# Patient Record
Sex: Female | Born: 1960 | Race: White | Hispanic: No | Smoking: Never smoker
Health system: Southern US, Community
[De-identification: ages and names within clinical notes are randomized; demographics above are authoritative.]

## PROBLEM LIST (undated history)

## (undated) DIAGNOSIS — M1990 Unspecified osteoarthritis, unspecified site: Secondary | ICD-10-CM

## (undated) DIAGNOSIS — K219 Gastro-esophageal reflux disease without esophagitis: Secondary | ICD-10-CM

## (undated) DIAGNOSIS — T781XXA Other adverse food reactions, not elsewhere classified, initial encounter: Secondary | ICD-10-CM

## (undated) DIAGNOSIS — J441 Chronic obstructive pulmonary disease with (acute) exacerbation: Secondary | ICD-10-CM

## (undated) DIAGNOSIS — J301 Allergic rhinitis due to pollen: Secondary | ICD-10-CM

## (undated) DIAGNOSIS — I10 Essential (primary) hypertension: Secondary | ICD-10-CM

## (undated) DIAGNOSIS — B029 Zoster without complications: Secondary | ICD-10-CM

## (undated) DIAGNOSIS — R21 Rash and other nonspecific skin eruption: Secondary | ICD-10-CM

## (undated) DIAGNOSIS — E039 Hypothyroidism, unspecified: Secondary | ICD-10-CM

## (undated) DIAGNOSIS — T7819XA Other adverse food reactions, not elsewhere classified, initial encounter: Secondary | ICD-10-CM

## (undated) DIAGNOSIS — M545 Low back pain, unspecified: Secondary | ICD-10-CM

## (undated) DIAGNOSIS — G4730 Sleep apnea, unspecified: Secondary | ICD-10-CM

## (undated) DIAGNOSIS — T783XXA Angioneurotic edema, initial encounter: Secondary | ICD-10-CM

## (undated) DIAGNOSIS — G2581 Restless legs syndrome: Secondary | ICD-10-CM

## (undated) DIAGNOSIS — M67441 Ganglion, right hand: Secondary | ICD-10-CM

## (undated) DIAGNOSIS — U071 COVID-19: Secondary | ICD-10-CM

## (undated) DIAGNOSIS — E7800 Pure hypercholesterolemia, unspecified: Secondary | ICD-10-CM

## (undated) DIAGNOSIS — K760 Fatty (change of) liver, not elsewhere classified: Secondary | ICD-10-CM

## (undated) DIAGNOSIS — H9190 Unspecified hearing loss, unspecified ear: Secondary | ICD-10-CM

## (undated) DIAGNOSIS — J45909 Unspecified asthma, uncomplicated: Secondary | ICD-10-CM

## (undated) DIAGNOSIS — I8391 Asymptomatic varicose veins of right lower extremity: Secondary | ICD-10-CM

## (undated) DIAGNOSIS — E669 Obesity, unspecified: Secondary | ICD-10-CM

## (undated) DIAGNOSIS — T782XXD Anaphylactic shock, unspecified, subsequent encounter: Secondary | ICD-10-CM

## (undated) HISTORY — DX: Rash and other nonspecific skin eruption: R21

## (undated) HISTORY — DX: Anaphylactic shock, unspecified, subsequent encounter: T78.2XXD

## (undated) HISTORY — PX: CHOLECYSTECTOMY: SHX55

## (undated) HISTORY — DX: Unspecified asthma, uncomplicated: J45.909

## (undated) HISTORY — DX: COVID-19: U07.1

## (undated) HISTORY — DX: Zoster without complications: B02.9

## (undated) HISTORY — PX: CYST REMOVAL LEG: SHX6280

## (undated) HISTORY — DX: Other adverse food reactions, not elsewhere classified, initial encounter: T78.1XXA

## (undated) HISTORY — DX: Ganglion, right hand: M67.441

## (undated) HISTORY — DX: Gastro-esophageal reflux disease without esophagitis: K21.9

## (undated) HISTORY — PX: TOTAL ABDOMINAL HYSTERECTOMY: SHX209

## (undated) HISTORY — DX: Fatty (change of) liver, not elsewhere classified: K76.0

## (undated) HISTORY — DX: Sleep apnea, unspecified: G47.30

## (undated) HISTORY — DX: Low back pain, unspecified: M54.50

## (undated) HISTORY — DX: Obesity, unspecified: E66.9

## (undated) HISTORY — DX: Angioneurotic edema, initial encounter: T78.3XXA

## (undated) HISTORY — DX: Essential (primary) hypertension: I10

## (undated) HISTORY — DX: Allergic rhinitis due to pollen: J30.1

## (undated) HISTORY — DX: Chronic obstructive pulmonary disease with (acute) exacerbation: J44.1

## (undated) HISTORY — DX: Other adverse food reactions, not elsewhere classified, initial encounter: T78.19XA

## (undated) HISTORY — DX: Asymptomatic varicose veins of right lower extremity: I83.91

## (undated) HISTORY — PX: ABDOMINAL HYSTERECTOMY: SHX81

## (undated) HISTORY — PX: MIDDLE EAR SURGERY: SHX713

---
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
## 1998-03-04 ENCOUNTER — Ambulatory Visit (HOSPITAL_COMMUNITY): Admission: RE | Admit: 1998-03-04 | Discharge: 1998-03-04 | Payer: Self-pay | Admitting: *Deleted

## 1998-03-04 ENCOUNTER — Encounter: Payer: Self-pay | Admitting: *Deleted

## 1998-03-08 ENCOUNTER — Ambulatory Visit (HOSPITAL_COMMUNITY): Admission: RE | Admit: 1998-03-08 | Discharge: 1998-03-08 | Payer: Self-pay | Admitting: Infectious Diseases

## 1998-03-08 ENCOUNTER — Encounter: Admission: RE | Admit: 1998-03-08 | Discharge: 1998-03-08 | Payer: Self-pay | Admitting: Infectious Diseases

## 1998-03-09 ENCOUNTER — Other Ambulatory Visit: Admission: RE | Admit: 1998-03-09 | Discharge: 1998-03-09 | Payer: Self-pay | Admitting: Otolaryngology

## 1998-03-11 ENCOUNTER — Ambulatory Visit (HOSPITAL_COMMUNITY): Admission: RE | Admit: 1998-03-11 | Discharge: 1998-03-11 | Payer: Self-pay | Admitting: Infectious Diseases

## 1998-03-11 ENCOUNTER — Encounter: Admission: RE | Admit: 1998-03-11 | Discharge: 1998-03-11 | Payer: Self-pay | Admitting: Infectious Diseases

## 1998-03-17 ENCOUNTER — Encounter: Admission: RE | Admit: 1998-03-17 | Discharge: 1998-03-17 | Payer: Self-pay | Admitting: Infectious Diseases

## 2001-01-18 ENCOUNTER — Emergency Department (HOSPITAL_COMMUNITY): Admission: EM | Admit: 2001-01-18 | Discharge: 2001-01-18 | Payer: Self-pay | Admitting: Emergency Medicine

## 2001-01-18 ENCOUNTER — Encounter: Payer: Self-pay | Admitting: Emergency Medicine

## 2001-05-14 ENCOUNTER — Ambulatory Visit (HOSPITAL_COMMUNITY): Admission: RE | Admit: 2001-05-14 | Discharge: 2001-05-14 | Payer: Self-pay | Admitting: Pulmonary Disease

## 2002-10-29 ENCOUNTER — Ambulatory Visit (HOSPITAL_COMMUNITY): Admission: RE | Admit: 2002-10-29 | Discharge: 2002-10-29 | Payer: Self-pay | Admitting: Pulmonary Disease

## 2003-01-18 ENCOUNTER — Ambulatory Visit: Admission: RE | Admit: 2003-01-18 | Discharge: 2003-01-18 | Payer: Self-pay | Admitting: Orthopedic Surgery

## 2003-01-18 ENCOUNTER — Encounter: Payer: Self-pay | Admitting: Orthopedic Surgery

## 2003-03-03 ENCOUNTER — Encounter: Payer: Self-pay | Admitting: Emergency Medicine

## 2003-03-03 ENCOUNTER — Emergency Department (HOSPITAL_COMMUNITY): Admission: EM | Admit: 2003-03-03 | Discharge: 2003-03-03 | Payer: Self-pay | Admitting: Emergency Medicine

## 2003-03-18 ENCOUNTER — Ambulatory Visit (HOSPITAL_COMMUNITY): Admission: RE | Admit: 2003-03-18 | Discharge: 2003-03-18 | Payer: Self-pay | Admitting: Pulmonary Disease

## 2003-09-29 ENCOUNTER — Ambulatory Visit (HOSPITAL_COMMUNITY): Admission: RE | Admit: 2003-09-29 | Discharge: 2003-09-29 | Payer: Self-pay | Admitting: Pulmonary Disease

## 2003-11-02 ENCOUNTER — Ambulatory Visit (HOSPITAL_COMMUNITY): Admission: RE | Admit: 2003-11-02 | Discharge: 2003-11-02 | Payer: Self-pay | Admitting: Pulmonary Disease

## 2004-02-26 ENCOUNTER — Emergency Department (HOSPITAL_COMMUNITY): Admission: EM | Admit: 2004-02-26 | Discharge: 2004-02-26 | Payer: Self-pay | Admitting: Emergency Medicine

## 2004-07-03 ENCOUNTER — Ambulatory Visit (HOSPITAL_COMMUNITY): Admission: RE | Admit: 2004-07-03 | Discharge: 2004-07-03 | Payer: Self-pay | Admitting: Pulmonary Disease

## 2004-11-03 ENCOUNTER — Ambulatory Visit (HOSPITAL_COMMUNITY): Admission: RE | Admit: 2004-11-03 | Discharge: 2004-11-03 | Payer: Self-pay | Admitting: Pulmonary Disease

## 2005-06-11 ENCOUNTER — Encounter: Payer: Self-pay | Admitting: Obstetrics and Gynecology

## 2005-06-11 ENCOUNTER — Inpatient Hospital Stay (HOSPITAL_COMMUNITY): Admission: RE | Admit: 2005-06-11 | Discharge: 2005-06-13 | Payer: Self-pay | Admitting: Obstetrics and Gynecology

## 2005-11-06 ENCOUNTER — Ambulatory Visit (HOSPITAL_COMMUNITY): Admission: RE | Admit: 2005-11-06 | Discharge: 2005-11-06 | Payer: Self-pay | Admitting: Pulmonary Disease

## 2006-06-09 ENCOUNTER — Emergency Department (HOSPITAL_COMMUNITY): Admission: EM | Admit: 2006-06-09 | Discharge: 2006-06-09 | Payer: Self-pay | Admitting: Emergency Medicine

## 2006-06-12 ENCOUNTER — Ambulatory Visit: Payer: Self-pay | Admitting: Orthopedic Surgery

## 2006-06-17 ENCOUNTER — Ambulatory Visit (HOSPITAL_COMMUNITY): Admission: RE | Admit: 2006-06-17 | Discharge: 2006-06-17 | Payer: Self-pay | Admitting: Orthopedic Surgery

## 2006-06-24 ENCOUNTER — Ambulatory Visit: Payer: Self-pay | Admitting: Orthopedic Surgery

## 2006-07-26 ENCOUNTER — Ambulatory Visit: Payer: Self-pay | Admitting: Orthopedic Surgery

## 2006-07-26 ENCOUNTER — Ambulatory Visit (HOSPITAL_COMMUNITY): Admission: RE | Admit: 2006-07-26 | Discharge: 2006-07-26 | Payer: Self-pay | Admitting: Orthopedic Surgery

## 2006-07-26 ENCOUNTER — Encounter (INDEPENDENT_AMBULATORY_CARE_PROVIDER_SITE_OTHER): Payer: Self-pay | Admitting: Specialist

## 2006-07-29 ENCOUNTER — Ambulatory Visit: Payer: Self-pay | Admitting: Orthopedic Surgery

## 2006-08-06 ENCOUNTER — Ambulatory Visit: Payer: Self-pay | Admitting: Orthopedic Surgery

## 2006-11-11 ENCOUNTER — Ambulatory Visit (HOSPITAL_COMMUNITY): Admission: RE | Admit: 2006-11-11 | Discharge: 2006-11-11 | Payer: Self-pay | Admitting: Pulmonary Disease

## 2007-02-28 ENCOUNTER — Ambulatory Visit (HOSPITAL_COMMUNITY): Admission: RE | Admit: 2007-02-28 | Discharge: 2007-02-28 | Payer: Self-pay | Admitting: Pulmonary Disease

## 2007-11-11 ENCOUNTER — Emergency Department (HOSPITAL_COMMUNITY): Admission: EM | Admit: 2007-11-11 | Discharge: 2007-11-11 | Payer: Self-pay | Admitting: Emergency Medicine

## 2007-11-12 ENCOUNTER — Ambulatory Visit (HOSPITAL_COMMUNITY): Admission: RE | Admit: 2007-11-12 | Discharge: 2007-11-12 | Payer: Self-pay | Admitting: Pulmonary Disease

## 2008-08-27 ENCOUNTER — Encounter: Payer: Self-pay | Admitting: Orthopedic Surgery

## 2008-09-14 ENCOUNTER — Ambulatory Visit (HOSPITAL_COMMUNITY): Admission: RE | Admit: 2008-09-14 | Discharge: 2008-09-14 | Payer: Self-pay | Admitting: Pulmonary Disease

## 2008-11-15 ENCOUNTER — Ambulatory Visit (HOSPITAL_COMMUNITY): Admission: RE | Admit: 2008-11-15 | Discharge: 2008-11-15 | Payer: Self-pay

## 2008-11-25 ENCOUNTER — Emergency Department (HOSPITAL_COMMUNITY): Admission: EM | Admit: 2008-11-25 | Discharge: 2008-11-25 | Payer: Self-pay | Admitting: Emergency Medicine

## 2008-12-09 ENCOUNTER — Emergency Department (HOSPITAL_COMMUNITY): Admission: EM | Admit: 2008-12-09 | Discharge: 2008-12-10 | Payer: Self-pay | Admitting: Emergency Medicine

## 2009-06-12 ENCOUNTER — Emergency Department (HOSPITAL_COMMUNITY): Admission: EM | Admit: 2009-06-12 | Discharge: 2009-06-12 | Payer: Self-pay | Admitting: Emergency Medicine

## 2009-06-13 ENCOUNTER — Ambulatory Visit (HOSPITAL_COMMUNITY): Admission: RE | Admit: 2009-06-13 | Discharge: 2009-06-13 | Payer: Self-pay | Admitting: Pulmonary Disease

## 2009-11-01 ENCOUNTER — Emergency Department (HOSPITAL_COMMUNITY): Admission: EM | Admit: 2009-11-01 | Discharge: 2009-11-01 | Payer: Self-pay | Admitting: Emergency Medicine

## 2009-11-17 ENCOUNTER — Ambulatory Visit (HOSPITAL_COMMUNITY): Admission: RE | Admit: 2009-11-17 | Discharge: 2009-11-17 | Payer: Self-pay | Admitting: Pulmonary Disease

## 2010-03-01 ENCOUNTER — Emergency Department (HOSPITAL_COMMUNITY): Admission: EM | Admit: 2010-03-01 | Discharge: 2010-03-02 | Payer: Self-pay | Admitting: Emergency Medicine

## 2010-05-15 ENCOUNTER — Ambulatory Visit (HOSPITAL_COMMUNITY): Admission: RE | Admit: 2010-05-15 | Payer: Self-pay | Source: Home / Self Care | Admitting: Pulmonary Disease

## 2010-06-25 ENCOUNTER — Encounter: Payer: Self-pay | Admitting: Pulmonary Disease

## 2010-08-17 LAB — DIFFERENTIAL
Basophils Relative: 0 % (ref 0–1)
Eosinophils Absolute: 0.1 10*3/uL (ref 0.0–0.7)
Lymphocytes Relative: 28 % (ref 12–46)
Lymphs Abs: 1.6 10*3/uL (ref 0.7–4.0)
Monocytes Absolute: 0.3 10*3/uL (ref 0.1–1.0)
Monocytes Relative: 6 % (ref 3–12)
Neutro Abs: 3.9 10*3/uL (ref 1.7–7.7)
Neutrophils Relative %: 65 % (ref 43–77)

## 2010-08-17 LAB — CBC
Hemoglobin: 13.3 g/dL (ref 12.0–15.0)
MCHC: 33.3 g/dL (ref 30.0–36.0)
Platelets: 219 10*3/uL (ref 150–400)
RBC: 4.56 MIL/uL (ref 3.87–5.11)
RDW: 14.6 % (ref 11.5–15.5)
WBC: 6 10*3/uL (ref 4.0–10.5)

## 2010-08-17 LAB — BASIC METABOLIC PANEL
CO2: 27 mEq/L (ref 19–32)
Calcium: 9.6 mg/dL (ref 8.4–10.5)
GFR calc Af Amer: >60 mL/min (ref >60)
GFR calc non Af Amer: >60 mL/min (ref >60)
Potassium: 3.9 mEq/L (ref 3.5–5.1)

## 2010-09-10 LAB — DIFFERENTIAL
Eosinophils Absolute: 0.1 10*3/uL (ref 0.0–0.7)
Lymphocytes Relative: 28 % (ref 12–46)
Monocytes Relative: 6 % (ref 3–12)
Neutro Abs: 3.9 10*3/uL (ref 1.7–7.7)

## 2010-09-10 LAB — POCT CARDIAC MARKERS: Troponin i, poc: <0.05 ng/mL (ref 0.00–0.09)

## 2010-09-10 LAB — CBC
MCV: 87.5 fL (ref 78.0–100.0)
RDW: 14.6 % (ref 11.5–15.5)
WBC: 6 10*3/uL (ref 4.0–10.5)

## 2010-09-10 LAB — BASIC METABOLIC PANEL: GFR calc Af Amer: >60 mL/min (ref >60)

## 2010-09-11 LAB — BASIC METABOLIC PANEL
BUN: 12 mg/dL (ref 6–23)
CO2: 28 mEq/L (ref 19–32)
Calcium: 9.3 mg/dL (ref 8.4–10.5)
Chloride: 103 mEq/L (ref 96–112)
Creatinine, Ser: 0.72 mg/dL (ref 0.4–1.2)
GFR calc Af Amer: >60 mL/min (ref >60)
Sodium: 136 mEq/L (ref 135–145)

## 2010-09-11 LAB — CBC
HCT: 39.5 % (ref 36.0–46.0)
Hemoglobin: 13.5 g/dL (ref 12.0–15.0)
MCHC: 34.3 g/dL (ref 30.0–36.0)
MCV: 88 fL (ref 78.0–100.0)
RBC: 4.49 MIL/uL (ref 3.87–5.11)
RDW: 14.6 % (ref 11.5–15.5)
WBC: 5.3 10*3/uL (ref 4.0–10.5)

## 2010-09-11 LAB — URINALYSIS, ROUTINE W REFLEX MICROSCOPIC
Bilirubin Urine: NEGATIVE
Hgb urine dipstick: NEGATIVE
Urobilinogen, UA: 0.2 mg/dL (ref 0.0–1.0)
pH: 6.5 (ref 5.0–8.0)

## 2010-09-11 LAB — DIFFERENTIAL
Basophils Absolute: 0 10*3/uL (ref 0.0–0.1)
Basophils Relative: 0 % (ref 0–1)
Eosinophils Absolute: 0.1 10*3/uL (ref 0.0–0.7)
Eosinophils Relative: 1 % (ref 0–5)
Lymphocytes Relative: 24 % (ref 12–46)
Lymphs Abs: 1.3 10*3/uL (ref 0.7–4.0)
Monocytes Absolute: 0.4 10*3/uL (ref 0.1–1.0)
Monocytes Relative: 8 % (ref 3–12)

## 2010-09-26 ENCOUNTER — Other Ambulatory Visit (HOSPITAL_COMMUNITY): Payer: Self-pay | Admitting: Pulmonary Disease

## 2010-09-26 DIAGNOSIS — R221 Localized swelling, mass and lump, neck: Secondary | ICD-10-CM

## 2010-10-02 ENCOUNTER — Ambulatory Visit (HOSPITAL_COMMUNITY)
Admission: RE | Admit: 2010-10-02 | Discharge: 2010-10-02 | Disposition: A | Discharge status: Home / Self Care | Payer: Medicaid Other | Source: Ambulatory Visit | Attending: Pulmonary Disease | Admitting: Pulmonary Disease

## 2010-10-02 ENCOUNTER — Encounter (HOSPITAL_COMMUNITY): Payer: Self-pay

## 2010-10-02 DIAGNOSIS — R221 Localized swelling, mass and lump, neck: Secondary | ICD-10-CM

## 2010-10-02 DIAGNOSIS — R220 Localized swelling, mass and lump, head: Secondary | ICD-10-CM | POA: Insufficient documentation

## 2010-10-02 HISTORY — DX: Essential (primary) hypertension: I10

## 2010-10-02 MED ORDER — IOHEXOL 300 MG/ML  SOLN
75.0000 mL | Freq: Once | INTRAMUSCULAR | Status: AC | PRN
  Administered 2010-10-02: 75 mL via INTRAVENOUS

## 2010-10-16 ENCOUNTER — Other Ambulatory Visit (HOSPITAL_COMMUNITY): Payer: Self-pay | Admitting: Pulmonary Disease

## 2010-10-16 DIAGNOSIS — Z139 Encounter for screening, unspecified: Secondary | ICD-10-CM

## 2010-10-20 NOTE — Discharge Summary (Signed)
[name], Sandy Valencia                ACCOUNT NO.:  [id number]   MEDICAL RECORD NO.:  [id number]          PATIENT TYPE:  INP   LOCATION:  A418                          FACILITY:  [facility]   PHYSICIAN:  Tilda Burrow, M.D. DATE OF BIRTH:  1960-12-28   DATE OF ADMISSION:  06/11/2005  DATE OF DISCHARGE:  01/10/2007LH                                 DISCHARGE SUMMARY   ADMISSION DIAGNOSES:  1.  Uterine fibroids.  2.  Menorrhagia.  3.  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  4.  Asthma.  5.  GERD.   DISCHARGE DIAGNOSES:  1.  Uterine fibroids.  2.  Menorrhagia.  3.  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  4.  Asthma.  5.  GERD.  6.  Bilateral ovarian tumor mucinous cyst adenoma.   PROCEDURES:  On June 11, 2005, total abdominal hysterectomy,  panniculectomy with repositioning of the umbilicus.   DISCHARGE MEDICATIONS:  1.  Actos 30 mg one p.o. daily.  2.  Zantac 30 mg p.o. daily.  3.  Glucovance one p.o. daily.  Check blood sugars twice daily.   FOLLOWUP:  Follow up with Dr. Juanetta Gosling in one week and  in our office in one  week.   HOSPITAL SUMMARY:  This 50 year old female diabetic with diabetes and  hypertension in the past having lost 40 pounds while preparing for surgery  is admitted for hysterectomy and panniculectomy.   HOSPITAL COURSE:  The patient was admitted, underwent hysterectomy with  removal of cervix without complications.  She was managed postoperatively  with sliding scale insulin as per diabetic protocol.  She has had  Flowtron__anti thrombus Compression _ devices in place.  She had a  relatively straightforward postoperative course.  She was begun on Lantus 20  units at bedtime on postop day #1.  She was discharged on postop day #10,  managed by Dr. Juanetta Gosling for her diabetes consisting of return to oral agents  Actos and Glucovance.  Follow up is in one week in our office for staple  removal and evaluation of drains.   ADDENDUM:  Pathology report showed a 430 g uterus with 68 g tubes and  ovaries.  The anterior abdominal wall section measured 60x23x7.5 cm full  thickness skin ellipsis with underlying subcutaneous tissue.  The weight was  4300 g after fixation.  Pathology report showed only mild dysplasia of the  cervix, bilateral mucinous cyst adenomatous of the ovaries.      Tilda Burrow, M.D.  Electronically Signed     JVF/MEDQ  D:  07/04/2005  T:  07/05/2005  Job:  [id number]   cc:   Ramon Dredge L. Juanetta Gosling, M.D.  Fax: [phone]

## 2010-10-20 NOTE — H&P (Signed)
Sandy Valencia, TSCHIRHART                ACCOUNT NO.:  [id number]   MEDICAL RECORD NO.:  [id number]          PATIENT TYPE:  AMB   LOCATION:  DAY                           FACILITY:  [facility]   PHYSICIAN:  Vickki Hearing, M.D.DATE OF BIRTH:  March 17, 1961   DATE OF ADMISSION:  DATE OF DISCHARGE:  LH                              HISTORY & PHYSICAL   CHIEF COMPLAINT:  Mass, left foot.   HISTORY OF PRESENT ILLNESS:  This is a 50 year old female who has a  painful mass on the dorsum of the left foot for approximately a month  and half.  An MRI was done to evaluate and it was thought to be a  ganglion.  The patient has trouble wearing shoes, swelling and she  walks.  The cyst appears to be coming out of the tarsal sinus along the  talar head.  No malignancy identified.   REVIEW OF SYSTEMS:  Review of systems times 10 is normal.   PAST MEDICAL HISTORY:  No major illnesses.  She has had ear surgery,  cholecystectomy, tubal ligation, a wart removed from her tongue.   MEDICATIONS:  Her medications are Singulair, Avapro, Glucovance, Zantac,  Xenical, baby aspirin.   FAMILY HISTORY:  Heart, lung disease, asthma, arthritis, cancer.   SOCIAL HISTORY:  She is single, does not smoke or drink, completed  grades 1-12.   PHYSICAL EXAMINATION:  VITAL SIGNS:  Vital signs will be recorded at the  time of her preoperative evaluation.  CONSTITUTIONAL:  Findings include normal development, nutrition,  grooming, hygiene normal.  Body habitus moderate size.  EXTREMITIES:  Peripheral pulses are normal.  Good perfusion. Skin is  normal except for the mass see below.  Range of motion of the foot is  normal.  There is no muscle atrophy.  Good muscle tone.  No instability  at the ankle.  On the dorsal lateral side of the foot there is a mass  palpable under the skin, tender, mobile and soft.  NEUROPSYCH:  Normal sensation.  She is alert and oriented x3.   CLINICAL DATA:  MRI findings as stated.   IMPRESSION:   Dorsal ganglion left foot.   PLAN:  Excision of mass, left foot.      Vickki Hearing, M.D.  Electronically Signed     SEH/MEDQ  D:  07/25/2006  T:  07/25/2006  Job:  [id number]   cc:   [facility]  Fax: [phone]

## 2010-10-20 NOTE — H&P (Signed)
Sandy Valencia, Sandy Valencia                ACCOUNT NO.:  [id number]   MEDICAL RECORD NO.:  [id number]          PATIENT TYPE:  AMB   LOCATION:  DAY                           FACILITY:  [facility]   PHYSICIAN:  Tilda Burrow, M.D. DATE OF BIRTH:  Feb 16, 1961   DATE OF ADMISSION:  DATE OF DISCHARGE:  LH                                HISTORY & PHYSICAL   ADMISSION DIAGNOSES:  1.  Uterine fibroids, menorrhagia secondary to uterine fibroids.  2.  Obesity with panniculus, status post 40-pound weight loss.  3.  Asthma.  4.  Gastroesophageal reflux disease.   HISTORY OF PRESENT ILLNESS:  This 50 year old female is admitted at this  time for hysterectomy with panniculectomy and relocation of the naval. Sandy Valencia  has a huge fibroid deforming the uterine cavity. She is not a candidate for  vaginal procedure. She has lost approximately 40 pounds doing an exercise  program, taking her weight from 250 to 213. She is at this time scheduled  for hysterectomy. We will at that time additionally be able to do a  panniculectomy. We have sketched out the extent of the panniculectomy which  will be quite generous, removing the bulk of the skin and underlying fatty  tissue from the naval to the lower abdominal crease. Sandy Valencia is aware that  there is an increased risk of infection with larger incisions and  acknowledged this as an inherent part of the risk of the surgery, and she  wishes to proceed nonetheless. Her diabetes has been under excellent control  with hemoglobin A1c in the 6 to 7 range.   PAST MEDICAL HISTORY:  Positive for diabetes and hypertension managed by Dr.  Juanetta Gosling.   PAST SURGICAL HISTORY:  1.  Ear surgery 1992 and 1995.  2.  Tubal ligation in 1986.  3.  Cholecystectomy in 1995.   ALLERGIES:  None.   MEDICATIONS:  1.  Actos 30 mg 1 p.o. daily.  2.  Zantac twice daily.  3.  Singulair 10 mg daily.   Pharmacy is [facility].   PHYSICAL EXAMINATION:  GENERAL:  Shows a healthy appearing  Caucasian female,  highly energetic, with multiple questions which have been answered to  patient's satisfaction.  HEENT:  Pupils are equal, round, and reactive. Extraocular movements intact.  NECK:  Supple. Normal thyroid.  CARDIOVASCULAR:  Unremarkable.  BREASTS:  Negative at last check.  ABDOMEN:  Obese with lax skin. No hernia on her abdominal check. She has a  two-roll panniculus. Plans are to relocate the navel from the lower portion  of the upper roll to the upper crease, a more physiologic appearing position  once the lower roll is excised.   External genitalia multiparous. Uterus anterior with 7.8 x 7.5 cm fibroid,  increasing in size since last year. Adnexa are nontender without masses. On  ultrasound, there is no enlargement, so plans are to preserve the ovaries.  Rectal support is adequate.   GC and chlamydia culture have been done May 31, 2005. Pap smear as  well.   PLAN:  Abdominal hysterectomy with ovarian presentation, removing the  cervix,  as well as panniculectomy with naval recollection on June 07, 2004.      Tilda Burrow, M.D.  Electronically Signed     JVF/MEDQ  D:  05/31/2005  T:  05/31/2005  Job:  [id number]   cc:   Ramon Dredge L. Juanetta Gosling, M.D.  Fax: [phone]

## 2010-10-20 NOTE — Op Note (Signed)
NAMESAGRARIO, LINEBERRY                ACCOUNT NO.:  [id number]   MEDICAL RECORD NO.:  [id number]          PATIENT TYPE:  AMB   LOCATION:  DAY                           FACILITY:  [facility]   PHYSICIAN:  Vickki Hearing, M.D.DATE OF BIRTH:  Feb 26, 1961   DATE OF PROCEDURE:  07/26/2006  DATE OF DISCHARGE:                               OPERATIVE REPORT   PREOPERATIVE DIAGNOSIS:  Ganglion cyst left foot.   POSTOPERATIVE DIAGNOSIS:  Ganglion cyst left foot.   PROCEDURE:  Excision of ganglion left foot.   SURGEON:  Vickki Hearing, M.D.   ASSISTANT:  No assistants.   ANESTHETIC:  MAC with local lidocaine 1% plain.   OPERATIVE FINDINGS:  Ganglion cyst coming from the calcaneocuboid joint.   SPECIMEN:  One sent to pathology as a ganglion.   INDICATIONS FOR PROCEDURE:  Persistent pain and trouble wearing shoes.   The patient was identified in the preop holding area as Sandy Valencia.  She marked the mass is the surgical site, countersigned by the surgeon.  Antibiotics were started.  History and physical was updated.  The  patient was taken to the operating room given IV sedation and then left  foot was prepped and draped using sterile technique.  Block was done  using 1% plain lidocaine total of 30 mL.  We first started with a  superficial peroneal nerve block then a local block in a V-shaped  fashion.  We allowed this to set up and then elevated the tourniquet to  300 mmHg made a transverse incision in Langer's lines over the mass,  divided the subcu tissue down to the mass.  We bluntly dissected around  the mass and then excised it and took a little bit of bone from each  side of the calcaneocuboid joint, irrigated and closed with 3-0 nylon,  wrapped a sterile dressing around the foot and then took the patient  back to recovery room in stable condition.  She will wear a Darco shoe,  weightbearing as tolerated.  Follow-up on Monday.  Discharge on Lortab.      Vickki Hearing,  M.D.  Electronically Signed    SEH/MEDQ  D:  07/26/2006  T:  07/26/2006  Job:  [id number]

## 2010-10-20 NOTE — Consult Note (Signed)
Sandy Valencia, Sandy Valencia                ACCOUNT NO.:  [id number]   MEDICAL RECORD NO.:  [id number]          PATIENT TYPE:  INP   LOCATION:  A418                          FACILITY:  [facility]   PHYSICIAN:  Edward L. Juanetta Gosling, M.D.DATE OF BIRTH:  1960/07/29   DATE OF CONSULTATION:  06/13/2005  DATE OF DISCHARGE:  06/13/2005                                   CONSULTATION   MEDICINE CONSULTATION:   HISTORY:  Ms. Tech is a 50 year old who underwent hysterectomy and  abdominoplasty and had done fairly well with that. However, her hemoglobin  A1c when she came in the hospital was 8. She has recently been dieting, has  lost about 30 pounds and thought that her blood sugar was good enough that  she could come off of her medication. She had been on Actos 30 mg daily plus  on Glucovance 5/500 four daily. She stopped all of these except she was  taking her Actos p.r.n. In addition to that she has a history of  hypertension for which she had been on Avapro 150 mg daily, asthma using  albuterol and Atrovent as needed and Singulair as needed but she really has  done very well with her asthma, chronic low back pain was unchanged and then  problems with her pelvis that Dr. Emelda Fear has done surgery for. Her past  medical history otherwise pretty much negative.   SOCIAL HISTORY:  She has a previous history of smoking. She does not smoke  now. She does not drink any alcohol.   FAMILY HISTORY:  Her family history is very positive for diabetes, COPD,  hypertension, and coronary artery occlusive disease.   REVIEW OF SYSTEMS:  Her review of systems except as mentioned is negative.   PHYSICAL EXAMINATION:  GENERAL:  Well-developed, well-nourished female who  is in no acute distress right now.  CHEST:  Clear.  HEART:  Regular.  ABDOMEN:  Soft.  EXTREMITIES:  No edema.  CNS:  Grossly intact.  VITALS:  Her vitals are as recorded.  HEENT:  Her nose and throat are clear. Mucous membranes are moist.   ASSESSMENT:  She has significant problems with her blood sugar and at this  point she is going to need to get back on medications. I think we can cut  the dose down on her medications and I am going to put her on the Glucovance  but only 5/500 once a day. I am going to have her take Actos 30 mg daily and  I am going to have her follow up in  my office in about 6 weeks for reevaluation. I am hopeful that she may be  able to come off some of the medication at that point but it is not totally  clear that will be possible considering that she has been working very hard  at diet and exercise and she still has a hemoglobin A1c that is 8. Thanks  again for allowing me to see her with you.      Edward L. Juanetta Gosling, M.D.  Electronically Signed     ELH/MEDQ  D:  06/13/2005  T:  06/13/2005  Job:  [id number]

## 2010-10-20 NOTE — Op Note (Signed)
Sandy Valencia, Sandy Valencia                ACCOUNT NO.:  [id number]   MEDICAL RECORD NO.:  [id number]          PATIENT TYPE:  INP   LOCATION:  A418                          FACILITY:  [facility]   PHYSICIAN:  Tilda Burrow, M.D. DATE OF BIRTH:  Feb 14, 1961   DATE OF PROCEDURE:  06/11/2005  DATE OF DISCHARGE:                                 OPERATIVE REPORT   PREOPERATIVE DIAGNOSES:  1.  Uterine fibroids with heavy menses.  2.  Obesity with panniculus.   POSTOPERATIVE DIAGNOSES:  1.  Uterine fibroids with heavy menses.  2.  Obesity with panniculus.  3.  Left ovarian fibroma.   PROCEDURE:  Total abdominal hysterectomy, bilateral salpingo-oophorectomy,  panniculectomy with repositioning of the naval.   SURGEON:  Dr. Emelda Fear.   ASSISTANT:  Amie Critchley, C.S.T.-F.A., Morrie Sheldon, R.N.   ANESTHESIA:  General.   COMPLICATIONS:  None.   FINDINGS:  1.  Marked abdominal laxity with redundant skin and fatty tissue.  2.  Small amount of anterior omental adhesions, prior lower abdominal      surgical site.  3.  Large irregular left ovary felt to represent an old fibroma and a      suspected smaller fibroma on the right ovary, warranting removal of both      ovaries and tubes. Evidence of prior tubal sterilization.   DETAILS OF PROCEDURE:  The patient was taken to the operating room and  prepped and draped for lower abdominal surgery. Foley catheter was in place.  Vaginal area had been prepped, and legs were in the supine position with  Flowtron leg massagers in place. The skin had been previously been marked in  the preoperative area with patient's agreement, with plans to remove a major  portion of the redundant fat and the panniculus roll in the lower abdomen.  The lower portion of the incision was first performed, staying approximately  5 cm above the inguinal crease on either side and removing from the mons  pubis, excising the left one half of the lower portion of the incision, then  undermining  the fatty tissue, leaving a thin layer of fatty tissue over the  fascia. The incision reached all the way to the posterior superior iliac  crest and marched across the removed the upper one third of the mons pubis  fat and skin as well. Once it had been adequately undermined, it could be  pulled down. The upper aspect of the incision was removed, removing  approximately 80-cm long ellipse of skin and fatty tissue. The upper  incision was approximately 8 cm below the umbilicus. Once the specimen had  been completely removed with careful attention to point cautery as necessary  and ligature around all arterial bleeders that could be identified, we were  able to reapproximate the lateral one third of the incision on each side,  pulling it together with flat JP drains just above the fascia and 2-0 plain  sutures pulling the subcu tissues. Staple closure of the skin was performed  on the lateral third of the incision. The middle portion, from anterior to  superior iliac crest on  each side to the midline was left open and a Pelosi  incision into the abdominal cavity performed. The patient had some thin  adhesions to the omentum and elevated the abdominal contents and packed it  away with Balfour retractor. The uterus was large, irregular. We then took  down the round ligaments bilaterally, inspected the left ovary was large,  irregular, and had a suspected fibroma within it. It was not felt that  preservation was indicated, so therefore, the infundibulopelvic ligament was  identified, clamped, cut and suture ligated. Bladder flap was mobilized  anteriorly. Right side was treated similarly, but we kept the preserved  ovary on the right initially. Bladder flap required a lot of peeling to  expose the lower uterine segment. We then skeletonized the uterine vessels  on either side, doubly cross clamping them with a Kelly clamp used to  control back bleeding. Uterine vessels were doubly ligated. Upper  and lower  cardinal ligaments were then clamped, cut and suture ligated. The uterine  fundus was amputated off of the lower uterine segment as soon as the uterine  vessels were doubly ligated. We marched down to the edge of the cuff, stab  incision was made in the anterior cervical vaginal fornix and the cervical  stump removed off of the vaginal cuff. Four Kocher clamps were used to hold  the vaginal cuff corners, and then we placed Aldrich stitches at each  lateral vaginal angle and then closed the cuff in a continuous running  fashion in the midline. Hemostasis was satisfactory except at the right  ovary. Palpation of the ovary identified a small, very hard nodule within it  suspected to represent a fibroma on that side as well. Given its atypical  appearance, we felt that excision was warranted, and so bilateral salpingo-  oophorectomy was completed by cross clamping beneath the ovary across the  infundibulopelvic ligament. We stayed well away from the pelvic side wall,  so the ureters were well out of the surgical arena. Pelvis was irrigated.  Hemostasis confirmed and laparotomy equipment removed and anterior  peritoneum closed. The fascia was closed in the midline with 0 Prolene  suture. Subcu fatty tissues were inspected and irrigated once again using  antibiotic-containing solution and pulled downward. There was a little bit  of downward resistance from the umbilicus, and so we relocated the  umbilicus, coring it out around it and imbricating it and then pulling it  out and repositioning it approximately 8 cm more cephalad. This was sutured  in place with 2-0 plain, and subcuticular 4-0 Dexon used to reapproximate  the umbilicus in its anatomic position. The umbilical donor site was closed  in the midline with good staple closure. The mid portion of the incision was  closed with two layered subcutaneous closure and staple closure of the skin. Subcu drains were allowed to exit  through separate skin incisions just  inside the inferior superior iliac crest. The patient tolerated the  procedure well with 500 cc of blood loss.      Tilda Burrow, M.D.  Electronically Signed     JVF/MEDQ  D:  06/11/2005  T:  06/11/2005  Job:  [id number]

## 2010-11-20 ENCOUNTER — Ambulatory Visit (HOSPITAL_COMMUNITY)
Admission: RE | Admit: 2010-11-20 | Discharge: 2010-11-20 | Disposition: A | Discharge status: Home / Self Care | Payer: Medicaid Other | Source: Ambulatory Visit | Attending: Pulmonary Disease | Admitting: Pulmonary Disease

## 2010-11-20 DIAGNOSIS — Z139 Encounter for screening, unspecified: Secondary | ICD-10-CM

## 2010-11-20 DIAGNOSIS — Z1231 Encounter for screening mammogram for malignant neoplasm of breast: Secondary | ICD-10-CM | POA: Insufficient documentation

## 2011-06-11 ENCOUNTER — Ambulatory Visit (HOSPITAL_COMMUNITY)
Admission: RE | Admit: 2011-06-11 | Discharge: 2011-06-11 | Disposition: A | Discharge status: Home / Self Care | Payer: Medicaid Other | Source: Ambulatory Visit | Attending: Pulmonary Disease | Admitting: Pulmonary Disease

## 2011-06-11 ENCOUNTER — Other Ambulatory Visit (HOSPITAL_COMMUNITY): Payer: Self-pay | Admitting: Pulmonary Disease

## 2011-06-11 DIAGNOSIS — M7730 Calcaneal spur, unspecified foot: Secondary | ICD-10-CM | POA: Insufficient documentation

## 2011-06-11 DIAGNOSIS — M25572 Pain in left ankle and joints of left foot: Secondary | ICD-10-CM

## 2011-06-11 DIAGNOSIS — M79672 Pain in left foot: Secondary | ICD-10-CM

## 2011-06-11 DIAGNOSIS — M25579 Pain in unspecified ankle and joints of unspecified foot: Secondary | ICD-10-CM | POA: Insufficient documentation

## 2011-10-15 ENCOUNTER — Other Ambulatory Visit (HOSPITAL_COMMUNITY): Payer: Self-pay | Admitting: Pulmonary Disease

## 2011-10-15 DIAGNOSIS — Z139 Encounter for screening, unspecified: Secondary | ICD-10-CM

## 2011-11-26 ENCOUNTER — Ambulatory Visit (HOSPITAL_COMMUNITY)
Admission: RE | Admit: 2011-11-26 | Discharge: 2011-11-26 | Disposition: A | Discharge status: Home / Self Care | Payer: Medicaid Other | Source: Ambulatory Visit | Attending: Pulmonary Disease | Admitting: Pulmonary Disease

## 2011-11-26 DIAGNOSIS — Z139 Encounter for screening, unspecified: Secondary | ICD-10-CM

## 2011-11-26 DIAGNOSIS — Z1231 Encounter for screening mammogram for malignant neoplasm of breast: Secondary | ICD-10-CM | POA: Insufficient documentation

## 2011-12-10 ENCOUNTER — Encounter (HOSPITAL_COMMUNITY): Payer: Self-pay | Admitting: *Deleted

## 2011-12-10 ENCOUNTER — Emergency Department (HOSPITAL_COMMUNITY)
Admission: EM | Admit: 2011-12-10 | Discharge: 2011-12-10 | Disposition: A | Discharge status: Home / Self Care | Payer: Medicaid Other | Attending: Emergency Medicine | Admitting: Emergency Medicine

## 2011-12-10 DIAGNOSIS — Z794 Long term (current) use of insulin: Secondary | ICD-10-CM | POA: Insufficient documentation

## 2011-12-10 DIAGNOSIS — S61209A Unspecified open wound of unspecified finger without damage to nail, initial encounter: Secondary | ICD-10-CM | POA: Insufficient documentation

## 2011-12-10 DIAGNOSIS — IMO0002 Reserved for concepts with insufficient information to code with codable children: Secondary | ICD-10-CM

## 2011-12-10 DIAGNOSIS — W260XXA Contact with knife, initial encounter: Secondary | ICD-10-CM | POA: Insufficient documentation

## 2011-12-10 DIAGNOSIS — Z7982 Long term (current) use of aspirin: Secondary | ICD-10-CM | POA: Insufficient documentation

## 2011-12-10 DIAGNOSIS — E119 Type 2 diabetes mellitus without complications: Secondary | ICD-10-CM | POA: Insufficient documentation

## 2011-12-10 DIAGNOSIS — Z90710 Acquired absence of both cervix and uterus: Secondary | ICD-10-CM | POA: Insufficient documentation

## 2011-12-10 MED ORDER — TETANUS-DIPHTH-ACELL PERTUSSIS 5-2.5-18.5 LF-MCG/0.5 IM SUSP
0.5000 mL | Freq: Once | INTRAMUSCULAR | Status: DC
  Filled 2011-12-10: qty 0.5

## 2011-12-10 NOTE — ED Notes (Signed)
Cut with fillet knife RMF

## 2011-12-10 NOTE — ED Provider Notes (Addendum)
History     CSN: [id number]  Arrival date & time 12/10/11  2043   First MD Initiated Contact with Patient 12/10/11 2115      Chief Complaint  Patient presents with  . Laceration    (Consider location/radiation/quality/duration/timing/severity/associated sxs/prior treatment) HPI Comments: Patient was opening a new fillet knife when she cut her right middle finger. The patient states she thinks she's had a tetanus shot within the last 5 years. She denies being on any blood thinning agents, and denies having a bleeding disorders.  Patient is a 51 y.o. female presenting with skin laceration. The history is provided by the patient.  Laceration     Past Medical History  Diagnosis Date  . Diabetes mellitus     Past Surgical History  Procedure Date  . Abdominal hysterectomy   . Cholecystectomy   . Middle ear surgery     History reviewed. No pertinent family history.  History  Substance Use Topics  . Smoking status: Never Smoker   . Smokeless tobacco: Not on file  . Alcohol Use: No    OB History    Grav Para Term Preterm Abortions TAB SAB Ect Mult Living                  Review of Systems  Constitutional: Negative for activity change.       All ROS Neg except as noted in HPI  HENT: Negative for nosebleeds and neck pain.   Eyes: Negative for photophobia and discharge.  Respiratory: Negative for cough, shortness of breath and wheezing.   Cardiovascular: Negative for chest pain and palpitations.  Gastrointestinal: Negative for abdominal pain and blood in stool.  Genitourinary: Negative for dysuria, frequency and hematuria.  Musculoskeletal: Negative for back pain and arthralgias.  Skin: Negative.   Neurological: Negative for dizziness, seizures and speech difficulty.  Psychiatric/Behavioral: Negative for hallucinations and confusion.    Allergies  Review of patient's allergies indicates no known allergies.  Home Medications   Current Outpatient Rx  Name Route  Sig Dispense Refill  . ASPIRIN EC 81 MG PO TBEC Oral Take 81 mg by mouth daily.    Marland Kitchen QVAR IN Inhalation Inhale 1-2 puffs into the lungs daily as needed.    Marland Kitchen HYDROCODONE-ACETAMINOPHEN 5-500 MG PO TABS Oral Take 1 tablet by mouth 4 (four) times daily as needed. For pain    . INSULIN GLARGINE 100 UNIT/ML [place] SOLN Subcutaneous Inject 60 Units into the skin 2 (two) times daily.    . ADULT MULTIVITAMIN W/MINERALS CH Oral Take 1 tablet by mouth daily.    Marland Kitchen NOVOLOG FLEXPEN 100 UNIT/ML [place] SOLN Subcutaneous Inject 30 Units into the skin Three times a day. **Inject 30 units subcutaneously 3 times daily. If sugar levels are elevated, use sliding scale in addition to 30 units.**    . OMEPRAZOLE 20 MG PO CPDR Oral Take 1 capsule by mouth Daily.      BP 167/82  Pulse 83  Temp 97.7 F (36.5 C) (Oral)  Resp 20  Ht 5\' 5"  (1.651 m)  Wt 230 lb (104.327 kg)  BMI 38.27 kg/m2  SpO2 100%  Physical Exam  Nursing note and vitals reviewed. Constitutional: She is oriented to person, place, and time. She appears well-developed and well-nourished.  Non-toxic appearance.  HENT:  Head: Normocephalic.  Right Ear: Tympanic membrane and external ear normal.  Left Ear: Tympanic membrane and external ear normal.  Eyes: EOM and lids are normal. Pupils are equal, round, and reactive to  light.  Neck: Normal range of motion. Neck supple. Carotid bruit is not present.  Cardiovascular: Normal rate, regular rhythm, normal heart sounds, intact distal pulses and normal pulses.   Pulmonary/Chest: Breath sounds normal. No respiratory distress.  Abdominal: Soft. Bowel sounds are normal. There is no tenderness. There is no guarding.  Musculoskeletal: Normal range of motion.       Patient has a laceration of the tip of the right middle finger. Bleeding controlled with applying pressure. Full range of motion is appreciated.  Lymphadenopathy:       Head (right side): No submandibular adenopathy present.       Head (left side): No  submandibular adenopathy present.    She has no cervical adenopathy.  Neurological: She is alert and oriented to person, place, and time. She has normal strength. No cranial nerve deficit or sensory deficit.  Skin: Skin is warm and dry.  Psychiatric: She has a normal mood and affect. Her speech is normal.    ED Course  Procedures: LACERATION REPAIR - the patient is identified with arm band. Permission for the procedure is given by the patient. Procedural time out taken before repair of laceration to the right middle finger. The wound was cleansed with safe cleanse. The wound was inspected. No foreign body appreciated. A Steri-Strip was applied to approximate the edges. The wound was then repaired with Dermabond. The wound measures 2.2 cm. Patient tolerated the procedure without any problem or complication.  Labs Reviewed - No data to display No results found.   No diagnosis found.    MDM  I have reviewed nursing notes, vital signs, and all appropriate lab and imaging results for this patient. Patient sustained a laceration with a knife to the right middle finger. The wound was repaired with Dermabond. Patient advised to return if any changes or signs of infection.       Kathie Dike, PA 12/10/11 2146  Kathie Dike, PA 01/02/12 [phone]

## 2011-12-13 NOTE — ED Provider Notes (Signed)
Medical screening examination/treatment/procedure(s) were performed by non-physician practitioner and as supervising physician I was immediately available for consultation/collaboration.   Shelda Jakes, MD 12/13/11 [phone]

## 2012-01-07 NOTE — ED Provider Notes (Signed)
Medical screening examination/treatment/procedure(s) were performed by non-physician practitioner and as supervising physician I was immediately available for consultation/collaboration.  Shelda Jakes, MD 01/07/12 [phone]

## 2012-04-14 ENCOUNTER — Ambulatory Visit (HOSPITAL_COMMUNITY)
Admission: RE | Admit: 2012-04-14 | Discharge: 2012-04-14 | Disposition: A | Discharge status: Home / Self Care | Payer: Medicaid Other | Source: Ambulatory Visit | Attending: Pulmonary Disease | Admitting: Pulmonary Disease

## 2012-04-14 ENCOUNTER — Other Ambulatory Visit (HOSPITAL_COMMUNITY): Payer: Self-pay | Admitting: Pulmonary Disease

## 2012-04-14 DIAGNOSIS — M25469 Effusion, unspecified knee: Secondary | ICD-10-CM | POA: Insufficient documentation

## 2012-04-14 DIAGNOSIS — R52 Pain, unspecified: Secondary | ICD-10-CM

## 2012-04-14 DIAGNOSIS — M19049 Primary osteoarthritis, unspecified hand: Secondary | ICD-10-CM | POA: Insufficient documentation

## 2012-04-14 DIAGNOSIS — M1710 Unilateral primary osteoarthritis, unspecified knee: Secondary | ICD-10-CM | POA: Insufficient documentation

## 2012-06-19 ENCOUNTER — Ambulatory Visit (INDEPENDENT_AMBULATORY_CARE_PROVIDER_SITE_OTHER): Payer: Medicaid Other | Admitting: Otolaryngology

## 2012-07-03 ENCOUNTER — Ambulatory Visit (INDEPENDENT_AMBULATORY_CARE_PROVIDER_SITE_OTHER): Payer: Medicaid Other | Admitting: Otolaryngology

## 2012-07-03 DIAGNOSIS — H903 Sensorineural hearing loss, bilateral: Secondary | ICD-10-CM

## 2012-07-03 DIAGNOSIS — H7010 Chronic mastoiditis, unspecified ear: Secondary | ICD-10-CM

## 2012-07-03 DIAGNOSIS — H95129 Granulation of postmastoidectomy cavity, unspecified ear: Secondary | ICD-10-CM

## 2012-08-15 ENCOUNTER — Encounter: Payer: Self-pay | Admitting: Gastroenterology

## 2012-08-21 ENCOUNTER — Other Ambulatory Visit (HOSPITAL_COMMUNITY): Payer: Self-pay

## 2012-08-21 DIAGNOSIS — G2581 Restless legs syndrome: Secondary | ICD-10-CM

## 2012-08-26 ENCOUNTER — Ambulatory Visit: Payer: Medicaid Other | Attending: Pulmonary Disease | Admitting: Sleep Medicine

## 2012-08-26 VITALS — Ht 65.0 in | Wt 242.0 lb

## 2012-08-26 DIAGNOSIS — G4733 Obstructive sleep apnea (adult) (pediatric): Secondary | ICD-10-CM | POA: Insufficient documentation

## 2012-08-26 DIAGNOSIS — G2581 Restless legs syndrome: Secondary | ICD-10-CM

## 2012-08-26 DIAGNOSIS — Z6841 Body Mass Index (BMI) 40.0 and over, adult: Secondary | ICD-10-CM | POA: Insufficient documentation

## 2012-08-29 NOTE — Procedures (Signed)
[facility] Brenna Friesenhahn A. Gerilyn Pilgrim, MD     [email or web address]        [name], Sandy Valencia                ACCOUNT NO.:  [id number]  MEDICAL RECORD NO.:  [id number]          PATIENT TYPE:  OUT  LOCATION:  SLEEP LAB                     FACILITY:  [facility]  PHYSICIAN:  Shahad Mazurek A. Gerilyn Pilgrim, M.D. DATE OF BIRTH:  Dec 29, 1960  DATE OF STUDY:  08/26/2012                           NOCTURNAL POLYSOMNOGRAM  REFERRING PHYSICIAN:  Ramon Dredge L. Juanetta Gosling, M.D.  INDICATION:  This is a 52 year old who presents with loud snoring, fatigue, and trouble sleeping.  This study is being done to evaluate for obstructive sleep apnea syndrome.   MEDICATIONS:  Singulair, omeprazole, lisinopril, naproxen, hydrocodone, insulin, multivitamin, and aspirin.  EPWORTH SLEEPINESS SCALE:  4.  BMI:  40.  ARCHITECTURAL SUMMARY:  The total recording time is 396 minutes, sleep efficiency is 87%, sleep latency is 22 minutes, REM latency is 62 minutes.  Stage N1 11%, N2 67%, N3 1%, and REM sleep 20%.  RESPIRATORY SUMMARY:  Baseline oxygen saturation is 98, lowest saturation 79 during REM sleep.  Diagnostic AHI is 14 with the events occurring almost exclusively during REM sleep.  The REM AHI is 45.  LIMB MOVEMENT SUMMARY:  PLM index 5.  ELECTROCARDIOGRAM:  Semi average heart rate is 63 with no significant dysrhythmias observed.  IMPRESSION:  Mild-to-moderate obstructive sleep apnea syndrome, worse during REM sleep, but I recommend the patient undergo a formal CPAP titration study.  Thanks for this referral.    Rexford Prevo A. Gerilyn Pilgrim, M.D.    KAD/MEDQ  D:  08/29/2012 09:29:06  T:  08/29/2012 09:38:33  Job:  [id number]

## 2012-09-01 ENCOUNTER — Ambulatory Visit: Payer: Medicaid Other | Admitting: Gastroenterology

## 2012-09-02 ENCOUNTER — Encounter: Payer: Self-pay | Admitting: Gastroenterology

## 2012-09-02 ENCOUNTER — Ambulatory Visit (INDEPENDENT_AMBULATORY_CARE_PROVIDER_SITE_OTHER): Payer: Medicaid Other | Admitting: Gastroenterology

## 2012-09-02 VITALS — BP 145/85 | HR 85 | Temp 97.3°F | Ht 65.0 in | Wt 243.4 lb

## 2012-09-02 DIAGNOSIS — Z800 Family history of malignant neoplasm of digestive organs: Secondary | ICD-10-CM

## 2012-09-02 MED ORDER — PEG 3350-KCL-NA BICARB-NACL 420 G PO SOLR: 4000.0000 mL | ORAL | Status: DC

## 2012-09-02 NOTE — Progress Notes (Signed)
 Referring Provider: Hawkins, Edward L, MD Primary Care Physician:  HAWKINS,EDWARD L, MD Primary Gastroenterologist:  Dr. Rourk   Chief Complaint  Patient presents with  . Colonoscopy    HPI:   Sandy Valencia is a 52-year-old female presenting today for updated colonoscopy. Believes she had a TCS/EGD at [facility] in remote past. We are unable to retrieve records. Reports treatment in remote past for what sounds like H.pylori.   Denies any abdominal pain, constipation, diarrhea. No rectal bleeding. Omeprazole once daily controls GERD symptoms. No dysphagia. No complaints today. Her mother was diagnosed with colon cancer in her late 60s.   Past Medical History  Diagnosis Date  . Diabetes mellitus   . GERD (gastroesophageal reflux disease)     Past Surgical History  Procedure Laterality Date  . Abdominal hysterectomy    . Cholecystectomy    . Middle ear surgery      Current Outpatient Prescriptions  Medication Sig Dispense Refill  . aspirin EC 81 MG tablet Take 81 mg by mouth daily.      . Beclomethasone Dipropionate (QVAR IN) Inhale 1-2 puffs into the lungs daily as needed.      . HYDROcodone-acetaminophen (VICODIN) 5-500 MG per tablet Take 1 tablet by mouth 4 (four) times daily as needed. For pain      . insulin glargine (LANTUS SOLOSTAR) 100 UNIT/ML injection Inject 60 Units into the skin daily.       . Multiple Vitamin (MULTIVITAMIN WITH MINERALS) TABS Take 1 tablet by mouth daily.      . NOVOLOG FLEXPEN 100 UNIT/ML injection Inject 25 Units into the skin. If sugar levels are elevated, use sliding scale in addition to 25 units.      . omeprazole (PRILOSEC) 20 MG capsule Take 1 capsule by mouth Daily.      . polyethylene glycol-electrolytes (TRILYTE) 420 G solution Take 4,000 mLs by mouth as directed.  4000 mL  0   No current facility-administered medications for this visit.    Allergies as of 09/02/2012  . (No Known Allergies)    Family History  Problem Relation Age of  Onset  . Colon cancer Mother     diagnosed age 68, underwent surgical resection, metastatic disease several years later    History   Social History  . Marital Status: Divorced    Spouse Name: N/A    Number of Children: N/A  . Years of Education: N/A   Occupational History  . Not on file.   Social History Main Topics  . Smoking status: Never Smoker   . Smokeless tobacco: Not on file  . Alcohol Use: No  . Drug Use: No  . Sexually Active:    Other Topics Concern  . Not on file   Social History Narrative  . No narrative on file    Review of Systems: Gen: Denies any fever, chills, loss of appetite, fatigue, weight loss. CV: Denies chest pain, heart palpitations, syncope, peripheral edema. Resp:+DOE GI: SEE HPI GU : Denies urinary burning, urinary frequency, urinary incontinence.  MS: +arthritis Derm: Denies rash, itching, dry skin Psych: Denies depression, anxiety, confusion or memory loss  Heme: Denies bruising, bleeding, and enlarged lymph nodes.  Physical Exam: BP 145/85  Pulse 85  Temp(Src) 97.3 F (36.3 C) (Oral)  Ht 5' 5" (1.651 m)  Wt 243 lb 6.4 oz (110.406 kg)  BMI 40.5 kg/m2 General:   Alert and oriented. Well-developed, well-nourished, pleasant and cooperative. Head:  Normocephalic and atraumatic. Eyes:  Conjunctiva   pink, sclera clear, no icterus.   Conjunctiva pink. Ears:  Normal auditory acuity. Nose:  No deformity, discharge,  or lesions. Mouth:  No deformity or lesions, mucosa pink and moist.  Neck:  Supple, without mass or thyromegaly. Lungs:  Clear to auscultation bilaterally, without wheezing, rales, or rhonchi.  Heart:  S1, S2 present without murmurs noted.  Abdomen:  +BS, soft, non-tender and non-distended. Without mass or HSM. No rebound or guarding. No hernias noted. Rectal:  Deferred  Msk:  Symmetrical without gross deformities. Normal posture. Extremities:  Without clubbing or edema. Neurologic:  Alert and  oriented x4;  grossly normal  neurologically. Skin:  Intact, warm and dry without significant lesions or rashes Cervical Nodes:  No significant cervical adenopathy. Psych:  Alert and cooperative. Normal mood and affect.   

## 2012-09-02 NOTE — Patient Instructions (Addendum)
We have scheduled you for a colonoscopy with Dr. Jena Gauss in the near future.  Take 1/2 dose of Lantus the evening prior. No insulin the morning of the procedure.

## 2012-09-03 DIAGNOSIS — Z800 Family history of malignant neoplasm of digestive organs: Secondary | ICD-10-CM | POA: Insufficient documentation

## 2012-09-03 HISTORY — DX: Family history of malignant neoplasm of digestive organs: Z80.0

## 2012-09-03 NOTE — Assessment & Plan Note (Signed)
52 year old female presenting for screening colonoscopy, asymptomatic from a lower and upper GI standpoint. She notes her mother was diagnosed with colon cancer in her late 1s. She also states she had a TCS/EGD in remote past, but we are unable to obtain any records from [facility] where she states this was completed. Regardless, she is due for a lower GI tract evaluation and understands the need for screening.   Proceed with TCS with Dr. Jena Gauss in near future: the risks, benefits, and alternatives have been discussed with the patient in detail. The patient states understanding and desires to proceed.

## 2012-09-03 NOTE — Progress Notes (Signed)
CC PCP 

## 2012-09-22 ENCOUNTER — Encounter (HOSPITAL_COMMUNITY): Payer: Self-pay | Admitting: *Deleted

## 2012-09-22 ENCOUNTER — Ambulatory Visit (HOSPITAL_COMMUNITY)
Admission: RE | Admit: 2012-09-22 | Discharge: 2012-09-22 | Disposition: A | Discharge status: Home / Self Care | Payer: Medicaid Other | Source: Ambulatory Visit | Attending: Internal Medicine | Admitting: Internal Medicine

## 2012-09-22 ENCOUNTER — Encounter (HOSPITAL_COMMUNITY)
Admission: RE | Disposition: A | Discharge status: Home / Self Care | Payer: Self-pay | Source: Ambulatory Visit | Attending: Internal Medicine

## 2012-09-22 DIAGNOSIS — D128 Benign neoplasm of rectum: Secondary | ICD-10-CM | POA: Insufficient documentation

## 2012-09-22 DIAGNOSIS — Z800 Family history of malignant neoplasm of digestive organs: Secondary | ICD-10-CM

## 2012-09-22 DIAGNOSIS — Z01812 Encounter for preprocedural laboratory examination: Secondary | ICD-10-CM | POA: Insufficient documentation

## 2012-09-22 DIAGNOSIS — Z1211 Encounter for screening for malignant neoplasm of colon: Secondary | ICD-10-CM

## 2012-09-22 DIAGNOSIS — E119 Type 2 diabetes mellitus without complications: Secondary | ICD-10-CM | POA: Insufficient documentation

## 2012-09-22 DIAGNOSIS — D126 Benign neoplasm of colon, unspecified: Secondary | ICD-10-CM | POA: Insufficient documentation

## 2012-09-22 DIAGNOSIS — D129 Benign neoplasm of anus and anal canal: Secondary | ICD-10-CM

## 2012-09-22 HISTORY — PX: COLONOSCOPY: SHX5424

## 2012-09-22 LAB — GLUCOSE, CAPILLARY: Glucose-Capillary: 145 mg/dL — ABNORMAL HIGH (ref 70–99)

## 2012-09-22 SURGERY — COLONOSCOPY
Anesthesia: Moderate Sedation

## 2012-09-22 MED ORDER — MEPERIDINE HCL 100 MG/ML IJ SOLN
INTRAMUSCULAR | Status: AC
  Filled 2012-09-22: qty 2

## 2012-09-22 MED ORDER — ONDANSETRON HCL 4 MG/2ML IJ SOLN
INTRAMUSCULAR | Status: DC | PRN
  Administered 2012-09-22: 4 mg via INTRAVENOUS

## 2012-09-22 MED ORDER — STERILE WATER FOR IRRIGATION IR SOLN
Status: DC | PRN
  Administered 2012-09-22: 10:00:00

## 2012-09-22 MED ORDER — SODIUM CHLORIDE 0.9 % IV SOLN
INTRAVENOUS | Status: DC
  Administered 2012-09-22: 09:00:00 via INTRAVENOUS

## 2012-09-22 MED ORDER — ONDANSETRON HCL 4 MG/2ML IJ SOLN
INTRAMUSCULAR | Status: AC
  Filled 2012-09-22: qty 2

## 2012-09-22 MED ORDER — MEPERIDINE HCL 100 MG/ML IJ SOLN
INTRAMUSCULAR | Status: DC | PRN
  Administered 2012-09-22 (×2): 50 mg via INTRAVENOUS
  Administered 2012-09-22: 25 mg via INTRAVENOUS

## 2012-09-22 MED ORDER — MIDAZOLAM HCL 5 MG/5ML IJ SOLN
INTRAMUSCULAR | Status: AC
  Filled 2012-09-22: qty 10

## 2012-09-22 MED ORDER — MIDAZOLAM HCL 5 MG/5ML IJ SOLN
INTRAMUSCULAR | Status: DC | PRN
  Administered 2012-09-22 (×2): 1 mg via INTRAVENOUS
  Administered 2012-09-22: 2 mg via INTRAVENOUS
  Administered 2012-09-22: 1 mg via INTRAVENOUS
  Administered 2012-09-22: 2 mg via INTRAVENOUS

## 2012-09-22 NOTE — Interval H&P Note (Signed)
  History and Physical Interval Note:  09/22/2012 9:47 AM  Sandy Valencia  has presented today for surgery, with the diagnosis of FAMILY HISTORY OF COLON CANCER  The various methods of treatment have been discussed with the patient and family. After consideration of risks, benefits and other options for treatment, the patient has consented to  Procedure(s) with comments: COLONOSCOPY (N/A) - 9;45 as a surgical intervention .  The patient's history has been reviewed, patient examined, no change in status, stable for surgery.  I have reviewed the patient's chart and labs.  Questions were answered to the patient's satisfaction.     Eula Listen  Colonoscopy per plan.The risks, benefits, limitations, alternatives and imponderables have been reviewed with the patient. Questions have been answered. All parties are agreeable.

## 2012-09-22 NOTE — Op Note (Signed)
[facility] [address] [place], [address]   COLONOSCOPY PROCEDURE REPORT  PATIENT: Sandy Valencia, Sandy Valencia  MR#:         [id number] BIRTHDATE: [birth year] , 52  yrs. old GENDER: Female ENDOSCOPIST: R.  Roetta Sessions, MD FACP [facility] REFERRED BY:  Kari Baars, M.D. PROCEDURE DATE:  09/22/2012 PROCEDURE:     Colonoscopy with biopsy and snare polypectomy  INDICATIONS: Colorectal cancer screening-mother with a history of colon cancer but at an advanced age  INFORMED CONSENT:  The risks, benefits, alternatives and imponderables including but not limited to bleeding, perforation as well as the possibility of a missed lesion have been reviewed.  The potential for biopsy, lesion removal, etc. have also been discussed.  Questions have been answered.  All parties agreeable. Please see the history and physical in the medical record for more information.  MEDICATIONS: Versed 7 mg IV and Demerol 125 mg IV in divided doses. Zofran 4 mg  DESCRIPTION OF PROCEDURE:  After a digital rectal exam was performed, the EC-[id number] ([id number])  colonoscope was advanced from the anus through the rectum and colon to the area of the cecum, ileocecal valve and appendiceal orifice.  The cecum was deeply intubated.  These structures were well-seen and photographed for the record.  From the level of the cecum and ileocecal valve, the scope was slowly and cautiously withdrawn.  The mucosal surfaces were carefully surveyed utilizing scope tip deflection to facilitate fold flattening as needed.  The scope was pulled down into the rectum where a thorough examination including retroflexion was performed.    FINDINGS:  Adequate preparation.  Small external hemorrhoidal tag. Normal rectum aside from (1) diminutive polyp in the rectosigmoid junction. The patient had (1) 6 mm polyp in the distal transverse colon; otherwise, the remainder of colonic mucosa appeared normal.  THERAPEUTIC / DIAGNOSTIC  MANEUVERS PERFORMED:  The above-mentioned polyps were cold biopsied and hot snare removed, respectively.  COMPLICATIONS: None  CECAL WITHDRAWAL TIME:  16 minutes  IMPRESSION:  Colonic polyps-removed as described above  RECOMMENDATIONS: Followup on pathology.   _______________________________ eSigned:  R. Roetta Sessions, MD FACP [facility] 09/22/2012 10:49 AM   CC:

## 2012-09-22 NOTE — H&P (View-Only) (Signed)
Referring Provider: Fredirick Maudlin, MD Primary Care Physician:  Fredirick Maudlin, MD Primary Gastroenterologist:  Dr. Jena Gauss   Chief Complaint  Patient presents with  . Colonoscopy    HPI:   Sandy Valencia is a 52 year old female presenting today for updated colonoscopy. Believes she had a TCS/EGD at [facility] in remote past. We are unable to retrieve records. Reports treatment in remote past for what sounds like H.pylori.   Denies any abdominal pain, constipation, diarrhea. No rectal bleeding. Omeprazole once daily controls GERD symptoms. No dysphagia. No complaints today. Her mother was diagnosed with colon cancer in her late 15s.   Past Medical History  Diagnosis Date  . Diabetes mellitus   . GERD (gastroesophageal reflux disease)     Past Surgical History  Procedure Laterality Date  . Abdominal hysterectomy    . Cholecystectomy    . Middle ear surgery      Current Outpatient Prescriptions  Medication Sig Dispense Refill  . aspirin EC 81 MG tablet Take 81 mg by mouth daily.      . Beclomethasone Dipropionate (QVAR IN) Inhale 1-2 puffs into the lungs daily as needed.      Marland Kitchen HYDROcodone-acetaminophen (VICODIN) 5-500 MG per tablet Take 1 tablet by mouth 4 (four) times daily as needed. For pain      . insulin glargine (LANTUS SOLOSTAR) 100 UNIT/ML injection Inject 60 Units into the skin daily.       . Multiple Vitamin (MULTIVITAMIN WITH MINERALS) TABS Take 1 tablet by mouth daily.      Marland Kitchen NOVOLOG FLEXPEN 100 UNIT/ML injection Inject 25 Units into the skin. If sugar levels are elevated, use sliding scale in addition to 25 units.      Marland Kitchen omeprazole (PRILOSEC) 20 MG capsule Take 1 capsule by mouth Daily.      . polyethylene glycol-electrolytes (TRILYTE) 420 G solution Take 4,000 mLs by mouth as directed.  4000 mL  0   No current facility-administered medications for this visit.    Allergies as of 09/02/2012  . (No Known Allergies)    Family History  Problem Relation Age of  Onset  . Colon cancer Mother     diagnosed age 48, underwent surgical resection, metastatic disease several years later    History   Social History  . Marital Status: Divorced    Spouse Name: N/A    Number of Children: N/A  . Years of Education: N/A   Occupational History  . Not on file.   Social History Main Topics  . Smoking status: Never Smoker   . Smokeless tobacco: Not on file  . Alcohol Use: No  . Drug Use: No  . Sexually Active:    Other Topics Concern  . Not on file   Social History Narrative  . No narrative on file    Review of Systems: Gen: Denies any fever, chills, loss of appetite, fatigue, weight loss. CV: Denies chest pain, heart palpitations, syncope, peripheral edema. Resp:+DOE GI: SEE HPI GU : Denies urinary burning, urinary frequency, urinary incontinence.  MS: +arthritis Derm: Denies rash, itching, dry skin Psych: Denies depression, anxiety, confusion or memory loss  Heme: Denies bruising, bleeding, and enlarged lymph nodes.  Physical Exam: BP 145/85  Pulse 85  Temp(Src) 97.3 F (36.3 C) (Oral)  Ht 5\' 5"  (1.651 m)  Wt 243 lb 6.4 oz (110.406 kg)  BMI 40.5 kg/m2 General:   Alert and oriented. Well-developed, well-nourished, pleasant and cooperative. Head:  Normocephalic and atraumatic. Eyes:  Conjunctiva  pink, sclera clear, no icterus.   Conjunctiva pink. Ears:  Normal auditory acuity. Nose:  No deformity, discharge,  or lesions. Mouth:  No deformity or lesions, mucosa pink and moist.  Neck:  Supple, without mass or thyromegaly. Lungs:  Clear to auscultation bilaterally, without wheezing, rales, or rhonchi.  Heart:  S1, S2 present without murmurs noted.  Abdomen:  +BS, soft, non-tender and non-distended. Without mass or HSM. No rebound or guarding. No hernias noted. Rectal:  Deferred  Msk:  Symmetrical without gross deformities. Normal posture. Extremities:  Without clubbing or edema. Neurologic:  Alert and  oriented x4;  grossly normal  neurologically. Skin:  Intact, warm and dry without significant lesions or rashes Cervical Nodes:  No significant cervical adenopathy. Psych:  Alert and cooperative. Normal mood and affect.

## 2012-09-23 ENCOUNTER — Encounter: Payer: Self-pay | Admitting: Internal Medicine

## 2012-09-24 ENCOUNTER — Encounter (HOSPITAL_COMMUNITY): Payer: Self-pay | Admitting: Internal Medicine

## 2012-10-20 ENCOUNTER — Other Ambulatory Visit (HOSPITAL_COMMUNITY): Payer: Self-pay | Admitting: Pulmonary Disease

## 2012-10-20 DIAGNOSIS — Z139 Encounter for screening, unspecified: Secondary | ICD-10-CM

## 2012-10-21 ENCOUNTER — Other Ambulatory Visit: Payer: Self-pay

## 2012-10-21 DIAGNOSIS — G4710 Hypersomnia, unspecified: Secondary | ICD-10-CM

## 2012-10-21 DIAGNOSIS — G4730 Sleep apnea, unspecified: Secondary | ICD-10-CM

## 2012-10-28 ENCOUNTER — Ambulatory Visit: Payer: Medicaid Other | Attending: Pulmonary Disease | Admitting: Sleep Medicine

## 2012-10-28 DIAGNOSIS — G4730 Sleep apnea, unspecified: Secondary | ICD-10-CM

## 2012-10-28 DIAGNOSIS — G4733 Obstructive sleep apnea (adult) (pediatric): Secondary | ICD-10-CM | POA: Insufficient documentation

## 2012-10-28 DIAGNOSIS — G4710 Hypersomnia, unspecified: Secondary | ICD-10-CM

## 2012-10-28 DIAGNOSIS — Z6841 Body Mass Index (BMI) 40.0 and over, adult: Secondary | ICD-10-CM | POA: Insufficient documentation

## 2012-11-03 NOTE — Procedures (Signed)
[facility] Syniyah Bourne A. Gerilyn Pilgrim, MD     [email or web address]        NAMEMAURIANNA, Sandy Valencia                ACCOUNT NO.:  [id number]  MEDICAL RECORD NO.:  [id number]          PATIENT TYPE:  OUT  LOCATION:  SLEEP LAB                     FACILITY:  [facility]  PHYSICIAN:  Shawnta Schlegel A. Gerilyn Pilgrim, M.D. DATE OF BIRTH:  1960/10/18  DATE OF STUDY:  10/28/2012                           NOCTURNAL POLYSOMNOGRAM  REFERRING PHYSICIAN:  Ramon Dredge L. Juanetta Gosling, M.D.  INDICATION:  This is a 52 year old female who presents for a CPAP titration recording.  She has obstructive sleep apnea syndrome documented on the previous study.   MEDICATIONS:  Omeprazole, Singulair, lisinopril, naproxen, hydrocodone, insulin, multivitamin, and aspirin.  EPWORTH SLEEPINESS SCALE:  4.  BMI:  40.  ARCHITECTURAL SUMMARY:  The total recording time is 380 minutes.  Sleep efficiency 83%.  Sleep latency 6 minutes.  REM latency 81 minutes. Stage N1 7%, N2 63%, N3 1%, and REM sleep 30%.  RESPIRATORY SUMMARY:  Baseline oxygen saturation 97, lowest saturation 93 during REM sleep.  The patient was placed on positive pressures between 5 and 13.  She was placed in an AutoSet after that but overall did well on regular CPAP pressures.  LIMB MOVEMENT SUMMARY:  PLM index 6.  ELECTROCARDIOGRAM SUMMARY:  Average heart rate 63 with no significant dysrhythmias observed.  IMPRESSION:  Obstructive sleep apnea syndrome, which responds well to CPAP of 13.  RECOMMENDATION:  CPAP of 13.    Krystopher Kuenzel A. Gerilyn Pilgrim, M.D.    KAD/MEDQ  D:  11/03/2012 09:13:45  T:  11/03/2012 09:37:14  Job:  [id number]

## 2012-11-05 LAB — BASIC METABOLIC PANEL
BUN: 12 (ref 4–21)
Creatinine: 0.7 (ref 0.5–1.1)
Glucose: 138

## 2012-11-27 ENCOUNTER — Ambulatory Visit (HOSPITAL_COMMUNITY)
Admission: RE | Admit: 2012-11-27 | Discharge: 2012-11-27 | Disposition: A | Discharge status: Home / Self Care | Payer: Medicaid Other | Source: Ambulatory Visit | Attending: Pulmonary Disease | Admitting: Pulmonary Disease

## 2012-11-27 DIAGNOSIS — Z139 Encounter for screening, unspecified: Secondary | ICD-10-CM

## 2012-11-27 DIAGNOSIS — Z1231 Encounter for screening mammogram for malignant neoplasm of breast: Secondary | ICD-10-CM | POA: Insufficient documentation

## 2012-12-25 ENCOUNTER — Ambulatory Visit (INDEPENDENT_AMBULATORY_CARE_PROVIDER_SITE_OTHER): Payer: Medicaid Other | Admitting: Otolaryngology

## 2012-12-25 DIAGNOSIS — H95129 Granulation of postmastoidectomy cavity, unspecified ear: Secondary | ICD-10-CM

## 2013-02-02 ENCOUNTER — Emergency Department (HOSPITAL_COMMUNITY)
Admission: EM | Admit: 2013-02-02 | Discharge: 2013-02-02 | Disposition: A | Discharge status: Home / Self Care | Payer: Medicaid Other | Attending: Emergency Medicine | Admitting: Emergency Medicine

## 2013-02-02 ENCOUNTER — Emergency Department (HOSPITAL_COMMUNITY): Payer: Medicaid Other

## 2013-02-02 ENCOUNTER — Encounter (HOSPITAL_COMMUNITY): Payer: Self-pay | Admitting: *Deleted

## 2013-02-02 DIAGNOSIS — Z794 Long term (current) use of insulin: Secondary | ICD-10-CM | POA: Insufficient documentation

## 2013-02-02 DIAGNOSIS — E119 Type 2 diabetes mellitus without complications: Secondary | ICD-10-CM | POA: Insufficient documentation

## 2013-02-02 DIAGNOSIS — Z79899 Other long term (current) drug therapy: Secondary | ICD-10-CM | POA: Insufficient documentation

## 2013-02-02 DIAGNOSIS — IMO0002 Reserved for concepts with insufficient information to code with codable children: Secondary | ICD-10-CM | POA: Insufficient documentation

## 2013-02-02 DIAGNOSIS — R091 Pleurisy: Secondary | ICD-10-CM

## 2013-02-02 DIAGNOSIS — R079 Chest pain, unspecified: Secondary | ICD-10-CM | POA: Insufficient documentation

## 2013-02-02 DIAGNOSIS — Z7982 Long term (current) use of aspirin: Secondary | ICD-10-CM | POA: Insufficient documentation

## 2013-02-02 DIAGNOSIS — K219 Gastro-esophageal reflux disease without esophagitis: Secondary | ICD-10-CM | POA: Insufficient documentation

## 2013-02-02 DIAGNOSIS — I10 Essential (primary) hypertension: Secondary | ICD-10-CM | POA: Insufficient documentation

## 2013-02-02 DIAGNOSIS — M129 Arthropathy, unspecified: Secondary | ICD-10-CM | POA: Insufficient documentation

## 2013-02-02 HISTORY — DX: Unspecified osteoarthritis, unspecified site: M19.90

## 2013-02-02 LAB — CBC WITH DIFFERENTIAL/PLATELET
Basophils Absolute: 0 10*3/uL (ref 0.0–0.1)
Lymphocytes Relative: 24 % (ref 12–46)
Lymphs Abs: 1.7 10*3/uL (ref 0.7–4.0)
Neutro Abs: 4.8 10*3/uL (ref 1.7–7.7)
Neutrophils Relative %: 68 % (ref 43–77)
Platelets: 237 10*3/uL (ref 150–400)
RBC: 4.71 MIL/uL (ref 3.87–5.11)
RDW: 14.2 % (ref 11.5–15.5)
WBC: 7.1 10*3/uL (ref 4.0–10.5)

## 2013-02-02 LAB — D-DIMER, QUANTITATIVE: D-Dimer, Quant: 0.28 ug/mL-FEU (ref 0.00–0.48)

## 2013-02-02 LAB — TROPONIN I: Troponin I: <0.3 ng/mL (ref <0.30)

## 2013-02-02 LAB — BASIC METABOLIC PANEL
CO2: 28 mEq/L (ref 19–32)
Glucose, Bld: 114 mg/dL — ABNORMAL HIGH (ref 70–99)
Potassium: 3.6 mEq/L (ref 3.5–5.1)
Sodium: 140 mEq/L (ref 135–145)

## 2013-02-02 NOTE — ED Provider Notes (Signed)
CSN: [id number]     Arrival date & time 02/02/13  1411 History  This chart was scribed for Joya Gaskins, MD by Bennett Scrape, ED Scribe. This patient was seen in room [facility]18/[facility]18 and the patient's care was started at 3:30 PM.   Chief Complaint  Patient presents with  . Chest Pain    Patient is a 52 y.o. female presenting with chest pain. The history is provided by the patient.  Chest Pain Pain location:  Substernal area Pain quality: sharp   Pain radiates to:  Does not radiate Pain radiates to the back: no   Duration:  2 days Timing:  Intermittent Chronicity:  New Context comment:  Cleaning Relieved by: belching. Worsened by:  Deep breathing Associated symptoms: no abdominal pain, no diaphoresis, no fatigue, no shortness of breath, no syncope and not vomiting   Risk factors: diabetes mellitus and hypertension   Risk factors: no coronary artery disease and no prior DVT/PE     HPI Comments: Sandy Valencia is a 52 y.o. female who presents to the Emergency Department complaining of mid central CP that started 2 days ago. She states that the pain started while cleaning "at full speed" and has been intermittent since. She describes that pain as a sharp pain with "tingling" at times. She reports that the pain is improved with belching and aggravated with deep breathing. She denies that the pain is associated with exertion or movement and admits that she also cleaned today with no problems. She denies having a h/o cardiac conditions or DVTs. She denies being on any birth control or hormone therapy medications. She denies fevers, emesis, abdominal pain, syncope, weakness and diaphoresis as associated symptoms.   Past Medical History  Diagnosis Date  . Diabetes mellitus   . GERD (gastroesophageal reflux disease)   . Arthritis   . Hypertension    Past Surgical History  Procedure Laterality Date  . Abdominal hysterectomy    . Cholecystectomy    . Middle ear surgery    . Colonoscopy  N/A 09/22/2012    Procedure: COLONOSCOPY;  Surgeon: Corbin Ade, MD;  Location: [facility];  Service: Endoscopy;  Laterality: N/A;  9;45   Family History  Problem Relation Age of Onset  . Colon cancer Mother     diagnosed age 44, underwent surgical resection, metastatic disease several years later   History  Substance Use Topics  . Smoking status: Never Smoker   . Smokeless tobacco: Not on file  . Alcohol Use: No   No OB history provided.  Review of Systems  Constitutional: Negative for diaphoresis and fatigue.  Respiratory: Negative for shortness of breath.   Cardiovascular: Positive for chest pain. Negative for syncope.  Gastrointestinal: Negative for vomiting and abdominal pain.  All other systems reviewed and are negative.    Allergies  Review of patient's allergies indicates no known allergies.  Home Medications   Current Outpatient Rx  Name  Route  Sig  Dispense  Refill  . aspirin EC 81 MG tablet   Oral   Take 81 mg by mouth daily.         . beclomethasone (QVAR) 80 MCG/ACT inhaler   Inhalation   Inhale 2 puffs into the lungs 2 (two) times daily.         Marland Kitchen HYDROcodone-acetaminophen (NORCO) 7.5-325 MG per tablet   Oral   Take 1 tablet by mouth every 4 (four) hours as needed for pain.         Marland Kitchen  insulin glargine (LANTUS SOLOSTAR) 100 UNIT/ML injection   Subcutaneous   Inject 60 Units into the skin at bedtime.          Marland Kitchen lisinopril (PRINIVIL,ZESTRIL) 10 MG tablet   Oral   Take 10 mg by mouth daily.         . Multiple Vitamin (MULTIVITAMIN WITH MINERALS) TABS   Oral   Take 1 tablet by mouth daily.         Marland Kitchen NOVOLOG FLEXPEN 100 UNIT/ML injection   Subcutaneous   Inject 25 Units into the skin. If sugar levels are elevated, use sliding scale in addition to 25 units.         Marland Kitchen omeprazole (PRILOSEC) 20 MG capsule   Oral   Take 1 capsule by mouth Daily.          Triage Vitals: BP 155/82  Pulse 86  Temp(Src) 98.3 F (36.8 C) (Oral)   Resp 20  Ht 5\' 5"  (1.651 m)  Wt 248 lb (112.492 kg)  BMI 41.27 kg/m2  SpO2 97%  Physical Exam  Nursing note and vitals reviewed.  CONSTITUTIONAL: Well developed/well nourished HEAD: Normocephalic/atraumatic EYES: EOMI/PERRL ENMT: Mucous membranes moist NECK: supple no meningeal signs SPINE:entire spine nontender CV: S1/S2 noted, no murmurs/rubs/gallops noted CHEST: no tenderness to palpation  LUNGS: Lungs are clear to auscultation bilaterally, no apparent distress ABDOMEN: soft, nontender, no rebound or guarding NEURO: Pt is awake/alert, moves all extremitiesx4 EXTREMITIES: pulses normal, full ROM, no LE edema noted SKIN: warm, color normal PSYCH: no abnormalities of mood noted  ED Course  Procedures   DIAGNOSTIC STUDIES: Oxygen Saturation is 97% on room air, normal by my interpretation.    COORDINATION OF CARE: 3:37 PM-Informed pt that her EKG and CXR were normal. Discussed treatment plan which includes d-dimer, CBC panel, BMP and troponin with pt at bedside and pt agreed to plan. Pt declined pain medication.   Pt well appearing No EKG changes Low suspicion for ACS/Dissection (pt reported most of pain was pleuritic) ddimer negative Stable for d/c.  She has PCP followup in two days.  I don't feel this is ACS and she does not require admission/monitoring We discussed strict return precautions  Labs Review Labs Reviewed  CBC WITH DIFFERENTIAL  BASIC METABOLIC PANEL  TROPONIN I   Imaging Review Dg Chest 2 View  02/02/2013   CLINICAL DATA:  Chest pain.  EXAM: CHEST  2 VIEW  COMPARISON:  06/12/2009  FINDINGS: Heart and mediastinal contours are within normal limits. No focal opacities or effusions. No acute bony abnormality.  IMPRESSION: No active cardiopulmonary disease.   Electronically Signed   By: Charlett Nose   On: 02/02/2013 15:00    MDM  No diagnosis found. Nursing notes including past medical history and social history reviewed and considered in  documentation Labs/vital reviewed and considered xrays reviewed and considered     Date: 02/02/2013  Rate: 76  Rhythm: normal sinus rhythm  QRS Axis: normal  Intervals: normal  ST/T Wave abnormalities: nonspecific T wave changes  Conduction Disutrbances:none  Narrative Interpretation:   Old EKG Reviewed: unchanged from 12/2008    I personally performed the services described in this documentation, which was scribed in my presence. The recorded information has been reviewed and is accurate.      Joya Gaskins, MD 02/02/13 [phone]

## 2013-02-02 NOTE — ED Notes (Signed)
Discharge instructions reviewed with pt, questions answered. Pt verbalized understanding.  

## 2013-02-02 NOTE — ED Notes (Signed)
Chest pain since Saturday, "sharp , tingling at times and at times like gas with relief when belches"

## 2013-03-25 LAB — TSH: TSH: 2.97 (ref <=5.90)

## 2013-07-02 ENCOUNTER — Ambulatory Visit (INDEPENDENT_AMBULATORY_CARE_PROVIDER_SITE_OTHER): Payer: Medicaid Other | Admitting: Otolaryngology

## 2013-07-02 DIAGNOSIS — H95129 Granulation of postmastoidectomy cavity, unspecified ear: Secondary | ICD-10-CM

## 2013-07-15 ENCOUNTER — Emergency Department (HOSPITAL_COMMUNITY)
Admission: EM | Admit: 2013-07-15 | Discharge: 2013-07-15 | Disposition: A | Discharge status: Home / Self Care | Payer: Medicaid Other | Attending: Emergency Medicine | Admitting: Emergency Medicine

## 2013-07-15 ENCOUNTER — Encounter (HOSPITAL_COMMUNITY): Payer: Self-pay | Admitting: Emergency Medicine

## 2013-07-15 DIAGNOSIS — Z7982 Long term (current) use of aspirin: Secondary | ICD-10-CM | POA: Insufficient documentation

## 2013-07-15 DIAGNOSIS — K529 Noninfective gastroenteritis and colitis, unspecified: Secondary | ICD-10-CM

## 2013-07-15 DIAGNOSIS — Z794 Long term (current) use of insulin: Secondary | ICD-10-CM | POA: Insufficient documentation

## 2013-07-15 DIAGNOSIS — E669 Obesity, unspecified: Secondary | ICD-10-CM | POA: Insufficient documentation

## 2013-07-15 DIAGNOSIS — K219 Gastro-esophageal reflux disease without esophagitis: Secondary | ICD-10-CM | POA: Insufficient documentation

## 2013-07-15 DIAGNOSIS — E119 Type 2 diabetes mellitus without complications: Secondary | ICD-10-CM | POA: Insufficient documentation

## 2013-07-15 DIAGNOSIS — Z79899 Other long term (current) drug therapy: Secondary | ICD-10-CM | POA: Insufficient documentation

## 2013-07-15 DIAGNOSIS — IMO0002 Reserved for concepts with insufficient information to code with codable children: Secondary | ICD-10-CM | POA: Insufficient documentation

## 2013-07-15 DIAGNOSIS — K5289 Other specified noninfective gastroenteritis and colitis: Secondary | ICD-10-CM | POA: Insufficient documentation

## 2013-07-15 DIAGNOSIS — I10 Essential (primary) hypertension: Secondary | ICD-10-CM | POA: Insufficient documentation

## 2013-07-15 DIAGNOSIS — M129 Arthropathy, unspecified: Secondary | ICD-10-CM | POA: Insufficient documentation

## 2013-07-15 LAB — CBC WITH DIFFERENTIAL/PLATELET
Basophils Absolute: 0 10*3/uL (ref 0.0–0.1)
Basophils Relative: 0 % (ref 0–1)
EOS ABS: 0.1 10*3/uL (ref 0.0–0.7)
EOS PCT: 1 % (ref 0–5)
HCT: 46.6 % — ABNORMAL HIGH (ref 36.0–46.0)
Hemoglobin: 15.1 g/dL — ABNORMAL HIGH (ref 12.0–15.0)
LYMPHS ABS: 0.4 10*3/uL — AB (ref 0.7–4.0)
Lymphocytes Relative: 4 % — ABNORMAL LOW (ref 12–46)
MCH: 28.2 pg (ref 26.0–34.0)
MCHC: 32.4 g/dL (ref 30.0–36.0)
MCV: 86.9 fL (ref 78.0–100.0)
Monocytes Absolute: 0.3 10*3/uL (ref 0.1–1.0)
Monocytes Relative: 4 % (ref 3–12)
Neutro Abs: 8.1 10*3/uL — ABNORMAL HIGH (ref 1.7–7.7)
Neutrophils Relative %: 91 % — ABNORMAL HIGH (ref 43–77)
PLATELETS: 255 10*3/uL (ref 150–400)
RBC: 5.36 MIL/uL — AB (ref 3.87–5.11)
RDW: 14.6 % (ref 11.5–15.5)
WBC: 8.9 10*3/uL (ref 4.0–10.5)

## 2013-07-15 LAB — COMPREHENSIVE METABOLIC PANEL
ALT: 47 U/L — AB (ref 0–35)
AST: 32 U/L (ref 0–37)
Albumin: 4.1 g/dL (ref 3.5–5.2)
Alkaline Phosphatase: 67 U/L (ref 39–117)
BUN: 22 mg/dL (ref 6–23)
CALCIUM: 9.4 mg/dL (ref 8.4–10.5)
CO2: 24 mEq/L (ref 19–32)
Chloride: 101 mEq/L (ref 96–112)
Creatinine, Ser: 0.66 mg/dL (ref 0.50–1.10)
GFR calc non Af Amer: >90 mL/min (ref >90)
GLUCOSE: 153 mg/dL — AB (ref 70–99)
Potassium: 4 mEq/L (ref 3.7–5.3)
SODIUM: 140 meq/L (ref 137–147)
TOTAL PROTEIN: 7.9 g/dL (ref 6.0–8.3)
Total Bilirubin: 0.5 mg/dL (ref 0.3–1.2)

## 2013-07-15 LAB — URINALYSIS, ROUTINE W REFLEX MICROSCOPIC
Bilirubin Urine: NEGATIVE
Glucose, UA: 500 mg/dL — AB
Hgb urine dipstick: NEGATIVE
LEUKOCYTES UA: NEGATIVE
NITRITE: NEGATIVE
PH: 5.5 (ref 5.0–8.0)
Protein, ur: NEGATIVE mg/dL
Urobilinogen, UA: 0.2 mg/dL (ref 0.0–1.0)

## 2013-07-15 MED ORDER — ONDANSETRON HCL 4 MG/2ML IJ SOLN
4.0000 mg | Freq: Once | INTRAMUSCULAR | Status: AC
  Administered 2013-07-15: 4 mg via INTRAVENOUS
  Filled 2013-07-15: qty 2

## 2013-07-15 MED ORDER — SODIUM CHLORIDE 0.9 % IV BOLUS (SEPSIS)
1000.0000 mL | Freq: Once | INTRAVENOUS | Status: AC
  Administered 2013-07-15: 1000 mL via INTRAVENOUS

## 2013-07-15 MED ORDER — PROMETHAZINE HCL 25 MG PO TABS: 25.0000 mg | ORAL_TABLET | Freq: Four times a day (QID) | ORAL | Status: DC | PRN

## 2013-07-15 MED ORDER — DIPHENOXYLATE-ATROPINE 2.5-0.025 MG PO TABS: 1.0000 | ORAL_TABLET | Freq: Four times a day (QID) | ORAL | Status: DC | PRN

## 2013-07-15 MED ORDER — MORPHINE SULFATE 4 MG/ML IJ SOLN
4.0000 mg | Freq: Once | INTRAMUSCULAR | Status: AC
  Administered 2013-07-15: 4 mg via INTRAVENOUS
  Filled 2013-07-15: qty 1

## 2013-07-15 NOTE — Discharge Instructions (Signed)
Clear liquids for next 12 hours. Prescriptions for nausea and diarrhea medicine. Rest Viral Gastroenteritis Viral gastroenteritis is also known as stomach flu. This condition affects the stomach and intestinal tract. It can cause sudden diarrhea and vomiting. The illness typically lasts 3 to 8 days. Most people develop an immune response that eventually gets rid of the virus. While this natural response develops, the virus can make you quite ill. CAUSES  Many different viruses can cause gastroenteritis, such as rotavirus or noroviruses. You can catch one of these viruses by consuming contaminated food or water. You may also catch a virus by sharing utensils or other personal items with an infected person or by touching a contaminated surface. SYMPTOMS  The most common symptoms are diarrhea and vomiting. These problems can cause a severe loss of body fluids (dehydration) and a body salt (electrolyte) imbalance. Other symptoms may include:  Fever.  Headache.  Fatigue.  Abdominal pain. DIAGNOSIS  Your caregiver can usually diagnose viral gastroenteritis based on your symptoms and a physical exam. A stool sample may also be taken to test for the presence of viruses or other infections. TREATMENT  This illness typically goes away on its own. Treatments are aimed at rehydration. The most serious cases of viral gastroenteritis involve vomiting so severely that you are not able to keep fluids down. In these cases, fluids must be given through an intravenous line (IV). HOME CARE INSTRUCTIONS   Drink enough fluids to keep your urine clear or pale yellow. Drink small amounts of fluids frequently and increase the amounts as tolerated.  Ask your caregiver for specific rehydration instructions.  Avoid:  Foods high in sugar.  Alcohol.  Carbonated drinks.  Tobacco.  Juice.  Caffeine drinks.  Extremely hot or cold fluids.  Fatty, greasy foods.  Too much intake of anything at one  time.  Dairy products until 24 to 48 hours after diarrhea stops.  You may consume probiotics. Probiotics are active cultures of beneficial bacteria. They may lessen the amount and number of diarrheal stools in adults. Probiotics can be found in yogurt with active cultures and in supplements.  Wash your hands well to avoid spreading the virus.  Only take over-the-counter or prescription medicines for pain, discomfort, or fever as directed by your caregiver. Do not give aspirin to children. Antidiarrheal medicines are not recommended.  Ask your caregiver if you should continue to take your regular prescribed and over-the-counter medicines.  Keep all follow-up appointments as directed by your caregiver. SEEK IMMEDIATE MEDICAL CARE IF:   You are unable to keep fluids down.  You do not urinate at least once every 6 to 8 hours.  You develop shortness of breath.  You notice blood in your stool or vomit. This may look like coffee grounds.  You have abdominal pain that increases or is concentrated in one small area (localized).  You have persistent vomiting or diarrhea.  You have a fever.  The patient is a child younger than 3 months, and he or she has a fever.  The patient is a child older than 3 months, and he or she has a fever and persistent symptoms.  The patient is a child older than 3 months, and he or she has a fever and symptoms suddenly get worse.  The patient is a baby, and he or she has no tears when crying. MAKE SURE YOU:   Understand these instructions.  Will watch your condition.  Will get help right away if you are not doing  well or get worse. Document Released: 05/21/2005 Document Revised: 08/13/2011 Document Reviewed: 03/07/2011 [facility] Patient Information 2014 [place].

## 2013-07-15 NOTE — ED Notes (Signed)
Pt c/o abd pain with n/v/d x 3 days.  

## 2013-07-15 NOTE — ED Provider Notes (Signed)
CSN: [id number]     Arrival date & time 07/15/13  1539 History   This chart was scribed for Nat Christen, MD by Era Bumpers, ED scribe. This patient was seen in room APA06/[facility]06 and the patient's care was started at 1539.  Chief Complaint  Patient presents with  . Abdominal Pain   The history is provided by the patient. No language interpreter was used.   HPI Comments: RAHAF CARBONELL is a 53 y.o. female who presents to the Emergency Department complaining of constant, gradually worsened nausea, emesis and diarrhea, onset 3 days ago. She reports diarrhea began 3 days ago and had a total of 14 episodes diarrhea the fist day. She states emesis episodes began today and has had a total of x5 episodes today. She reports associated abdominal pain and has emesis episodes after she drinks fluid or takes medicine. She reports multiple sick contacts at home whom have recently had the flu. She states her mouth feels dry. She is currently taking Keflex and has also tried a Z-pak, both w/no improvement.   PCP Dr. Luan Pulling  Past Medical History  Diagnosis Date  . Diabetes mellitus   . GERD (gastroesophageal reflux disease)   . Arthritis   . Hypertension    Past Surgical History  Procedure Laterality Date  . Abdominal hysterectomy    . Cholecystectomy    . Middle ear surgery    . Colonoscopy N/A 09/22/2012    Procedure: COLONOSCOPY;  Surgeon: Daneil Dolin, MD;  Location: [facility];  Service: Endoscopy;  Laterality: N/A;  9;45   Family History  Problem Relation Age of Onset  . Colon cancer Mother     diagnosed age 63, underwent surgical resection, metastatic disease several years later   History  Substance Use Topics  . Smoking status: Never Smoker   . Smokeless tobacco: Not on file  . Alcohol Use: No   OB History   Grav Para Term Preterm Abortions TAB SAB Ect Mult Living                 Review of Systems  Constitutional: Negative for fever and chills.  Respiratory: Negative for cough  and shortness of breath.   Cardiovascular: Negative for chest pain.  Gastrointestinal: Positive for nausea, vomiting, abdominal pain and diarrhea.  Musculoskeletal: Negative for back pain.  All other systems reviewed and are negative.    Allergies  Review of patient's allergies indicates no known allergies.  Home Medications   Current Outpatient Rx  Name  Route  Sig  Dispense  Refill  . aspirin EC 81 MG tablet   Oral   Take 81 mg by mouth daily.         . beclomethasone (QVAR) 80 MCG/ACT inhaler   Inhalation   Inhale 2 puffs into the lungs 2 (two) times daily.         . Canagliflozin (INVOKANA) 100 MG TABS   Oral   Take 100 mg by mouth daily.         Marland Kitchen HYDROcodone-acetaminophen (NORCO) 7.5-325 MG per tablet   Oral   Take 1 tablet by mouth every 4 (four) hours as needed for pain.         Marland Kitchen insulin glargine (LANTUS SOLOSTAR) 100 UNIT/ML injection   Subcutaneous   Inject 60 Units into the skin at bedtime.          . Multiple Vitamin (MULTIVITAMIN WITH MINERALS) TABS   Oral   Take 1 tablet by mouth  daily.         Marland Kitchen NOVOLOG FLEXPEN 100 UNIT/ML injection   Subcutaneous   Inject 15-17 Units into the skin 3 (three) times daily with meals. If sugar levels are elevated, use sliding scale         . omeprazole (PRILOSEC) 20 MG capsule   Oral   Take 1 capsule by mouth Daily.         . diphenoxylate-atropine (LOMOTIL) 2.5-0.025 MG per tablet   Oral   Take 1 tablet by mouth 4 (four) times daily as needed for diarrhea or loose stools.   20 tablet   0   . promethazine (PHENERGAN) 25 MG tablet   Oral   Take 1 tablet (25 mg total) by mouth every 6 (six) hours as needed.   15 tablet   0    Triage Vitals: BP 135/79  Pulse 109  Temp(Src) 98.1 F (36.7 C)  Resp 18  Ht 5\' 5"  (1.651 m)  Wt 240 lb (108.863 kg)  BMI 39.94 kg/m2  SpO2 97%  Physical Exam  Nursing note and vitals reviewed. Constitutional: She is oriented to person, place, and time. She  appears well-developed and well-nourished.  obese  HENT:  Head: Normocephalic and atraumatic.  Eyes: Conjunctivae and EOM are normal. Pupils are equal, round, and reactive to light.  Neck: Normal range of motion. Neck supple.  Cardiovascular: Normal rate, regular rhythm and normal heart sounds.   Pulmonary/Chest: Effort normal and breath sounds normal.  Abdominal: Soft. Bowel sounds are normal. There is no tenderness.  Musculoskeletal: Normal range of motion.  Neurological: She is alert and oriented to person, place, and time.  Skin: Skin is warm and dry.  Psychiatric: She has a normal mood and affect. Her behavior is normal.   ED Course  Procedures (including critical care time) DIAGNOSTIC STUDIES: Oxygen Saturation is 97% on room air, normal by my interpretation.    COORDINATION OF CARE: At 430 PM Discussed treatment plan with patient which includes blood work, UA. Patient agrees.   Labs Review Labs Reviewed  CBC WITH DIFFERENTIAL - Abnormal; Notable for the following:    RBC 5.36 (*)    Hemoglobin 15.1 (*)    HCT 46.6 (*)    Neutrophils Relative % 91 (*)    Neutro Abs 8.1 (*)    Lymphocytes Relative 4 (*)    Lymphs Abs 0.4 (*)    All other components within normal limits  COMPREHENSIVE METABOLIC PANEL - Abnormal; Notable for the following:    Glucose, Bld 153 (*)    ALT 47 (*)    All other components within normal limits  URINALYSIS, ROUTINE W REFLEX MICROSCOPIC - Abnormal; Notable for the following:    Specific Gravity, Urine >1.030 (*)    Glucose, UA 500 (*)    Ketones, ur TRACE (*)    All other components within normal limits   Imaging Review No results found.  EKG Interpretation   None      MDM   Final diagnoses:  Gastroenteritis   Patient feels much better after IV fluids, pain meds, antiemetics.   No acute abdomen. Discharge medications Lomotil and Phenergan 25 mg    I personally performed the services described in this documentation, which was  scribed in my presence. The recorded information has been reviewed and is accurate.       Nat Christen, MD 07/15/13 [phone]

## 2013-08-27 ENCOUNTER — Ambulatory Visit (INDEPENDENT_AMBULATORY_CARE_PROVIDER_SITE_OTHER): Payer: Medicaid Other | Admitting: Otolaryngology

## 2013-08-27 DIAGNOSIS — H95129 Granulation of postmastoidectomy cavity, unspecified ear: Secondary | ICD-10-CM

## 2013-10-20 ENCOUNTER — Other Ambulatory Visit (HOSPITAL_COMMUNITY): Payer: Self-pay | Admitting: Pulmonary Disease

## 2013-10-20 DIAGNOSIS — Z1231 Encounter for screening mammogram for malignant neoplasm of breast: Secondary | ICD-10-CM

## 2013-11-19 ENCOUNTER — Ambulatory Visit (INDEPENDENT_AMBULATORY_CARE_PROVIDER_SITE_OTHER): Payer: Medicaid Other | Admitting: Otolaryngology

## 2013-11-19 DIAGNOSIS — H7010 Chronic mastoiditis, unspecified ear: Secondary | ICD-10-CM

## 2013-11-30 ENCOUNTER — Ambulatory Visit (HOSPITAL_COMMUNITY)
Admission: RE | Admit: 2013-11-30 | Discharge: 2013-11-30 | Disposition: A | Discharge status: Home / Self Care | Payer: Medicaid Other | Source: Ambulatory Visit | Attending: Pulmonary Disease | Admitting: Pulmonary Disease

## 2013-11-30 DIAGNOSIS — Z1231 Encounter for screening mammogram for malignant neoplasm of breast: Secondary | ICD-10-CM

## 2014-03-11 ENCOUNTER — Ambulatory Visit (INDEPENDENT_AMBULATORY_CARE_PROVIDER_SITE_OTHER): Payer: Medicaid Other | Admitting: Otolaryngology

## 2014-03-11 DIAGNOSIS — H7011 Chronic mastoiditis, right ear: Secondary | ICD-10-CM

## 2014-06-03 ENCOUNTER — Encounter: Payer: Medicaid Other | Admitting: Nutrition

## 2014-06-17 ENCOUNTER — Ambulatory Visit (INDEPENDENT_AMBULATORY_CARE_PROVIDER_SITE_OTHER): Payer: Medicaid Other | Admitting: Otolaryngology

## 2014-06-17 DIAGNOSIS — H7011 Chronic mastoiditis, right ear: Secondary | ICD-10-CM

## 2014-07-01 ENCOUNTER — Encounter: Payer: Medicaid Other | Admitting: Nutrition

## 2014-08-16 ENCOUNTER — Encounter: Payer: Medicaid Other | Admitting: Nutrition

## 2014-08-26 ENCOUNTER — Encounter: Payer: Medicaid Other | Attending: "Endocrinology | Admitting: Nutrition

## 2014-08-26 DIAGNOSIS — E118 Type 2 diabetes mellitus with unspecified complications: Secondary | ICD-10-CM | POA: Diagnosis not present

## 2014-08-26 DIAGNOSIS — IMO0002 Reserved for concepts with insufficient information to code with codable children: Secondary | ICD-10-CM

## 2014-08-26 DIAGNOSIS — Z6841 Body Mass Index (BMI) 40.0 and over, adult: Secondary | ICD-10-CM | POA: Insufficient documentation

## 2014-08-26 DIAGNOSIS — Z794 Long term (current) use of insulin: Secondary | ICD-10-CM | POA: Insufficient documentation

## 2014-08-26 DIAGNOSIS — E1165 Type 2 diabetes mellitus with hyperglycemia: Secondary | ICD-10-CM

## 2014-08-26 DIAGNOSIS — Z713 Dietary counseling and surveillance: Secondary | ICD-10-CM | POA: Insufficient documentation

## 2014-08-26 NOTE — Progress Notes (Signed)
  Medical Nutrition Therapy:  Appt start time: 8891 end time:  1630.  Assessment:  Primary concerns today: Diabetes. Lives with her grandson. Has had Dm for almost 20 years. Taking 60 units of Lantus and 20 units before meals of Novolog. Not taking Metformin due to making her sick. Stopped Invokana because she was afraid of what the advertisements report..   Physical activity: Walk 2-3 miles a daily. Uses a exercise ball.  Most recent A1C was 8.2%.   Diet is highly processed hig fat, high sodium and she eat a lot of food on the run. Has lost 50+ pounds in the past and knows she can lose it again with getting her eating habits under better control Needs to work on planing meals better. Needs more lower carbs and fresh fruits and vegetables.   At increased risk for cardiovascular events with morbid obesity and uncontrolled DM.        Preferred Learning Style:   Auditory  Visual  Hands on   Learning Readiness:    Ready  Change in progress  MEDICATIONS: See list   DIETARY INTAKE:  24-hr recall:  B ( AM): 2 eggs, bacon, sauage or liver pudding, 1 slices of wheat bread,  DIet Gineralte or Diet Orange Snk ( AM): none L ( PM): Cookout- usually doesn't eat out: BBQ sandwich, slaw and french fries., DIet Dt soda Snk ( PM):  D ( PM): Chicken tenders-3, toss salad with [place], Patty [employer], Diet Soda Snk ( PM): none Beverages: Water, Diet Dt Soda.  Usual physical activity: walks 2-3 miles per day.  Estimated energy needs: 1500 calories 170 g carbohydrates 112 g protein 42 g fat  Progress Towards Goal(s):  In progress.   Nutritional Diagnosis:  NB-1.1 Food and nutrition-related knowledge deficit As related to Diabetes.  As evidenced by A1C 8.2%..    Intervention:  Nutrition counseling and diabetes educations on diet, meal planning, portion sizes, MY Plate, CHO Counting, weight loss tips and target ranges for BS and prevention of complications of DM. Benefits of  exercise for needed weight loss.  Goal:  1. Cut out diet sodas. 2. Avoid skipping meals. 3. Follow the Plate Method daily. 4. Increase fresh fruits and vegetables. 5. Aim for 2-3 carb choices per meal. 6. Cut out high fat processed foods. 7. Lose 1 lb per week.  8. Get A1C down to 7% in three months.   Teaching Method Utilized:  Visual Auditory Hands on  Handouts given during visit include: Living Well with Diabetes Carb Counting and Food Label handouts Meal Plan Card  Barriers to learning/adherence to lifestyle change: none  Demonstrated degree of understanding via:  Teach Back   Monitoring/Evaluation:  Dietary intake, exercise, meal planning, and body weight in 1 week(s).

## 2014-08-26 NOTE — Patient Instructions (Signed)
Goal:  1. Cut out diet sodas. 2. Avoid skipping meals. 3. Follow the Plate Method daily. 4. Increase fresh fruits and vegetables. 5. Aim for 2-3 carb choices per meal. 6. Cut out high fat processed foods. 7. Lose 1 lb per week.  8. Get A1C down to 7% in three months.

## 2014-09-21 ENCOUNTER — Encounter: Payer: Self-pay | Admitting: Internal Medicine

## 2014-09-30 ENCOUNTER — Ambulatory Visit (INDEPENDENT_AMBULATORY_CARE_PROVIDER_SITE_OTHER): Payer: Medicaid Other | Admitting: Otolaryngology

## 2014-09-30 DIAGNOSIS — H95121 Granulation of postmastoidectomy cavity, right ear: Secondary | ICD-10-CM

## 2014-10-11 ENCOUNTER — Ambulatory Visit (INDEPENDENT_AMBULATORY_CARE_PROVIDER_SITE_OTHER): Payer: Medicaid Other | Admitting: Nurse Practitioner

## 2014-10-11 ENCOUNTER — Other Ambulatory Visit: Payer: Self-pay

## 2014-10-11 ENCOUNTER — Encounter: Payer: Self-pay | Admitting: Nurse Practitioner

## 2014-10-11 VITALS — BP 155/87 | HR 63 | Temp 97.2°F | Ht 65.0 in | Wt 247.0 lb

## 2014-10-11 DIAGNOSIS — R1013 Epigastric pain: Secondary | ICD-10-CM

## 2014-10-11 DIAGNOSIS — R1310 Dysphagia, unspecified: Secondary | ICD-10-CM | POA: Diagnosis not present

## 2014-10-11 DIAGNOSIS — R1314 Dysphagia, pharyngoesophageal phase: Secondary | ICD-10-CM

## 2014-10-11 NOTE — Assessment & Plan Note (Signed)
Patient with a history of GERD on Prilosec 20 mg once a day. Is having occasional breakthrough symptoms of about 1-2 times a month. Is also having solid food and dense food dysphagia, no pill dysphagia. See dysphagia assessment and plan for more details. At this point we'll proceed with an endoscopy with possible dilation to further assess her symptoms related to GERD and dysphagia. Possible esophagitis, gastritis, worsening GERD symptoms. This could be an etiology for her dysphagia although cannot rule out stricture, web, or ring.  Proceed with EGD with Dr. Gala Romney in near future: the risks, benefits, and alternatives have been discussed with the patient in detail. The patient states understanding and desires to proceed.  Patient is not on any anticoagulants other than 81 mg daily aspirin which she can continue to take. Is on antidiabetic medications, we'll have her take half her normal dose and I before and then the morning of. Is not on any chronic pain anxiety medications. At this point she should be appropriate for conscious sedation.

## 2014-10-11 NOTE — Assessment & Plan Note (Signed)
Patient with solid food/dense food dysphagia, denies pill dysphagia or problems with liquids. Has coughing after eating, increased belching. Does have a history of GERD with occasional breakthrough symptoms about one to 2 times a month. At this point we'll schedule her for an EGD with possible dilation due to dysphagia. Etiologies include likely erosive esophagitis although cannot rule out stricture, web, or ring.  Proceed with EGD with Dr. Gala Romney in near future: the risks, benefits, and alternatives have been discussed with the patient in detail. The patient states understanding and desires to proceed.  Patient is not on any anticoagulants other than 81 mg daily aspirin which she can continue to take. Is on antidiabetic medications, we'll have her take half her normal dose and I before and then the morning of. Is not on any chronic pain anxiety medications. At this point she should be appropriate for conscious sedation.

## 2014-10-11 NOTE — Progress Notes (Signed)
Primary Care Physician:  Alonza Bogus, MD Primary Gastroenterologist:  Dr. Gala Romney  Chief Complaint  Patient presents with  . Gastrophageal Reflux  . Cough    after eating    HPI:   54 year old female referred from PCP for GERD symptoms. PCP note reviewed. Patient has a history of reflux and is having worsening symptoms despite daily omeprazole 20 mg which started 10/11/2013. Patient with a colonoscopy in 2014 which found 2 polyps, the rectal polyp is hyperplastic in the transverse colon polyp was tubular adenoma. Recommend repeat surveillance colonoscopy in 5 years (2019). No endoscopy procedure noted in the system.  Today she states she has a history of GERD. Is having symptoms of dysphagia with solid and/or dense foods. No pill dysphagia or troubles with liquids. Every time she eats she has to cough. Is also having symptoms of increased belching. Occasional breakthrough esophageal burning and epigastric pain about 1-2 times a month. Has a history of esophageal dilation about 20 years ago and current symptoms are similar to then. She states the exacerbation of her symptoms started a few months ago. Since then she has gone back onto a diet and is trying to lose weight. Denies chest pain, dyspnea, abdominal pain, N/V, fever, chills, unintentional weight loss. Has had an intentional weight loss of 15 pound weight loss in the past 3 months. Sees a dietician. Denies any other upper or lower GI symptoms.  Past Medical History  Diagnosis Date  . Diabetes mellitus   . GERD (gastroesophageal reflux disease)   . Arthritis   . Hypertension     Past Surgical History  Procedure Laterality Date  . Abdominal hysterectomy    . Cholecystectomy    . Middle ear surgery    . Colonoscopy N/A 09/22/2012    Procedure: COLONOSCOPY;  Surgeon: Daneil Dolin, MD;  Location: [facility];  Service: Endoscopy;  Laterality: N/A;  9;45    Current Outpatient Prescriptions  Medication Sig Dispense Refill    . aspirin EC 81 MG tablet Take 81 mg by mouth daily.    . beclomethasone (QVAR) 80 MCG/ACT inhaler Inhale 2 puffs into the lungs 2 (two) times daily.    . Cyanocobalamin (VITAMIN B 12 PO) Take by mouth.    Marland Kitchen HYDROcodone-acetaminophen (NORCO) 7.5-325 MG per tablet Take 1 tablet by mouth every 4 (four) hours as needed for pain.    Marland Kitchen insulin glargine (LANTUS SOLOSTAR) 100 UNIT/ML injection Inject 60 Units into the skin at bedtime.     . Multiple Vitamin (MULTIVITAMIN WITH MINERALS) TABS Take 1 tablet by mouth daily.    Marland Kitchen NOVOLOG FLEXPEN 100 UNIT/ML injection Inject 15-17 Units into the skin 3 (three) times daily with meals. If sugar levels are elevated, use sliding scale    . omeprazole (PRILOSEC) 20 MG capsule Take 1 capsule by mouth Daily.    . Canagliflozin (INVOKANA) 100 MG TABS Take 100 mg by mouth daily.    . diphenoxylate-atropine (LOMOTIL) 2.5-0.025 MG per tablet Take 1 tablet by mouth 4 (four) times daily as needed for diarrhea or loose stools. (Patient not taking: Reported on 08/26/2014) 20 tablet 0  . promethazine (PHENERGAN) 25 MG tablet Take 1 tablet (25 mg total) by mouth every 6 (six) hours as needed. (Patient not taking: Reported on 10/11/2014) 15 tablet 0   No current facility-administered medications for this visit.    Allergies as of 10/11/2014  . (No Known Allergies)    Family History  Problem Relation Age of Onset  .  Colon cancer Mother     diagnosed age 71, underwent surgical resection, metastatic disease several years later    History   Social History  . Marital Status: Divorced    Spouse Name: N/A  . Number of Children: N/A  . Years of Education: N/A   Occupational History  . Not on file.   Social History Main Topics  . Smoking status: Never Smoker   . Smokeless tobacco: Not on file  . Alcohol Use: No  . Drug Use: No  . Sexual Activity: Yes    Birth Control/ Protection: Surgical   Other Topics Concern  . Not on file   Social History Narrative     Review of Systems: General: Negative for anorexia, unintentional weight loss, fever, chills, fatigue, weakness. Eyes: Negative for vision changes.  ENT: Negative for hoarseness, nasal congestion. CV: Negative for chest pain, angina, palpitations, peripheral edema.  Respiratory: Negative for dyspnea at rest, sputum, wheezing.  GI: See history of present illness. MS: Negative for joint pain, low back pain.  Derm: Negative for rash or itching.  Neuro: Negative for weakness, memory loss, confusion.  Psych: Negative for anxiety, depression.  Endo: Negative for unusual weight change.  Heme: Negative for bruising or bleeding. Allergy: Negative for rash or hives.    Physical Exam: BP 155/87 mmHg  Pulse 63  Temp(Src) 97.2 F (36.2 C)  Ht 5\' 5"  (1.651 m)  Wt 247 lb (112.038 kg)  BMI 41.10 kg/m2 General:   Alert and oriented. Pleasant and cooperative. Well-nourished and well-developed.  Head:  Normocephalic and atraumatic. Eyes:  Without icterus, sclera clear and conjunctiva pink.  Ears:  Normal auditory acuity. Mouth:  No deformity or lesions, oral mucosa pink. No OP edema. Neck:  Supple, without mass or thyromegaly. Lungs:  Clear to auscultation bilaterally. No wheezes, rales, or rhonchi. No distress.  Heart:  S1, S2 present without murmurs appreciated.  Abdomen:  +BS, obese, soft, non-tender and non-distended. No HSM noted. No guarding or rebound. No masses appreciated.  Rectal:  Deferred  Msk:  Symmetrical without gross deformities. Normal posture. Extremities:  Without clubbing or edema. Neurologic:  Alert and  oriented x4;  grossly normal neurologically. Skin:  Intact without significant lesions or rashes. Psych:  Alert and cooperative. Normal mood and affect.     10/11/2014 10:04 AM

## 2014-10-11 NOTE — Patient Instructions (Signed)
1. We will schedule your procedure (endoscopy with possible dilation) for you. 2. Further recommendations to be based on results of your procedure.

## 2014-10-13 NOTE — Progress Notes (Signed)
CC'ED TO PCP 

## 2014-10-14 ENCOUNTER — Encounter: Payer: Medicaid Other | Attending: "Endocrinology | Admitting: Nutrition

## 2014-10-14 VITALS — Ht 65.0 in | Wt 245.0 lb

## 2014-10-14 DIAGNOSIS — Z794 Long term (current) use of insulin: Secondary | ICD-10-CM | POA: Diagnosis not present

## 2014-10-14 DIAGNOSIS — Z713 Dietary counseling and surveillance: Secondary | ICD-10-CM | POA: Diagnosis not present

## 2014-10-14 DIAGNOSIS — Z6841 Body Mass Index (BMI) 40.0 and over, adult: Secondary | ICD-10-CM | POA: Diagnosis not present

## 2014-10-14 DIAGNOSIS — E1165 Type 2 diabetes mellitus with hyperglycemia: Secondary | ICD-10-CM

## 2014-10-14 DIAGNOSIS — E118 Type 2 diabetes mellitus with unspecified complications: Secondary | ICD-10-CM | POA: Insufficient documentation

## 2014-10-14 DIAGNOSIS — IMO0002 Reserved for concepts with insufficient information to code with codable children: Secondary | ICD-10-CM

## 2014-10-14 NOTE — Progress Notes (Signed)
  Medical Nutrition Therapy:  Appt start time: 1600 end time:  1630.  Assessment:  Primary concerns today: Diabetes.  She lost 7 lbs since visit. Participates in [employer] Has had Dm for almost 20 years. Taking 60 units of Lantus and 15 units before meals of Novolog. Has cut out  A lot bacon or sausage. Taking Invokana now 100 mg.  Physical activity: Walk 2-3 miles a daily. Uses a exercise ball.  Most recent A1C was 8.2%. BS log in today reveals her FBS are much better low to mid 100's   Doing much better with diet and exercise. More compliant with medications now.   At increased risk for cardiovascular events with morbid obesity and uncontrolled DM.       Preferred Learning Style:   Auditory  Visual  Hands on   Learning Readiness:    Ready  Change in progress  MEDICATIONS: See list   DIETARY INTAKE:  24-hr recall:  B ( AM): 2 boiled egg or cheese omelt, Snk ( AM): none L ( PM):  2 cups of sugar free jello or  Grilled cheese or ham and cheese on carb free taco, Snk ( PM):  D ( PM):  Chicken wings and toss salad- [place] dressing. Beverages: Water,   Usual physical activity: walks 2-3 miles per day.  Estimated energy needs: 1500 calories 170 g carbohydrates 112 g protein 42 g fat  Progress Towards Goal(s):  In progress.   Nutritional Diagnosis:  NB-1.1 Food and nutrition-related knowledge deficit As related to Diabetes.  As evidenced by A1C 8.2%..    Intervention:  Nutrition counseling and diabetes educations on diet, meal planning, portion sizes, MY Plate, CHO Counting, weight loss tips and target ranges for BS and prevention of complications of DM. Benefits of exercise for needed weight loss.  Goal: Keep up the good work. 1.  Eat 2-3 servings or carbs per meal. 2. Continue 2-3 miles walking per day. 3. COnitnue to drink water daily. 4. INcrease fresh vegetables. 5. Lose 1-2 lbs per week or 4-5 lbs per month..  Get A1C down to 7% in three months.   Teaching Method  Utilized:  Visual Auditory Hands on  Handouts given during visit include: Living Well with Diabetes Carb Counting and Food Label handouts Meal Plan Card  Barriers to learning/adherence to lifestyle change: none  Demonstrated degree of understanding via:  Teach Back   Monitoring/Evaluation:  Dietary intake, exercise, meal planning, and body weight in 3  month.s).

## 2014-10-14 NOTE — Patient Instructions (Signed)
Goal: Keep up the good work. 1.  Eat 2-3 servings or carbs per meal. 2. Continue 2-3 miles walking per day. 3. COnitnue to drink water daily. 4. INcrease fresh vegetables. 5. Lose 1-2 lbs per week or 4-5 lbs per month..  Get A1C down to 7% in three months.

## 2014-10-21 ENCOUNTER — Encounter (HOSPITAL_COMMUNITY): Payer: Self-pay | Admitting: *Deleted

## 2014-10-21 ENCOUNTER — Ambulatory Visit (HOSPITAL_COMMUNITY)
Admission: RE | Admit: 2014-10-21 | Discharge: 2014-10-21 | Disposition: A | Discharge status: Home / Self Care | Payer: Medicaid Other | Source: Ambulatory Visit | Attending: Internal Medicine | Admitting: Internal Medicine

## 2014-10-21 ENCOUNTER — Encounter (HOSPITAL_COMMUNITY)
Admission: RE | Disposition: A | Discharge status: Home / Self Care | Payer: Self-pay | Source: Ambulatory Visit | Attending: Internal Medicine

## 2014-10-21 DIAGNOSIS — I10 Essential (primary) hypertension: Secondary | ICD-10-CM | POA: Insufficient documentation

## 2014-10-21 DIAGNOSIS — K219 Gastro-esophageal reflux disease without esophagitis: Secondary | ICD-10-CM | POA: Insufficient documentation

## 2014-10-21 DIAGNOSIS — Z79891 Long term (current) use of opiate analgesic: Secondary | ICD-10-CM | POA: Insufficient documentation

## 2014-10-21 DIAGNOSIS — Z9049 Acquired absence of other specified parts of digestive tract: Secondary | ICD-10-CM | POA: Insufficient documentation

## 2014-10-21 DIAGNOSIS — M1990 Unspecified osteoarthritis, unspecified site: Secondary | ICD-10-CM | POA: Diagnosis not present

## 2014-10-21 DIAGNOSIS — R1310 Dysphagia, unspecified: Secondary | ICD-10-CM | POA: Diagnosis present

## 2014-10-21 DIAGNOSIS — Z800 Family history of malignant neoplasm of digestive organs: Secondary | ICD-10-CM | POA: Insufficient documentation

## 2014-10-21 DIAGNOSIS — Z79899 Other long term (current) drug therapy: Secondary | ICD-10-CM | POA: Insufficient documentation

## 2014-10-21 DIAGNOSIS — R1314 Dysphagia, pharyngoesophageal phase: Secondary | ICD-10-CM | POA: Diagnosis not present

## 2014-10-21 DIAGNOSIS — Z7982 Long term (current) use of aspirin: Secondary | ICD-10-CM | POA: Diagnosis not present

## 2014-10-21 DIAGNOSIS — K449 Diaphragmatic hernia without obstruction or gangrene: Secondary | ICD-10-CM | POA: Insufficient documentation

## 2014-10-21 DIAGNOSIS — Z794 Long term (current) use of insulin: Secondary | ICD-10-CM | POA: Insufficient documentation

## 2014-10-21 DIAGNOSIS — R1013 Epigastric pain: Secondary | ICD-10-CM

## 2014-10-21 DIAGNOSIS — E119 Type 2 diabetes mellitus without complications: Secondary | ICD-10-CM | POA: Diagnosis not present

## 2014-10-21 DIAGNOSIS — Z7951 Long term (current) use of inhaled steroids: Secondary | ICD-10-CM | POA: Insufficient documentation

## 2014-10-21 HISTORY — PX: ESOPHAGOGASTRODUODENOSCOPY: SHX5428

## 2014-10-21 HISTORY — PX: MALONEY DILATION: SHX5535

## 2014-10-21 LAB — GLUCOSE, CAPILLARY: Glucose-Capillary: 111 mg/dL — ABNORMAL HIGH (ref 65–99)

## 2014-10-21 SURGERY — EGD (ESOPHAGOGASTRODUODENOSCOPY)
Anesthesia: Moderate Sedation

## 2014-10-21 MED ORDER — MIDAZOLAM HCL 5 MG/5ML IJ SOLN
INTRAMUSCULAR | Status: AC
  Filled 2014-10-21: qty 10

## 2014-10-21 MED ORDER — ONDANSETRON HCL 4 MG/2ML IJ SOLN
INTRAMUSCULAR | Status: AC
  Filled 2014-10-21: qty 2

## 2014-10-21 MED ORDER — LIDOCAINE VISCOUS 2 % MT SOLN
OROMUCOSAL | Status: AC
  Filled 2014-10-21: qty 15

## 2014-10-21 MED ORDER — STERILE WATER FOR IRRIGATION IR SOLN
Status: DC | PRN
  Administered 2014-10-21: 15:00:00

## 2014-10-21 MED ORDER — LIDOCAINE VISCOUS 2 % MT SOLN
OROMUCOSAL | Status: DC | PRN
  Administered 2014-10-21: 3 mL via OROMUCOSAL

## 2014-10-21 MED ORDER — SODIUM CHLORIDE 0.9 % IV SOLN
INTRAVENOUS | Status: DC
  Administered 2014-10-21: 1000 mL via INTRAVENOUS

## 2014-10-21 MED ORDER — ONDANSETRON HCL 4 MG/2ML IJ SOLN
INTRAMUSCULAR | Status: DC | PRN
  Administered 2014-10-21: 4 mg via INTRAVENOUS

## 2014-10-21 MED ORDER — MEPERIDINE HCL 100 MG/ML IJ SOLN
INTRAMUSCULAR | Status: AC
  Filled 2014-10-21: qty 2

## 2014-10-21 MED ORDER — MIDAZOLAM HCL 5 MG/5ML IJ SOLN
INTRAMUSCULAR | Status: DC | PRN
  Administered 2014-10-21 (×2): 2 mg via INTRAVENOUS

## 2014-10-21 MED ORDER — MEPERIDINE HCL 100 MG/ML IJ SOLN
INTRAMUSCULAR | Status: DC | PRN
  Administered 2014-10-21 (×2): 50 mg via INTRAVENOUS

## 2014-10-21 NOTE — H&P (View-Only) (Signed)
Primary Care Physician:  Alonza Bogus, MD Primary Gastroenterologist:  Dr. Gala Romney  Chief Complaint  Patient presents with  . Gastrophageal Reflux  . Cough    after eating    HPI:   54 year old female referred from PCP for GERD symptoms. PCP note reviewed. Patient has a history of reflux and is having worsening symptoms despite daily omeprazole 20 mg which started 10/11/2013. Patient with a colonoscopy in 2014 which found 2 polyps, the rectal polyp is hyperplastic in the transverse colon polyp was tubular adenoma. Recommend repeat surveillance colonoscopy in 5 years (2019). No endoscopy procedure noted in the system.  Today she states she has a history of GERD. Is having symptoms of dysphagia with solid and/or dense foods. No pill dysphagia or troubles with liquids. Every time she eats she has to cough. Is also having symptoms of increased belching. Occasional breakthrough esophageal burning and epigastric pain about 1-2 times a month. Has a history of esophageal dilation about 20 years ago and current symptoms are similar to then. She states the exacerbation of her symptoms started a few months ago. Since then she has gone back onto a diet and is trying to lose weight. Denies chest pain, dyspnea, abdominal pain, N/V, fever, chills, unintentional weight loss. Has had an intentional weight loss of 15 pound weight loss in the past 3 months. Sees a dietician. Denies any other upper or lower GI symptoms.  Past Medical History  Diagnosis Date  . Diabetes mellitus   . GERD (gastroesophageal reflux disease)   . Arthritis   . Hypertension     Past Surgical History  Procedure Laterality Date  . Abdominal hysterectomy    . Cholecystectomy    . Middle ear surgery    . Colonoscopy N/A 09/22/2012    Procedure: COLONOSCOPY;  Surgeon: Daneil Dolin, MD;  [facility];  Service: Endoscopy;  Laterality: N/A;  9;45    Current Outpatient Prescriptions  Medication Sig Dispense Refill    . aspirin EC 81 MG tablet Take 81 mg by mouth daily.    . beclomethasone (QVAR) 80 MCG/ACT inhaler Inhale 2 puffs into the lungs 2 (two) times daily.    . Cyanocobalamin (VITAMIN B 12 PO) Take by mouth.    Marland Kitchen HYDROcodone-acetaminophen (NORCO) 7.5-325 MG per tablet Take 1 tablet by mouth every 4 (four) hours as needed for pain.    Marland Kitchen insulin glargine (LANTUS SOLOSTAR) 100 UNIT/ML injection Inject 60 Units into the skin at bedtime.     . Multiple Vitamin (MULTIVITAMIN WITH MINERALS) TABS Take 1 tablet by mouth daily.    Marland Kitchen NOVOLOG FLEXPEN 100 UNIT/ML injection Inject 15-17 Units into the skin 3 (three) times daily with meals. If sugar levels are elevated, use sliding scale    . omeprazole (PRILOSEC) 20 MG capsule Take 1 capsule by mouth Daily.    . Canagliflozin (INVOKANA) 100 MG TABS Take 100 mg by mouth daily.    . diphenoxylate-atropine (LOMOTIL) 2.5-0.025 MG per tablet Take 1 tablet by mouth 4 (four) times daily as needed for diarrhea or loose stools. (Patient not taking: Reported on 08/26/2014) 20 tablet 0  . promethazine (PHENERGAN) 25 MG tablet Take 1 tablet (25 mg total) by mouth every 6 (six) hours as needed. (Patient not taking: Reported on 10/11/2014) 15 tablet 0   No current facility-administered medications for this visit.    Allergies as of 10/11/2014  . (No Known Allergies)    Family History  Problem Relation Age of Onset  .  Colon cancer Mother     diagnosed age 11, underwent surgical resection, metastatic disease several years later    History   Social History  . Marital Status: Divorced    Spouse Name: N/A  . Number of Children: N/A  . Years of Education: N/A   Occupational History  . Not on file.   Social History Main Topics  . Smoking status: Never Smoker   . Smokeless tobacco: Not on file  . Alcohol Use: No  . Drug Use: No  . Sexual Activity: Yes    Birth Control/ Protection: Surgical   Other Topics Concern  . Not on file   Social History Narrative     Review of Systems: General: Negative for anorexia, unintentional weight loss, fever, chills, fatigue, weakness. Eyes: Negative for vision changes.  ENT: Negative for hoarseness, nasal congestion. CV: Negative for chest pain, angina, palpitations, peripheral edema.  Respiratory: Negative for dyspnea at rest, sputum, wheezing.  GI: See history of present illness. MS: Negative for joint pain, low back pain.  Derm: Negative for rash or itching.  Neuro: Negative for weakness, memory loss, confusion.  Psych: Negative for anxiety, depression.  Endo: Negative for unusual weight change.  Heme: Negative for bruising or bleeding. Allergy: Negative for rash or hives.    Physical Exam: BP 155/87 mmHg  Pulse 63  Temp(Src) 97.2 F (36.2 C)  Ht 5\' 5"  (1.651 m)  Wt 247 lb (112.038 kg)  BMI 41.10 kg/m2 General:   Alert and oriented. Pleasant and cooperative. Well-nourished and well-developed.  Head:  Normocephalic and atraumatic. Eyes:  Without icterus, sclera clear and conjunctiva pink.  Ears:  Normal auditory acuity. Mouth:  No deformity or lesions, oral mucosa pink. No OP edema. Neck:  Supple, without mass or thyromegaly. Lungs:  Clear to auscultation bilaterally. No wheezes, rales, or rhonchi. No distress.  Heart:  S1, S2 present without murmurs appreciated.  Abdomen:  +BS, obese, soft, non-tender and non-distended. No HSM noted. No guarding or rebound. No masses appreciated.  Rectal:  Deferred  Msk:  Symmetrical without gross deformities. Normal posture. Extremities:  Without clubbing or edema. Neurologic:  Alert and  oriented x4;  grossly normal neurologically. Skin:  Intact without significant lesions or rashes. Psych:  Alert and cooperative. Normal mood and affect.     10/11/2014 10:04 AM

## 2014-10-21 NOTE — Discharge Instructions (Signed)
EGD Discharge instructions Please read the instructions outlined below and refer to this sheet in the next few weeks. These discharge instructions provide you with general information on caring for yourself after you leave the hospital. Your doctor may also give you specific instructions. While your treatment has been planned according to the most current medical practices available, unavoidable complications occasionally occur. If you have any problems or questions after discharge, please call your doctor. ACTIVITY  You may resume your regular activity but move at a slower pace for the next 24 hours.   Take frequent rest periods for the next 24 hours.   Walking will help expel (get rid of) the air and reduce the bloated feeling in your abdomen.   No driving for 24 hours (because of the anesthesia (medicine) used during the test).   You may shower.   Do not sign any important legal documents or operate any machinery for 24 hours (because of the anesthesia used during the test).  NUTRITION  Drink plenty of fluids.   You may resume your normal diet.   Begin with a light meal and progress to your normal diet.   Avoid alcoholic beverages for 24 hours or as instructed by your caregiver.  MEDICATIONS  You may resume your normal medications unless your caregiver tells you otherwise.  WHAT YOU CAN EXPECT TODAY  You may experience abdominal discomfort such as a feeling of fullness or gas pains.  FOLLOW-UP  Your doctor will discuss the results of your test with you.  SEEK IMMEDIATE MEDICAL ATTENTION IF ANY OF THE FOLLOWING OCCUR:  Excessive nausea (feeling sick to your stomach) and/or vomiting.   Severe abdominal pain and distention (swelling).   Trouble swallowing.   Temperature over 101 F (37.8 C).   Rectal bleeding or vomiting of blood.    Stop omeprazole; begin Protonix 40 mg daily for GERD  GERD information provided  Office visit with [place] in 3  months  Gastroesophageal Reflux Disease, Adult Gastroesophageal reflux disease (GERD) happens when acid from your stomach flows up into the esophagus. When acid comes in contact with the esophagus, the acid causes soreness (inflammation) in the esophagus. Over time, GERD may create small holes (ulcers) in the lining of the esophagus. CAUSES   Increased body weight. This puts pressure on the stomach, making acid rise from the stomach into the esophagus.  Smoking. This increases acid production in the stomach.  Drinking alcohol. This causes decreased pressure in the lower esophageal sphincter (valve or ring of muscle between the esophagus and stomach), allowing acid from the stomach into the esophagus.  Late evening meals and a full stomach. This increases pressure and acid production in the stomach.  A malformed lower esophageal sphincter. Sometimes, no cause is found. SYMPTOMS   Burning pain in the lower part of the mid-chest behind the breastbone and in the mid-stomach area. This may occur twice a week or more often.  Trouble swallowing.  Sore throat.  Dry cough.  Asthma-like symptoms including chest tightness, shortness of breath, or wheezing. DIAGNOSIS  Your caregiver may be able to diagnose GERD based on your symptoms. In some cases, X-rays and other tests may be done to check for complications or to check the condition of your stomach and esophagus. TREATMENT  Your caregiver may recommend over-the-counter or prescription medicines to help decrease acid production. Ask your caregiver before starting or adding any new medicines.  HOME CARE INSTRUCTIONS   Change the factors that you can control. Ask your  caregiver for guidance concerning weight loss, quitting smoking, and alcohol consumption.  Avoid foods and drinks that make your symptoms worse, such as:  Caffeine or alcoholic drinks.  Chocolate.  Peppermint or mint flavorings.  Garlic and onions.  Spicy  foods.  Citrus fruits, such as oranges, lemons, or limes.  Tomato-based foods such as sauce, chili, salsa, and pizza.  Fried and fatty foods.  Avoid lying down for the 3 hours prior to your bedtime or prior to taking a nap.  Eat small, frequent meals instead of large meals.  Wear loose-fitting clothing. Do not wear anything tight around your waist that causes pressure on your stomach.  Raise the head of your bed 6 to 8 inches with wood blocks to help you sleep. Extra pillows will not help.  Only take over-the-counter or prescription medicines for pain, discomfort, or fever as directed by your caregiver.  Do not take aspirin, ibuprofen, or other nonsteroidal anti-inflammatory drugs (NSAIDs). SEEK IMMEDIATE MEDICAL CARE IF:   You have pain in your arms, neck, jaw, teeth, or back.  Your pain increases or changes in intensity or duration.  You develop nausea, vomiting, or sweating (diaphoresis).  You develop shortness of breath, or you faint.  Your vomit is green, yellow, black, or looks like coffee grounds or blood.  Your stool is red, bloody, or black. These symptoms could be signs of other problems, such as heart disease, gastric bleeding, or esophageal bleeding. MAKE SURE YOU:   Understand these instructions.  Will watch your condition.  Will get help right away if you are not doing well or get worse. Document Released: 02/28/2005 Document Revised: 08/13/2011 Document Reviewed: 12/08/2010 [facility] Patient Information 2015 [place], [place]. This information is not intended to replace advice given to you by your health care provider. Make sure you discuss any questions you have with your health care provider.

## 2014-10-21 NOTE — Interval H&P Note (Signed)
History and Physical Interval Note:  10/21/2014 2:35 PM  [name]  has presented today for surgery, with the diagnosis of dysphagia, dyspepsia  The various methods of treatment have been discussed with the patient and family. After consideration of risks, benefits and other options for treatment, the patient has consented to  Procedure(s) with comments: ESOPHAGOGASTRODUODENOSCOPY (EGD) (N/A) - 1400 - office to pt to arrive at 1:00, maybe 1:15 before pt can get here [place] (N/A) as a surgical intervention .  The patient's history has been reviewed, patient examined, no change in status, stable for surgery.  I have reviewed the patient's chart and labs.  Questions were answered to the patient's satisfaction.     Sandy Valencia  No change. EGD with  esophageal dilation as appropriate/feasible per plan.  The risks, benefits, limitations, alternatives and imponderables have been reviewed with the patient. Questions have been answered. All parties are agreeable.

## 2014-10-21 NOTE — Op Note (Signed)
[facility] [address] [place], [address]   ENDOSCOPY PROCEDURE REPORT  PATIENT: Sandy Valencia, Sandy Valencia  MR#: [id number] BIRTHDATE: 18-Apr-1961 , 89  yrs. old GENDER: female ENDOSCOPIST: R.  Garfield Cornea, MD FACP FACG REFERRED BY:  Sinda Du, M.D. PROCEDURE DATE:  10/22/14 PROCEDURE:  EGD, diagnostic and Maloney dilation of esophagus INDICATIONS:  Breakthrough reflux symptoms; esophageal dysphagia. MEDICATIONS: Versed 4 mg IV and Demerol 100 mg in divided doses. Xylocaine gel orally.  Zofran 4 mg IV ASA CLASS:      Class II  CONSENT: The risks, benefits, limitations, alternatives and imponderables have been discussed.  The potential for biopsy, esophogeal dilation, etc. have also been reviewed.  Questions have been answered.  All parties agreeable.  Please see the history and physical in the medical record for more information.  DESCRIPTION OF PROCEDURE: After the risks benefits and alternatives of the procedure were thoroughly explained, informed consent was obtained.  The EG-[id number] ([id number]) endoscope was introduced through the mouth and advanced to the second portion of the duodenum , limited by Without limitations. The instrument was slowly withdrawn as the mucosa was fully examined.    Normal-appearing patent tubular esophagus.  Stomach empty. Normal-appearing gastric mucosa.  Small hiatal hernia.  Patent pylorus.  Normal-appearing first and second portion of the duodenum.  Retroflexed views revealed a hiatal hernia.   Scope was withdrawn and a 54 [place] Maloney dilator was passed to the insertion easily. A look back revealed no apparent complications related to this maneuver.  The scope was then withdrawn from the patient and the procedure completed.  COMPLICATIONS: There were no immediate complications.  ENDOSCOPIC IMPRESSION: Small hiatal hernia; otherwise normal EGD?"status post passage of a Maloney dilator.  RECOMMENDATIONS: Stop  omeprazole; begin Protonix 40 mg daily. Office visit with [place] in 3 months.  REPEAT EXAM:  eSigned:  R. Garfield Cornea, MD Rosalita Chessman [facility] Oct 22, 2014 3:00 PM    CC:  CPT CODES: ICD CODES:  The ICD and CPT codes recommended by this software are interpretations from the data that the clinical staff has captured with the software.  The verification of the translation of this report to the ICD and CPT codes and modifiers is the sole responsibility of the health Sandy Valencia institution and practicing physician where this report was generated.  [place]. will not be held responsible for the validity of the ICD and CPT codes included on this report.  AMA assumes no liability for data contained or not contained herein. CPT is a Designer, television/film set of the [employer].

## 2014-10-25 ENCOUNTER — Other Ambulatory Visit (HOSPITAL_COMMUNITY): Payer: Self-pay | Admitting: Pulmonary Disease

## 2014-10-25 ENCOUNTER — Encounter (HOSPITAL_COMMUNITY): Payer: Self-pay | Admitting: Internal Medicine

## 2014-10-25 DIAGNOSIS — Z1231 Encounter for screening mammogram for malignant neoplasm of breast: Secondary | ICD-10-CM

## 2014-10-31 ENCOUNTER — Emergency Department (HOSPITAL_COMMUNITY)
Admission: EM | Admit: 2014-10-31 | Discharge: 2014-10-31 | Disposition: A | Discharge status: Home / Self Care | Payer: Medicaid Other | Attending: Emergency Medicine | Admitting: Emergency Medicine

## 2014-10-31 ENCOUNTER — Encounter (HOSPITAL_COMMUNITY): Payer: Self-pay

## 2014-10-31 DIAGNOSIS — S76311A Strain of muscle, fascia and tendon of the posterior muscle group at thigh level, right thigh, initial encounter: Secondary | ICD-10-CM | POA: Insufficient documentation

## 2014-10-31 DIAGNOSIS — Z7982 Long term (current) use of aspirin: Secondary | ICD-10-CM | POA: Insufficient documentation

## 2014-10-31 DIAGNOSIS — M1990 Unspecified osteoarthritis, unspecified site: Secondary | ICD-10-CM | POA: Insufficient documentation

## 2014-10-31 DIAGNOSIS — Y9289 Other specified places as the place of occurrence of the external cause: Secondary | ICD-10-CM | POA: Diagnosis not present

## 2014-10-31 DIAGNOSIS — X58XXXA Exposure to other specified factors, initial encounter: Secondary | ICD-10-CM | POA: Diagnosis not present

## 2014-10-31 DIAGNOSIS — Y9389 Activity, other specified: Secondary | ICD-10-CM | POA: Diagnosis not present

## 2014-10-31 DIAGNOSIS — S8991XA Unspecified injury of right lower leg, initial encounter: Secondary | ICD-10-CM | POA: Diagnosis present

## 2014-10-31 DIAGNOSIS — Z794 Long term (current) use of insulin: Secondary | ICD-10-CM | POA: Insufficient documentation

## 2014-10-31 DIAGNOSIS — Z7951 Long term (current) use of inhaled steroids: Secondary | ICD-10-CM | POA: Insufficient documentation

## 2014-10-31 DIAGNOSIS — Z79899 Other long term (current) drug therapy: Secondary | ICD-10-CM | POA: Insufficient documentation

## 2014-10-31 DIAGNOSIS — K219 Gastro-esophageal reflux disease without esophagitis: Secondary | ICD-10-CM | POA: Diagnosis not present

## 2014-10-31 DIAGNOSIS — E119 Type 2 diabetes mellitus without complications: Secondary | ICD-10-CM | POA: Diagnosis not present

## 2014-10-31 DIAGNOSIS — Y998 Other external cause status: Secondary | ICD-10-CM | POA: Diagnosis not present

## 2014-10-31 DIAGNOSIS — I10 Essential (primary) hypertension: Secondary | ICD-10-CM | POA: Diagnosis not present

## 2014-10-31 MED ORDER — DICLOFENAC SODIUM 50 MG PO TBEC: 50.0000 mg | DELAYED_RELEASE_TABLET | Freq: Two times a day (BID) | ORAL | Status: DC

## 2014-10-31 NOTE — Discharge Instructions (Signed)
Follow the instructions and take the medication as directed. Follow up with Dr.Hawkins.

## 2014-10-31 NOTE — ED Notes (Signed)
[place] on [place] at the mall and it cut off and threw me down. I think I pulled a muscle in my right leg when it happened.

## 2014-10-31 NOTE — ED Provider Notes (Signed)
CSN: [id number]     Arrival date & time 10/31/14  1955 History   First MD Initiated Contact with Patient 10/31/14 2004     Chief Complaint  Patient presents with  . Leg Injury     (Consider location/radiation/quality/duration/timing/severity/associated sxs/prior Treatment) Patient is a 54 y.o. female presenting with leg pain. The history is provided by the patient.  Leg Pain Location:  Leg Time since incident:  3 days Injury: yes   Leg location:  R upper leg Pain details:    Quality:  Sharp   Radiates to:  Does not radiate   Onset quality:  Sudden   Timing:  Constant   Progression:  Unchanged Chronicity:  New Dislocation: no   Foreign body present:  No foreign bodies Prior injury to area:  No Relieved by:  None tried Ineffective treatments:  None tried  Sandy Valencia is a 54 y.o. female who presents to the ED with right leg pain. She states she was at the mall and on the escalator 3 days ago and it cut off and she started to fall but caught herself. When she grabbed the rail she twisted her right leg and felt a pulling sensation.  She thinks she may have pulled a muscle in her right upper leg. She has hydrocodone at home for pain but did not take it because she does not like to take any pain medication if she is not sure what she is treating. She did take one ibuprofen. She denies any other problems.   Past Medical History  Diagnosis Date  . Diabetes mellitus   . GERD (gastroesophageal reflux disease)   . Arthritis   . Hypertension    Past Surgical History  Procedure Laterality Date  . Abdominal hysterectomy    . Cholecystectomy    . Middle ear surgery    . Colonoscopy N/A 09/22/2012    Procedure: COLONOSCOPY;  Surgeon: Daneil Dolin, MD;  [facility];  Service: Endoscopy;  Laterality: N/A;  9;45  . Esophagogastroduodenoscopy N/A 10/21/2014    Procedure: ESOPHAGOGASTRODUODENOSCOPY (EGD);  Surgeon: Daneil Dolin, MD;  [facility];  Service:  Endoscopy;  Laterality: N/A;  1400 - office to pt to arrive at 1:00, maybe 1:15 before pt can get here  . Maloney dilation N/A 10/21/2014    Procedure: [name] DILATION;  Surgeon: Daneil Dolin, MD;  [facility]: [facility];  Service: Endoscopy;  Laterality: N/A;   Family History  Problem Relation Age of Onset  . Colon cancer Mother     diagnosed age 4, underwent surgical resection, metastatic disease several years later  . Diabetes Mother   . Heart attack Father   . Diabetes Sister   . Hypothyroidism Brother   . Diabetes Sister   . Diabetes Sister   . Diabetes Sister   . Hypothyroidism Sister    History  Substance Use Topics  . Smoking status: Never Smoker   . Smokeless tobacco: Never Used  . Alcohol Use: No   OB History    No data available     Review of Systems Negative except as stated in HPI   Allergies  Review of patient's allergies indicates no known allergies.  Home Medications   Prior to Admission medications   Medication Sig Start Date End Date Taking? Authorizing Provider  aspirin EC 81 MG tablet Take 81 mg by mouth daily.    Historical Provider, MD  beclomethasone (QVAR) 80 MCG/ACT inhaler Inhale 2 puffs into the  lungs 2 (two) times daily.    Historical Provider, MD  calcium citrate-vitamin D (CITRACAL+D) 315-200 MG-UNIT per tablet Take 1 tablet by mouth 2 (two) times daily.    Historical Provider, MD  Canagliflozin (INVOKANA) 100 MG TABS Take 100 mg by mouth daily.    Historical Provider, MD  Cholecalciferol (VITAMIN D-3) 1000 UNITS CAPS Take 1 capsule by mouth daily.    Historical Provider, MD  diclofenac (VOLTAREN) 50 MG EC tablet Take 1 tablet (50 mg total) by mouth 2 (two) times daily. 10/31/14   Hope Bunnie Pion, NP  diphenoxylate-atropine (LOMOTIL) 2.5-0.025 MG per tablet Take 1 tablet by mouth 4 (four) times daily as needed for diarrhea or loose stools. Patient not taking: Reported on 10/20/2014 07/15/13   Nat Christen, MD  HYDROcodone-acetaminophen [facility])  7.5-325 MG per tablet Take 1 tablet by mouth every 4 (four) hours as needed for pain.    Historical Provider, MD  insulin glargine (LANTUS SOLOSTAR) 100 UNIT/ML injection Inject 60 Units into the skin at bedtime.     Historical Provider, MD  NOVOLOG FLEXPEN 100 UNIT/ML injection Inject 15-17 Units into the skin 3 (three) times daily with meals. If sugar levels are elevated, use sliding scale 11/05/11   Historical Provider, MD  omeprazole (PRILOSEC) 20 MG capsule Take 1 capsule by mouth Daily. 11/05/11   Historical Provider, MD  promethazine (PHENERGAN) 25 MG tablet Take 1 tablet (25 mg total) by mouth every 6 (six) hours as needed. Patient not taking: Reported on 10/20/2014 07/15/13   Nat Christen, MD  vitamin B-12 (CYANOCOBALAMIN) 100 MCG tablet Take 100 mcg by mouth daily.    Historical Provider, MD   BP 116/76 mmHg  Pulse 79  Temp(Src) 97.9 F (36.6 C) (Oral)  Resp 22  SpO2 97% Physical Exam  Constitutional: She is oriented to person, place, and time. She appears well-developed and well-nourished.  HENT:  Head: Normocephalic and atraumatic.  Eyes: EOM are normal.  Neck: Neck supple.  Cardiovascular: Normal rate.   Pulmonary/Chest: Effort normal.  Abdominal: Soft. There is no tenderness.  Musculoskeletal: Normal range of motion.       Right upper leg: She exhibits tenderness. She exhibits no swelling, no deformity and no laceration.       Legs: Tender on palpation right upper leg. Pedal pulses 2+ bilateral, adequate circulations and good strength and touch sensation.   Neurological: She is alert and oriented to person, place, and time. No cranial nerve deficit.  Skin: Skin is warm and dry.  Psychiatric: She has a normal mood and affect. Her behavior is normal.  Nursing note and vitals reviewed.   ED Course  Procedures  Ace wrap applied and patient states feels 100% better with compression. Ice pack and NSAIDS  MDM  54 y.o. female with pain to the posterior aspect of the right leg s/p  injury 3 days ago. Stable for d/c without neurovascular compromise and no focal neuro deficits of the right lower extremity. Discussed with the patient and all questioned fully answered. She will follow up with her PCP or return if any problems arise.  Final diagnoses:  Hamstring strain, right, initial encounter      [facility], NP 10/31/14 2041  Virgel Manifold, MD 11/02/14 [phone]

## 2014-12-02 ENCOUNTER — Ambulatory Visit (HOSPITAL_COMMUNITY)
Admission: RE | Admit: 2014-12-02 | Discharge: 2014-12-02 | Disposition: A | Discharge status: Home / Self Care | Payer: Medicaid Other | Source: Ambulatory Visit | Attending: Pulmonary Disease | Admitting: Pulmonary Disease

## 2014-12-02 DIAGNOSIS — Z1231 Encounter for screening mammogram for malignant neoplasm of breast: Secondary | ICD-10-CM | POA: Diagnosis not present

## 2014-12-03 ENCOUNTER — Ambulatory Visit (HOSPITAL_COMMUNITY): Payer: Medicaid Other | Admitting: Physical Therapy

## 2014-12-13 ENCOUNTER — Ambulatory Visit (HOSPITAL_COMMUNITY): Payer: Medicaid Other | Attending: Pulmonary Disease | Admitting: Physical Therapy

## 2014-12-13 DIAGNOSIS — M6281 Muscle weakness (generalized): Secondary | ICD-10-CM

## 2014-12-13 DIAGNOSIS — R262 Difficulty in walking, not elsewhere classified: Secondary | ICD-10-CM | POA: Diagnosis present

## 2014-12-13 DIAGNOSIS — M79604 Pain in right leg: Secondary | ICD-10-CM

## 2014-12-13 DIAGNOSIS — M6289 Other specified disorders of muscle: Secondary | ICD-10-CM | POA: Diagnosis present

## 2014-12-13 NOTE — Patient Instructions (Signed)
   BRIDGING  While lying on your back, tighten your lower abdominals, squeeze your buttocks and then raise your buttocks off the floor/bed as creating a "Bridge" with your body. Repeat 10 times, twice a day.      STRAIGHT LEG RAISE - SLR  While lying or sitting, raise up your leg with a straight knee.  Keep the opposite knee bent with the foot planted to the ground. Repeat 5-10 times each side, twice a day.      HIP ABDUCTION - SIDELYING  While lying on your side, slowly raise up your top leg to the side. Keep your knee straight and maintain your toes pointed forward the entire time.   The bottom leg can be bent to stabilize your body.   Repeat 5-10 times, twice a day.     PRONE HIP EXTENSION  While lying face down with your knee straight, slowly raise up leg off the ground. Do not lift the leg so high that you are using your back to do the motion. Repeat 5-10 times, twice a day.    Functional Quadriceps: Sit to Stand   Sit on edge of chair, feet flat on floor. Stand upright, extending knees fully. Repeat __5-10__ times per set. Do __1__ sets per session. Do __2__ sessions per day.  http://orth.exer.us/735   Copyright  VHI. All rights reserved.     STANDING HAMSTRING CURLS  While standing, bend your knee so that your heel moves towards your buttock. Repeat 5-10 times, twice a day.     PIRIFORMIS AND HIP STRETCH - SEATED  While sitting in a chair, cross your affected leg on top of the other as shown.   Next, gently lean forward until a stretch is felt along the crossed leg.   Repeat twice each leg, 2 times a day.     Hamstring stretch with foot on stool  While standing, place foot on stool or elevated surface.  Keep the foot of the leg that is being stretched in an upward facing direction and the hip in a neutral position.  Gently lean forward at the hip while keeping the spine in a neutral position.   Hold for 30 seconds twice each leg, twice a day.

## 2014-12-13 NOTE — Therapy (Signed)
[place], [place], [address] Phone: [phone]   Fax:  [phone]  Physical Therapy Evaluation  Patient Details  Name: Sandy Valencia MRN: [id number] Date of Birth: [birth year] Referring Provider:  Sinda Du, MD  Encounter Date: 12/13/2014      PT End of Session - 12/13/14 1637    Visit Number 1   Number of Visits 8   Date for PT Re-Evaluation 01/10/15   Authorization Type Medicaid and insurance from mall she was injured at (waiting for documentation of this insurance)   Authorization Time Period 12/13/14 to 02/13/15   PT Start Time 1558   PT Stop Time 1636   PT Time Calculation (min) 38 min   Activity Tolerance Patient tolerated treatment well   Behavior During Therapy [facility] for tasks assessed/performed      Past Medical History  Diagnosis Date  . Diabetes mellitus   . GERD (gastroesophageal reflux disease)   . Arthritis   . Hypertension     Past Surgical History  Procedure Laterality Date  . Abdominal hysterectomy    . Cholecystectomy    . Middle ear surgery    . Colonoscopy N/A 09/22/2012    Procedure: COLONOSCOPY;  Surgeon: Daneil Dolin, MD;  [facility];  Service: Endoscopy;  Laterality: N/A;  9;45  . Esophagogastroduodenoscopy N/A 10/21/2014    Procedure: ESOPHAGOGASTRODUODENOSCOPY (EGD);  Surgeon: Daneil Dolin, MD;  [facility];  Service: Endoscopy;  Laterality: N/A;  1400 - office to pt to arrive at 1:00, maybe 1:15 before pt can get here  . Maloney dilation N/A 10/21/2014    Procedure: [name] DILATION;  Surgeon: Daneil Dolin, MD;  [facility]: [facility];  Service: Endoscopy;  Laterality: N/A;    There were no vitals filed for this visit.  Visit Diagnosis:  Right leg pain - Plan: PT plan of care cert/re-cert  Proximal muscle weakness - Plan: PT plan of care cert/re-cert  Difficulty walking - Plan: PT plan of care cert/re-cert      Subjective  Assessment - 12/13/14 1601    Subjective Patient states that her hamstring is just tolerable usually, also has trouble if she's sitting for any length of time and then gets up. Also has trouble sleeping at night because of it.    Pertinent History Patient reports that she initially hurt her hamstring on May 26th; was on an escalator, which stopped and threw her off. Patient states that she went to the ER 3 days later.    How long can you sit comfortably? Unable, always uncomfortable in sitting    How long can you stand comfortably? Doesn't notice as much    How long can you walk comfortably? Doesn't notice as much    Patient Stated Goals get leg feeling better    Currently in Pain? Yes   Pain Score 4    Pain Location Leg   Pain Orientation Right   Pain Descriptors / Indicators Aching;Sore            [facility] PT Assessment - 12/13/14 0001    Assessment   Medical Diagnosis leg pain    Onset Date/Surgical Date 10/28/14   Next MD Visit August 22nd    Precautions   Precautions None   Restrictions   Weight Bearing Restrictions No   Balance Screen   Has the patient fallen in the past 6 months No   Has the patient had a decrease in  activity level because of a fear of falling?  Yes   Is the patient reluctant to leave their home because of a fear of falling?  Yes   Prior Function   Level of Independence Independent;Independent with basic ADLs;Independent with gait;Independent with transfers   Vocation Part time employment   Vocation Requirements CNA, mostly sits with people    Leisure shopping, swimming, bowling    Posture/Postural Control   Posture Comments increased lumbar lordosis, valgus knees, flexed at hips, forward head    AROM   Right Hip External Rotation  34  full range    Left Hip External Rotation  --  full range    Left Hip Internal Rotation  30   Right Knee Extension 1   Right Knee Flexion 129   Strength   Right Hip Flexion 5/5   Right Hip Extension 4-/5   Right Hip  ABduction 4/5   Left Hip Flexion 4/5   Left Hip Extension 4-/5   Left Hip ABduction 4+/5   Right Knee Flexion 4+/5   Right Knee Extension 4+/5   Left Knee Flexion 5/5   Left Knee Extension 5/5   Right Ankle Dorsiflexion 5/5   Left Ankle Dorsiflexion 5/5   Flexibility   Hamstrings very mild tightness    Piriformis tightness R over L    Ambulation/Gait   Gait Comments proximal muscle weakness, reduced rotation of trunk and pelvis                            PT Education - 12/13/14 1636    Education provided Yes   Education Details prognosis, plan of care moving forward, HEP, Medicaid restrictions    Person(s) Educated Patient   Methods Explanation;Handout   Comprehension Verbalized understanding;Returned demonstration;Need further instruction          PT Short Term Goals - 12/13/14 1641    PT SHORT TERM GOAL #1   Title Patient will be able to sit for at least 1 hour with pain no more than 2/10 R LE    Time 2   Period Weeks   Status New   PT SHORT TERM GOAL #2   Title Patient will demonstrate bilateral hip IR range of motion of 45 degrees    Time 2   Period Weeks   Status New   PT SHORT TERM GOAL #3   Title Patient will be independent in correctly and consistently performing appropriate HEP, to be updated PRN    Time 2   Period Weeks   Status New           PT Long Term Goals - 12/13/14 1642    PT LONG TERM GOAL #1   Title Patient will be able to sit for unlimited amounts of time with pain 0/10   Time 4   Period Weeks   Status New   PT LONG TERM GOAL #2   Title Patient will be able to perform static standing and functional gait tasks for unlimited amounts of time with pain 0/10   Time 4   Period Weeks   Status New   PT LONG TERM GOAL #3   Title Patient will demonstrate bilateral LE strength completely 5/5 and proximal muscle strength at leat 4+/5 bilaterally    Time 4   Period Weeks   Status New   PT LONG TERM GOAL #4   Title Patient  will be able to ambulate up/down hills and  stairs and over uneven surfaces with pain 0/10   Time 4   Period Weeks   Status New               Plan - 12/13/14 1637    Clinical Impression Statement Patient presents with R hamstring pain, proximal muscle weakness, postural and gait impairments, reduced functional activity tolerance, and reduced functional task performance. She states that she initially injured her hamstring when an escalator in [place] stopped and threw her in late May, and reports that the [employer] will be helping to cover her sessions. Patient states that over the past couple of months her leg has improved but pain is her main limiting factor right now. Patient will benefit from skilled PT services to address her impairments and assist her in reaching an optimal level of function.    Pt will benefit from skilled therapeutic intervention in order to improve on the following deficits Abnormal gait;Difficulty walking;Decreased endurance;Increased muscle spasms;Obesity;Decreased activity tolerance;Pain;Hypomobility;Impaired flexibility;Decreased mobility;Decreased strength;Postural dysfunction   Rehab Potential Good   PT Frequency 2x / week   PT Duration 4 weeks   PT Treatment/Interventions ADLs/Self Care Home Management;Cryotherapy;Moist Heat;Ultrasound;Gait training;Stair training;Functional mobility training;Therapeutic activities;Therapeutic exercise;Balance training;Neuromuscular re-education;Patient/family education;Manual techniques   PT Next Visit Plan Check to make sure if patient has [employer] today (medicaid will not pay for anything besides eval); functinoal stretching and strengthening; [place] and/or moist heat    PT Home Exercise Plan given    Consulted and Agree with Plan of Care Patient         Problem List Patient Active Problem List   Diagnosis Date Noted  . Hiatal hernia   . Dysphagia, pharyngoesophageal phase   . Dysphagia  10/11/2014  . Dyspepsia 10/11/2014  . Family history of colon cancer 09/03/2012    Deniece Ree PT, DPT [facility] [address] [place], [place], [address] Phone: [phone]   Fax:  [phone]

## 2014-12-13 NOTE — Therapy (Deleted)
[place], [place], [address] Phone: [phone]   Fax:  [phone]  Physical Therapy Treatment  Patient Details  Name: Sandy Valencia MRN: [id number] Date of Birth: 1961-01-02 Referring Provider:  Sinda Du, MD  Encounter Date: 12/13/2014      PT End of Session - 12/13/14 1637    Visit Number 1   Number of Visits 8   Date for PT Re-Evaluation 01/10/15   Authorization Type Medicaid and insurance from mall she was [place] at (waiting for documentation of this insurance)   Authorization Time Period 12/13/14 to 02/13/15   PT Start Time 1558   PT Stop Time 1636   PT Time Calculation (min) 38 min   Activity Tolerance Patient tolerated treatment well   Behavior During Therapy [facility] for tasks assessed/performed      Past Medical History  Diagnosis Date  . Diabetes mellitus   . GERD (gastroesophageal reflux disease)   . Arthritis   . Hypertension     Past Surgical History  Procedure Laterality Date  . Abdominal hysterectomy    . Cholecystectomy    . Middle ear surgery    . Colonoscopy N/A 09/22/2012    Procedure: COLONOSCOPY;  Surgeon: Daneil Dolin, MD;  [facility];  Service: Endoscopy;  Laterality: N/A;  9;45  . Esophagogastroduodenoscopy N/A 10/21/2014    Procedure: ESOPHAGOGASTRODUODENOSCOPY (EGD);  Surgeon: Daneil Dolin, MD;  [facility];  Service: Endoscopy;  Laterality: N/A;  1400 - office to pt to arrive at 1:00, maybe 1:15 before pt can get here  . Maloney dilation N/A 10/21/2014    Procedure: [name] DILATION;  Surgeon: Daneil Dolin, MD;  [facility];  Service: Endoscopy;  Laterality: N/A;    There were no vitals filed for this visit.  Visit Diagnosis:  Right leg pain - Plan: PT plan of care cert/re-cert  Proximal muscle weakness - Plan: PT plan of care cert/re-cert  Difficulty walking - Plan: PT plan of care cert/re-cert      Subjective Assessment  - 12/13/14 1601    Subjective Patient states that her hamstring is just tolerable usually, also has trouble if she's sitting for any length of time and then gets up. Also has trouble sleeping at night because of it.    Pertinent History Patient reports that she initially hurt her hamstring on May 26th; was on an escalator, which stopped and threw her off. Patient states that she went to the ER 3 days later.    How long can you sit comfortably? Unable, always uncomfortable in sitting    How long can you stand comfortably? Doesn't notice as much    How long can you walk comfortably? Doesn't notice as much    Patient Stated Goals get leg feeling better    Currently in Pain? Yes   Pain Score 4    Pain Location Leg   Pain Orientation Right   Pain Descriptors / Indicators Aching;Sore            [facility] PT Assessment - 12/13/14 0001    Assessment   Medical Diagnosis leg pain    Onset Date/Surgical Date 10/28/14   Next MD Visit August 22nd    Precautions   Precautions None   Restrictions   Weight Bearing Restrictions No   Balance Screen   Has the patient fallen in the past 6 months No   Has the patient had a decrease in  activity level because of a fear of falling?  Yes   Is the patient reluctant to leave their home because of a fear of falling?  Yes   Prior Function   Level of Independence Independent;Independent with basic ADLs;Independent with gait;Independent with transfers   Vocation Part time employment   Vocation Requirements CNA, mostly sits with people    Leisure shopping, swimming, bowling    Posture/Postural Control   Posture Comments increased lumbar lordosis, valgus knees, flexed at hips, forward head    AROM   Right Hip External Rotation  34  full range    Left Hip External Rotation  --  full range    Left Hip Internal Rotation  30   Right Knee Extension 1   Right Knee Flexion 129   Strength   Right Hip Flexion 5/5   Right Hip Extension 4-/5   Right Hip ABduction  4/5   Left Hip Flexion 4/5   Left Hip Extension 4-/5   Left Hip ABduction 4+/5   Right Knee Flexion 4+/5   Right Knee Extension 4+/5   Left Knee Flexion 5/5   Left Knee Extension 5/5   Right Ankle Dorsiflexion 5/5   Left Ankle Dorsiflexion 5/5   Flexibility   Hamstrings very mild tightness    Piriformis tightness R over L    Ambulation/Gait   Gait Comments proximal muscle weakness, reduced rotation of trunk and pelvis                              PT Education - 12/13/14 1636    Education provided Yes   Education Details prognosis, plan of care moving forward, HEP, Medicaid restrictions    Person(s) Educated Patient   Methods Explanation;Handout   Comprehension Verbalized understanding;Returned demonstration;Need further instruction          PT Short Term Goals - 12/13/14 1641    PT SHORT TERM GOAL #1   Title Patient will be able to sit for at least 1 hour with pain no more than 2/10 R LE    Time 2   Period Weeks   Status New   PT SHORT TERM GOAL #2   Title Patient will demonstrate bilateral hip IR range of motion of 45 degrees    Time 2   Period Weeks   Status New   PT SHORT TERM GOAL #3   Title Patient will be independent in correctly and consistently performing appropriate HEP, to be updated PRN    Time 2   Period Weeks   Status New           PT Long Term Goals - 12/13/14 1642    PT LONG TERM GOAL #1   Title Patient will be able to sit for unlimited amounts of time with pain 0/10   Time 4   Period Weeks   Status New   PT LONG TERM GOAL #2   Title Patient will be able to perform static standing and functional gait tasks for unlimited amounts of time with pain 0/10   Time 4   Period Weeks   Status New   PT LONG TERM GOAL #3   Title Patient will demonstrate bilateral LE strength completely 5/5 and proximal muscle strength at leat 4+/5 bilaterally    Time 4   Period Weeks   Status New   PT LONG TERM GOAL #4   Title Patient will be  able to ambulate up/down  hills and stairs and over uneven surfaces with pain 0/10   Time 4   Period Weeks   Status New               Plan - 12/13/14 1637    Clinical Impression Statement Patient presents with R hamstring pain, proximal muscle weakness, postural and gait impairments, reduced functional activity tolerance, and reduced functional task performance. She states that she initially injured her hamstring when an escalator in [place] stopped and threw her in late May, and reports that the [employer] will be helping to cover her sessions. Patient states that over the past couple of months her leg has improved but pain is her main limiting factor right now. Patient will benefit from skilled PT services to address her impairments and assist her in reaching an optimal level of function.    Pt will benefit from skilled therapeutic intervention in order to improve on the following deficits Abnormal gait;Difficulty walking;Decreased endurance;Increased muscle spasms;Obesity;Decreased activity tolerance;Pain;Hypomobility;Impaired flexibility;Decreased mobility;Decreased strength;Postural dysfunction   Rehab Potential Good   PT Frequency 2x / week   PT Duration 4 weeks   PT Treatment/Interventions ADLs/Self Care Home Management;Cryotherapy;Moist Heat;Ultrasound;Gait training;Stair training;Functional mobility training;Therapeutic activities;Therapeutic exercise;Balance training;Neuromuscular re-education;Patient/family education;Manual techniques   PT Next Visit Plan Check to make sure if patient has [employer] today (medicaid will not pay for anything besides eval); functinoal stretching and strengthening; [place] and/or moist heat    PT Home Exercise Plan given    Consulted and Agree with Plan of Care Patient        Problem List Patient Active Problem List   Diagnosis Date Noted  . Hiatal hernia   . Dysphagia, pharyngoesophageal phase   . Dysphagia 10/11/2014  .  Dyspepsia 10/11/2014  . Family history of colon cancer 09/03/2012    Hunt Oris 12/13/2014, 4:52 PM  [facility] [address] [place], [place], [address] Phone: [phone]   Fax:  [phone]

## 2014-12-23 ENCOUNTER — Ambulatory Visit (INDEPENDENT_AMBULATORY_CARE_PROVIDER_SITE_OTHER): Payer: Medicaid Other | Admitting: Otolaryngology

## 2014-12-23 DIAGNOSIS — H95129 Granulation of postmastoidectomy cavity, unspecified ear: Secondary | ICD-10-CM

## 2014-12-26 ENCOUNTER — Emergency Department (HOSPITAL_COMMUNITY)
Admission: EM | Admit: 2014-12-26 | Discharge: 2014-12-27 | Disposition: A | Discharge status: Home / Self Care | Payer: Medicaid Other | Attending: Emergency Medicine | Admitting: Emergency Medicine

## 2014-12-26 ENCOUNTER — Encounter (HOSPITAL_COMMUNITY): Payer: Self-pay | Admitting: *Deleted

## 2014-12-26 DIAGNOSIS — Z79899 Other long term (current) drug therapy: Secondary | ICD-10-CM | POA: Insufficient documentation

## 2014-12-26 DIAGNOSIS — R55 Syncope and collapse: Secondary | ICD-10-CM | POA: Diagnosis present

## 2014-12-26 DIAGNOSIS — K219 Gastro-esophageal reflux disease without esophagitis: Secondary | ICD-10-CM | POA: Insufficient documentation

## 2014-12-26 DIAGNOSIS — Z7982 Long term (current) use of aspirin: Secondary | ICD-10-CM | POA: Diagnosis not present

## 2014-12-26 DIAGNOSIS — M1990 Unspecified osteoarthritis, unspecified site: Secondary | ICD-10-CM | POA: Insufficient documentation

## 2014-12-26 DIAGNOSIS — I10 Essential (primary) hypertension: Secondary | ICD-10-CM | POA: Insufficient documentation

## 2014-12-26 DIAGNOSIS — Z794 Long term (current) use of insulin: Secondary | ICD-10-CM | POA: Insufficient documentation

## 2014-12-26 DIAGNOSIS — Z7951 Long term (current) use of inhaled steroids: Secondary | ICD-10-CM | POA: Diagnosis not present

## 2014-12-26 DIAGNOSIS — M799 Soft tissue disorder, unspecified: Secondary | ICD-10-CM | POA: Insufficient documentation

## 2014-12-26 DIAGNOSIS — E119 Type 2 diabetes mellitus without complications: Secondary | ICD-10-CM | POA: Insufficient documentation

## 2014-12-26 DIAGNOSIS — E876 Hypokalemia: Secondary | ICD-10-CM | POA: Insufficient documentation

## 2014-12-26 DIAGNOSIS — E669 Obesity, unspecified: Secondary | ICD-10-CM | POA: Diagnosis not present

## 2014-12-26 LAB — CBC WITH DIFFERENTIAL/PLATELET
BASOS ABS: 0 10*3/uL (ref 0.0–0.1)
Basophils Relative: 0 % (ref 0–1)
EOS ABS: 0.1 10*3/uL (ref 0.0–0.7)
EOS PCT: 2 % (ref 0–5)
HEMATOCRIT: 45.8 % (ref 36.0–46.0)
Hemoglobin: 14.6 g/dL (ref 12.0–15.0)
LYMPHS ABS: 2.3 10*3/uL (ref 0.7–4.0)
Lymphocytes Relative: 32 % (ref 12–46)
MCH: 28.2 pg (ref 26.0–34.0)
MCHC: 31.9 g/dL (ref 30.0–36.0)
MCV: 88.6 fL (ref 78.0–100.0)
MONOS PCT: 6 % (ref 3–12)
Monocytes Absolute: 0.4 10*3/uL (ref 0.1–1.0)
NEUTROS PCT: 60 % (ref 43–77)
Neutro Abs: 4.4 10*3/uL (ref 1.7–7.7)
Platelets: 250 10*3/uL (ref 150–400)
RBC: 5.17 MIL/uL — AB (ref 3.87–5.11)
RDW: 14.7 % (ref 11.5–15.5)
WBC: 7.3 10*3/uL (ref 4.0–10.5)

## 2014-12-26 LAB — BASIC METABOLIC PANEL
ANION GAP: 11 (ref 5–15)
BUN: 21 mg/dL — ABNORMAL HIGH (ref 6–20)
CHLORIDE: 103 mmol/L (ref 101–111)
CO2: 25 mmol/L (ref 22–32)
CREATININE: 0.76 mg/dL (ref 0.44–1.00)
Calcium: 9.2 mg/dL (ref 8.9–10.3)
GFR calc Af Amer: >60 mL/min (ref >60)
GFR calc non Af Amer: >60 mL/min (ref >60)
Glucose, Bld: 126 mg/dL — ABNORMAL HIGH (ref 65–99)
Potassium: 3.3 mmol/L — ABNORMAL LOW (ref 3.5–5.1)
Sodium: 139 mmol/L (ref 135–145)

## 2014-12-26 LAB — URINALYSIS, ROUTINE W REFLEX MICROSCOPIC
Bilirubin Urine: NEGATIVE
Hgb urine dipstick: NEGATIVE
KETONES UR: 15 mg/dL — AB
Leukocytes, UA: NEGATIVE
Nitrite: NEGATIVE
PROTEIN: NEGATIVE mg/dL
Specific Gravity, Urine: >1.03 — ABNORMAL HIGH (ref 1.005–1.030)
UROBILINOGEN UA: 0.2 mg/dL (ref 0.0–1.0)
pH: 5.5 (ref 5.0–8.0)

## 2014-12-26 LAB — URINE MICROSCOPIC-ADD ON

## 2014-12-26 LAB — I-STAT TROPONIN, ED: Troponin i, poc: 0 ng/mL (ref 0.00–0.08)

## 2014-12-26 LAB — D-DIMER, QUANTITATIVE: D-Dimer, Quant: <0.27 ug/mL-FEU (ref 0.00–0.48)

## 2014-12-26 MED ORDER — SODIUM CHLORIDE 0.9 % IV BOLUS (SEPSIS)
1000.0000 mL | Freq: Once | INTRAVENOUS | Status: AC
  Administered 2014-12-26: 1000 mL via INTRAVENOUS

## 2014-12-26 MED ORDER — POTASSIUM CHLORIDE CRYS ER 20 MEQ PO TBCR
40.0000 meq | EXTENDED_RELEASE_TABLET | Freq: Once | ORAL | Status: AC
  Administered 2014-12-26: 40 meq via ORAL
  Filled 2014-12-26: qty 2

## 2014-12-26 NOTE — ED Notes (Signed)
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 Pt states she has been sweating all day at work/ Pt states several times she felt like she was going to pass out but didn't. Pt states when she sat down in a cool room she would feel better. Pt states she is having pain and weakness in her left arm, heart racing (heart rate 93 in triage), and nauseated. Pt moving both arms in triage with no problems. Equal grips.

## 2014-12-26 NOTE — ED Provider Notes (Signed)
CSN: [id number]     Arrival date & time 12/26/14  2003 History   First MD Initiated Contact with Patient 12/26/14 2017     Chief Complaint  Patient presents with  . Near Syncope     (Consider location/radiation/quality/duration/timing/severity/associated sxs/prior Treatment) HPI   Sandy Valencia is a 54 y.o. female who presents for evaluation of near syncope, and left arm heaviness. 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 About 30 minutes ago she developed a heaviness in her left arm. She denies chest pain or back pain. She had diaphoresis earlier, when she felt near syncopal. She has never had problems like this previously. She has been taking her usual medicines. She states that her sugar has been doing well. She denies cardiac history. She denies stress at home, or at work. There are no other known modifying factors.   Past Medical History  Diagnosis Date  . Diabetes mellitus   . GERD (gastroesophageal reflux disease)   . Arthritis   . Hypertension    Past Surgical History  Procedure Laterality Date  . Abdominal hysterectomy    . Cholecystectomy    . Middle ear surgery    . Colonoscopy N/A 09/22/2012    Procedure: COLONOSCOPY;  Surgeon: Daneil Dolin, MD;  [facility]: [facility];  Service: Endoscopy;  Laterality: N/A;  9;45  . Esophagogastroduodenoscopy N/A 10/21/2014    Procedure: ESOPHAGOGASTRODUODENOSCOPY (EGD);  Surgeon: Daneil Dolin, MD;  Location: [facility];  Service: Endoscopy;  Laterality: N/A;  1400 - office to pt to arrive at 1:00, maybe 1:15 before pt can get here  . Maloney dilation N/A 10/21/2014    Procedure: [name] DILATION;  Surgeon: Daneil Dolin, MD;  Location: [facility];  Service: Endoscopy;  Laterality: N/A;   Family History  Problem Relation Age of Onset  . Colon cancer Mother     diagnosed age 53, underwent surgical resection, metastatic disease  several years later  . Diabetes Mother   . Heart attack Father   . Diabetes Sister   . Hypothyroidism Brother   . Diabetes Sister   . Diabetes Sister   . Diabetes Sister   . Hypothyroidism Sister    History  Substance Use Topics  . Smoking status: Never Smoker   . Smokeless tobacco: Never Used  . Alcohol Use: No   OB History    No data available     Review of Systems  All other systems reviewed and are negative.     Allergies  Review of patient's allergies indicates no known allergies.  Home Medications   Prior to Admission medications   Medication Sig Start Date End Date Taking? Authorizing Provider  aspirin EC 81 MG tablet Take 81 mg by mouth daily.   Yes Historical Provider, MD  beclomethasone (QVAR) 80 MCG/ACT inhaler Inhale 2 puffs into the lungs 2 (two) times daily.   Yes Historical Provider, MD  Canagliflozin (INVOKANA) 100 MG TABS Take 100 mg by mouth daily.   Yes Historical Provider, MD  Cholecalciferol (VITAMIN D-3) 1000 UNITS CAPS Take 1 capsule by mouth daily.   Yes Historical Provider, MD  HYDROcodone-acetaminophen (NORCO) 7.5-325 MG per tablet Take 1 tablet by mouth every 4 (four) hours as needed for pain.   Yes Historical Provider, MD  insulin glargine (LANTUS SOLOSTAR) 100 UNIT/ML injection Inject 60 Units into the skin at bedtime.    Yes Historical Provider,  MD  NOVOLOG FLEXPEN 100 UNIT/ML injection Inject 15-17 Units into the skin 3 (three) times daily with meals. If sugar levels are elevated, use sliding scale 11/05/11  Yes Historical Provider, MD  omeprazole (PRILOSEC) 20 MG capsule Take 1 capsule by mouth Daily. 11/05/11  Yes Historical Provider, MD  pantoprazole (PROTONIX) 20 MG tablet Take 20 mg by mouth daily.   Yes Historical Provider, MD  vitamin B-12 (CYANOCOBALAMIN) 100 MCG tablet Take 100 mcg by mouth daily.   Yes Historical Provider, MD  diclofenac (VOLTAREN) 50 MG EC tablet Take 1 tablet (50 mg total) by mouth 2 (two) times daily. Patient not  taking: Reported on 12/26/2014 10/31/14   Ashley Murrain, NP  diphenoxylate-atropine (LOMOTIL) 2.5-0.025 MG per tablet Take 1 tablet by mouth 4 (four) times daily as needed for diarrhea or loose stools. Patient not taking: Reported on 10/20/2014 07/15/13   Nat Christen, MD  promethazine (PHENERGAN) 25 MG tablet Take 1 tablet (25 mg total) by mouth every 6 (six) hours as needed. Patient not taking: Reported on 10/20/2014 07/15/13   Nat Christen, MD   BP 119/49 mmHg  Pulse 82  Temp(Src) 98.8 F (37.1 C) (Oral)  Resp 22  Ht 5\' 5"  (1.651 m)  Wt 232 lb (105.235 kg)  BMI 38.61 kg/m2  SpO2 94% Physical Exam  Constitutional: She is oriented to person, place, and time. She appears well-developed.  Obese, alert, cooperative.  HENT:  Head: Normocephalic and atraumatic.  Right Ear: External ear normal.  Left Ear: External ear normal.  Eyes: Conjunctivae and EOM are normal. Pupils are equal, round, and reactive to light.  Neck: Normal range of motion and phonation normal. Neck supple.  Cardiovascular: Normal rate, regular rhythm and normal heart sounds.   Pulmonary/Chest: Effort normal and breath sounds normal. No respiratory distress. She has no wheezes. She exhibits no bony tenderness.  Abdominal: Soft. There is no tenderness.  Musculoskeletal: Normal range of motion.  Neurological: She is alert and oriented to person, place, and time. No cranial nerve deficit or sensory deficit. She exhibits normal muscle tone. Coordination normal.  Skin: Skin is warm, dry and intact.  Psychiatric: She has a normal mood and affect. Her behavior is normal. Judgment and thought content normal.  Nursing note and vitals reviewed.   ED Course  Procedures (including critical care time)  Heart score, by heart pathway- 3; less than 1% chance of major cardiac event within 30 days   Medications  sodium chloride 0.9 % bolus 1,000 mL (0 mLs Intravenous Stopped 12/26/14 2204)  potassium chloride SA (K-DUR,KLOR-CON) CR tablet  40 mEq (40 mEq Oral Given 12/26/14 2203)    Patient Vitals for the past 24 hrs:  BP Temp Temp src Pulse Resp SpO2 Height Weight  12/26/14 2132 (!) 119/49 mmHg - - 82 22 94 % - -  12/26/14 2039 147/59 mmHg - - (!) 206 19 (!) 70 % - -  12/26/14 2008 169/81 mmHg - - - - - - -  12/26/14 2006 (!) 190/101 mmHg 98.8 F (37.1 C) Oral 98 20 99 % 5\' 5"  (1.651 m) 232 lb (105.235 kg)    10:04 PM Reevaluation with update and discussion. After initial assessment and treatment, an updated evaluation reveals she states that she feels better and heaviness has resolved. She has been tolerating water. She would like to try eating some now. She understands that a second troponin will be necessary. This will be done at 23:45. [place]  Review Labs Reviewed  BASIC METABOLIC PANEL - Abnormal; Notable for the following:    Potassium 3.3 (*)    Glucose, Bld 126 (*)    BUN 21 (*)    All other components within normal limits  CBC WITH DIFFERENTIAL/PLATELET - Abnormal; Notable for the following:    RBC 5.17 (*)    All other components within normal limits  URINALYSIS, ROUTINE W REFLEX MICROSCOPIC (NOT AT [facility]) - Abnormal; Notable for the following:    Specific Gravity, Urine >1.030 (*)    Glucose, UA >1000 (*)    Ketones, ur 15 (*)    All other components within normal limits  D-DIMER, QUANTITATIVE (NOT AT [facility])  URINE MICROSCOPIC-ADD ON  I-STAT TROPOININ, ED    Imaging Review No results found.   EKG Interpretation   Date/Time:  Sunday December 26 2014 20:43:05 EDT Ventricular Rate:  83 PR Interval:  162 QRS Duration: 99 QT Interval:  359 QTC Calculation: 422 R Axis:   56 Text Interpretation:  Sinus rhythm Low voltage, precordial leads Minimal  ST depression, inferior leads since last tracing no significant change  Confirmed by Blade Scheff  MD, Ryden Wainer ([id number]) on 12/26/2014 8:52:13 PM      MDM   Final diagnoses:  Near syncope  Hypokalemia    Patient with atypical pain for cardiac  disease (left arm heaviness). Presenting for evaluation of near-syncope. Initial evaluation negative. She required a second troponin, prior to discharge.  Nursing Notes Reviewed/ Care Coordinated, and agree without changes. Applicable Imaging Reviewed.  Interpretation of Laboratory Data incorporated into ED treatment  Plan- DC home with encouragement. 4. Increase fluid intake, if her second troponin is negative  Daleen Bo, MD 12/27/14 2358

## 2014-12-27 ENCOUNTER — Ambulatory Visit (HOSPITAL_COMMUNITY): Payer: Medicaid Other | Admitting: Physical Therapy

## 2014-12-27 LAB — I-STAT TROPONIN, ED: TROPONIN I, POC: 0.01 ng/mL (ref 0.00–0.08)

## 2014-12-27 NOTE — ED Notes (Signed)
Pt alert & oriented x4, stable gait. Patient given discharge instructions, paperwork & prescription(s). Patient  instructed to stop at the registration desk to finish any additional paperwork. Patient verbalized understanding. Pt left department w/ no further questions. 

## 2014-12-27 NOTE — ED Provider Notes (Signed)
Reevaluated - pt asymptomatic throughout stay - labs with low K, replaced, trop X 2 neg, pt well appearing and stable for d/c - instructed on results and indication for return - she is in agreement. 12:07 AM   Noemi Chapel, MD 12/27/14 [phone]

## 2014-12-29 ENCOUNTER — Ambulatory Visit (HOSPITAL_COMMUNITY): Payer: Medicaid Other

## 2014-12-29 ENCOUNTER — Telehealth (HOSPITAL_COMMUNITY): Payer: Self-pay

## 2014-12-29 NOTE — Telephone Encounter (Signed)
No show, called and spoke to pt. who stated she just got into contact with insurance company that is going to cover her PT.  Pt stated she is to receive information to complete in mail for coverage and wishes to cancel apts right now and will call back when insurance coverage is affective.  Reports she has been completing HEP exercises and reports ease following exercises.  Pt given contact information and receptionist informed to put on wait list.  Ihor Austin, [place]; CBIS [phone]

## 2015-01-03 ENCOUNTER — Encounter (HOSPITAL_COMMUNITY): Payer: Medicaid Other | Admitting: Physical Therapy

## 2015-01-05 ENCOUNTER — Encounter (HOSPITAL_COMMUNITY): Payer: Medicaid Other | Admitting: Physical Therapy

## 2015-01-10 ENCOUNTER — Encounter (HOSPITAL_COMMUNITY): Payer: Medicaid Other | Admitting: Physical Therapy

## 2015-01-12 ENCOUNTER — Encounter (HOSPITAL_COMMUNITY): Payer: Medicaid Other | Admitting: Physical Therapy

## 2015-01-17 ENCOUNTER — Ambulatory Visit: Payer: Medicaid Other | Admitting: Nutrition

## 2015-01-17 ENCOUNTER — Encounter (HOSPITAL_COMMUNITY): Payer: Medicaid Other | Admitting: Physical Therapy

## 2015-01-17 NOTE — Progress Notes (Signed)
   Assessment:  Primary concerns today Chart opened in error. Pt didn't show up for appointment.

## 2015-01-19 ENCOUNTER — Encounter (HOSPITAL_COMMUNITY): Payer: Medicaid Other | Admitting: Physical Therapy

## 2015-01-21 ENCOUNTER — Telehealth: Payer: Self-pay | Admitting: Gastroenterology

## 2015-01-21 ENCOUNTER — Ambulatory Visit: Payer: Medicaid Other | Admitting: Gastroenterology

## 2015-01-21 ENCOUNTER — Encounter: Payer: Self-pay | Admitting: Gastroenterology

## 2015-01-21 NOTE — Telephone Encounter (Signed)
Pt was a no show

## 2015-01-21 NOTE — Telephone Encounter (Signed)
Letter mailed

## 2015-02-09 LAB — IFOBT (OCCULT BLOOD): IFOBT: NEGATIVE

## 2015-02-14 ENCOUNTER — Ambulatory Visit (INDEPENDENT_AMBULATORY_CARE_PROVIDER_SITE_OTHER): Payer: Medicaid Other | Admitting: Gastroenterology

## 2015-02-14 ENCOUNTER — Encounter: Payer: Self-pay | Admitting: Gastroenterology

## 2015-02-14 VITALS — BP 156/90 | HR 76 | Temp 96.9°F | Ht 65.0 in | Wt 243.4 lb

## 2015-02-14 DIAGNOSIS — D509 Iron deficiency anemia, unspecified: Secondary | ICD-10-CM

## 2015-02-14 DIAGNOSIS — K760 Fatty (change of) liver, not elsewhere classified: Secondary | ICD-10-CM

## 2015-02-14 DIAGNOSIS — K219 Gastro-esophageal reflux disease without esophagitis: Secondary | ICD-10-CM | POA: Diagnosis not present

## 2015-02-14 HISTORY — DX: Iron deficiency anemia, unspecified: D50.9

## 2015-02-14 NOTE — Assessment & Plan Note (Signed)
Doing very well with change in therapy. She is also lost 19 pounds which is helped her reflux symptoms. Congratulated her on her weight loss efforts. Continue protonix 40 mg daily.

## 2015-02-14 NOTE — Progress Notes (Signed)
Primary Care Physician: Alonza Bogus, MD  Primary Gastroenterologist:  Garfield Cornea, MD   Chief Complaint  Patient presents with  . Follow-up    HPI: Sandy Valencia is a 54 y.o. female here for three-month follow-up. She no showed her last visit. She underwent an EGD in March 2016 for breakthrough reflux, esophageal dysphagia. She has small hiatal hernia but otherwise normal EGD. Empirical dilation was performed for history of dysphagia. She was switched from omeprazole to Protonix. Her last colonoscopy was in April 2014 for colon cancer screening, mother had history of colon cancer but at an advanced age. She had a hyperplastic rectal polyp. 6 mm polyp from the distal transverse colon removed, tubular adenoma. Plans for 5 year follow-up colonoscopy for history of tubular adenomas.  States Dr. Luan Pulling recently checked her iron level and it was low. Return 3 Hemoccults last week but hasn't heard of results. States her hemoglobin has been normal. Wonders if it's related to dietary changes. Adkins diet for 4-5 months, has lost 19 pounds. She continues to eat red meat and lots of vegetables. Avoids carbs. BM regular. No melena, brbpr. No abdominal pain. No longer clearing throat. Now on protonix. No heartburn. No dysphagia.    Current Outpatient Prescriptions  Medication Sig Dispense Refill  . aspirin EC 81 MG tablet Take 81 mg by mouth daily.    . beclomethasone (QVAR) 80 MCG/ACT inhaler Inhale 2 puffs into the lungs 2 (two) times daily.    . Canagliflozin (INVOKANA) 100 MG TABS Take 100 mg by mouth daily.    . Cholecalciferol (VITAMIN D-3) 1000 UNITS CAPS Take 1 capsule by mouth daily.    Marland Kitchen HYDROcodone-acetaminophen (NORCO) 7.5-325 MG per tablet Take 1 tablet by mouth every 4 (four) hours as needed for pain.    Marland Kitchen insulin glargine (LANTUS SOLOSTAR) 100 UNIT/ML injection Inject 60 Units into the skin at bedtime.     Marland Kitchen NOVOLOG FLEXPEN 100 UNIT/ML injection Inject 15-17 Units into the  skin 3 (three) times daily with meals. If sugar levels are elevated, use sliding scale    . pantoprazole (PROTONIX) 40 MG tablet Take 40 mg by mouth daily.    . vitamin B-12 (CYANOCOBALAMIN) 100 MCG tablet Take 100 mcg by mouth daily.     No current facility-administered medications for this visit.    Allergies as of 02/14/2015  . (No Known Allergies)   Past Medical History  Diagnosis Date  . Diabetes mellitus   . GERD (gastroesophageal reflux disease)   . Arthritis   . Hypertension    Past Surgical History  Procedure Laterality Date  . Abdominal hysterectomy    . Cholecystectomy    . Middle ear surgery    . Colonoscopy N/A 09/22/2012    RMR: colonic polyps -removed as described above. tubular adenoma, next TCS 09/2017  . Esophagogastroduodenoscopy N/A 10/21/2014    RMR: Small hiatal hernia; otherwise normal EGD status post passage of a Maloney dilator.   [name] dilation N/A 10/21/2014    Procedure: [name] DILATION;  Surgeon: Daneil Dolin, MD;  [facility];  Service: Endoscopy;  Laterality: N/A;    ROS:  General: Negative for anorexia, weight loss, fever, chills, fatigue, weakness. ENT: Negative for hoarseness, difficulty swallowing , nasal congestion. CV: Negative for chest pain, angina, palpitations, dyspnea on exertion, peripheral edema.  Respiratory: Negative for dyspnea at rest, dyspnea on exertion, cough, sputum, wheezing.  GI: See history of present illness. GU:  Negative for  dysuria, hematuria, urinary incontinence, urinary frequency, nocturnal urination.  Endo: Negative for unusual weight change.    Physical Examination:   BP 156/90 mmHg  Pulse 76  Temp(Src) 96.9 F (36.1 C) (Oral)  Ht 5\' 5"  (1.651 m)  Wt 243 lb 6.4 oz (110.406 kg)  BMI 40.50 kg/m2  General: Well-nourished, well-developed in no acute distress.  Eyes: No icterus. Mouth: Oropharyngeal mucosa moist and pink , no lesions erythema or exudate. Lungs: Clear to auscultation  bilaterally.  Heart: Regular rate and rhythm, no murmurs rubs or gallops.  Abdomen: Bowel sounds are normal, nontender, nondistended, no hepatosplenomegaly or masses, no abdominal bruits or hernia , no rebound or guarding.   Extremities: No lower extremity edema. No clubbing or deformities. Neuro: Alert and oriented x 4   Skin: Warm and dry, no jaundice.   Psych: Alert and cooperative, normal mood and affect.  Labs:  Lab Results  Component Value Date   WBC 7.3 12/26/2014   HGB 14.6 12/26/2014   HCT 45.8 12/26/2014   MCV 88.6 12/26/2014   PLT 250 12/26/2014   Lab Results  Component Value Date   CREATININE 0.76 12/26/2014   BUN 21* 12/26/2014   NA 139 12/26/2014   K 3.3* 12/26/2014   CL 103 12/26/2014   CO2 25 12/26/2014   Lab Results  Component Value Date   ALT 47* 07/15/2013   AST 32 07/15/2013   ALKPHOS 67 07/15/2013   BILITOT 0.5 07/15/2013     Imaging Studies: No results found.

## 2015-02-14 NOTE — Assessment & Plan Note (Signed)
Patient reports recent abnormal iron level. Hemoccult cards returned but she's not aware of the results. We have requested records for further review.

## 2015-02-14 NOTE — Progress Notes (Signed)
cc'ed to pcp °

## 2015-02-14 NOTE — Patient Instructions (Signed)
1. We will contact you once we have reviewed recent labs and hemoccult cards from Dr. Luan Pulling office.

## 2015-02-14 NOTE — Assessment & Plan Note (Addendum)
Fatty liver on previous imaging in 2006. To my knowledge she's not had any sort of follow-up. AST minimally elevated last year.  Instructions for fatty liver: Recommend 1-2# weight loss per week until ideal body weight through exercise & diet. Low fat/cholesterol diet.   Avoid sweets, sodas, fruit juices, sweetened beverages like tea, etc. Gradually increase exercise from 15 min daily up to 1 hr per day 5 days/week. Limit alcohol use.  Await retrieval lab records for further review. Further recommendations to follow.

## 2015-02-16 ENCOUNTER — Telehealth: Payer: Self-pay

## 2015-02-16 NOTE — Telephone Encounter (Signed)
Pt called wanting to know if [facility] had ever got the results from her PCP in regards to her stool sample. If so pt can be reached at [phone]

## 2015-02-23 NOTE — Telephone Encounter (Signed)
Please let patient know that her stools were negative for blood X 3. Her iron labs NOT c/w Iron deficiency anemia but possible trend towards IDA.Marland Kitchen  Recommend repeat labs in 4 weeks. Iron/TIBC, diagnosis iron deficiency Ferritin CBC LFTs, diagnosis fatty liver

## 2015-02-23 NOTE — Progress Notes (Signed)
See separate telephone note dated 02/16/15 for f/u of records.

## 2015-02-24 NOTE — Telephone Encounter (Signed)
Pt is aware. Lab orders done and will be mailed to the pt.

## 2015-03-01 ENCOUNTER — Other Ambulatory Visit: Payer: Self-pay | Admitting: Gastroenterology

## 2015-03-01 DIAGNOSIS — K760 Fatty (change of) liver, not elsewhere classified: Secondary | ICD-10-CM

## 2015-03-01 DIAGNOSIS — D509 Iron deficiency anemia, unspecified: Secondary | ICD-10-CM

## 2015-03-05 ENCOUNTER — Encounter (HOSPITAL_COMMUNITY): Payer: Self-pay | Admitting: *Deleted

## 2015-03-05 ENCOUNTER — Emergency Department (HOSPITAL_COMMUNITY)
Admission: EM | Admit: 2015-03-05 | Discharge: 2015-03-05 | Disposition: A | Discharge status: Home / Self Care | Payer: Medicaid Other | Attending: Emergency Medicine | Admitting: Emergency Medicine

## 2015-03-05 DIAGNOSIS — Z79899 Other long term (current) drug therapy: Secondary | ICD-10-CM | POA: Insufficient documentation

## 2015-03-05 DIAGNOSIS — Z794 Long term (current) use of insulin: Secondary | ICD-10-CM | POA: Insufficient documentation

## 2015-03-05 DIAGNOSIS — E119 Type 2 diabetes mellitus without complications: Secondary | ICD-10-CM | POA: Insufficient documentation

## 2015-03-05 DIAGNOSIS — M1990 Unspecified osteoarthritis, unspecified site: Secondary | ICD-10-CM | POA: Diagnosis not present

## 2015-03-05 DIAGNOSIS — Y9389 Activity, other specified: Secondary | ICD-10-CM | POA: Insufficient documentation

## 2015-03-05 DIAGNOSIS — Z7982 Long term (current) use of aspirin: Secondary | ICD-10-CM | POA: Insufficient documentation

## 2015-03-05 DIAGNOSIS — I10 Essential (primary) hypertension: Secondary | ICD-10-CM | POA: Insufficient documentation

## 2015-03-05 DIAGNOSIS — I83891 Varicose veins of right lower extremities with other complications: Secondary | ICD-10-CM

## 2015-03-05 DIAGNOSIS — W268XXA Contact with other sharp object(s), not elsewhere classified, initial encounter: Secondary | ICD-10-CM | POA: Insufficient documentation

## 2015-03-05 DIAGNOSIS — S79921A Unspecified injury of right thigh, initial encounter: Secondary | ICD-10-CM | POA: Diagnosis present

## 2015-03-05 DIAGNOSIS — S70311A Abrasion, right thigh, initial encounter: Secondary | ICD-10-CM | POA: Insufficient documentation

## 2015-03-05 DIAGNOSIS — Y9289 Other specified places as the place of occurrence of the external cause: Secondary | ICD-10-CM | POA: Diagnosis not present

## 2015-03-05 DIAGNOSIS — Z7951 Long term (current) use of inhaled steroids: Secondary | ICD-10-CM | POA: Diagnosis not present

## 2015-03-05 DIAGNOSIS — K219 Gastro-esophageal reflux disease without esophagitis: Secondary | ICD-10-CM | POA: Insufficient documentation

## 2015-03-05 DIAGNOSIS — Y998 Other external cause status: Secondary | ICD-10-CM | POA: Insufficient documentation

## 2015-03-05 NOTE — ED Notes (Signed)
PA assessed thoroughly and no active bleeding observed at that time . In room at this time applying dressing

## 2015-03-05 NOTE — Discharge Instructions (Signed)
Bleeding Varicose Veins Varicose veins are veins that have become enlarged and twisted. Valves in the veins help return blood from the leg to the heart. If these valves are damaged, blood flows backwards and backs up into the veins in the leg near the skin. This causes the veins to become larger because of increased pressure within. Sometimes these veins bleed. CAUSES  Factors that can lead to bleeding varicose veins include:  Thinning of the skin that covers the veins. This skin is stretched as the veins enlarge.  Weak and thinning walls of the varicose veins. These thin walls are part of the reason why blood is not flowing normally to the heart.  Having high pressure in the veins. This high pressure occurs because the blood is not flowing freely back up to the heart.  Injury. Even a small injury to a varicose vein can cause bleeding.  Open wounds. A sore may develop near a varicose vein and not heal. This makes bleeding more likely.  Taking medicine that thins the blood. These medicines may include aspirin, anti-inflammatory medicine, and other blood thinners. SYMPTOMS  If bleeding is on the outside surface of the skin, blood can be seen. Sometimes, the bleeding stays under the skin. If this happens, the blue or purple area will spread beyond the vein. This discoloration may be visible. DIAGNOSIS  To decide if you have a bleeding varicose vein, your caregiver may:  Ask about your symptoms. This will include when you first saw bleeding.  Ask about how long you have had varicose veins and if they cause you problems.  Ask about your overall health.  Ask about possible causes, like recent cuts or if the area near the varicose veins was bumped or injured.  Examine the skin or leg that concerns you. Your caregiver will probably feel the veins.  Order imaging tests. These create detailed pictures of the veins. TREATMENT  The first goal of treating bleeding varicose veins is to stop the  bleeding. Then, the aim is to keep any bleeding from happening again. Treatment will depend on the cause of the bleeding and how bad it is. Ask your caregiver about what would be best for you. Options include:  Raising (elevating) your leg. Lie down with your leg propped up on a pillow or cushion. Your foot should be above your heart.  Applying pressure to the spot that is bleeding. The bleeding should stop in a short time.  Wearing elastic stocking that "compress" your legs (compression stockings). An elastic bandage may do the same thing.  Applying an antibiotic cream on sores that are not healing.  Surgically removing or closing off the bleeding varicose veins. HOME CARE INSTRUCTIONS   Apply any creams that your caregiver prescribed. Follow the directions carefully.  Wear compression stockings or any special wraps that were prescribed. Make sure you know:  If you should wear them every day.  How long you should wear them.  If veins were removed or closed, a bandage (dressing) will probably cover the area. Make sure you know:  How often the dressing should be changed.  Whether the area can get wet.  When you can leave the skin uncovered.  Check your skin every day. Look for new sores and signs of bleeding.  To prevent future bleeding:  Use extra care in situations where you could cut your legs. Shaving, for example, or working outside in the garden.  Try to keep your legs elevated as much as possible. Lie down  when you can. SEEK MEDICAL CARE IF:   You have any questions about how to wear compression stockings or elastic bandages.  Your veins continue to bleed.  Sores develop near your varicose veins.  You have a sore that does not heal or gets bigger.  Pain increases in your leg.  The area around a varicose vein becomes warm, red, or tender to the touch.  You notice a yellowish fluid that smells bad coming from a spot where there was bleeding.  You develop a  fever of more than 100.5 F (38.1 C). SEEK IMMEDIATE MEDICAL CARE IF:   You develop a fever of more than 102 F (38.9 C). Document Released: 10/07/2008 Document Revised: 08/13/2011 Document Reviewed: 09/22/2013 [facility] Patient Information 2015 [place], [place]. This information is not intended to replace advice given to you by your health care provider. Make sure you discuss any questions you have with your health care provider.

## 2015-03-05 NOTE — ED Notes (Signed)
Pt cut the right inner thigh with a razor in an area with vericos veins.

## 2015-03-05 NOTE — ED Provider Notes (Signed)
CSN: [id number]     Arrival date & time 03/05/15  1946 History   First MD Initiated Contact with Patient 03/05/15 2001     Chief Complaint  Patient presents with  . Extremity Laceration     (Consider location/radiation/quality/duration/timing/severity/associated sxs/prior Treatment) HPI   Sandy Valencia is a 54 y.o. female who presents to the Emergency Department complaining of pin point area of bleeding to her right inner thigh that occurred while shaving her legs with a razor and "nicked" a varicose vein.  She reports consistent bleeding for 30 minutes , she has been holding pressure.  She admits to daily 81 mg ASA use.  She denies swelling, numbness or bleeding disorders.   Past Medical History  Diagnosis Date  . Diabetes mellitus   . GERD (gastroesophageal reflux disease)   . Arthritis   . Hypertension   . Fatty liver    Past Surgical History  Procedure Laterality Date  . Abdominal hysterectomy    . Cholecystectomy    . Middle ear surgery    . Colonoscopy N/A 09/22/2012    RMR: colonic polyps -removed as described above. tubular adenoma, next TCS 09/2017  . Esophagogastroduodenoscopy N/A 10/21/2014    RMR: Small hiatal hernia; otherwise normal EGD status post passage of a Maloney dilator.   [name] dilation N/A 10/21/2014    Procedure: [name] DILATION;  Surgeon: Daneil Dolin, MD;  Location: [facility];  Service: Endoscopy;  Laterality: N/A;   Family History  Problem Relation Age of Onset  . Colon cancer Mother     diagnosed age 56, underwent surgical resection, metastatic disease several years later  . Diabetes Mother   . Heart attack Father   . Diabetes Sister   . Hypothyroidism Brother   . Diabetes Sister   . Diabetes Sister   . Diabetes Sister   . Hypothyroidism Sister    Social History  Substance Use Topics  . Smoking status: Never Smoker   . Smokeless tobacco: Never Used  . Alcohol Use: No   OB History    No data available     Review of Systems   Constitutional: Negative for fever and chills.  Musculoskeletal: Negative for back pain, joint swelling and arthralgias.  Skin: Positive for wound.       Small lac right medial thigh  Neurological: Negative for dizziness, weakness and numbness.  Hematological: Does not bruise/bleed easily.  All other systems reviewed and are negative.     Allergies  Review of patient's allergies indicates no known allergies.  Home Medications   Prior to Admission medications   Medication Sig Start Date End Date Taking? Authorizing Provider  aspirin EC 81 MG tablet Take 81 mg by mouth daily.    Historical Provider, MD  beclomethasone (QVAR) 80 MCG/ACT inhaler Inhale 2 puffs into the lungs 2 (two) times daily.    Historical Provider, MD  Canagliflozin (INVOKANA) 100 MG TABS Take 100 mg by mouth daily.    Historical Provider, MD  Cholecalciferol (VITAMIN D-3) 1000 UNITS CAPS Take 1 capsule by mouth daily.    Historical Provider, MD  HYDROcodone-acetaminophen (NORCO) 7.5-325 MG per tablet Take 1 tablet by mouth every 4 (four) hours as needed for pain.    Historical Provider, MD  insulin glargine (LANTUS SOLOSTAR) 100 UNIT/ML injection Inject 60 Units into the skin at bedtime.     Historical Provider, MD  NOVOLOG FLEXPEN 100 UNIT/ML injection Inject 15-17 Units into the skin 3 (three) times daily with meals.  If sugar levels are elevated, use sliding scale 11/05/11   Historical Provider, MD  pantoprazole (PROTONIX) 40 MG tablet Take 40 mg by mouth daily.    Historical Provider, MD  vitamin B-12 (CYANOCOBALAMIN) 100 MCG tablet Take 100 mcg by mouth daily.    Historical Provider, MD   BP 141/94 mmHg  Pulse 95  Temp(Src) 98.1 F (36.7 C) (Oral)  Resp 20  Ht 5\' 5"  (1.651 m)  Wt 242 lb (109.77 kg)  BMI 40.27 kg/m2  SpO2 100% Physical Exam  Constitutional: She is oriented to person, place, and time. She appears well-developed and well-nourished. No distress.  Cardiovascular: Normal rate, regular rhythm and  intact distal pulses.   Pulmonary/Chest: Effort normal and breath sounds normal.  Musculoskeletal: She exhibits tenderness.  Right  DP pulse brisk, distal sensation intact. Calf is soft and NT.  Neurological: She is alert and oriented to person, place, and time. She exhibits normal muscle tone. Coordination normal.  Skin: Skin is warm and dry. No erythema.  Pin point abrasion to the right medial thigh. Small varicosities present.  Bleeding controlled.  No edema.    Nursing note and vitals reviewed.   ED Course  Procedures (including critical care time) Labs Review Labs Reviewed - No data to display  Imaging Review No results found. I have personally reviewed and evaluated these images and lab results as part of my medical decision-making.   EKG Interpretation None      MDM   Final diagnoses:  Bleeding from varicose vein, right    Pin point area of bleeding over a varicosity of the right upper thigh.  bleeding controlled after pressure prior to ed arrival, but a quick clot dressing was applied.  Pt observed and bleeding is controlled. Ambulates well.   Agrees to elevate, minimal wt bearing and advised to return if needed.     Maie Kesinger, PA-C 03/06/15 2007  Orpah Greek, MD 03/06/15 2051

## 2015-03-11 ENCOUNTER — Other Ambulatory Visit: Payer: Self-pay | Admitting: Gastroenterology

## 2015-03-11 ENCOUNTER — Other Ambulatory Visit: Payer: Self-pay

## 2015-03-11 DIAGNOSIS — K760 Fatty (change of) liver, not elsewhere classified: Secondary | ICD-10-CM

## 2015-03-11 DIAGNOSIS — D509 Iron deficiency anemia, unspecified: Secondary | ICD-10-CM

## 2015-03-24 ENCOUNTER — Ambulatory Visit (INDEPENDENT_AMBULATORY_CARE_PROVIDER_SITE_OTHER): Payer: Medicaid Other | Admitting: Otolaryngology

## 2015-03-24 DIAGNOSIS — H95121 Granulation of postmastoidectomy cavity, right ear: Secondary | ICD-10-CM | POA: Diagnosis not present

## 2015-03-24 LAB — CBC WITH DIFFERENTIAL/PLATELET
BASOS PCT: 0 % (ref 0–1)
Basophils Absolute: 0 10*3/uL (ref 0.0–0.1)
EOS ABS: 0.1 10*3/uL (ref 0.0–0.7)
Eosinophils Relative: 3 % (ref 0–5)
HCT: 43.1 % (ref 36.0–46.0)
Hemoglobin: 14.1 g/dL (ref 12.0–15.0)
Lymphocytes Relative: 34 % (ref 12–46)
Lymphs Abs: 1.6 10*3/uL (ref 0.7–4.0)
MCH: 27.8 pg (ref 26.0–34.0)
MCHC: 32.7 g/dL (ref 30.0–36.0)
MCV: 84.8 fL (ref 78.0–100.0)
MONOS PCT: 9 % (ref 3–12)
MPV: 10.3 fL (ref 8.6–12.4)
Monocytes Absolute: 0.4 10*3/uL (ref 0.1–1.0)
NEUTROS PCT: 54 % (ref 43–77)
Neutro Abs: 2.5 10*3/uL (ref 1.7–7.7)
PLATELETS: 250 10*3/uL (ref 150–400)
RBC: 5.08 MIL/uL (ref 3.87–5.11)
RDW: 15 % (ref 11.5–15.5)
WBC: 4.7 10*3/uL (ref 4.0–10.5)

## 2015-03-24 LAB — IRON AND TIBC
%SAT: 22 % (ref 11–50)
IRON: 68 ug/dL (ref 45–160)
TIBC: 306 ug/dL (ref 250–450)
UIBC: 238 ug/dL (ref 125–400)

## 2015-03-24 LAB — HEPATIC FUNCTION PANEL
ALBUMIN: 4.2 g/dL (ref 3.6–5.1)
ALT: 28 U/L (ref 6–29)
AST: 19 U/L (ref 10–35)
Alkaline Phosphatase: 72 U/L (ref 33–130)
Bilirubin, Direct: 0.1 mg/dL (ref <=0.2)
Indirect Bilirubin: 0.4 mg/dL (ref 0.2–1.2)
TOTAL PROTEIN: 6.4 g/dL (ref 6.1–8.1)
Total Bilirubin: 0.5 mg/dL (ref 0.2–1.2)

## 2015-03-24 LAB — FERRITIN: Ferritin: 223 ng/mL (ref 10–291)

## 2015-03-28 ENCOUNTER — Telehealth: Payer: Self-pay | Admitting: Internal Medicine

## 2015-03-28 NOTE — Progress Notes (Signed)
ON RECALL  °

## 2015-03-28 NOTE — Telephone Encounter (Signed)
Pt is aware of her lab results.  

## 2015-03-28 NOTE — Telephone Encounter (Signed)
[phone] patient inquiring about lab results

## 2015-03-28 NOTE — Progress Notes (Signed)
Quick Note:  Please let patient know her labs look great! LFTs normal. No evidence of anemia. Recommend follow up in one year or sooner if any problems. ______

## 2015-04-13 ENCOUNTER — Other Ambulatory Visit: Payer: Self-pay | Admitting: Internal Medicine

## 2015-05-11 LAB — HEMOGLOBIN A1C: Hgb A1c MFr Bld: 8.7 % — AB (ref 4.0–6.0)

## 2015-05-17 ENCOUNTER — Other Ambulatory Visit: Payer: Self-pay | Admitting: "Endocrinology

## 2015-05-18 ENCOUNTER — Ambulatory Visit (INDEPENDENT_AMBULATORY_CARE_PROVIDER_SITE_OTHER): Payer: Medicaid Other | Admitting: "Endocrinology

## 2015-05-18 ENCOUNTER — Encounter: Payer: Self-pay | Admitting: "Endocrinology

## 2015-05-18 ENCOUNTER — Telehealth: Payer: Self-pay | Admitting: "Endocrinology

## 2015-05-18 VITALS — BP 168/94 | HR 85 | Ht 65.0 in | Wt 241.0 lb

## 2015-05-18 DIAGNOSIS — Z9119 Patient's noncompliance with other medical treatment and regimen: Secondary | ICD-10-CM | POA: Diagnosis not present

## 2015-05-18 DIAGNOSIS — E1165 Type 2 diabetes mellitus with hyperglycemia: Secondary | ICD-10-CM | POA: Insufficient documentation

## 2015-05-18 DIAGNOSIS — I10 Essential (primary) hypertension: Secondary | ICD-10-CM | POA: Diagnosis not present

## 2015-05-18 DIAGNOSIS — Z6838 Body mass index (BMI) 38.0-38.9, adult: Secondary | ICD-10-CM

## 2015-05-18 DIAGNOSIS — Z6841 Body Mass Index (BMI) 40.0 and over, adult: Secondary | ICD-10-CM | POA: Insufficient documentation

## 2015-05-18 DIAGNOSIS — Z91199 Patient's noncompliance with other medical treatment and regimen due to unspecified reason: Secondary | ICD-10-CM

## 2015-05-18 DIAGNOSIS — Z794 Long term (current) use of insulin: Secondary | ICD-10-CM

## 2015-05-18 DIAGNOSIS — E785 Hyperlipidemia, unspecified: Secondary | ICD-10-CM

## 2015-05-18 DIAGNOSIS — E7800 Pure hypercholesterolemia, unspecified: Secondary | ICD-10-CM | POA: Insufficient documentation

## 2015-05-18 DIAGNOSIS — E118 Type 2 diabetes mellitus with unspecified complications: Secondary | ICD-10-CM

## 2015-05-18 DIAGNOSIS — IMO0002 Reserved for concepts with insufficient information to code with codable children: Secondary | ICD-10-CM | POA: Insufficient documentation

## 2015-05-18 HISTORY — DX: Patient's noncompliance with other medical treatment and regimen: Z91.19

## 2015-05-18 HISTORY — DX: Patient's noncompliance with other medical treatment and regimen due to unspecified reason: Z91.199

## 2015-05-18 MED ORDER — GLUCOSE BLOOD VI STRP: ORAL_STRIP | Status: DC

## 2015-05-18 MED ORDER — INSULIN GLARGINE 100 UNIT/ML SOLOSTAR PEN: 60.0000 [IU] | PEN_INJECTOR | Freq: Every day | SUBCUTANEOUS | Status: DC

## 2015-05-18 MED ORDER — LISINOPRIL-HYDROCHLOROTHIAZIDE 20-25 MG PO TABS: 1.0000 | ORAL_TABLET | Freq: Every day | ORAL | Status: DC

## 2015-05-18 MED ORDER — LANCETS MISC: 1.0000 | Status: DC

## 2015-05-18 MED ORDER — INSULIN PEN NEEDLE 31G X 8 MM MISC: 1.0000 | Status: DC

## 2015-05-18 MED ORDER — INSULIN ASPART 100 UNIT/ML FLEXPEN: 15.0000 [IU] | PEN_INJECTOR | Freq: Three times a day (TID) | SUBCUTANEOUS | Status: DC

## 2015-05-18 NOTE — Patient Instructions (Signed)

## 2015-05-18 NOTE — Progress Notes (Signed)
Subjective:    Patient ID: Sandy Valencia, female    DOB: 1961-05-24, PCP Alonza Bogus, MD   Past Medical History  Diagnosis Date  . Diabetes mellitus   . GERD (gastroesophageal reflux disease)   . Arthritis   . Hypertension   . Fatty liver    Past Surgical History  Procedure Laterality Date  . Abdominal hysterectomy    . Cholecystectomy    . Middle ear surgery    . Colonoscopy N/A 09/22/2012    RMR: colonic polyps -removed as described above. tubular adenoma, next TCS 09/2017  . Esophagogastroduodenoscopy N/A 10/21/2014    RMR: Small hiatal hernia; otherwise normal EGD status post passage of a Maloney dilator.   [name] dilation N/A 10/21/2014    Procedure: [name] DILATION;  Surgeon: Daneil Dolin, MD;  Location: [facility];  Service: Endoscopy;  Laterality: N/A;   Social History   Social History  . Marital Status: Divorced    Spouse Name: N/A  . Number of Children: N/A  . Years of Education: N/A   Social History Main Topics  . Smoking status: Never Smoker   . Smokeless tobacco: Never Used  . Alcohol Use: No  . Drug Use: No  . Sexual Activity: Yes    Birth Control/ Protection: Surgical   Other Topics Concern  . None   Social History Narrative   Outpatient Encounter Prescriptions as of 05/18/2015  Medication Sig  . aspirin EC 81 MG tablet Take 81 mg by mouth daily.  . insulin aspart (NOVOLOG FLEXPEN) 100 UNIT/ML FlexPen Inject 15-21 Units into the skin 3 (three) times daily with meals.  . Insulin Glargine (LANTUS SOLOSTAR) 100 UNIT/ML Solostar Pen Inject 60 Units into the skin daily at 10 pm.  . INVOKANA 100 MG TABS tablet TAKE ONE (1) TABLET EACH DAY  . Multiple Vitamin (MULTIVITAMIN) tablet Take 1 tablet by mouth daily.  Marland Kitchen omeprazole (PRILOSEC) 20 MG capsule Take 20 mg by mouth daily.  . simvastatin (ZOCOR) 20 MG tablet Take 20 mg by mouth daily.  . [DISCONTINUED] LANTUS SOLOSTAR 100 UNIT/ML Solostar Pen USE 60 UNITS SUBCUTANEOUSLY TWICE A DAY  .  [DISCONTINUED] NOVOLOG FLEXPEN 100 UNIT/ML FlexPen INJECT 25 UNITS S.Q. THREE TIMES DAILY (Patient taking differently: INJECT 15 UNITS S.Q. THREE TIMES DAILY)  . beclomethasone (QVAR) 80 MCG/ACT inhaler Inhale 2 puffs into the lungs 2 (two) times daily.  . Cholecalciferol (VITAMIN D-3) 1000 UNITS CAPS Take 1 capsule by mouth daily.  Marland Kitchen glucose blood (ACCU-CHEK COMPACT PLUS) test strip Use as instructed  . HYDROcodone-acetaminophen (NORCO) 7.5-325 MG per tablet Take 1 tablet by mouth every 4 (four) hours as needed for pain.  . Insulin Pen Needle (B-D ULTRAFINE III SHORT PEN) 31G X 8 MM MISC 1 each by Does not apply route as directed.  . Lancets MISC 1 each by Does not apply route as directed.  Marland Kitchen lisinopril-hydrochlorothiazide (PRINZIDE,ZESTORETIC) 20-25 MG tablet Take 1 tablet by mouth daily.  . pantoprazole (PROTONIX) 40 MG tablet TAKE ONE (1) TABLET BY MOUTH EVERY DAY  . vitamin B-12 (CYANOCOBALAMIN) 100 MCG tablet Take 100 mcg by mouth daily.   No facility-administered encounter medications on file as of 05/18/2015.   ALLERGIES: No Known Allergies VACCINATION STATUS: There is no immunization history for the selected administration types on file for this patient.  Diabetes She presents for her follow-up diabetic visit. She has type 2 diabetes mellitus. Her disease course has been worsening (She was diagnosed at approximate age  of 34 years.). There are no hypoglycemic associated symptoms. Pertinent negatives for hypoglycemia include no confusion, headaches, pallor or seizures. There are no diabetic associated symptoms. Pertinent negatives for diabetes include no chest pain, no polydipsia, no polyphagia and no polyuria. There are no hypoglycemic complications. Symptoms are worsening. There are no diabetic complications. (She has history of noncompliance.) Risk factors for coronary artery disease include dyslipidemia, diabetes mellitus, hypertension, sedentary lifestyle and tobacco exposure. Current  diabetic treatment includes insulin injections (She only administered 50% of prescribed insulin.). She is compliant with treatment some of the time. She never participates in exercise. Her home blood glucose trend is increasing steadily. Her breakfast blood glucose range is generally 180-200 mg/dl. Her dinner blood glucose range is generally >200 mg/dl. Her overall blood glucose range is >200 mg/dl. An ACE inhibitor/angiotensin II receptor blocker is being taken.  Hyperlipidemia This is a chronic problem. The current episode started more than 1 year ago. The problem is uncontrolled. Pertinent negatives include no chest pain, myalgias or shortness of breath. Current antihyperlipidemic treatment includes statins. Risk factors for coronary artery disease include diabetes mellitus, dyslipidemia, hypertension, obesity and a sedentary lifestyle.  Hypertension This is a chronic problem. The current episode started more than 1 year ago. The problem is uncontrolled. Pertinent negatives include no chest pain, headaches, palpitations or shortness of breath. Risk factors for coronary artery disease include dyslipidemia, diabetes mellitus, obesity, sedentary lifestyle and smoking/tobacco exposure. Past treatments include nothing.     Review of Systems  Constitutional: Negative for unexpected weight change.  HENT: Negative for trouble swallowing and voice change.   Eyes: Negative for visual disturbance.  Respiratory: Negative for cough, shortness of breath and wheezing.   Cardiovascular: Negative for chest pain, palpitations and leg swelling.  Gastrointestinal: Negative for nausea, vomiting and diarrhea.  Endocrine: Negative for cold intolerance, heat intolerance, polydipsia, polyphagia and polyuria.  Musculoskeletal: Negative for myalgias and arthralgias.  Skin: Negative for color change, pallor, rash and wound.  Neurological: Negative for seizures and headaches.  Psychiatric/Behavioral: Negative for suicidal  ideas and confusion.    Objective:    BP 168/94 mmHg  Pulse 85  Ht 5\' 5"  (1.651 m)  Wt 241 lb (109.317 kg)  BMI 40.10 kg/m2  SpO2 95%  Wt Readings from Last 3 Encounters:  05/18/15 241 lb (109.317 kg)  03/05/15 242 lb (109.77 kg)  02/14/15 243 lb 6.4 oz (110.406 kg)    Physical Exam  Constitutional: She is oriented to person, place, and time. She appears well-developed.  HENT:  Head: Normocephalic and atraumatic.  Eyes: EOM are normal.  Neck: Normal range of motion. Neck supple. No tracheal deviation present. No thyromegaly present.  Cardiovascular: Normal rate and regular rhythm.   Pulmonary/Chest: Effort normal and breath sounds normal.  Abdominal: Soft. Bowel sounds are normal. There is no tenderness. There is no guarding.  Musculoskeletal: Normal range of motion. She exhibits no edema.  Neurological: She is alert and oriented to person, place, and time. She has normal reflexes. No cranial nerve deficit. Coordination normal.  Skin: Skin is warm and dry. No rash noted. No erythema. No pallor.  Psychiatric: She has a normal mood and affect. Judgment normal.     Complete Blood Count (Most recent): Lab Results  Component Value Date   WBC 4.7 03/23/2015   HGB 14.1 03/23/2015   HCT 43.1 03/23/2015   MCV 84.8 03/23/2015   PLT 250 03/23/2015   Chemistry (most recent): Lab Results  Component Value Date   NA  139 12/26/2014   K 3.3* 12/26/2014   CL 103 12/26/2014   CO2 25 12/26/2014   BUN 21* 12/26/2014   CREATININE 0.76 12/26/2014    Assessment & Plan:   1. Uncontrolled type 2 diabetes mellitus with complication, with long-term current use of insulin ([place])   -her  diabetes is  complicated by noncompliance and patient remains at a high risk for more acute and chronic complications of diabetes which include CAD, CVA, CKD, retinopathy, and neuropathy. These are all discussed in detail with the patient.  Patient came with above target glucose profile, and  recent A1c  of 8.7% unchanged from last visit. -She did not bring her logs. EKG shows 212 for 13 readings in the last 7 days. Recent labs reviewed.   - I have re-counseled the patient on diet management and weight loss  by adopting a carbohydrate restricted / protein rich  Diet.  - Suggestion is made for patient to avoid simple carbohydrates   from their diet including Cakes , Desserts, Ice Cream,  Soda (  diet and regular) , Sweet Tea , Candies,  Chips, Cookies, Artificial Sweeteners,   and "Sugar-free" Products .  This will help patient to have stable blood glucose profile and potentially avoid unintended  Weight gain.  - Patient is advised to stick to a routine mealtimes to eat 3 meals  a day and avoid unnecessary snacks ( to snack only to correct hypoglycemia).  - The patient  has been  scheduled with Jearld Fenton, RDN, CDE for individualized DM education.  - I have approached patient with the following individualized plan to manage diabetes and patient agrees.  - She is advised to resume Lantus 60 qhs, Novolog 15 units TIDAC plus SSI. Pt to continue to monitor BG ac and hs.  - She is advised to keep her appointment with Jearld Fenton, CDE for individualized DM education. I will continue Simvastatin 20mg  po qhs. -Continue Invokana 152mmg po qam. SE discussed again to her.  - Patient will be considered for incretin therapy as appropriate next visit. - Patient specific target  for A1c; LDL, HDL, Triglycerides, and  Waist Circumference were discussed in detail.  2) BP/HTN: Uncontrolled.   I have initiated hydrochlorothiazide/lisinopril 25/20 mg by mouth daily. I counseled her on salt restriction.   3) Lipids/HPL:  continue statins. 4)  Weight/Diet: CDE consult in progress, exercise, and carbohydrates information provided. 5. Personal history of noncompliance with medical treatment, presenting hazards to health -I counseled her on the need to stay focused and treat diabetes intensively to  avoid  competitions.  6) Chronic Care/Health Maintenance:  -Patient is on ACEI/ARB and Statin medications and encouraged to continue to follow up with Ophthalmology, Podiatrist at least yearly or according to recommendations, and advised to  stay away from smoking. I have recommended yearly flu vaccine and pneumonia vaccination at least every 5 years; moderate intensity exercise for up to 150 minutes weekly; and  sleep for at least 7 hours a day.  - 25 minutes of time was spent on the care of this patient , 50% of which was applied for counseling on diabetes complications and their preventions.  - I advised patient to maintain close follow up with HAWKINS,EDWARD L, MD for primary care needs.  Patient is asked to bring meter and  blood glucose logs during their next visit.   Follow up plan: -Return in about 3 months (around 08/16/2015) for diabetes, high blood pressure, high cholesterol, follow up with pre-visit labs,  meter, and logs.  Glade Lloyd, MD Phone: [phone]  Fax: [phone]   05/18/2015, 9:57 AM

## 2015-05-18 NOTE — Telephone Encounter (Signed)
Clarification given on test strips prescription with no directions.

## 2015-05-18 NOTE — Telephone Encounter (Signed)
[facility] called about Sandy Valencia's prescription that was just sent in - please call them back at [phone] she didn't tell me what the questions was.Marland KitchenMarland Kitchen

## 2015-06-23 ENCOUNTER — Ambulatory Visit (INDEPENDENT_AMBULATORY_CARE_PROVIDER_SITE_OTHER): Payer: Medicaid Other | Admitting: Otolaryngology

## 2015-06-23 DIAGNOSIS — H95121 Granulation of postmastoidectomy cavity, right ear: Secondary | ICD-10-CM | POA: Diagnosis not present

## 2015-07-11 ENCOUNTER — Encounter: Payer: Self-pay | Admitting: Orthopedic Surgery

## 2015-07-27 ENCOUNTER — Ambulatory Visit (INDEPENDENT_AMBULATORY_CARE_PROVIDER_SITE_OTHER): Payer: Medicaid Other

## 2015-07-27 ENCOUNTER — Ambulatory Visit (INDEPENDENT_AMBULATORY_CARE_PROVIDER_SITE_OTHER): Payer: Medicaid Other | Admitting: Orthopedic Surgery

## 2015-07-27 ENCOUNTER — Encounter: Payer: Self-pay | Admitting: Orthopedic Surgery

## 2015-07-27 VITALS — BP 154/84 | Ht 65.0 in | Wt 239.0 lb

## 2015-07-27 DIAGNOSIS — M545 Low back pain: Secondary | ICD-10-CM

## 2015-07-27 DIAGNOSIS — M25561 Pain in right knee: Secondary | ICD-10-CM

## 2015-07-27 NOTE — Patient Instructions (Addendum)
Our plan is for her to go back to physical therapy for back exercises continue the hamstring exercises use BenGay or muscle cream on the hamstring  Follow-up as needed  Hamstring Strain With Rehab The hamstring muscle and tendons are vulnerable to muscle or tendon tear (strain). Hamstring tears cause pain and inflammation in the backside of the thigh, where the hamstring muscles are located. The hamstring is comprised of three muscles that are responsible for straightening the hip, bending the knee, and stabilizing the knee. These muscles are important for walking, running, and jumping. Hamstring strain is the most common injury of the thigh. Hamstring strains are classified as grade 1 or 2 strains. Grade 1 strains cause pain, but the tendon is not lengthened. Grade 2 strains include a lengthened ligament due to the ligament being stretched or partially ruptured. With grade 2 strains there is still function, although the function may be decreased.  SYMPTOMS   Pain, tenderness, swelling, warmth, or redness over the hamstring muscles, at the back of the thigh.  Pain that gets worse during and after intense activity.  A "pop" heard in the area, at the time of injury.  Muscle spasm in the hamstring muscles.  Pain or weakness with running, jumping, or bending the knee against resistance.  Crackling sound (crepitation) when the tendon is moved or touched.  Bruising (contusion) in the thigh within 48 hours of injury.  Loss of fullness of the muscle, or area of muscle bulging in the case of a complete rupture. CAUSES  A muscle strain occurs when a force is placed on the muscle or tendon that is greater than it can withstand. Common causes of injury include:  Strain from overuse or sudden increase in the frequency, duration, or intensity of activity.  Single violent blow or force to the back of the knee or the hamstring area of the thigh. RISK INCREASES WITH:  Sports that require quick  starts (sprinting, racquetball, tennis).  Sports that require jumping (basketball, volleyball).  Kicking sports and water skiing.  Contact sports (soccer, football).  Poor strength and flexibility.  Failure to warm up properly before activity.  Previous thigh, knee, or pelvis injury.  Poor exercise technique.  Poor posture. PREVENTION  Maintain physical fitness:  Strength, flexibility, and endurance.  Cardiovascular fitness.  Learn and use proper exercise technique and posture.  Wear proper fitted and padded protective equipment. PROGNOSIS  If treated properly, hamstring strains are usually curable in 2 to 6 weeks. RELATED COMPLICATIONS   Longer healing time, if not properly treated or if not given adequate time to heal.  Chronically inflamed tendon, causing persistent pain with activity that may progress to constant pain.  Recurring symptoms, if activity is resumed too soon.  Vulnerable to repeated injury (in up to 25% of cases). TREATMENT  Treatment first involves the use of ice and medication to help reduce pain and inflammation. It is also important to complete strengthening and stretching exercises, as well as modifying any activities that aggravate the symptoms. These exercises may be completed at home or with a therapist. Your caregiver may recommend the use of crutches to help reduce pain and discomfort, especially is the strain is severe enough to cause limping. If the tendon has pulled away from the bone, then surgery is necessary to reattach it. MEDICATION   If pain medicine is needed, nonsteroidal anti-inflammatory medicines (aspirin and ibuprofen), or other minor pain relievers (acetaminophen), are often advised.  Do not take pain medicine for 7 days before  surgery.  Prescription pain relievers may be given if your caregiver thinks they are needed. Use only as directed and only as much as you need.  Corticosteroid injections may be recommended. However,  these injections should only be used for serious cases, as they can only be given a certain number of times.  Ointments applied to the skin may be beneficial. HEAT AND COLD  Cold treatment (icing) relieves pain and reduces inflammation. Cold treatment should be applied for 10 to 15 minutes every 2 to 3 hours, and immediately after activity that aggravates your symptoms. Use ice packs or an ice massage.  Heat treatment may be used before performing the stretching and strengthening activities prescribed by your caregiver, physical therapist, or athletic trainer. Use a heat pack or a warm water soak. SEEK MEDICAL CARE IF:   Symptoms get worse or do not improve in 2 weeks, despite treatment.  New, unexplained symptoms develop. (Drugs used in treatment may produce side effects.)   EXERCISES Hold each for 2 sec repeat each 15 times Do each 1 a day  RANGE OF MOTION (ROM) AND STRETCHING EXERCISES - Hamstring Strain These exercises may help you when beginning to rehabilitate your injury. Your symptoms may go away with or without further involvement from your physician, physical therapist or athletic trainer. While completing these exercises, remember:   Restoring tissue flexibility helps normal motion to return to the joints. This allows healthier, less painful movement and activity.  An effective stretch should be held for at least 30 seconds.  A stretch should never be painful. You should only feel a gentle lengthening or release in the stretched tissue. STRETCH - Hamstrings, Standing  Stand or sit, and extend your right / left leg, placing your foot on a chair or foot stool.  Keep a slight arch in your low back and your hips straight forward.  Lead with your chest, and lean forward at the waist until you feel a gentle stretch in the back of your right / left knee or thigh. (When done correctly, this exercise requires leaning only a small distance.)  Hold this position for __________  seconds. Repeat __________ times. Complete this stretch __________ times per day. STRETCH - Hamstrings, Supine   Lie on your back. Loop a belt or towel over the ball of your right / left foot.  Straighten your right / left knee and slowly pull on the belt to raise your leg. Do not allow the right / left knee to bend. Keep your opposite leg flat on the floor.  Raise the leg until you feel a gentle stretch behind your right / left knee or thigh. Hold this position for __________ seconds. Repeat __________ times. Complete this stretch __________ times per day.  STRETCH - Hamstrings, Doorway  Lie on your back with your right / left leg extended and resting on the wall, and the opposite leg flat on the ground through the door. Initially, position your bottom farther away from the wall.  Keep your right / left knee straight. If you feel a stretch behind your knee or thigh, hold this position for __________ seconds.  If you do not feel a stretch, scoot your bottom closer to the door and hold __________ seconds. Repeat __________ times. Complete this stretch __________ times per day.  STRETCH - Hamstrings/Adductors, V-Sit   Sit on the floor with your legs extended in a large "V," keeping your knees straight.  With your head and chest upright, bend at your waist reaching  for your left foot to stretch your right thigh muscles.  You should feel a stretch in your right inner thigh. Hold for __________ seconds.  Return to the upright position to relax your leg muscles.  Continuing to keep your chest upright, bend straight forward at your waist to stretch your hamstrings.  You should feel a stretch behind both of your thighs and knees. Hold for __________ seconds.  Return to the upright position to relax your leg muscles.  With your head and chest upright, bend at your waist reaching for your right foot to stretch your left thigh muscles.  You should feel a stretch in your left inner thigh.  Hold for __________ seconds.  Return to the upright position to relax your leg muscles. Repeat __________ times. Complete this exercise __________ times per day.  STRENGTHENING EXERCISES - Hamstring Strain These exercises may help you when beginning to rehabilitate your injury. They may resolve your symptoms with or without further involvement from your physician, physical therapist or athletic trainer. While completing these exercises, remember:   Muscles can gain both the endurance and the strength needed for everyday activities through controlled exercises.  Complete these exercises as instructed by your physician, physical therapist or athletic trainer. Increase the resistance and repetitions only as guided.  You may experience muscle soreness or fatigue, but the pain or discomfort you are trying to eliminate should never get worse during these exercises. If this pain does get worse, stop and make certain you are following the directions exactly. If the pain is still present after adjustments, discontinue the exercise until you can discuss the trouble with your clinician. STRENGTH - Hip Extensors, Straight Leg Raises   Lie on your stomach on a firm surface.  Tense the muscles in your buttocks to lift your right / left leg about 4 inches. If you cannot lift your leg this high without arching your back, place a pillow under your hips.  Keep your knee straight. Hold for __________ seconds.  Slowly lower your leg to the starting position and allow it to relax completely before starting the next repetition. Repeat __________ times. Complete this exercise __________ times per day.  STRENGTH - Hamstring, Isometrics   Lie on your back on a firm surface.  Bend your right / left knee approximately __________ degrees.  Dig your heel into the surface, as if you are trying to pull it toward your buttocks. Tighten the muscles in the back of your thighs to "dig" as hard as you can, without  increasing any pain.  Hold this position for __________ seconds.  Release the tension gradually and allow your muscles to completely relax for __________ seconds between each exercise. Repeat __________ times. Complete this exercise __________ times per day.  STRENGTH - Hamstring, Curls   Lay on your stomach with your legs extended. (If you lay on a bed, your feet may hang over the edge.)  Tighten the muscles in the back of your thigh to bend your right / left knee up to 90 degrees. Keep your hips flat on the bed or floor.  Hold this position for __________ seconds.  Slowly lower your leg back to the starting position. Repeat __________ times. Complete this exercise __________ times per day.  OPTIONAL ANKLE WEIGHTS: Begin with ____________________, but DO NOT exceed ____________________. Increase in 1 pound/0.5 kilogram increments.   This information is not intended to replace advice given to you by your health care provider. Make sure you discuss any questions you have  with your health care provider.   Document Released: 05/21/2005 Document Revised: 06/11/2014 Document Reviewed: 09/02/2008 Elsevier Interactive Patient Education [employer].

## 2015-07-27 NOTE — Progress Notes (Signed)
Chief Complaint  Patient presents with  . Leg Problem    Rt  hamstring pain    History the patient was injured in May 2016 when she was on an escalator it stopped and caused her to injure her right hamstring she presents with continued pain in the back of her leg. She was sent for physical therapy they*1 visit because she is on Medicaid she still having aching posterior thigh pain 5 out of 10 unrelieved by exercises. She doesn't list any medicines that she tried but still has pain no weakness. She does note pain in the back of her leg after sitting if she's been driving for any period of time  She has a history of lower back pain without radicular symptoms  Bowel bladder function intact  No fever chills or weight loss recently  Respiratory GI GU symptoms negative  Past Medical History  Diagnosis Date  . Diabetes mellitus   . GERD (gastroesophageal reflux disease)   . Arthritis   . Hypertension   . Fatty liver    Ortho Exam   BP 154/84 mmHg  Ht 5\' 5"  (1.651 m)  Wt 239 lb (108.41 kg)  BMI 39.77 kg/m2 Patient's development was normal nutritional status good body habitus endomorphic no gross deformities attention to grooming is normal  Good pulse distally no peripheral edema she has some varicose veins in the back of her legs  Groin area in each leg is normal in terms of lymph node enlargement  Gait and station are normal  Lumbar spine nontender  No palpable defects in either hamstring and both have normal strength although there is tenderness in the right hamstring. Knee stable. Muscle tone normal. Strength normal. Skin is intact neurovascular exam is normal she is oriented 3 mood and affect are also normal  Deep tendon reflexes are normal and straight leg raises are negative  X-rays were taken degenerative scoliosis primarily L3-4 L4-5 with joint space narrowing osteophyte formation.  We also were able to see her MRI from 2004 she had some mild degenerative disc disease  back at that time   Our plan is for her to go back to physical therapy for back exercises continue the hamstring exercises use BenGay or muscle cream on the hamstring  Follow-up as needed

## 2015-07-29 IMAGING — MG MM SCREENING BREAST TOMO BILATERAL
6 of 9 series · 6 of 25 positions shown · non-contrast
Comparison: Previous exam(s).

CLINICAL DATA: Screening.

EXAM:
DIGITAL SCREENING BILATERAL MAMMOGRAM WITH 3D TOMO WITH CAD

[L MLO (1 of 2)]
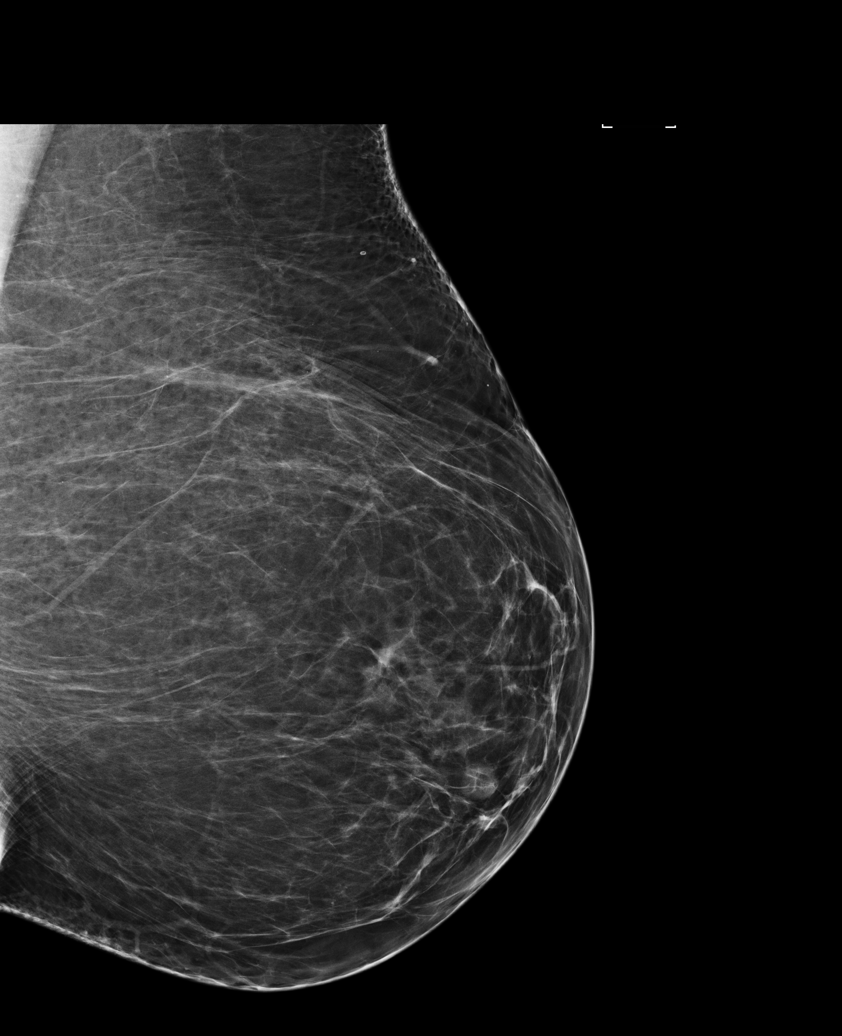

[R MLO]
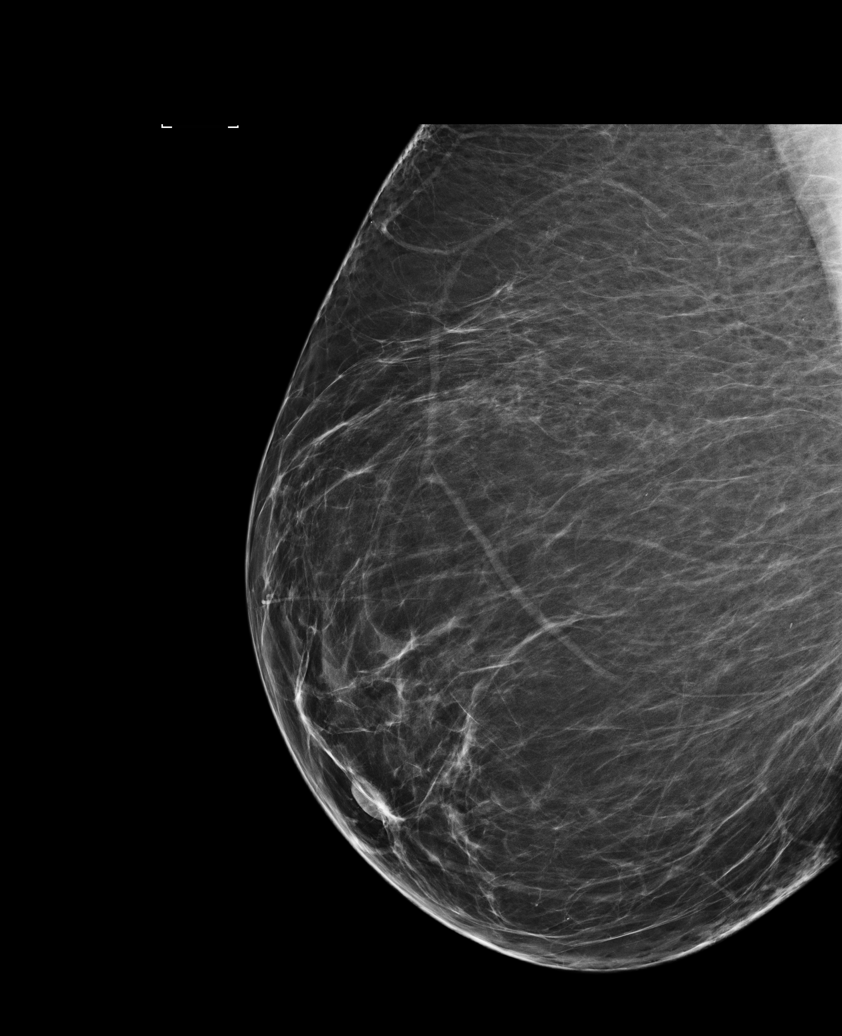

[R CC]
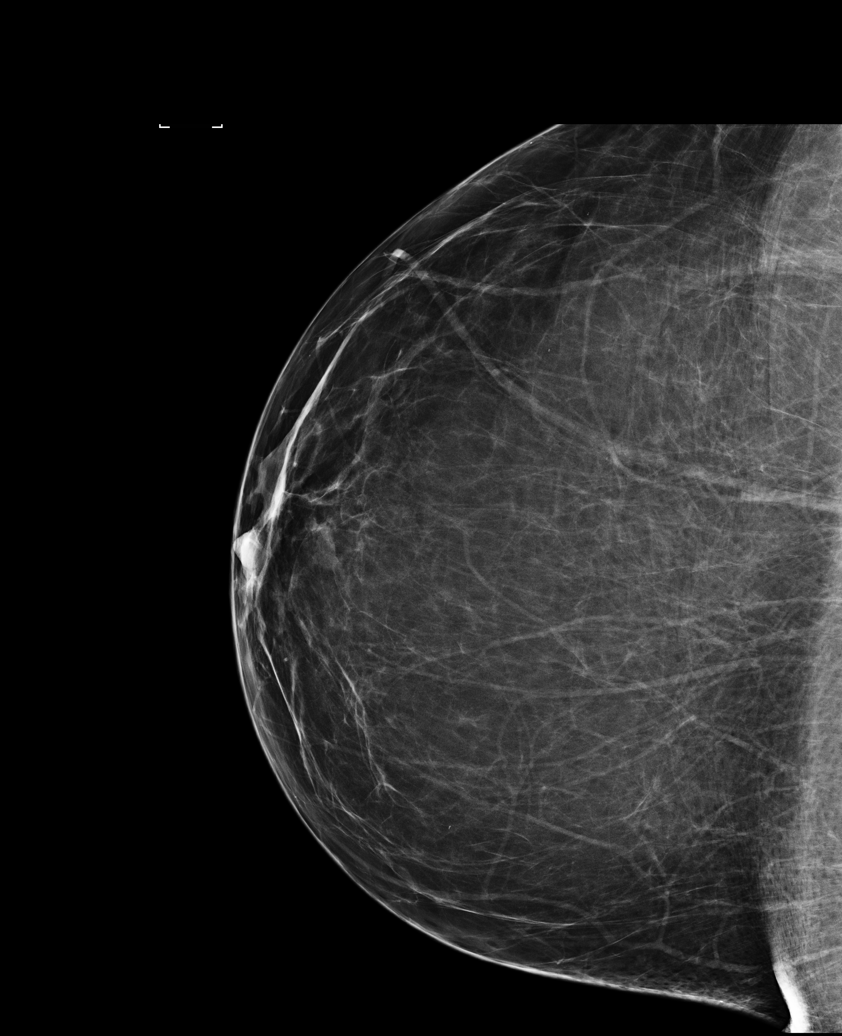

[L CC]
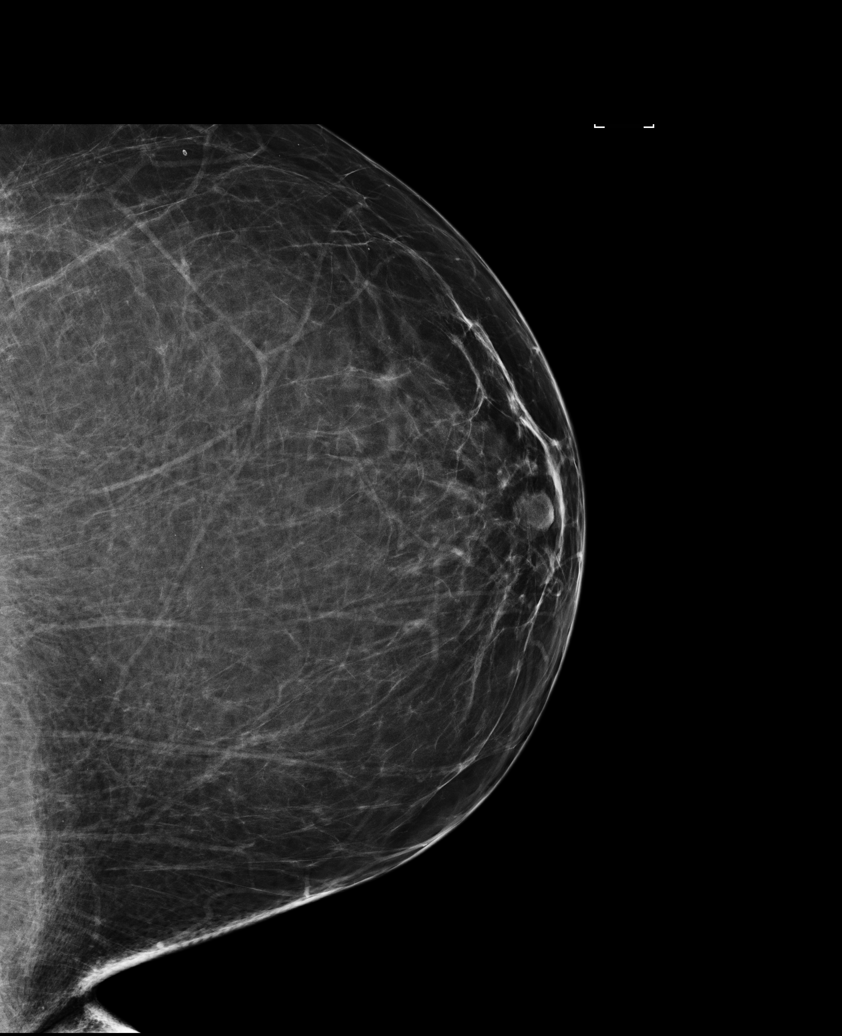

[L MLO (2 of 2)]
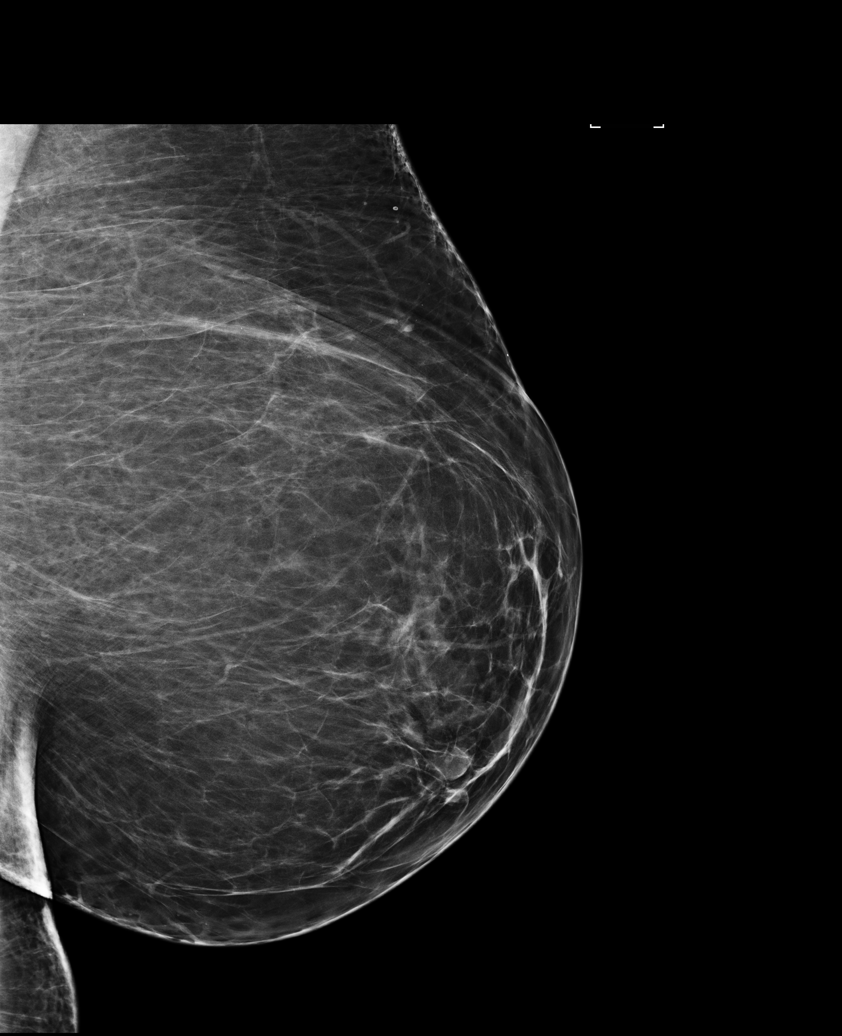

[L CC tomo · tomo slice 50/99.0]
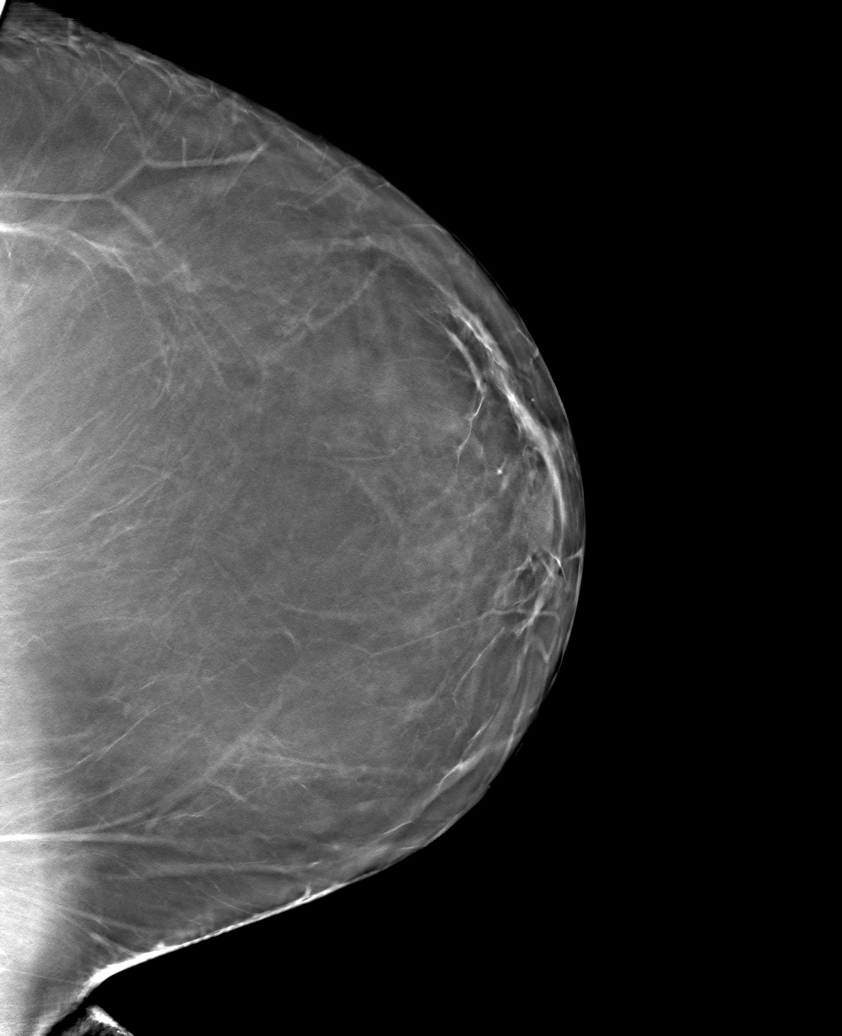

[6 of 25 positions shown; findings below may reference images not displayed]

ACR Breast Density Category b: There are scattered areas of
fibroglandular density.
FINDINGS: There are no findings suspicious for malignancy. Images were
processed with CAD.
IMPRESSION: No mammographic evidence of malignancy. A result letter of this
screening mammogram will be mailed directly to the patient.

RECOMMENDATION:
Screening mammogram in one year. (Code:55-L-23V)

BI-RADS CATEGORY  1: Negative.

## 2015-08-05 ENCOUNTER — Ambulatory Visit (HOSPITAL_COMMUNITY): Payer: Medicaid Other | Admitting: Physical Therapy

## 2015-08-11 ENCOUNTER — Other Ambulatory Visit: Payer: Self-pay | Admitting: "Endocrinology

## 2015-08-11 LAB — BASIC METABOLIC PANEL
BUN: 20 mg/dL (ref 7–25)
CALCIUM: 9.4 mg/dL (ref 8.6–10.4)
CHLORIDE: 103 mmol/L (ref 98–110)
CO2: 25 mmol/L (ref 20–31)
CREATININE: 0.72 mg/dL (ref 0.50–1.05)
Glucose, Bld: 121 mg/dL — ABNORMAL HIGH (ref 65–99)
Potassium: 4.3 mmol/L (ref 3.5–5.3)
Sodium: 140 mmol/L (ref 135–146)

## 2015-08-12 LAB — HEMOGLOBIN A1C
Hgb A1c MFr Bld: 8 % — ABNORMAL HIGH (ref <5.7)
MEAN PLASMA GLUCOSE: 183 mg/dL — AB (ref <117)

## 2015-08-15 ENCOUNTER — Telehealth: Payer: Self-pay | Admitting: "Endocrinology

## 2015-08-15 ENCOUNTER — Other Ambulatory Visit: Payer: Self-pay | Admitting: "Endocrinology

## 2015-08-15 NOTE — Telephone Encounter (Signed)
she called to reschedule, I called back but no answer

## 2015-08-16 ENCOUNTER — Ambulatory Visit (HOSPITAL_COMMUNITY): Payer: Medicaid Other | Attending: Orthopedic Surgery | Admitting: Physical Therapy

## 2015-08-16 ENCOUNTER — Encounter (HOSPITAL_COMMUNITY): Payer: Self-pay | Admitting: Physical Therapy

## 2015-08-16 DIAGNOSIS — M6281 Muscle weakness (generalized): Secondary | ICD-10-CM | POA: Insufficient documentation

## 2015-08-16 DIAGNOSIS — M79604 Pain in right leg: Secondary | ICD-10-CM

## 2015-08-16 DIAGNOSIS — R531 Weakness: Secondary | ICD-10-CM

## 2015-08-16 NOTE — Therapy (Signed)
[place], [place], [address] Phone: [phone]   Fax:  [phone]  Physical Therapy Evaluation/Discharge  Patient Details  Name: Sandy Valencia MRN: [id number] Date of Birth: 20-Aug-1960 Referring Provider: Carole Civil, MD  Encounter Date: 08/16/2015      PT End of Session - 08/16/15 1611    Visit Number 1   Number of Visits 1   Authorization Type medicaid   Authorization - Visit Number 1   Authorization - Number of Visits 1   PT Start Time [id number]   PT Stop Time 1603   PT Time Calculation (min) 45 min   Activity Tolerance Patient tolerated treatment well   Behavior During Therapy [facility] for tasks assessed/performed      Past Medical History  Diagnosis Date  . Diabetes mellitus   . GERD (gastroesophageal reflux disease)   . Arthritis   . Hypertension   . Fatty liver     Past Surgical History  Procedure Laterality Date  . Abdominal hysterectomy    . Cholecystectomy    . Middle ear surgery    . Colonoscopy N/A 09/22/2012    RMR: colonic polyps -removed as described above. tubular adenoma, next TCS 09/2017  . Esophagogastroduodenoscopy N/A 10/21/2014    RMR: Small hiatal hernia; otherwise normal EGD status post passage of a Maloney dilator.   [name] dilation N/A 10/21/2014    Procedure: [name] DILATION;  Surgeon: Daneil Dolin, MD;  Location: [facility];  Service: Endoscopy;  Laterality: N/A;    There were no vitals filed for this visit.  Visit Diagnosis:  Right leg pain - Plan: PT plan of care cert/re-cert  Decreased strength - Plan: PT plan of care cert/re-cert      Subjective Assessment - 08/16/15 1528    Subjective Was on an escalator (10/28/2014) and it stopped suddenly. Had pain in the back of the leg following this and was told she had pulled a hamstring muscle. This did not improve and she went for further evaluation and it was reported that it could be possibly coming from her  back.    How long can you sit comfortably? unlimited   How long can you stand comfortably? unlimited   How long can you walk comfortably? unlimited   Patient Stated Goals get rid of pain in the back of the leg.    Currently in Pain? No/denies  Not while sitting but will increase when standing.            [facility] PT Assessment - 08/16/15 0001    Assessment   Medical Diagnosis arthralgia of right leg   Referring Provider Carole Civil, MD   Onset Date/Surgical Date 10/28/14   Next MD Visit not scheduled   Precautions   Precautions None   Balance Screen   Has the patient fallen in the past 6 months Yes   How many times? 1   Has the patient had a decrease in activity level because of a fear of falling?  No   Is the patient reluctant to leave their home because of a fear of falling?  No   Prior Function   Level of Independence Independent   Cognition   Overall Cognitive Status Within Functional Limits for tasks assessed   Observation/Other Assessments   Observations no significant deviations   Sensation   Light Touch Appears Intact   AROM   Overall AROM Comments Lumbar AROM: WFL through flexion, extension, rotation  bilaterally, and sidebending bilaterally. No pain reproduction   Strength   Right Knee Flexion 4-/5   Right Knee Extension 4/5   Left Knee Flexion 4+/5   Left Knee Extension 5/5   Flexibility   Hamstrings moderate restriction with pain reproduction   Piriformis WFL   Palpation   Palpation comment pain reproduction with palpation along lateral hamstring musculature   Special Tests    Special Tests Lumbar   Lumbar Tests Slump Test;Straight Leg Raise   Slump test   Findings Negative   Straight Leg Raise   Findings Negative   Ambulation/Gait   Gait Comments independent, no deviations noted                           PT Education - 08/16/15 1610    Education provided Yes   Education Details HEP and progression, stopping any exercise  that increases her pain   Person(s) Educated Patient   Methods Explanation;Demonstration;Tactile cues;Handout;Verbal cues   Comprehension Verbalized understanding;Returned demonstration          PT Short Term Goals - 12/13/14 1641    PT SHORT TERM GOAL #1   Title Patient will be able to sit for at least 1 hour with pain no more than 2/10 R LE    Time 2   Period Weeks   Status New   PT SHORT TERM GOAL #2   Title Patient will demonstrate bilateral hip IR range of motion of 45 degrees    Time 2   Period Weeks   Status New   PT SHORT TERM GOAL #3   Title Patient will be independent in correctly and consistently performing appropriate HEP, to be updated PRN    Time 2   Period Weeks   Status New           PT Long Term Goals - 08/16/15 1616    PT LONG TERM GOAL #1   Title Patient to be independent with a progressive HEP for stretching and strengthening for improved activity tolerance with household chores.   Time 1   Period Days   Status Achieved               Plan - 08/16/15 1612    Clinical Impression Statement Patient seen for initial evaluation and treatment. During the session the patient's pain was reproduced with hamstring stretches, resisted knee flexion and with palpation along the hamstring musculature. No neurological symptoms were elicited during the session with trunk ROM or with special tests. The focus of the session was on establishment of a HEP for stretching and strengthening progression. The patient was educated on this at length and she denied any questions or concerns. Due to medicaid restrictions, no further PT sessions were scheduled.    Pt will benefit from skilled therapeutic intervention in order to improve on the following deficits Abnormal gait;Decreased activity tolerance;Decreased range of motion;Difficulty walking;Decreased strength   Rehab Potential Good   PT Treatment/Interventions Therapeutic exercise   PT Next Visit Plan single session  due to medicaid restrictions.    PT Home Exercise Plan HEP provided   Consulted and Agree with Plan of Care Patient         Problem List Patient Active Problem List   Diagnosis Date Noted  . Uncontrolled type 2 diabetes mellitus with complication, with long-term current use of insulin ([place]) 05/18/2015  . Hyperlipidemia 05/18/2015  . Essential hypertension, benign 05/18/2015  . Morbid obesity ([place]) 05/18/2015  .  Personal history of noncompliance with medical treatment, presenting hazards to health 05/18/2015  . GERD (gastroesophageal reflux disease) 02/14/2015  . Anemia, iron deficiency 02/14/2015  . Fatty liver 02/14/2015  . Hiatal hernia   . Dysphagia, pharyngoesophageal phase   . Dysphagia 10/11/2014  . Dyspepsia 10/11/2014  . Family history of colon cancer 09/03/2012    Cassell Clement, PT, CSCS Pager [phone]  08/16/2015, 4:24 PM  [place], [place], [address] Phone: [phone]   Fax:  [phone]  Name: IVONE PAPAGEORGIOU MRN: [id number] Date of Birth: 1960/12/16

## 2015-08-19 ENCOUNTER — Ambulatory Visit: Payer: Medicaid Other | Admitting: "Endocrinology

## 2015-08-24 ENCOUNTER — Encounter: Payer: Self-pay | Admitting: "Endocrinology

## 2015-08-24 ENCOUNTER — Ambulatory Visit (INDEPENDENT_AMBULATORY_CARE_PROVIDER_SITE_OTHER): Payer: Medicaid Other | Admitting: "Endocrinology

## 2015-08-24 VITALS — BP 137/82 | HR 86 | Ht 65.0 in | Wt 242.0 lb

## 2015-08-24 DIAGNOSIS — E118 Type 2 diabetes mellitus with unspecified complications: Secondary | ICD-10-CM | POA: Diagnosis not present

## 2015-08-24 DIAGNOSIS — IMO0002 Reserved for concepts with insufficient information to code with codable children: Secondary | ICD-10-CM

## 2015-08-24 DIAGNOSIS — E785 Hyperlipidemia, unspecified: Secondary | ICD-10-CM | POA: Diagnosis not present

## 2015-08-24 DIAGNOSIS — E1165 Type 2 diabetes mellitus with hyperglycemia: Secondary | ICD-10-CM

## 2015-08-24 DIAGNOSIS — Z794 Long term (current) use of insulin: Secondary | ICD-10-CM

## 2015-08-24 DIAGNOSIS — I10 Essential (primary) hypertension: Secondary | ICD-10-CM | POA: Diagnosis not present

## 2015-08-24 NOTE — Patient Instructions (Signed)

## 2015-08-24 NOTE — Progress Notes (Signed)
Subjective:    Patient ID: Sandy Valencia, female    DOB: 10/02/1960, PCP Alonza Bogus, MD   Past Medical History  Diagnosis Date  . Diabetes mellitus   . GERD (gastroesophageal reflux disease)   . Arthritis   . Hypertension   . Fatty liver    Past Surgical History  Procedure Laterality Date  . Abdominal hysterectomy    . Cholecystectomy    . Middle ear surgery    . Colonoscopy N/A 09/22/2012    RMR: colonic polyps -removed as described above. tubular adenoma, next TCS 09/2017  . Esophagogastroduodenoscopy N/A 10/21/2014    RMR: Small hiatal hernia; otherwise normal EGD status post passage of a Maloney dilator.   [name] dilation N/A 10/21/2014    Procedure: [name] DILATION;  Surgeon: Daneil Dolin, MD;  Location: [facility];  Service: Endoscopy;  Laterality: N/A;   Social History   Social History  . Marital Status: Divorced    Spouse Name: N/A  . Number of Children: N/A  . Years of Education: N/A   Social History Main Topics  . Smoking status: Never Smoker   . Smokeless tobacco: Never Used  . Alcohol Use: No  . Drug Use: No  . Sexual Activity: Yes    Birth Control/ Protection: Surgical   Other Topics Concern  . None   Social History Narrative   Outpatient Encounter Prescriptions as of 08/24/2015  Medication Sig  . aspirin EC 81 MG tablet Take 81 mg by mouth daily.  . beclomethasone (QVAR) 80 MCG/ACT inhaler Inhale 2 puffs into the lungs 2 (two) times daily.  . Cholecalciferol (VITAMIN D-3) 1000 UNITS CAPS Take 1 capsule by mouth daily.  Marland Kitchen HYDROcodone-acetaminophen (NORCO) 7.5-325 MG per tablet Take 1 tablet by mouth every 4 (four) hours as needed for pain.  . Insulin Aspart (NOVOLOG FLEXPEN [place]) Inject 10-16 Units into the skin 3 (three) times daily with meals.  . Insulin Glargine (LANTUS SOLOSTAR) 100 UNIT/ML Solostar Pen Inject 60 Units into the skin daily at 10 pm.  . INVOKANA 100 MG TABS tablet TAKE ONE (1) TABLET EACH DAY  .  lisinopril-hydrochlorothiazide (PRINZIDE,ZESTORETIC) 20-25 MG tablet Take 1 tablet by mouth daily.  . pantoprazole (PROTONIX) 40 MG tablet TAKE ONE (1) TABLET BY MOUTH EVERY DAY  . simvastatin (ZOCOR) 20 MG tablet TAKE ONE TABLET DAILY AT BEDTIME  . vitamin B-12 (CYANOCOBALAMIN) 100 MCG tablet Take 100 mcg by mouth daily.  . [DISCONTINUED] insulin aspart (NOVOLOG FLEXPEN) 100 UNIT/ML FlexPen Inject 15-21 Units into the skin 3 (three) times daily with meals.  Marland Kitchen glucose blood (ACCU-CHEK COMPACT PLUS) test strip Use as instructed  . Insulin Pen Needle (B-D ULTRAFINE III SHORT PEN) 31G X 8 MM MISC 1 each by Does not apply route as directed.  . Lancets MISC 1 each by Does not apply route as directed.   No facility-administered encounter medications on file as of 08/24/2015.   ALLERGIES: No Known Allergies VACCINATION STATUS: There is no immunization history for the selected administration types on file for this patient.  Diabetes She presents for her follow-up diabetic visit. She has type 2 diabetes mellitus. Her disease course has been improving (She was diagnosed at approximate age of 31 years.). There are no hypoglycemic associated symptoms. Pertinent negatives for hypoglycemia include no confusion, headaches, pallor or seizures. There are no diabetic associated symptoms. Pertinent negatives for diabetes include no chest pain, no polydipsia, no polyphagia and no polyuria. There are no hypoglycemic complications.  Symptoms are improving. There are no diabetic complications. (She has history of noncompliance.) Risk factors for coronary artery disease include dyslipidemia, diabetes mellitus, hypertension, sedentary lifestyle and tobacco exposure. Current diabetic treatment includes insulin injections (She only administered 50% of prescribed insulin.). She is compliant with treatment some of the time. Her weight is stable. She is following a generally unhealthy diet. She never participates in exercise. Her  home blood glucose trend is decreasing rapidly. Her breakfast blood glucose range is generally 140-180 mg/dl. Her lunch blood glucose range is generally 140-180 mg/dl. Her dinner blood glucose range is generally 140-180 mg/dl. Her overall blood glucose range is 140-180 mg/dl. An ACE inhibitor/angiotensin II receptor blocker is being taken.  Hyperlipidemia This is a chronic problem. The current episode started more than 1 year ago. The problem is uncontrolled. Pertinent negatives include no chest pain, myalgias or shortness of breath. Current antihyperlipidemic treatment includes statins. Risk factors for coronary artery disease include diabetes mellitus, dyslipidemia, hypertension, obesity and a sedentary lifestyle.  Hypertension This is a chronic problem. The current episode started more than 1 year ago. The problem is uncontrolled. Pertinent negatives include no chest pain, headaches, palpitations or shortness of breath. Risk factors for coronary artery disease include dyslipidemia, diabetes mellitus, obesity, sedentary lifestyle and smoking/tobacco exposure. Past treatments include nothing.     Review of Systems  Constitutional: Negative for unexpected weight change.  HENT: Negative for trouble swallowing and voice change.   Eyes: Negative for visual disturbance.  Respiratory: Negative for cough, shortness of breath and wheezing.   Cardiovascular: Negative for chest pain, palpitations and leg swelling.  Gastrointestinal: Negative for nausea, vomiting and diarrhea.  Endocrine: Negative for cold intolerance, heat intolerance, polydipsia, polyphagia and polyuria.  Musculoskeletal: Negative for myalgias and arthralgias.  Skin: Negative for color change, pallor, rash and wound.  Neurological: Negative for seizures and headaches.  Psychiatric/Behavioral: Negative for suicidal ideas and confusion.    Objective:    BP 137/82 mmHg  Pulse 86  Ht 5\' 5"  (1.651 m)  Wt 242 lb (109.77 kg)  BMI 40.27  kg/m2  SpO2 98%  Wt Readings from Last 3 Encounters:  08/24/15 242 lb (109.77 kg)  07/27/15 239 lb (108.41 kg)  05/18/15 241 lb (109.317 kg)    Physical Exam  Constitutional: She is oriented to person, place, and time. She appears well-developed.  HENT:  Head: Normocephalic and atraumatic.  Eyes: EOM are normal.  Neck: Normal range of motion. Neck supple. No tracheal deviation present. No thyromegaly present.  Cardiovascular: Normal rate and regular rhythm.   Pulmonary/Chest: Effort normal and breath sounds normal.  Abdominal: Soft. Bowel sounds are normal. There is no tenderness. There is no guarding.  Musculoskeletal: Normal range of motion. She exhibits no edema.  Neurological: She is alert and oriented to person, place, and time. She has normal reflexes. No cranial nerve deficit. Coordination normal.  Skin: Skin is warm and dry. No rash noted. No erythema. No pallor.  Psychiatric: She has a normal mood and affect. Judgment normal.     Complete Blood Count (Most recent): Lab Results  Component Value Date   WBC 4.7 03/23/2015   HGB 14.1 03/23/2015   HCT 43.1 03/23/2015   MCV 84.8 03/23/2015   PLT 250 03/23/2015   Chemistry (most recent): Lab Results  Component Value Date   NA 140 08/11/2015   K 4.3 08/11/2015   CL 103 08/11/2015   CO2 25 08/11/2015   BUN 20 08/11/2015   CREATININE 0.72 08/11/2015  Assessment & Plan:   1. Uncontrolled type 2 diabetes mellitus with complication, with long-term current use of insulin ([place])   -her  diabetes is  complicated by noncompliance and patient remains at a high risk for more acute and chronic complications of diabetes which include CAD, CVA, CKD, retinopathy, and neuropathy. These are all discussed in detail with the patient.  Patient came with above target glucose profile, and  recent A1c of 8% improving from 8.7%. - Her average blood glucose is 175.Marland Kitchen Recent labs reviewed.   - I have re-counseled the patient on diet  management and weight loss  by adopting a carbohydrate restricted / protein rich  Diet.  - Suggestion is made for patient to avoid simple carbohydrates   from their diet including Cakes , Desserts, Ice Cream,  Soda (  diet and regular) , Sweet Tea , Candies,  Chips, Cookies, Artificial Sweeteners,   and "Sugar-free" Products .  This will help patient to have stable blood glucose profile and potentially avoid unintended  Weight gain.  - Patient is advised to stick to a routine mealtimes to eat 3 meals  a day and avoid unnecessary snacks ( to snack only to correct hypoglycemia).  - The patient  has been  scheduled with Jearld Fenton, RDN, CDE for individualized DM education.  - I have approached patient with the following individualized plan to manage diabetes and patient agrees.  - She is advised to continue Lantus 60 qhs, lower Novolog to 10 units TIDAC plus SSI. Pt to continue to monitor BG ac and hs.  - She is advised to keep her appointment with Jearld Fenton, CDE for individualized DM education. I will continue Simvastatin 20mg  po qhs. -Continue Invokana 137mmg po qam. SE discussed again to her.  - Patient will be considered for incretin therapy as appropriate next visit. - Patient specific target  for A1c; LDL, HDL, Triglycerides, and  Waist Circumference were discussed in detail.  2) BP/HTN: Uncontrolled.   I have initiated hydrochlorothiazide/lisinopril 25/20 mg by mouth daily. I counseled her on salt restriction.   3) Lipids/HPL:  continue statins. 4)  Weight/Diet: CDE consult in progress, exercise, and carbohydrates information provided. 5. Personal history of noncompliance with medical treatment, presenting hazards to health -I counseled her on the need to stay focused and treat diabetes intensively to  avoid competitions.  6) Chronic Care/Health Maintenance:  -Patient is on ACEI/ARB and Statin medications and encouraged to continue to follow up with Ophthalmology, Podiatrist  at least yearly or according to recommendations, and advised to  stay away from smoking. I have recommended yearly flu vaccine and pneumonia vaccination at least every 5 years; moderate intensity exercise for up to 150 minutes weekly; and  sleep for at least 7 hours a day.  - 25 minutes of time was spent on the care of this patient , 50% of which was applied for counseling on diabetes complications and their preventions.  - I advised patient to maintain close follow up with HAWKINS,EDWARD L, MD for primary care needs.  Patient is asked to bring meter and  blood glucose logs during their next visit.   Follow up plan: -Return in about 3 months (around 11/24/2015) for diabetes, high blood pressure, high cholesterol, follow up with pre-visit labs, meter, and logs.  Glade Lloyd, MD Phone: [phone]  Fax: [phone]   08/24/2015, 3:30 PM

## 2015-09-07 ENCOUNTER — Encounter (HOSPITAL_COMMUNITY): Payer: Self-pay

## 2015-09-15 ENCOUNTER — Ambulatory Visit (INDEPENDENT_AMBULATORY_CARE_PROVIDER_SITE_OTHER): Payer: Medicaid Other | Admitting: Otolaryngology

## 2015-10-11 ENCOUNTER — Other Ambulatory Visit: Payer: Self-pay | Admitting: Gastroenterology

## 2015-10-27 ENCOUNTER — Ambulatory Visit (INDEPENDENT_AMBULATORY_CARE_PROVIDER_SITE_OTHER): Payer: Medicaid Other | Admitting: Otolaryngology

## 2015-10-27 DIAGNOSIS — H95121 Granulation of postmastoidectomy cavity, right ear: Secondary | ICD-10-CM | POA: Diagnosis not present

## 2015-10-29 ENCOUNTER — Encounter (HOSPITAL_COMMUNITY): Payer: Self-pay

## 2015-10-29 ENCOUNTER — Emergency Department (HOSPITAL_COMMUNITY)
Admission: EM | Admit: 2015-10-29 | Discharge: 2015-10-29 | Disposition: A | Discharge status: Home / Self Care | Payer: Medicaid Other | Attending: Emergency Medicine | Admitting: Emergency Medicine

## 2015-10-29 DIAGNOSIS — Z794 Long term (current) use of insulin: Secondary | ICD-10-CM | POA: Insufficient documentation

## 2015-10-29 DIAGNOSIS — E119 Type 2 diabetes mellitus without complications: Secondary | ICD-10-CM | POA: Diagnosis not present

## 2015-10-29 DIAGNOSIS — M1990 Unspecified osteoarthritis, unspecified site: Secondary | ICD-10-CM | POA: Insufficient documentation

## 2015-10-29 DIAGNOSIS — Z7982 Long term (current) use of aspirin: Secondary | ICD-10-CM | POA: Insufficient documentation

## 2015-10-29 DIAGNOSIS — T781XXA Other adverse food reactions, not elsewhere classified, initial encounter: Secondary | ICD-10-CM | POA: Insufficient documentation

## 2015-10-29 DIAGNOSIS — R0602 Shortness of breath: Secondary | ICD-10-CM | POA: Diagnosis not present

## 2015-10-29 DIAGNOSIS — Z79891 Long term (current) use of opiate analgesic: Secondary | ICD-10-CM | POA: Insufficient documentation

## 2015-10-29 DIAGNOSIS — Z79899 Other long term (current) drug therapy: Secondary | ICD-10-CM | POA: Diagnosis not present

## 2015-10-29 DIAGNOSIS — T7840XA Allergy, unspecified, initial encounter: Secondary | ICD-10-CM

## 2015-10-29 LAB — CBC WITH DIFFERENTIAL/PLATELET
Basophils Absolute: 0 10*3/uL (ref 0.0–0.1)
Basophils Relative: 0 %
Eosinophils Absolute: 0.1 10*3/uL (ref 0.0–0.7)
Eosinophils Relative: 1 %
HCT: 47.8 % — ABNORMAL HIGH (ref 36.0–46.0)
HEMOGLOBIN: 15.4 g/dL — AB (ref 12.0–15.0)
LYMPHS ABS: 1.7 10*3/uL (ref 0.7–4.0)
LYMPHS PCT: 25 %
MCH: 28.3 pg (ref 26.0–34.0)
MCHC: 32.2 g/dL (ref 30.0–36.0)
MCV: 87.9 fL (ref 78.0–100.0)
MONOS PCT: 7 %
Monocytes Absolute: 0.5 10*3/uL (ref 0.1–1.0)
NEUTROS PCT: 67 %
Neutro Abs: 4.6 10*3/uL (ref 1.7–7.7)
Platelets: 269 10*3/uL (ref 150–400)
RBC: 5.44 MIL/uL — AB (ref 3.87–5.11)
RDW: 14.7 % (ref 11.5–15.5)
WBC: 6.9 10*3/uL (ref 4.0–10.5)

## 2015-10-29 LAB — BASIC METABOLIC PANEL
Anion gap: 10 (ref 5–15)
BUN: 19 mg/dL (ref 6–20)
CHLORIDE: 104 mmol/L (ref 101–111)
CO2: 25 mmol/L (ref 22–32)
CREATININE: 0.82 mg/dL (ref 0.44–1.00)
Calcium: 9.7 mg/dL (ref 8.9–10.3)
GFR calc non Af Amer: >60 mL/min (ref >60)
Glucose, Bld: 218 mg/dL — ABNORMAL HIGH (ref 65–99)
POTASSIUM: 3.7 mmol/L (ref 3.5–5.1)
SODIUM: 139 mmol/L (ref 135–145)

## 2015-10-29 MED ORDER — PREDNISONE 10 MG PO TABS: 40.0000 mg | ORAL_TABLET | Freq: Every day | ORAL | Status: DC

## 2015-10-29 MED ORDER — FAMOTIDINE 20 MG PO TABS: 20.0000 mg | ORAL_TABLET | Freq: Two times a day (BID) | ORAL | Status: DC

## 2015-10-29 MED ORDER — DIPHENHYDRAMINE HCL 25 MG PO TABS: 25.0000 mg | ORAL_TABLET | Freq: Four times a day (QID) | ORAL | Status: DC

## 2015-10-29 MED ORDER — FAMOTIDINE IN NACL 20-0.9 MG/50ML-% IV SOLN
20.0000 mg | Freq: Once | INTRAVENOUS | Status: AC
  Administered 2015-10-29: 20 mg via INTRAVENOUS
  Filled 2015-10-29: qty 50

## 2015-10-29 MED ORDER — SODIUM CHLORIDE 0.9 % IV BOLUS (SEPSIS)
500.0000 mL | Freq: Once | INTRAVENOUS | Status: AC
  Administered 2015-10-29: 500 mL via INTRAVENOUS

## 2015-10-29 MED ORDER — DIPHENHYDRAMINE HCL 50 MG/ML IJ SOLN
25.0000 mg | Freq: Once | INTRAMUSCULAR | Status: AC
  Administered 2015-10-29: 25 mg via INTRAVENOUS
  Filled 2015-10-29: qty 1

## 2015-10-29 MED ORDER — METHYLPREDNISOLONE SODIUM SUCC 125 MG IJ SOLR
125.0000 mg | Freq: Once | INTRAMUSCULAR | Status: AC
  Administered 2015-10-29: 125 mg via INTRAVENOUS
  Filled 2015-10-29: qty 2

## 2015-10-29 MED ORDER — SODIUM CHLORIDE 0.9 % IV SOLN: INTRAVENOUS | Status: DC

## 2015-10-29 NOTE — Discharge Instructions (Signed)
Allergies An allergy is when your body reacts to a substance in a way that is not normal. An allergic reaction can happen after you:  Eat something.  Breathe in something.  Touch something. WHAT KINDS OF ALLERGIES ARE THERE? You can be allergic to:  Things that are only around during certain seasons, like molds and pollens.  Foods.  Drugs.  Insects.  Animal dander. WHAT ARE SYMPTOMS OF ALLERGIES?  Puffiness (swelling). This may happen on the lips, face, tongue, mouth, or throat.  Sneezing.  Coughing.  Breathing loudly (wheezing).  Stuffy nose.  Tingling in the mouth.  A rash.  Itching.  Itchy, red, puffy areas of skin (hives).  Watery eyes.  Throwing up (vomiting).  Watery poop (diarrhea).  Dizziness.  Feeling faint or fainting.  Trouble breathing or swallowing.  A tight feeling in the chest.  A fast heartbeat. HOW ARE ALLERGIES DIAGNOSED? Allergies can be diagnosed with:  A medical and family history.  Skin tests.  Blood tests.  A food diary. A food diary is a record of all the foods, drinks, and symptoms you have each day.  The results of an elimination diet. This diet involves making sure not to eat certain foods and then seeing what happens when you start eating them again. HOW ARE ALLERGIES TREATED? There is no cure for allergies, but allergic reactions can be treated with medicine. Severe reactions usually need to be treated at a hospital.  HOW CAN REACTIONS BE PREVENTED? The best way to prevent an allergic reaction is to avoid the thing you are allergic to. Allergy shots and medicines can also help prevent reactions in some cases.   This information is not intended to replace advice given to you by your health care provider. Make sure you discuss any questions you have with your health care provider.  Take medications as directed. Benadryl every 6 hours for the next 2 days. Pepcid for the next 7 days. Zone for the next 5 days. Return  for any new or worse symptoms. Would recommend avoiding any nuts and in particular walnuts.   Document Released: 09/15/2012 Document Revised: 06/11/2014 Document Reviewed: 03/02/2014 Elsevier Interactive Patient Education [employer].

## 2015-10-29 NOTE — ED Notes (Signed)
Pt alert & oriented x4, stable gait. Patient given discharge instructions, paperwork & prescription(s). Patient  instructed to stop at the registration desk to finish any additional paperwork. Patient verbalized understanding. Pt left department w/ no further questions. 

## 2015-10-29 NOTE — ED Provider Notes (Signed)
CSN: [id number]     Arrival date & time 10/29/15  1622 History   First MD Initiated Contact with Patient 10/29/15 1629     Chief Complaint  Patient presents with  . Allergic Reaction     (Consider location/radiation/quality/duration/timing/severity/associated sxs/prior Treatment) Patient is a 55 y.o. female presenting with allergic reaction. The history is provided by the patient and a relative.  Allergic Reaction Presenting symptoms: difficulty swallowing   Presenting symptoms: no wheezing   Patient with acute onset of itchy watery eyes hands itching throat feeling like it was tight tongue itching. Difficulty swallowing and also felt a little bit of difficulty with breathing. Occured 30 minutes prior to arrival. Occurred very shortly after eating some walnuts. Patient with no known food allergies. Patient denies any lip or tongue swelling.  Past Medical History  Diagnosis Date  . Diabetes mellitus   . GERD (gastroesophageal reflux disease)   . Arthritis   . Hypertension   . Fatty liver    Past Surgical History  Procedure Laterality Date  . Abdominal hysterectomy    . Cholecystectomy    . Middle ear surgery    . Colonoscopy N/A 09/22/2012    RMR: colonic polyps -removed as described above. tubular adenoma, next TCS 09/2017  . Esophagogastroduodenoscopy N/A 10/21/2014    RMR: Small hiatal hernia; otherwise normal EGD status post passage of a Maloney dilator.   [name] dilation N/A 10/21/2014    Procedure: [name] DILATION;  Surgeon: Daneil Dolin, MD;  Location: [facility];  Service: Endoscopy;  Laterality: N/A;   Family History  Problem Relation Age of Onset  . Colon cancer Mother     diagnosed age 54, underwent surgical resection, metastatic disease several years later  . Diabetes Mother   . Heart attack Father   . Diabetes Sister   . Hypothyroidism Brother   . Diabetes Sister   . Diabetes Sister   . Diabetes Sister   . Hypothyroidism Sister    Social History   Substance Use Topics  . Smoking status: Never Smoker   . Smokeless tobacco: Never Used  . Alcohol Use: No   OB History    No data available     Review of Systems  Constitutional: Negative for fever.  HENT: Positive for trouble swallowing. Negative for congestion.   Eyes: Positive for redness and itching.  Respiratory: Positive for shortness of breath. Negative for wheezing and stridor.   Gastrointestinal: Negative for nausea, vomiting and abdominal pain.  Genitourinary: Negative for dysuria.  Musculoskeletal: Negative for back pain and neck stiffness.  Allergic/Immunologic: Negative for environmental allergies and food allergies.  Neurological: Negative for headaches.  Hematological: Does not bruise/bleed easily.  Psychiatric/Behavioral: Negative for confusion.      Allergies  Review of patient's allergies indicates no known allergies.  Home Medications   Prior to Admission medications   Medication Sig Start Date End Date Taking? Authorizing Provider  aspirin EC 81 MG tablet Take 81 mg by mouth daily.   Yes Historical Provider, MD  beclomethasone (QVAR) 80 MCG/ACT inhaler Inhale 2 puffs into the lungs 2 (two) times daily.   Yes Historical Provider, MD  Cholecalciferol (VITAMIN D-3) 1000 UNITS CAPS Take 1 capsule by mouth daily.   Yes Historical Provider, MD  HYDROcodone-acetaminophen (NORCO) 7.5-325 MG per tablet Take 1 tablet by mouth every 6 (six) hours as needed for moderate pain.    Yes Historical Provider, MD  Insulin Aspart (NOVOLOG FLEXPEN [place]) Inject 10-16 Units into the skin  3 (three) times daily with meals.   Yes Historical Provider, MD  Insulin Glargine (LANTUS SOLOSTAR) 100 UNIT/ML Solostar Pen Inject 60 Units into the skin daily at 10 pm. 05/18/15  Yes Cassandria Anger, MD  INVOKANA 100 MG TABS tablet TAKE ONE (1) TABLET EACH DAY 08/15/15  Yes Cassandria Anger, MD  lisinopril-hydrochlorothiazide (PRINZIDE,ZESTORETIC) 20-25 MG tablet Take 1 tablet by mouth  daily. 05/18/15  Yes Cassandria Anger, MD  pantoprazole (PROTONIX) 40 MG tablet TAKE ONE (1) TABLET BY MOUTH EVERY DAY 10/11/15  Yes Mahala Menghini, PA-C  vitamin B-12 (CYANOCOBALAMIN) 100 MCG tablet Take 100 mcg by mouth daily.   Yes Historical Provider, MD  glucose blood (ACCU-CHEK COMPACT PLUS) test strip Use as instructed 05/18/15   Cassandria Anger, MD  Insulin Pen Needle (B-D ULTRAFINE III SHORT PEN) 31G X 8 MM MISC 1 each by Does not apply route as directed. 05/18/15   Cassandria Anger, MD  Lancets MISC 1 each by Does not apply route as directed. 05/18/15   Cassandria Anger, MD  simvastatin (ZOCOR) 20 MG tablet TAKE ONE TABLET DAILY AT BEDTIME Patient not taking: Reported on 10/29/2015 08/15/15   Cassandria Anger, MD   BP 145/78 mmHg  Pulse 97  Temp(Src) 98.2 F (36.8 C) (Oral)  Resp 18  Ht 5\' 5"  (1.651 m)  Wt 105.235 kg  BMI 38.61 kg/m2  SpO2 95% Physical Exam  Constitutional: She is oriented to person, place, and time. She appears well-developed and well-nourished. No distress.  HENT:  Head: Normocephalic and atraumatic.  Mouth/Throat: Oropharynx is clear and moist. No oropharyngeal exudate.  No swelling to the tongue or lips. Uvula midline.  Eyes: EOM are normal. Pupils are equal, round, and reactive to light.  Sclera bilaterally injected and edematous.  Neck: Normal range of motion. Neck supple. No tracheal deviation present.  Cardiovascular: Normal rate, regular rhythm and normal heart sounds.   No murmur heard. Pulmonary/Chest: Effort normal and breath sounds normal. No respiratory distress.  Abdominal: Soft. Bowel sounds are normal. There is no tenderness.  Musculoskeletal: Normal range of motion. She exhibits no edema.  Neurological: She is alert and oriented to person, place, and time. No cranial nerve deficit. She exhibits normal muscle tone. Coordination normal.  Skin: Skin is warm. There is erythema.  Nursing note and vitals reviewed.   ED  Course  Procedures (including critical care time) Labs Review Labs Reviewed  BASIC METABOLIC PANEL - Abnormal; Notable for the following:    Glucose, Bld 218 (*)    All other components within normal limits  CBC WITH DIFFERENTIAL/PLATELET - Abnormal; Notable for the following:    RBC 5.44 (*)    Hemoglobin 15.4 (*)    HCT 47.8 (*)    All other components within normal limits   Results for orders placed or performed during the hospital encounter of [id number]  Basic metabolic panel  Result Value Ref Range   Sodium 139 135 - 145 mmol/L   Potassium 3.7 3.5 - 5.1 mmol/L   Chloride 104 101 - 111 mmol/L   CO2 25 22 - 32 mmol/L   Glucose, Bld 218 (H) 65 - 99 mg/dL   BUN 19 6 - 20 mg/dL   Creatinine, Ser 0.82 0.44 - 1.00 mg/dL   Calcium 9.7 8.9 - 10.3 mg/dL   GFR calc non Af Amer >60 >60 mL/min   GFR calc Af Amer >60 >60 mL/min   Anion gap 10 5 - 15  CBC with Differential/Platelet  Result Value Ref Range   WBC 6.9 4.0 - 10.5 K/uL   RBC 5.44 (H) 3.87 - 5.11 MIL/uL   Hemoglobin 15.4 (H) 12.0 - 15.0 g/dL   HCT 47.8 (H) 36.0 - 46.0 %   MCV 87.9 78.0 - 100.0 fL   MCH 28.3 26.0 - 34.0 pg   MCHC 32.2 30.0 - 36.0 g/dL   RDW 14.7 11.5 - 15.5 %   Platelets 269 150 - 400 K/uL   Neutrophils Relative % 67 %   Neutro Abs 4.6 1.7 - 7.7 K/uL   Lymphocytes Relative 25 %   Lymphs Abs 1.7 0.7 - 4.0 K/uL   Monocytes Relative 7 %   Monocytes Absolute 0.5 0.1 - 1.0 K/uL   Eosinophils Relative 1 %   Eosinophils Absolute 0.1 0.0 - 0.7 K/uL   Basophils Relative 0 %   Basophils Absolute 0.0 0.0 - 0.1 K/uL    Imaging Review No results found. I have personally reviewed and evaluated these images and lab results as part of my medical decision-making.   EKG Interpretation None      MDM   Final diagnoses:  Allergic reaction, initial encounter    Patient presenting with symptoms consistent with an allergic reaction. Itchy watery eyes itchy tone itchy neck palms itchy. No tongue swelling no lip  swelling. Throat felt tight. All symptoms resolved here with IV Benadryl Pepcid and Solu-Medrol. Patient stated symptoms started shortly after eating walnuts. Most likely the culprit. No known history of walnuts in the past. Patient nontoxic no acute distress now. Will be continued at home on Benadryl Pepcid and prednisone.   Fredia Sorrow, MD 10/29/15 [phone]

## 2015-10-29 NOTE — ED Notes (Signed)
Reports of eyes watering, hands itching, and throat feels a little tight. Denies difficulty swallowing or breathing. No swelling noted to tonge. Stated 30 minutes ago.

## 2015-11-01 ENCOUNTER — Other Ambulatory Visit (HOSPITAL_COMMUNITY): Payer: Self-pay | Admitting: Pulmonary Disease

## 2015-11-01 DIAGNOSIS — Z1231 Encounter for screening mammogram for malignant neoplasm of breast: Secondary | ICD-10-CM

## 2015-11-14 ENCOUNTER — Other Ambulatory Visit: Payer: Self-pay | Admitting: "Endocrinology

## 2015-11-15 ENCOUNTER — Other Ambulatory Visit: Payer: Self-pay

## 2015-11-15 MED ORDER — INSULIN GLARGINE 100 UNIT/ML SOLOSTAR PEN: PEN_INJECTOR | SUBCUTANEOUS | Status: DC

## 2015-12-05 ENCOUNTER — Other Ambulatory Visit: Payer: Self-pay | Admitting: "Endocrinology

## 2015-12-05 ENCOUNTER — Ambulatory Visit (HOSPITAL_COMMUNITY): Payer: Medicaid Other

## 2015-12-05 ENCOUNTER — Ambulatory Visit (HOSPITAL_COMMUNITY)
Admission: RE | Admit: 2015-12-05 | Discharge: 2015-12-05 | Disposition: A | Discharge status: Home / Self Care | Payer: Medicaid Other | Source: Ambulatory Visit | Attending: Pulmonary Disease | Admitting: Pulmonary Disease

## 2015-12-05 DIAGNOSIS — Z1231 Encounter for screening mammogram for malignant neoplasm of breast: Secondary | ICD-10-CM | POA: Diagnosis not present

## 2015-12-05 LAB — BASIC METABOLIC PANEL
BUN: 12 mg/dL (ref 7–25)
CALCIUM: 9.2 mg/dL (ref 8.6–10.4)
CO2: 24 mmol/L (ref 20–31)
CREATININE: 0.67 mg/dL (ref 0.50–1.05)
Chloride: 106 mmol/L (ref 98–110)
Glucose, Bld: 126 mg/dL — ABNORMAL HIGH (ref 65–99)
Potassium: 4.2 mmol/L (ref 3.5–5.3)
Sodium: 140 mmol/L (ref 135–146)

## 2015-12-05 LAB — HEMOGLOBIN A1C
HEMOGLOBIN A1C: 8.6 % — AB (ref <5.7)
MEAN PLASMA GLUCOSE: 200 mg/dL

## 2015-12-13 ENCOUNTER — Encounter: Payer: Self-pay | Admitting: "Endocrinology

## 2015-12-13 ENCOUNTER — Ambulatory Visit (INDEPENDENT_AMBULATORY_CARE_PROVIDER_SITE_OTHER): Payer: Medicaid Other | Admitting: "Endocrinology

## 2015-12-13 VITALS — BP 131/80 | HR 76 | Ht 65.0 in | Wt 239.0 lb

## 2015-12-13 DIAGNOSIS — Z9119 Patient's noncompliance with other medical treatment and regimen: Secondary | ICD-10-CM

## 2015-12-13 DIAGNOSIS — E785 Hyperlipidemia, unspecified: Secondary | ICD-10-CM

## 2015-12-13 DIAGNOSIS — Z91199 Patient's noncompliance with other medical treatment and regimen due to unspecified reason: Secondary | ICD-10-CM

## 2015-12-13 DIAGNOSIS — E1165 Type 2 diabetes mellitus with hyperglycemia: Secondary | ICD-10-CM | POA: Diagnosis not present

## 2015-12-13 DIAGNOSIS — E118 Type 2 diabetes mellitus with unspecified complications: Secondary | ICD-10-CM | POA: Diagnosis not present

## 2015-12-13 DIAGNOSIS — I10 Essential (primary) hypertension: Secondary | ICD-10-CM | POA: Diagnosis not present

## 2015-12-13 DIAGNOSIS — Z794 Long term (current) use of insulin: Secondary | ICD-10-CM

## 2015-12-13 DIAGNOSIS — IMO0002 Reserved for concepts with insufficient information to code with codable children: Secondary | ICD-10-CM

## 2015-12-13 NOTE — Progress Notes (Signed)
Subjective:    Patient ID: Sandy Valencia, female    DOB: 06-21-1960, PCP Sandy Bogus, MD   Past Medical History  Diagnosis Date  . Diabetes mellitus   . GERD (gastroesophageal reflux disease)   . Arthritis   . Hypertension   . Fatty liver    Past Surgical History  Procedure Laterality Date  . Abdominal hysterectomy    . Cholecystectomy    . Middle ear surgery    . Colonoscopy N/A 09/22/2012    RMR: colonic polyps -removed as described above. tubular adenoma, next TCS 09/2017  . Esophagogastroduodenoscopy N/A 10/21/2014    RMR: Small hiatal hernia; otherwise normal EGD status post passage of a Maloney dilator.   [name] dilation N/A 10/21/2014    Procedure: [name] DILATION;  Surgeon: Daneil Dolin, MD;  Location: [facility];  Service: Endoscopy;  Laterality: N/A;   Social History   Social History  . Marital Status: Divorced    Spouse Name: N/A  . Number of Children: N/A  . Years of Education: N/A   Social History Main Topics  . Smoking status: Never Smoker   . Smokeless tobacco: Never Used  . Alcohol Use: No  . Drug Use: No  . Sexual Activity: Yes    Birth Control/ Protection: Surgical   Other Topics Concern  . None   Social History Narrative   Outpatient Encounter Prescriptions as of 12/13/2015  Medication Sig  . aspirin EC 81 MG tablet Take 81 mg by mouth daily.  . beclomethasone (QVAR) 80 MCG/ACT inhaler Inhale 2 puffs into the lungs 2 (two) times daily.  . Cholecalciferol (VITAMIN D-3) 1000 UNITS CAPS Take 1 capsule by mouth daily.  . diphenhydrAMINE (BENADRYL) 25 MG tablet Take 1 tablet (25 mg total) by mouth every 6 (six) hours.  . famotidine (PEPCID) 20 MG tablet Take 1 tablet (20 mg total) by mouth 2 (two) times daily.  Marland Kitchen glucose blood (ACCU-CHEK COMPACT PLUS) test strip Use as instructed  . HYDROcodone-acetaminophen (NORCO) 7.5-325 MG per tablet Take 1 tablet by mouth every 6 (six) hours as needed for moderate pain.   . Insulin Aspart  (NOVOLOG FLEXPEN [place]) Inject 14-20 Units into the skin 3 (three) times daily with meals.  . Insulin Glargine (LANTUS SOLOSTAR) 100 UNIT/ML Solostar Pen INJECT 60 UNITS BID  . Insulin Pen Needle (B-D ULTRAFINE III SHORT PEN) 31G X 8 MM MISC 1 each by Does not apply route as directed.  . INVOKANA 100 MG TABS tablet TAKE ONE (1) TABLET EACH DAY  . Lancets MISC 1 each by Does not apply route as directed.  Marland Kitchen lisinopril-hydrochlorothiazide (PRINZIDE,ZESTORETIC) 20-25 MG tablet Take 1 tablet by mouth daily.  Marland Kitchen NOVOLOG FLEXPEN 100 UNIT/ML FlexPen INJECT 25 UNITS INTO THE SKIN 3 TIMES DAILY WITH MEALS (Patient taking differently: 10-16 units TIDAC)  . pantoprazole (PROTONIX) 40 MG tablet TAKE ONE (1) TABLET BY MOUTH EVERY DAY  . simvastatin (ZOCOR) 20 MG tablet TAKE ONE TABLET DAILY AT BEDTIME (Patient not taking: Reported on 10/29/2015)  . vitamin B-12 (CYANOCOBALAMIN) 100 MCG tablet Take 100 mcg by mouth daily.  . [DISCONTINUED] predniSONE (DELTASONE) 10 MG tablet Take 4 tablets (40 mg total) by mouth daily.   No facility-administered encounter medications on file as of 12/13/2015.   ALLERGIES: No Known Allergies VACCINATION STATUS: There is no immunization history for the selected administration types on file for this patient.  Diabetes She presents for her follow-up diabetic visit. She has type 2 diabetes  mellitus. Her disease course has been worsening (She was diagnosed at approximate age of 32 years.). There are no hypoglycemic associated symptoms. Pertinent negatives for hypoglycemia include no confusion, headaches, pallor or seizures. There are no diabetic associated symptoms. Pertinent negatives for diabetes include no chest pain, no polydipsia, no polyphagia and no polyuria. There are no hypoglycemic complications. Symptoms are worsening. There are no diabetic complications. (She has history of noncompliance.) Risk factors for coronary artery disease include dyslipidemia, diabetes mellitus,  hypertension, sedentary lifestyle and tobacco exposure. Current diabetic treatment includes insulin injections (She only administered 50% of prescribed insulin.). She is compliant with treatment some of the time. Her weight is stable. She is following a generally unhealthy diet. She never participates in exercise. Her home blood glucose trend is decreasing rapidly. Her breakfast blood glucose range is generally 140-180 mg/dl. Her lunch blood glucose range is generally 140-180 mg/dl. Her dinner blood glucose range is generally 140-180 mg/dl. Her overall blood glucose range is 140-180 mg/dl. An ACE inhibitor/angiotensin II receptor blocker is being taken.  Hyperlipidemia This is a chronic problem. The current episode started more than 1 year ago. The problem is uncontrolled. Pertinent negatives include no chest pain, myalgias or shortness of breath. Current antihyperlipidemic treatment includes statins. Risk factors for coronary artery disease include diabetes mellitus, dyslipidemia, hypertension, obesity and a sedentary lifestyle.  Hypertension This is a chronic problem. The current episode started more than 1 year ago. The problem is uncontrolled. Pertinent negatives include no chest pain, headaches, palpitations or shortness of breath. Risk factors for coronary artery disease include dyslipidemia, diabetes mellitus, obesity, sedentary lifestyle and smoking/tobacco exposure. Past treatments include nothing.     Review of Systems  Constitutional: Negative for unexpected weight change.  HENT: Negative for trouble swallowing and voice change.   Eyes: Negative for visual disturbance.  Respiratory: Negative for cough, shortness of breath and wheezing.   Cardiovascular: Negative for chest pain, palpitations and leg swelling.  Gastrointestinal: Negative for nausea, vomiting and diarrhea.  Endocrine: Negative for cold intolerance, heat intolerance, polydipsia, polyphagia and polyuria.  Musculoskeletal:  Negative for myalgias and arthralgias.  Skin: Negative for color change, pallor, rash and wound.  Neurological: Negative for seizures and headaches.  Psychiatric/Behavioral: Negative for suicidal ideas and confusion.    Objective:    BP 131/80 mmHg  Pulse 76  Ht 5\' 5"  (1.651 m)  Wt 239 lb (108.41 kg)  BMI 39.77 kg/m2  Wt Readings from Last 3 Encounters:  12/13/15 239 lb (108.41 kg)  10/29/15 232 lb (105.235 kg)  08/24/15 242 lb (109.77 kg)    Physical Exam  Constitutional: She is oriented to person, place, and time. She appears well-developed.  HENT:  Head: Normocephalic and atraumatic.  Eyes: EOM are normal.  Neck: Normal range of motion. Neck supple. No tracheal deviation present. No thyromegaly present.  Cardiovascular: Normal rate and regular rhythm.   Pulmonary/Chest: Effort normal and breath sounds normal.  Abdominal: Soft. Bowel sounds are normal. There is no tenderness. There is no guarding.  Musculoskeletal: Normal range of motion. She exhibits no edema.  Neurological: She is alert and oriented to person, place, and time. She has normal reflexes. No cranial nerve deficit. Coordination normal.  Skin: Skin is warm and dry. No rash noted. No erythema. No pallor.  Psychiatric: She has a normal mood and affect. Judgment normal.     Complete Blood Count (Most recent): Lab Results  Component Value Date   WBC 6.9 10/29/2015   HGB 15.4* 10/29/2015  HCT 47.8* 10/29/2015   MCV 87.9 10/29/2015   PLT 269 10/29/2015   Chemistry (most recent): Lab Results  Component Value Date   NA 140 12/05/2015   K 4.2 12/05/2015   CL 106 12/05/2015   CO2 24 12/05/2015   BUN 12 12/05/2015   CREATININE 0.67 12/05/2015    Assessment & Plan:   1. Uncontrolled type 2 diabetes mellitus with complication, with long-term current use of insulin ([place])   -her  diabetes is  complicated by noncompliance and patient remains at a high risk for more acute and chronic complications of diabetes  which include CAD, CVA, CKD, retinopathy, and neuropathy. These are all discussed in detail with the patient. - Her average blood glucose is 180. Recent labs reviewed.  Patient came with A1c of 8.6% increasing from 8%. - I have re-counseled the patient on diet management and weight loss  by adopting a carbohydrate restricted / protein rich  Diet.  - Suggestion is made for patient to avoid simple carbohydrates   from their diet including Cakes , Desserts, Ice Cream,  Soda (  diet and regular) , Sweet Tea , Candies,  Chips, Cookies, Artificial Sweeteners,   and "Sugar-free" Products .  This will help patient to have stable blood glucose profile and potentially avoid unintended  Weight gain.  - Patient is advised to stick to a routine mealtimes to eat 3 meals  a day and avoid unnecessary snacks ( to snack only to correct hypoglycemia).  - The patient  has been  scheduled with Jearld Fenton, RDN, CDE for individualized DM education.  - I have approached patient with the following individualized plan to manage diabetes and patient agrees.  - She is advised to continue Lantus 60 qhs, increase Novolog to 14 units TIDAC plus SSI. Pt to continue to monitor BG ac and hs.  - She is advised to keep her appointment with Jearld Fenton, CDE for individualized DM education. I will continue Simvastatin 20mg  po qhs. -Continue Invokana 137mmg po qam. SE discussed again to her.  - Patient will be considered for incretin therapy as appropriate next visit. - Patient specific target  for A1c; LDL, HDL, Triglycerides, and  Waist Circumference were discussed in detail.  2) BP/HTN: Uncontrolled.   I have initiated hydrochlorothiazide/lisinopril 25/20 mg by mouth daily. I counseled her on salt restriction.   3) Lipids/HPL:  continue statins. 4)  Weight/Diet: CDE consult in progress, exercise, and carbohydrates information provided. 5. Personal history of noncompliance with medical treatment, presenting hazards to  health -I counseled her on the need to stay focused and treat diabetes intensively to  avoid competitions.  6) Chronic Care/Health Maintenance:  -Patient is on ACEI/ARB and Statin medications and encouraged to continue to follow up with Ophthalmology, Podiatrist at least yearly or according to recommendations, and advised to  stay away from smoking. I have recommended yearly flu vaccine and pneumonia vaccination at least every 5 years; moderate intensity exercise for up to 150 minutes weekly; and  sleep for at least 7 hours a day.  - 25 minutes of time was spent on the care of this patient , 50% of which was applied for counseling on diabetes complications and their preventions.  - I advised patient to maintain close follow up with HAWKINS,EDWARD L, MD for primary care needs.  Patient is asked to bring meter and  blood glucose logs during their next visit.   Follow up plan: -Return in about 3 months (around 03/14/2016) for follow up  with pre-visit labs, meter, and logs.  Glade Lloyd, MD Phone: [phone]  Fax: [phone]   12/13/2015, 8:35 AM

## 2015-12-13 NOTE — Patient Instructions (Signed)

## 2016-01-23 ENCOUNTER — Other Ambulatory Visit (HOSPITAL_COMMUNITY): Payer: Self-pay | Admitting: Pulmonary Disease

## 2016-01-23 ENCOUNTER — Ambulatory Visit (HOSPITAL_COMMUNITY)
Admission: RE | Admit: 2016-01-23 | Discharge: 2016-01-23 | Disposition: A | Discharge status: Home / Self Care | Payer: Medicaid Other | Source: Ambulatory Visit | Attending: Pulmonary Disease | Admitting: Pulmonary Disease

## 2016-01-23 DIAGNOSIS — M545 Low back pain: Secondary | ICD-10-CM

## 2016-01-23 DIAGNOSIS — M4180 Other forms of scoliosis, site unspecified: Secondary | ICD-10-CM | POA: Diagnosis not present

## 2016-01-23 DIAGNOSIS — M5136 Other intervertebral disc degeneration, lumbar region: Secondary | ICD-10-CM | POA: Diagnosis not present

## 2016-02-01 ENCOUNTER — Encounter: Payer: Self-pay | Admitting: Internal Medicine

## 2016-02-08 ENCOUNTER — Telehealth: Payer: Self-pay | Admitting: Internal Medicine

## 2016-02-08 NOTE — Telephone Encounter (Signed)
Pt received a letter from [place] that it was time for her one year follow up. She said she was doing fine and didn't want to schedule an appt at this time and if she needed [place] she would call to make [place].

## 2016-02-14 ENCOUNTER — Other Ambulatory Visit: Payer: Self-pay | Admitting: "Endocrinology

## 2016-02-20 ENCOUNTER — Ambulatory Visit (INDEPENDENT_AMBULATORY_CARE_PROVIDER_SITE_OTHER): Payer: Medicaid Other | Admitting: Otolaryngology

## 2016-02-20 DIAGNOSIS — H95121 Granulation of postmastoidectomy cavity, right ear: Secondary | ICD-10-CM

## 2016-03-07 ENCOUNTER — Other Ambulatory Visit: Payer: Self-pay | Admitting: "Endocrinology

## 2016-03-07 LAB — COMPLETE METABOLIC PANEL WITH GFR
ALT: 27 U/L (ref 6–29)
AST: 19 U/L (ref 10–35)
Albumin: 4.2 g/dL (ref 3.6–5.1)
Alkaline Phosphatase: 59 U/L (ref 33–130)
BUN: 15 mg/dL (ref 7–25)
CALCIUM: 9.3 mg/dL (ref 8.6–10.4)
CHLORIDE: 104 mmol/L (ref 98–110)
CO2: 26 mmol/L (ref 20–31)
Creat: 0.7 mg/dL (ref 0.50–1.05)
GFR, Est Non African American: >89 mL/min (ref >=60)
Glucose, Bld: 144 mg/dL — ABNORMAL HIGH (ref 65–99)
POTASSIUM: 4.2 mmol/L (ref 3.5–5.3)
Sodium: 140 mmol/L (ref 135–146)
Total Bilirubin: 0.4 mg/dL (ref 0.2–1.2)
Total Protein: 6.8 g/dL (ref 6.1–8.1)

## 2016-03-07 LAB — LIPID PANEL
Cholesterol: 120 mg/dL — ABNORMAL LOW (ref 125–200)
HDL: 64 mg/dL (ref >=46)
LDL CALC: 46 mg/dL (ref <130)
Total CHOL/HDL Ratio: 1.9 Ratio (ref <=5.0)
Triglycerides: 50 mg/dL (ref <150)
VLDL: 10 mg/dL (ref <30)

## 2016-03-08 LAB — HEMOGLOBIN A1C
HEMOGLOBIN A1C: 8.4 % — AB (ref <5.7)
MEAN PLASMA GLUCOSE: 194 mg/dL

## 2016-03-12 ENCOUNTER — Other Ambulatory Visit: Payer: Self-pay | Admitting: "Endocrinology

## 2016-03-12 ENCOUNTER — Ambulatory Visit (INDEPENDENT_AMBULATORY_CARE_PROVIDER_SITE_OTHER): Payer: Medicaid Other | Admitting: "Endocrinology

## 2016-03-12 ENCOUNTER — Encounter: Payer: Self-pay | Admitting: "Endocrinology

## 2016-03-12 VITALS — BP 138/83 | HR 73 | Ht 65.0 in | Wt 235.0 lb

## 2016-03-12 DIAGNOSIS — E118 Type 2 diabetes mellitus with unspecified complications: Secondary | ICD-10-CM | POA: Diagnosis not present

## 2016-03-12 DIAGNOSIS — E1165 Type 2 diabetes mellitus with hyperglycemia: Secondary | ICD-10-CM | POA: Diagnosis not present

## 2016-03-12 DIAGNOSIS — Z794 Long term (current) use of insulin: Secondary | ICD-10-CM

## 2016-03-12 DIAGNOSIS — E7800 Pure hypercholesterolemia, unspecified: Secondary | ICD-10-CM

## 2016-03-12 DIAGNOSIS — I10 Essential (primary) hypertension: Secondary | ICD-10-CM

## 2016-03-12 DIAGNOSIS — IMO0002 Reserved for concepts with insufficient information to code with codable children: Secondary | ICD-10-CM

## 2016-03-12 MED ORDER — SIMVASTATIN 10 MG PO TABS: 10.0000 mg | ORAL_TABLET | Freq: Every day | ORAL | 3 refills | Status: DC

## 2016-03-12 NOTE — Patient Instructions (Signed)

## 2016-03-12 NOTE — Progress Notes (Signed)
Subjective:    Patient ID: Sandy Valencia, female    DOB: 1960/11/20, PCP Alonza Bogus, MD   Past Medical History:  Diagnosis Date  . Arthritis   . Diabetes mellitus   . Fatty liver   . GERD (gastroesophageal reflux disease)   . Hypertension    Past Surgical History:  Procedure Laterality Date  . ABDOMINAL HYSTERECTOMY    . CHOLECYSTECTOMY    . COLONOSCOPY N/A 09/22/2012   RMR: colonic polyps -removed as described above. tubular adenoma, next TCS 09/2017  . ESOPHAGOGASTRODUODENOSCOPY N/A 10/21/2014   RMR: Small hiatal hernia; otherwise normal EGD status post passage of a Maloney dilator.   Marland Kitchen MALONEY DILATION N/A 10/21/2014   Procedure: [name] DILATION;  Surgeon: Daneil Dolin, MD;  Location: [facility];  Service: Endoscopy;  Laterality: N/A;  . MIDDLE EAR SURGERY     Social History   Social History  . Marital status: Divorced    Spouse name: N/A  . Number of children: N/A  . Years of education: N/A   Social History Main Topics  . Smoking status: Never Smoker  . Smokeless tobacco: Never Used  . Alcohol use No  . Drug use: No  . Sexual activity: Yes    Birth control/ protection: Surgical   Other Topics Concern  . None   Social History Narrative  . None   Outpatient Encounter Prescriptions as of 03/12/2016  Medication Sig  . insulin aspart (NOVOLOG FLEXPEN) 100 UNIT/ML FlexPen Inject 16-22 Units into the skin 3 (three) times daily with meals.  Marland Kitchen aspirin EC 81 MG tablet Take 81 mg by mouth daily.  . beclomethasone (QVAR) 80 MCG/ACT inhaler Inhale 2 puffs into the lungs 2 (two) times daily.  . Cholecalciferol (VITAMIN D-3) 1000 UNITS CAPS Take 1 capsule by mouth daily.  . diphenhydrAMINE (BENADRYL) 25 MG tablet Take 1 tablet (25 mg total) by mouth every 6 (six) hours.  . famotidine (PEPCID) 20 MG tablet Take 1 tablet (20 mg total) by mouth 2 (two) times daily.  Marland Kitchen glucose blood (ACCU-CHEK COMPACT PLUS) test strip Use as instructed  . HYDROcodone-acetaminophen  (NORCO) 7.5-325 MG per tablet Take 1 tablet by mouth every 6 (six) hours as needed for moderate pain.   . Insulin Aspart (NOVOLOG FLEXPEN [place]) Inject 14-20 Units into the skin 3 (three) times daily with meals.  . Insulin Pen Needle (B-D ULTRAFINE III SHORT PEN) 31G X 8 MM MISC 1 each by Does not apply route as directed.  . INVOKANA 100 MG TABS tablet TAKE ONE (1) TABLET EACH DAY  . Lancets MISC 1 each by Does not apply route as directed.  Marland Kitchen LANTUS SOLOSTAR 100 UNIT/ML Solostar Pen INJECT 60 UNITS SUBCUTANEOULY TWICE A DAY  . lisinopril-hydrochlorothiazide (PRINZIDE,ZESTORETIC) 20-25 MG tablet Take 1 tablet by mouth daily.  . pantoprazole (PROTONIX) 40 MG tablet TAKE ONE (1) TABLET BY MOUTH EVERY DAY  . simvastatin (ZOCOR) 10 MG tablet Take 1 tablet (10 mg total) by mouth at bedtime.  . vitamin B-12 (CYANOCOBALAMIN) 100 MCG tablet Take 100 mcg by mouth daily.  . [DISCONTINUED] Insulin Glargine (LANTUS SOLOSTAR) 100 UNIT/ML Solostar Pen INJECT 60 UNITS BID  . [DISCONTINUED] NOVOLOG FLEXPEN 100 UNIT/ML FlexPen INJECT 25 UNITS INTO THE SKIN 3 TIMES DAILY WITH MEALS (Patient taking differently: 10-16 units TIDAC)  . [DISCONTINUED] NOVOLOG FLEXPEN 100 UNIT/ML FlexPen INJECT 25 UNITS INTO THE SKIN 3 TIMES DAILY WITH MEALS  . [DISCONTINUED] simvastatin (ZOCOR) 20 MG tablet TAKE ONE TABLET  DAILY AT BEDTIME   No facility-administered encounter medications on file as of 03/12/2016.    ALLERGIES: No Known Allergies VACCINATION STATUS: There is no immunization history for the selected administration types on file for this patient.  Diabetes  She presents for her follow-up diabetic visit. She has type 2 diabetes mellitus. Her disease course has been stable (She was diagnosed at approximate age of 14 years.). There are no hypoglycemic associated symptoms. Pertinent negatives for hypoglycemia include no confusion, headaches, pallor or seizures. There are no diabetic associated symptoms. Pertinent negatives for  diabetes include no chest pain, no polydipsia, no polyphagia and no polyuria. There are no hypoglycemic complications. Symptoms are stable. There are no diabetic complications. (She has history of noncompliance.) Risk factors for coronary artery disease include dyslipidemia, diabetes mellitus, hypertension, sedentary lifestyle and tobacco exposure. Current diabetic treatment includes insulin injections (She only administered 50% of prescribed insulin.). She is compliant with treatment some of the time. Her weight is stable. She is following a generally unhealthy diet. She never participates in exercise. Her home blood glucose trend is decreasing rapidly. Her breakfast blood glucose range is generally 140-180 mg/dl. Her lunch blood glucose range is generally 140-180 mg/dl. Her dinner blood glucose range is generally 140-180 mg/dl. Her overall blood glucose range is 140-180 mg/dl. An ACE inhibitor/angiotensin II receptor blocker is being taken.  Hyperlipidemia  This is a chronic problem. The current episode started more than 1 year ago. The problem is uncontrolled. Pertinent negatives include no chest pain, myalgias or shortness of breath. Current antihyperlipidemic treatment includes statins. Risk factors for coronary artery disease include diabetes mellitus, dyslipidemia, hypertension, obesity and a sedentary lifestyle.  Hypertension  This is a chronic problem. The current episode started more than 1 year ago. The problem is uncontrolled. Pertinent negatives include no chest pain, headaches, palpitations or shortness of breath. Risk factors for coronary artery disease include dyslipidemia, diabetes mellitus, obesity, sedentary lifestyle and smoking/tobacco exposure. Past treatments include nothing.     Review of Systems  Constitutional: Negative for unexpected weight change.  HENT: Negative for trouble swallowing and voice change.   Eyes: Negative for visual disturbance.  Respiratory: Negative for cough,  shortness of breath and wheezing.   Cardiovascular: Negative for chest pain, palpitations and leg swelling.  Gastrointestinal: Negative for diarrhea, nausea and vomiting.  Endocrine: Negative for cold intolerance, heat intolerance, polydipsia, polyphagia and polyuria.  Musculoskeletal: Negative for arthralgias and myalgias.  Skin: Negative for color change, pallor, rash and wound.  Neurological: Negative for seizures and headaches.  Psychiatric/Behavioral: Negative for confusion and suicidal ideas.    Objective:    BP 138/83   Pulse 73   Ht 5\' 5"  (1.651 m)   Wt 235 lb (106.6 kg)   BMI 39.11 kg/m   Wt Readings from Last 3 Encounters:  03/12/16 235 lb (106.6 kg)  12/13/15 239 lb (108.4 kg)  10/29/15 232 lb (105.2 kg)    Physical Exam  Constitutional: She is oriented to person, place, and time. She appears well-developed.  HENT:  Head: Normocephalic and atraumatic.  Eyes: EOM are normal.  Neck: Normal range of motion. Neck supple. No tracheal deviation present. No thyromegaly present.  Cardiovascular: Normal rate and regular rhythm.   Pulmonary/Chest: Effort normal and breath sounds normal.  Abdominal: Soft. Bowel sounds are normal. There is no tenderness. There is no guarding.  Musculoskeletal: Normal range of motion. She exhibits no edema.  Neurological: She is alert and oriented to person, place, and time. She has  normal reflexes. No cranial nerve deficit. Coordination normal.  Skin: Skin is warm and dry. No rash noted. No erythema. No pallor.  Psychiatric: She has a normal mood and affect. Judgment normal.     Complete Blood Count (Most recent): Lab Results  Component Value Date   WBC 6.9 10/29/2015   HGB 15.4 (H) 10/29/2015   HCT 47.8 (H) 10/29/2015   MCV 87.9 10/29/2015   PLT 269 10/29/2015   Chemistry (most recent): Lab Results  Component Value Date   NA 140 03/07/2016   K 4.2 03/07/2016   CL 104 03/07/2016   CO2 26 03/07/2016   BUN 15 03/07/2016    CREATININE 0.70 03/07/2016    Assessment & Plan:   1. Uncontrolled type 2 diabetes mellitus with complication, with long-term current use of insulin ([place])   -her  diabetes is  complicated by noncompliance and patient remains at a high risk for more acute and chronic complications of diabetes which include CAD, CVA, CKD, retinopathy, and neuropathy. These are all discussed in detail with the patient. - Her average blood glucose is 180. Recent labs reviewed.  Patient came with A1c of 8.4% Unchanged from 8.6%.  - I have re-counseled the patient on diet management and weight loss  by adopting a carbohydrate restricted / protein rich  Diet.  - Suggestion is made for patient to avoid simple carbohydrates   from their diet including Cakes , Desserts, Ice Cream,  Soda (  diet and regular) , Sweet Tea , Candies,  Chips, Cookies, Artificial Sweeteners,   and "Sugar-free" Products .  This will help patient to have stable blood glucose profile and potentially avoid unintended  Weight gain.  - Patient is advised to stick to a routine mealtimes to eat 3 meals  a day and avoid unnecessary snacks ( to snack only to correct hypoglycemia).  - The patient  has been  scheduled with Jearld Fenton, RDN, CDE for individualized DM education.  - I have approached patient with the following individualized plan to manage diabetes and patient agrees.  - She is advised to continue Lantus 60 qhs, increase Novolog to 16 units TIDAC plus SSI. Pt to continue to monitor BG ac and hs.  - She is advised to keep her appointment with Jearld Fenton, CDE for individualized DM education. I will continue Simvastatin 20mg  po qhs. -Continue Invokana 173mmg po qam. SE discussed again to her.  - Patient will be considered for incretin therapy as appropriate next visit. - Patient specific target  for A1c; LDL, HDL, Triglycerides, and  Waist Circumference were discussed in detail.  2) BP/HTN: Uncontrolled.   I have initiated  hydrochlorothiazide/lisinopril 25/20 mg by mouth daily. I counseled her on salt restriction.   3) Lipids/HPL:  continue statins. 4)  Weight/Diet: CDE consult in progress, exercise, and carbohydrates information provided. - I discussed  bariatric surgery with her which would help her significantly improved her chance of controlling diabetes without insulin. Brochure and contact information given to her.  5. Personal history of noncompliance with medical treatment, presenting hazards to health -I counseled her on the need to stay focused and treat diabetes intensively to  avoid competitions.  6) Chronic Care/Health Maintenance:  -Patient is on ACEI/ARB and Statin medications and encouraged to continue to follow up with Ophthalmology, Podiatrist at least yearly or according to recommendations, and advised to  stay away from smoking. I have recommended yearly flu vaccine and pneumonia vaccination at least every 5 years; moderate intensity exercise for  up to 150 minutes weekly; and  sleep for at least 7 hours a day.  - 25 minutes of time was spent on the care of this patient , 50% of which was applied for counseling on diabetes complications and their preventions.  - I advised patient to maintain close follow up with HAWKINS,EDWARD L, MD for primary care needs.  Patient is asked to bring meter and  blood glucose logs during their next visit.   Follow up plan: -Return in about 3 months (around 06/12/2016) for follow up with pre-visit labs, meter, and logs.  Glade Lloyd, MD Phone: [phone]  Fax: [phone]   03/12/2016, 12:07 PM

## 2016-05-10 ENCOUNTER — Other Ambulatory Visit: Payer: Self-pay | Admitting: "Endocrinology

## 2016-05-24 ENCOUNTER — Ambulatory Visit (INDEPENDENT_AMBULATORY_CARE_PROVIDER_SITE_OTHER): Payer: Medicaid Other | Admitting: Otolaryngology

## 2016-05-24 DIAGNOSIS — H95121 Granulation of postmastoidectomy cavity, right ear: Secondary | ICD-10-CM

## 2016-06-11 ENCOUNTER — Other Ambulatory Visit: Payer: Self-pay | Admitting: "Endocrinology

## 2016-06-15 ENCOUNTER — Other Ambulatory Visit: Payer: Self-pay | Admitting: "Endocrinology

## 2016-06-15 LAB — COMPREHENSIVE METABOLIC PANEL
ALBUMIN: 4.3 g/dL (ref 3.6–5.1)
ALK PHOS: 67 U/L (ref 33–130)
ALT: 28 U/L (ref 6–29)
AST: 19 U/L (ref 10–35)
BUN: 13 mg/dL (ref 7–25)
CALCIUM: 9.5 mg/dL (ref 8.6–10.4)
CO2: 25 mmol/L (ref 20–31)
Chloride: 103 mmol/L (ref 98–110)
Creat: 0.8 mg/dL (ref 0.50–1.05)
GLUCOSE: 153 mg/dL — AB (ref 65–99)
POTASSIUM: 4.2 mmol/L (ref 3.5–5.3)
Sodium: 138 mmol/L (ref 135–146)
Total Bilirubin: 0.5 mg/dL (ref 0.2–1.2)
Total Protein: 6.8 g/dL (ref 6.1–8.1)

## 2016-06-15 LAB — HEMOGLOBIN A1C
Hgb A1c MFr Bld: 8.2 % — ABNORMAL HIGH (ref <5.7)
MEAN PLASMA GLUCOSE: 189 mg/dL

## 2016-06-18 ENCOUNTER — Ambulatory Visit (INDEPENDENT_AMBULATORY_CARE_PROVIDER_SITE_OTHER): Payer: Medicaid Other | Admitting: "Endocrinology

## 2016-06-18 ENCOUNTER — Encounter: Payer: Self-pay | Admitting: "Endocrinology

## 2016-06-18 VITALS — BP 145/86 | HR 101 | Ht 65.0 in | Wt 236.0 lb

## 2016-06-18 DIAGNOSIS — E7800 Pure hypercholesterolemia, unspecified: Secondary | ICD-10-CM

## 2016-06-18 DIAGNOSIS — E1165 Type 2 diabetes mellitus with hyperglycemia: Secondary | ICD-10-CM | POA: Diagnosis not present

## 2016-06-18 DIAGNOSIS — E118 Type 2 diabetes mellitus with unspecified complications: Secondary | ICD-10-CM | POA: Diagnosis not present

## 2016-06-18 DIAGNOSIS — Z794 Long term (current) use of insulin: Secondary | ICD-10-CM | POA: Diagnosis not present

## 2016-06-18 DIAGNOSIS — IMO0002 Reserved for concepts with insufficient information to code with codable children: Secondary | ICD-10-CM

## 2016-06-18 DIAGNOSIS — I10 Essential (primary) hypertension: Secondary | ICD-10-CM | POA: Diagnosis not present

## 2016-06-18 MED ORDER — FLUCONAZOLE 150 MG PO TABS: 150.0000 mg | ORAL_TABLET | Freq: Once | ORAL | 0 refills | Status: AC

## 2016-06-18 NOTE — Progress Notes (Signed)
Subjective:    Patient ID: Sandy Valencia, female    DOB: July 04, 1960, PCP Alonza Bogus, MD   Past Medical History:  Diagnosis Date  . Arthritis   . Diabetes mellitus   . Fatty liver   . GERD (gastroesophageal reflux disease)   . Hypertension    Past Surgical History:  Procedure Laterality Date  . ABDOMINAL HYSTERECTOMY    . CHOLECYSTECTOMY    . COLONOSCOPY N/A 09/22/2012   RMR: colonic polyps -removed as described above. tubular adenoma, next TCS 09/2017  . ESOPHAGOGASTRODUODENOSCOPY N/A 10/21/2014   RMR: Small hiatal hernia; otherwise normal EGD status post passage of a Maloney dilator.   Marland Kitchen MALONEY DILATION N/A 10/21/2014   Procedure: [name] DILATION;  Surgeon: Daneil Dolin, MD;  Location: [facility];  Service: Endoscopy;  Laterality: N/A;  . MIDDLE EAR SURGERY     Social History   Social History  . Marital status: Divorced    Spouse name: N/A  . Number of children: N/A  . Years of education: N/A   Social History Main Topics  . Smoking status: Never Smoker  . Smokeless tobacco: Never Used  . Alcohol use No  . Drug use: No  . Sexual activity: Yes    Birth control/ protection: Surgical   Other Topics Concern  . None   Social History Narrative  . None   Outpatient Encounter Prescriptions as of 06/18/2016  Medication Sig  . aspirin EC 81 MG tablet Take 81 mg by mouth daily.  . beclomethasone (QVAR) 80 MCG/ACT inhaler Inhale 2 puffs into the lungs 2 (two) times daily.  . Cholecalciferol (VITAMIN D-3) 1000 UNITS CAPS Take 1 capsule by mouth daily.  . diphenhydrAMINE (BENADRYL) 25 MG tablet Take 1 tablet (25 mg total) by mouth every 6 (six) hours.  . famotidine (PEPCID) 20 MG tablet Take 1 tablet (20 mg total) by mouth 2 (two) times daily.  . fluconazole (DIFLUCAN) 150 MG tablet Take 1 tablet (150 mg total) by mouth once.  Marland Kitchen glucose blood (ACCU-CHEK COMPACT PLUS) test strip Use as instructed  . HYDROcodone-acetaminophen (NORCO) 7.5-325 MG per tablet Take 1  tablet by mouth every 6 (six) hours as needed for moderate pain.   Marland Kitchen insulin aspart (NOVOLOG FLEXPEN) 100 UNIT/ML FlexPen Inject 16-22 Units into the skin 3 (three) times daily with meals.  . Insulin Pen Needle (B-D ULTRAFINE III SHORT PEN) 31G X 8 MM MISC 1 each by Does not apply route as directed.  . INVOKANA 100 MG TABS tablet TAKE ONE (1) TABLET EACH DAY  . Lancets MISC 1 each by Does not apply route as directed.  Marland Kitchen LANTUS SOLOSTAR 100 UNIT/ML Solostar Pen INJECT 60 UNITS SUBCUTANEOUSLY TWICE A DAY  . lisinopril-hydrochlorothiazide (PRINZIDE,ZESTORETIC) 20-25 MG tablet TAKE ONE (1) TABLET BY MOUTH EVERY DAY  . pantoprazole (PROTONIX) 40 MG tablet TAKE ONE (1) TABLET BY MOUTH EVERY DAY  . simvastatin (ZOCOR) 10 MG tablet Take 1 tablet (10 mg total) by mouth at bedtime.  . vitamin B-12 (CYANOCOBALAMIN) 100 MCG tablet Take 100 mcg by mouth daily.  . [DISCONTINUED] Insulin Aspart (NOVOLOG FLEXPEN [place]) Inject 14-20 Units into the skin 3 (three) times daily with meals.  . [DISCONTINUED] NOVOLOG FLEXPEN 100 UNIT/ML FlexPen INJECT 25 UNITS S.Q. THREE TIMES DAILY WITH MEALS   No facility-administered encounter medications on file as of 06/18/2016.    ALLERGIES: No Known Allergies VACCINATION STATUS: There is no immunization history for the selected administration types on file for this  patient.  Diabetes  She presents for her follow-up diabetic visit. She has type 2 diabetes mellitus. Her disease course has been improving (She was diagnosed at approximate age of 33 years.). There are no hypoglycemic associated symptoms. Pertinent negatives for hypoglycemia include no confusion, headaches, pallor or seizures. There are no diabetic associated symptoms. Pertinent negatives for diabetes include no chest pain, no polydipsia, no polyphagia and no polyuria. There are no hypoglycemic complications. Symptoms are improving. There are no diabetic complications. (She has history of noncompliance.) Risk factors for  coronary artery disease include dyslipidemia, diabetes mellitus, hypertension, sedentary lifestyle and tobacco exposure. Current diabetic treatment includes insulin injections (She only administered 50% of prescribed insulin.). She is compliant with treatment some of the time. Her weight is stable. She is following a generally unhealthy diet. She never participates in exercise. Her home blood glucose trend is decreasing rapidly. Her breakfast blood glucose range is generally 140-180 mg/dl. Her lunch blood glucose range is generally 140-180 mg/dl. Her dinner blood glucose range is generally 140-180 mg/dl. Her overall blood glucose range is 140-180 mg/dl. An ACE inhibitor/angiotensin II receptor blocker is being taken.  Hyperlipidemia  This is a chronic problem. The current episode started more than 1 year ago. The problem is uncontrolled. Pertinent negatives include no chest pain, myalgias or shortness of breath. Current antihyperlipidemic treatment includes statins. Risk factors for coronary artery disease include diabetes mellitus, dyslipidemia, hypertension, obesity and a sedentary lifestyle.  Hypertension  This is a chronic problem. The current episode started more than 1 year ago. The problem is uncontrolled. Pertinent negatives include no chest pain, headaches, palpitations or shortness of breath. Risk factors for coronary artery disease include dyslipidemia, diabetes mellitus, obesity, sedentary lifestyle and smoking/tobacco exposure. Past treatments include nothing.     Review of Systems  Constitutional: Negative for unexpected weight change.  HENT: Negative for trouble swallowing and voice change.   Eyes: Negative for visual disturbance.  Respiratory: Negative for cough, shortness of breath and wheezing.   Cardiovascular: Negative for chest pain, palpitations and leg swelling.  Gastrointestinal: Negative for diarrhea, nausea and vomiting.  Endocrine: Negative for cold intolerance, heat  intolerance, polydipsia, polyphagia and polyuria.  Musculoskeletal: Negative for arthralgias and myalgias.  Skin: Negative for color change, pallor, rash and wound.  Neurological: Negative for seizures and headaches.  Psychiatric/Behavioral: Negative for confusion and suicidal ideas.    Objective:    BP (!) 145/86   Pulse (!) 101   Ht 5\' 5"  (1.651 m)   Wt 236 lb (107 kg)   BMI 39.27 kg/m   Wt Readings from Last 3 Encounters:  06/18/16 236 lb (107 kg)  03/12/16 235 lb (106.6 kg)  12/13/15 239 lb (108.4 kg)    Physical Exam  Constitutional: She is oriented to person, place, and time. She appears well-developed.  HENT:  Head: Normocephalic and atraumatic.  Eyes: EOM are normal.  Neck: Normal range of motion. Neck supple. No tracheal deviation present. No thyromegaly present.  Cardiovascular: Normal rate and regular rhythm.   Pulmonary/Chest: Effort normal and breath sounds normal.  Abdominal: Soft. Bowel sounds are normal. There is no tenderness. There is no guarding.  Musculoskeletal: Normal range of motion. She exhibits no edema.  Neurological: She is alert and oriented to person, place, and time. She has normal reflexes. No cranial nerve deficit. Coordination normal.  Skin: Skin is warm and dry. No rash noted. No erythema. No pallor.  Psychiatric: She has a normal mood and affect. Judgment normal.  Recent Results (from the past 2160 hour(s))  Comprehensive metabolic panel     Status: Abnormal   Collection Time: 06/15/16  9:44 AM  Result Value Ref Range   Sodium 138 135 - 146 mmol/L   Potassium 4.2 3.5 - 5.3 mmol/L   Chloride 103 98 - 110 mmol/L   CO2 25 20 - 31 mmol/L   Glucose, Bld 153 (H) 65 - 99 mg/dL   BUN 13 7 - 25 mg/dL   Creat 0.80 0.50 - 1.05 mg/dL    Comment:   For patients > or = 55 years of age: The upper reference limit for Creatinine is approximately 13% higher for people identified as African-American.      Total Bilirubin 0.5 0.2 - 1.2 mg/dL    Alkaline Phosphatase 67 33 - 130 U/L   AST 19 10 - 35 U/L   ALT 28 6 - 29 U/L   Total Protein 6.8 6.1 - 8.1 g/dL   Albumin 4.3 3.6 - 5.1 g/dL   Calcium 9.5 8.6 - 10.4 mg/dL  Hemoglobin A1c     Status: Abnormal   Collection Time: 06/15/16  9:44 AM  Result Value Ref Range   Hgb A1c MFr Bld 8.2 (H) <5.7 %    Comment:   For someone without known diabetes, a hemoglobin A1c value of 6.5% or greater indicates that they may have diabetes and this should be confirmed with a follow-up test.   For someone with known diabetes, a value <7% indicates that their diabetes is well controlled and a value greater than or equal to 7% indicates suboptimal control. A1c targets should be individualized based on duration of diabetes, age, comorbid conditions, and other considerations.   Currently, no consensus exists for use of hemoglobin A1c for diagnosis of diabetes for children.      Mean Plasma Glucose 189 mg/dL     Assessment & Plan:   1. Uncontrolled type 2 diabetes mellitus with complication, with long-term current use of insulin ([place])   -her  diabetes is  complicated by noncompliance and patient remains at a high risk for more acute and chronic complications of diabetes which include CAD, CVA, CKD, retinopathy, and neuropathy. These are all discussed in detail with the patient. - Her average blood glucose is 180. Recent labs reviewed.  Patient came with A1c of 8.2% , Slowly improving from 8.6%.  - I have re-counseled the patient on diet management and weight loss  by adopting a carbohydrate restricted / protein rich  Diet.  - Suggestion is made for patient to avoid simple carbohydrates   from their diet including Cakes , Desserts, Ice Cream,  Soda (  diet and regular) , Sweet Tea , Candies,  Chips, Cookies, Artificial Sweeteners,   and "Sugar-free" Products .  This will help patient to have stable blood glucose profile and potentially avoid unintended  Weight gain.  - Patient is advised to  stick to a routine mealtimes to eat 3 meals  a day and avoid unnecessary snacks ( to snack only to correct hypoglycemia).  - The patient  has been  scheduled with Jearld Fenton, RDN, CDE for individualized DM education.  - I have approached patient with the following individualized plan to manage diabetes and patient agrees.  - She is advised to continue Lantus 60 qhs,  Novolog  16 units TIDAC plus SSI. Pt to continue to monitor BG ac and hs.  - She is advised to keep her appointment with Jearld Fenton, CDE for  individualized DM education. I will continue Simvastatin 20mg  po qhs. -Continue Invokana 116mmg po qam. SE discussed again to her.  - Patient will be considered for incretin therapy as appropriate next visit. - Patient specific target  for A1c; LDL, HDL, Triglycerides, and  Waist Circumference were discussed in detail.  2) BP/HTN: Uncontrolled.   I have initiated hydrochlorothiazide/lisinopril 25/20 mg by mouth daily. I counseled her on salt restriction.   3) Lipids/HPL:  continue statins. 4)  Weight/Diet: CDE consult in progress, exercise, and carbohydrates information provided. - I discussed  bariatric surgery with her which would help her significantly improved her chance of controlling diabetes without insulin. Brochure and contact information given to her.   5. Personal history of noncompliance with medical treatment, presenting hazards to health -I counseled her on the need to stay focused and treat diabetes intensively to  avoid competitions.  6) Chronic Care/Health Maintenance:  -Patient is on ACEI/ARB and Statin medications and encouraged to continue to follow up with Ophthalmology, Podiatrist at least yearly or according to recommendations, and advised to  stay away from smoking. I have recommended yearly flu vaccine and pneumonia vaccination at least every 5 years; moderate intensity exercise for up to 150 minutes weekly; and  sleep for at least 7 hours a day.  - 25  minutes of time was spent on the care of this patient , 50% of which was applied for counseling on diabetes complications and their preventions.  - I advised patient to maintain close follow up with HAWKINS,EDWARD L, MD for primary care needs.  Patient is asked to bring meter and  blood glucose logs during their next visit.   Follow up plan: -Return in about 3 months (around 09/16/2016) for follow up with pre-visit labs, meter, and logs.  Glade Lloyd, MD Phone: [phone]  Fax: [phone]   06/18/2016, 10:23 AM

## 2016-06-18 NOTE — Patient Instructions (Signed)

## 2016-06-25 ENCOUNTER — Encounter: Payer: Self-pay | Admitting: Orthopedic Surgery

## 2016-06-25 ENCOUNTER — Ambulatory Visit (INDEPENDENT_AMBULATORY_CARE_PROVIDER_SITE_OTHER): Payer: Medicaid Other

## 2016-06-25 ENCOUNTER — Ambulatory Visit (INDEPENDENT_AMBULATORY_CARE_PROVIDER_SITE_OTHER): Payer: Medicaid Other | Admitting: Orthopedic Surgery

## 2016-06-25 VITALS — BP 141/87 | HR 92 | Wt 234.0 lb

## 2016-06-25 DIAGNOSIS — M19042 Primary osteoarthritis, left hand: Secondary | ICD-10-CM | POA: Diagnosis not present

## 2016-06-25 DIAGNOSIS — M8560 Other cyst of bone, unspecified site: Secondary | ICD-10-CM | POA: Diagnosis not present

## 2016-06-25 NOTE — Addendum Note (Signed)
Addended by: Baldomero Lamy B on: 06/25/2016 05:30 PM   Modules accepted: Orders, SmartSet

## 2016-06-25 NOTE — Progress Notes (Signed)
New problem  Chief complaint mass left ring finger   56 year old female with a painful cyst over the DIP joint of the left ring finger which causes her discomfort when she's doing activities such as putting her clothes on or if she bumps it up against any object.  Quality of pain is sharp when present and severe. Cyst is been forming over several years  Review of Systems  Constitutional: Negative for chills, fever and weight loss.  Respiratory: Negative for shortness of breath.   Cardiovascular: Negative for chest pain.  Neurological: Negative for tingling.    Past Medical History:  Diagnosis Date  . Arthritis   . Diabetes mellitus   . Fatty liver   . GERD (gastroesophageal reflux disease)   . Hypertension    BP (!) 141/87   Pulse 92   Wt 234 lb (106.1 kg)   BMI 38.94 kg/m   General appearance normal in appearance Oriented to person place and time Mood and affect normal Gait normal  Inspection reveals a nodule over the DIP joint of the left ring finger with no drainage but severely tender. DIP joint motion and stability remain normal. The flexor and extensor tendons are normal the skin is without rub or the capillary refill is normal and there is normal sensation in the digit  X-ray Arthritis of the DIP joint no fracture Impression arthritic nodule with characteristic cyst formation which is tender  Despite my recommendations to leave it alone the patient would like it excised with the understanding that it will probably come back  She would like to proceed with excision of the cyst from the left ring finger DIP joint dorsal surface

## 2016-06-26 DIAGNOSIS — I8390 Asymptomatic varicose veins of unspecified lower extremity: Secondary | ICD-10-CM | POA: Diagnosis not present

## 2016-07-03 ENCOUNTER — Encounter: Payer: Self-pay | Admitting: "Endocrinology

## 2016-07-12 NOTE — Patient Instructions (Signed)
Sandy Valencia  07/12/2016     @PREFPERIOPPHARMACY @   Your procedure is scheduled on  07/19/2016   Report to [facility] at  830  A.M.  Call this number if you have problems the morning of surgery:  [phone]   Remember:  Do not eat food or drink liquids after midnight.  Take these medicines the morning of surgery with A SIP OF WATER  Nexium, hydrocodone, lisinopril, Take your Q-Var before you come and bring a rescue inhaler with you, if you have one. Take 1/2 of your normal Insulin dosage the night before your surgery. DO NOT take any medication for diabetes the morning of surgery.   Do not wear jewelry, make-up or nail polish.  Do not wear lotions, powders, or perfumes, or deoderant.  Do not shave 48 hours prior to surgery.  Men may shave face and neck.  Do not bring valuables to the hospital.  [facility] is not responsible for any belongings or valuables.  Contacts, dentures or bridgework may not be worn into surgery.  Leave your suitcase in the car.  After surgery it may be brought to your room.  For patients admitted to the hospital, discharge time will be determined by your treatment team.  Patients discharged the day of surgery will not be allowed to drive home.   Name and phone number of your driver:   family Special instructions:  none  Please read over the following fact sheets that you were given. Anesthesia Post-op Instructions and Care and Recovery After Surgery      Excision of Skin Lesions Introduction Excision of a skin lesion refers to the removal of a section of skin by making small cuts (incisions) in the skin. This procedure may be done to remove a cancerous (malignant) or noncancerous (benign) growth on the skin. It is typically done to treat or prevent cancer or infection. It may also be done to improve cosmetic appearance. The procedure may be done to remove:  Cancerous growths, such as basal cell carcinoma, squamous cell carcinoma, or  melanoma.  Noncancerous growths, such as a cyst or lipoma.  Growths, such as moles or skin tags, which may be removed for cosmetic reasons. Various excision or surgical techniques may be used depending on your condition, the location of the lesion, and your overall health. Tell a health care provider about:  Any allergies you have.  All medicines you are taking, including vitamins, herbs, eye drops, creams, and over-the-counter medicines.  Any problems you or family members have had with anesthetic medicines.  Any blood disorders you have.  Any surgeries you have had.  Any medical conditions you have.  Whether you are pregnant or may be pregnant. What are the risks? Generally, this is a safe procedure. However, problems may occur, including:  Bleeding.  Infection.  Scarring.  Recurrence of the cyst, lipoma, or cancer.  Changes in skin sensation or appearance, such as discoloration or swelling.  Reaction to the anesthetics.  Allergic reaction to surgical materials or ointments.  Damage to nerves, blood vessels, muscles, or other structures.  Continued pain. What happens before the procedure?  Ask your health care provider about:  Changing or stopping your regular medicines. This is especially important if you are taking diabetes medicines or blood thinners.  Taking medicines such as aspirin and ibuprofen. These medicines can thin your blood. Do not take these medicines before your procedure if your health care provider instructs you  not to.  You may be asked to take certain medicines.  You may be asked to stop smoking.  You may have an exam or testing.  Plan to have someone take you home after the procedure.  Plan to have someone help you with activities during recovery. What happens during the procedure?  To reduce your risk of infection:  Your health care team will wash or sanitize their hands.  Your skin will be washed with soap.  You will be given  a medicine to numb the area (local anesthetic).  One of the following excision techniques will be performed.  At the end of any of these procedures, antibiotic ointment will be applied as needed. Each of the following techniques may vary among health care providers and hospitals. Complete Surgical Excision  The area of skin that needs to be removed will be marked with a pen. Using a small scalpel or scissors, the surgeon will gently cut around and under the lesion until it is completely removed. The lesion will be placed in a fluid and sent to the lab for examination. If necessary, bleeding will be controlled with a device that delivers heat (electrocautery). The edges of the wound may be stitched (sutured) together, and a bandage (dressing) will be applied. This procedure may be performed to treat a cancerous growth or a noncancerous cyst or lesion. Excision of a Cyst  The surgeon will make an incision on the cyst. The entire cyst will be removed through the incision. The incision may be closed with sutures. Shave Excision  During shave excision, the surgeon will use a small blade or an electrically heated loop instrument to shave off the lesion. This may be done to remove a mole or a skin tag. The wound will usually be left to heal on its own without sutures. Punch Excision  During punch excision, the surgeon will use a small tool that is like a cookie cutter or a hole punch to cut a circle shape out of the skin. The outer edges of the skin will be sutured together. This may be done to remove a mole or a scar or to perform a biopsy of the lesion. Mohs Micrographic Surgery  During Mohs micrographic surgery, layers of the lesion will be removed with a scalpel or a loop instrument and will be examined right away under a microscope. Layers will be removed until all of the abnormal or cancerous tissue has been removed. This procedure is minimally invasive, and it ensures the best cosmetic outcome. It  involves the removal of as little normal tissue as possible. Mohs is usually done to treat skin cancer, such as basal cell carcinoma or squamous cell carcinoma, particularly on the face and ears. Depending on the size of the surgical wound, it may be sutured closed. What happens after the procedure?  Return to your normal activities as told by your health care provider.  Talk with your health care provider to discuss any test results, treatment options, and if necessary, the need for more tests. This information is not intended to replace advice given to you by your health care provider. Make sure you discuss any questions you have with your health care provider. Document Released: 08/15/2009 Document Revised: 10/27/2015 Document Reviewed: 07/07/2014  2017 Elsevier Excision of Skin Lesions, Care After Introduction Refer to this sheet in the next few weeks. These instructions provide you with information about caring for yourself after your procedure. Your health care provider may also give you more specific  instructions. Your treatment has been planned according to current medical practices, but problems sometimes occur. Call your health care provider if you have any problems or questions after your procedure. What can I expect after the procedure? After your procedure, it is common to have pain or discomfort at the excision site. Follow these instructions at home:  Take over-the-counter and prescription medicines only as told by your health care provider.  Follow instructions from your health care provider about:  How to take care of your excision site. You should keep the site clean, dry, and protected for at least 48 hours.  When and how you should change your bandage (dressing).  When you should remove your dressing.  Removing whatever was used to close your excision site.  Check the excision area every day for signs of infection. Watch for:  Redness, swelling, or pain.  Fluid,  blood, or pus.  For bleeding, apply gentle but firm pressure to the area using a folded towel for 20 minutes.  Avoid high-impact exercise and activities until the stitches (sutures) are removed or the area heals.  Follow instructions from your health care provider about how to minimize scarring. Avoid sun exposure until the area has healed. Scarring should lessen over time.  Keep all follow-up visits as told by your health care provider. This is important. Contact a health care provider if:  You have a fever.  You have redness, swelling, or pain at the excision site.  You have fluid, blood, or pus coming from the excision site.  You have ongoing bleeding at the excision site.  You have pain that does not improve in 2-3 days after your procedure.  You notice skin irregularities or changes in sensation. This information is not intended to replace advice given to you by your health care provider. Make sure you discuss any questions you have with your health care provider. Document Released: 10/05/2014 Document Revised: 10/27/2015 Document Reviewed: 07/07/2014  2017 Else[employer] PATIENT INSTRUCTIONS POST-ANESTHESIA  IMMEDIATELY FOLLOWING SURGERY:  Do not drive or operate machinery for the first twenty four hours after surgery.  Do not make any important decisions for twenty four hours after surgery or while taking narcotic pain medications or sedatives.  If you develop intractable nausea and vomiting or a severe headache please notify your doctor immediately.  FOLLOW-UP:  Please make an appointment with your surgeon as instructed. You do not need to follow up with anesthesia unless specifically instructed to do so.  WOUND CARE INSTRUCTIONS (if applicable):  Keep a dry clean dressing on the anesthesia/puncture wound site if there is drainage.  Once the wound has quit draining you may leave it open to air.  Generally you should leave the bandage intact for twenty four hours unless there is  drainage.  If the epidural site drains for more than 36-48 hours please call the anesthesia department.  QUESTIONS?:  Please feel free to call your physician or the hospital operator if you have any questions, and they will be happy to assist you.

## 2016-07-16 ENCOUNTER — Inpatient Hospital Stay (HOSPITAL_COMMUNITY)
Admission: RE | Admit: 2016-07-16 | Discharge: 2016-07-16 | Disposition: A | Discharge status: Home / Self Care | Payer: Medicaid Other | Source: Ambulatory Visit

## 2016-07-16 ENCOUNTER — Encounter (HOSPITAL_COMMUNITY)
Admission: RE | Admit: 2016-07-16 | Discharge: 2016-07-16 | Disposition: A | Discharge status: Home / Self Care | Payer: Medicaid Other | Source: Ambulatory Visit | Attending: Orthopedic Surgery | Admitting: Orthopedic Surgery

## 2016-07-16 ENCOUNTER — Encounter (HOSPITAL_COMMUNITY): Payer: Self-pay

## 2016-07-16 DIAGNOSIS — Z01818 Encounter for other preprocedural examination: Secondary | ICD-10-CM | POA: Insufficient documentation

## 2016-07-16 DIAGNOSIS — Z01810 Encounter for preprocedural cardiovascular examination: Secondary | ICD-10-CM | POA: Insufficient documentation

## 2016-07-16 HISTORY — DX: Unspecified asthma, uncomplicated: J45.909

## 2016-07-16 HISTORY — DX: Sleep apnea, unspecified: G47.30

## 2016-07-16 LAB — BASIC METABOLIC PANEL
Anion gap: 7 (ref 5–15)
BUN: 22 mg/dL — ABNORMAL HIGH (ref 6–20)
CALCIUM: 9.4 mg/dL (ref 8.9–10.3)
CO2: 25 mmol/L (ref 22–32)
CREATININE: 0.63 mg/dL (ref 0.44–1.00)
Chloride: 105 mmol/L (ref 101–111)
GFR calc non Af Amer: >60 mL/min (ref >60)
Glucose, Bld: 177 mg/dL — ABNORMAL HIGH (ref 65–99)
Potassium: 3.8 mmol/L (ref 3.5–5.1)
SODIUM: 137 mmol/L (ref 135–145)

## 2016-07-16 LAB — CBC WITH DIFFERENTIAL/PLATELET
BASOS PCT: 0 %
Basophils Absolute: 0 10*3/uL (ref 0.0–0.1)
EOS ABS: 0.1 10*3/uL (ref 0.0–0.7)
Eosinophils Relative: 2 %
HEMATOCRIT: 44.3 % (ref 36.0–46.0)
Hemoglobin: 14.4 g/dL (ref 12.0–15.0)
Lymphocytes Relative: 30 %
Lymphs Abs: 1.4 10*3/uL (ref 0.7–4.0)
MCH: 28.6 pg (ref 26.0–34.0)
MCHC: 32.5 g/dL (ref 30.0–36.0)
MCV: 87.9 fL (ref 78.0–100.0)
MONO ABS: 0.3 10*3/uL (ref 0.1–1.0)
MONOS PCT: 7 %
Neutro Abs: 2.9 10*3/uL (ref 1.7–7.7)
Neutrophils Relative %: 61 %
Platelets: 228 10*3/uL (ref 150–400)
RBC: 5.04 MIL/uL (ref 3.87–5.11)
RDW: 14.5 % (ref 11.5–15.5)
WBC: 4.8 10*3/uL (ref 4.0–10.5)

## 2016-07-18 NOTE — H&P (Signed)
HISTORY AND PHYSICAL  For Surgery    Chief complaint mass left ring finger     56 year old female with a painful cyst over the DIP joint of the left ring finger which causes her discomfort when she's doing activities such as putting her clothes on or if she bumps it up against any object.   Quality of pain is sharp when present and severe. Cyst is been forming over several years   Review of Systems  All other systems reviewed and are negative.   Review of Systems  Constitutional: Negative for chills, fever and weight loss.  Respiratory: Negative for shortness of breath.   Cardiovascular: Negative for chest pain.  Neurological: Negative for tingling.    Past Surgical History:  Procedure Laterality Date  . ABDOMINAL HYSTERECTOMY    . CHOLECYSTECTOMY    . COLONOSCOPY N/A 09/22/2012   RMR: colonic polyps -removed as described above. tubular adenoma, next TCS 09/2017  . CYST REMOVAL LEG Right    foot  . ESOPHAGOGASTRODUODENOSCOPY N/A 10/21/2014   RMR: Small hiatal hernia; otherwise normal EGD status post passage of a Maloney dilator.   Marland Kitchen MALONEY DILATION N/A 10/21/2014   Procedure: [name] DILATION;  Surgeon: Daneil Dolin, MD;  Location: [facility];  Service: Endoscopy;  Laterality: N/A;  . MIDDLE EAR SURGERY Right    patched hole in ear drum   Family History  Problem Relation Age of Onset  . Colon cancer Mother     diagnosed age 51, underwent surgical resection, metastatic disease several years later  . Diabetes Mother   . Heart attack Father   . Diabetes Sister   . Hypothyroidism Brother   . Diabetes Sister   . Diabetes Sister   . Diabetes Sister   . Hypothyroidism Sister    Social History  Substance Use Topics  . Smoking status: Never Smoker  . Smokeless tobacco: Never Used  . Alcohol use No   No current facility-administered medications for this encounter.   Current Outpatient Prescriptions:  .  aspirin EC 81 MG tablet, Take 81 mg by mouth daily., Disp: , Rfl:   .  beclomethasone (QVAR) 80 MCG/ACT inhaler, Inhale 2 puffs into the lungs 2 (two) times daily., Disp: , Rfl:  .  Cholecalciferol (VITAMIN D-3) 1000 UNITS CAPS, Take 1 capsule by mouth daily., Disp: , Rfl:  .  diphenhydrAMINE (BENADRYL) 25 MG tablet, Take 1 tablet (25 mg total) by mouth every 6 (six) hours. (Patient taking differently: Take 25 mg by mouth 2 (two) times daily as needed (ALLERGIC REACTIONS). ), Disp: 20 tablet, Rfl: 0 .  esomeprazole (NEXIUM) 40 MG packet, Take 40 mg by mouth daily before breakfast., Disp: , Rfl:  .  HYDROcodone-acetaminophen (NORCO) 7.5-325 MG per tablet, Take 1 tablet by mouth every 6 (six) hours as needed for moderate pain. , Disp: , Rfl:  .  insulin aspart (NOVOLOG FLEXPEN) 100 UNIT/ML FlexPen, Inject 16-22 Units into the skin 3 (three) times daily with meals. Per sliding scale, Disp: , Rfl:  .  INVOKANA 100 MG TABS tablet, TAKE ONE (1) TABLET EACH DAY, Disp: 30 tablet, Rfl: 3 .  LANTUS SOLOSTAR 100 UNIT/ML Solostar Pen, INJECT 60 UNITS SUBCUTANEOUSLY TWICE A DAY (Patient taking differently: INJECT 60 UNITS SUBCUTANEOUSLY AT BEDTIME), Disp: 30 mL, Rfl: 2 .  lisinopril-hydrochlorothiazide (PRINZIDE,ZESTORETIC) 20-25 MG tablet, TAKE ONE (1) TABLET BY MOUTH EVERY DAY, Disp: 30 tablet, Rfl: 2 .  Melatonin 5 MG TABS, Take 5 mg by mouth at bedtime., Disp: ,  Rfl:  .  simvastatin (ZOCOR) 10 MG tablet, Take 1 tablet (10 mg total) by mouth at bedtime., Disp: 90 tablet, Rfl: 3 .  vitamin B-12 (CYANOCOBALAMIN) 500 MCG tablet, Take 500 mcg by mouth daily., Disp: , Rfl:  .  famotidine (PEPCID) 20 MG tablet, Take 1 tablet (20 mg total) by mouth 2 (two) times daily. (Patient not taking: Reported on 06/25/2016), Disp: 30 tablet, Rfl: 0 .  glucose blood (ACCU-CHEK COMPACT PLUS) test strip, Use as instructed, Disp: 150 each, Rfl: 3 .  Insulin Pen Needle (B-D ULTRAFINE III SHORT PEN) 31G X 8 MM MISC, 1 each by Does not apply route as directed., Disp: 150 each, Rfl: 3 .  Lancets MISC,  1 each by Does not apply route as directed., Disp: 150 each, Rfl: 3 .  pantoprazole (PROTONIX) 40 MG tablet, TAKE ONE (1) TABLET BY MOUTH EVERY DAY (Patient not taking: Reported on 07/11/2016), Disp: 30 tablet, Rfl: 11       Past Medical History:  Diagnosis Date  . Arthritis    . Diabetes mellitus    . Fatty liver    . GERD (gastroesophageal reflux disease)    . Hypertension      BP (!) 141/87   Pulse 92   Wt 234 lb (106.1 kg)   BMI 38.94 kg/m    General appearance normal in appearance Oriented to person place and time Mood and affect normal Gait normal   Inspection reveals a nodule over the DIP joint of the left ring finger with no drainage but severely tender. DIP joint motion and stability remain normal. The flexor and extensor tendons are normal the skin is without rub or the capillary refill is normal and there is normal sensation in the digit   X-ray Arthritis of the DIP joint no fracture Impression arthritic nodule with characteristic cyst formation which is tender   Despite my recommendations to leave it alone the patient would like it excised with the understanding that it will probably come back   She would like to proceed with excision of the cyst from the left ring finger DIP joint dorsal surface

## 2016-07-19 ENCOUNTER — Ambulatory Visit (HOSPITAL_COMMUNITY)
Admission: RE | Admit: 2016-07-19 | Discharge: 2016-07-19 | Disposition: A | Discharge status: Home / Self Care | Payer: Medicaid Other | Source: Ambulatory Visit | Attending: Orthopedic Surgery | Admitting: Orthopedic Surgery

## 2016-07-19 ENCOUNTER — Encounter (HOSPITAL_COMMUNITY): Payer: Self-pay | Admitting: *Deleted

## 2016-07-19 ENCOUNTER — Ambulatory Visit (HOSPITAL_COMMUNITY): Payer: Medicaid Other | Admitting: Anesthesiology

## 2016-07-19 ENCOUNTER — Encounter (HOSPITAL_COMMUNITY)
Admission: RE | Disposition: A | Discharge status: Home / Self Care | Payer: Self-pay | Source: Ambulatory Visit | Attending: Orthopedic Surgery

## 2016-07-19 DIAGNOSIS — E119 Type 2 diabetes mellitus without complications: Secondary | ICD-10-CM | POA: Diagnosis not present

## 2016-07-19 DIAGNOSIS — K219 Gastro-esophageal reflux disease without esophagitis: Secondary | ICD-10-CM | POA: Insufficient documentation

## 2016-07-19 DIAGNOSIS — Z7982 Long term (current) use of aspirin: Secondary | ICD-10-CM | POA: Diagnosis not present

## 2016-07-19 DIAGNOSIS — Z79899 Other long term (current) drug therapy: Secondary | ICD-10-CM | POA: Insufficient documentation

## 2016-07-19 DIAGNOSIS — E669 Obesity, unspecified: Secondary | ICD-10-CM | POA: Insufficient documentation

## 2016-07-19 DIAGNOSIS — Z794 Long term (current) use of insulin: Secondary | ICD-10-CM | POA: Insufficient documentation

## 2016-07-19 DIAGNOSIS — L729 Follicular cyst of the skin and subcutaneous tissue, unspecified: Secondary | ICD-10-CM | POA: Diagnosis present

## 2016-07-19 DIAGNOSIS — J45909 Unspecified asthma, uncomplicated: Secondary | ICD-10-CM | POA: Diagnosis not present

## 2016-07-19 DIAGNOSIS — M67442 Ganglion, left hand: Secondary | ICD-10-CM | POA: Insufficient documentation

## 2016-07-19 DIAGNOSIS — Z6838 Body mass index (BMI) 38.0-38.9, adult: Secondary | ICD-10-CM | POA: Diagnosis not present

## 2016-07-19 DIAGNOSIS — M67441 Ganglion, right hand: Secondary | ICD-10-CM

## 2016-07-19 DIAGNOSIS — Z7951 Long term (current) use of inhaled steroids: Secondary | ICD-10-CM | POA: Diagnosis not present

## 2016-07-19 DIAGNOSIS — I10 Essential (primary) hypertension: Secondary | ICD-10-CM | POA: Diagnosis not present

## 2016-07-19 HISTORY — PX: CYST EXCISION: SHX5701

## 2016-07-19 LAB — GLUCOSE, CAPILLARY
GLUCOSE-CAPILLARY: 145 mg/dL — AB (ref 65–99)
Glucose-Capillary: 167 mg/dL — ABNORMAL HIGH (ref 65–99)

## 2016-07-19 SURGERY — CYST REMOVAL
Anesthesia: Regional | Site: Finger | Laterality: Left

## 2016-07-19 MED ORDER — LACTATED RINGERS IV SOLN
INTRAVENOUS | Status: DC | PRN
  Administered 2016-07-19: 09:00:00 via INTRAVENOUS

## 2016-07-19 MED ORDER — MIDAZOLAM HCL 2 MG/2ML IJ SOLN
INTRAMUSCULAR | Status: AC
  Filled 2016-07-19: qty 2

## 2016-07-19 MED ORDER — BUPIVACAINE HCL (PF) 0.5 % IJ SOLN
INTRAMUSCULAR | Status: DC | PRN
  Administered 2016-07-19: 10 mL

## 2016-07-19 MED ORDER — SODIUM CHLORIDE 0.9 % IR SOLN
Status: DC | PRN
  Administered 2016-07-19: 1000 mL

## 2016-07-19 MED ORDER — CEFAZOLIN SODIUM-DEXTROSE 2-4 GM/100ML-% IV SOLN
2.0000 g | INTRAVENOUS | Status: AC
  Administered 2016-07-19: 2 g via INTRAVENOUS
  Filled 2016-07-19: qty 100

## 2016-07-19 MED ORDER — FENTANYL CITRATE (PF) 100 MCG/2ML IJ SOLN
INTRAMUSCULAR | Status: AC
  Filled 2016-07-19: qty 2

## 2016-07-19 MED ORDER — BUPIVACAINE HCL (PF) 0.5 % IJ SOLN
INTRAMUSCULAR | Status: AC
  Filled 2016-07-19: qty 30

## 2016-07-19 MED ORDER — MIDAZOLAM HCL 2 MG/2ML IJ SOLN
1.0000 mg | INTRAMUSCULAR | Status: AC
  Administered 2016-07-19 (×2): 2 mg via INTRAVENOUS
  Filled 2016-07-19: qty 2

## 2016-07-19 MED ORDER — ARTIFICIAL TEARS OP OINT
TOPICAL_OINTMENT | OPHTHALMIC | Status: AC
  Filled 2016-07-19: qty 3.5

## 2016-07-19 MED ORDER — LIDOCAINE HCL (PF) 0.5 % IJ SOLN
INTRAMUSCULAR | Status: AC
  Filled 2016-07-19: qty 50

## 2016-07-19 MED ORDER — HYDROCODONE-ACETAMINOPHEN 7.5-325 MG PO TABS: 1.0000 | ORAL_TABLET | Freq: Four times a day (QID) | ORAL | 0 refills | Status: DC | PRN

## 2016-07-19 MED ORDER — FENTANYL CITRATE (PF) 100 MCG/2ML IJ SOLN: 25.0000 ug | INTRAMUSCULAR | Status: DC | PRN

## 2016-07-19 MED ORDER — PROPOFOL 500 MG/50ML IV EMUL
INTRAVENOUS | Status: DC | PRN
  Administered 2016-07-19: 50 ug/kg/min via INTRAVENOUS

## 2016-07-19 MED ORDER — LACTATED RINGERS IV SOLN
INTRAVENOUS | Status: DC
  Administered 2016-07-19: 09:00:00 via INTRAVENOUS

## 2016-07-19 MED ORDER — PROPOFOL 10 MG/ML IV BOLUS
INTRAVENOUS | Status: AC
  Filled 2016-07-19: qty 20

## 2016-07-19 MED ORDER — FENTANYL CITRATE (PF) 100 MCG/2ML IJ SOLN
25.0000 ug | INTRAMUSCULAR | Status: AC
  Administered 2016-07-19 (×2): 25 ug via INTRAVENOUS

## 2016-07-19 MED ORDER — CHLORHEXIDINE GLUCONATE 4 % EX LIQD: 60.0000 mL | Freq: Once | CUTANEOUS | Status: DC

## 2016-07-19 MED ORDER — LIDOCAINE HCL (PF) 0.5 % IJ SOLN
INTRAMUSCULAR | Status: DC | PRN
  Administered 2016-07-19: 50 mL via INTRAVENOUS

## 2016-07-19 MED ORDER — PROPOFOL 10 MG/ML IV BOLUS
INTRAVENOUS | Status: DC | PRN
  Administered 2016-07-19: 10 mg via INTRAVENOUS

## 2016-07-19 SURGICAL SUPPLY — 43 items
BAG HAMPER (MISCELLANEOUS) ×3 IMPLANT
BANDAGE ELASTIC 2 LF NS (GAUZE/BANDAGES/DRESSINGS) ×3 IMPLANT
BANDAGE ELASTIC 3 VELCRO ST LF (GAUZE/BANDAGES/DRESSINGS) ×3 IMPLANT
BANDAGE ESMARK 4X12 BL STRL LF (DISPOSABLE) IMPLANT
BLADE SURG 15 STRL LF DISP TIS (BLADE) ×1 IMPLANT
BLADE SURG 15 STRL SS (BLADE) ×2
BNDG CONFORM 2 STRL LF (GAUZE/BANDAGES/DRESSINGS) ×3 IMPLANT
BNDG ESMARK 4X12 BLUE STRL LF (DISPOSABLE)
BNDG GAUZE ELAST 4 BULKY (GAUZE/BANDAGES/DRESSINGS) ×3 IMPLANT
CHLORAPREP W/TINT 26ML (MISCELLANEOUS) ×3 IMPLANT
CLOTH BEACON ORANGE TIMEOUT ST (SAFETY) ×3 IMPLANT
COVER LIGHT HANDLE STERIS (MISCELLANEOUS) ×12 IMPLANT
CUFF TOURNIQUET SINGLE 18IN (TOURNIQUET CUFF) ×3 IMPLANT
DECANTER SPIKE VIAL GLASS SM (MISCELLANEOUS) ×3 IMPLANT
DRSG XEROFORM 1X8 (GAUZE/BANDAGES/DRESSINGS) ×3 IMPLANT
ELECT NEEDLE TIP 2.8 STRL (NEEDLE) ×3 IMPLANT
ELECT REM PT RETURN 9FT ADLT (ELECTROSURGICAL) ×3
ELECTRODE REM PT RTRN 9FT ADLT (ELECTROSURGICAL) ×1 IMPLANT
FORMALIN 10 PREFIL 120ML (MISCELLANEOUS) ×3 IMPLANT
GAUZE SPONGE 4X4 12PLY STRL (GAUZE/BANDAGES/DRESSINGS) ×3 IMPLANT
GLOVE BIOGEL PI IND STRL 7.0 (GLOVE) ×1 IMPLANT
GLOVE BIOGEL PI INDICATOR 7.0 (GLOVE) ×2
GLOVE ECLIPSE 6.5 STRL STRAW (GLOVE) ×3 IMPLANT
GLOVE EXAM NITRILE XS STR PU (GLOVE) ×3 IMPLANT
GLOVE SKINSENSE NS SZ8.0 LF (GLOVE) ×2
GLOVE SKINSENSE STRL SZ8.0 LF (GLOVE) ×1 IMPLANT
GLOVE SS N UNI LF 8.5 STRL (GLOVE) ×3 IMPLANT
GOWN STRL REUS W/TWL LRG LVL3 (GOWN DISPOSABLE) ×6 IMPLANT
GOWN STRL REUS W/TWL XL LVL3 (GOWN DISPOSABLE) ×3 IMPLANT
HAND ALUMI XLG (SOFTGOODS) ×3 IMPLANT
KIT ROOM TURNOVER APOR (KITS) ×3 IMPLANT
LIQUID BAND (GAUZE/BANDAGES/DRESSINGS) IMPLANT
MANIFOLD NEPTUNE II (INSTRUMENTS) ×3 IMPLANT
NEEDLE HYPO 21X1.5 SAFETY (NEEDLE) ×3 IMPLANT
NS IRRIG 1000ML POUR BTL (IV SOLUTION) ×3 IMPLANT
PACK BASIC LIMB (CUSTOM PROCEDURE TRAY) ×3 IMPLANT
PAD ARMBOARD 7.5X6 YLW CONV (MISCELLANEOUS) ×3 IMPLANT
SET BASIN LINEN APH (SET/KITS/TRAYS/PACK) ×3 IMPLANT
SUT ETHILON 3 0 FSL (SUTURE) IMPLANT
SUT MON AB 2-0 SH 27 (SUTURE)
SUT MON AB 2-0 SH27 (SUTURE) IMPLANT
SUT PROLENE 3 0 PS 2 (SUTURE) IMPLANT
SYR CONTROL 10ML LL (SYRINGE) ×3 IMPLANT

## 2016-07-19 NOTE — Anesthesia Postprocedure Evaluation (Signed)
Anesthesia Post Note  Patient: Sandy Valencia  Procedure(s) Performed: Procedure(s) (LRB): CYST REMOVAL LEFT RING FINGER (Left)  Patient location during evaluation: PACU Anesthesia Type: Bier Block Level of consciousness: awake and alert and oriented Pain management: pain level controlled Vital Signs Assessment: post-procedure vital signs reviewed and stable Respiratory status: spontaneous breathing and patient connected to nasal cannula oxygen Cardiovascular status: stable Postop Assessment: no signs of nausea or vomiting Anesthetic complications: no     Last Vitals:  Vitals:   07/19/16 0945 07/19/16 1000  BP: (!) 136/95 140/82  Resp: 13 13  Temp:      Last Pain:  Vitals:   07/19/16 0828  TempSrc: Oral                 ADAMS, AMY A

## 2016-07-19 NOTE — Anesthesia Procedure Notes (Signed)
Procedure Name: MAC Date/Time: 07/19/2016 10:14 AM Performed by: Andree Elk, Yvonne Petite A Pre-anesthesia Checklist: Patient identified, Timeout performed, Emergency Drugs available and Suction available Oxygen Delivery Method: Simple face mask

## 2016-07-19 NOTE — Op Note (Signed)
07/19/2016  10:52 AM  PATIENT:  Sandy Valencia  56 y.o. female  PRE-OPERATIVE DIAGNOSIS:  CYST LEFT RING FINGER  POST-OPERATIVE DIAGNOSIS:  CYST LEFT RING FINGER  PROCEDURE:  Procedure(s): CYST REMOVAL LEFT RING FINGER (Left)  Operative findings on dorsal cyst over the DIP joint consistent with an arthritic synovial cyst  Details of procedure  The patient was identified in the preop area the surgical site was marked as left ring finger. She was taken to surgery a regional block was performed with a Bier block technique  The arm was prepped and draped including the hand sterilely.  Timeout was completed  The incision was made transverse and then curvilinear around the mass which was excised. Synovial fluid was noted. There was a dorsal spur on the distal phalanx and middle phalanx  Thorough irrigation was performed the tissue was sent for specimen and 4 3-0 nylon sutures in interrupted fashion were used to close the skin and 10 mL of plain Marcaine was injected as a digital block  Pressure was used on the finger once the tourniquet was released  Patient went to recovery room stable condition   SURGEON:  Surgeon(s) and Role:    * Carole Civil, MD - Primary  PHYSICIAN ASSISTANT:   ASSISTANTS: none   ANESTHESIA:   regional  EBL:  Total I/O In: -  Out: 1 [Blood:1]  BLOOD ADMINISTERED:none  DRAINS: none   LOCAL MEDICATIONS USED:  MARCAINE     SPECIMEN:  No Specimen  DISPOSITION OF SPECIMEN:  PATHOLOGY  COUNTS:  YES  TOURNIQUET:  * Missing tourniquet times found for documented tourniquets in log:  [id number] *  DICTATION: .[name] Dictation  PLAN OF CARE: Discharge to home after PACU  PATIENT DISPOSITION:  PACU - hemodynamically stable.   Delay start of Pharmacological VTE agent (>24hrs) due to surgical blood loss or risk of bleeding: not applicable

## 2016-07-19 NOTE — Anesthesia Procedure Notes (Signed)
Anesthesia Regional Block:  Bier block (IV Regional)  Pre-Anesthetic Checklist: ,, timeout performed, Correct Patient, Correct Site, Correct Laterality, Correct Procedure,, site marked, surgical consent,, at surgeon's request Needles:  Injection technique: Single-shot  Needle Type: Other      Needle Gauge: 24 and 24 G    Additional Needles: Bier block (IV Regional)  Nerve Stimulator or Paresthesia:   Additional Responses:  Pulse checked post tourniquet inflation. IV NSL discontinued post injection. Narrative:  Start time: 07/19/2016 10:25 AM  Performed by: Personally

## 2016-07-19 NOTE — Interval H&P Note (Signed)
History and Physical Interval Note:  07/19/2016 10:08 AM  Sandy Valencia  has presented today for surgery, with the diagnosis of CYST LEFT RING FINGER  The various methods of treatment have been discussed with the patient and family. After consideration of risks, benefits and other options for treatment, the patient has consented to  Procedure(s): CYST REMOVAL LEFT RING FINGER (Left) as a surgical intervention .  The patient's history has been reviewed, patient examined, no change in status, stable for surgery.  I have reviewed the patient's chart and labs.  Questions were answered to the patient's satisfaction.     Arther Abbott

## 2016-07-19 NOTE — Brief Op Note (Addendum)
07/19/2016  10:52 AM  PATIENT:  Sandy Valencia  56 y.o. female  PRE-OPERATIVE DIAGNOSIS:  CYST LEFT RING FINGER  POST-OPERATIVE DIAGNOSIS:  CYST LEFT RING FINGER  PROCEDURE:  Procedure(s): CYST REMOVAL LEFT RING FINGER (Left)  Operative findings on dorsal cyst over the DIP joint consistent with an arthritic synovial cyst  Details of procedure  The patient was identified in the preop area the surgical site was marked as left ring finger. She was taken to surgery a regional block was performed with a Bier block technique  The arm was prepped and draped including the hand sterilely.  Timeout was completed  The incision was made transverse and then curvilinear around the mass which was excised. Synovial fluid was noted. There was a dorsal spur on the distal phalanx and middle phalanx  Thorough irrigation was performed the tissue was sent for specimen and 4 3-0 nylon sutures in interrupted fashion were used to close the skin and 10 mL of plain Marcaine was injected as a digital block  Pressure was used on the finger once the tourniquet was released  Patient went to recovery room stable condition   SURGEON:  Surgeon(s) and Role:    * Carole Civil, MD - Primary  PHYSICIAN ASSISTANT:   ASSISTANTS: none   ANESTHESIA:   regional  EBL:  Total I/O In: -  Out: 1 [Blood:1]  BLOOD ADMINISTERED:none  DRAINS: none   LOCAL MEDICATIONS USED:  MARCAINE     SPECIMEN:  No Specimen  DISPOSITION OF SPECIMEN:  PATHOLOGY  COUNTS:  YES  TOURNIQUET:  * Missing tourniquet times found for documented tourniquets in log:  [id number] *  DICTATION: .[name] Dictation  PLAN OF CARE: Discharge to home after PACU  PATIENT DISPOSITION:  PACU - hemodynamically stable.   Delay start of Pharmacological VTE agent (>24hrs) due to surgical blood loss or risk of bleeding: not applicable

## 2016-07-19 NOTE — Discharge Instructions (Signed)
Sutured Wound Care Sutures are stitches that can be used to close wounds. Taking care of your wound properly can help prevent pain and infection. It can also help your wound to heal more quickly. How is this treated? Wound Care Keep the wound clean and dry. If you were given a bandage (dressing), change it at least one time per day or as told by your doctor. You should also change it if it gets wet or dirty. Keep the wound completely dry for the first 24 hours or as told by your doctor. After that time, you may shower or bathe. However, make sure that the wound is not soaked in water until the sutures have been removed. Clean the wound one time each day or as told by your doctor. Wash the wound with soap and water. Rinse the wound with water to remove all soap. Pat the wound dry with a clean towel. Do not rub the wound. After cleaning the wound, put a thin layer of antibiotic ointment on it as told your doctor. This ointment: Helps to prevent infection. Keeps the bandage from sticking to the wound. Have the sutures removed as told by your doctor. General Instructions Take or apply medicines only as told by your doctor. To help prevent scarring, make sure to cover your wound with sunscreen whenever you are outside after the sutures are removed and the wound is healed. Make sure to wear a sunscreen of at least 30 SPF. If you were prescribed an antibiotic medicine or ointment, finish all of it even if you start to feel better. Do not scratch or pick at the wound. Keep all follow-up visits as told by your doctor. This is important. Check your wound every day for signs of infection. Watch for: Redness, swelling, or pain. Fluid, blood, or pus. Raise (elevate) the injured area above the level of your heart while you are sitting or lying down, if possible. Avoid stretching your wound. Drink enough fluids to keep your pee (urine) clear or pale yellow. Contact a doctor if: You were given a tetanus  shot and you have any of these where the needle went in: Swelling. Very bad pain. Redness. Bleeding. You have a fever. A wound that was closed breaks open. You notice a bad smell coming from the wound. You notice something coming out of the wound, such as wood or glass. Medicine does not help your pain. You have any of these at the site of the wound. More redness. More swelling. More pain. You have any of these coming from the wound. Fluid. Blood. Pus. You notice a change in the color of your skin near the wound. You need to change the bandage often due to fluid, blood, or pus coming from the wound. You have a new rash. You have numbness around the wound. Get help right away if: You have very bad swelling around the wound. Your pain suddenly gets worse and is very bad. You have painful lumps near the wound or on skin that is anywhere on your body. You have a red streak going away from the wound. The wound is on your hand or foot and you cannot move a finger or toe like normal. The wound is on your hand or foot and you notice that your fingers or toes look pale or bluish. This information is not intended to replace advice given to you by your health care provider. Make sure you discuss any questions you have with your health care provider. Document Released:  11/07/2007 Document Revised: 10/27/2015 Document Reviewed: 12/31/2012 Elsevier Interactive Patient Education  2017 [place] your wound every day for signs of infection. Watch for:  Redness, swelling, or pain.  Fluid, blood, or pus.  Raise (elevate) the injured area above the level of your heart while you are sitting or lying down, if possible.  Avoid stretching your wound.  Drink enough fluids to keep your pee (urine) clear or pale yellow. Contact a doctor if:  You were given a tetanus shot and you have any of these where the needle went in:

## 2016-07-19 NOTE — Anesthesia Preprocedure Evaluation (Signed)
Anesthesia Evaluation  Patient identified by MRN, date of birth, ID band Patient awake    Reviewed: Allergy & Precautions, NPO status , Patient's Chart, lab work & pertinent test results  Airway Mallampati: II  TM Distance: >3 FB     Dental  (+) Teeth Intact   Pulmonary asthma , sleep apnea ,    breath sounds clear to auscultation       Cardiovascular hypertension, Pt. on medications  Rhythm:Regular Rate:Normal     Neuro/Psych    GI/Hepatic hiatal hernia, GERD  ,  Endo/Other  diabetes, Type 2Morbid obesity  Renal/GU      Musculoskeletal   Abdominal   Peds  Hematology   Anesthesia Other Findings   Reproductive/Obstetrics                             Anesthesia Physical Anesthesia Plan  ASA: III  Anesthesia Plan: Bier Block   Post-op Pain Management:    Induction: Intravenous  Airway Management Planned: Simple Face Mask  Additional Equipment:   Intra-op Plan:   Post-operative Plan:   Informed Consent: I have reviewed the patients History and Physical, chart, labs and discussed the procedure including the risks, benefits and alternatives for the proposed anesthesia with the patient or authorized representative who has indicated his/her understanding and acceptance.     Plan Discussed with:   Anesthesia Plan Comments:         Anesthesia Quick Evaluation

## 2016-07-19 NOTE — Transfer of Care (Signed)
Immediate Anesthesia Transfer of Care Note  Patient: Sandy Valencia  Procedure(s) Performed: Procedure(s): CYST REMOVAL LEFT RING FINGER (Left)  Patient Location: PACU  Anesthesia Type:Bier block  Level of Consciousness: awake, alert , oriented and patient cooperative  Airway & Oxygen Therapy: Patient Spontanous Breathing and Patient connected to nasal cannula oxygen  Post-op Assessment: Report given to RN and Post -op Vital signs reviewed and stable  Post vital signs: Reviewed and stable  Last Vitals:  Vitals:   07/19/16 0945 07/19/16 1000  BP: (!) 136/95 140/82  Resp: 13 13  Temp:      Last Pain:  Vitals:   07/19/16 0828  TempSrc: Oral      Patients Stated Pain Goal: 8 ([id number])  Complications: No apparent anesthesia complications

## 2016-07-20 ENCOUNTER — Encounter (HOSPITAL_COMMUNITY): Payer: Self-pay | Admitting: Orthopedic Surgery

## 2016-07-23 ENCOUNTER — Ambulatory Visit (INDEPENDENT_AMBULATORY_CARE_PROVIDER_SITE_OTHER): Payer: Medicaid Other | Admitting: Orthopedic Surgery

## 2016-07-23 ENCOUNTER — Encounter: Payer: Self-pay | Admitting: Orthopedic Surgery

## 2016-07-23 DIAGNOSIS — Z4889 Encounter for other specified surgical aftercare: Secondary | ICD-10-CM

## 2016-07-23 NOTE — Progress Notes (Signed)
Patient ID: Sandy Valencia, female   DOB: November 14, 1960, 56 y.o.   MRN: [id number]  Follow up visit/  postop visit #1  Chief Complaint  Patient presents with  . Follow-up    POST OP 1, LEFT RING FINGER CYST REMOVAL, DOS 07/19/16    Suture line looks clean dry and intact without erythema she does have some decreased motion at the DIP joint recommend suture removal postop day 11   Encounter Diagnosis  Name Primary?  Marland Kitchen Aftercare following surgery Yes      12:40 PM Arther Abbott, MD 07/23/2016

## 2016-07-30 ENCOUNTER — Ambulatory Visit (INDEPENDENT_AMBULATORY_CARE_PROVIDER_SITE_OTHER): Payer: Medicaid Other | Admitting: Orthopedic Surgery

## 2016-07-30 DIAGNOSIS — Z4889 Encounter for other specified surgical aftercare: Secondary | ICD-10-CM

## 2016-07-30 NOTE — Progress Notes (Signed)
  Postop visit #2 left ring finger cyst removal on February 15  This is postop day #11  Mild erythema no drainage  Mild stiffness DIP joint  Sutures were removed  Recommend soak in warm water and salt with a drop of discharge it for 20 minutes daily until redness goes away. Work on range of motion as instructed follow-up as needed

## 2016-07-30 NOTE — Patient Instructions (Signed)
Soak warm water salt and dish detergent   Daily for 20 min

## 2016-08-13 ENCOUNTER — Other Ambulatory Visit: Payer: Self-pay | Admitting: "Endocrinology

## 2016-08-27 ENCOUNTER — Ambulatory Visit (INDEPENDENT_AMBULATORY_CARE_PROVIDER_SITE_OTHER): Payer: Medicaid Other | Admitting: Otolaryngology

## 2016-08-27 DIAGNOSIS — H7011 Chronic mastoiditis, right ear: Secondary | ICD-10-CM

## 2016-09-10 ENCOUNTER — Other Ambulatory Visit: Payer: Self-pay | Admitting: "Endocrinology

## 2016-09-17 ENCOUNTER — Ambulatory Visit: Payer: Medicaid Other | Admitting: "Endocrinology

## 2016-09-20 ENCOUNTER — Other Ambulatory Visit: Payer: Self-pay | Admitting: "Endocrinology

## 2016-09-20 LAB — COMPREHENSIVE METABOLIC PANEL
ALBUMIN: 4.2 g/dL (ref 3.6–5.1)
ALK PHOS: 63 U/L (ref 33–130)
ALT: 22 U/L (ref 6–29)
AST: 17 U/L (ref 10–35)
BUN: 21 mg/dL (ref 7–25)
CALCIUM: 9.2 mg/dL (ref 8.6–10.4)
CHLORIDE: 105 mmol/L (ref 98–110)
CO2: 24 mmol/L (ref 20–31)
Creat: 0.79 mg/dL (ref 0.50–1.05)
Glucose, Bld: 169 mg/dL — ABNORMAL HIGH (ref 65–99)
Potassium: 4.2 mmol/L (ref 3.5–5.3)
Sodium: 139 mmol/L (ref 135–146)
TOTAL PROTEIN: 6.7 g/dL (ref 6.1–8.1)
Total Bilirubin: 0.4 mg/dL (ref 0.2–1.2)

## 2016-09-21 LAB — HEMOGLOBIN A1C
HEMOGLOBIN A1C: 8.1 % — AB (ref <5.7)
MEAN PLASMA GLUCOSE: 186 mg/dL

## 2016-09-24 ENCOUNTER — Ambulatory Visit (INDEPENDENT_AMBULATORY_CARE_PROVIDER_SITE_OTHER): Payer: Medicaid Other | Admitting: "Endocrinology

## 2016-09-24 ENCOUNTER — Encounter: Payer: Self-pay | Admitting: "Endocrinology

## 2016-09-24 VITALS — BP 145/86 | HR 69 | Ht 65.0 in | Wt 234.0 lb

## 2016-09-24 DIAGNOSIS — Z794 Long term (current) use of insulin: Secondary | ICD-10-CM

## 2016-09-24 DIAGNOSIS — E118 Type 2 diabetes mellitus with unspecified complications: Secondary | ICD-10-CM | POA: Diagnosis not present

## 2016-09-24 DIAGNOSIS — E1165 Type 2 diabetes mellitus with hyperglycemia: Secondary | ICD-10-CM

## 2016-09-24 DIAGNOSIS — I10 Essential (primary) hypertension: Secondary | ICD-10-CM

## 2016-09-24 DIAGNOSIS — IMO0002 Reserved for concepts with insufficient information to code with codable children: Secondary | ICD-10-CM

## 2016-09-24 DIAGNOSIS — E7800 Pure hypercholesterolemia, unspecified: Secondary | ICD-10-CM

## 2016-09-24 DIAGNOSIS — Z6838 Body mass index (BMI) 38.0-38.9, adult: Secondary | ICD-10-CM | POA: Diagnosis not present

## 2016-09-24 NOTE — Progress Notes (Signed)
Subjective:    Patient ID: Sandy Valencia, female    DOB: 16-Jul-1960, PCP Alonza Bogus, MD   Past Medical History:  Diagnosis Date  . Arthritis   . Asthma   . Diabetes mellitus   . Fatty liver   . GERD (gastroesophageal reflux disease)   . Hypertension   . Sleep apnea    pt siad, "i had small amount but could not tolerate CPAP and they said it was ok since it was mild.   Past Surgical History:  Procedure Laterality Date  . ABDOMINAL HYSTERECTOMY    . CHOLECYSTECTOMY    . COLONOSCOPY N/A 09/22/2012   RMR: colonic polyps -removed as described above. tubular adenoma, next TCS 09/2017  . CYST EXCISION Left 07/19/2016   Procedure: CYST REMOVAL LEFT RING FINGER;  Surgeon: Carole Civil, MD;  Location: [facility];  Service: Orthopedics;  Laterality: Left;  . CYST REMOVAL LEG Right    foot  . ESOPHAGOGASTRODUODENOSCOPY N/A 10/21/2014   RMR: Small hiatal hernia; otherwise normal EGD status post passage of a Maloney dilator.   Marland Kitchen MALONEY DILATION N/A 10/21/2014   Procedure: [name] DILATION;  Surgeon: Daneil Dolin, MD;  Location: [facility];  Service: Endoscopy;  Laterality: N/A;  . MIDDLE EAR SURGERY Right    patched hole in ear drum   Social History   Social History  . Marital status: Divorced    Spouse name: N/A  . Number of children: N/A  . Years of education: N/A   Social History Main Topics  . Smoking status: Never Smoker  . Smokeless tobacco: Never Used  . Alcohol use No  . Drug use: No  . Sexual activity: Yes    Birth control/ protection: Surgical   Other Topics Concern  . None   Social History Narrative  . None   Outpatient Encounter Prescriptions as of 09/24/2016  Medication Sig  . aspirin EC 81 MG tablet Take 81 mg by mouth daily.  . beclomethasone (QVAR) 80 MCG/ACT inhaler Inhale 2 puffs into the lungs 2 (two) times daily.  . Cholecalciferol (VITAMIN D-3) 1000 UNITS CAPS Take 1 capsule by mouth daily.  . diphenhydrAMINE (BENADRYL) 25 MG tablet Take  1 tablet (25 mg total) by mouth every 6 (six) hours. (Patient taking differently: Take 25 mg by mouth 2 (two) times daily as needed (ALLERGIC REACTIONS). )  . esomeprazole (NEXIUM) 40 MG packet Take 40 mg by mouth daily before breakfast.  . glucose blood (ACCU-CHEK COMPACT PLUS) test strip Use as instructed  . HYDROcodone-acetaminophen (NORCO) 7.5-325 MG tablet Take 1 tablet by mouth every 6 (six) hours as needed for moderate pain.  . Insulin Pen Needle (B-D ULTRAFINE III SHORT PEN) 31G X 8 MM MISC 1 each by Does not apply route as directed.  . INVOKANA 100 MG TABS tablet TAKE ONE (1) TABLET EACH DAY  . Lancets MISC 1 each by Does not apply route as directed.  Marland Kitchen LANTUS SOLOSTAR 100 UNIT/ML Solostar Pen INJECT 60 UNITS SUBCUTANEOUSLY TWICE A DAY (Patient taking differently: INJECT 60 UNITS SUBCUTANEOUSLY AT BEDTIME)  . lisinopril-hydrochlorothiazide (PRINZIDE,ZESTORETIC) 20-25 MG tablet TAKE ONE (1) TABLET BY MOUTH EVERY DAY  . Melatonin 5 MG TABS Take 5 mg by mouth at bedtime.  Marland Kitchen NOVOLOG FLEXPEN 100 UNIT/ML FlexPen INJECT 25 UNITS INTO THE SKIN THREE TIMES DAILY WITH MEALS  . simvastatin (ZOCOR) 10 MG tablet Take 1 tablet (10 mg total) by mouth at bedtime.  . vitamin B-12 (CYANOCOBALAMIN) 500 MCG  tablet Take 500 mcg by mouth daily.  . [DISCONTINUED] LANTUS SOLOSTAR 100 UNIT/ML Solostar Pen INJECT 60 UNITS SUBCUTANEOUSLY TWICE A DAY   No facility-administered encounter medications on file as of 09/24/2016.    ALLERGIES: Allergies  Allergen Reactions  . Peanut-Containing Drug Products Anaphylaxis and Swelling   VACCINATION STATUS: There is no immunization history for the selected administration types on file for this patient.  Diabetes  She presents for her follow-up diabetic visit. She has type 2 diabetes mellitus. Her disease course has been improving (She was diagnosed at approximate age of 3 years.). There are no hypoglycemic associated symptoms. Pertinent negatives for hypoglycemia  include no confusion, headaches, pallor or seizures. There are no diabetic associated symptoms. Pertinent negatives for diabetes include no chest pain, no polydipsia, no polyphagia and no polyuria. There are no hypoglycemic complications. Symptoms are improving. There are no diabetic complications. (She has history of noncompliance.) Risk factors for coronary artery disease include dyslipidemia, diabetes mellitus, hypertension, sedentary lifestyle and tobacco exposure. Current diabetic treatment includes insulin injections (She only administered 50% of prescribed insulin.). She is compliant with treatment some of the time. Her weight is stable. She is following a generally unhealthy diet. She never participates in exercise. Her home blood glucose trend is decreasing rapidly. Her breakfast blood glucose range is generally 140-180 mg/dl. Her lunch blood glucose range is generally 140-180 mg/dl. Her dinner blood glucose range is generally 140-180 mg/dl. Her overall blood glucose range is 140-180 mg/dl. An ACE inhibitor/angiotensin II receptor blocker is being taken.  Hyperlipidemia  This is a chronic problem. The current episode started more than 1 year ago. The problem is uncontrolled. Pertinent negatives include no chest pain, myalgias or shortness of breath. Current antihyperlipidemic treatment includes statins. Risk factors for coronary artery disease include diabetes mellitus, dyslipidemia, hypertension, obesity and a sedentary lifestyle.  Hypertension  This is a chronic problem. The current episode started more than 1 year ago. The problem is uncontrolled. Pertinent negatives include no chest pain, headaches, palpitations or shortness of breath. Risk factors for coronary artery disease include dyslipidemia, diabetes mellitus, obesity, sedentary lifestyle and smoking/tobacco exposure. Past treatments include nothing.     Review of Systems  Constitutional: Negative for unexpected weight change.  HENT:  Negative for trouble swallowing and voice change.   Eyes: Negative for visual disturbance.  Respiratory: Negative for cough, shortness of breath and wheezing.   Cardiovascular: Negative for chest pain, palpitations and leg swelling.  Gastrointestinal: Negative for diarrhea, nausea and vomiting.  Endocrine: Negative for cold intolerance, heat intolerance, polydipsia, polyphagia and polyuria.  Musculoskeletal: Negative for arthralgias and myalgias.  Skin: Negative for color change, pallor, rash and wound.  Neurological: Negative for seizures and headaches.  Psychiatric/Behavioral: Negative for confusion and suicidal ideas.    Objective:    BP (!) 145/86   Pulse 69   Ht '5\' 5"'  (1.651 m)   Wt 234 lb (106.1 kg)   BMI 38.94 kg/m   Wt Readings from Last 3 Encounters:  09/24/16 234 lb (106.1 kg)  07/16/16 233 lb (105.7 kg)  06/25/16 234 lb (106.1 kg)    Physical Exam  Constitutional: She is oriented to person, place, and time. She appears well-developed.  HENT:  Head: Normocephalic and atraumatic.  Eyes: EOM are normal.  Neck: Normal range of motion. Neck supple. No tracheal deviation present. No thyromegaly present.  Cardiovascular: Normal rate and regular rhythm.   Pulmonary/Chest: Effort normal and breath sounds normal.  Abdominal: Soft. Bowel sounds are normal.  There is no tenderness. There is no guarding.  Musculoskeletal: Normal range of motion. She exhibits no edema.  Neurological: She is alert and oriented to person, place, and time. She has normal reflexes. No cranial nerve deficit. Coordination normal.  Skin: Skin is warm and dry. No rash noted. No erythema. No pallor.  Psychiatric: She has a normal mood and affect. Judgment normal.   Recent Results (from the past 2160 hour(s))  CBC with Differential/Platelet     Status: None   Collection Time: 07/16/16 10:15 AM  Result Value Ref Range   WBC 4.8 4.0 - 10.5 K/uL   RBC 5.04 3.87 - 5.11 MIL/uL   Hemoglobin 14.4 12.0 - 15.0  g/dL   HCT 44.3 36.0 - 46.0 %   MCV 87.9 78.0 - 100.0 fL   MCH 28.6 26.0 - 34.0 pg   MCHC 32.5 30.0 - 36.0 g/dL   RDW 14.5 11.5 - 15.5 %   Platelets 228 150 - 400 K/uL   Neutrophils Relative % 61 %   Neutro Abs 2.9 1.7 - 7.7 K/uL   Lymphocytes Relative 30 %   Lymphs Abs 1.4 0.7 - 4.0 K/uL   Monocytes Relative 7 %   Monocytes Absolute 0.3 0.1 - 1.0 K/uL   Eosinophils Relative 2 %   Eosinophils Absolute 0.1 0.0 - 0.7 K/uL   Basophils Relative 0 %   Basophils Absolute 0.0 0.0 - 0.1 K/uL  Basic metabolic panel     Status: Abnormal   Collection Time: 07/16/16 10:15 AM  Result Value Ref Range   Sodium 137 135 - 145 mmol/L   Potassium 3.8 3.5 - 5.1 mmol/L   Chloride 105 101 - 111 mmol/L   CO2 25 22 - 32 mmol/L   Glucose, Bld 177 (H) 65 - 99 mg/dL   BUN 22 (H) 6 - 20 mg/dL   Creatinine, Ser 0.63 0.44 - 1.00 mg/dL   Calcium 9.4 8.9 - 10.3 mg/dL   GFR calc non Af Amer >60 >60 mL/min   GFR calc Af Amer >60 >60 mL/min    Comment: (NOTE) The eGFR has been calculated using the CKD EPI equation. This calculation has not been validated in all clinical situations. eGFR's persistently <60 mL/min signify possible Chronic Kidney Disease.    Anion gap 7 5 - 15  Glucose, capillary     Status: Abnormal   Collection Time: 07/19/16  8:47 AM  Result Value Ref Range   Glucose-Capillary 167 (H) 65 - 99 mg/dL  Glucose, capillary     Status: Abnormal   Collection Time: 07/19/16 10:59 AM  Result Value Ref Range   Glucose-Capillary 145 (H) 65 - 99 mg/dL  Comprehensive metabolic panel     Status: Abnormal   Collection Time: 09/20/16  7:03 AM  Result Value Ref Range   Sodium 139 135 - 146 mmol/L   Potassium 4.2 3.5 - 5.3 mmol/L   Chloride 105 98 - 110 mmol/L   CO2 24 20 - 31 mmol/L   Glucose, Bld 169 (H) 65 - 99 mg/dL   BUN 21 7 - 25 mg/dL   Creat 0.79 0.50 - 1.05 mg/dL    Comment:   For patients > or = 55 years of age: The upper reference limit for Creatinine is approximately 13% higher for  people identified as African-American.      Total Bilirubin 0.4 0.2 - 1.2 mg/dL   Alkaline Phosphatase 63 33 - 130 U/L   AST 17 10 - 35 U/L  ALT 22 6 - 29 U/L   Total Protein 6.7 6.1 - 8.1 g/dL   Albumin 4.2 3.6 - 5.1 g/dL   Calcium 9.2 8.6 - 10.4 mg/dL  Hemoglobin A1c     Status: Abnormal   Collection Time: 09/20/16  7:03 AM  Result Value Ref Range   Hgb A1c MFr Bld 8.1 (H) <5.7 %    Comment:   For someone without known diabetes, a hemoglobin A1c value of 6.5% or greater indicates that they may have diabetes and this should be confirmed with a follow-up test.   For someone with known diabetes, a value <7% indicates that their diabetes is well controlled and a value greater than or equal to 7% indicates suboptimal control. A1c targets should be individualized based on duration of diabetes, age, comorbid conditions, and other considerations.   Currently, no consensus exists for use of hemoglobin A1c for diagnosis of diabetes for children.      Mean Plasma Glucose 186 mg/dL     Assessment & Plan:   1. Uncontrolled type 2 diabetes mellitus with complication, with long-term current use of insulin ([place])   -her  diabetes is  complicated by noncompliance and patient remains at a high risk for more acute and chronic complications of diabetes which include CAD, CVA, CKD, retinopathy, and neuropathy. These are all discussed in detail with the patient. - Her average blood glucose is 180. Recent labs reviewed.  Patient came with A1c of 8.1% , Slowly improving from 8.6%.  - I have re-counseled the patient on diet management and weight loss  by adopting a carbohydrate restricted / protein rich  Diet.  - Suggestion is made for patient to avoid simple carbohydrates   from their diet including Cakes , Desserts, Ice Cream,  Soda (  diet and regular) , Sweet Tea , Candies,  Chips, Cookies, Artificial Sweeteners,   and "Sugar-free" Products .  This will help patient to have stable blood  glucose profile and potentially avoid unintended  Weight gain.  - Patient is advised to stick to a routine mealtimes to eat 3 meals  a day and avoid unnecessary snacks ( to snack only to correct hypoglycemia).  - The patient  has been  scheduled with Jearld Fenton, RDN, CDE for individualized DM education.  - I have approached patient with the following individualized plan to manage diabetes and patient agrees.  - She is advised to continue Lantus 60 qhs,  Novolog  16 units TIDAC plus SSI. Pt to continue to monitor BG ac and hs.  - She is advised to keep her appointment with Jearld Fenton, CDE for individualized DM education. I will continue Simvastatin 48m po qhs. -Continue Invokana 1037m po qam. SE discussed again to her.  - Patient will be considered for incretin therapy as appropriate next visit. - Patient specific target  for A1c; LDL, HDL, Triglycerides, and  Waist Circumference were discussed in detail.  2) BP/HTN: Uncontrolled.   I have initiated hydrochlorothiazide/lisinopril 25/20 mg by mouth daily. I counseled her on salt restriction.   3) Lipids/HPL:  continue statins. 4)  Weight/Diet: CDE consult in progress, exercise, and carbohydrates information provided. - I discussed  bariatric surgery with her which would help her significantly improved her chance of controlling diabetes without insulin. Brochure and contact information given to her.   5. Personal history of noncompliance with medical treatment, presenting hazards to health -I counseled her on the need to stay focused and treat diabetes intensively to  avoid competitions.  6) Chronic Care/Health Maintenance:  -Patient is on ACEI/ARB and Statin medications and encouraged to continue to follow up with Ophthalmology, Podiatrist at least yearly or according to recommendations, and advised to  stay away from smoking. I have recommended yearly flu vaccine and pneumonia vaccination at least every 5 years; moderate intensity  exercise for up to 150 minutes weekly; and  sleep for at least 7 hours a day.  - 25 minutes of time was spent on the care of this patient , 50% of which was applied for counseling on diabetes complications and their preventions.  - I advised patient to maintain close follow up with HAWKINS,EDWARD L, MD for primary care needs.  Patient is asked to bring meter and  blood glucose logs during their next visit.   Follow up plan: -Return in about 3 months (around 12/24/2016) for follow up with pre-visit labs, meter, and logs.  Glade Lloyd, MD Phone: [phone]  Fax: [phone]   09/24/2016, 10:45 AM

## 2016-09-24 NOTE — Patient Instructions (Signed)

## 2016-10-09 ENCOUNTER — Other Ambulatory Visit: Payer: Self-pay | Admitting: Gastroenterology

## 2016-11-01 ENCOUNTER — Other Ambulatory Visit (HOSPITAL_COMMUNITY): Payer: Self-pay | Admitting: Pulmonary Disease

## 2016-11-01 DIAGNOSIS — Z1231 Encounter for screening mammogram for malignant neoplasm of breast: Secondary | ICD-10-CM

## 2016-11-12 ENCOUNTER — Other Ambulatory Visit: Payer: Self-pay | Admitting: "Endocrinology

## 2016-11-26 ENCOUNTER — Ambulatory Visit (INDEPENDENT_AMBULATORY_CARE_PROVIDER_SITE_OTHER): Payer: Medicaid Other | Admitting: Otolaryngology

## 2016-11-26 DIAGNOSIS — H95121 Granulation of postmastoidectomy cavity, right ear: Secondary | ICD-10-CM | POA: Diagnosis not present

## 2016-12-10 ENCOUNTER — Ambulatory Visit (HOSPITAL_COMMUNITY)
Admission: RE | Admit: 2016-12-10 | Discharge: 2016-12-10 | Disposition: A | Discharge status: Home / Self Care | Payer: Medicaid Other | Source: Ambulatory Visit | Attending: Pulmonary Disease | Admitting: Pulmonary Disease

## 2016-12-10 ENCOUNTER — Other Ambulatory Visit: Payer: Self-pay | Admitting: "Endocrinology

## 2016-12-10 DIAGNOSIS — Z1231 Encounter for screening mammogram for malignant neoplasm of breast: Secondary | ICD-10-CM | POA: Diagnosis not present

## 2016-12-25 ENCOUNTER — Other Ambulatory Visit: Payer: Self-pay | Admitting: "Endocrinology

## 2016-12-25 LAB — COMPREHENSIVE METABOLIC PANEL
ALT: 42 U/L — AB (ref 6–29)
AST: 24 U/L (ref 10–35)
Albumin: 4.4 g/dL (ref 3.6–5.1)
Alkaline Phosphatase: 76 U/L (ref 33–130)
BUN: 12 mg/dL (ref 7–25)
CHLORIDE: 106 mmol/L (ref 98–110)
CO2: 21 mmol/L (ref 20–31)
CREATININE: 0.68 mg/dL (ref 0.50–1.05)
Calcium: 9.3 mg/dL (ref 8.6–10.4)
GLUCOSE: 158 mg/dL — AB (ref 65–99)
POTASSIUM: 4.1 mmol/L (ref 3.5–5.3)
Sodium: 140 mmol/L (ref 135–146)
TOTAL PROTEIN: 6.9 g/dL (ref 6.1–8.1)
Total Bilirubin: 0.5 mg/dL (ref 0.2–1.2)

## 2016-12-26 LAB — HEMOGLOBIN A1C
HEMOGLOBIN A1C: 8.4 % — AB (ref <5.7)
Mean Plasma Glucose: 194 mg/dL

## 2016-12-31 ENCOUNTER — Encounter: Payer: Self-pay | Admitting: "Endocrinology

## 2016-12-31 ENCOUNTER — Ambulatory Visit (INDEPENDENT_AMBULATORY_CARE_PROVIDER_SITE_OTHER): Payer: Medicaid Other | Admitting: "Endocrinology

## 2016-12-31 VITALS — BP 136/82 | HR 79 | Ht 65.0 in | Wt 236.2 lb

## 2016-12-31 DIAGNOSIS — Z794 Long term (current) use of insulin: Secondary | ICD-10-CM | POA: Diagnosis not present

## 2016-12-31 DIAGNOSIS — I10 Essential (primary) hypertension: Secondary | ICD-10-CM | POA: Diagnosis not present

## 2016-12-31 DIAGNOSIS — E1165 Type 2 diabetes mellitus with hyperglycemia: Secondary | ICD-10-CM

## 2016-12-31 DIAGNOSIS — E782 Mixed hyperlipidemia: Secondary | ICD-10-CM | POA: Insufficient documentation

## 2016-12-31 DIAGNOSIS — E118 Type 2 diabetes mellitus with unspecified complications: Secondary | ICD-10-CM | POA: Diagnosis not present

## 2016-12-31 DIAGNOSIS — Z6838 Body mass index (BMI) 38.0-38.9, adult: Secondary | ICD-10-CM | POA: Diagnosis not present

## 2016-12-31 DIAGNOSIS — IMO0002 Reserved for concepts with insufficient information to code with codable children: Secondary | ICD-10-CM

## 2016-12-31 MED ORDER — INSULIN ASPART 100 UNIT/ML FLEXPEN: 20.0000 [IU] | PEN_INJECTOR | Freq: Three times a day (TID) | SUBCUTANEOUS | 2 refills | Status: DC

## 2016-12-31 NOTE — Progress Notes (Signed)
Subjective:    Patient ID: Sandy Valencia, female    DOB: [birth year], PCP Sinda Du, MD   Past Medical History:  Diagnosis Date  . Arthritis   . Asthma   . Diabetes mellitus   . Fatty liver   . GERD (gastroesophageal reflux disease)   . Hypertension   . Sleep apnea    pt siad, "i had small amount but could not tolerate CPAP and they said it was ok since it was mild.   Past Surgical History:  Procedure Laterality Date  . ABDOMINAL HYSTERECTOMY    . CHOLECYSTECTOMY    . COLONOSCOPY N/A 09/22/2012   RMR: colonic polyps -removed as described above. tubular adenoma, next TCS 09/2017  . CYST EXCISION Left 07/19/2016   Procedure: CYST REMOVAL LEFT RING FINGER;  Surgeon: Carole Civil, MD;  Location: [facility];  Service: Orthopedics;  Laterality: Left;  . CYST REMOVAL LEG Right    foot  . ESOPHAGOGASTRODUODENOSCOPY N/A 10/21/2014   RMR: Small hiatal hernia; otherwise normal EGD status post passage of a Maloney dilator.   Marland Kitchen MALONEY DILATION N/A 10/21/2014   Procedure: [name] DILATION;  Surgeon: Daneil Dolin, MD;  Location: [facility];  Service: Endoscopy;  Laterality: N/A;  . MIDDLE EAR SURGERY Right    patched hole in ear drum   Social History   Social History  . Marital status: Divorced    Spouse name: N/A  . Number of children: N/A  . Years of education: N/A   Social History Main Topics  . Smoking status: Never Smoker  . Smokeless tobacco: Never Used  . Alcohol use No  . Drug use: No  . Sexual activity: Yes    Birth control/ protection: Surgical   Other Topics Concern  . Not on file   Social History Narrative  . No narrative on file   Outpatient Encounter Prescriptions as of 12/31/2016  Medication Sig  . aspirin EC 81 MG tablet Take 81 mg by mouth daily.  . beclomethasone (QVAR) 80 MCG/ACT inhaler Inhale 2 puffs into the lungs 2 (two) times daily.  . Cholecalciferol (VITAMIN D-3) 1000 UNITS CAPS Take 1 capsule by mouth daily.  . diphenhydrAMINE  (BENADRYL) 25 MG tablet Take 1 tablet (25 mg total) by mouth every 6 (six) hours. (Patient taking differently: Take 25 mg by mouth 2 (two) times daily as needed (ALLERGIC REACTIONS). )  . esomeprazole (NEXIUM) 40 MG packet Take 40 mg by mouth daily before breakfast.  . glucose blood (ACCU-CHEK COMPACT PLUS) test strip Use as instructed  . HYDROcodone-acetaminophen (NORCO) 7.5-325 MG tablet Take 1 tablet by mouth every 6 (six) hours as needed for moderate pain.  Marland Kitchen insulin aspart (NOVOLOG FLEXPEN) 100 UNIT/ML FlexPen Inject 20-26 Units into the skin 3 (three) times daily with meals.  . Insulin Pen Needle (B-D ULTRAFINE III SHORT PEN) 31G X 8 MM MISC 1 each by Does not apply route as directed.  . INVOKANA 100 MG TABS tablet TAKE ONE TABLET ONCE DAILY  . Lancets MISC 1 each by Does not apply route as directed.  Marland Kitchen LANTUS SOLOSTAR 100 UNIT/ML Solostar Pen INJECT 60 UNITS SUBCUTANEOUSLY TWICE A DAY  . lisinopril-hydrochlorothiazide (PRINZIDE,ZESTORETIC) 20-25 MG tablet TAKE ONE (1) TABLET BY MOUTH EVERY DAY  . Melatonin 5 MG TABS Take 5 mg by mouth at bedtime.  . pantoprazole (PROTONIX) 40 MG tablet TAKE ONE (1) TABLET BY MOUTH EVERY DAY  . simvastatin (ZOCOR) 10 MG tablet Take 1 tablet (10  mg total) by mouth at bedtime.  . vitamin B-12 (CYANOCOBALAMIN) 500 MCG tablet Take 500 mcg by mouth daily.  . [DISCONTINUED] LANTUS SOLOSTAR 100 UNIT/ML Solostar Pen INJECT 60 UNITS SUBCUTANEOUSLY TWICE A DAY (Patient taking differently: INJECT 60 UNITS SUBCUTANEOUSLY AT BEDTIME)  . [DISCONTINUED] NOVOLOG FLEXPEN 100 UNIT/ML FlexPen INJECT 25 UNITS INTO THE SKIN THREE TIMES DAILY WITH MEALS   No facility-administered encounter medications on file as of 12/31/2016.    ALLERGIES: Allergies  Allergen Reactions  . Peanut-Containing Drug Products Anaphylaxis and Swelling   VACCINATION STATUS: There is no immunization history for the selected administration types on file for this patient.  Diabetes  She presents  for her follow-up diabetic visit. She has type 2 diabetes mellitus. Her disease course has been stable (She was diagnosed at approximate age of 34 years.). There are no hypoglycemic associated symptoms. Pertinent negatives for hypoglycemia include no confusion, headaches, pallor or seizures. There are no diabetic associated symptoms. Pertinent negatives for diabetes include no chest pain, no polydipsia, no polyphagia and no polyuria. There are no hypoglycemic complications. Symptoms are stable. There are no diabetic complications. (She has history of noncompliance.) Risk factors for coronary artery disease include dyslipidemia, diabetes mellitus, hypertension, sedentary lifestyle and tobacco exposure. Current diabetic treatment includes insulin injections (She only administered 50% of prescribed insulin.). She is compliant with treatment some of the time. Her weight is stable. She is following a generally unhealthy diet. She never participates in exercise. Her home blood glucose trend is decreasing rapidly. Her breakfast blood glucose range is generally 140-180 mg/dl. Her lunch blood glucose range is generally 140-180 mg/dl. Her dinner blood glucose range is generally 140-180 mg/dl. Her overall blood glucose range is 140-180 mg/dl. An ACE inhibitor/angiotensin II receptor blocker is being taken.  Hyperlipidemia  This is a chronic problem. The current episode started more than 1 year ago. The problem is uncontrolled. Pertinent negatives include no chest pain, myalgias or shortness of breath. Current antihyperlipidemic treatment includes statins. Risk factors for coronary artery disease include diabetes mellitus, dyslipidemia, hypertension, obesity and a sedentary lifestyle.  Hypertension  This is a chronic problem. The current episode started more than 1 year ago. The problem is uncontrolled. Pertinent negatives include no chest pain, headaches, palpitations or shortness of breath. Risk factors for coronary  artery disease include dyslipidemia, diabetes mellitus, obesity, sedentary lifestyle and smoking/tobacco exposure. Past treatments include nothing.     Review of Systems  Constitutional: Negative for unexpected weight change.  HENT: Negative for trouble swallowing and voice change.   Eyes: Negative for visual disturbance.  Respiratory: Negative for cough, shortness of breath and wheezing.   Cardiovascular: Negative for chest pain, palpitations and leg swelling.  Gastrointestinal: Negative for diarrhea, nausea and vomiting.  Endocrine: Negative for cold intolerance, heat intolerance, polydipsia, polyphagia and polyuria.  Musculoskeletal: Negative for arthralgias and myalgias.  Skin: Negative for color change, pallor, rash and wound.  Neurological: Negative for seizures and headaches.  Psychiatric/Behavioral: Negative for confusion and suicidal ideas.    Objective:    BP 136/82 (BP Location: Left Arm, Patient Position: Sitting, Cuff Size: Large)   Pulse 79   Ht 5\' 5"  (1.651 m)   Wt 236 lb 3.2 oz (107.1 kg)   SpO2 96%   BMI 39.31 kg/m   Wt Readings from Last 3 Encounters:  12/31/16 236 lb 3.2 oz (107.1 kg)  09/24/16 234 lb (106.1 kg)  07/16/16 233 lb (105.7 kg)    Physical Exam  Constitutional: She is oriented  to person, place, and time. She appears well-developed.  HENT:  Head: Normocephalic and atraumatic.  Eyes: EOM are normal.  Neck: Normal range of motion. Neck supple. No tracheal deviation present. No thyromegaly present.  Cardiovascular: Normal rate and regular rhythm.   Pulmonary/Chest: Effort normal and breath sounds normal.  Abdominal: Soft. Bowel sounds are normal. There is no tenderness. There is no guarding.  Musculoskeletal: Normal range of motion. She exhibits no edema.  Neurological: She is alert and oriented to person, place, and time. She has normal reflexes. No cranial nerve deficit. Coordination normal.  Skin: Skin is warm and dry. No rash noted. No  erythema. No pallor.  Psychiatric: She has a normal mood and affect. Judgment normal.   Recent Results (from the past 2160 hour(s))  Comprehensive metabolic panel     Status: Abnormal   Collection Time: 12/25/16  8:03 AM  Result Value Ref Range   Sodium 140 135 - 146 mmol/L   Potassium 4.1 3.5 - 5.3 mmol/L   Chloride 106 98 - 110 mmol/L   CO2 21 20 - 31 mmol/L   Glucose, Bld 158 (H) 65 - 99 mg/dL   BUN 12 7 - 25 mg/dL   Creat 0.68 0.50 - 1.05 mg/dL    Comment:   For patients > or = 55 years of age: The upper reference limit for Creatinine is approximately 13% higher for people identified as African-American.      Total Bilirubin 0.5 0.2 - 1.2 mg/dL   Alkaline Phosphatase 76 33 - 130 U/L   AST 24 10 - 35 U/L   ALT 42 (H) 6 - 29 U/L   Total Protein 6.9 6.1 - 8.1 g/dL   Albumin 4.4 3.6 - 5.1 g/dL   Calcium 9.3 8.6 - 10.4 mg/dL  Hemoglobin A1c     Status: Abnormal   Collection Time: 12/25/16  8:03 AM  Result Value Ref Range   Hgb A1c MFr Bld 8.4 (H) <5.7 %    Comment:   For someone without known diabetes, a hemoglobin A1c value of 6.5% or greater indicates that they may have diabetes and this should be confirmed with a follow-up test.   For someone with known diabetes, a value <7% indicates that their diabetes is well controlled and a value greater than or equal to 7% indicates suboptimal control. A1c targets should be individualized based on duration of diabetes, age, comorbid conditions, and other considerations.   Currently, no consensus exists for use of hemoglobin A1c for diagnosis of diabetes for children.      Mean Plasma Glucose 194 mg/dL     Assessment & Plan:   1. Uncontrolled type 2 diabetes mellitus with complication, with long-term current use of insulin ([place])   -her  diabetes is  complicated by noncompliance and patient remains at a high risk for more acute and chronic complications of diabetes which include CAD, CVA, CKD, retinopathy, and neuropathy.  These are all discussed in detail with the patient. - Her average blood glucose is 180. Recent labs reviewed.  Patient came with A1c of 8.4% , unchanged from last visit, Slowly improving.  - I have re-counseled the patient on diet management and weight loss  by adopting a carbohydrate restricted / protein rich  Diet.  - Suggestion is made for patient to avoid simple carbohydrates   from her diet including Cakes , Desserts, Ice Cream,  Soda (  diet and regular) , Sweet Tea , Candies,  Chips, Cookies, Artificial Sweeteners,  and "Sugar-free" Products .  This will help patient to have stable blood glucose profile and potentially avoid unintended  Weight gain.  - Patient is advised to stick to a routine mealtimes to eat 3 meals  a day and avoid unnecessary snacks ( to snack only to correct hypoglycemia).  - I have approached patient with the following individualized plan to manage diabetes and patient agrees.  - She is advised to continue Lantus 60 qhs, increase Novolog  to 20 units TIDAC plus SSI. Pt to continue to monitor blood glucose 4 times a day-before meals and at bedtime.  - She is advised to keep her appointment with Jearld Fenton, CDE for individualized DM education. I will continue Simvastatin 20mg  po qhs. -Continue Invokana 174mmg po qam. SE discussed again to her.  - Patient will be considered for incretin therapy as appropriate next visit. - Patient specific target  for A1c; LDL, HDL, Triglycerides, and  Waist Circumference were discussed in detail.  2) BP/HTN: Uncontrolled.   I have initiated hydrochlorothiazide/lisinopril 25/20 mg by mouth daily. I counseled her on salt restriction.   3) Lipids/HPL:  continue statins. 4)  Weight/Diet: CDE consult in progress, exercise, and carbohydrates information provided. - I discussed  bariatric surgery with her which would help her significantly improved her chance of controlling diabetes without insulin. Brochure and contact information  given to her.   5. Personal history of noncompliance with medical treatment, presenting hazards to health -I counseled her on the need to stay focused and treat diabetes intensively to  avoid competitions.  6) Chronic Care/Health Maintenance:  -Patient is on ACEI/ARB and Statin medications and encouraged to continue to follow up with Ophthalmology, Podiatrist at least yearly or according to recommendations, and advised to  stay away from smoking. I have recommended yearly flu vaccine and pneumonia vaccination at least every 5 years; moderate intensity exercise for up to 150 minutes weekly; and  sleep for at least 7 hours a day.  - 20 minutes of time was spent on the care of this patient , 50% of which was applied for counseling on diabetes complications and their preventions.  - I advised patient to maintain close follow up with Sinda Du, MD for primary care needs.  Patient is asked to bring meter and  blood glucose logs during her next visit.   Follow up plan: -Return in about 3 months (around 04/02/2017) for meter, and logs.  Glade Lloyd, MD Phone: [phone]  Fax: [phone]   12/31/2016, 3:34 PM

## 2016-12-31 NOTE — Patient Instructions (Signed)

## 2017-02-13 ENCOUNTER — Other Ambulatory Visit: Payer: Self-pay | Admitting: "Endocrinology

## 2017-02-18 ENCOUNTER — Ambulatory Visit (INDEPENDENT_AMBULATORY_CARE_PROVIDER_SITE_OTHER): Payer: Medicaid Other | Admitting: Otolaryngology

## 2017-02-18 DIAGNOSIS — H7011 Chronic mastoiditis, right ear: Secondary | ICD-10-CM | POA: Diagnosis not present

## 2017-03-11 ENCOUNTER — Other Ambulatory Visit: Payer: Self-pay | Admitting: "Endocrinology

## 2017-04-08 ENCOUNTER — Ambulatory Visit: Payer: Medicaid Other | Admitting: "Endocrinology

## 2017-04-12 ENCOUNTER — Other Ambulatory Visit: Payer: Self-pay | Admitting: Gastroenterology

## 2017-04-12 ENCOUNTER — Other Ambulatory Visit: Payer: Self-pay | Admitting: "Endocrinology

## 2017-04-12 NOTE — Telephone Encounter (Signed)
Needs ov for further refills. Given one refill today. Last seen in 2016.

## 2017-04-15 NOTE — Telephone Encounter (Signed)
Noted. Pt will call back to make an appointment if her medical doctor cant refill Protonix at her physical.

## 2017-04-20 LAB — T4, FREE: Free T4: 1.2 ng/dL (ref 0.8–1.8)

## 2017-04-20 LAB — HEMOGLOBIN A1C
EAG (MMOL/L): 11.4 (calc)
Hgb A1c MFr Bld: 8.8 % of total Hgb — ABNORMAL HIGH (ref <5.7)
MEAN PLASMA GLUCOSE: 206 (calc)

## 2017-04-20 LAB — LIPID PANEL
CHOLESTEROL: 133 mg/dL (ref <200)
HDL: 69 mg/dL (ref >50)
LDL CHOLESTEROL (CALC): 50 mg/dL
Non-HDL Cholesterol (Calc): 64 mg/dL (calc) (ref <130)
Total CHOL/HDL Ratio: 1.9 (calc) (ref <5.0)
Triglycerides: 62 mg/dL (ref <150)

## 2017-04-20 LAB — RENAL FUNCTION PANEL
Albumin: 4.8 g/dL (ref 3.6–5.1)
BUN: 19 mg/dL (ref 7–25)
CALCIUM: 10 mg/dL (ref 8.6–10.4)
CHLORIDE: 100 mmol/L (ref 98–110)
CO2: 29 mmol/L (ref 20–32)
CREATININE: 0.63 mg/dL (ref 0.50–1.05)
GLUCOSE: 190 mg/dL — AB (ref 65–99)
PHOSPHORUS: 4 mg/dL (ref 2.5–4.5)
POTASSIUM: 4.2 mmol/L (ref 3.5–5.3)
Sodium: 137 mmol/L (ref 135–146)

## 2017-04-20 LAB — MICROALBUMIN / CREATININE URINE RATIO
Creatinine, Urine: 56 mg/dL (ref 20–275)
Microalb Creat Ratio: 29 mcg/mg creat (ref <30)
Microalb, Ur: 1.6 mg/dL

## 2017-04-20 LAB — TSH: TSH: 4.88 mIU/L — ABNORMAL HIGH

## 2017-04-22 ENCOUNTER — Ambulatory Visit (INDEPENDENT_AMBULATORY_CARE_PROVIDER_SITE_OTHER): Payer: Medicaid Other | Admitting: "Endocrinology

## 2017-04-22 ENCOUNTER — Encounter: Payer: Self-pay | Admitting: "Endocrinology

## 2017-04-22 VITALS — BP 125/80 | HR 80 | Ht 65.0 in | Wt 230.0 lb

## 2017-04-22 DIAGNOSIS — IMO0002 Reserved for concepts with insufficient information to code with codable children: Secondary | ICD-10-CM

## 2017-04-22 DIAGNOSIS — E039 Hypothyroidism, unspecified: Secondary | ICD-10-CM | POA: Insufficient documentation

## 2017-04-22 DIAGNOSIS — E1165 Type 2 diabetes mellitus with hyperglycemia: Secondary | ICD-10-CM | POA: Diagnosis not present

## 2017-04-22 DIAGNOSIS — Z794 Long term (current) use of insulin: Secondary | ICD-10-CM

## 2017-04-22 DIAGNOSIS — Z6838 Body mass index (BMI) 38.0-38.9, adult: Secondary | ICD-10-CM

## 2017-04-22 DIAGNOSIS — E782 Mixed hyperlipidemia: Secondary | ICD-10-CM

## 2017-04-22 DIAGNOSIS — E118 Type 2 diabetes mellitus with unspecified complications: Secondary | ICD-10-CM

## 2017-04-22 DIAGNOSIS — I10 Essential (primary) hypertension: Secondary | ICD-10-CM

## 2017-04-22 MED ORDER — METFORMIN HCL ER 500 MG PO TB24: 500.0000 mg | ORAL_TABLET | Freq: Every day | ORAL | 3 refills | Status: DC

## 2017-04-22 MED ORDER — LEVOTHYROXINE SODIUM 50 MCG PO TABS: 50.0000 ug | ORAL_TABLET | Freq: Every day | ORAL | 3 refills | Status: DC

## 2017-04-22 NOTE — Patient Instructions (Signed)

## 2017-04-22 NOTE — Progress Notes (Signed)
Subjective:    Patient ID: Sandy Valencia, female    DOB: April 16, 1961, PCP Sinda Du, MD   Past Medical History:  Diagnosis Date  . Arthritis   . Asthma   . Diabetes mellitus   . Fatty liver   . GERD (gastroesophageal reflux disease)   . Hypertension   . Sleep apnea    pt siad, "i had small amount but could not tolerate CPAP and they said it was ok since it was mild.   Past Surgical History:  Procedure Laterality Date  . ABDOMINAL HYSTERECTOMY    . CHOLECYSTECTOMY    . COLONOSCOPY N/A 09/22/2012   Performed by Daneil Dolin, MD at [facility]  . CYST REMOVAL LEFT RING FINGER Left 07/19/2016   Performed by Carole Civil, MD at [facility]  . CYST REMOVAL LEG Right    foot  . ESOPHAGOGASTRODUODENOSCOPY (EGD) N/A 10/21/2014   Performed by Daneil Dolin, MD at [place]  . MALONEY DILATION N/A 10/21/2014   Performed by Daneil Dolin, MD at [place]  . MIDDLE EAR SURGERY Right    patched hole in ear drum   Social History   Socioeconomic History  . Marital status: Divorced    Spouse name: None  . Number of children: None  . Years of education: None  . Highest education level: None  Social Needs  . Financial resource strain: None  . Food insecurity - worry: None  . Food insecurity - inability: None  . Transportation needs - medical: None  . Transportation needs - non-medical: None  Occupational History  . None  Tobacco Use  . Smoking status: Never Smoker  . Smokeless tobacco: Never Used  Substance and Sexual Activity  . Alcohol use: No    Alcohol/week: 0.0 oz  . Drug use: No  . Sexual activity: Yes    Birth control/protection: Surgical  Other Topics Concern  . None  Social History Narrative  . None   Outpatient Encounter Medications as of 04/22/2017  Medication Sig  . pregabalin (LYRICA) 150 MG capsule Take 150 mg 2 (two) times daily by mouth.  Marland Kitchen aspirin EC 81 MG tablet Take 81 mg by mouth daily.  . beclomethasone (QVAR) 80 MCG/ACT  inhaler Inhale 2 puffs into the lungs 2 (two) times daily.  . Cholecalciferol (VITAMIN D-3) 1000 UNITS CAPS Take 1 capsule by mouth daily.  . diphenhydrAMINE (BENADRYL) 25 MG tablet Take 1 tablet (25 mg total) by mouth every 6 (six) hours. (Patient taking differently: Take 25 mg by mouth 2 (two) times daily as needed (ALLERGIC REACTIONS). )  . esomeprazole (NEXIUM) 40 MG packet Take 40 mg by mouth daily before breakfast.  . glucose blood (ACCU-CHEK COMPACT PLUS) test strip Use as instructed  . HYDROcodone-acetaminophen (NORCO) 7.5-325 MG tablet Take 1 tablet by mouth every 6 (six) hours as needed for moderate pain.  Marland Kitchen insulin aspart (NOVOLOG FLEXPEN) 100 UNIT/ML FlexPen Inject 20-26 Units into the skin 3 (three) times daily with meals.  . Insulin Pen Needle (B-D ULTRAFINE III SHORT PEN) 31G X 8 MM MISC 1 each by Does not apply route as directed.  . Lancets MISC 1 each by Does not apply route as directed.  Marland Kitchen LANTUS SOLOSTAR 100 UNIT/ML Solostar Pen INJECT 60 UNITS SUBCUTANEOUSLY TWICE A DAY  . levothyroxine (SYNTHROID, LEVOTHROID) 50 MCG tablet Take 1 tablet (50 mcg total) daily before breakfast by mouth.  Marland Kitchen lisinopril-hydrochlorothiazide (PRINZIDE,ZESTORETIC) 20-25 MG tablet TAKE  ONE (1) TABLET BY MOUTH EVERY DAY  . Melatonin 5 MG TABS Take 5 mg by mouth at bedtime.  . metFORMIN (GLUCOPHAGE XR) 500 MG 24 hr tablet Take 1 tablet (500 mg total) daily with breakfast by mouth.  . pantoprazole (PROTONIX) 40 MG tablet TAKE ONE (1) TABLET EACH DAY  . simvastatin (ZOCOR) 10 MG tablet TAKE ONE TABLET BY MOUTH EVERY NIGHT AT BEDTIME  . vitamin B-12 (CYANOCOBALAMIN) 500 MCG tablet Take 500 mcg by mouth daily.  . [DISCONTINUED] INVOKANA 100 MG TABS tablet TAKE ONE (1) TABLET EACH DAY   No facility-administered encounter medications on file as of 04/22/2017.    ALLERGIES: Allergies  Allergen Reactions  . Peanut-Containing Drug Products Anaphylaxis and Swelling   VACCINATION STATUS: There is no  immunization history for the selected administration types on file for this patient.  Diabetes  She presents for her follow-up diabetic visit. She has type 2 diabetes mellitus. Her disease course has been worsening (She was diagnosed at approximate age of 25 years.). There are no hypoglycemic associated symptoms. Pertinent negatives for hypoglycemia include no confusion, headaches, pallor or seizures. There are no diabetic associated symptoms. Pertinent negatives for diabetes include no chest pain, no polydipsia, no polyphagia and no polyuria. There are no hypoglycemic complications. Symptoms are worsening. There are no diabetic complications. (She has history of noncompliance.) Risk factors for coronary artery disease include dyslipidemia, diabetes mellitus, hypertension, sedentary lifestyle and tobacco exposure. Current diabetic treatment includes insulin injections (She only administered 50% of prescribed insulin.). She is compliant with treatment some of the time. Her weight is stable. She is following a generally unhealthy diet. She never participates in exercise. Her home blood glucose trend is decreasing rapidly. Her breakfast blood glucose range is generally 180-200 mg/dl. Her lunch blood glucose range is generally 180-200 mg/dl. Her dinner blood glucose range is generally 180-200 mg/dl. Her overall blood glucose range is 180-200 mg/dl. An ACE inhibitor/angiotensin II receptor blocker is being taken.  Hyperlipidemia  This is a chronic problem. The current episode started more than 1 year ago. The problem is uncontrolled. Pertinent negatives include no chest pain, myalgias or shortness of breath. Current antihyperlipidemic treatment includes statins. Risk factors for coronary artery disease include diabetes mellitus, dyslipidemia, hypertension, obesity and a sedentary lifestyle.  Hypertension  This is a chronic problem. The current episode started more than 1 year ago. The problem is uncontrolled.  Pertinent negatives include no chest pain, headaches, palpitations or shortness of breath. Risk factors for coronary artery disease include dyslipidemia, diabetes mellitus, obesity, sedentary lifestyle and smoking/tobacco exposure. Past treatments include nothing.     Review of Systems  Constitutional: Negative for unexpected weight change.  HENT: Negative for trouble swallowing and voice change.   Eyes: Negative for visual disturbance.  Respiratory: Negative for cough, shortness of breath and wheezing.   Cardiovascular: Negative for chest pain, palpitations and leg swelling.  Gastrointestinal: Negative for diarrhea, nausea and vomiting.  Endocrine: Negative for cold intolerance, heat intolerance, polydipsia, polyphagia and polyuria.  Musculoskeletal: Negative for arthralgias and myalgias.  Skin: Negative for color change, pallor, rash and wound.  Neurological: Negative for seizures and headaches.  Psychiatric/Behavioral: Negative for confusion and suicidal ideas.    Objective:    BP 125/80   Pulse 80   Ht 5\' 5"  (1.651 m)   Wt 230 lb (104.3 kg)   BMI 38.27 kg/m   Wt Readings from Last 3 Encounters:  04/22/17 230 lb (104.3 kg)  12/31/16 236 lb 3.2 oz (  107.1 kg)  09/24/16 234 lb (106.1 kg)    Physical Exam  Constitutional: She is oriented to person, place, and time. She appears well-developed.  HENT:  Head: Normocephalic and atraumatic.  Eyes: EOM are normal.  Neck: Normal range of motion. Neck supple. No tracheal deviation present. No thyromegaly present.  Cardiovascular: Normal rate and regular rhythm.  Pulmonary/Chest: Effort normal and breath sounds normal.  Abdominal: Soft. Bowel sounds are normal. There is no tenderness. There is no guarding.  Musculoskeletal: Normal range of motion. She exhibits no edema.  Neurological: She is alert and oriented to person, place, and time. She has normal reflexes. No cranial nerve deficit. Coordination normal.  Skin: Skin is warm and  dry. No rash noted. No erythema. No pallor.  Psychiatric: She has a normal mood and affect. Judgment normal.   Recent Results (from the past 2160 hour(s))  Renal function panel     Status: Abnormal   Collection Time: 04/19/17  7:59 AM  Result Value Ref Range   Glucose, Bld 190 (H) 65 - 99 mg/dL    Comment: .            Fasting reference interval . For someone without known diabetes, a glucose value >125 mg/dL indicates that they may have diabetes and this should be confirmed with a follow-up test. .    BUN 19 7 - 25 mg/dL   Creat 0.63 0.50 - 1.05 mg/dL    Comment: For patients >71 years of age, the reference limit for Creatinine is approximately 13% higher for people identified as African-American. .    BUN/Creatinine Ratio NOT APPLICABLE 6 - 22 (calc)   Sodium 137 135 - 146 mmol/L   Potassium 4.2 3.5 - 5.3 mmol/L   Chloride 100 98 - 110 mmol/L   CO2 29 20 - 32 mmol/L   Calcium 10.0 8.6 - 10.4 mg/dL   Phosphorus 4.0 2.5 - 4.5 mg/dL   Albumin 4.8 3.6 - 5.1 g/dL  Hemoglobin A1c     Status: Abnormal   Collection Time: 04/19/17  7:59 AM  Result Value Ref Range   Hgb A1c MFr Bld 8.8 (H) <5.7 % of total Hgb    Comment: For someone without known diabetes, a hemoglobin A1c value of 6.5% or greater indicates that they may have  diabetes and this should be confirmed with a follow-up  test. . For someone with known diabetes, a value <7% indicates  that their diabetes is well controlled and a value  greater than or equal to 7% indicates suboptimal  control. A1c targets should be individualized based on  duration of diabetes, age, comorbid conditions, and  other considerations. . Currently, no consensus exists regarding use of hemoglobin A1c for diagnosis of diabetes for children. .    Mean Plasma Glucose 206 (calc)   eAG (mmol/L) 11.4 (calc)  Lipid panel     Status: None   Collection Time: 04/19/17  7:59 AM  Result Value Ref Range   Cholesterol 133 <200 mg/dL   HDL 69  >50 mg/dL   Triglycerides 62 <150 mg/dL   LDL Cholesterol (Calc) 50 mg/dL (calc)    Comment: Reference range: <100 . Desirable range <100 mg/dL for primary prevention;   <70 mg/dL for patients with CHD or diabetic patients  with > or = 2 CHD risk factors. Marland Kitchen LDL-C is now calculated using the Martin-Hopkins  calculation, which is a validated novel method providing  better accuracy than the Friedewald equation in the  estimation of  LDL-C.  Cresenciano Genre et al. Annamaria Helling. 2458;099(83): 2061-2068  (http://education.[facility].com/faq/FAQ164)    Total CHOL/HDL Ratio 1.9 <5.0 (calc)   Non-HDL Cholesterol (Calc) 64 <130 mg/dL (calc)    Comment: For patients with diabetes plus 1 major ASCVD risk  factor, treating to a non-HDL-C goal of <100 mg/dL  (LDL-C of <70 mg/dL) is considered a therapeutic  option.   Microalbumin / creatinine urine ratio     Status: None   Collection Time: 04/19/17  7:59 AM  Result Value Ref Range   Creatinine, Urine 56 20 - 275 mg/dL   Microalb, Ur 1.6 mg/dL    Comment: Reference Range Not established    Microalb Creat Ratio 29 <30 mcg/mg creat    Comment: . The ADA defines abnormalities in albumin excretion as follows: Marland Kitchen Category         Result (mcg/mg creatinine) . Normal                    <30 Microalbuminuria         30-299  Clinical albuminuria   > OR = 300 . The ADA recommends that at least two of three specimens collected within a 3-6 month period be abnormal before considering a patient to be within a diagnostic category.   TSH     Status: Abnormal   Collection Time: 04/19/17  7:59 AM  Result Value Ref Range   TSH 4.88 (H) mIU/L    Comment:           Reference Range .           > or = 20 Years  0.40-4.50 .                Pregnancy Ranges           First trimester    0.26-2.66           Second trimester   0.55-2.73           Third trimester    0.43-2.91   T4, free     Status: None   Collection Time: 04/19/17  7:59 AM  Result Value Ref  Range   Free T4 1.2 0.8 - 1.8 ng/dL     Assessment & Plan:   1. Uncontrolled type 2 diabetes mellitus with complication, with long-term current use of insulin ([place])  -her  diabetes is  complicated by noncompliance and patient remains at a high risk for more acute and chronic complications of diabetes which include CAD, CVA, CKD, retinopathy, and neuropathy. These are all discussed in detail with the patient. - Her average blood glucose is 180. Recent labs reviewed.  Patient came with a higher  A1c of 8.8%. - I have re-counseled the patient on diet management and weight loss  by adopting a carbohydrate restricted / protein rich  Diet.  -  Suggestion is made for her to avoid simple carbohydrates  from her diet including Cakes, Sweet Desserts / Pastries, Ice Cream, Soda (diet and regular), Sweet Tea, Candies, Chips, Cookies, Store Bought Juices, Alcohol in Excess of  1-2 drinks a day, Artificial Sweeteners, and "Sugar-free" Products. This will help patient to have stable blood glucose profile and potentially avoid unintended weight gain.  - Patient is advised to stick to a routine mealtimes to eat 3 meals  a day and avoid unnecessary snacks ( to snack only to correct hypoglycemia).  - I have approached patient with the following individualized plan to manage diabetes and patient agrees.  -  She is advised to continue Lantus 60 daily at bedtime,  Novolog  20 units 3 times a day before meals plus correction. Pt to continue to monitor blood glucose 4 times a day-before meals and at bedtime.   - I advised her to discontinue  Invokana. - She is willing to retry low-dose metformin extended release. I discussed and prescribed metformin 500 mg  ER to take one time daily after breakfast. - Patient will be considered for incretin therapy as appropriate next visit. - Patient specific target  for A1c; LDL, HDL, Triglycerides, and  Waist Circumference were discussed in detail.  2) BP/HTN: controlled.   I  have initiated hydrochlorothiazide/lisinopril 25/20 mg by mouth daily. I counseled her on salt restriction.   3) Lipids/HPL:  continue statins. 4)  Weight/Diet: CDE consult in progress, exercise, and carbohydrates information provided. - I discussed  bariatric surgery with her which would help her significantly improved her chance of controlling diabetes without insulin. Brochure and contact information given to her.   5. Personal history of noncompliance with medical treatment, presenting hazards to health -I counseled her on the need to stay focused and treat diabetes intensively to  avoid competitions.  5) Hypothyroidism: New diagnosis I discussed and initiated Levothyroxine 41mcg po qam, to advance as needed.  - We discussed about correct intake of levothyroxine, at fasting, with water, separated by at least 30 minutes from breakfast, and separated by more than 4 hours from calcium, iron, multivitamins, acid reflux medications (PPIs). -Patient is made aware of the fact that thyroid hormone replacement is needed for life, dose to be adjusted by periodic monitoring of thyroid function tests.  6) Chronic Care/Health Maintenance:  -Patient is on ACEI/ARB and Statin medications and encouraged to continue to follow up with Ophthalmology, Podiatrist at least yearly or according to recommendations, and advised to  stay away from smoking. I have recommended yearly flu vaccine and pneumonia vaccination at least every 5 years; moderate intensity exercise for up to 150 minutes weekly; and  sleep for at least 7 hours a day.  - I advised patient to maintain close follow up with Sinda Du, MD for primary care needs.  - Time spent with the patient: 25 min, of which >50% was spent in reviewing her sugar logs , discussing her hypo- and hyper-glycemic episodes, reviewing her current and  previous labs and insulin doses and developing a plan to avoid hypo- and hyper-glycemia.    Follow up plan: -Return  in about 3 months (around 07/23/2017) for meter, and logs, follow up with pre-visit labs, meter, and logs.  Glade Lloyd, MD Phone: [phone]  Fax: [phone]   -  This note was partially dictated with voice recognition software. Similar sounding words can be transcribed inadequately or may not  be corrected upon review.  04/22/2017, 10:14 AM

## 2017-05-14 ENCOUNTER — Other Ambulatory Visit: Payer: Self-pay | Admitting: "Endocrinology

## 2017-05-20 ENCOUNTER — Ambulatory Visit (INDEPENDENT_AMBULATORY_CARE_PROVIDER_SITE_OTHER): Payer: Medicaid Other | Admitting: Otolaryngology

## 2017-05-20 DIAGNOSIS — H95121 Granulation of postmastoidectomy cavity, right ear: Secondary | ICD-10-CM

## 2017-06-04 DIAGNOSIS — U071 COVID-19: Secondary | ICD-10-CM

## 2017-06-04 HISTORY — DX: COVID-19: U07.1

## 2017-06-13 ENCOUNTER — Other Ambulatory Visit: Payer: Self-pay | Admitting: "Endocrinology

## 2017-08-02 LAB — T4, FREE: Free T4: 1.3 ng/dL (ref 0.8–1.8)

## 2017-08-02 LAB — COMPLETE METABOLIC PANEL WITH GFR
AG RATIO: 1.6 (calc) (ref 1.0–2.5)
ALBUMIN MSPROF: 4.4 g/dL (ref 3.6–5.1)
ALT: 28 U/L (ref 6–29)
AST: 20 U/L (ref 10–35)
Alkaline phosphatase (APISO): 90 U/L (ref 33–130)
BUN: 18 mg/dL (ref 7–25)
CALCIUM: 9.8 mg/dL (ref 8.6–10.4)
CO2: 29 mmol/L (ref 20–32)
Chloride: 102 mmol/L (ref 98–110)
Creat: 0.74 mg/dL (ref 0.50–1.05)
GFR, EST AFRICAN AMERICAN: 106 mL/min/{1.73_m2} (ref >=60)
GFR, EST NON AFRICAN AMERICAN: 91 mL/min/{1.73_m2} (ref >=60)
GLOBULIN: 2.7 g/dL (ref 1.9–3.7)
Glucose, Bld: 161 mg/dL — ABNORMAL HIGH (ref 65–99)
POTASSIUM: 4.3 mmol/L (ref 3.5–5.3)
SODIUM: 137 mmol/L (ref 135–146)
Total Bilirubin: 0.3 mg/dL (ref 0.2–1.2)
Total Protein: 7.1 g/dL (ref 6.1–8.1)

## 2017-08-02 LAB — HEMOGLOBIN A1C
Hgb A1c MFr Bld: 9.1 % of total Hgb — ABNORMAL HIGH (ref <5.7)
Mean Plasma Glucose: 214 (calc)
eAG (mmol/L): 11.9 (calc)

## 2017-08-02 LAB — TSH: TSH: 3.69 mIU/L

## 2017-08-02 LAB — VITAMIN D 25 HYDROXY (VIT D DEFICIENCY, FRACTURES): Vit D, 25-Hydroxy: 31 ng/mL (ref 30–100)

## 2017-08-05 ENCOUNTER — Encounter: Payer: Self-pay | Admitting: "Endocrinology

## 2017-08-05 ENCOUNTER — Ambulatory Visit: Payer: Medicaid Other | Admitting: "Endocrinology

## 2017-08-05 VITALS — BP 168/84 | HR 94 | Ht 65.0 in | Wt 240.0 lb

## 2017-08-05 DIAGNOSIS — E782 Mixed hyperlipidemia: Secondary | ICD-10-CM

## 2017-08-05 DIAGNOSIS — E118 Type 2 diabetes mellitus with unspecified complications: Secondary | ICD-10-CM | POA: Diagnosis not present

## 2017-08-05 DIAGNOSIS — E039 Hypothyroidism, unspecified: Secondary | ICD-10-CM | POA: Diagnosis not present

## 2017-08-05 DIAGNOSIS — I10 Essential (primary) hypertension: Secondary | ICD-10-CM | POA: Diagnosis not present

## 2017-08-05 DIAGNOSIS — IMO0002 Reserved for concepts with insufficient information to code with codable children: Secondary | ICD-10-CM

## 2017-08-05 DIAGNOSIS — E1165 Type 2 diabetes mellitus with hyperglycemia: Secondary | ICD-10-CM | POA: Diagnosis not present

## 2017-08-05 DIAGNOSIS — Z794 Long term (current) use of insulin: Secondary | ICD-10-CM | POA: Diagnosis not present

## 2017-08-05 DIAGNOSIS — Z6838 Body mass index (BMI) 38.0-38.9, adult: Secondary | ICD-10-CM | POA: Diagnosis not present

## 2017-08-05 MED ORDER — METFORMIN HCL ER 500 MG PO TB24
500.0000 mg | ORAL_TABLET | Freq: Two times a day (BID) | ORAL | 3 refills | Status: DC
Start: 2017-08-05 — End: 2017-12-12

## 2017-08-05 MED ORDER — INSULIN GLARGINE 100 UNIT/ML SOLOSTAR PEN: 70.0000 [IU] | PEN_INJECTOR | Freq: Every day | SUBCUTANEOUS | 2 refills | Status: DC

## 2017-08-05 MED ORDER — LEVOTHYROXINE SODIUM 75 MCG PO TABS: 75.0000 ug | ORAL_TABLET | Freq: Every day | ORAL | 4 refills | Status: DC

## 2017-08-05 MED ORDER — INSULIN ASPART 100 UNIT/ML FLEXPEN: 22.0000 [IU] | PEN_INJECTOR | Freq: Three times a day (TID) | SUBCUTANEOUS | 2 refills | Status: DC

## 2017-08-05 NOTE — Progress Notes (Signed)
Subjective:    Patient ID: Sandy Valencia, female    DOB: June 23, 1960, PCP Sinda Du, MD   Past Medical History:  Diagnosis Date  . Arthritis   . Asthma   . Diabetes mellitus   . Fatty liver   . GERD (gastroesophageal reflux disease)   . Hypertension   . Sleep apnea    pt siad, "i had small amount but could not tolerate CPAP and they said it was ok since it was mild.   Past Surgical History:  Procedure Laterality Date  . ABDOMINAL HYSTERECTOMY    . CHOLECYSTECTOMY    . COLONOSCOPY N/A 09/22/2012   RMR: colonic polyps -removed as described above. tubular adenoma, next TCS 09/2017  . CYST EXCISION Left 07/19/2016   Procedure: CYST REMOVAL LEFT RING FINGER;  Surgeon: Carole Civil, MD;  Location: [facility];  Service: Orthopedics;  Laterality: Left;  . CYST REMOVAL LEG Right    foot  . ESOPHAGOGASTRODUODENOSCOPY N/A 10/21/2014   RMR: Small hiatal hernia; otherwise normal EGD status post passage of a Maloney dilator.   Marland Kitchen MALONEY DILATION N/A 10/21/2014   Procedure: [name] DILATION;  Surgeon: Daneil Dolin, MD;  Location: [facility];  Service: Endoscopy;  Laterality: N/A;  . MIDDLE EAR SURGERY Right    patched hole in ear drum   Social History   Socioeconomic History  . Marital status: Divorced    Spouse name: None  . Number of children: None  . Years of education: None  . Highest education level: None  Social Needs  . Financial resource strain: None  . Food insecurity - worry: None  . Food insecurity - inability: None  . Transportation needs - medical: None  . Transportation needs - non-medical: None  Occupational History  . None  Tobacco Use  . Smoking status: Never Smoker  . Smokeless tobacco: Never Used  Substance and Sexual Activity  . Alcohol use: No    Alcohol/week: 0.0 oz  . Drug use: No  . Sexual activity: Yes    Birth control/protection: Surgical  Other Topics Concern  . None  Social History Narrative  . None   Outpatient Encounter  Medications as of 08/05/2017  Medication Sig  . aspirin EC 81 MG tablet Take 81 mg by mouth daily.  . beclomethasone (QVAR) 80 MCG/ACT inhaler Inhale 2 puffs into the lungs 2 (two) times daily.  . Cholecalciferol (VITAMIN D-3) 1000 UNITS CAPS Take 1 capsule by mouth daily.  . diphenhydrAMINE (BENADRYL) 25 MG tablet Take 1 tablet (25 mg total) by mouth every 6 (six) hours. (Patient taking differently: Take 25 mg by mouth 2 (two) times daily as needed (ALLERGIC REACTIONS). )  . esomeprazole (NEXIUM) 40 MG packet Take 40 mg by mouth daily before breakfast.  . glucose blood (ACCU-CHEK COMPACT PLUS) test strip Use as instructed  . HYDROcodone-acetaminophen (NORCO) 7.5-325 MG tablet Take 1 tablet by mouth every 6 (six) hours as needed for moderate pain.  Marland Kitchen insulin aspart (NOVOLOG FLEXPEN) 100 UNIT/ML FlexPen Inject 22-28 Units into the skin 3 (three) times daily with meals.  . Insulin Glargine (LANTUS SOLOSTAR) 100 UNIT/ML Solostar Pen Inject 70 Units into the skin daily at 10 pm.  . Insulin Pen Needle (B-D ULTRAFINE III SHORT PEN) 31G X 8 MM MISC 1 each by Does not apply route as directed.  . Lancets MISC 1 each by Does not apply route as directed.  Marland Kitchen levothyroxine (SYNTHROID, LEVOTHROID) 75 MCG tablet Take 1 tablet (75  mcg total) by mouth daily before breakfast.  . lisinopril-hydrochlorothiazide (PRINZIDE,ZESTORETIC) 20-25 MG tablet TAKE ONE TABLET BY MOUTH EVERY DAY.  . Melatonin 5 MG TABS Take 5 mg by mouth at bedtime.  . metFORMIN (GLUCOPHAGE XR) 500 MG 24 hr tablet Take 1 tablet (500 mg total) by mouth 2 (two) times daily after a meal.  . pantoprazole (PROTONIX) 40 MG tablet TAKE ONE (1) TABLET EACH DAY  . pregabalin (LYRICA) 150 MG capsule Take 150 mg 2 (two) times daily by mouth.  . simvastatin (ZOCOR) 10 MG tablet TAKE ONE TABLET BY MOUTH EVERY NIGHT AT BEDTIME  . vitamin B-12 (CYANOCOBALAMIN) 500 MCG tablet Take 500 mcg by mouth daily.  . [DISCONTINUED] insulin aspart (NOVOLOG FLEXPEN) 100  UNIT/ML FlexPen Inject 20-26 Units into the skin 3 (three) times daily with meals.  . [DISCONTINUED] LANTUS SOLOSTAR 100 UNIT/ML Solostar Pen INJECT 60 UNITS SUBCUTANEOUSLY TWICE A DAY  . [DISCONTINUED] levothyroxine (SYNTHROID, LEVOTHROID) 50 MCG tablet Take 1 tablet (50 mcg total) daily before breakfast by mouth.  . [DISCONTINUED] metFORMIN (GLUCOPHAGE XR) 500 MG 24 hr tablet Take 1 tablet (500 mg total) daily with breakfast by mouth.   No facility-administered encounter medications on file as of 08/05/2017.    ALLERGIES: Allergies  Allergen Reactions  . Peanut-Containing Drug Products Anaphylaxis and Swelling   VACCINATION STATUS: There is no immunization history for the selected administration types on file for this patient.  Diabetes  She presents for her follow-up diabetic visit. She has type 2 diabetes mellitus. Her disease course has been worsening (She was diagnosed at approximate age of 57 years.). There are no hypoglycemic associated symptoms. Pertinent negatives for hypoglycemia include no confusion, headaches, pallor or seizures. Associated symptoms include polydipsia and polyuria. Pertinent negatives for diabetes include no chest pain and no polyphagia. There are no hypoglycemic complications. Symptoms are worsening. There are no diabetic complications. Risk factors for coronary artery disease include dyslipidemia, diabetes mellitus, hypertension, sedentary lifestyle and tobacco exposure. Current diabetic treatment includes insulin injections. She is compliant with treatment most of the time. Her weight is increasing steadily. She is following a generally unhealthy diet. She never participates in exercise. Her home blood glucose trend is decreasing rapidly. Her breakfast blood glucose range is generally 180-200 mg/dl. Her lunch blood glucose range is generally 180-200 mg/dl. Her dinner blood glucose range is generally 180-200 mg/dl. Her overall blood glucose range is 180-200 mg/dl. An ACE  inhibitor/angiotensin II receptor blocker is being taken.  Hyperlipidemia  This is a chronic problem. The current episode started more than 1 year ago. The problem is uncontrolled. Exacerbating diseases include diabetes, hypothyroidism and obesity. Pertinent negatives include no chest pain, myalgias or shortness of breath. Current antihyperlipidemic treatment includes statins. Risk factors for coronary artery disease include diabetes mellitus, dyslipidemia, hypertension, obesity, a sedentary lifestyle and post-menopausal.  Hypertension  This is a chronic problem. The current episode started more than 1 year ago. The problem is uncontrolled. Pertinent negatives include no chest pain, headaches, palpitations or shortness of breath. Risk factors for coronary artery disease include dyslipidemia, diabetes mellitus, obesity, sedentary lifestyle and smoking/tobacco exposure. Past treatments include ACE inhibitors and diuretics.     Review of Systems  Constitutional: Negative for unexpected weight change.  HENT: Negative for trouble swallowing and voice change.   Eyes: Negative for visual disturbance.  Respiratory: Negative for cough, shortness of breath and wheezing.   Cardiovascular: Negative for chest pain, palpitations and leg swelling.  Gastrointestinal: Negative for diarrhea, nausea and vomiting.  Endocrine: Positive for polydipsia and polyuria. Negative for cold intolerance, heat intolerance and polyphagia.  Musculoskeletal: Negative for arthralgias and myalgias.  Skin: Negative for color change, pallor, rash and wound.  Neurological: Negative for seizures and headaches.  Psychiatric/Behavioral: Negative for confusion and suicidal ideas.    Objective:    BP (!) 168/84   Pulse 94   Ht 5\' 5"  (1.651 m)   Wt 240 lb (108.9 kg)   BMI 39.94 kg/m   Wt Readings from Last 3 Encounters:  08/05/17 240 lb (108.9 kg)  04/22/17 230 lb (104.3 kg)  12/31/16 236 lb 3.2 oz (107.1 kg)    Physical Exam   Constitutional: She is oriented to person, place, and time. She appears well-developed.  HENT:  Head: Normocephalic and atraumatic.  Eyes: EOM are normal.  Neck: Normal range of motion. Neck supple. No tracheal deviation present. No thyromegaly present.  Cardiovascular: Normal rate and regular rhythm.  Pulmonary/Chest: Effort normal and breath sounds normal.  Abdominal: Soft. Bowel sounds are normal. There is no tenderness. There is no guarding.  Musculoskeletal: Normal range of motion. She exhibits no edema.  Neurological: She is alert and oriented to person, place, and time. She has normal reflexes. No cranial nerve deficit. Coordination normal.  Skin: Skin is warm and dry. No rash noted. No erythema. No pallor.  Psychiatric: She has a normal mood and affect. Judgment normal.   Recent Results (from the past 2160 hour(s))  COMPLETE METABOLIC PANEL WITH GFR     Status: Abnormal   Collection Time: 08/01/17  7:03 AM  Result Value Ref Range   Glucose, Bld 161 (H) 65 - 99 mg/dL    Comment: .            Fasting reference interval . For someone without known diabetes, a glucose value >125 mg/dL indicates that they may have diabetes and this should be confirmed with a follow-up test. .    BUN 18 7 - 25 mg/dL   Creat 0.74 0.50 - 1.05 mg/dL    Comment: For patients >15 years of age, the reference limit for Creatinine is approximately 13% higher for people identified as African-American. .    GFR, Est Non African American 91 > OR = 60 mL/min/1.29m2   GFR, Est African American 106 > OR = 60 mL/min/1.17m2   BUN/Creatinine Ratio NOT APPLICABLE 6 - 22 (calc)   Sodium 137 135 - 146 mmol/L   Potassium 4.3 3.5 - 5.3 mmol/L   Chloride 102 98 - 110 mmol/L   CO2 29 20 - 32 mmol/L   Calcium 9.8 8.6 - 10.4 mg/dL   Total Protein 7.1 6.1 - 8.1 g/dL   Albumin 4.4 3.6 - 5.1 g/dL   Globulin 2.7 1.9 - 3.7 g/dL (calc)   AG Ratio 1.6 1.0 - 2.5 (calc)   Total Bilirubin 0.3 0.2 - 1.2 mg/dL    Alkaline phosphatase (APISO) 90 33 - 130 U/L   AST 20 10 - 35 U/L   ALT 28 6 - 29 U/L  Hemoglobin A1c     Status: Abnormal   Collection Time: 08/01/17  7:03 AM  Result Value Ref Range   Hgb A1c MFr Bld 9.1 (H) <5.7 % of total Hgb    Comment: For someone without known diabetes, a hemoglobin A1c value of 6.5% or greater indicates that they may have  diabetes and this should be confirmed with a follow-up  test. . For someone with known diabetes, a value <7% indicates  that  their diabetes is well controlled and a value  greater than or equal to 7% indicates suboptimal  control. A1c targets should be individualized based on  duration of diabetes, age, comorbid conditions, and  other considerations. . Currently, no consensus exists regarding use of hemoglobin A1c for diagnosis of diabetes for children. .    Mean Plasma Glucose 214 (calc)   eAG (mmol/L) 11.9 (calc)  T4, free     Status: None   Collection Time: 08/01/17  7:03 AM  Result Value Ref Range   Free T4 1.3 0.8 - 1.8 ng/dL  TSH     Status: None   Collection Time: 08/01/17  7:03 AM  Result Value Ref Range   TSH 3.69 mIU/L    Comment:           Reference Range .           > or = 20 Years  0.40-4.50 .                Pregnancy Ranges           First trimester    0.26-2.66           Second trimester   0.55-2.73           Third trimester    0.43-2.91   VITAMIN D 25 Hydroxy (Vit-D Deficiency, Fractures)     Status: None   Collection Time: 08/01/17  7:03 AM  Result Value Ref Range   Vit D, 25-Hydroxy 31 30 - 100 ng/mL    Comment: Vitamin D Status         25-OH Vitamin D: . Deficiency:                    <20 ng/mL Insufficiency:             20 - 29 ng/mL Optimal:                 > or = 30 ng/mL . For 25-OH Vitamin D testing on patients on  D2-supplementation and patients for whom quantitation  of D2 and D3 fractions is required, the QuestAssureD(TM) 25-OH VIT D, (D2,D3), LC/MS/MS is recommended: order  code 339 [id number] 8069  (patients >9yrs). . For more information on this test, go to: http://education.questdiagnostics.com/faq/FAQ163 (This link is being provided for  informational/educational purposes only.)      Assessment & Plan:   1. Uncontrolled type 2 diabetes mellitus with complication, with long-term current use of insulin ([place])  -Her  diabetes is  complicated by history of noncompliance (she recently showed better engagement) and patient remains at a high risk for more acute and chronic complications of diabetes which include CAD, CVA, CKD, retinopathy, and neuropathy. These are all discussed in detail with the patient. - Her average blood glucose is 180. Recent labs reviewed.  Patient came with A1c unchanged at 9.1% from 8.8% last visit.   - I have re-counseled the patient on diet management and weight loss  by adopting a carbohydrate restricted / protein rich  Diet.  -  Suggestion is made for her to avoid simple carbohydrates  from her diet including Cakes, Sweet Desserts / Pastries, Ice Cream, Soda (diet and regular), Sweet Tea, Candies, Chips, Cookies, Store Bought Juices, Alcohol in Excess of  1-2 drinks a day, Artificial Sweeteners, and "Sugar-free" Products. This will help patient to have stable blood glucose profile and potentially avoid unintended weight gain.  - Patient is advised to stick to a routine mealtimes to  eat 3 meals  a day and avoid unnecessary snacks ( to snack only to correct hypoglycemia).  - I have approached patient with the following individualized plan to manage diabetes and patient agrees.  -Based on her presentation with her higher A1c of 9.1% consistent with average blood glucose of 215 mg/dL, she is approached for higher dose of insulin.   -I will increase her Lantus to 70 units nightly, increase NovoLog to 22  units 3 times a day before meals plus correction. Pt to continue to monitor blood glucose 4 times a day-before meals and at bedtime.  - I advised her to  discontinue  Invokana. - She has tolerated low-dose metformin.  I would increase her metformin 500 mg ER to twice a day after breakfast and supper.   - Patient specific target  for A1c; LDL, HDL, Triglycerides, and  Waist Circumference were discussed in detail.  2) BP/HTN: Her blood pressure is not controlled, however much better.  I advised her to continue hydrochlorothiazide/lisinopril 25/20 mg by mouth daily. I counseled her on salt restriction.   3) Lipids/HPL: Controlled with LDL of 50 from November 2018, she is advised to continue statins. 4)  Weight/Diet: She has gained 10 pounds since last visit.  CDE consult in progress, exercise, and carbohydrates information provided. -She decided against bariatric surgery.    5. Personal history of noncompliance with medical treatment, presenting hazards to health -I counseled her on the need to stay focused and treat diabetes intensively to  avoid competitions.  5) Hypothyroidism:  -Based on her recent thyroid function tests, she will benefit from higher dose of levothyroxine.   -I discussed and increased her levothyroxine to 75 mcg p.o. every morning.     - We discussed about correct intake of levothyroxine, at fasting, with water, separated by at least 30 minutes from breakfast, and separated by more than 4 hours from calcium, iron, multivitamins, acid reflux medications (PPIs). -Patient is made aware of the fact that thyroid hormone replacement is needed for life, dose to be adjusted by periodic monitoring of thyroid function tests.  6) Chronic Care/Health Maintenance:  -Patient is on ACEI/ARB and Statin medications and encouraged to continue to follow up with Ophthalmology, Podiatrist at least yearly or according to recommendations, and advised to  stay away from smoking. I have recommended yearly flu vaccine and pneumonia vaccination at least every 5 years; moderate intensity exercise for up to 150 minutes weekly; and  sleep for at least 7  hours a day.  - I advised patient to maintain close follow up with Sinda Du, MD for primary care needs.  - Time spent with the patient: 25 min, of which >50% was spent in reviewing her blood glucose logs , discussing her hypo- and hyper-glycemic episodes, reviewing her current and  previous labs and insulin doses and developing a plan to avoid hypo- and hyper-glycemia. Please refer to Patient Instructions for Blood Glucose Monitoring and Insulin/Medications Dosing Guide"  in media tab for additional information.   Follow up plan: -Return in about 3 months (around 11/05/2017) for follow up with pre-visit labs, meter, and logs.  Glade Lloyd, MD Phone: [phone]  Fax: [phone]   -  This note was partially dictated with voice recognition software. Similar sounding words can be transcribed inadequately or may not  be corrected upon review.  08/05/2017, 11:35 AM

## 2017-08-05 NOTE — Patient Instructions (Signed)

## 2017-08-06 ENCOUNTER — Encounter: Payer: Self-pay | Admitting: Internal Medicine

## 2017-08-19 ENCOUNTER — Ambulatory Visit (INDEPENDENT_AMBULATORY_CARE_PROVIDER_SITE_OTHER): Payer: Medicaid Other | Admitting: Otolaryngology

## 2017-08-19 DIAGNOSIS — H95121 Granulation of postmastoidectomy cavity, right ear: Secondary | ICD-10-CM | POA: Diagnosis not present

## 2017-08-26 ENCOUNTER — Telehealth: Payer: Self-pay

## 2017-08-26 ENCOUNTER — Other Ambulatory Visit: Payer: Self-pay

## 2017-08-26 ENCOUNTER — Ambulatory Visit: Payer: Medicaid Other | Admitting: Gastroenterology

## 2017-08-26 ENCOUNTER — Encounter: Payer: Self-pay | Admitting: Gastroenterology

## 2017-08-26 DIAGNOSIS — Z860100 Personal history of colon polyps, unspecified: Secondary | ICD-10-CM

## 2017-08-26 HISTORY — DX: Personal history of colonic polyps: Z86.010

## 2017-08-26 HISTORY — DX: Personal history of colon polyps, unspecified: Z86.0100

## 2017-08-26 MED ORDER — CLENPIQ 10-3.5-12 MG-GM -GM/160ML PO SOLN: 1.0000 | Freq: Once | ORAL | 0 refills | Status: AC

## 2017-08-26 NOTE — Telephone Encounter (Signed)
Tried to call pt to inform of pre-op appt 09/17/17 at 10:00am, no answer, LMOVM. Letter mailed.

## 2017-08-26 NOTE — Progress Notes (Signed)
Primary Care Physician:  Sinda Du, MD Primary GI: Dr. Gala Romney   Chief Complaint  Patient presents with  . Colonoscopy    due for 5 yr tcs    HPI:   Sandy Valencia is a 57 y.o. female presenting today with a history of adenomas, needs surveillance now. Her mother had colon cancer but was diagnosed at age 68. History of fatty liver with most recent LFTs normal.   She has no concerning lower or upper GI symptoms. She is taking Protonix once daily. No dysphagia, abdominal pain, constipation, diarrhea.   Past Medical History:  Diagnosis Date  . Arthritis   . Asthma   . Diabetes mellitus   . Fatty liver   . GERD (gastroesophageal reflux disease)   . Hypertension   . Sleep apnea    pt siad, "i had small amount but could not tolerate CPAP and they said it was ok since it was mild.    Past Surgical History:  Procedure Laterality Date  . ABDOMINAL HYSTERECTOMY    . CHOLECYSTECTOMY    . COLONOSCOPY N/A 09/22/2012   RMR: colonic polyps -removed as described above. tubular adenoma, next TCS 09/2017  . CYST EXCISION Left 07/19/2016   Procedure: CYST REMOVAL LEFT RING FINGER;  Surgeon: Carole Civil, MD;  Location: [facility];  Service: Orthopedics;  Laterality: Left;  . CYST REMOVAL LEG Right    foot  . ESOPHAGOGASTRODUODENOSCOPY N/A 10/21/2014   RMR: Small hiatal hernia; otherwise normal EGD status post passage of a Maloney dilator.   Marland Kitchen MALONEY DILATION N/A 10/21/2014   Procedure: [name] DILATION;  Surgeon: Daneil Dolin, MD;  Location: [facility];  Service: Endoscopy;  Laterality: N/A;  . MIDDLE EAR SURGERY Right    patched hole in ear drum    Current Outpatient Medications  Medication Sig Dispense Refill  . aspirin EC 81 MG tablet Take 81 mg by mouth daily.    . Cholecalciferol (VITAMIN D-3) 1000 UNITS CAPS Take 1 capsule by mouth daily.    . fluticasone (FLOVENT HFA) 220 MCG/ACT inhaler Inhale into the lungs 2 (two) times daily.    Marland Kitchen glucose blood (ACCU-CHEK COMPACT  PLUS) test strip Use as instructed 150 each 3  . HYDROcodone-acetaminophen (NORCO) 7.5-325 MG tablet Take 1 tablet by mouth every 6 (six) hours as needed for moderate pain. 20 tablet 0  . insulin aspart (NOVOLOG FLEXPEN) 100 UNIT/ML FlexPen Inject 22-28 Units into the skin 3 (three) times daily with meals. (Patient taking differently: Inject 17-25 Units into the skin 3 (three) times daily with meals. ) 30 mL 2  . Insulin Glargine (LANTUS SOLOSTAR) 100 UNIT/ML Solostar Pen Inject 70 Units into the skin daily at 10 pm. 30 mL 2  . Insulin Pen Needle (B-D ULTRAFINE III SHORT PEN) 31G X 8 MM MISC 1 each by Does not apply route as directed. 150 each 3  . Lancets MISC 1 each by Does not apply route as directed. 150 each 3  . levothyroxine (SYNTHROID, LEVOTHROID) 75 MCG tablet Take 1 tablet (75 mcg total) by mouth daily before breakfast. 30 tablet 4  . lisinopril-hydrochlorothiazide (PRINZIDE,ZESTORETIC) 20-25 MG tablet TAKE ONE TABLET BY MOUTH EVERY DAY. 30 tablet 2  . Melatonin 5 MG TABS Take 5 mg by mouth at bedtime.    . metFORMIN (GLUCOPHAGE XR) 500 MG 24 hr tablet Take 1 tablet (500 mg total) by mouth 2 (two) times daily after a meal. 60 tablet 3  . pantoprazole (PROTONIX)  40 MG tablet TAKE ONE (1) TABLET EACH DAY 30 tablet 1  . simvastatin (ZOCOR) 10 MG tablet TAKE ONE TABLET BY MOUTH EVERY NIGHT AT BEDTIME 90 tablet 3  . vitamin B-12 (CYANOCOBALAMIN) 500 MCG tablet Take 500 mcg by mouth daily.     No current facility-administered medications for this visit.     Allergies as of 08/26/2017 - Review Complete 08/26/2017  Allergen Reaction Noted  . Peanut-containing drug products Anaphylaxis and Swelling 07/11/2016    Family History  Problem Relation Age of Onset  . Colon cancer Mother        diagnosed age 16, underwent surgical resection, metastatic disease several years later  . Diabetes Mother   . Heart attack Father   . Diabetes Sister   . Hypothyroidism Brother   . Diabetes Sister   .  Diabetes Sister   . Diabetes Sister   . Hypothyroidism Sister     Social History   Socioeconomic History  . Marital status: Divorced    Spouse name: Not on file  . Number of children: Not on file  . Years of education: Not on file  . Highest education level: Not on file  Occupational History  . Not on file  Social Needs  . Financial resource strain: Not on file  . Food insecurity:    Worry: Not on file    Inability: Not on file  . Transportation needs:    Medical: Not on file    Non-medical: Not on file  Tobacco Use  . Smoking status: Never Smoker  . Smokeless tobacco: Never Used  Substance and Sexual Activity  . Alcohol use: No    Alcohol/week: 0.0 oz  . Drug use: No  . Sexual activity: Yes    Birth control/protection: Surgical  Lifestyle  . Physical activity:    Days per week: Not on file    Minutes per session: Not on file  . Stress: Not on file  Relationships  . Social connections:    Talks on phone: Not on file    Gets together: Not on file    Attends religious service: Not on file    Active member of club or organization: Not on file    Attends meetings of clubs or organizations: Not on file    Relationship status: Not on file  Other Topics Concern  . Not on file  Social History Narrative  . Not on file    Review of Systems: Gen: Denies fever, chills, anorexia. Denies fatigue, weakness, weight loss.  CV: Denies chest pain, palpitations, syncope, peripheral edema, and claudication. Resp: Denies dyspnea at rest, cough, wheezing, coughing up blood, and pleurisy. GI: see HPI  Derm: Denies rash, itching, dry skin Psych: Denies depression, anxiety, memory loss, confusion. No homicidal or suicidal ideation.  Heme: Denies bruising, bleeding, and enlarged lymph nodes.  Physical Exam: BP (!) 160/89   Pulse 86   Temp 98 F (36.7 C) (Oral)   Ht 5\' 5"  (1.651 m)   Wt 242 lb 12.8 oz (110.1 kg)   BMI 40.40 kg/m  General:   Alert and oriented. No distress  noted. Pleasant and cooperative.  Head:  Normocephalic and atraumatic. Eyes:  Conjuctiva clear without scleral icterus. Mouth:  Oral mucosa pink and moist.  Lungs: Clear to auscultation bilaterally Cardiac: S1 S2 present without murmurs  Abdomen:  +BS, soft, obese, non-tender and non-distended. No rebound or guarding. No HSM or masses noted. Msk:  Symmetrical without gross deformities. Normal posture. Extremities:  Without edema. Neurologic:  Alert and  oriented x4 Psych:  Alert and cooperative. Normal mood and affect.

## 2017-08-26 NOTE — H&P (View-Only) (Signed)
Primary Care Physician:  Sinda Du, MD Primary GI: Dr. Gala Romney   Chief Complaint  Patient presents with  . Colonoscopy    due for 5 yr tcs    HPI:   Sandy Valencia is a 57 y.o. female presenting today with a history of adenomas, needs surveillance now. Her mother had colon cancer but was diagnosed at age 65. History of fatty liver with most recent LFTs normal.   She has no concerning lower or upper GI symptoms. She is taking Protonix once daily. No dysphagia, abdominal pain, constipation, diarrhea.   Past Medical History:  Diagnosis Date  . Arthritis   . Asthma   . Diabetes mellitus   . Fatty liver   . GERD (gastroesophageal reflux disease)   . Hypertension   . Sleep apnea    pt siad, "i had small amount but could not tolerate CPAP and they said it was ok since it was mild.    Past Surgical History:  Procedure Laterality Date  . ABDOMINAL HYSTERECTOMY    . CHOLECYSTECTOMY    . COLONOSCOPY N/A 09/22/2012   RMR: colonic polyps -removed as described above. tubular adenoma, next TCS 09/2017  . CYST EXCISION Left 07/19/2016   Procedure: CYST REMOVAL LEFT RING FINGER;  Surgeon: Carole Civil, MD;  Location: [facility];  Service: Orthopedics;  Laterality: Left;  . CYST REMOVAL LEG Right    foot  . ESOPHAGOGASTRODUODENOSCOPY N/A 10/21/2014   RMR: Small hiatal hernia; otherwise normal EGD status post passage of a Maloney dilator.   Marland Kitchen MALONEY DILATION N/A 10/21/2014   Procedure: [name] DILATION;  Surgeon: Daneil Dolin, MD;  Location: [facility];  Service: Endoscopy;  Laterality: N/A;  . MIDDLE EAR SURGERY Right    patched hole in ear drum    Current Outpatient Medications  Medication Sig Dispense Refill  . aspirin EC 81 MG tablet Take 81 mg by mouth daily.    . Cholecalciferol (VITAMIN D-3) 1000 UNITS CAPS Take 1 capsule by mouth daily.    . fluticasone (FLOVENT HFA) 220 MCG/ACT inhaler Inhale into the lungs 2 (two) times daily.    Marland Kitchen glucose blood (ACCU-CHEK COMPACT  PLUS) test strip Use as instructed 150 each 3  . HYDROcodone-acetaminophen (NORCO) 7.5-325 MG tablet Take 1 tablet by mouth every 6 (six) hours as needed for moderate pain. 20 tablet 0  . insulin aspart (NOVOLOG FLEXPEN) 100 UNIT/ML FlexPen Inject 22-28 Units into the skin 3 (three) times daily with meals. (Patient taking differently: Inject 17-25 Units into the skin 3 (three) times daily with meals. ) 30 mL 2  . Insulin Glargine (LANTUS SOLOSTAR) 100 UNIT/ML Solostar Pen Inject 70 Units into the skin daily at 10 pm. 30 mL 2  . Insulin Pen Needle (B-D ULTRAFINE III SHORT PEN) 31G X 8 MM MISC 1 each by Does not apply route as directed. 150 each 3  . Lancets MISC 1 each by Does not apply route as directed. 150 each 3  . levothyroxine (SYNTHROID, LEVOTHROID) 75 MCG tablet Take 1 tablet (75 mcg total) by mouth daily before breakfast. 30 tablet 4  . lisinopril-hydrochlorothiazide (PRINZIDE,ZESTORETIC) 20-25 MG tablet TAKE ONE TABLET BY MOUTH EVERY DAY. 30 tablet 2  . Melatonin 5 MG TABS Take 5 mg by mouth at bedtime.    . metFORMIN (GLUCOPHAGE XR) 500 MG 24 hr tablet Take 1 tablet (500 mg total) by mouth 2 (two) times daily after a meal. 60 tablet 3  . pantoprazole (PROTONIX)  40 MG tablet TAKE ONE (1) TABLET EACH DAY 30 tablet 1  . simvastatin (ZOCOR) 10 MG tablet TAKE ONE TABLET BY MOUTH EVERY NIGHT AT BEDTIME 90 tablet 3  . vitamin B-12 (CYANOCOBALAMIN) 500 MCG tablet Take 500 mcg by mouth daily.     No current facility-administered medications for this visit.     Allergies as of 08/26/2017 - Review Complete 08/26/2017  Allergen Reaction Noted  . Peanut-containing drug products Anaphylaxis and Swelling 07/11/2016    Family History  Problem Relation Age of Onset  . Colon cancer Mother        diagnosed age 22, underwent surgical resection, metastatic disease several years later  . Diabetes Mother   . Heart attack Father   . Diabetes Sister   . Hypothyroidism Brother   . Diabetes Sister   .  Diabetes Sister   . Diabetes Sister   . Hypothyroidism Sister     Social History   Socioeconomic History  . Marital status: Divorced    Spouse name: Not on file  . Number of children: Not on file  . Years of education: Not on file  . Highest education level: Not on file  Occupational History  . Not on file  Social Needs  . Financial resource strain: Not on file  . Food insecurity:    Worry: Not on file    Inability: Not on file  . Transportation needs:    Medical: Not on file    Non-medical: Not on file  Tobacco Use  . Smoking status: Never Smoker  . Smokeless tobacco: Never Used  Substance and Sexual Activity  . Alcohol use: No    Alcohol/week: 0.0 oz  . Drug use: No  . Sexual activity: Yes    Birth control/protection: Surgical  Lifestyle  . Physical activity:    Days per week: Not on file    Minutes per session: Not on file  . Stress: Not on file  Relationships  . Social connections:    Talks on phone: Not on file    Gets together: Not on file    Attends religious service: Not on file    Active member of club or organization: Not on file    Attends meetings of clubs or organizations: Not on file    Relationship status: Not on file  Other Topics Concern  . Not on file  Social History Narrative  . Not on file    Review of Systems: Gen: Denies fever, chills, anorexia. Denies fatigue, weakness, weight loss.  CV: Denies chest pain, palpitations, syncope, peripheral edema, and claudication. Resp: Denies dyspnea at rest, cough, wheezing, coughing up blood, and pleurisy. GI: see HPI  Derm: Denies rash, itching, dry skin Psych: Denies depression, anxiety, memory loss, confusion. No homicidal or suicidal ideation.  Heme: Denies bruising, bleeding, and enlarged lymph nodes.  Physical Exam: BP (!) 160/89   Pulse 86   Temp 98 F (36.7 C) (Oral)   Ht 5\' 5"  (1.651 m)   Wt 242 lb 12.8 oz (110.1 kg)   BMI 40.40 kg/m  General:   Alert and oriented. No distress  noted. Pleasant and cooperative.  Head:  Normocephalic and atraumatic. Eyes:  Conjuctiva clear without scleral icterus. Mouth:  Oral mucosa pink and moist.  Lungs: Clear to auscultation bilaterally Cardiac: S1 S2 present without murmurs  Abdomen:  +BS, soft, obese, non-tender and non-distended. No rebound or guarding. No HSM or masses noted. Msk:  Symmetrical without gross deformities. Normal posture. Extremities:  Without edema. Neurologic:  Alert and  oriented x4 Psych:  Alert and cooperative. Normal mood and affect.

## 2017-08-26 NOTE — Patient Instructions (Signed)
We have scheduled you for a colonoscopy with Dr. Gala Romney in the near future.  Take only 1/2 dose of Lantus the evening before and no diabetes medication the day of the procedure.  It was good to see you again!  It was a pleasure to see you today. I strive to create trusting relationships with patients to provide genuine, compassionate, and quality care. I value your feedback. If you receive a survey regarding your visit,  I greatly appreciate you taking time to fill this out.   Annitta Needs, PhD, ANP-BC [facility]

## 2017-08-27 ENCOUNTER — Ambulatory Visit: Payer: Medicaid Other

## 2017-08-28 NOTE — Assessment & Plan Note (Signed)
57 year old female due for surveillance colonoscopy, with last evaluation in 2014. No concerning lower or upper GI symptoms currently.  Proceed with TCS with Dr. Gala Romney in near future: the risks, benefits, and alternatives have been discussed with the patient in detail. The patient states understanding and desires to proceed. Propofol due to polypharmacy Take only 1/2 dose of Lantus the evening before and no diabetes medication the day of the procedure.

## 2017-08-28 NOTE — Progress Notes (Signed)
cc'ed to pcp °

## 2017-09-09 ENCOUNTER — Other Ambulatory Visit: Payer: Self-pay | Admitting: "Endocrinology

## 2017-09-10 MED ORDER — LEVOTHYROXINE SODIUM 75 MCG PO TABS
75.0000 ug | ORAL_TABLET | Freq: Every day | ORAL | 4 refills | Status: DC
Start: 2017-09-10 — End: 2018-06-17

## 2017-09-16 NOTE — Patient Instructions (Signed)
Sandy Valencia  09/16/2017     @PREFPERIOPPHARMACY @   Your procedure is scheduled on  09/23/2017 .  Report to [facility] at  615   A.M.  Call this number if you have problems the morning of surgery:  [phone]   Remember:  Do not eat food or drink liquids after midnight.  Take these medicines the morning of surgery with A SIP OF WATER  Hydrocodone, levothyroxine, lisinopril, protonix.   Do not wear jewelry, make-up or nail polish.  Do not wear lotions, powders, or perfumes, or deodorant.  Do not shave 48 hours prior to surgery.  Men may shave face and neck.  Do not bring valuables to the hospital.  [facility] is not responsible for any belongings or valuables.  Contacts, dentures or bridgework may not be worn into surgery.  Leave your suitcase in the car.  After surgery it may be brought to your room.  For patients admitted to the hospital, discharge time will be determined by your treatment team.  Patients discharged the day of surgery will not be allowed to drive home.   Name and phone number of your driver:   family Special instructions:  Follow the diet and prep instructions given to you by Dr Roseanne Kaufman office.  Please read over the following fact sheets that you were given. Anesthesia Post-op Instructions and Care and Recovery After Surgery       Colonoscopy, Adult A colonoscopy is an exam to look at the large intestine. It is done to check for problems, such as:  Lumps (tumors).  Growths (polyps).  Swelling (inflammation).  Bleeding.  What happens before the procedure? Eating and drinking Follow instructions from your doctor about eating and drinking. These instructions may include:  A few days before the procedure - follow a low-fiber diet. ? Avoid nuts. ? Avoid seeds. ? Avoid dried fruit. ? Avoid raw fruits. ? Avoid vegetables.  1-3 days before the procedure - follow a clear liquid diet. Avoid liquids that have red or purple dye.  Drink only clear liquids, such as: ? Clear broth or bouillon. ? Black coffee or tea. ? Clear juice. ? Clear soft drinks or sports drinks. ? Gelatin dessert. ? Popsicles.  On the day of the procedure - do not eat or drink anything during the 2 hours before the procedure.  Bowel prep If you were prescribed an oral bowel prep:  Take it as told by your doctor. Starting the day before your procedure, you will need to drink a lot of liquid. The liquid will cause you to poop (have bowel movements) until your poop is almost clear or light green.  If your skin or butt gets irritated from diarrhea, you may: ? Wipe the area with wipes that have medicine in them, such as adult wet wipes with aloe and vitamin E. ? Put something on your skin that soothes the area, such as petroleum jelly.  If you throw up (vomit) while drinking the bowel prep, take a break for up to 60 minutes. Then begin the bowel prep again. If you keep throwing up and you cannot take the bowel prep without throwing up, call your doctor.  General instructions  Ask your doctor about changing or stopping your normal medicines. This is important if you take diabetes medicines or blood thinners.  Plan to have someone take you home from the hospital or clinic. What happens during the procedure?  An IV  tube may be put into one of your veins.  You will be given medicine to help you relax (sedative).  To reduce your risk of infection: ? Your doctors will wash their hands. ? Your anal area will be washed with soap.  You will be asked to lie on your side with your knees bent.  Your doctor will get a long, thin, flexible tube ready. The tube will have a camera and a light on the end.  The tube will be put into your anus.  The tube will be gently put into your large intestine.  Air will be delivered into your large intestine to keep it open. You may feel some pressure or cramping.  The camera will be used to take photos.  A  small tissue sample may be removed from your body to be looked at under a microscope (biopsy). If any possible problems are found, the tissue will be sent to a lab for testing.  If small growths are found, your doctor may remove them and have them checked for cancer.  The tube that was put into your anus will be slowly removed. The procedure may vary among doctors and hospitals. What happens after the procedure?  Your doctor will check on you often until the medicines you were given have worn off.  Do not drive for 24 hours after the procedure.  You may have a small amount of blood in your poop.  You may pass gas.  You may have mild cramps or bloating in your belly (abdomen).  It is up to you to get the results of your procedure. Ask your doctor, or the department performing the procedure, when your results will be ready. This information is not intended to replace advice given to you by your health care provider. Make sure you discuss any questions you have with your health care provider. Document Released: 06/23/2010 Document Revised: 03/21/2016 Document Reviewed: 08/02/2015 Elsevier Interactive Patient Education  2017 [employer]vier Inc[employer]  Colonoscopy, Adult, Care After This sheet gives you information about how to care for yourself after your procedure. Your health care provider may also give you more specific instructions. If you have problems or questions, contact your health care provider. What can I expect after the procedure? After the procedure, it is common to have:  A small amount of blood in your stool for 24 hours after the procedure.  Some gas.  Mild abdominal cramping or bloating.  Follow these instructions at home: General instructions   For the first 24 hours after the procedure: ? Do not drive or use machinery. ? Do not sign important documents. ? Do not drink alcohol. ? Do your regular daily activities at a slower pace than normal. ? Eat soft, easy-to-digest  foods. ? Rest often.  Take over-the-counter or prescription medicines only as told by your health care provider.  It is up to you to get the results of your procedure. Ask your health care provider, or the department performing the procedure, when your results will be ready. Relieving cramping and bloating  Try walking around when you have cramps or feel bloated.  Apply heat to your abdomen as told by your health care provider. Use a heat source that your health care provider recommends, such as a moist heat pack or a heating pad. ? Place a towel between your skin and the heat source. ? Leave the heat on for 20-30 minutes. ? Remove the heat if your skin turns bright red. This is especially important  if you are unable to feel pain, heat, or cold. You may have a greater risk of getting burned. Eating and drinking  Drink enough fluid to keep your urine clear or pale yellow.  Resume your normal diet as instructed by your health care provider. Avoid heavy or fried foods that are hard to digest.  Avoid drinking alcohol for as long as instructed by your health care provider. Contact a health care provider if:  You have blood in your stool 2-3 days after the procedure. Get help right away if:  You have more than a small spotting of blood in your stool.  You pass large blood clots in your stool.  Your abdomen is swollen.  You have nausea or vomiting.  You have a fever.  You have increasing abdominal pain that is not relieved with medicine. This information is not intended to replace advice given to you by your health care provider. Make sure you discuss any questions you have with your health care provider. Document Released: 01/03/2004 Document Revised: 02/13/2016 Document Reviewed: 08/02/2015 Elsevier Interactive Patient Education  2018 [place] Anesthesia is a term that refers to techniques, procedures, and medicines that help a person stay safe  and comfortable during a medical procedure. Monitored anesthesia care, or sedation, is one type of anesthesia. Your anesthesia specialist may recommend sedation if you will be having a procedure that does not require you to be unconscious, such as:  Cataract surgery.  A dental procedure.  A biopsy.  A colonoscopy.  During the procedure, you may receive a medicine to help you relax (sedative). There are three levels of sedation:  Mild sedation. At this level, you may feel awake and relaxed. You will be able to follow directions.  Moderate sedation. At this level, you will be sleepy. You may not remember the procedure.  Deep sedation. At this level, you will be asleep. You will not remember the procedure.  The more medicine you are given, the deeper your level of sedation will be. Depending on how you respond to the procedure, the anesthesia specialist may change your level of sedation or the type of anesthesia to fit your needs. An anesthesia specialist will monitor you closely during the procedure. Let your health care provider know about:  Any allergies you have.  All medicines you are taking, including vitamins, herbs, eye drops, creams, and over-the-counter medicines.  Any use of steroids (by mouth or as a cream).  Any problems you or family members have had with sedatives and anesthetic medicines.  Any blood disorders you have.  Any surgeries you have had.  Any medical conditions you have, such as sleep apnea.  Whether you are pregnant or may be pregnant.  Any use of cigarettes, alcohol, or street drugs. What are the risks? Generally, this is a safe procedure. However, problems may occur, including:  Getting too much medicine (oversedation).  Nausea.  Allergic reaction to medicines.  Trouble breathing. If this happens, a breathing tube may be used to help with breathing. It will be removed when you are awake and breathing on your own.  Heart trouble.  Lung  trouble.  Before the procedure Staying hydrated Follow instructions from your health care provider about hydration, which may include:  Up to 2 hours before the procedure - you may continue to drink clear liquids, such as water, clear fruit juice, black coffee, and plain tea.  Eating and drinking restrictions Follow instructions from your health care provider about eating  and drinking, which may include:  8 hours before the procedure - stop eating heavy meals or foods such as meat, fried foods, or fatty foods.  6 hours before the procedure - stop eating light meals or foods, such as toast or cereal.  6 hours before the procedure - stop drinking milk or drinks that contain milk.  2 hours before the procedure - stop drinking clear liquids.  Medicines Ask your health care provider about:  Changing or stopping your regular medicines. This is especially important if you are taking diabetes medicines or blood thinners.  Taking medicines such as aspirin and ibuprofen. These medicines can thin your blood. Do not take these medicines before your procedure if your health care provider instructs you not to.  Tests and exams  You will have a physical exam.  You may have blood tests done to show: ? How well your kidneys and liver are working. ? How well your blood can clot.  General instructions  Plan to have someone take you home from the hospital or clinic.  If you will be going home right after the procedure, plan to have someone with you for 24 hours.  What happens during the procedure?  Your blood pressure, heart rate, breathing, level of pain and overall condition will be monitored.  An IV tube will be inserted into one of your veins.  Your anesthesia specialist will give you medicines as needed to keep you comfortable during the procedure. This may mean changing the level of sedation.  The procedure will be performed. After the procedure  Your blood pressure, heart rate,  breathing rate, and blood oxygen level will be monitored until the medicines you were given have worn off.  Do not drive for 24 hours if you received a sedative.  You may: ? Feel sleepy, clumsy, or nauseous. ? Feel forgetful about what happened after the procedure. ? Have a sore throat if you had a breathing tube during the procedure. ? Vomit. This information is not intended to replace advice given to you by your health care provider. Make sure you discuss any questions you have with your health care provider. Document Released: 02/14/2005 Document Revised: 10/28/2015 Document Reviewed: 09/11/2015 Elsevier Interactive Patient Education  2018 [place], Care After These instructions provide you with information about caring for yourself after your procedure. Your health care provider may also give you more specific instructions. Your treatment has been planned according to current medical practices, but problems sometimes occur. Call your health care provider if you have any problems or questions after your procedure. What can I expect after the procedure? After your procedure, it is common to:  Feel sleepy for several hours.  Feel clumsy and have poor balance for several hours.  Feel forgetful about what happened after the procedure.  Have poor judgment for several hours.  Feel nauseous or vomit.  Have a sore throat if you had a breathing tube during the procedure.  Follow these instructions at home: For at least 24 hours after the procedure:   Do not: ? Participate in activities in which you could fall or become injured. ? Drive. ? Use heavy machinery. ? Drink alcohol. ? Take sleeping pills or medicines that cause drowsiness. ? Make important decisions or sign legal documents. ? Take care of children on your own.  Rest. Eating and drinking  Follow the diet that is recommended by your health care provider.  If you vomit, drink water,  juice, or soup  when you can drink without vomiting.  Make sure you have little or no nausea before eating solid foods. General instructions  Have a responsible adult stay with you until you are awake and alert.  Take over-the-counter and prescription medicines only as told by your health care provider.  If you smoke, do not smoke without supervision.  Keep all follow-up visits as told by your health care provider. This is important. Contact a health care provider if:  You keep feeling nauseous or you keep vomiting.  You feel light-headed.  You develop a rash.  You have a fever. Get help right away if:  You have trouble breathing. This information is not intended to replace advice given to you by your health care provider. Make sure you discuss any questions you have with your health care provider. Document Released: 09/11/2015 Document Revised: 01/11/2016 Document Reviewed: 09/11/2015 Elsevier Interactive Patient Education  [employer].

## 2017-09-17 ENCOUNTER — Encounter (HOSPITAL_COMMUNITY)
Admission: RE | Admit: 2017-09-17 | Discharge: 2017-09-17 | Disposition: A | Discharge status: Home / Self Care | Payer: Medicaid Other | Source: Ambulatory Visit | Attending: Internal Medicine | Admitting: Internal Medicine

## 2017-09-17 ENCOUNTER — Encounter (HOSPITAL_COMMUNITY): Payer: Self-pay

## 2017-09-17 ENCOUNTER — Other Ambulatory Visit: Payer: Self-pay

## 2017-09-17 DIAGNOSIS — Z01810 Encounter for preprocedural cardiovascular examination: Secondary | ICD-10-CM | POA: Diagnosis not present

## 2017-09-17 DIAGNOSIS — Z01812 Encounter for preprocedural laboratory examination: Secondary | ICD-10-CM | POA: Diagnosis present

## 2017-09-17 HISTORY — DX: Pure hypercholesterolemia, unspecified: E78.00

## 2017-09-17 HISTORY — DX: Hypothyroidism, unspecified: E03.9

## 2017-09-17 LAB — BASIC METABOLIC PANEL
Anion gap: 13 (ref 5–15)
BUN: 15 mg/dL (ref 6–20)
CHLORIDE: 102 mmol/L (ref 101–111)
CO2: 25 mmol/L (ref 22–32)
CREATININE: 0.75 mg/dL (ref 0.44–1.00)
Calcium: 9.3 mg/dL (ref 8.9–10.3)
GFR calc non Af Amer: >60 mL/min (ref >60)
Glucose, Bld: 131 mg/dL — ABNORMAL HIGH (ref 65–99)
POTASSIUM: 4.1 mmol/L (ref 3.5–5.1)
SODIUM: 140 mmol/L (ref 135–145)

## 2017-09-17 LAB — CBC WITH DIFFERENTIAL/PLATELET
BASOS PCT: 1 %
Basophils Absolute: 0 10*3/uL (ref 0.0–0.1)
Eosinophils Absolute: 0.1 10*3/uL (ref 0.0–0.7)
Eosinophils Relative: 2 %
HEMATOCRIT: 42.7 % (ref 36.0–46.0)
HEMOGLOBIN: 13.2 g/dL (ref 12.0–15.0)
LYMPHS ABS: 1.6 10*3/uL (ref 0.7–4.0)
Lymphocytes Relative: 30 %
MCH: 28.3 pg (ref 26.0–34.0)
MCHC: 30.9 g/dL (ref 30.0–36.0)
MCV: 91.6 fL (ref 78.0–100.0)
MONOS PCT: 8 %
Monocytes Absolute: 0.4 10*3/uL (ref 0.1–1.0)
NEUTROS ABS: 3.2 10*3/uL (ref 1.7–7.7)
NEUTROS PCT: 59 %
Platelets: 246 10*3/uL (ref 150–400)
RBC: 4.66 MIL/uL (ref 3.87–5.11)
RDW: 13.9 % (ref 11.5–15.5)
WBC: 5.3 10*3/uL (ref 4.0–10.5)

## 2017-09-23 ENCOUNTER — Encounter (HOSPITAL_COMMUNITY): Payer: Self-pay | Admitting: *Deleted

## 2017-09-23 ENCOUNTER — Ambulatory Visit (HOSPITAL_COMMUNITY)
Admission: RE | Admit: 2017-09-23 | Discharge: 2017-09-23 | Disposition: A | Discharge status: Home / Self Care | Payer: Medicaid Other | Source: Ambulatory Visit | Attending: Internal Medicine | Admitting: Internal Medicine

## 2017-09-23 ENCOUNTER — Ambulatory Visit (HOSPITAL_COMMUNITY): Payer: Medicaid Other | Admitting: Anesthesiology

## 2017-09-23 ENCOUNTER — Other Ambulatory Visit: Payer: Self-pay

## 2017-09-23 ENCOUNTER — Encounter (HOSPITAL_COMMUNITY)
Admission: RE | Disposition: A | Discharge status: Home / Self Care | Payer: Self-pay | Source: Ambulatory Visit | Attending: Internal Medicine

## 2017-09-23 DIAGNOSIS — K621 Rectal polyp: Secondary | ICD-10-CM | POA: Diagnosis not present

## 2017-09-23 DIAGNOSIS — Z6841 Body Mass Index (BMI) 40.0 and over, adult: Secondary | ICD-10-CM | POA: Diagnosis not present

## 2017-09-23 DIAGNOSIS — Z7951 Long term (current) use of inhaled steroids: Secondary | ICD-10-CM | POA: Insufficient documentation

## 2017-09-23 DIAGNOSIS — K219 Gastro-esophageal reflux disease without esophagitis: Secondary | ICD-10-CM | POA: Insufficient documentation

## 2017-09-23 DIAGNOSIS — E039 Hypothyroidism, unspecified: Secondary | ICD-10-CM | POA: Insufficient documentation

## 2017-09-23 DIAGNOSIS — K5730 Diverticulosis of large intestine without perforation or abscess without bleeding: Secondary | ICD-10-CM | POA: Insufficient documentation

## 2017-09-23 DIAGNOSIS — I10 Essential (primary) hypertension: Secondary | ICD-10-CM | POA: Insufficient documentation

## 2017-09-23 DIAGNOSIS — K760 Fatty (change of) liver, not elsewhere classified: Secondary | ICD-10-CM | POA: Diagnosis not present

## 2017-09-23 DIAGNOSIS — Z86010 Personal history of colonic polyps: Secondary | ICD-10-CM | POA: Insufficient documentation

## 2017-09-23 DIAGNOSIS — Z79899 Other long term (current) drug therapy: Secondary | ICD-10-CM | POA: Insufficient documentation

## 2017-09-23 DIAGNOSIS — Z7982 Long term (current) use of aspirin: Secondary | ICD-10-CM | POA: Diagnosis not present

## 2017-09-23 DIAGNOSIS — G4730 Sleep apnea, unspecified: Secondary | ICD-10-CM | POA: Insufficient documentation

## 2017-09-23 DIAGNOSIS — Z794 Long term (current) use of insulin: Secondary | ICD-10-CM | POA: Diagnosis not present

## 2017-09-23 DIAGNOSIS — Z1211 Encounter for screening for malignant neoplasm of colon: Secondary | ICD-10-CM | POA: Diagnosis not present

## 2017-09-23 DIAGNOSIS — K640 First degree hemorrhoids: Secondary | ICD-10-CM | POA: Diagnosis not present

## 2017-09-23 DIAGNOSIS — E119 Type 2 diabetes mellitus without complications: Secondary | ICD-10-CM | POA: Diagnosis not present

## 2017-09-23 DIAGNOSIS — Z79890 Hormone replacement therapy (postmenopausal): Secondary | ICD-10-CM | POA: Diagnosis not present

## 2017-09-23 DIAGNOSIS — D123 Benign neoplasm of transverse colon: Secondary | ICD-10-CM | POA: Diagnosis not present

## 2017-09-23 DIAGNOSIS — J45909 Unspecified asthma, uncomplicated: Secondary | ICD-10-CM | POA: Insufficient documentation

## 2017-09-23 DIAGNOSIS — Z800 Family history of malignant neoplasm of digestive organs: Secondary | ICD-10-CM | POA: Diagnosis not present

## 2017-09-23 HISTORY — PX: POLYPECTOMY: SHX5525

## 2017-09-23 HISTORY — PX: COLONOSCOPY WITH PROPOFOL: SHX5780

## 2017-09-23 LAB — GLUCOSE, CAPILLARY
GLUCOSE-CAPILLARY: 159 mg/dL — AB (ref 65–99)
GLUCOSE-CAPILLARY: 130 mg/dL — AB (ref 65–99)

## 2017-09-23 LAB — HM COLONOSCOPY

## 2017-09-23 SURGERY — COLONOSCOPY WITH PROPOFOL
Anesthesia: General

## 2017-09-23 MED ORDER — LIDOCAINE HCL (CARDIAC) PF 50 MG/5ML IV SOSY
PREFILLED_SYRINGE | INTRAVENOUS | Status: DC | PRN
  Administered 2017-09-23: 30 mg via INTRAVENOUS

## 2017-09-23 MED ORDER — MIDAZOLAM HCL 2 MG/2ML IJ SOLN
INTRAMUSCULAR | Status: AC
  Filled 2017-09-23: qty 4

## 2017-09-23 MED ORDER — LACTATED RINGERS IV SOLN: INTRAVENOUS | Status: DC

## 2017-09-23 MED ORDER — PROPOFOL 500 MG/50ML IV EMUL
INTRAVENOUS | Status: DC | PRN
  Administered 2017-09-23: 150 ug/kg/min via INTRAVENOUS
  Administered 2017-09-23: 08:00:00 via INTRAVENOUS

## 2017-09-23 MED ORDER — LIDOCAINE HCL (PF) 1 % IJ SOLN
INTRAMUSCULAR | Status: AC
  Filled 2017-09-23: qty 15

## 2017-09-23 MED ORDER — LACTATED RINGERS IV SOLN
INTRAVENOUS | Status: DC | PRN
  Administered 2017-09-23: 08:00:00 via INTRAVENOUS

## 2017-09-23 MED ORDER — CHLORHEXIDINE GLUCONATE CLOTH 2 % EX PADS: 6.0000 | MEDICATED_PAD | Freq: Once | CUTANEOUS | Status: DC

## 2017-09-23 MED ORDER — PROPOFOL 10 MG/ML IV BOLUS
INTRAVENOUS | Status: AC
  Filled 2017-09-23: qty 80

## 2017-09-23 NOTE — Addendum Note (Signed)
Addendum  created 09/23/17 1430 by Mickel Baas, CRNA   Intraprocedure Event edited

## 2017-09-23 NOTE — Anesthesia Preprocedure Evaluation (Signed)
Anesthesia Evaluation  Patient identified by MRN, date of birth, ID band Patient awake    Reviewed: Allergy & Precautions, H&P , NPO status , Patient's Chart, lab work & pertinent test results  Airway Mallampati: II  TM Distance: >3 FB Neck ROM: limited    Dental no notable dental hx.    Pulmonary neg pulmonary ROS, asthma , sleep apnea ,    Pulmonary exam normal breath sounds clear to auscultation       Cardiovascular Exercise Tolerance: Good hypertension, negative cardio ROS   Rhythm:regular Rate:Normal     Neuro/Psych  Neuromuscular disease negative neurological ROS  negative psych ROS   GI/Hepatic negative GI ROS, Neg liver ROS, hiatal hernia, GERD  ,  Endo/Other  negative endocrine ROSdiabetesHypothyroidism Morbid obesity  Renal/GU negative Renal ROS  negative genitourinary   Musculoskeletal   Abdominal   Peds  Hematology negative hematology ROS (+) anemia ,   Anesthesia Other Findings   Reproductive/Obstetrics negative OB ROS                             Anesthesia Physical Anesthesia Plan  ASA: III  Anesthesia Plan: General   Post-op Pain Management:    Induction:   PONV Risk Score and Plan: Propofol infusion  Airway Management Planned:   Additional Equipment:   Intra-op Plan:   Post-operative Plan:   Informed Consent: I have reviewed the patients History and Physical, chart, labs and discussed the procedure including the risks, benefits and alternatives for the proposed anesthesia with the patient or authorized representative who has indicated his/her understanding and acceptance.   Dental Advisory Given  Plan Discussed with: CRNA  Anesthesia Plan Comments:         Anesthesia Quick Evaluation

## 2017-09-23 NOTE — Discharge Instructions (Signed)
°Colonoscopy °Discharge Instructions ° °Read the instructions outlined below and refer to this sheet in the next few weeks. These discharge instructions provide you with general information on caring for yourself after you leave the hospital. Your doctor may also give you specific instructions. While your treatment has been planned according to the most current medical practices available, unavoidable complications occasionally occur. If you have any problems or questions after discharge, call Dr. Rourk at [phone]. °ACTIVITY °· You may resume your regular activity, but move at a slower pace for the next 24 hours.  °· Take frequent rest periods for the next 24 hours.  °· Walking will help get rid of the air and reduce the bloated feeling in your belly (abdomen).  °· No driving for 24 hours (because of the medicine (anesthesia) used during the test).   °· Do not sign any important legal documents or operate any machinery for 24 hours (because of the anesthesia used during the test).  °NUTRITION °· Drink plenty of fluids.  °· You may resume your normal diet as instructed by your doctor.  °· Begin with a light meal and progress to your normal diet. Heavy or fried foods are harder to digest and may make you feel sick to your stomach (nauseated).  °· Avoid alcoholic beverages for 24 hours or as instructed.  °MEDICATIONS °· You may resume your normal medications unless your doctor tells you otherwise.  °WHAT YOU CAN EXPECT TODAY °· Some feelings of bloating in the abdomen.  °· Passage of more gas than usual.  °· Spotting of blood in your stool or on the toilet paper.  °IF YOU HAD POLYPS REMOVED DURING THE COLONOSCOPY: °· No aspirin products for 7 days or as instructed.  °· No alcohol for 7 days or as instructed.  °· Eat a soft diet for the next 24 hours.  °FINDING OUT THE RESULTS OF YOUR TEST °Not all test results are available during your visit. If your test results are not back during the visit, make an appointment  with your caregiver to find out the results. Do not assume everything is normal if you have not heard from your caregiver or the medical facility. It is important for you to follow up on all of your test results.  °SEEK IMMEDIATE MEDICAL ATTENTION IF: °· You have more than a spotting of blood in your stool.  °· Your belly is swollen (abdominal distention).  °· You are nauseated or vomiting.  °· You have a temperature over 101.  °· You have abdominal pain or discomfort that is severe or gets worse throughout the day.  ° ° ° °Colon polyp and diverticulosis information provided ° °Further recommendations to follow pending review of pathology report ° ° °Diverticulosis °Diverticulosis is a condition that develops when small pouches (diverticula) form in the wall of the large intestine (colon). The colon is where water is absorbed and stool is formed. The pouches form when the inside layer of the colon pushes through weak spots in the outer layers of the colon. You may have a few pouches or many of them. °What are the causes? °The cause of this condition is not known. °What increases the risk? °The following factors may make you more likely to develop this condition: °· Being older than age 60. Your risk for this condition increases with age. Diverticulosis is rare among people younger than age 30. By age 80, many people have it. °· Eating a low-fiber diet. °· Having frequent constipation. °· Being   overweight. °· Not getting enough exercise. °· Smoking. °· Taking over-the-counter pain medicines, like aspirin and ibuprofen. °· Having a family history of diverticulosis. ° °What are the signs or symptoms? °In most people, there are no symptoms of this condition. If you do have symptoms, they may include: °· Bloating. °· Cramps in the abdomen. °· Constipation or diarrhea. °· Pain in the lower left side of the abdomen. ° °How is this diagnosed? °This condition is most often diagnosed during an exam for other colon problems.  Because diverticulosis usually has no symptoms, it often cannot be diagnosed independently. This condition may be diagnosed by: °· Using a flexible scope to examine the colon (colonoscopy). °· Taking an X-ray of the colon after dye has been put into the colon (barium enema). °· Doing a CT scan. ° °How is this treated? °You may not need treatment for this condition if you have never developed an infection related to diverticulosis. If you have had an infection before, treatment may include: °· Eating a high-fiber diet. This may include eating more fruits, vegetables, and grains. °· Taking a fiber supplement. °· Taking a live bacteria supplement (probiotic). °· Taking medicine to relax your colon. °· Taking antibiotic medicines. ° °Follow these instructions at home: °· Drink 6-8 glasses of water or more each day to prevent constipation. °· Try not to strain when you have a bowel movement. °· If you have had an infection before: °? Eat more fiber as directed by your health care provider or your diet and nutrition specialist (dietitian). °? Take a fiber supplement or probiotic, if your health care provider approves. °· Take over-the-counter and prescription medicines only as told by your health care provider. °· If you were prescribed an antibiotic, take it as told by your health care provider. Do not stop taking the antibiotic even if you start to feel better. °· Keep all follow-up visits as told by your health care provider. This is important. °Contact a health care provider if: °· You have pain in your abdomen. °· You have bloating. °· You have cramps. °· You have not had a bowel movement in 3 days. °Get help right away if: °· Your pain gets worse. °· Your bloating becomes very bad. °· You have a fever or chills, and your symptoms suddenly get worse. °· You vomit. °· You have bowel movements that are bloody or black. °· You have bleeding from your rectum. °Summary °· Diverticulosis is a condition that develops when  small pouches (diverticula) form in the wall of the large intestine (colon). °· You may have a few pouches or many of them. °· This condition is most often diagnosed during an exam for other colon problems. °· If you have had an infection related to diverticulosis, treatment may include increasing the fiber in your diet, taking supplements, or taking medicines. °This information is not intended to replace advice given to you by your health care provider. Make sure you discuss any questions you have with your health care provider. °Document Released: 02/16/2004 Document Revised: 04/09/2016 Document Reviewed: 04/09/2016 °Elsevier Interactive Patient Education © 2017 [employer] ° °Colon Polyps °Polyps are tissue growths inside the body. Polyps can grow in many places, including the large intestine (colon). A polyp may be a round bump or a mushroom-shaped growth. You could have one polyp or several. °Most colon polyps are noncancerous (benign). However, some colon polyps can become cancerous over time. °What are the causes? °The exact cause of colon polyps is not   known. What increases the risk? This condition is more likely to develop in people who:  Have a family history of colon cancer or colon polyps.  Are older than 52 or older than 45 if they are African American.  Have inflammatory bowel disease, such as ulcerative colitis or Crohn disease.  Are overweight.  Smoke cigarettes.  Do not get enough exercise.  Drink too much alcohol.  Eat a diet that is: ? High in fat and red meat. ? Low in fiber.  Had childhood cancer that was treated with abdominal radiation.  What are the signs or symptoms? Most polyps do not cause symptoms. If you have symptoms, they may include:  Blood coming from your rectum when having a bowel movement.  Blood in your stool.The stool may look dark red or black.  A change in bowel habits, such as constipation or diarrhea.  How is this diagnosed? This  condition is diagnosed with a colonoscopy. This is a procedure that uses a lighted, flexible scope to look at the inside of your colon. How is this treated? Treatment for this condition involves removing any polyps that are found. Those polyps will then be tested for cancer. If cancer is found, your health care provider will talk to you about options for colon cancer treatment. Follow these instructions at home: Diet  Eat plenty of fiber, such as fruits, vegetables, and whole grains.  Eat foods that are high in calcium and vitamin D, such as milk, cheese, yogurt, eggs, liver, fish, and broccoli.  Limit foods high in fat, red meats, and processed meats, such as hot dogs, sausage, bacon, and lunch meats.  Maintain a healthy weight, or lose weight if recommended by your health care provider. General instructions  Do not smoke cigarettes.  Do not drink alcohol excessively.  Keep all follow-up visits as told by your health care provider. This is important. This includes keeping regularly scheduled colonoscopies. Talk to your health care provider about when you need a colonoscopy.  Exercise every day or as told by your health care provider. Contact a health care provider if:  You have new or worsening bleeding during a bowel movement.  You have new or increased blood in your stool.  You have a change in bowel habits.  You unexpectedly lose weight. This information is not intended to replace advice given to you by your health care provider. Make sure you discuss any questions you have with your health care provider. Document Released: 02/15/2004 Document Revised: 10/27/2015 Document Reviewed: 04/11/2015 Elsevier Interactive Patient Education  2018 [employer] POST-ANESTHESIA  IMMEDIATELY FOLLOWING SURGERY:  Do not drive or operate machinery for the first twenty four hours after surgery.  Do not make any important decisions for twenty four hours after surgery  or while taking narcotic pain medications or sedatives.  If you develop intractable nausea and vomiting or a severe headache please notify your doctor immediately.  FOLLOW-UP:  Please make an appointment with your surgeon as instructed. You do not need to follow up with anesthesia unless specifically instructed to do so.  WOUND CARE INSTRUCTIONS (if applicable):  Keep a dry clean dressing on the anesthesia/puncture wound site if there is drainage.  Once the wound has quit draining you may leave it open to air.  Generally you should leave the bandage intact for twenty four hours unless there is drainage.  If the epidural site drains for more than 36-48 hours please call the anesthesia department.  QUESTIONS?:  Please feel free to call your physician or the hospital operator if you have any questions, and they will be happy to assist you.

## 2017-09-23 NOTE — Transfer of Care (Signed)
Immediate Anesthesia Transfer of Care Note  Patient: Sandy Valencia  Procedure(s) Performed: COLONOSCOPY WITH PROPOFOL (N/A ) POLYPECTOMY  Patient Location: PACU  Anesthesia Type:MAC  Level of Consciousness: awake, alert , oriented and patient cooperative  Airway & Oxygen Therapy: Patient Spontanous Breathing  Post-op Assessment: Report given to RN and Post -op Vital signs reviewed and stable  Post vital signs: Reviewed and stable  Last Vitals:  Vitals Value Taken Time  BP    Temp    Pulse    Resp    SpO2      Last Pain:  Vitals:   09/23/17 0631  TempSrc: Oral  PainSc: 0-No pain      Patients Stated Pain Goal: 6 ([id number])  Complications: No apparent anesthesia complications

## 2017-09-23 NOTE — Anesthesia Procedure Notes (Signed)
Procedure Name: MAC Date/Time: 09/23/2017 7:39 AM Performed by: Andree Elk Suzetta Timko A, CRNA Pre-anesthesia Checklist: Patient identified, Emergency Drugs available, Suction available, Patient being monitored and Timeout performed Oxygen Delivery Method: Simple face mask

## 2017-09-23 NOTE — Anesthesia Postprocedure Evaluation (Signed)
Anesthesia Post Note  Patient: Nadiyah W Alire  Procedure(s) Performed: COLONOSCOPY WITH PROPOFOL (N/A ) POLYPECTOMY  Patient location during evaluation: PACU Anesthesia Type: General Level of consciousness: awake and alert, oriented and patient cooperative Pain management: pain level controlled Vital Signs Assessment: post-procedure vital signs reviewed and stable Respiratory status: spontaneous breathing and respiratory function stable Cardiovascular status: stable Postop Assessment: no apparent nausea or vomiting Anesthetic complications: no     Last Vitals:  Vitals:   09/23/17 0635 09/23/17 0640  BP:  (!) 150/74  Pulse:    Resp: 20 (!) 22  Temp:    SpO2: 96% 98%    Last Pain:  Vitals:   09/23/17 0631  TempSrc: Oral  PainSc: 0-No pain                 ADAMS, AMY A

## 2017-09-23 NOTE — Interval H&P Note (Signed)
History and Physical Interval Note:  09/23/2017 7:30 AM  Sandy Valencia  has presented today for surgery, with the diagnosis of history polyps  The various methods of treatment have been discussed with the patient and family. After consideration of risks, benefits and other options for treatment, the patient has consented to  Procedure(s) with comments: COLONOSCOPY WITH PROPOFOL (N/A) - 7:30am as a surgical intervention .  The patient's history has been reviewed, patient examined, no change in status, stable for surgery.  I have reviewed the patient's chart and labs.  Questions were answered to the patient's satisfaction.     Sandy Valencia    No change; TCS per plan.  The risks, benefits, limitations, alternatives and imponderables have been reviewed with the patient. Questions have been answered. All parties are agreeable.

## 2017-09-23 NOTE — Op Note (Signed)
[facility] Patient Name: Sandy Valencia Procedure Date: 09/23/2017 7:14 AM MRN: [id number] Date of Birth: 1961-01-20 Attending MD: Norvel Richards , MD CSN: [id number] Age: 57 Admit Type: Outpatient Procedure:                Colonoscopy Indications:              High risk colon cancer surveillance: Personal                            history of colonic polyps Providers:                Norvel Richards, MD, Janeece Riggers, RN, Nelma Rothman, Technician Referring MD:              Medicines:                Propofol per Anesthesia Complications:            No immediate complications. Estimated Blood Loss:     Estimated blood loss was minimal. Procedure:                After obtaining informed consent, the colonoscope                            was passed under direct vision. Throughout the                            procedure, the patient's blood pressure, pulse, and                            oxygen saturations were monitored continuously. The                            EC-[id number] ([id number]) scope was introduced through                            the and advanced to the the cecum, identified by                            appendiceal orifice and ileocecal valve. Scope In: 8:25:05 AM Scope Out: 8:04:07 AM Scope Withdrawal Time: 0 hours 8 minutes 47 seconds  Total Procedure Duration: 0 hours 17 minutes 11 seconds  Findings:      The perianal and digital rectal examinations were normal.      Internal hemorrhoids were found during retroflexion. The hemorrhoids       were moderate, medium-sized and Grade I (internal hemorrhoids that do       not prolapse).      Three semi-pedunculated polyps were found in the rectum, splenic flexure       and hepatic flexure. The polyps were 5 to 6 mm in size. These polyps       were removed with a cold snare. Resection and retrieval were complete.       Estimated blood loss was minimal.      Scattered small-mouthed  diverticula were found in the sigmoid colon and  descending colon.      The exam was otherwise without abnormality on direct and retroflexion       views. Impression:               - Internal hemorrhoids.                           - Three 5 to 6 mm polyps in the rectum, at the                            splenic flexure and at the hepatic flexure, removed                            with a cold snare. Resected and retrieved.                           - Diverticulosis in the sigmoid colon and in the                            descending colon.                           - The examination was otherwise normal on direct                            and retroflexion views. Moderate Sedation:      Moderate (conscious) sedation was personally administered by an       anesthesia professional. The following parameters were monitored: oxygen       saturation, heart rate, blood pressure, respiratory rate, EKG, adequacy       of pulmonary ventilation, and response to care. Total physician       intraservice time was 22 minutes. Recommendation:           - Patient has a contact number available for                            emergencies. The signs and symptoms of potential                            delayed complications were discussed with the                            patient. Return to normal activities tomorrow.                            Written discharge instructions were provided to the                            patient.                           - Resume previous diet.                           - Continue present medications.                           -  Await pathology results.                           - Repeat colonoscopy date to be determined after                            pending pathology results are reviewed for                            surveillance.                           - Return to GI office (date not yet determined). Procedure Code(s):        --- Professional ---                            [phone], Colonoscopy, flexible; with removal of                            tumor(s), polyp(s), or other lesion(s) by snare                            technique Diagnosis Code(s):        --- Professional ---                           Z86.010, Personal history of colonic polyps                           K62.1, Rectal polyp                           D12.3, Benign neoplasm of transverse colon (hepatic                            flexure or splenic flexure)                           K64.0, First degree hemorrhoids                           K57.30, Diverticulosis of large intestine without                            perforation or abscess without bleeding CPT copyright 2017 [employer]. All rights reserved. The codes documented in this report are preliminary and upon coder review may  be revised to meet current compliance requirements. Cristopher Estimable. Tosha Belgarde, MD Norvel Richards, MD 09/23/2017 8:28:22 AM This report has been signed electronically. Number of Addenda: 0

## 2017-09-23 NOTE — Progress Notes (Signed)
Patient and daughter waiting to speak to Dr. Gala Romney postoperatively.

## 2017-09-23 NOTE — OR Nursing (Signed)
Report given to Abbie Sons RN who relieved me of duties.

## 2017-09-25 ENCOUNTER — Encounter: Payer: Self-pay | Admitting: Internal Medicine

## 2017-09-27 ENCOUNTER — Encounter (HOSPITAL_COMMUNITY): Payer: Self-pay | Admitting: Internal Medicine

## 2017-10-10 ENCOUNTER — Other Ambulatory Visit: Payer: Self-pay | Admitting: "Endocrinology

## 2017-11-06 ENCOUNTER — Other Ambulatory Visit (HOSPITAL_COMMUNITY): Payer: Self-pay | Admitting: Pulmonary Disease

## 2017-11-06 DIAGNOSIS — Z1231 Encounter for screening mammogram for malignant neoplasm of breast: Secondary | ICD-10-CM

## 2017-11-07 LAB — HEMOGLOBIN A1C
EAG (MMOL/L): 11.9 (calc)
Hgb A1c MFr Bld: 9.1 % of total Hgb — ABNORMAL HIGH (ref <5.7)
Mean Plasma Glucose: 214 (calc)

## 2017-11-07 LAB — COMPLETE METABOLIC PANEL WITH GFR
AG Ratio: 1.6 (calc) (ref 1.0–2.5)
ALT: 50 U/L — AB (ref 6–29)
AST: 34 U/L (ref 10–35)
Albumin: 4.2 g/dL (ref 3.6–5.1)
Alkaline phosphatase (APISO): 56 U/L (ref 33–130)
BUN: 18 mg/dL (ref 7–25)
CALCIUM: 8.9 mg/dL (ref 8.6–10.4)
CO2: 26 mmol/L (ref 20–32)
CREATININE: 0.78 mg/dL (ref 0.50–1.05)
Chloride: 102 mmol/L (ref 98–110)
GFR, EST AFRICAN AMERICAN: 98 mL/min/{1.73_m2} (ref >=60)
GFR, Est Non African American: 85 mL/min/{1.73_m2} (ref >=60)
GLOBULIN: 2.6 g/dL (ref 1.9–3.7)
Glucose, Bld: 152 mg/dL — ABNORMAL HIGH (ref 65–99)
Potassium: 3.6 mmol/L (ref 3.5–5.3)
Sodium: 138 mmol/L (ref 135–146)
TOTAL PROTEIN: 6.8 g/dL (ref 6.1–8.1)
Total Bilirubin: 0.5 mg/dL (ref 0.2–1.2)

## 2017-11-07 LAB — TSH: TSH: 3.53 m[IU]/L (ref 0.40–4.50)

## 2017-11-07 LAB — T4, FREE: Free T4: 1.1 ng/dL (ref 0.8–1.8)

## 2017-11-11 ENCOUNTER — Encounter: Payer: Self-pay | Admitting: "Endocrinology

## 2017-11-11 ENCOUNTER — Ambulatory Visit: Payer: Medicaid Other | Admitting: "Endocrinology

## 2017-11-11 VITALS — BP 152/77 | HR 80 | Ht 65.0 in | Wt 248.0 lb

## 2017-11-11 DIAGNOSIS — E1165 Type 2 diabetes mellitus with hyperglycemia: Secondary | ICD-10-CM | POA: Diagnosis not present

## 2017-11-11 DIAGNOSIS — Z794 Long term (current) use of insulin: Secondary | ICD-10-CM

## 2017-11-11 DIAGNOSIS — Z6838 Body mass index (BMI) 38.0-38.9, adult: Secondary | ICD-10-CM

## 2017-11-11 DIAGNOSIS — E782 Mixed hyperlipidemia: Secondary | ICD-10-CM

## 2017-11-11 DIAGNOSIS — IMO0002 Reserved for concepts with insufficient information to code with codable children: Secondary | ICD-10-CM

## 2017-11-11 DIAGNOSIS — I10 Essential (primary) hypertension: Secondary | ICD-10-CM

## 2017-11-11 DIAGNOSIS — E118 Type 2 diabetes mellitus with unspecified complications: Secondary | ICD-10-CM | POA: Diagnosis not present

## 2017-11-11 DIAGNOSIS — E039 Hypothyroidism, unspecified: Secondary | ICD-10-CM

## 2017-11-11 MED ORDER — INSULIN ASPART 100 UNIT/ML FLEXPEN: 25.0000 [IU] | PEN_INJECTOR | Freq: Three times a day (TID) | SUBCUTANEOUS | 2 refills | Status: DC

## 2017-11-11 MED ORDER — INSULIN GLARGINE 100 UNIT/ML SOLOSTAR PEN: 80.0000 [IU] | PEN_INJECTOR | Freq: Every day | SUBCUTANEOUS | 2 refills | Status: DC

## 2017-11-11 MED ORDER — LIRAGLUTIDE 18 MG/3ML ~~LOC~~ SOPN: 1.2000 mg | PEN_INJECTOR | Freq: Every day | SUBCUTANEOUS | 2 refills | Status: DC

## 2017-11-11 NOTE — Progress Notes (Signed)
Subjective:    Patient ID: Sandy Valencia, female    DOB: Oct 10, 1960, PCP Sinda Du, MD   Past Medical History:  Diagnosis Date  . Arthritis   . Asthma   . Diabetes mellitus   . Fatty liver   . GERD (gastroesophageal reflux disease)   . Hypercholesteremia   . Hypertension   . Hypothyroidism   . Sleep apnea    pt siad, "i had small amount but could not tolerate CPAP and they said it was ok since it was mild.   Past Surgical History:  Procedure Laterality Date  . ABDOMINAL HYSTERECTOMY    . CHOLECYSTECTOMY    . COLONOSCOPY N/A 09/22/2012   RMR: colonic polyps -removed as described above. tubular adenoma, next TCS 09/2017  . COLONOSCOPY WITH PROPOFOL N/A 09/23/2017   Procedure: COLONOSCOPY WITH PROPOFOL;  Surgeon: Daneil Dolin, MD;  Location: [facility];  Service: Endoscopy;  Laterality: N/A;  7:30am  . CYST EXCISION Left 07/19/2016   Procedure: CYST REMOVAL LEFT RING FINGER;  Surgeon: Carole Civil, MD;  Location: [facility];  Service: Orthopedics;  Laterality: Left;  . CYST REMOVAL LEG Right    foot  . ESOPHAGOGASTRODUODENOSCOPY N/A 10/21/2014   RMR: Small hiatal hernia; otherwise normal EGD status post passage of a Maloney dilator.   Marland Kitchen MALONEY DILATION N/A 10/21/2014   Procedure: [name] DILATION;  Surgeon: Daneil Dolin, MD;  Location: [facility];  Service: Endoscopy;  Laterality: N/A;  . MIDDLE EAR SURGERY Right    patched hole in ear drum  . POLYPECTOMY  09/23/2017   Procedure: POLYPECTOMY;  Surgeon: Daneil Dolin, MD;  Location: [facility];  Service: Endoscopy;;  polyp hepatic flexure polyp cs, splenic flexure polyp cs, rectal polyp cs   Social History   Socioeconomic History  . Marital status: Divorced    Spouse name: Not on file  . Number of children: Not on file  . Years of education: Not on file  . Highest education level: Not on file  Occupational History  . Not on file  Social Needs  . Financial resource strain: Not on file  . Food  insecurity:    Worry: Not on file    Inability: Not on file  . Transportation needs:    Medical: Not on file    Non-medical: Not on file  Tobacco Use  . Smoking status: Never Smoker  . Smokeless tobacco: Never Used  Substance and Sexual Activity  . Alcohol use: No    Alcohol/week: 0.0 oz  . Drug use: No  . Sexual activity: Yes    Birth control/protection: Surgical  Lifestyle  . Physical activity:    Days per week: Not on file    Minutes per session: Not on file  . Stress: Not on file  Relationships  . Social connections:    Talks on phone: Not on file    Gets together: Not on file    Attends religious service: Not on file    Active member of club or organization: Not on file    Attends meetings of clubs or organizations: Not on file    Relationship status: Not on file  Other Topics Concern  . Not on file  Social History Narrative  . Not on file   Outpatient Encounter Medications as of 11/11/2017  Medication Sig  . aspirin EC 81 MG tablet Take 81 mg by mouth daily.  . Cholecalciferol (VITAMIN D-3) 1000 UNITS CAPS Take 1,000 Units  by mouth daily.   . fluticasone (FLOVENT HFA) 220 MCG/ACT inhaler Inhale 2 puffs into the lungs 2 (two) times daily.   Marland Kitchen glucose blood (ACCU-CHEK COMPACT PLUS) test strip Use as instructed  . HYDROcodone-acetaminophen (NORCO) 7.5-325 MG tablet Take 1 tablet by mouth every 6 (six) hours as needed for moderate pain.  Marland Kitchen insulin aspart (NOVOLOG FLEXPEN) 100 UNIT/ML FlexPen Inject 25-31 Units into the skin 3 (three) times daily with meals.  . Insulin Glargine (LANTUS SOLOSTAR) 100 UNIT/ML Solostar Pen Inject 80 Units into the skin daily at 10 pm.  . levothyroxine (SYNTHROID, LEVOTHROID) 75 MCG tablet Take 1 tablet (75 mcg total) by mouth daily before breakfast.  . liraglutide (VICTOZA) 18 MG/3ML SOPN Inject 0.2 mLs (1.2 mg total) into the skin daily.  Marland Kitchen lisinopril-hydrochlorothiazide (PRINZIDE,ZESTORETIC) 20-25 MG tablet TAKE ONE TABLET BY MOUTH EVERY  DAY.  . Melatonin 5 MG TABS Take 5 mg by mouth at bedtime.  . metFORMIN (GLUCOPHAGE XR) 500 MG 24 hr tablet Take 1 tablet (500 mg total) by mouth 2 (two) times daily after a meal.  . pantoprazole (PROTONIX) 40 MG tablet TAKE ONE (1) TABLET EACH DAY  . simvastatin (ZOCOR) 10 MG tablet TAKE ONE TABLET BY MOUTH EVERY NIGHT AT BEDTIME  . vitamin B-12 (CYANOCOBALAMIN) 500 MCG tablet Take 500 mcg by mouth daily.  . [DISCONTINUED] insulin aspart (NOVOLOG FLEXPEN) 100 UNIT/ML FlexPen Inject 22-28 Units into the skin 3 (three) times daily with meals.  . [DISCONTINUED] Insulin Glargine (LANTUS SOLOSTAR) 100 UNIT/ML Solostar Pen Inject 70 Units into the skin daily at 10 pm.  . [DISCONTINUED] Insulin Glargine (LANTUS SOLOSTAR) 100 UNIT/ML Solostar Pen Inject 70 Units into the skin daily at 10 pm.  . [DISCONTINUED] Insulin Pen Needle (B-D ULTRAFINE III SHORT PEN) 31G X 8 MM MISC 1 each by Does not apply route as directed.  . [DISCONTINUED] Lancets MISC 1 each by Does not apply route as directed.   No facility-administered encounter medications on file as of 11/11/2017.    ALLERGIES: Allergies  Allergen Reactions  . Peanut-Containing Drug Products Anaphylaxis and Swelling   VACCINATION STATUS: There is no immunization history for the selected administration types on file for this patient.  Diabetes  She presents for her follow-up diabetic visit. She has type 2 diabetes mellitus. Her disease course has been stable (She was diagnosed at approximate age of 37 years.). There are no hypoglycemic associated symptoms. Pertinent negatives for hypoglycemia include no confusion, headaches, pallor or seizures. Associated symptoms include polydipsia and polyuria. Pertinent negatives for diabetes include no chest pain and no polyphagia. There are no hypoglycemic complications. Symptoms are stable. There are no diabetic complications. Risk factors for coronary artery disease include dyslipidemia, diabetes mellitus,  hypertension, sedentary lifestyle and tobacco exposure. Current diabetic treatment includes insulin injections. She is compliant with treatment most of the time. Her weight is increasing steadily. She is following a generally unhealthy diet. She never participates in exercise. Her home blood glucose trend is decreasing rapidly. Her breakfast blood glucose range is generally >200 mg/dl. Her lunch blood glucose range is generally >200 mg/dl. Her dinner blood glucose range is generally >200 mg/dl. Her bedtime blood glucose range is generally >200 mg/dl. Her overall blood glucose range is >200 mg/dl. An ACE inhibitor/angiotensin II receptor blocker is being taken.  Hyperlipidemia  This is a chronic problem. The current episode started more than 1 year ago. The problem is uncontrolled. Exacerbating diseases include diabetes, hypothyroidism and obesity. Pertinent negatives include no chest pain,  myalgias or shortness of breath. Current antihyperlipidemic treatment includes statins. Risk factors for coronary artery disease include diabetes mellitus, dyslipidemia, hypertension, obesity, a sedentary lifestyle and post-menopausal.  Hypertension  This is a chronic problem. The current episode started more than 1 year ago. The problem is uncontrolled. Pertinent negatives include no chest pain, headaches, palpitations or shortness of breath. Risk factors for coronary artery disease include dyslipidemia, diabetes mellitus, obesity, sedentary lifestyle and smoking/tobacco exposure. Past treatments include ACE inhibitors and diuretics.     Review of Systems  Constitutional: Negative for unexpected weight change.  HENT: Negative for trouble swallowing and voice change.   Eyes: Negative for visual disturbance.  Respiratory: Negative for cough, shortness of breath and wheezing.   Cardiovascular: Negative for chest pain, palpitations and leg swelling.  Gastrointestinal: Negative for diarrhea, nausea and vomiting.   Endocrine: Positive for polydipsia and polyuria. Negative for cold intolerance, heat intolerance and polyphagia.  Musculoskeletal: Negative for arthralgias and myalgias.  Skin: Negative for color change, pallor, rash and wound.  Neurological: Negative for seizures and headaches.  Psychiatric/Behavioral: Negative for confusion and suicidal ideas.    Objective:    BP (!) 152/77   Pulse 80   Ht _0  (1.651 m)   Wt 248 lb (112.5 kg)   BMI 41.27 kg/m   Wt Readings from Last 3 Encounters:  11/11/17 248 lb (112.5 kg)  09/17/17 241 lb (109.3 kg)  08/26/17 242 lb 12.8 oz (110.1 kg)    Physical Exam  Constitutional: She is oriented to person, place, and time. She appears well-developed.  HENT:  Head: Normocephalic and atraumatic.  Eyes: EOM are normal.  Neck: Normal range of motion. Neck supple. No tracheal deviation present. No thyromegaly present.  Cardiovascular: Normal rate.  Pulmonary/Chest: Effort normal.  Abdominal: There is no tenderness. There is no guarding.  Musculoskeletal: Normal range of motion. She exhibits no edema.  Neurological: She is alert and oriented to person, place, and time. No cranial nerve deficit. Coordination normal.  Skin: Skin is warm and dry. No rash noted. No erythema. No pallor.  Psychiatric: She has a normal mood and affect. Judgment normal.   Recent Results (from the past 2160 hour(s))  CBC with Differential/Platelet     Status: None   Collection Time: 09/17/17 10:20 AM  Result Value Ref Range   WBC 5.3 4.0 - 10.5 K/uL   RBC 4.66 3.87 - 5.11 MIL/uL   Hemoglobin 13.2 12.0 - 15.0 g/dL   HCT 42.7 36.0 - 46.0 %   MCV 91.6 78.0 - 100.0 fL   MCH 28.3 26.0 - 34.0 pg   MCHC 30.9 30.0 - 36.0 g/dL   RDW 13.9 11.5 - 15.5 %   Platelets 246 150 - 400 K/uL   Neutrophils Relative % 59 %   Neutro Abs 3.2 1.7 - 7.7 K/uL   Lymphocytes Relative 30 %   Lymphs Abs 1.6 0.7 - 4.0 K/uL   Monocytes Relative 8 %   Monocytes Absolute 0.4 0.1 - 1.0 K/uL    Eosinophils Relative 2 %   Eosinophils Absolute 0.1 0.0 - 0.7 K/uL   Basophils Relative 1 %   Basophils Absolute 0.0 0.0 - 0.1 K/uL    Comment: Performed at [facility], [address], [place], [place] [address]  Basic metabolic panel     Status: Abnormal   Collection Time: 09/17/17 10:20 AM  Result Value Ref Range   Sodium 140 135 - 145 mmol/L   Potassium 4.1 3.5 - 5.1 mmol/L  Chloride 102 101 - 111 mmol/L   CO2 25 22 - 32 mmol/L   Glucose, Bld 131 (H) 65 - 99 mg/dL   BUN 15 6 - 20 mg/dL   Creatinine, Ser 0.75 0.44 - 1.00 mg/dL   Calcium 9.3 8.9 - 10.3 mg/dL   GFR calc non Af Amer >60 >60 mL/min   GFR calc Af Amer >60 >60 mL/min    Comment: (NOTE) The eGFR has been calculated using the CKD EPI equation. This calculation has not been validated in all clinical situations. eGFR's persistently <60 mL/min signify possible Chronic Kidney Disease.    Anion gap 13 5 - 15    Comment: Performed at [facility], [address], [place], [place] [address]  Glucose, capillary     Status: Abnormal   Collection Time: 09/23/17  6:38 AM  Result Value Ref Range   Glucose-Capillary 159 (H) 65 - 99 mg/dL  Glucose, capillary     Status: Abnormal   Collection Time: 09/23/17  8:14 AM  Result Value Ref Range   Glucose-Capillary 130 (H) 65 - 99 mg/dL  COMPLETE METABOLIC PANEL WITH GFR     Status: Abnormal   Collection Time: 11/06/17  7:41 AM  Result Value Ref Range   Glucose, Bld 152 (H) 65 - 99 mg/dL    Comment: .            Fasting reference interval . For someone without known diabetes, a glucose value >125 mg/dL indicates that they may have diabetes and this should be confirmed with a follow-up test. .    BUN 18 7 - 25 mg/dL   Creat 0.78 0.50 - 1.05 mg/dL    Comment: For patients >60 years of age, the reference limit for Creatinine is approximately 13% higher for people identified as African-American. .    GFR, Est Non African American 85 > OR = 60 mL/min/1.80m   GFR, Est African  American 98 > OR = 60 mL/min/1.732m  BUN/Creatinine Ratio NOT APPLICABLE 6 - 22 (calc)   Sodium 138 135 - 146 mmol/L   Potassium 3.6 3.5 - 5.3 mmol/L   Chloride 102 98 - 110 mmol/L   CO2 26 20 - 32 mmol/L   Calcium 8.9 8.6 - 10.4 mg/dL   Total Protein 6.8 6.1 - 8.1 g/dL   Albumin 4.2 3.6 - 5.1 g/dL   Globulin 2.6 1.9 - 3.7 g/dL (calc)   AG Ratio 1.6 1.0 - 2.5 (calc)   Total Bilirubin 0.5 0.2 - 1.2 mg/dL   Alkaline phosphatase (APISO) 56 33 - 130 U/L   AST 34 10 - 35 U/L   ALT 50 (H) 6 - 29 U/L  Hemoglobin A1c     Status: Abnormal   Collection Time: 11/06/17  7:41 AM  Result Value Ref Range   Hgb A1c MFr Bld 9.1 (H) <5.7 % of total Hgb    Comment: For someone without known diabetes, a hemoglobin A1c value of 6.5% or greater indicates that they may have  diabetes and this should be confirmed with a follow-up  test. . For someone with known diabetes, a value <7% indicates  that their diabetes is well controlled and a value  greater than or equal to 7% indicates suboptimal  control. A1c targets should be individualized based on  duration of diabetes, age, comorbid conditions, and  other considerations. . Currently, no consensus exists regarding use of hemoglobin A1c for diagnosis of diabetes for children. .    Mean Plasma Glucose  214 (calc)   eAG (mmol/L) 11.9 (calc)  T4, free     Status: None   Collection Time: 11/06/17  7:41 AM  Result Value Ref Range   Free T4 1.1 0.8 - 1.8 ng/dL  TSH     Status: None   Collection Time: 11/06/17  7:41 AM  Result Value Ref Range   TSH 3.53 0.40 - 4.50 mIU/L   Lipid Panel     Component Value Date/Time   CHOL 133 04/19/2017 0759   TRIG 62 04/19/2017 0759   HDL 69 04/19/2017 0759   CHOLHDL 1.9 04/19/2017 0759   VLDL 10 03/07/2016 0705   LDLCALC 50 04/19/2017 0759      Assessment & Plan:   1. Uncontrolled type 2 diabetes mellitus with complication, with long-term current use of insulin ([place])  -Her  diabetes is  complicated by  history of noncompliance (she recently showed better engagement) and patient remains at a high risk for more acute and chronic complications of diabetes which include CAD, CVA, CKD, retinopathy, and neuropathy. These are all discussed in detail with the patient.  -She returns with persistent and significantly above target blood glucose readings averaging greater than 200 mg/dL.    -Her recent labs show A1c of 9.1%, unchanged from last visit. - I have re-counseled the patient on diet management and weight loss  by adopting a carbohydrate restricted / protein rich  Diet.  -  Suggestion is made for her to avoid simple carbohydrates  from her diet including Cakes, Sweet Desserts / Pastries, Ice Cream, Soda (diet and regular), Sweet Tea, Candies, Chips, Cookies, Store Bought Juices, Alcohol in Excess of  1-2 drinks a day, Artificial Sweeteners, and "Sugar-free" Products. This will help patient to have stable blood glucose profile and potentially avoid unintended weight gain.   - Patient is advised to stick to a routine mealtimes to eat 3 meals  a day and avoid unnecessary snacks ( to snack only to correct hypoglycemia).  - I have approached patient with the following individualized plan to manage diabetes and patient agrees.  - Based on her presentation with her higher A1c of 9.1% consistent with average blood glucose of 215 mg/dL, she is approached for higher dose of insulin.   -I discussed and increase her Lantus to 80 units nightly, increase NovoLog to 25   units 3 times a day before meals plus correction.  She is advised to continue to use her continuous glucose monitoring device.   - She has tolerated metformin.  I advised her to continue  metformin 500 mg ER to twice a day after breakfast and supper.  -I discussed and added Victoza 1.2 mg subcutaneously daily. Precautions discussed with her.  - Patient specific target  for A1c; LDL, HDL, Triglycerides, and  Waist Circumference were discussed in  detail.  2) BP/HTN: Her blood pressure is not controlled to target.   I advised her to continue hydrochlorothiazide/lisinopril 25/20 mg by mouth daily. I counseled her on salt restriction.   3) Lipids/HPL: Controlled with LDL of 50 from November 2018.  She is advised to continue simvastatin 10 mg p.o. Nightly.  4)  Weight/Diet: She has had no success in weight control.  CDE consult in progress, exercise, and carbohydrates information provided. -She decided against bariatric surgery.    5. Personal history of noncompliance with medical treatment, presenting hazards to health -I counseled her on the need to stay focused and treat diabetes intensively to  avoid competitions.  5) Hypothyroidism:  -  Her recent labs are consistent with appropriate thyroid hormone replacement. -I advised her to continue levothyroxine 75 mcg p.o. nightly.  - We discussed about correct intake of levothyroxine, at fasting, with water, separated by at least 30 minutes from breakfast, and separated by more than 4 hours from calcium, iron, multivitamins, acid reflux medications (PPIs). -Patient is made aware of the fact that thyroid hormone replacement is needed for life, dose to be adjusted by periodic monitoring of thyroid function tests.  6) Chronic Care/Health Maintenance:  -Patient is on ACEI/ARB and Statin medications and encouraged to continue to follow up with Ophthalmology, Podiatrist at least yearly or according to recommendations, and advised to  stay away from smoking. I have recommended yearly flu vaccine and pneumonia vaccination at least every 5 years; moderate intensity exercise for up to 150 minutes weekly; and  sleep for at least 7 hours a day.  - I advised patient to maintain close follow up with Sinda Du, MD for primary care needs.  - Time spent with the patient: 25 min, of which >50% was spent in reviewing her blood glucose logs , discussing her hypo- and hyper-glycemic episodes, reviewing her  current and  previous labs and insulin doses and developing a plan to avoid hypo- and hyper-glycemia. Please refer to Patient Instructions for Blood Glucose Monitoring and Insulin/Medications Dosing Guide"  in media tab for additional information. Sandy Valencia participated in the discussions, expressed understanding, and voiced agreement with the above plans.  All questions were answered to her satisfaction. she is encouraged to contact clinic should she have any questions or concerns prior to her return visit.   Follow up plan: -Return in about 3 months (around 02/11/2018) for follow up with pre-visit labs, meter, and logs.  Glade Lloyd, MD Phone: [phone]  Fax: [phone]   -  This note was partially dictated with voice recognition software. Similar sounding words can be transcribed inadequately or may not  be corrected upon review.  11/11/2017, 1:36 PM

## 2017-11-11 NOTE — Patient Instructions (Signed)

## 2017-11-13 ENCOUNTER — Other Ambulatory Visit: Payer: Self-pay

## 2017-11-13 MED ORDER — INSULIN GLARGINE 100 UNIT/ML SOLOSTAR PEN: 80.0000 [IU] | PEN_INJECTOR | Freq: Every day | SUBCUTANEOUS | 2 refills | Status: DC

## 2017-11-13 MED ORDER — INSULIN ASPART 100 UNIT/ML FLEXPEN: 25.0000 [IU] | PEN_INJECTOR | Freq: Three times a day (TID) | SUBCUTANEOUS | 2 refills | Status: DC

## 2017-11-18 ENCOUNTER — Ambulatory Visit (INDEPENDENT_AMBULATORY_CARE_PROVIDER_SITE_OTHER): Payer: Medicaid Other | Admitting: Otolaryngology

## 2017-12-11 ENCOUNTER — Ambulatory Visit (HOSPITAL_COMMUNITY)
Admission: RE | Admit: 2017-12-11 | Discharge: 2017-12-11 | Disposition: A | Discharge status: Home / Self Care | Payer: Medicaid Other | Source: Ambulatory Visit | Attending: Pulmonary Disease | Admitting: Pulmonary Disease

## 2017-12-11 DIAGNOSIS — Z1231 Encounter for screening mammogram for malignant neoplasm of breast: Secondary | ICD-10-CM | POA: Diagnosis not present

## 2017-12-12 ENCOUNTER — Other Ambulatory Visit: Payer: Self-pay | Admitting: "Endocrinology

## 2018-01-02 ENCOUNTER — Ambulatory Visit (INDEPENDENT_AMBULATORY_CARE_PROVIDER_SITE_OTHER): Payer: Medicaid Other | Admitting: Otolaryngology

## 2018-01-02 DIAGNOSIS — H95121 Granulation of postmastoidectomy cavity, right ear: Secondary | ICD-10-CM

## 2018-01-03 ENCOUNTER — Encounter (HOSPITAL_COMMUNITY): Payer: Self-pay

## 2018-01-03 ENCOUNTER — Emergency Department (HOSPITAL_COMMUNITY)
Admission: EM | Admit: 2018-01-03 | Discharge: 2018-01-03 | Disposition: A | Discharge status: Home / Self Care | Payer: Medicaid Other | Attending: Emergency Medicine | Admitting: Emergency Medicine

## 2018-01-03 DIAGNOSIS — R600 Localized edema: Secondary | ICD-10-CM | POA: Diagnosis present

## 2018-01-03 DIAGNOSIS — H11421 Conjunctival edema, right eye: Secondary | ICD-10-CM | POA: Diagnosis not present

## 2018-01-03 DIAGNOSIS — E039 Hypothyroidism, unspecified: Secondary | ICD-10-CM | POA: Insufficient documentation

## 2018-01-03 DIAGNOSIS — J45909 Unspecified asthma, uncomplicated: Secondary | ICD-10-CM | POA: Diagnosis not present

## 2018-01-03 DIAGNOSIS — E119 Type 2 diabetes mellitus without complications: Secondary | ICD-10-CM | POA: Insufficient documentation

## 2018-01-03 DIAGNOSIS — Z7982 Long term (current) use of aspirin: Secondary | ICD-10-CM | POA: Insufficient documentation

## 2018-01-03 DIAGNOSIS — Z794 Long term (current) use of insulin: Secondary | ICD-10-CM | POA: Insufficient documentation

## 2018-01-03 DIAGNOSIS — Z91010 Allergy to peanuts: Secondary | ICD-10-CM | POA: Diagnosis not present

## 2018-01-03 DIAGNOSIS — Z79899 Other long term (current) drug therapy: Secondary | ICD-10-CM | POA: Diagnosis not present

## 2018-01-03 LAB — URINALYSIS, ROUTINE W REFLEX MICROSCOPIC
Bacteria, UA: NONE SEEN
Bilirubin Urine: NEGATIVE
Hgb urine dipstick: NEGATIVE
KETONES UR: NEGATIVE mg/dL
NITRITE: NEGATIVE
PH: 5 (ref 5.0–8.0)
Protein, ur: NEGATIVE mg/dL
Specific Gravity, Urine: 1.025 (ref 1.005–1.030)

## 2018-01-03 MED ORDER — LORATADINE 10 MG PO TABS
10.0000 mg | ORAL_TABLET | Freq: Once | ORAL | Status: AC
  Administered 2018-01-03: 10 mg via ORAL
  Filled 2018-01-03: qty 1

## 2018-01-03 MED ORDER — FEXOFENADINE HCL 60 MG PO TABS: 60.0000 mg | ORAL_TABLET | Freq: Two times a day (BID) | ORAL | 1 refills | Status: DC

## 2018-01-03 MED ORDER — NEOMYCIN-POLYMYXIN-DEXAMETH 3.5-10000-0.1 OP SUSP
2.0000 [drp] | Freq: Once | OPHTHALMIC | Status: AC
  Administered 2018-01-03: 2 [drp] via OPHTHALMIC
  Filled 2018-01-03: qty 5

## 2018-01-03 NOTE — Discharge Instructions (Signed)
You have swelling of your lower lid of the right eye, as well as swelling of the white portion of your eye (conjunctiva).  This is called chemosis.  Please use a cool compress to your eye.  Please use the Maxitrol eyedrops.  Please use the Allegra daily.  Please wash your hands frequently and keep your distance from others, as this condition can sometimes be the preliminary symptoms before pinkeye.  Please see Dr. Luan Pulling for additional follow-up and evaluation if not improving.  Please monitor your glucose carefully.  Your glucose is elevated in the urine specimen.  Your specific gravity was in the upper limits of normal.  You have not reached a dehydration state, but should increase your water as well as a low sugar electrolyte drink.

## 2018-01-03 NOTE — ED Provider Notes (Signed)
[facility] Provider Note   CSN: [id number] Arrival date & time: 01/03/18  1021     History   Chief Complaint Chief Complaint  Patient presents with  . Facial Swelling    HPI Sandy Valencia is a 57 y.o. female.  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  She thinks that she may have gotten dehydrated because she is gotten very hot and also started to do some vomiting.  The patient states that today she was cleaning up in the kitchen and was cooking breakfast.  Her right eye itched and she scratched it slightly and then she noted significant swelling of the lower lid and then she not started noticing swelling on the white portion of her eye.  This frightened her and she came to the emergency department for additional evaluation.  She has mild blurring of her vision.  She has been doing more tearing in her eye than usual no significant tearing of the left eye.  No swelling of the left eye.  The history is provided by the patient.    Past Medical History:  Diagnosis Date  . Arthritis   . Asthma   . Diabetes mellitus   . Fatty liver   . GERD (gastroesophageal reflux disease)   . Hypercholesteremia   . Hypertension   . Hypothyroidism   . Sleep apnea    pt siad, "i had small amount but could not tolerate CPAP and they said it was ok since it was mild.    Patient Active Problem List   Diagnosis Date Noted  . History of colonic polyps 08/26/2017  . Hypothyroidism 04/22/2017  . Mixed hyperlipidemia 12/31/2016  . Digital mucous cyst of finger of left hand   . Uncontrolled type 2 diabetes mellitus with complication, with long-term current use of insulin ([place]) 05/18/2015  . Essential hypertension, benign 05/18/2015  . Class 2 severe obesity due to excess calories with serious comorbidity and body mass index (BMI) of 38.0 to 38.9 in adult ([place]) 05/18/2015  . Personal history of noncompliance with  medical treatment, presenting hazards to health 05/18/2015  . GERD (gastroesophageal reflux disease) 02/14/2015  . Anemia, iron deficiency 02/14/2015  . Fatty liver 02/14/2015  . Hiatal hernia   . Dysphagia, pharyngoesophageal phase   . Dysphagia 10/11/2014  . Dyspepsia 10/11/2014  . Family history of colon cancer 09/03/2012    Past Surgical History:  Procedure Laterality Date  . ABDOMINAL HYSTERECTOMY    . CHOLECYSTECTOMY    . COLONOSCOPY N/A 09/22/2012   RMR: colonic polyps -removed as described above. tubular adenoma, next TCS 09/2017  . COLONOSCOPY WITH PROPOFOL N/A 09/23/2017   Procedure: COLONOSCOPY WITH PROPOFOL;  Surgeon: Daneil Dolin, MD;  Location: [facility];  Service: Endoscopy;  Laterality: N/A;  7:30am  . CYST EXCISION Left 07/19/2016   Procedure: CYST REMOVAL LEFT RING FINGER;  Surgeon: Carole Civil, MD;  Location: [facility];  Service: Orthopedics;  Laterality: Left;  . CYST REMOVAL LEG Right    foot  . ESOPHAGOGASTRODUODENOSCOPY N/A 10/21/2014   RMR: Small hiatal hernia; otherwise normal EGD status post passage of a Maloney dilator.   Marland Kitchen MALONEY DILATION N/A 10/21/2014   Procedure: [name] DILATION;  Surgeon: Daneil Dolin, MD;  Location: [facility];  Service: Endoscopy;  Laterality: N/A;  . MIDDLE EAR SURGERY Right    patched hole in ear drum  . POLYPECTOMY  09/23/2017  Procedure: POLYPECTOMY;  Surgeon: Daneil Dolin, MD;  Location: [facility];  Service: Endoscopy;;  polyp hepatic flexure polyp cs, splenic flexure polyp cs, rectal polyp cs     OB History   None      Home Medications    Prior to Admission medications   Medication Sig Start Date End Date Taking? Authorizing Provider  aspirin EC 81 MG tablet Take 81 mg by mouth daily.    [provider]  Cholecalciferol (VITAMIN D-3) 1000 UNITS CAPS Take 1,000 Units by mouth daily.     [provider]  fexofenadine (ALLEGRA) 60 MG tablet Take 1 tablet (60 mg total) by mouth 2  (two) times daily. 01/03/18   Lily Kocher, PA-C  fluticasone (FLOVENT HFA) 220 MCG/ACT inhaler Inhale 2 puffs into the lungs 2 (two) times daily.     [provider]  glucose blood (ACCU-CHEK COMPACT PLUS) test strip Use as instructed 05/18/15   Cassandria Anger, MD  HYDROcodone-acetaminophen (NORCO) 7.5-325 MG tablet Take 1 tablet by mouth every 6 (six) hours as needed for moderate pain. 07/19/16   Carole Civil, MD  insulin aspart (NOVOLOG FLEXPEN) 100 UNIT/ML FlexPen Inject 25-31 Units into the skin 3 (three) times daily with meals. 11/13/17   Cassandria Anger, MD  Insulin Glargine (LANTUS SOLOSTAR) 100 UNIT/ML Solostar Pen Inject 80 Units into the skin daily at 10 pm. 11/13/17   Nida, Marella Chimes, MD  levothyroxine (SYNTHROID, LEVOTHROID) 75 MCG tablet Take 1 tablet (75 mcg total) by mouth daily before breakfast. 09/10/17   Nida, Marella Chimes, MD  liraglutide (VICTOZA) 18 MG/3ML SOPN Inject 0.2 mLs (1.2 mg total) into the skin daily. 11/11/17   Cassandria Anger, MD  lisinopril-hydrochlorothiazide (PRINZIDE,ZESTORETIC) 20-25 MG tablet TAKE ONE TABLET BY MOUTH EVERY DAY. 05/15/17   Cassandria Anger, MD  Melatonin 5 MG TABS Take 5 mg by mouth at bedtime.    [provider]  metFORMIN (GLUCOPHAGE-XR) 500 MG 24 hr tablet TAKE ONE TABLET (500MG  TOTAL) BY MOUTH TWO TIMES DAILY AFTER A MEAL 12/13/17   Nida, Marella Chimes, MD  pantoprazole (PROTONIX) 40 MG tablet TAKE ONE (1) TABLET EACH DAY 04/12/17   Mahala Menghini, PA-C  simvastatin (ZOCOR) 10 MG tablet TAKE ONE TABLET BY MOUTH EVERY NIGHT AT BEDTIME 04/12/17   Cassandria Anger, MD  vitamin B-12 (CYANOCOBALAMIN) 500 MCG tablet Take 500 mcg by mouth daily.    [provider]    Family History Family History  Problem Relation Age of Onset  . Colon cancer Mother        diagnosed age 5, underwent surgical resection, metastatic disease several years later  . Diabetes Mother   . Heart attack  Father   . Diabetes Sister   . Hypothyroidism Brother   . Diabetes Sister   . Diabetes Sister   . Diabetes Sister   . Hypothyroidism Sister     Social History Social History   Tobacco Use  . Smoking status: Never Smoker  . Smokeless tobacco: Never Used  Substance Use Topics  . Alcohol use: No    Alcohol/week: 0.0 oz  . Drug use: No     Allergies   Peanut-containing drug products   Review of Systems Review of Systems  Constitutional: Negative for activity change.       All ROS Neg except as noted in HPI  HENT: Negative for nosebleeds.   Eyes: Positive for redness and itching. Negative for photophobia and discharge.  Respiratory: Negative for cough, shortness of breath and wheezing.   Cardiovascular: Negative for chest pain and palpitations.  Gastrointestinal: Negative for abdominal pain and blood in stool.  Genitourinary: Negative for dysuria, frequency and hematuria.  Musculoskeletal: Negative for arthralgias, back pain and neck pain.  Skin: Negative.   Neurological: Negative for dizziness, seizures and speech difficulty.  Psychiatric/Behavioral: Negative for confusion and hallucinations.     Physical Exam Updated Vital Signs BP (!) 155/90 (BP Location: Right Arm)   Pulse 95   Temp 98 F (36.7 C) (Oral)   Resp 18   SpO2 96%   Physical Exam  Constitutional: She is oriented to person, place, and time. She appears well-developed and well-nourished.  Non-toxic appearance.  HENT:  Head: Normocephalic.  Right Ear: Tympanic membrane and external ear normal.  Left Ear: Tympanic membrane and external ear normal.  Eyes: Pupils are equal, round, and reactive to light. EOM and lids are normal. Right eye exhibits chemosis. Right eye exhibits no hordeolum. Left eye exhibits no hordeolum. Right conjunctiva is injected. Right conjunctiva has no hemorrhage. Left conjunctiva is not injected. Left conjunctiva has no hemorrhage. No scleral icterus.  Swelling of the right lower  lid.  Neck: Normal range of motion. Neck supple. Carotid bruit is not present.  Cardiovascular: Normal rate, regular rhythm, normal heart sounds, intact distal pulses and normal pulses.  Pulmonary/Chest: Breath sounds normal. No respiratory distress.  Abdominal: Soft. Bowel sounds are normal. There is no tenderness. There is no guarding.  Musculoskeletal: Normal range of motion.  Lymphadenopathy:       Head (right side): No submandibular adenopathy present.       Head (left side): No submandibular adenopathy present.    She has no cervical adenopathy.  Neurological: She is alert and oriented to person, place, and time. She has normal strength. No cranial nerve deficit or sensory deficit.  Skin: Skin is warm and dry.  Psychiatric: She has a normal mood and affect. Her speech is normal.  Nursing note and vitals reviewed.    ED Treatments / Results  Labs (all labs ordered are listed, but only abnormal results are displayed) Labs Reviewed  URINALYSIS, ROUTINE W REFLEX MICROSCOPIC - Abnormal; Notable for the following components:      Result Value   APPearance HAZY (*)    Glucose, UA >=500 (*)    Leukocytes, UA SMALL (*)    All other components within normal limits    EKG None  Radiology No results found.  Procedures Procedures (including critical care time)  Medications Ordered in ED Medications  neomycin-polymyxin b-dexamethasone (MAXITROL) ophthalmic suspension 2 drop (has no administration in time range)  loratadine (CLARITIN) tablet 10 mg (has no administration in time range)     Initial Impression / Assessment and Plan / ED Course  I have reviewed the triage vital signs and the nursing notes.  Pertinent labs & imaging results that were available during my care of the patient were reviewed by me and considered in my medical decision making (see chart for details).       Final Clinical Impressions(s) / ED Diagnoses MDM  Vital signs within normal limits.  Pulse  oximetry is 96% on room air.  Within normal limits by my interpretation.  The patient was outside for an extended period of time on yesterday and was exposed to paint.  Today she had itching in her eye.  The patient states she had a lot of sweating on yesterday.  The examination shows a  chemosis of the right eye.  There is also swelling of the lower lid on the right eye.  No significant swelling on the left. I have asked the patient to use cool compresses.  I have given her Maxitrol eyedrops to use.  I have asked her to also use Allegra 2 times daily.  I have asked the patient to wash hands frequently as this could be a precursor to conjunctivitis.  Patient is in agreement with this plan, and will see Dr. Luan Pulling for additional evaluation and management if not improving.  UA elevated glucose in the urine, but no evidence of major infection.  No evidence for significant dehydration.   Final diagnoses:  Chemosis of right conjunctiva    ED Discharge Orders        Ordered    fexofenadine (ALLEGRA) 60 MG tablet  2 times daily     01/03/18 1148       Lily Kocher, PA-C 01/03/18 1201    Virgel Manifold, MD 01/03/18 1735

## 2018-01-03 NOTE — ED Triage Notes (Signed)
Pt reports she was cooking this morning approx 15 mins ago and below her right eye itched and she scratched it and then eye began swelling. Pt then states I feel dehydrated can you check me for that. States she was working in yard yesterday in heat and vomited

## 2018-01-06 DIAGNOSIS — L03031 Cellulitis of right toe: Secondary | ICD-10-CM | POA: Diagnosis not present

## 2018-01-06 DIAGNOSIS — B351 Tinea unguium: Secondary | ICD-10-CM | POA: Diagnosis not present

## 2018-01-06 DIAGNOSIS — L03032 Cellulitis of left toe: Secondary | ICD-10-CM | POA: Diagnosis not present

## 2018-01-07 DIAGNOSIS — T781XXA Other adverse food reactions, not elsewhere classified, initial encounter: Secondary | ICD-10-CM | POA: Diagnosis not present

## 2018-01-07 DIAGNOSIS — E669 Obesity, unspecified: Secondary | ICD-10-CM | POA: Diagnosis not present

## 2018-01-07 DIAGNOSIS — B029 Zoster without complications: Secondary | ICD-10-CM | POA: Diagnosis not present

## 2018-01-07 DIAGNOSIS — I10 Essential (primary) hypertension: Secondary | ICD-10-CM | POA: Diagnosis not present

## 2018-02-05 DIAGNOSIS — E1165 Type 2 diabetes mellitus with hyperglycemia: Secondary | ICD-10-CM | POA: Diagnosis not present

## 2018-02-05 DIAGNOSIS — Z794 Long term (current) use of insulin: Secondary | ICD-10-CM | POA: Diagnosis not present

## 2018-02-05 DIAGNOSIS — E118 Type 2 diabetes mellitus with unspecified complications: Secondary | ICD-10-CM | POA: Diagnosis not present

## 2018-02-06 LAB — COMPLETE METABOLIC PANEL WITH GFR
AG RATIO: 1.7 (calc) (ref 1.0–2.5)
ALT: 45 U/L — ABNORMAL HIGH (ref 6–29)
AST: 32 U/L (ref 10–35)
Albumin: 4.3 g/dL (ref 3.6–5.1)
Alkaline phosphatase (APISO): 82 U/L (ref 33–130)
BUN: 16 mg/dL (ref 7–25)
CALCIUM: 9.4 mg/dL (ref 8.6–10.4)
CO2: 27 mmol/L (ref 20–32)
Chloride: 103 mmol/L (ref 98–110)
Creat: 0.72 mg/dL (ref 0.50–1.05)
GFR, EST AFRICAN AMERICAN: 108 mL/min/{1.73_m2} (ref >=60)
GFR, EST NON AFRICAN AMERICAN: 94 mL/min/{1.73_m2} (ref >=60)
GLOBULIN: 2.5 g/dL (ref 1.9–3.7)
Glucose, Bld: 141 mg/dL — ABNORMAL HIGH (ref 65–139)
POTASSIUM: 4 mmol/L (ref 3.5–5.3)
SODIUM: 139 mmol/L (ref 135–146)
Total Bilirubin: 0.5 mg/dL (ref 0.2–1.2)
Total Protein: 6.8 g/dL (ref 6.1–8.1)

## 2018-02-06 LAB — HEMOGLOBIN A1C
EAG (MMOL/L): 12.8 (calc)
Hgb A1c MFr Bld: 9.7 % of total Hgb — ABNORMAL HIGH (ref <5.7)
Mean Plasma Glucose: 232 (calc)

## 2018-02-11 ENCOUNTER — Encounter: Payer: Self-pay | Admitting: "Endocrinology

## 2018-02-11 ENCOUNTER — Ambulatory Visit: Payer: Medicaid Other | Admitting: "Endocrinology

## 2018-02-11 VITALS — BP 137/82 | HR 72 | Ht 65.0 in | Wt 246.0 lb

## 2018-02-11 DIAGNOSIS — I10 Essential (primary) hypertension: Secondary | ICD-10-CM

## 2018-02-11 DIAGNOSIS — E1165 Type 2 diabetes mellitus with hyperglycemia: Secondary | ICD-10-CM | POA: Diagnosis not present

## 2018-02-11 DIAGNOSIS — Z6838 Body mass index (BMI) 38.0-38.9, adult: Secondary | ICD-10-CM | POA: Diagnosis not present

## 2018-02-11 DIAGNOSIS — Z794 Long term (current) use of insulin: Secondary | ICD-10-CM

## 2018-02-11 DIAGNOSIS — IMO0002 Reserved for concepts with insufficient information to code with codable children: Secondary | ICD-10-CM

## 2018-02-11 DIAGNOSIS — E039 Hypothyroidism, unspecified: Secondary | ICD-10-CM | POA: Diagnosis not present

## 2018-02-11 DIAGNOSIS — E118 Type 2 diabetes mellitus with unspecified complications: Secondary | ICD-10-CM | POA: Diagnosis not present

## 2018-02-11 DIAGNOSIS — E782 Mixed hyperlipidemia: Secondary | ICD-10-CM | POA: Diagnosis not present

## 2018-02-11 MED ORDER — SEMAGLUTIDE(0.25 OR 0.5MG/DOS) 2 MG/1.5ML ~~LOC~~ SOPN: 0.5000 mg | PEN_INJECTOR | SUBCUTANEOUS | 3 refills | Status: DC

## 2018-02-11 MED ORDER — INSULIN ASPART 100 UNIT/ML FLEXPEN: 28.0000 [IU] | PEN_INJECTOR | Freq: Three times a day (TID) | SUBCUTANEOUS | 2 refills | Status: DC

## 2018-02-11 NOTE — Patient Instructions (Signed)

## 2018-02-11 NOTE — Progress Notes (Signed)
Endocrinology follow-up note   Subjective:    Patient ID: Sandy Valencia, female    DOB: [birth year], PCP Sinda Du, MD   Past Medical History:  Diagnosis Date  . Arthritis   . Asthma   . Diabetes mellitus   . Fatty liver   . GERD (gastroesophageal reflux disease)   . Hypercholesteremia   . Hypertension   . Hypothyroidism   . Sleep apnea    pt siad, "i had small amount but could not tolerate CPAP and they said it was ok since it was mild.   Past Surgical History:  Procedure Laterality Date  . ABDOMINAL HYSTERECTOMY    . CHOLECYSTECTOMY    . COLONOSCOPY N/A 09/22/2012   RMR: colonic polyps -removed as described above. tubular adenoma, next TCS 09/2017  . COLONOSCOPY WITH PROPOFOL N/A 09/23/2017   Procedure: COLONOSCOPY WITH PROPOFOL;  Surgeon: Daneil Dolin, MD;  Location: [facility];  Service: Endoscopy;  Laterality: N/A;  7:30am  . CYST EXCISION Left 07/19/2016   Procedure: CYST REMOVAL LEFT RING FINGER;  Surgeon: Carole Civil, MD;  Location: [facility];  Service: Orthopedics;  Laterality: Left;  . CYST REMOVAL LEG Right    foot  . ESOPHAGOGASTRODUODENOSCOPY N/A 10/21/2014   RMR: Small hiatal hernia; otherwise normal EGD status post passage of a Maloney dilator.   Marland Kitchen MALONEY DILATION N/A 10/21/2014   Procedure: [name] DILATION;  Surgeon: Daneil Dolin, MD;  Location: [facility];  Service: Endoscopy;  Laterality: N/A;  . MIDDLE EAR SURGERY Right    patched hole in ear drum  . POLYPECTOMY  09/23/2017   Procedure: POLYPECTOMY;  Surgeon: Daneil Dolin, MD;  Location: [facility];  Service: Endoscopy;;  polyp hepatic flexure polyp cs, splenic flexure polyp cs, rectal polyp cs   Social History   Socioeconomic History  . Marital status: Divorced    Spouse name: Not on file  . Number of children: Not on file  . Years of education: Not on file  . Highest education level: Not on file  Occupational History  . Not on file  Social Needs  . Financial resource  strain: Not on file  . Food insecurity:    Worry: Not on file    Inability: Not on file  . Transportation needs:    Medical: Not on file    Non-medical: Not on file  Tobacco Use  . Smoking status: Never Smoker  . Smokeless tobacco: Never Used  Substance and Sexual Activity  . Alcohol use: No    Alcohol/week: 0.0 standard drinks  . Drug use: No  . Sexual activity: Yes    Birth control/protection: Surgical  Lifestyle  . Physical activity:    Days per week: Not on file    Minutes per session: Not on file  . Stress: Not on file  Relationships  . Social connections:    Talks on phone: Not on file    Gets together: Not on file    Attends religious service: Not on file    Active member of club or organization: Not on file    Attends meetings of clubs or organizations: Not on file    Relationship status: Not on file  Other Topics Concern  . Not on file  Social History Narrative  . Not on file   Outpatient Encounter Medications as of 02/11/2018  Medication Sig  . aspirin EC 81 MG tablet Take 81 mg by mouth daily.  . Cholecalciferol (VITAMIN D-3) 1000  UNITS CAPS Take 1,000 Units by mouth daily.   . fexofenadine (ALLEGRA) 60 MG tablet Take 1 tablet (60 mg total) by mouth 2 (two) times daily.  . fluticasone (FLOVENT HFA) 220 MCG/ACT inhaler Inhale 2 puffs into the lungs 2 (two) times daily.   Marland Kitchen glucose blood (ACCU-CHEK COMPACT PLUS) test strip Use as instructed  . HYDROcodone-acetaminophen (NORCO) 7.5-325 MG tablet Take 1 tablet by mouth every 6 (six) hours as needed for moderate pain.  Marland Kitchen insulin aspart (NOVOLOG FLEXPEN) 100 UNIT/ML FlexPen Inject 28-34 Units into the skin 3 (three) times daily with meals.  . Insulin Glargine (LANTUS SOLOSTAR) 100 UNIT/ML Solostar Pen Inject 80 Units into the skin daily at 10 pm.  . levothyroxine (SYNTHROID, LEVOTHROID) 75 MCG tablet Take 1 tablet (75 mcg total) by mouth daily before breakfast.  . lisinopril-hydrochlorothiazide (PRINZIDE,ZESTORETIC)  20-25 MG tablet TAKE ONE TABLET BY MOUTH EVERY DAY.  . Melatonin 5 MG TABS Take 5 mg by mouth at bedtime.  . metFORMIN (GLUCOPHAGE-XR) 500 MG 24 hr tablet TAKE ONE TABLET (500MG  TOTAL) BY MOUTH TWO TIMES DAILY AFTER A MEAL  . pantoprazole (PROTONIX) 40 MG tablet TAKE ONE (1) TABLET EACH DAY  . Semaglutide (OZEMPIC) 0.25 or 0.5 MG/DOSE SOPN Inject 0.5 mg into the skin once a week.  . simvastatin (ZOCOR) 10 MG tablet TAKE ONE TABLET BY MOUTH EVERY NIGHT AT BEDTIME  . vitamin B-12 (CYANOCOBALAMIN) 500 MCG tablet Take 500 mcg by mouth daily.  . [DISCONTINUED] insulin aspart (NOVOLOG FLEXPEN) 100 UNIT/ML FlexPen Inject 25-31 Units into the skin 3 (three) times daily with meals.  . [DISCONTINUED] liraglutide (VICTOZA) 18 MG/3ML SOPN Inject 0.2 mLs (1.2 mg total) into the skin daily. (Patient not taking: Reported on 02/11/2018)   No facility-administered encounter medications on file as of 02/11/2018.    ALLERGIES: Allergies  Allergen Reactions  . Peanut-Containing Drug Products Anaphylaxis and Swelling   VACCINATION STATUS: There is no immunization history for the selected administration types on file for this patient.  Diabetes  She presents for her follow-up diabetic visit. She has type 2 diabetes mellitus. Her disease course has been worsening (She was diagnosed at approximate age of 79 years.). There are no hypoglycemic associated symptoms. Pertinent negatives for hypoglycemia include no confusion, headaches, pallor or seizures. Associated symptoms include polydipsia and polyuria. Pertinent negatives for diabetes include no chest pain and no polyphagia. There are no hypoglycemic complications. Symptoms are worsening. There are no diabetic complications. Risk factors for coronary artery disease include dyslipidemia, diabetes mellitus, hypertension and sedentary lifestyle. Current diabetic treatment includes insulin injections. She is compliant with treatment most of the time. Her weight is  fluctuating minimally. She is following a generally unhealthy diet. She never participates in exercise. Her home blood glucose trend is decreasing rapidly. Her breakfast blood glucose range is generally >200 mg/dl. Her lunch blood glucose range is generally >200 mg/dl. Her dinner blood glucose range is generally >200 mg/dl. Her bedtime blood glucose range is generally >200 mg/dl. Her overall blood glucose range is >200 mg/dl. (She returns with average blood glucose of 247 for the last 90 days, A1c of 9.7% increasing from 9.1%.) An ACE inhibitor/angiotensin II receptor blocker is being taken.  Hyperlipidemia  This is a chronic problem. The current episode started more than 1 year ago. The problem is uncontrolled. Exacerbating diseases include diabetes, hypothyroidism and obesity. Pertinent negatives include no chest pain, myalgias or shortness of breath. Current antihyperlipidemic treatment includes statins. Risk factors for coronary artery disease include diabetes  mellitus, dyslipidemia, hypertension, obesity, a sedentary lifestyle and post-menopausal.  Hypertension  This is a chronic problem. The current episode started more than 1 year ago. The problem is uncontrolled. Pertinent negatives include no chest pain, headaches, palpitations or shortness of breath. Risk factors for coronary artery disease include dyslipidemia, diabetes mellitus, obesity and sedentary lifestyle. Past treatments include ACE inhibitors and diuretics.     Review of Systems  Constitutional: Negative for unexpected weight change.  HENT: Negative for trouble swallowing and voice change.   Eyes: Negative for visual disturbance.  Respiratory: Negative for cough, shortness of breath and wheezing.   Cardiovascular: Negative for chest pain, palpitations and leg swelling.  Gastrointestinal: Negative for diarrhea, nausea and vomiting.  Endocrine: Positive for polydipsia and polyuria. Negative for cold intolerance, heat intolerance and  polyphagia.  Musculoskeletal: Negative for arthralgias and myalgias.  Skin: Negative for color change, pallor, rash and wound.  Neurological: Negative for seizures and headaches.  Psychiatric/Behavioral: Negative for confusion and suicidal ideas.    Objective:    BP 137/82   Pulse 72   Ht 5\' 5"  (1.651 m)   Wt 246 lb (111.6 kg)   BMI 40.94 kg/m   Wt Readings from Last 3 Encounters:  02/11/18 246 lb (111.6 kg)  11/11/17 248 lb (112.5 kg)  09/17/17 241 lb (109.3 kg)    Physical Exam  Constitutional: She is oriented to person, place, and time. She appears well-developed.  HENT:  Head: Normocephalic and atraumatic.  Eyes: EOM are normal.  Neck: Normal range of motion. Neck supple. No tracheal deviation present. No thyromegaly present.  Cardiovascular: Normal rate.  Pulmonary/Chest: Effort normal.  Abdominal: There is no tenderness. There is no guarding.  Musculoskeletal: Normal range of motion. She exhibits no edema.  Neurological: She is alert and oriented to person, place, and time. No cranial nerve deficit. Coordination normal.  Skin: Skin is warm and dry. No rash noted. No erythema. No pallor.  Psychiatric: She has a normal mood and affect. Judgment normal.   Recent Results (from the past 2160 hour(s))  Urinalysis, Routine w reflex microscopic     Status: Abnormal   Collection Time: 01/03/18 10:50 AM  Result Value Ref Range   Color, Urine YELLOW YELLOW   APPearance HAZY (A) CLEAR   Specific Gravity, Urine 1.025 1.005 - 1.030   pH 5.0 5.0 - 8.0   Glucose, UA >=500 (A) NEGATIVE mg/dL   Hgb urine dipstick NEGATIVE NEGATIVE   Bilirubin Urine NEGATIVE NEGATIVE   Ketones, ur NEGATIVE NEGATIVE mg/dL   Protein, ur NEGATIVE NEGATIVE mg/dL   Nitrite NEGATIVE NEGATIVE   Leukocytes, UA SMALL (A) NEGATIVE   RBC / HPF 0-5 0 - 5 RBC/hpf   WBC, UA 0-5 0 - 5 WBC/hpf   Bacteria, UA NONE SEEN NONE SEEN   Squamous Epithelial / LPF 0-5 0 - 5   Mucus PRESENT    Uric Acid Crys, UA  PRESENT     Comment: Performed at [facility], [address], [place], [place] [address]  COMPLETE METABOLIC PANEL WITH GFR     Status: Abnormal   Collection Time: 02/05/18  8:30 AM  Result Value Ref Range   Glucose, Bld 141 (H) 65 - 139 mg/dL    Comment: .        Non-fasting reference interval .    BUN 16 7 - 25 mg/dL   Creat 0.72 0.50 - 1.05 mg/dL    Comment: For patients >70 years of age, the reference limit for  Creatinine is approximately 13% higher for people identified as African-American. .    GFR, Est Non African American 94 > OR = 60 mL/min/1.36m2   GFR, Est African American 108 > OR = 60 mL/min/1.98m2   BUN/Creatinine Ratio NOT APPLICABLE 6 - 22 (calc)   Sodium 139 135 - 146 mmol/L   Potassium 4.0 3.5 - 5.3 mmol/L   Chloride 103 98 - 110 mmol/L   CO2 27 20 - 32 mmol/L   Calcium 9.4 8.6 - 10.4 mg/dL   Total Protein 6.8 6.1 - 8.1 g/dL   Albumin 4.3 3.6 - 5.1 g/dL   Globulin 2.5 1.9 - 3.7 g/dL (calc)   AG Ratio 1.7 1.0 - 2.5 (calc)   Total Bilirubin 0.5 0.2 - 1.2 mg/dL   Alkaline phosphatase (APISO) 82 33 - 130 U/L   AST 32 10 - 35 U/L   ALT 45 (H) 6 - 29 U/L  Hemoglobin A1c     Status: Abnormal   Collection Time: 02/05/18  8:30 AM  Result Value Ref Range   Hgb A1c MFr Bld 9.7 (H) <5.7 % of total Hgb    Comment: For someone without known diabetes, a hemoglobin A1c value of 6.5% or greater indicates that they may have  diabetes and this should be confirmed with a follow-up  test. . For someone with known diabetes, a value <7% indicates  that their diabetes is well controlled and a value  greater than or equal to 7% indicates suboptimal  control. A1c targets should be individualized based on  duration of diabetes, age, comorbid conditions, and  other considerations. . Currently, no consensus exists regarding use of hemoglobin A1c for diagnosis of diabetes for children. .    Mean Plasma Glucose 232 (calc)   eAG (mmol/L) 12.8 (calc)   Lipid Panel      Component Value Date/Time   CHOL 133 04/19/2017 0759   TRIG 62 04/19/2017 0759   HDL 69 04/19/2017 0759   CHOLHDL 1.9 04/19/2017 0759   VLDL 10 03/07/2016 0705   LDLCALC 50 04/19/2017 0759      Assessment & Plan:   1. Uncontrolled type 2 diabetes mellitus with complication, with long-term current use of insulin ([place])  -Her  diabetes is  complicated by history of noncompliance (she recently showed better engagement) and patient remains at  extremely high risk for more acute and chronic complications of diabetes which include CAD, CVA, CKD, retinopathy, and neuropathy. These are all discussed in detail with the patient.  -She returns with persistent and significantly above target blood glucose readings averaging greater than 200 mg/dL.    -Her recent labs show A1c of 9.1%, unchanged from last visit. - I have re-counseled the patient on diet management and weight loss  by adopting a carbohydrate restricted / protein rich  Diet.  -  Suggestion is made for her to avoid simple carbohydrates  from her diet including Cakes, Sweet Desserts / Pastries, Ice Cream, Soda (diet and regular), Sweet Tea, Candies, Chips, Cookies, Store Bought Juices, Alcohol in Excess of  1-2 drinks a day, Artificial Sweeteners, and "Sugar-free" Products. This will help patient to have stable blood glucose profile and potentially avoid unintended weight gain.   - Patient is advised to stick to a routine mealtimes to eat 3 meals  a day and avoid unnecessary snacks ( to snack only to correct hypoglycemia).  - I have approached patient with the following individualized plan to manage diabetes and patient agrees.  - Based on  her presentation with her higher A1c of 9.7% consistent with average blood glucose of 247 mg/dL, she is approached for higher dose of insulin.   -I advised her to continue Lantus 80 units nightly, increase NovoLog to 28   units 3 times a day before meals plus correction.  She is advised to continue to  use her continuous glucose monitoring device.   - She has tolerated metformin.  I advised her to continue  metformin 500 mg ER to twice a day after breakfast and supper.  -She did not tolerate Victoza has discontinued it.  I discussed and switched to Ozempic 0.25 mg weekly x2 weeks followed by 0.5 mg subcutaneously weekly if tolerated.    - Patient specific target  for A1c; LDL, HDL, Triglycerides, and  Waist Circumference were discussed in detail.  2) BP/HTN: Her blood pressure is controlled to target.    I advised her to continue hydrochlorothiazide/lisinopril 25/20 mg by mouth daily. I counseled her on salt restriction.   3) Lipids/HPL: Controlled with LDL of 50 from November 2018.  She is advised to continue simvastatin 10 mg p.o. Nightly.  4)  Weight/Diet: She has had no success in weight control.  CDE consult in progress, exercise, and carbohydrates information provided. -She decided against bariatric surgery.    5. Personal history of noncompliance with medical treatment, presenting hazards to health -I counseled her on the need to stay focused and treat diabetes intensively to  avoid competitions.  5) Hypothyroidism:  -Her recent labs are consistent with appropriate thyroid hormone replacement. -I advised her to continue levothyroxine 75 mcg p.o. early before breakfast.      - We discussed about correct intake of levothyroxine, at fasting, with water, separated by at least 30 minutes from breakfast, and separated by more than 4 hours from calcium, iron, multivitamins, acid reflux medications (PPIs). -Patient is made aware of the fact that thyroid hormone replacement is needed for life, dose to be adjusted by periodic monitoring of thyroid function tests.   6) Chronic Care/Health Maintenance:  -Patient is on ACEI/ARB and Statin medications and encouraged to continue to follow up with Ophthalmology, Podiatrist at least yearly or according to recommendations, and advised to  stay  away from smoking. I have recommended yearly flu vaccine and pneumonia vaccination at least every 5 years; moderate intensity exercise for up to 150 minutes weekly; and  sleep for at least 7 hours a day.  - I advised patient to maintain close follow up with Sinda Du, MD for primary care needs.  - Time spent with the patient: 25 min, of which >50% was spent in reviewing her blood glucose logs , discussing her hypo- and hyper-glycemic episodes, reviewing her current and  previous labs and insulin doses and developing a plan to avoid hypo- and hyper-glycemia. Please refer to Patient Instructions for Blood Glucose Monitoring and Insulin/Medications Dosing Guide"  in media tab for additional information. Gillian Scarce participated in the discussions, expressed understanding, and voiced agreement with the above plans.  All questions were answered to her satisfaction. she is encouraged to contact clinic should she have any questions or concerns prior to her return visit.   Follow up plan: -Return in about 3 months (around 05/13/2018) for Meter, and Logs.  Glade Lloyd, MD Phone: [phone]  Fax: [phone]   -  This note was partially dictated with voice recognition software. Similar sounding words can be transcribed inadequately or may not  be corrected upon review.  02/11/2018, 10:11 AM

## 2018-02-18 ENCOUNTER — Encounter: Payer: Self-pay | Admitting: Allergy & Immunology

## 2018-02-18 ENCOUNTER — Ambulatory Visit: Payer: Medicaid Other | Admitting: Allergy & Immunology

## 2018-02-18 VITALS — BP 142/78 | HR 102 | Temp 98.2°F | Resp 18 | Ht 65.0 in | Wt 247.6 lb

## 2018-02-18 DIAGNOSIS — K219 Gastro-esophageal reflux disease without esophagitis: Secondary | ICD-10-CM

## 2018-02-18 DIAGNOSIS — J4520 Mild intermittent asthma, uncomplicated: Secondary | ICD-10-CM | POA: Diagnosis not present

## 2018-02-18 DIAGNOSIS — T781XXD Other adverse food reactions, not elsewhere classified, subsequent encounter: Secondary | ICD-10-CM | POA: Diagnosis not present

## 2018-02-18 DIAGNOSIS — R067 Sneezing: Secondary | ICD-10-CM

## 2018-02-18 MED ORDER — ALBUTEROL SULFATE HFA 108 (90 BASE) MCG/ACT IN AERS: 2.0000 | INHALATION_SPRAY | Freq: Four times a day (QID) | RESPIRATORY_TRACT | 1 refills | Status: DC | PRN

## 2018-02-18 MED ORDER — EPINEPHRINE 0.3 MG/0.3ML IJ SOAJ: 0.3000 mg | Freq: Once | INTRAMUSCULAR | 1 refills | Status: AC

## 2018-02-18 NOTE — Progress Notes (Signed)
NEW PATIENT  Date of Service/Encounter:  02/18/18  Referring provider: Sinda Du, MD   Assessment:   Mild intermittent asthma, uncomplicated  Adverse food reaction (peanut, tree nut, kiwi, seafood) - with negative skin testing today (labs pending)  Gastroesophageal reflux disease - controlled with a pantoprazole  Sneezing - with negative testing to the environmental testing at this time   Plan/Recommendations:   1. Mild intermittent asthma, uncomplicated - Lung testing looks good today. - I think we are going to need to get you a rescue inhaler, but since you are being so stubborn, just give [place] a call when you are having breathing problems over the winter.  2. Adverse food reaction (peanuts, tree nuts, seafood, kiwi) - Testing was negative to the most common food allergens today (peanut, sesame, tree nuts, soy, fish mix, shellfish mix, wheat, milk, casein, egg) - We will get blood work confirm this.  - NO PEANUT BUTTER until you hear from [place]. - We will call you in 1-2 weeks with the results of the testing.  - If the testing is negative, we will bring you in for a challenge in the office.  - In the meantime, pick up your new EpiPen to have it on hand if needed.   3. Gastroesophageal reflux disease - Continue with pantoprazole 40mg  daily.  4. Sneezing - Testing was negative to the entire environmental panel.  5. Return in about 3 months (around 05/20/2018).  Subjective:   ZOI DEVINE is a 57 y.o. female presenting today for evaluation of  Chief Complaint  Patient presents with  . Establish Care  . Allergic Reaction    Peanut, Kiwi    Kambria W Amberg has a history of the following: Patient Active Problem List   Diagnosis Date Noted  . History of colonic polyps 08/26/2017  . Hypothyroidism 04/22/2017  . Mixed hyperlipidemia 12/31/2016  . Digital mucous cyst of finger of left hand   . Uncontrolled type 2 diabetes mellitus with complication, with  long-term current use of insulin ([place]) 05/18/2015  . Essential hypertension, benign 05/18/2015  . Class 2 severe obesity due to excess calories with serious comorbidity and body mass index (BMI) of 38.0 to 38.9 in adult ([place]) 05/18/2015  . Personal history of noncompliance with medical treatment, presenting hazards to health 05/18/2015  . GERD (gastroesophageal reflux disease) 02/14/2015  . Anemia, iron deficiency 02/14/2015  . Fatty liver 02/14/2015  . Hiatal hernia   . Dysphagia, pharyngoesophageal phase   . Dysphagia 10/11/2014  . Dyspepsia 10/11/2014  . Family history of colon cancer 09/03/2012    History obtained from: chart review and patient.  Laylanie W Emory was referred by Sinda Du, MD.     Danae is a 57 y.o. female presenting for an evaluation of food allergies, but it seems that she also has a history of asthma and rhinitis symptoms as well. She is well known to me through his grandson, who is my patient. She is very proud of her son, who just finished his [employer].   She tells me today that she reacted to peanuts last year. She loves peanuts but has not had any since that time. When it happened, she and her grandson when to [place] to eat. He wanted to do get frozen yogurt. She ate one walnut and then within three minutes her tongue started itching. Then she started having ear itching and she developed a funny feeling in her eyes. She drove to the the ED thereafter.  This was May 2017. In the ED, she had IV steroids as well as famotidine and diphenhydramine. She does have an expired EpiPen.   She does report throat clearing with seafood. She does very little soy to her knowledge. She does tolerate sesame. She tolerates wheat, egg, and cow's milk without a problem.   She also reacted to kiwi when the juice got on her hands. She reacted with ocular swelling, which resolved with some pills in the emergency room. She has avoided kiwi since that time. She does eat  bananas. She does not eat mangos directly, but she will eat them if they are in something.   Lamae  was on an inhaler, but then she developed thrush multiple times. She has not used it since that time. She was first prescribed it in case she gets bronchitis. She was on Qvar and hen changed to Flovent. She stopped using it after the recurrent thrush episodes. She mostly is able to perform ADLs without a problem. She has never been on a rescue inhaler (confirmed with pictures of the inhalers). There are no albuterol inhalers on her medication list at all. She denies night time coughing and daytime symptoms at all. She does get out of breath but this is only with intense physical activity. She has not taken the Flovent in 3 or 4 months or more. She does not really want to take a regular inhaler at all and is not interested in discussing a rescue inhaler today either.   She does have sneezing occasionally, but otherwise no allergic rhinitis symptoms. She does have bronchitis symptoms in the middle of the winter. She does not take anything for her symptoms since they are rather transient in nature.   She is on pantoprazole 40mg  daily for her GERD. Otherwise, there is no history of other atopic diseases, including drug allergies, stinging insect allergies, eczema or urticaria. There is no significant infectious history. Vaccinations are up to date.    Past Medical History: Patient Active Problem List   Diagnosis Date Noted  . History of colonic polyps 08/26/2017  . Hypothyroidism 04/22/2017  . Mixed hyperlipidemia 12/31/2016  . Digital mucous cyst of finger of left hand   . Uncontrolled type 2 diabetes mellitus with complication, with long-term current use of insulin ([place]) 05/18/2015  . Essential hypertension, benign 05/18/2015  . Class 2 severe obesity due to excess calories with serious comorbidity and body mass index (BMI) of 38.0 to 38.9 in adult ([place]) 05/18/2015  . Personal history of noncompliance  with medical treatment, presenting hazards to health 05/18/2015  . GERD (gastroesophageal reflux disease) 02/14/2015  . Anemia, iron deficiency 02/14/2015  . Fatty liver 02/14/2015  . Hiatal hernia   . Dysphagia, pharyngoesophageal phase   . Dysphagia 10/11/2014  . Dyspepsia 10/11/2014  . Family history of colon cancer 09/03/2012    Medication List:  Allergies as of 02/18/2018      Reactions   Peanut-containing Drug Products Anaphylaxis, Swelling      Medication List        Accurate as of 02/18/18  4:52 PM. Always use your most recent med list.          albuterol 108 (90 Base) MCG/ACT inhaler Commonly known as:  PROVENTIL HFA;VENTOLIN HFA Inhale 2 puffs into the lungs every 6 (six) hours as needed for wheezing or shortness of breath.   aspirin EC 81 MG tablet Take 81 mg by mouth daily.   EPINEPHrine 0.3 mg/0.3 mL Soaj injection Commonly known  as:  EPI-PEN Inject 0.3 mLs (0.3 mg total) into the muscle once for 1 dose.   fexofenadine 60 MG tablet Commonly known as:  ALLEGRA Take 1 tablet (60 mg total) by mouth 2 (two) times daily.   fluticasone 220 MCG/ACT inhaler Commonly known as:  FLOVENT HFA Inhale 2 puffs into the lungs 2 (two) times daily.   glucose blood test strip Use as instructed   HYDROcodone-acetaminophen 7.5-325 MG tablet Commonly known as:  NORCO Take 1 tablet by mouth every 6 (six) hours as needed for moderate pain.   insulin aspart 100 UNIT/ML FlexPen Commonly known as:  NOVOLOG Inject 28-34 Units into the skin 3 (three) times daily with meals.   Insulin Glargine 100 UNIT/ML Solostar Pen Commonly known as:  LANTUS Inject 80 Units into the skin daily at 10 pm.   levothyroxine 75 MCG tablet Commonly known as:  SYNTHROID, LEVOTHROID Take 1 tablet (75 mcg total) by mouth daily before breakfast.   lisinopril-hydrochlorothiazide 20-25 MG tablet Commonly known as:  PRINZIDE,ZESTORETIC TAKE ONE TABLET BY MOUTH EVERY DAY.   Melatonin 5 MG  Tabs Take 5 mg by mouth at bedtime.   metFORMIN 500 MG 24 hr tablet Commonly known as:  GLUCOPHAGE-XR TAKE ONE TABLET (500MG  TOTAL) BY MOUTH TWO TIMES DAILY AFTER A MEAL   pantoprazole 40 MG tablet Commonly known as:  PROTONIX TAKE ONE (1) TABLET EACH DAY   Semaglutide(0.25 or 0.5MG /DOS) 2 MG/1.5ML Sopn Inject 0.5 mg into the skin once a week.   simvastatin 10 MG tablet Commonly known as:  ZOCOR TAKE ONE TABLET BY MOUTH EVERY NIGHT AT BEDTIME   vitamin B-12 500 MCG tablet Commonly known as:  CYANOCOBALAMIN Take 500 mcg by mouth daily.   Vitamin D-3 1000 units Caps Take 1,000 Units by mouth daily.       Birth History: non-contributory  Developmental History: non-contributory.   Past Surgical History: Past Surgical History:  Procedure Laterality Date  . ABDOMINAL HYSTERECTOMY    . CHOLECYSTECTOMY    . COLONOSCOPY N/A 09/22/2012   RMR: colonic polyps -removed as described above. tubular adenoma, next TCS 09/2017  . COLONOSCOPY WITH PROPOFOL N/A 09/23/2017   Procedure: COLONOSCOPY WITH PROPOFOL;  Surgeon: Daneil Dolin, MD;  [facility];  Service: Endoscopy;  Laterality: N/A;  7:30am  . CYST EXCISION Left 07/19/2016   Procedure: CYST REMOVAL LEFT RING FINGER;  Surgeon: Carole Civil, MD;  Location: [facility];  Service: Orthopedics;  Laterality: Left;  . CYST REMOVAL LEG Right    foot  . ESOPHAGOGASTRODUODENOSCOPY N/A 10/21/2014   RMR: Small hiatal hernia; otherwise normal EGD status post passage of a Maloney dilator.   Marland Kitchen MALONEY DILATION N/A 10/21/2014   Procedure: [name] DILATION;  Surgeon: Daneil Dolin, MD;  [facility];  Service: Endoscopy;  Laterality: N/A;  . MIDDLE EAR SURGERY Right    patched hole in ear drum  . POLYPECTOMY  09/23/2017   Procedure: POLYPECTOMY;  Surgeon: Daneil Dolin, MD;  [facility];  Service: Endoscopy;;  polyp hepatic flexure polyp cs, splenic flexure polyp cs, rectal polyp cs     Family  History: Family History  Problem Relation Age of Onset  . Colon cancer Mother        diagnosed age 29, underwent surgical resection, metastatic disease several years later  . Diabetes Mother   . Heart attack Father   . Diabetes Sister   . Hypothyroidism Brother   . Diabetes Sister   .  Diabetes Sister   . Diabetes Sister   . Hypothyroidism Sister      Social History: Ayana lives at home with her grandson who is 25 years old. They live in an 56yo home with wood flooring throughout the home. They have gas heating and window units for cooling. There are no animals inside or outside of the home. There are dust mite coverings on the bedding. There is no smoking exposure in the home.  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     Review of Systems: a 14-point review of systems is pertinent for what is mentioned in HPI.  Otherwise, all other systems were negative. Constitutional: negative other than that listed in the HPI Eyes: negative other than that listed in the HPI Ears, nose, mouth, throat, and face: negative other than that listed in the HPI Respiratory: negative other than that listed in the HPI Cardiovascular: negative other than that listed in the HPI Gastrointestinal: negative other than that listed in the HPI Genitourinary: negative other than that listed in the HPI Integument: negative other than that listed in the HPI Hematologic: negative other than that listed in the HPI Musculoskeletal: negative other than that listed in the HPI Neurological: negative other than that listed in the HPI Allergy/Immunologic: negative other than that listed in the HPI    Objective:   Blood pressure (!) 142/78, pulse (!) 102, temperature 98.2 F (36.8 C), temperature source Oral, resp. rate 18, height 5\' 5"  (1.651 m), weight 247 lb 9.6 oz (112.3 kg), SpO2 97 %. Body mass index is 41.2 kg/m.   Physical Exam:  General: Alert,  interactive, in no acute distress. Pleasant female. Talkative.  Eyes: No conjunctival injection present on the right, No conjunctival injection present on the left, No conjunctival injection bilaterally, no discharge on the right, no discharge on the left and no Horner-Trantas dots present. PERRL bilaterally. EOMI without pain. No photophobia.  Ears: There is some scar tissue on the inferior portion of the left TM, Right TM pearly gray with normal light reflex, Left TM pearly gray with normal light reflex, Right TM intact without perforation and Left TM intact without perforation.  Nose/Throat: External nose within normal limits, nasal crease present and septum midline. Turbinates edematous without discharge. Posterior oropharynx mildly erythematous without cobblestoning in the posterior oropharynx. Tonsils 2+ without exudates.  Tongue without thrush. Neck: Supple without thyromegaly. Trachea midline. Adenopathy: no enlarged lymph nodes appreciated in the anterior cervical, occipital, axillary, epitrochlear, inguinal, or popliteal regions. Lungs: Clear to auscultation without wheezing, rhonchi or rales. No increased work of breathing. CV: Normal S1/S2. No murmurs. Capillary refill <2 seconds.  Abdomen: Nondistended, nontender. No guarding or rebound tenderness. Bowel sounds present in all fields and hypoactive  Skin: Warm and dry, without lesions or rashes. Extremities:  No clubbing, cyanosis or edema. Neuro:   Grossly intact. No focal deficits appreciated. Responsive to questions.  Diagnostic studies:   Spirometry: results normal (FEV1: 2.42/99%, FVC: 3.09/94%, FEV1/FVC: 78%).    Spirometry consistent with normal pattern.   Allergy Studies:   Indoor/Outdoor Percutaneous Adult Environmental Panel: negative to the entire panel with adequate controls.  Selected Food Panel: negative to Peanut, Soy, Shellfish Mix , Fish Mix, Cashew, [place], [place], [place], [place], [place] nut, [place], [place],  [place], [place], [place], [place], [place], [place], [place], [place], [place], [place], [place] and [place]    Allergy testing results were read and interpreted by myself, documented by clinical  staff.       Salvatore Marvel, MD [facility] [facility]

## 2018-02-18 NOTE — Patient Instructions (Addendum)
1. Mild intermittent asthma, uncomplicated - Lung testing looks good today. - I think we are going to need to get you a rescue inhaler, but since you are being so stubborn, just give [place] a call when you are having breathing problems over the winter.  2. Adverse food reaction (peanuts, tree nuts, seafood, kiwi) - Testing was negative to the most common food allergens today (peanut, sesame, tree nuts, soy, fish mix, shellfish mix, wheat, milk, casein, egg) - We will get blood work confirm this.  - NO PEANUT BUTTER until you hear from [place]. - We will call you in 1-2 weeks with the results of the testing.  - If the testing is negative, we will bring you in for a challenge in the office.  - In the meantime, pick up your new EpiPen to have it on hand if needed.   3. Gastroesophageal reflux disease - Continue with pantoprazole 40mg  daily.  4. Sneezing - Testing was negative to the entire environmental panel.  5. Return in about 3 months (around 05/20/2018).   Please inform us of any Emergency Department visits, hospitalizations, or changes in symptoms. Call us before going to the ED for breathing or allergy symptoms since we might be able to fit you in for a sick visit. Feel free to contact us anytime with any questions, problems, or concerns.  It was a pleasure to see you again today!  Websites that have reliable patient information: 1. American Academy of Asthma, Allergy, and Immunology: www.aaaai.org 2. Food Allergy Research and Education (FARE): foodallergy.org 3. Mothers of Asthmatics: http://www.asthmacommunitynetwork.org 4. American College of Allergy, Asthma, and Immunology: MonthlyElectricBill.co.uk   Make sure you are registered to vote! If you have moved or changed any of your contact information, you will need to get this updated before voting!

## 2018-02-19 DIAGNOSIS — T781XXD Other adverse food reactions, not elsewhere classified, subsequent encounter: Secondary | ICD-10-CM | POA: Diagnosis not present

## 2018-02-22 LAB — IGE+ALLERGENS ZONE 2(30)
Alternaria Alternata IgE: <0.1 kU/L
Amer Sycamore IgE Qn: <0.1 kU/L
Aspergillus Fumigatus IgE: <0.1 kU/L
Bahia Grass IgE: <0.1 kU/L
Cat Dander IgE: <0.1 kU/L
Cladosporium Herbarum IgE: <0.1 kU/L
Cockroach, American IgE: <0.1 kU/L
Common Silver Birch IgE: <0.1 kU/L
D Farinae IgE: <0.1 kU/L
Dog Dander IgE: <0.1 kU/L
Elm, American IgE: <0.1 kU/L
IGE (IMMUNOGLOBULIN E), SERUM: 20 [IU]/mL (ref 6–495)
Mucor Racemosus IgE: <0.1 kU/L
Mugwort IgE Qn: <0.1 kU/L
Pigweed, Rough IgE: <0.1 kU/L
Ragweed, Short IgE: <0.1 kU/L
Sheep Sorrel IgE Qn: <0.1 kU/L
Stemphylium Herbarum IgE: <0.1 kU/L
Sweet gum IgE RAST Ql: <0.1 kU/L
Timothy Grass IgE: <0.1 kU/L
White Mulberry IgE: <0.1 kU/L

## 2018-02-22 LAB — ALLERGY PANEL 19, SEAFOOD GROUP
Allergen Salmon IgE: <0.1 kU/L
Codfish IgE: <0.1 kU/L
F023-IgE Crab: <0.1 kU/L
F080-IgE Lobster: <0.1 kU/L
Shrimp IgE: <0.1 kU/L
Tuna: <0.1 kU/L

## 2018-02-22 LAB — ALLERGY PANEL 18, NUT MIX GROUP
F202-IgE Cashew Nut: <0.1 kU/L
Hazelnut (Filbert) IgE: <0.1 kU/L
Peanut IgE: <0.1 kU/L
Sesame Seed IgE: <0.1 kU/L

## 2018-02-22 LAB — ALLERGEN, KIWI FRUIT, F84: F084-IGE KIWI FRUIT: 2.66 kU/L — AB

## 2018-02-24 ENCOUNTER — Telehealth: Payer: Self-pay | Admitting: Allergy & Immunology

## 2018-02-24 NOTE — Telephone Encounter (Signed)
Send pt peanut butter paper work and a map to [employer].

## 2018-02-24 NOTE — Addendum Note (Signed)
Addended by: Valentina Shaggy on: 02/24/2018 08:27 AM   Modules accepted: Orders

## 2018-02-26 LAB — SPECIMEN STATUS REPORT

## 2018-02-26 LAB — ALLERGEN WALNUT F256

## 2018-03-17 ENCOUNTER — Other Ambulatory Visit: Payer: Self-pay | Admitting: "Endocrinology

## 2018-04-02 ENCOUNTER — Ambulatory Visit (INDEPENDENT_AMBULATORY_CARE_PROVIDER_SITE_OTHER): Payer: Medicaid Other

## 2018-04-02 ENCOUNTER — Encounter: Payer: Self-pay | Admitting: Orthopedic Surgery

## 2018-04-02 ENCOUNTER — Ambulatory Visit: Payer: Medicaid Other | Admitting: Orthopedic Surgery

## 2018-04-02 VITALS — BP 159/79 | HR 82 | Ht 65.0 in | Wt 242.0 lb

## 2018-04-02 DIAGNOSIS — Z6841 Body Mass Index (BMI) 40.0 and over, adult: Secondary | ICD-10-CM

## 2018-04-02 DIAGNOSIS — M67441 Ganglion, right hand: Secondary | ICD-10-CM

## 2018-04-02 NOTE — Patient Instructions (Signed)
Surgical risks Infection Recurrence Joint stiffness

## 2018-04-02 NOTE — Progress Notes (Signed)
NEW PROBLEM  OFFICE VISIT  Chief Complaint  Patient presents with  . Hand Pain    right ring finger    57 year old female with diabetes sleep apnea presents with arthritic nodule right ring finger complains of her it pain over that area of the DIP joint of the right ring finger is been there for several months if not years but is become painful ; does not affect her range of motion.  It does not affect her function.   Review of Systems  Constitutional: Negative for chills, fever and weight loss.  Respiratory: Positive for shortness of breath.   Cardiovascular: Negative for chest pain.  Neurological: Negative for tingling.     Past Medical History:  Diagnosis Date  . Angio-edema   . Arthritis   . Asthma   . Diabetes mellitus   . Fatty liver   . GERD (gastroesophageal reflux disease)   . Hypercholesteremia   . Hypertension   . Hypothyroidism   . Sleep apnea    pt siad, "i had small amount but could not tolerate CPAP and they said it was ok since it was mild.    Past Surgical History:  Procedure Laterality Date  . ABDOMINAL HYSTERECTOMY    . CHOLECYSTECTOMY    . COLONOSCOPY N/A 09/22/2012   RMR: colonic polyps -removed as described above. tubular adenoma, next TCS 09/2017  . COLONOSCOPY WITH PROPOFOL N/A 09/23/2017   Procedure: COLONOSCOPY WITH PROPOFOL;  Surgeon: Daneil Dolin, MD;  [facility];  Service: Endoscopy;  Laterality: N/A;  7:30am  . CYST EXCISION Left 07/19/2016   Procedure: CYST REMOVAL LEFT RING FINGER;  Surgeon: Carole Civil, MD;  Location: [facility];  Service: Orthopedics;  Laterality: Left;  . CYST REMOVAL LEG Right    foot  . ESOPHAGOGASTRODUODENOSCOPY N/A 10/21/2014   RMR: Small hiatal hernia; otherwise normal EGD status post passage of a Maloney dilator.   Marland Kitchen MALONEY DILATION N/A 10/21/2014   Procedure: [name] DILATION;  Surgeon: Daneil Dolin, MD;  [facility];  Service: Endoscopy;  Laterality: N/A;  . MIDDLE EAR SURGERY  Right    patched hole in ear drum  . POLYPECTOMY  09/23/2017   Procedure: POLYPECTOMY;  Surgeon: Daneil Dolin, MD;  [facility];  Service: Endoscopy;;  polyp hepatic flexure polyp cs, splenic flexure polyp cs, rectal polyp cs    Family History  Problem Relation Age of Onset  . Colon cancer Mother        diagnosed age 84, underwent surgical resection, metastatic disease several years later  . Diabetes Mother   . Heart attack Father   . Diabetes Sister   . Hypothyroidism Brother   . Diabetes Sister   . Diabetes Sister   . Diabetes Sister   . Hypothyroidism Sister    Social History   Tobacco Use  . Smoking status: Never Smoker  . Smokeless tobacco: Never Used  Substance Use Topics  . Alcohol use: No    Alcohol/week: 0.0 standard drinks  . Drug use: No    Allergies  Allergen Reactions  . Peanut-Containing Drug Products Anaphylaxis and Swelling    Current Meds  Medication Sig  . albuterol (PROVENTIL HFA) 108 (90 Base) MCG/ACT inhaler Inhale 2 puffs into the lungs every 6 (six) hours as needed for wheezing or shortness of breath.  Marland Kitchen aspirin EC 81 MG tablet Take 81 mg by mouth daily.  . Cholecalciferol (VITAMIN D-3) 1000 UNITS CAPS Take 1,000 Units by  mouth daily.   . fluticasone (FLOVENT HFA) 220 MCG/ACT inhaler Inhale 2 puffs into the lungs 2 (two) times daily.   Marland Kitchen glucose blood (ACCU-CHEK COMPACT PLUS) test strip Use as instructed  . HYDROcodone-acetaminophen (NORCO) 7.5-325 MG tablet Take 1 tablet by mouth every 6 (six) hours as needed for moderate pain.  Marland Kitchen insulin aspart (NOVOLOG FLEXPEN) 100 UNIT/ML FlexPen Inject 28-34 Units into the skin 3 (three) times daily with meals.  Marland Kitchen LANTUS SOLOSTAR 100 UNIT/ML Solostar Pen INJECT 80 UP TO 100 UNITS IN THE SKIN DAILY AT 10AM  . levothyroxine (SYNTHROID, LEVOTHROID) 75 MCG tablet Take 1 tablet (75 mcg total) by mouth daily before breakfast.  . lisinopril-hydrochlorothiazide (PRINZIDE,ZESTORETIC) 20-25 MG tablet TAKE  ONE TABLET BY MOUTH EVERY DAY.  . Melatonin 5 MG TABS Take 5 mg by mouth at bedtime.  . metFORMIN (GLUCOPHAGE-XR) 500 MG 24 hr tablet TAKE ONE TABLET (500MG  TOTAL) BY MOUTH TWO TIMES DAILY AFTER A MEAL  . pantoprazole (PROTONIX) 40 MG tablet TAKE ONE (1) TABLET EACH DAY  . Semaglutide (OZEMPIC) 0.25 or 0.5 MG/DOSE SOPN Inject 0.5 mg into the skin once a week.  . simvastatin (ZOCOR) 10 MG tablet TAKE ONE TABLET BY MOUTH EVERY NIGHT AT BEDTIME  . vitamin B-12 (CYANOCOBALAMIN) 500 MCG tablet Take 500 mcg by mouth daily.    BP (!) 159/79   Pulse 82   Ht 5\' 5"  (1.651 m)   Wt 242 lb (109.8 kg)   BMI 40.27 kg/m   Physical Exam  Constitutional: She is oriented to person, place, and time. She appears well-developed and well-nourished.  Neurological: She is alert and oriented to person, place, and time. Gait abnormal.  Psychiatric: She has a normal mood and affect. Judgment normal.  Vitals reviewed.   Ortho Exam  Right ring finger has multiple evidence of Heberden's and Bouchard's nodes.  She still has normal range of motion of the digits of the right hand with no instability good grip strength normal skin except for the nodule which is dry.  Pulse and perfusion normal extremity temperature normal no lymphadenopathy is seen normal sensation is noted in the extremity  Of note her left hand has a previous excision of a mass in the same area and she also has multiple nodes there as well range of motion strength is normal skin is intact  MEDICAL DECISION SECTION  Xrays were done at [facility]  My independent reading of xrays:  Our internal x-ray shows a marginal osteophyte at the DIP joint of the right ring finger  Encounter Diagnoses  Name Primary?  . Digital mucinous cyst of finger of right hand Yes  . Body mass index 40.0-44.9, adult ([place])   . Morbid obesity ([place])    The patient meets the AMA guidelines for Morbid (severe) obesity with a BMI > 40.0 and I have recommended  weight loss.  PLAN: (Rx., injectx, surgery, frx, mri/ct) Mass DIP joint right ring  Excision of mass DIP joint right ring finger 2-week postop follow-up  No orders of the defined types were placed in this encounter.   Arther Abbott, MD  04/02/2018 9:17 AM

## 2018-04-07 NOTE — Patient Instructions (Signed)
Sandy Valencia  04/07/2018     @PREFPERIOPPHARMACY @   Your procedure is scheduled on  04/17/2018 .  Report to [facility] at  1010   A.M.  Call this number if you have problems the morning of surgery:  [phone]   Remember:  Do not eat or drink after midnight.                        Take these medicines the morning of surgery with A SIP OF WATER  Hydrocodone ( if needed), levothyroxine, lisinopril, protonix.    Do not wear jewelry, make-up or nail polish.  Do not wear lotions, powders, or perfumes, or deodorant.  Do not shave 48 hours prior to surgery.  Men may shave face and neck.  Do not bring valuables to the hospital.  [facility] is not responsible for any belongings or valuables.  Contacts, dentures or bridgework may not be worn into surgery.  Leave your suitcase in the car.  After surgery it may be brought to your room.  For patients admitted to the hospital, discharge time will be determined by your treatment team.  Patients discharged the day of surgery will not be allowed to drive home.   Name and phone number of your driver:   family Special instructions:  None  Please read over the following fact sheets that you were given. Anesthesia Post-op Instructions and Care and Recovery After Surgery      Epidermal Cyst Removal Epidermal cyst removal is a procedure to remove a sac of oily material that forms under your skin (epidermal cyst). Epidermal cysts may also be called epidermoid cysts or keratin cysts. Normally, the skin secretes this oily material through a gland or a hair follicle. This type of cyst usually results when a skin gland or hair follicle becomes blocked. You may need this procedure if you have an epidermal cyst that becomes large, uncomfortable, or infected. Tell a health care provider about:  Any allergies you have.  All medicines you are taking, including vitamins, herbs, eye drops, creams, and over-the-counter  medicines.  Any problems you or family members have had with anesthetic medicines.  Any blood disorders you have.  Any surgeries you have had.  Any medical conditions you have. What are the risks? Generally, this is a safe procedure. However, problems may occur, including:  Developing another cyst.  Bleeding.  Infection.  Scarring.  What happens before the procedure?  Ask your health care provider about: ? Changing or stopping your regular medicines. This is especially important if you are taking diabetes medicines or blood thinners. ? Taking medicines such as aspirin and ibuprofen. These medicines can thin your blood. Do not take these medicines before your procedure if your health care provider instructs you not to.  If you have an infected cyst, you may have to take antibiotic medicines before or after the cyst removal. Take your antibiotics as directed by your health care provider. Finish all of the medicine even if you start to feel better.  Take a shower on the morning of your procedure. Your health care provider may ask you to use a germ-killing (antiseptic) soap. What happens during the procedure?  You will be given a medicine that numbs the area (local anesthetic).  The skin around the cyst will be cleaned with a germ-killing solution (antiseptic).  Your health care provider will make a small surgical incision over  the cyst.  The cyst will be separated from the surrounding tissues that are under your skin.  If possible, the cyst will be removed undamaged (intact).  If the cyst bursts (ruptures), it will need to be removed in pieces.  After the cyst is removed, your health care provider will control any bleeding and close the incision with small stitches (sutures). Small incisions may not need sutures, and the bleeding will be controlled by applying direct pressure with gauze.  Your health care provider may apply antibiotic ointment and a light bandage (dressing)  over the incision. This procedure may vary among health care providers and hospitals. What happens after the procedure?  If your cyst ruptured during surgery, you may need to take antibiotic medicine. If you were prescribed an antibiotic medicine, finish all of it even if you start to feel better. This information is not intended to replace advice given to you by your health care provider. Make sure you discuss any questions you have with your health care provider. Document Released: 05/18/2000 Document Revised: 10/27/2015 Document Reviewed: 02/03/2014 Elsevier Interactive Patient Education  2018 [place]. Epidermal Cyst Removal, Care After Refer to this sheet in the next few weeks. These instructions provide you with information about caring for yourself after your procedure. Your health care provider may also give you more specific instructions. Your treatment has been planned according to current medical practices, but problems sometimes occur. Call your health care provider if you have any problems or questions after your procedure. What can I expect after the procedure? After the procedure, it is common to have:  Soreness in the area where your cyst was removed.  Tightness or itching from your skin sutures.  Follow these instructions at home:  Take medicines only as directed by your health care provider.  If you were prescribed an antibiotic medicine, finish all of it even if you start to feel better.  Use antibiotic ointment as directed by your health care provider. Follow the instructions carefully.  There are many different ways to close and cover an incision, including stitches (sutures), skin glue, and adhesive strips. Follow your health care provider's instructions about: ? Incision care. ? Bandage (dressing) changes and removal. ? Incision closure removal.  Keep the bandage (dressing) dry until your health care provider says that it can be removed. Take sponge baths  only. Ask your health care provider when you can start showering or taking a bath.  After your dressing is off, check your incision every day for signs of infection. Watch for: ? Redness, swelling, or pain. ? Fluid, blood, or pus.  You can return to your normal activities. Do not do anything that stretches or puts pressure on your incision.  You can return to your normal diet.  Keep all follow-up visits as directed by your health care provider. This is important. Contact a health care provider if:  You have a fever.  Your incision bleeds.  You have redness, swelling, or pain in the incision area.  You have fluid, blood, or pus coming from your incision.  Your cyst comes back after surgery. This information is not intended to replace advice given to you by your health care provider. Make sure you discuss any questions you have with your health care provider. Document Released: 06/11/2014 Document Revised: 10/27/2015 Document Reviewed: 02/03/2014 Elsevier Interactive Patient Education  2018 [place] Anesthesia is a term that refers to techniques, procedures, and medicines that help a person stay safe  and comfortable during a medical procedure. Monitored anesthesia care, or sedation, is one type of anesthesia. Your anesthesia specialist may recommend sedation if you will be having a procedure that does not require you to be unconscious, such as:  Cataract surgery.  A dental procedure.  A biopsy.  A colonoscopy.  During the procedure, you may receive a medicine to help you relax (sedative). There are three levels of sedation:  Mild sedation. At this level, you may feel awake and relaxed. You will be able to follow directions.  Moderate sedation. At this level, you will be sleepy. You may not remember the procedure.  Deep sedation. At this level, you will be asleep. You will not remember the procedure.  The more medicine you are given, the  deeper your level of sedation will be. Depending on how you respond to the procedure, the anesthesia specialist may change your level of sedation or the type of anesthesia to fit your needs. An anesthesia specialist will monitor you closely during the procedure. Let your health care provider know about:  Any allergies you have.  All medicines you are taking, including vitamins, herbs, eye drops, creams, and over-the-counter medicines.  Any use of steroids (by mouth or as a cream).  Any problems you or family members have had with sedatives and anesthetic medicines.  Any blood disorders you have.  Any surgeries you have had.  Any medical conditions you have, such as sleep apnea.  Whether you are pregnant or may be pregnant.  Any use of cigarettes, alcohol, or street drugs. What are the risks? Generally, this is a safe procedure. However, problems may occur, including:  Getting too much medicine (oversedation).  Nausea.  Allergic reaction to medicines.  Trouble breathing. If this happens, a breathing tube may be used to help with breathing. It will be removed when you are awake and breathing on your own.  Heart trouble.  Lung trouble.  Before the procedure Staying hydrated Follow instructions from your health care provider about hydration, which may include:  Up to 2 hours before the procedure - you may continue to drink clear liquids, such as water, clear fruit juice, black coffee, and plain tea.  Eating and drinking restrictions Follow instructions from your health care provider about eating and drinking, which may include:  8 hours before the procedure - stop eating heavy meals or foods such as meat, fried foods, or fatty foods.  6 hours before the procedure - stop eating light meals or foods, such as toast or cereal.  6 hours before the procedure - stop drinking milk or drinks that contain milk.  2 hours before the procedure - stop drinking clear  liquids.  Medicines Ask your health care provider about:  Changing or stopping your regular medicines. This is especially important if you are taking diabetes medicines or blood thinners.  Taking medicines such as aspirin and ibuprofen. These medicines can thin your blood. Do not take these medicines before your procedure if your health care provider instructs you not to.  Tests and exams  You will have a physical exam.  You may have blood tests done to show: ? How well your kidneys and liver are working. ? How well your blood can clot.  General instructions  Plan to have someone take you home from the hospital or clinic.  If you will be going home right after the procedure, plan to have someone with you for 24 hours.  What happens during the procedure?  Your blood  pressure, heart rate, breathing, level of pain and overall condition will be monitored.  An IV tube will be inserted into one of your veins.  Your anesthesia specialist will give you medicines as needed to keep you comfortable during the procedure. This may mean changing the level of sedation.  The procedure will be performed. After the procedure  Your blood pressure, heart rate, breathing rate, and blood oxygen level will be monitored until the medicines you were given have worn off.  Do not drive for 24 hours if you received a sedative.  You may: ? Feel sleepy, clumsy, or nauseous. ? Feel forgetful about what happened after the procedure. ? Have a sore throat if you had a breathing tube during the procedure. ? Vomit. This information is not intended to replace advice given to you by your health care provider. Make sure you discuss any questions you have with your health care provider. Document Released: 02/14/2005 Document Revised: 10/28/2015 Document Reviewed: 09/11/2015 Elsevier Interactive Patient Education  2018 [place], Care After These instructions provide you with  information about caring for yourself after your procedure. Your health care provider may also give you more specific instructions. Your treatment has been planned according to current medical practices, but problems sometimes occur. Call your health care provider if you have any problems or questions after your procedure. What can I expect after the procedure? After your procedure, it is common to:  Feel sleepy for several hours.  Feel clumsy and have poor balance for several hours.  Feel forgetful about what happened after the procedure.  Have poor judgment for several hours.  Feel nauseous or vomit.  Have a sore throat if you had a breathing tube during the procedure.  Follow these instructions at home: For at least 24 hours after the procedure:   Do not: ? Participate in activities in which you could fall or become injured. ? Drive. ? Use heavy machinery. ? Drink alcohol. ? Take sleeping pills or medicines that cause drowsiness. ? Make important decisions or sign legal documents. ? Take care of children on your own.  Rest. Eating and drinking  Follow the diet that is recommended by your health care provider.  If you vomit, drink water, juice, or soup when you can drink without vomiting.  Make sure you have little or no nausea before eating solid foods. General instructions  Have a responsible adult stay with you until you are awake and alert.  Take over-the-counter and prescription medicines only as told by your health care provider.  If you smoke, do not smoke without supervision.  Keep all follow-up visits as told by your health care provider. This is important. Contact a health care provider if:  You keep feeling nauseous or you keep vomiting.  You feel light-headed.  You develop a rash.  You have a fever. Get help right away if:  You have trouble breathing. This information is not intended to replace advice given to you by your health care provider.  Make sure you discuss any questions you have with your health care provider. Document Released: 09/11/2015 Document Revised: 01/11/2016 Document Reviewed: 09/11/2015 Elsevier Interactive Patient Education  [employer].

## 2018-04-08 DIAGNOSIS — E119 Type 2 diabetes mellitus without complications: Secondary | ICD-10-CM | POA: Diagnosis not present

## 2018-04-08 DIAGNOSIS — J301 Allergic rhinitis due to pollen: Secondary | ICD-10-CM | POA: Diagnosis not present

## 2018-04-08 DIAGNOSIS — M545 Low back pain: Secondary | ICD-10-CM | POA: Diagnosis not present

## 2018-04-08 DIAGNOSIS — Z23 Encounter for immunization: Secondary | ICD-10-CM | POA: Diagnosis not present

## 2018-04-08 DIAGNOSIS — I10 Essential (primary) hypertension: Secondary | ICD-10-CM | POA: Diagnosis not present

## 2018-04-11 ENCOUNTER — Encounter (HOSPITAL_COMMUNITY): Payer: Self-pay

## 2018-04-11 ENCOUNTER — Encounter (HOSPITAL_COMMUNITY)
Admission: RE | Admit: 2018-04-11 | Discharge: 2018-04-11 | Disposition: A | Discharge status: Home / Self Care | Payer: Medicaid Other | Source: Ambulatory Visit | Attending: Orthopedic Surgery | Admitting: Orthopedic Surgery

## 2018-04-11 ENCOUNTER — Other Ambulatory Visit: Payer: Self-pay

## 2018-04-11 DIAGNOSIS — M67441 Ganglion, right hand: Secondary | ICD-10-CM | POA: Diagnosis not present

## 2018-04-11 DIAGNOSIS — Z01818 Encounter for other preprocedural examination: Secondary | ICD-10-CM | POA: Diagnosis not present

## 2018-04-11 LAB — BASIC METABOLIC PANEL
ANION GAP: 8 (ref 5–15)
BUN: 13 mg/dL (ref 6–20)
CALCIUM: 8.9 mg/dL (ref 8.9–10.3)
CO2: 24 mmol/L (ref 22–32)
Chloride: 104 mmol/L (ref 98–111)
Creatinine, Ser: 0.68 mg/dL (ref 0.44–1.00)
Glucose, Bld: 116 mg/dL — ABNORMAL HIGH (ref 70–99)
POTASSIUM: 3.5 mmol/L (ref 3.5–5.1)
Sodium: 136 mmol/L (ref 135–145)

## 2018-04-11 LAB — CBC WITH DIFFERENTIAL/PLATELET
ABS IMMATURE GRANULOCYTES: 0.01 10*3/uL (ref 0.00–0.07)
Basophils Absolute: 0 10*3/uL (ref 0.0–0.1)
Basophils Relative: 0 %
EOS ABS: 0.1 10*3/uL (ref 0.0–0.5)
Eosinophils Relative: 2 %
HCT: 44.2 % (ref 36.0–46.0)
Hemoglobin: 13.5 g/dL (ref 12.0–15.0)
IMMATURE GRANULOCYTES: 0 %
Lymphocytes Relative: 26 %
Lymphs Abs: 1.3 10*3/uL (ref 0.7–4.0)
MCH: 27.6 pg (ref 26.0–34.0)
MCHC: 30.5 g/dL (ref 30.0–36.0)
MCV: 90.2 fL (ref 80.0–100.0)
MONOS PCT: 9 %
Monocytes Absolute: 0.4 10*3/uL (ref 0.1–1.0)
NEUTROS ABS: 3.2 10*3/uL (ref 1.7–7.7)
NEUTROS PCT: 63 %
Platelets: 231 10*3/uL (ref 150–400)
RBC: 4.9 MIL/uL (ref 3.87–5.11)
RDW: 14.1 % (ref 11.5–15.5)
WBC: 5.1 10*3/uL (ref 4.0–10.5)
nRBC: 0 % (ref 0.0–0.2)

## 2018-04-11 LAB — HEMOGLOBIN A1C
Hgb A1c MFr Bld: 8.2 % — ABNORMAL HIGH (ref 4.8–5.6)
Mean Plasma Glucose: 188.64 mg/dL

## 2018-04-11 LAB — GLUCOSE, CAPILLARY: Glucose-Capillary: 111 mg/dL — ABNORMAL HIGH (ref 70–99)

## 2018-04-11 NOTE — Pre-Procedure Instructions (Signed)
HgbA1C routed to Dr Harrison and PCP. 

## 2018-04-14 ENCOUNTER — Other Ambulatory Visit: Payer: Self-pay | Admitting: "Endocrinology

## 2018-04-14 DIAGNOSIS — L03032 Cellulitis of left toe: Secondary | ICD-10-CM | POA: Diagnosis not present

## 2018-04-14 DIAGNOSIS — L03031 Cellulitis of right toe: Secondary | ICD-10-CM | POA: Diagnosis not present

## 2018-04-14 DIAGNOSIS — B351 Tinea unguium: Secondary | ICD-10-CM | POA: Diagnosis not present

## 2018-04-15 NOTE — H&P (Signed)
History&  physical       Chief Complaint  Patient presents with  . Hand Pain      right ring finger      57 year old female with diabetes sleep apnea presents with arthritic nodule right ring finger complains of her it pain over that area of the DIP joint of the right ring finger is been there for several months if not years but is become painful ; does not affect her range of motion.  It does not affect her function.     Review of Systems  Constitutional: Negative for chills, fever and weight loss.  Respiratory: Positive for shortness of breath.   Cardiovascular: Negative for chest pain.  Neurological: Negative for tingling.        Past Medical History:  Diagnosis Date  . Angio-edema    . Arthritis    . Asthma    . Diabetes mellitus    . Fatty liver    . GERD (gastroesophageal reflux disease)    . Hypercholesteremia    . Hypertension    . Hypothyroidism    . Sleep apnea      pt siad, "i had small amount but could not tolerate CPAP and they said it was ok since it was mild.           Past Surgical History:  Procedure Laterality Date  . ABDOMINAL HYSTERECTOMY      . CHOLECYSTECTOMY      . COLONOSCOPY N/A 09/22/2012    RMR: colonic polyps -removed as described above. tubular adenoma, next TCS 09/2017  . COLONOSCOPY WITH PROPOFOL N/A 09/23/2017    Procedure: COLONOSCOPY WITH PROPOFOL;  Surgeon: Daneil Dolin, MD;  Location: [facility];  Service: Endoscopy;  Laterality: N/A;  7:30am  . CYST EXCISION Left 07/19/2016    Procedure: CYST REMOVAL LEFT RING FINGER;  Surgeon: Carole Civil, MD;  Location: [facility] OR[facility];  Service: Orthopedics;  Laterality: Left;  . CYST REMOVAL LEG Right      foot  . ESOPHAGOGASTRODUODENOSCOPY N/A 10/21/2014    RMR: Small hiatal hernia; otherwise normal EGD status post passage of a Maloney dilator.   Marland Kitchen MALONEY DILATION N/A 10/21/2014    Procedure: [name] DILATION;  Surgeon: Daneil Dolin, MD;  Location: [facility];  Service: Endoscopy;   Laterality: N/A;  . MIDDLE EAR SURGERY Right      patched hole in ear drum  . POLYPECTOMY   09/23/2017    Procedure: POLYPECTOMY;  Surgeon: Daneil Dolin, MD;  Location: [facility];  Service: Endoscopy;;  polyp hepatic flexure polyp cs, splenic flexure polyp cs, rectal polyp cs           Family History  Problem Relation Age of Onset  . Colon cancer Mother          diagnosed age 4, underwent surgical resection, metastatic disease several years later  . Diabetes Mother    . Heart attack Father    . Diabetes Sister    . Hypothyroidism Brother    . Diabetes Sister    . Diabetes Sister    . Diabetes Sister    . Hypothyroidism Sister      Social History         Tobacco Use  . Smoking status: Never Smoker  . Smokeless tobacco: Never Used  Substance Use Topics  . Alcohol use: No      Alcohol/week: 0.0 standard drinks  . Drug use: No  Allergies  Allergen Reactions  . Peanut-Containing Drug Products Anaphylaxis and Swelling      Active Medications      Current Meds  Medication Sig  . albuterol (PROVENTIL HFA) 108 (90 Base) MCG/ACT inhaler Inhale 2 puffs into the lungs every 6 (six) hours as needed for wheezing or shortness of breath.  Marland Kitchen aspirin EC 81 MG tablet Take 81 mg by mouth daily.  . Cholecalciferol (VITAMIN D-3) 1000 UNITS CAPS Take 1,000 Units by mouth daily.   . fluticasone (FLOVENT HFA) 220 MCG/ACT inhaler Inhale 2 puffs into the lungs 2 (two) times daily.   Marland Kitchen glucose blood (ACCU-CHEK COMPACT PLUS) test strip Use as instructed  . HYDROcodone-acetaminophen (NORCO) 7.5-325 MG tablet Take 1 tablet by mouth every 6 (six) hours as needed for moderate pain.  Marland Kitchen insulin aspart (NOVOLOG FLEXPEN) 100 UNIT/ML FlexPen Inject 28-34 Units into the skin 3 (three) times daily with meals.  Marland Kitchen LANTUS SOLOSTAR 100 UNIT/ML Solostar Pen INJECT 80 UP TO 100 UNITS IN THE SKIN DAILY AT 10AM  . levothyroxine (SYNTHROID, LEVOTHROID) 75 MCG tablet Take 1 tablet (75 mcg total) by  mouth daily before breakfast.  . lisinopril-hydrochlorothiazide (PRINZIDE,ZESTORETIC) 20-25 MG tablet TAKE ONE TABLET BY MOUTH EVERY DAY.  . Melatonin 5 MG TABS Take 5 mg by mouth at bedtime.  . metFORMIN (GLUCOPHAGE-XR) 500 MG 24 hr tablet TAKE ONE TABLET (500MG  TOTAL) BY MOUTH TWO TIMES DAILY AFTER A MEAL  . pantoprazole (PROTONIX) 40 MG tablet TAKE ONE (1) TABLET EACH DAY  . Semaglutide (OZEMPIC) 0.25 or 0.5 MG/DOSE SOPN Inject 0.5 mg into the skin once a week.  . simvastatin (ZOCOR) 10 MG tablet TAKE ONE TABLET BY MOUTH EVERY NIGHT AT BEDTIME  . vitamin B-12 (CYANOCOBALAMIN) 500 MCG tablet Take 500 mcg by mouth daily.        BP (!) 159/79   Pulse 82   Ht 5\' 5"  (1.651 m)   Wt 242 lb (109.8 kg)   BMI 40.27 kg/m    Physical Exam  Constitutional: She is oriented to person, place, and time. She appears well-developed and well-nourished.  Neurological: She is alert and oriented to person, place, and time. Gait abnormal.  Psychiatric: She has a normal mood and affect. Judgment normal.  Vitals reviewed.     Ortho Exam   Right ring finger has multiple evidence of Heberden's and Bouchard's nodes.  She still has normal range of motion of the digits of the right hand with no instability good grip strength normal skin except for the nodule which is dry.  Pulse and perfusion normal extremity temperature normal no lymphadenopathy is seen normal sensation is noted in the extremity   Of note her left hand has a previous excision of a mass in the same area and she also has multiple nodes there as well range of motion strength is normal skin is intact   MEDICAL DECISION SECTION  Xrays were done at [facility] orthopedics   My independent reading of xrays:  Our internal x-ray shows a marginal osteophyte at the DIP joint of the right ring finger       Encounter Diagnoses  Name Primary?  . Digital mucinous cyst of finger of right hand Yes  . Body mass index 40.0-44.9, adult ([place])    . Morbid  obesity ([place])      The patient meets the AMA guidelines for Morbid (severe) obesity with a BMI > 40.0 and I have recommended weight loss.   PLAN: (Rx., injectx, surgery, frx,  mri/ct) Mass DIP joint right ring   Excision of mass DIP joint right ring finger 2-week postop follow-up   No orders of the defined types were placed in this encounter.     Arther Abbott, MD   04/02/2018

## 2018-04-17 ENCOUNTER — Encounter (HOSPITAL_COMMUNITY)
Admission: RE | Disposition: A | Discharge status: Home / Self Care | Payer: Self-pay | Source: Ambulatory Visit | Attending: Orthopedic Surgery

## 2018-04-17 ENCOUNTER — Ambulatory Visit (HOSPITAL_COMMUNITY): Payer: Medicaid Other | Admitting: Anesthesiology

## 2018-04-17 ENCOUNTER — Other Ambulatory Visit: Payer: Self-pay

## 2018-04-17 ENCOUNTER — Ambulatory Visit (HOSPITAL_COMMUNITY)
Admission: RE | Admit: 2018-04-17 | Discharge: 2018-04-17 | Disposition: A | Discharge status: Home / Self Care | Payer: Medicaid Other | Source: Ambulatory Visit | Attending: Orthopedic Surgery | Admitting: Orthopedic Surgery

## 2018-04-17 ENCOUNTER — Encounter (HOSPITAL_COMMUNITY): Payer: Self-pay | Admitting: *Deleted

## 2018-04-17 DIAGNOSIS — Z79899 Other long term (current) drug therapy: Secondary | ICD-10-CM | POA: Insufficient documentation

## 2018-04-17 DIAGNOSIS — E7800 Pure hypercholesterolemia, unspecified: Secondary | ICD-10-CM | POA: Diagnosis not present

## 2018-04-17 DIAGNOSIS — M67441 Ganglion, right hand: Secondary | ICD-10-CM | POA: Diagnosis not present

## 2018-04-17 DIAGNOSIS — Z7982 Long term (current) use of aspirin: Secondary | ICD-10-CM | POA: Insufficient documentation

## 2018-04-17 DIAGNOSIS — G4730 Sleep apnea, unspecified: Secondary | ICD-10-CM | POA: Diagnosis not present

## 2018-04-17 DIAGNOSIS — I10 Essential (primary) hypertension: Secondary | ICD-10-CM | POA: Insufficient documentation

## 2018-04-17 DIAGNOSIS — J45909 Unspecified asthma, uncomplicated: Secondary | ICD-10-CM | POA: Insufficient documentation

## 2018-04-17 DIAGNOSIS — M1990 Unspecified osteoarthritis, unspecified site: Secondary | ICD-10-CM | POA: Insufficient documentation

## 2018-04-17 DIAGNOSIS — K219 Gastro-esophageal reflux disease without esophagitis: Secondary | ICD-10-CM | POA: Diagnosis not present

## 2018-04-17 DIAGNOSIS — L728 Other follicular cysts of the skin and subcutaneous tissue: Secondary | ICD-10-CM | POA: Insufficient documentation

## 2018-04-17 DIAGNOSIS — Z794 Long term (current) use of insulin: Secondary | ICD-10-CM | POA: Diagnosis not present

## 2018-04-17 DIAGNOSIS — E119 Type 2 diabetes mellitus without complications: Secondary | ICD-10-CM | POA: Diagnosis not present

## 2018-04-17 DIAGNOSIS — L729 Follicular cyst of the skin and subcutaneous tissue, unspecified: Secondary | ICD-10-CM | POA: Diagnosis not present

## 2018-04-17 HISTORY — PX: EXCISION MASS UPPER EXTREMETIES: SHX6704

## 2018-04-17 LAB — GLUCOSE, CAPILLARY
GLUCOSE-CAPILLARY: 77 mg/dL (ref 70–99)
Glucose-Capillary: 100 mg/dL — ABNORMAL HIGH (ref 70–99)

## 2018-04-17 SURGERY — EXCISION MASS UPPER EXTREMITIES
Anesthesia: Regional | Site: Hand | Laterality: Right

## 2018-04-17 MED ORDER — HYDROCODONE-ACETAMINOPHEN 7.5-325 MG PO TABS: 1.0000 | ORAL_TABLET | Freq: Once | ORAL | Status: DC | PRN

## 2018-04-17 MED ORDER — LACTATED RINGERS IV SOLN
INTRAVENOUS | Status: DC
  Administered 2018-04-17: 11:00:00 via INTRAVENOUS

## 2018-04-17 MED ORDER — PROPOFOL 500 MG/50ML IV EMUL
INTRAVENOUS | Status: DC | PRN
  Administered 2018-04-17: 100 ug/kg/min via INTRAVENOUS
  Administered 2018-04-17: 13:00:00 via INTRAVENOUS

## 2018-04-17 MED ORDER — MIDAZOLAM HCL 2 MG/2ML IJ SOLN
INTRAMUSCULAR | Status: AC
  Filled 2018-04-17: qty 2

## 2018-04-17 MED ORDER — LACTATED RINGERS IV SOLN: INTRAVENOUS | Status: DC

## 2018-04-17 MED ORDER — MEPERIDINE HCL 50 MG/ML IJ SOLN: 6.2500 mg | INTRAMUSCULAR | Status: DC | PRN

## 2018-04-17 MED ORDER — 0.9 % SODIUM CHLORIDE (POUR BTL) OPTIME
TOPICAL | Status: DC | PRN
  Administered 2018-04-17: 1000 mL

## 2018-04-17 MED ORDER — BUPIVACAINE HCL (PF) 0.5 % IJ SOLN
INTRAMUSCULAR | Status: DC | PRN
  Administered 2018-04-17: 8 mL

## 2018-04-17 MED ORDER — FENTANYL CITRATE (PF) 100 MCG/2ML IJ SOLN
INTRAMUSCULAR | Status: DC | PRN
  Administered 2018-04-17: 50 ug via INTRAVENOUS

## 2018-04-17 MED ORDER — FENTANYL CITRATE (PF) 100 MCG/2ML IJ SOLN
INTRAMUSCULAR | Status: AC
  Filled 2018-04-17: qty 2

## 2018-04-17 MED ORDER — PROMETHAZINE HCL 25 MG/ML IJ SOLN: 6.2500 mg | INTRAMUSCULAR | Status: DC | PRN

## 2018-04-17 MED ORDER — BUPIVACAINE HCL (PF) 0.5 % IJ SOLN
INTRAMUSCULAR | Status: AC
  Filled 2018-04-17: qty 30

## 2018-04-17 MED ORDER — MIDAZOLAM HCL 5 MG/5ML IJ SOLN
INTRAMUSCULAR | Status: DC | PRN
  Administered 2018-04-17: 2 mg via INTRAVENOUS

## 2018-04-17 MED ORDER — HYDROMORPHONE HCL 1 MG/ML IJ SOLN: 0.2500 mg | INTRAMUSCULAR | Status: DC | PRN

## 2018-04-17 MED ORDER — CHLORHEXIDINE GLUCONATE 4 % EX LIQD: 60.0000 mL | Freq: Once | CUTANEOUS | Status: DC

## 2018-04-17 MED ORDER — SODIUM CHLORIDE 0.9% FLUSH
INTRAVENOUS | Status: AC
  Filled 2018-04-17: qty 10

## 2018-04-17 MED ORDER — LIDOCAINE HCL (PF) 0.5 % IJ SOLN
INTRAMUSCULAR | Status: DC | PRN
  Administered 2018-04-17: 45 mg via INTRAVENOUS

## 2018-04-17 MED ORDER — CEFAZOLIN SODIUM-DEXTROSE 2-4 GM/100ML-% IV SOLN
2.0000 g | INTRAVENOUS | Status: AC
  Administered 2018-04-17: 2 g via INTRAVENOUS
  Filled 2018-04-17: qty 100

## 2018-04-17 SURGICAL SUPPLY — 45 items
BANDAGE ELASTIC 2 LF NS (GAUZE/BANDAGES/DRESSINGS) ×3 IMPLANT
BANDAGE ELASTIC 3 VELCRO ST LF (GAUZE/BANDAGES/DRESSINGS) IMPLANT
BANDAGE ELASTIC 4 LF NS (GAUZE/BANDAGES/DRESSINGS) ×3 IMPLANT
BANDAGE ESMARK 4X12 BL STRL LF (DISPOSABLE) ×1 IMPLANT
BLADE SURG 15 STRL LF DISP TIS (BLADE) IMPLANT
BLADE SURG 15 STRL SS (BLADE)
BLADE SURG SZ10 CARB STEEL (BLADE) IMPLANT
BNDG COHESIVE 4X5 TAN STRL (GAUZE/BANDAGES/DRESSINGS) ×3 IMPLANT
BNDG CONFORM 2 STRL LF (GAUZE/BANDAGES/DRESSINGS) ×3 IMPLANT
BNDG ESMARK 4X12 BLUE STRL LF (DISPOSABLE) ×3
BNDG GAUZE ELAST 4 BULKY (GAUZE/BANDAGES/DRESSINGS) ×3 IMPLANT
CHLORAPREP W/TINT 26ML (MISCELLANEOUS) ×3 IMPLANT
CLOSURE WOUND 1/2 X4 (GAUZE/BANDAGES/DRESSINGS) ×1
CLOTH BEACON ORANGE TIMEOUT ST (SAFETY) ×3 IMPLANT
COVER LIGHT HANDLE STERIS (MISCELLANEOUS) ×12 IMPLANT
CUFF TOURNIQUET SINGLE 18IN (TOURNIQUET CUFF) ×3 IMPLANT
DECANTER SPIKE VIAL GLASS SM (MISCELLANEOUS) ×3 IMPLANT
DRSG XEROFORM 1X8 (GAUZE/BANDAGES/DRESSINGS) ×3 IMPLANT
ELECT NEEDLE TIP 2.8 STRL (NEEDLE) ×3 IMPLANT
ELECT REM PT RETURN 9FT ADLT (ELECTROSURGICAL) ×3
ELECTRODE REM PT RTRN 9FT ADLT (ELECTROSURGICAL) ×1 IMPLANT
FORMALIN 10 PREFIL 120ML (MISCELLANEOUS) ×3 IMPLANT
GAUZE SPONGE 4X4 12PLY STRL (GAUZE/BANDAGES/DRESSINGS) ×3 IMPLANT
GLOVE BIOGEL PI IND STRL 7.0 (GLOVE) ×2 IMPLANT
GLOVE BIOGEL PI INDICATOR 7.0 (GLOVE) ×4
GLOVE ECLIPSE 6.5 STRL STRAW (GLOVE) ×3 IMPLANT
GLOVE SKINSENSE NS SZ8.0 LF (GLOVE) ×2
GLOVE SKINSENSE STRL SZ8.0 LF (GLOVE) ×1 IMPLANT
GLOVE SS N UNI LF 8.5 STRL (GLOVE) ×3 IMPLANT
GOWN STRL REUS W/TWL LRG LVL3 (GOWN DISPOSABLE) ×9 IMPLANT
GOWN STRL REUS W/TWL XL LVL3 (GOWN DISPOSABLE) ×3 IMPLANT
HAND ALUMI XLG (SOFTGOODS) ×3 IMPLANT
KIT TURNOVER KIT A (KITS) ×3 IMPLANT
MANIFOLD NEPTUNE II (INSTRUMENTS) ×3 IMPLANT
NEEDLE HYPO 21X1.5 SAFETY (NEEDLE) ×3 IMPLANT
NS IRRIG 1000ML POUR BTL (IV SOLUTION) ×3 IMPLANT
PACK BASIC LIMB (CUSTOM PROCEDURE TRAY) ×3 IMPLANT
PAD ARMBOARD 7.5X6 YLW CONV (MISCELLANEOUS) ×3 IMPLANT
SET BASIN LINEN APH (SET/KITS/TRAYS/PACK) ×3 IMPLANT
STRIP CLOSURE SKIN 1/2X4 (GAUZE/BANDAGES/DRESSINGS) ×2 IMPLANT
SUT ETHILON 3 0 FSL (SUTURE) ×3 IMPLANT
SUT MON AB 2-0 SH 27 (SUTURE) ×2
SUT MON AB 2-0 SH27 (SUTURE) ×1 IMPLANT
SUT PROLENE 3 0 PS 2 (SUTURE) IMPLANT
SYR 10ML LL (SYRINGE) ×3 IMPLANT

## 2018-04-17 NOTE — Anesthesia Procedure Notes (Signed)
Procedure Name: MAC Performed by: ,  A, CRNA Pre-anesthesia Checklist: Patient identified, Emergency Drugs available, Suction available, Timeout performed and Patient being monitored Patient Re-evaluated:Patient Re-evaluated prior to induction Oxygen Delivery Method: Non-rebreather mask       

## 2018-04-17 NOTE — Anesthesia Procedure Notes (Signed)
Anesthesia Regional Block: Bier block (IV Regional)   Pre-Anesthetic Checklist: ,, timeout performed, Correct Patient, Correct Site, Correct Laterality, Correct Procedure,, site marked, surgical consent,, at surgeon's request  Laterality: Right     Needles:  Injection technique: Single-shot  Needle Type: Other      Needle Gauge: 22     Additional Needles:   Procedures:,,,,, intact distal pulses, Esmarch exsanguination, single tourniquet utilized,   Nerve Stimulator or Paresthesia:   Additional Responses:  Pulse checked post tourniquet inflation. IV NSL discontinued post injection. Narrative:  Start time: 04/17/2018 12:15 PM  Performed by: Personally

## 2018-04-17 NOTE — Brief Op Note (Signed)
04/17/2018  12:44 PM  PATIENT:  Sandy Valencia  57 y.o. female  PRE-OPERATIVE DIAGNOSIS:  mucous cyst right ring finger  POST-OPERATIVE DIAGNOSIS:  mucous cyst right ring finger  PROCEDURE:  Procedure(s) with comments: EXCISION MASS UPPER EXTREMETIES right ring finger (Right) -mucous cyst right ring finger  SURGEON:  Surgeon(s) and Role:    Carole Civil, MD - Primary  Operative findings small 2 to 3 x 3 mm mass which was involving the entire dermal layer down to the extensor tendon.   PHYSICIAN ASSISTANT:   ASSISTANTS: none   ANESTHESIA:   regional  EBL:  none   BLOOD ADMINISTERED:none  DRAINS: none   LOCAL MEDICATIONS USED:  MARCAINE     SPECIMEN:  Source of Specimen:  right ring finger   DISPOSITION OF SPECIMEN:  PATHOLOGY  COUNTS:  YES  TOURNIQUET:   Total Tourniquet Time Documented: Upper Arm (Right) - 28 minutes Total: Upper Arm (Right) - 28 minutes   DICTATION: .[name] Dictation  PLAN OF CARE: Discharge to home after PACU  PATIENT DISPOSITION:  PACU - hemodynamically stable.   Delay start of Pharmacological VTE agent (>24hrs) due to surgical blood loss or risk of bleeding: not applicable

## 2018-04-17 NOTE — Interval H&P Note (Signed)
History and Physical Interval Note:  04/17/2018 11:51 AM  Omni W Debose  has presented today for surgery, with the diagnosis of mucous cyst right ring finger  The various methods of treatment have been discussed with the patient and family. After consideration of risks, benefits and other options for treatment, the patient has consented to  Procedure(s) with comments: EXCISION MASS UPPER EXTREMETIES right ring finger (Right) - pt knows to arrive at 9:30 as a surgical intervention .  The patient's history has been reviewed, patient examined, no change in status, stable for surgery.  I have reviewed the patient's chart and labs.  Questions were answered to the patient's satisfaction.     Arther Abbott

## 2018-04-17 NOTE — Anesthesia Postprocedure Evaluation (Signed)
Anesthesia Post Note  Patient: Sandy Valencia  Procedure(s) Performed: EXCISION MASS UPPER EXTREMETIES right ring finger (Right Hand)  Patient location during evaluation: PACU Anesthesia Type: Bier Block Level of consciousness: awake and alert and oriented Pain management: pain level controlled Vital Signs Assessment: post-procedure vital signs reviewed and stable Respiratory status: spontaneous breathing Cardiovascular status: stable Postop Assessment: no apparent nausea or vomiting Anesthetic complications: no     Last Vitals:  Vitals:   04/17/18 1035  BP: (!) 152/86  Pulse: 73  Resp: 20  Temp: 37 C  SpO2: 96%    Last Pain:  Vitals:   04/17/18 1035  TempSrc: Oral  PainSc: 0-No pain                 ADAMS, AMY A

## 2018-04-17 NOTE — Transfer of Care (Signed)
Immediate Anesthesia Transfer of Care Note  Patient: Sandy Valencia  Procedure(s) Performed: EXCISION MASS UPPER EXTREMETIES right ring finger (Right Hand)  Patient Location: PACU  Anesthesia Type:MAC and Bier block  Level of Consciousness: awake, alert , oriented and patient cooperative  Airway & Oxygen Therapy: Patient Spontanous Breathing  Post-op Assessment: Report given to RN and Post -op Vital signs reviewed and stable  Post vital signs: Reviewed and stable  Last Vitals:  Vitals Value Taken Time  BP    Temp    Pulse 73 04/17/2018 12:51 PM  Resp 17 04/17/2018 12:51 PM  SpO2 91 % 04/17/2018 12:51 PM  Vitals shown include unvalidated device data.  Last Pain:  Vitals:   04/17/18 1035  TempSrc: Oral  PainSc: 0-No pain      Patients Stated Pain Goal: 6 (37/48/27 0786)  Complications: No apparent anesthesia complications

## 2018-04-17 NOTE — Op Note (Addendum)
04/17/2018  12:44 PM  PATIENT:  Sandy Valencia  57 y.o. female  PRE-OPERATIVE DIAGNOSIS:  mucous cyst right ring finger  POST-OPERATIVE DIAGNOSIS:  mucous cyst right ring finger  PROCEDURE:  Procedure(s) with comments: EXCISION MASS UPPER EXTREMETIES right ring finger (Right) -mucous cyst right ring finger 26160:  Excision of lesion of tendon sheath or joint capsule mucous cyst hand or finger   SURGEON:  Surgeon(s) and Role:    Carole Civil, MD - Primary  Operative findings small 2 to 3 x 3 mm mass which was involving the entire dermal layer down to the extensor tendon.  The patient was identified in preop surgical site confirmed marked his right ring finger over the mass dorsum of the finger DIP joint.  Chart review completed.  Patient taken to surgery.  Regional block was given.  Timeout was taken after sterile prep and drape.  The incision was made transverse with elliptical margins around the mass it was taken full-thickness dermal layer down to the extensor tendon.  I undermined the skin to try to get it to come together I even made a relaxing incision longitudinally which did help bring portion of the incision together but I portion of the incision had to be left open.  This will be allowed to granulate in with dressing changes.  3-0 nylon suture was used to close the wound and 4 cc of Marcaine was used to do a digital block with 4 cc used on each side for total of 8  Sterile dressing applied tourniquet release viability was normal in each digit  Patient taken recovery room stable condition   PHYSICIAN ASSISTANT:   ASSISTANTS: none   ANESTHESIA:   regional  EBL:  none   BLOOD ADMINISTERED:none  DRAINS: none   LOCAL MEDICATIONS USED:  MARCAINE     SPECIMEN:  Source of Specimen:  right ring finger   DISPOSITION OF SPECIMEN:  PATHOLOGY  COUNTS:  YES  TOURNIQUET:   Total Tourniquet Time Documented: Upper Arm (Right) - 28 minutes Total: Upper Arm  (Right) - 28 minutes   DICTATION: .[name] Dictation  PLAN OF CARE: Discharge to home after PACU  PATIENT DISPOSITION:  PACU - hemodynamically stable.   Delay start of Pharmacological VTE agent (>24hrs) due to surgical blood loss or risk of bleeding: not applicable

## 2018-04-17 NOTE — Anesthesia Preprocedure Evaluation (Signed)
Anesthesia Evaluation    Airway Mallampati: II       Dental   Pulmonary asthma , sleep apnea ,     + decreased breath sounds      Cardiovascular hypertension, On Medications  Rhythm:regular     Neuro/Psych    GI/Hepatic hiatal hernia, GERD  ,  Endo/Other  diabetes, Type 2Hypothyroidism Morbid obesity  Renal/GU      Musculoskeletal   Abdominal   Peds  Hematology  (+) Blood dyscrasia, anemia ,   Anesthesia Other Findings Morbid obesity  Reproductive/Obstetrics                             Anesthesia Physical Anesthesia Plan  ASA: III  Anesthesia Plan: Bier Block and Bier Block-Lidocaine Only   Post-op Pain Management:    Induction:   PONV Risk Score and Plan:   Airway Management Planned:   Additional Equipment:   Intra-op Plan:   Post-operative Plan:   Informed Consent:   Plan Discussed with: Anesthesiologist  Anesthesia Plan Comments:         Anesthesia Quick Evaluation

## 2018-04-17 NOTE — Discharge Instructions (Signed)
I changed your appointment to next Wednesday to change the dressing  I remove the mass but the skin could not be completely closed.  I will show you how to take care of the wound on Wednesday when we change the dressing  Monitored Anesthesia Care, Care After These instructions provide you with information about caring for yourself after your procedure. Your health care provider may also give you more specific instructions. Your treatment has been planned according to current medical practices, but problems sometimes occur. Call your health care provider if you have any problems or questions after your procedure. What can I expect after the procedure? After your procedure, it is common to:  Feel sleepy for several hours.  Feel clumsy and have poor balance for several hours.  Feel forgetful about what happened after the procedure.  Have poor judgment for several hours.  Feel nauseous or vomit.  Have a sore throat if you had a breathing tube during the procedure.  Follow these instructions at home: For at least 24 hours after the procedure:   Do not: ? Participate in activities in which you could fall or become injured. ? Drive. ? Use heavy machinery. ? Drink alcohol. ? Take sleeping pills or medicines that cause drowsiness. ? Make important decisions or sign legal documents. ? Take care of children on your own.  Rest. Eating and drinking  Follow the diet that is recommended by your health care provider.  If you vomit, drink water, juice, or soup when you can drink without vomiting.  Make sure you have little or no nausea before eating solid foods. General instructions  Have a responsible adult stay with you until you are awake and alert.  Take over-the-counter and prescription medicines only as told by your health care provider.  If you smoke, do not smoke without supervision.  Keep all follow-up visits as told by your health care provider. This is important. Contact  a health care provider if:  You keep feeling nauseous or you keep vomiting.  You feel light-headed.  You develop a rash.  You have a fever. Get help right away if:  You have trouble breathing. This information is not intended to replace advice given to you by your health care provider. Make sure you discuss any questions you have with your health care provider. Document Released: 09/11/2015 Document Revised: 01/11/2016 Document Reviewed: 09/11/2015 Elsevier Interactive Patient Education  [employer].

## 2018-04-18 ENCOUNTER — Encounter (HOSPITAL_COMMUNITY): Payer: Self-pay | Admitting: Orthopedic Surgery

## 2018-04-22 ENCOUNTER — Emergency Department (HOSPITAL_COMMUNITY): Payer: Medicaid Other

## 2018-04-22 ENCOUNTER — Ambulatory Visit (INDEPENDENT_AMBULATORY_CARE_PROVIDER_SITE_OTHER): Payer: Medicaid Other | Admitting: Allergy & Immunology

## 2018-04-22 ENCOUNTER — Encounter: Payer: Self-pay | Admitting: Allergy & Immunology

## 2018-04-22 ENCOUNTER — Encounter (HOSPITAL_COMMUNITY): Payer: Self-pay | Admitting: Emergency Medicine

## 2018-04-22 ENCOUNTER — Emergency Department (HOSPITAL_COMMUNITY)
Admission: EM | Admit: 2018-04-22 | Discharge: 2018-04-23 | Disposition: A | Discharge status: Home / Self Care | Payer: Medicaid Other | Attending: Emergency Medicine | Admitting: Emergency Medicine

## 2018-04-22 ENCOUNTER — Other Ambulatory Visit: Payer: Self-pay

## 2018-04-22 VITALS — BP 130/80 | HR 106 | Resp 18 | Ht 64.2 in | Wt 240.2 lb

## 2018-04-22 DIAGNOSIS — R1030 Lower abdominal pain, unspecified: Secondary | ICD-10-CM | POA: Diagnosis not present

## 2018-04-22 DIAGNOSIS — Z7982 Long term (current) use of aspirin: Secondary | ICD-10-CM | POA: Diagnosis not present

## 2018-04-22 DIAGNOSIS — I10 Essential (primary) hypertension: Secondary | ICD-10-CM | POA: Insufficient documentation

## 2018-04-22 DIAGNOSIS — T7800XD Anaphylactic reaction due to unspecified food, subsequent encounter: Secondary | ICD-10-CM | POA: Diagnosis not present

## 2018-04-22 DIAGNOSIS — Z794 Long term (current) use of insulin: Secondary | ICD-10-CM | POA: Diagnosis not present

## 2018-04-22 DIAGNOSIS — E039 Hypothyroidism, unspecified: Secondary | ICD-10-CM | POA: Insufficient documentation

## 2018-04-22 DIAGNOSIS — Z79899 Other long term (current) drug therapy: Secondary | ICD-10-CM | POA: Diagnosis not present

## 2018-04-22 DIAGNOSIS — R1032 Left lower quadrant pain: Secondary | ICD-10-CM | POA: Diagnosis not present

## 2018-04-22 DIAGNOSIS — R2231 Localized swelling, mass and lump, right upper limb: Secondary | ICD-10-CM | POA: Insufficient documentation

## 2018-04-22 DIAGNOSIS — R197 Diarrhea, unspecified: Secondary | ICD-10-CM | POA: Diagnosis not present

## 2018-04-22 DIAGNOSIS — R1110 Vomiting, unspecified: Secondary | ICD-10-CM | POA: Diagnosis not present

## 2018-04-22 DIAGNOSIS — J45909 Unspecified asthma, uncomplicated: Secondary | ICD-10-CM | POA: Insufficient documentation

## 2018-04-22 DIAGNOSIS — E119 Type 2 diabetes mellitus without complications: Secondary | ICD-10-CM | POA: Insufficient documentation

## 2018-04-22 HISTORY — DX: Localized swelling, mass and lump, right upper limb: R22.31

## 2018-04-22 LAB — LIPASE, BLOOD: Lipase: 26 U/L (ref 11–51)

## 2018-04-22 LAB — URINALYSIS, ROUTINE W REFLEX MICROSCOPIC
Bilirubin Urine: NEGATIVE
Glucose, UA: NEGATIVE mg/dL
Hgb urine dipstick: NEGATIVE
Ketones, ur: 5 mg/dL — AB
LEUKOCYTES UA: NEGATIVE
NITRITE: NEGATIVE
PROTEIN: NEGATIVE mg/dL
pH: 5 (ref 5.0–8.0)

## 2018-04-22 LAB — COMPREHENSIVE METABOLIC PANEL
ALBUMIN: 4.3 g/dL (ref 3.5–5.0)
ALT: 45 U/L — ABNORMAL HIGH (ref 0–44)
ANION GAP: 11 (ref 5–15)
AST: 31 U/L (ref 15–41)
Alkaline Phosphatase: 55 U/L (ref 38–126)
BILIRUBIN TOTAL: 1.1 mg/dL (ref 0.3–1.2)
BUN: 13 mg/dL (ref 6–20)
CO2: 22 mmol/L (ref 22–32)
Calcium: 8.9 mg/dL (ref 8.9–10.3)
Chloride: 102 mmol/L (ref 98–111)
Creatinine, Ser: 0.65 mg/dL (ref 0.44–1.00)
GFR calc non Af Amer: >60 mL/min (ref >60)
GLUCOSE: 206 mg/dL — AB (ref 70–99)
POTASSIUM: 3.8 mmol/L (ref 3.5–5.1)
Sodium: 135 mmol/L (ref 135–145)
TOTAL PROTEIN: 7.9 g/dL (ref 6.5–8.1)

## 2018-04-22 LAB — CBC
HCT: 47.3 % — ABNORMAL HIGH (ref 36.0–46.0)
HEMOGLOBIN: 14.5 g/dL (ref 12.0–15.0)
MCH: 27.5 pg (ref 26.0–34.0)
MCHC: 30.7 g/dL (ref 30.0–36.0)
MCV: 89.8 fL (ref 80.0–100.0)
NRBC: 0 % (ref 0.0–0.2)
PLATELETS: 238 10*3/uL (ref 150–400)
RBC: 5.27 MIL/uL — AB (ref 3.87–5.11)
RDW: 14.2 % (ref 11.5–15.5)
WBC: 5.5 10*3/uL (ref 4.0–10.5)

## 2018-04-22 MED ORDER — SODIUM CHLORIDE 0.9 % IV BOLUS
1000.0000 mL | Freq: Once | INTRAVENOUS | Status: AC
  Administered 2018-04-22: 1000 mL via INTRAVENOUS

## 2018-04-22 MED ORDER — ONDANSETRON HCL 4 MG/2ML IJ SOLN
4.0000 mg | Freq: Once | INTRAMUSCULAR | Status: AC
  Administered 2018-04-22: 4 mg via INTRAVENOUS
  Filled 2018-04-22: qty 2

## 2018-04-22 MED ORDER — ONDANSETRON 4 MG PO TBDP: 4.0000 mg | ORAL_TABLET | Freq: Three times a day (TID) | ORAL | 1 refills | Status: DC | PRN

## 2018-04-22 MED ORDER — HYDROMORPHONE HCL 1 MG/ML IJ SOLN
0.5000 mg | Freq: Once | INTRAMUSCULAR | Status: AC
  Administered 2018-04-22: 0.5 mg via INTRAVENOUS
  Filled 2018-04-22: qty 1

## 2018-04-22 MED ORDER — SODIUM CHLORIDE 0.9 % IV SOLN
INTRAVENOUS | Status: DC
  Administered 2018-04-22: 22:00:00 via INTRAVENOUS

## 2018-04-22 MED ORDER — IOPAMIDOL (ISOVUE-300) INJECTION 61%
100.0000 mL | Freq: Once | INTRAVENOUS | Status: AC | PRN
  Administered 2018-04-22: 100 mL via INTRAVENOUS

## 2018-04-22 NOTE — ED Notes (Signed)
ED Provider at bedside. 

## 2018-04-22 NOTE — ED Triage Notes (Signed)
Pt c/o generalized abd pain with diarrhea and vomiting at times x 3-4 days. Pt states she feels weak all over.

## 2018-04-22 NOTE — Patient Instructions (Addendum)
1. 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  - You tolerated the peanut challenge today. - You can put peanuts back into your diet.  - To fully work up these reactions, I think we need to get you in for a walnut challenge too.  - Continue to avoid kiwi.  2. Return in about 4 weeks (around 05/20/2018) for walnut challenge.   Please inform us of any Emergency Department visits, hospitalizations, or changes in symptoms. Call us before going to the ED for breathing or allergy symptoms since we might be able to fit you in for a sick visit. Feel free to contact us anytime with any questions, problems, or concerns.  It was a pleasure to see you again today! Tell Rolan Bucco that we all said CONGRATS!   Websites that have reliable patient information: 1. American Academy of Asthma, Allergy, and Immunology: www.aaaai.org 2. Food Allergy Research and Education (FARE): foodallergy.org 3. Mothers of Asthmatics: http://www.asthmacommunitynetwork.org 4. American College of Allergy, Asthma, and Immunology: MonthlyElectricBill.co.uk   Make sure you are registered to vote! If you have moved or changed any of your contact information, you will need to get this updated before voting!

## 2018-04-22 NOTE — Discharge Instructions (Signed)
CT scan of the abdomen without any acute findings.  Labs without any significant abnormalities.  Take the Zofran as needed for the nausea.  Take pain medicine you have at home for the abdominal pain.

## 2018-04-22 NOTE — ED Provider Notes (Signed)
[facility] EMERGENCY DEPARTMENT Provider Note   CSN: [id number] Arrival date & time: 04/22/18  1849     History   Chief Complaint Chief Complaint  Patient presents with  . Abdominal Pain    HPI Sandy Valencia is a 57 y.o. female.  Patient today with onset of abdominal pain onset was at 1300.  But did have some mild abdominal discomfort for the past 3 days all over.  But now is predominantly left side.  There is a little bit of diarrhea yesterday as well.  Pain increased today at 1300 and predominantly left side.  No fevers no dysuria.  No history of similar pain.  Patient stated that she vomited today about 3-4 times.     Past Medical History:  Diagnosis Date  . Angio-edema   . Arthritis   . Asthma   . Diabetes mellitus   . Fatty liver   . GERD (gastroesophageal reflux disease)   . Hypercholesteremia   . Hypertension   . Hypothyroidism   . Sleep apnea    pt siad, "i had small amount but could not tolerate CPAP and they said it was ok since it was mild.    Patient Active Problem List   Diagnosis Date Noted  . Mass of finger, right s/p surgical excision (mucoid cyst) 04/17/18 04/22/2018  . History of colonic polyps 08/26/2017  . Hypothyroidism 04/22/2017  . Mixed hyperlipidemia 12/31/2016  . Mucous cyst of digit of right hand   . Uncontrolled type 2 diabetes mellitus with complication, with long-term current use of insulin ([place]) 05/18/2015  . Essential hypertension, benign 05/18/2015  . Class 2 severe obesity due to excess calories with serious comorbidity and body mass index (BMI) of 38.0 to 38.9 in adult ([place]) 05/18/2015  . Personal history of noncompliance with medical treatment, presenting hazards to health 05/18/2015  . GERD (gastroesophageal reflux disease) 02/14/2015  . Anemia, iron deficiency 02/14/2015  . Fatty liver 02/14/2015  . Hiatal hernia   . Dysphagia, pharyngoesophageal phase   . Dysphagia 10/11/2014  . Dyspepsia 10/11/2014  . Family history  of colon cancer 09/03/2012    Past Surgical History:  Procedure Laterality Date  . ABDOMINAL HYSTERECTOMY    . CHOLECYSTECTOMY    . COLONOSCOPY N/A 09/22/2012   RMR: colonic polyps -removed as described above. tubular adenoma, next TCS 09/2017  . COLONOSCOPY WITH PROPOFOL N/A 09/23/2017   Procedure: COLONOSCOPY WITH PROPOFOL;  Surgeon: Daneil Dolin, MD;  Location: [facility] ENDO SUITE;  Service: Endoscopy;  Laterality: N/A;  7:30am  . CYST EXCISION Left 07/19/2016   Procedure: CYST REMOVAL LEFT RING FINGER;  Surgeon: Carole Civil, MD;  Location: [facility] ORS;  Service: Orthopedics;  Laterality: Left;  . CYST REMOVAL LEG Right    foot  . ESOPHAGOGASTRODUODENOSCOPY N/A 10/21/2014   RMR: Small hiatal hernia; otherwise normal EGD status post passage of a Maloney dilator.   Marland Kitchen EXCISION MASS UPPER EXTREMETIES Right 04/17/2018   Procedure: EXCISION MASS UPPER EXTREMETIES right ring finger;  Surgeon: Carole Civil, MD;  Location: [facility] ORS;  Service: Orthopedics;  Laterality: Right;  . MALONEY DILATION N/A 10/21/2014   Procedure: [name] DILATION;  Surgeon: Daneil Dolin, MD;  Location: [facility] END[facility] SUITE;  Service: Endoscopy;  Laterality: N/A;  . MIDDLE EAR SURGERY Right    patched hole in ear drum  . POLYPECTOMY  09/23/2017   Procedure: POLYPECTOMY;  Surgeon: Daneil Dolin, MD;  Location: [facility] SU[facility];  Service: Endoscopy;;  polyp hepatic flexure  polyp cs, splenic flexure polyp cs, rectal polyp cs     OB History   None      Home Medications    Prior to Admission medications   Medication Sig Start Date End Date Taking? Authorizing Provider  albuterol (PROVENTIL HFA) 108 (90 Base) MCG/ACT inhaler Inhale 2 puffs into the lungs every 6 (six) hours as needed for wheezing or shortness of breath. 02/18/18   Valentina Shaggy, MD  aspirin EC 81 MG tablet Take 81 mg by mouth daily.    [provider]  Cholecalciferol (VITAMIN D-3) 1000 UNITS CAPS Take 1,000 Units by mouth daily.      [provider]  fluticasone (FLOVENT HFA) 220 MCG/ACT inhaler Inhale 2 puffs into the lungs 2 (two) times daily.     [provider]  glucose blood (ACCU-CHEK COMPACT PLUS) test strip Use as instructed 05/18/15   Cassandria Anger, MD  HYDROcodone-acetaminophen (NORCO) 7.5-325 MG tablet Take 1 tablet by mouth every 6 (six) hours as needed for moderate pain. 07/19/16   Carole Civil, MD  insulin aspart (NOVOLOG FLEXPEN) 100 UNIT/ML FlexPen Inject 28-34 Units into the skin 3 (three) times daily with meals. Patient taking differently: Inject 25-28 Units into the skin 3 (three) times daily with meals.  02/11/18   Cassandria Anger, MD  LANTUS SOLOSTAR 100 UNIT/ML Solostar Pen INJECT 80 UP TO 100 UNITS IN THE SKIN DAILY AT 10AM Patient taking differently: Inject 80 Units into the skin at bedtime.  03/18/18   Cassandria Anger, MD  levothyroxine (SYNTHROID, LEVOTHROID) 75 MCG tablet Take 1 tablet (75 mcg total) by mouth daily before breakfast. 09/10/17   Nida, Marella Chimes, MD  lisinopril-hydrochlorothiazide (PRINZIDE,ZESTORETIC) 20-25 MG tablet TAKE ONE TABLET BY MOUTH EVERY DAY. 05/15/17   Cassandria Anger, MD  metFORMIN (GLUCOPHAGE) 500 MG tablet Take 500 mg by mouth 2 (two) times daily with a meal.    [provider]  metFORMIN (GLUCOPHAGE-XR) 500 MG 24 hr tablet TAKE 1 TABLET (500 MG TOTAL) BY MOUTH 2 TIMES DAILY AFTER A MEAL 04/14/18   Nida, Marella Chimes, MD  ondansetron (ZOFRAN ODT) 4 MG disintegrating tablet Take 1 tablet (4 mg total) by mouth every 8 (eight) hours as needed. 04/22/18   Fredia Sorrow, MD  pantoprazole (PROTONIX) 40 MG tablet TAKE ONE (1) TABLET EACH DAY Patient taking differently: Take 40 mg by mouth daily.  04/12/17   Mahala Menghini, PA-C  Semaglutide (OZEMPIC) 0.25 or 0.5 MG/DOSE SOPN Inject 0.5 mg into the skin once a week. Patient taking differently: Inject 0.5 mg into the skin every Wednesday.  02/11/18   Cassandria Anger, MD  simvastatin (ZOCOR) 10 MG tablet Take 1 tablet (10 mg total) by mouth daily. 04/14/18   Cassandria Anger, MD  vitamin B-12 (CYANOCOBALAMIN) 500 MCG tablet Take 500 mcg by mouth daily.    [provider]    Family History Family History  Problem Relation Age of Onset  . Colon cancer Mother        diagnosed age 66, underwent surgical resection, metastatic disease several years later  . Diabetes Mother   . Heart attack Father   . Diabetes Sister   . Hypothyroidism Brother   . Diabetes Sister   . Diabetes Sister   . Diabetes Sister   . Hypothyroidism Sister     Social History Social History   Tobacco Use  . Smoking status: Never Smoker  . Smokeless tobacco: Never Used  Substance Use Topics  . Alcohol use: No    Alcohol/week: 0.0 standard drinks  . Drug use: No     Allergies   Kiwi extract and Other   Review of Systems Review of Systems  Constitutional: Negative for fever.  HENT: Negative for congestion.   Eyes: Negative for redness.  Respiratory: Negative for shortness of breath.   Cardiovascular: Negative for chest pain.  Gastrointestinal: Positive for abdominal pain, nausea and vomiting.  Genitourinary: Negative for dysuria.  Musculoskeletal: Negative for back pain.  Skin: Negative for rash.  Neurological: Negative for syncope and headaches.  Hematological: Does not bruise/bleed easily.  Psychiatric/Behavioral: Negative for confusion.     Physical Exam Updated Vital Signs BP (!) 141/79   Pulse 81   Temp 98.6 F (37 C) (Oral)   Resp 18   Ht 1.676 m (5\' 6" )   Wt 108 kg   SpO2 92%   BMI 38.41 kg/m   Physical Exam  Constitutional: She is oriented to person, place, and time. She appears well-developed and well-nourished.  HENT:  Head: Normocephalic and atraumatic.  Mucous membranes slightly dry.  Eyes: Pupils are equal, round, and reactive to light. Conjunctivae and EOM are normal.  Neck: Neck supple.    Cardiovascular: Normal rate, regular rhythm and normal heart sounds.  Pulmonary/Chest: Effort normal and breath sounds normal. No respiratory distress.  Abdominal: Soft. Bowel sounds are normal. She exhibits no mass. There is no tenderness. There is no guarding.  Musculoskeletal: Normal range of motion. She exhibits no edema.  Neurological: She is alert and oriented to person, place, and time. No cranial nerve deficit or sensory deficit. She exhibits normal muscle tone. Coordination normal.  Skin: Skin is warm. No rash noted.  Nursing note and vitals reviewed.    ED Treatments / Results  Labs (all labs ordered are listed, but only abnormal results are displayed) Labs Reviewed  COMPREHENSIVE METABOLIC PANEL - Abnormal; Notable for the following components:      Result Value   Glucose, Bld 206 (*)    ALT 45 (*)    All other components within normal limits  CBC - Abnormal; Notable for the following components:   RBC 5.27 (*)    HCT 47.3 (*)    All other components within normal limits  URINALYSIS, ROUTINE W REFLEX MICROSCOPIC - Abnormal; Notable for the following components:   Specific Gravity, Urine >1.046 (*)    Ketones, ur 5 (*)    All other components within normal limits  LIPASE, BLOOD    EKG None  Radiology Ct Abdomen Pelvis W Contrast  Result Date: 04/22/2018 CLINICAL DATA:  Generalized abdominal pain with diarrhea and vomiting for 4 days. Weakness. EXAM: CT ABDOMEN AND PELVIS WITH CONTRAST TECHNIQUE: Multidetector CT imaging of the abdomen and pelvis was performed using the standard protocol following bolus administration of intravenous contrast. CONTRAST:  134mL ISOVUE-300 IOPAMIDOL (ISOVUE-300) INJECTION 61% COMPARISON:  07/03/2004 FINDINGS: Lower chest: Lung bases are clear. Hepatobiliary: Diffuse fatty infiltration of the liver. No focal liver abnormality is seen. Status post cholecystectomy. No biliary dilatation. Pancreas: Unremarkable. No pancreatic ductal  dilatation or surrounding inflammatory changes. Spleen: Normal in size without focal abnormality. Adrenals/Urinary Tract: Adrenal glands are unremarkable. Kidneys are normal, without renal calculi, focal lesion, or hydronephrosis. Bladder is unremarkable. Stomach/Bowel: Stomach is within normal limits. Appendix appears normal. No evidence of bowel wall thickening, distention, or inflammatory changes. Vascular/Lymphatic: Aortic atherosclerosis. No enlarged abdominal or pelvic lymph nodes. Reproductive: Status post hysterectomy. No adnexal masses.  Other: No abdominal wall hernia or abnormality. No abdominopelvic ascites. Musculoskeletal: Degenerative changes in the spine. No destructive bone lesions. IMPRESSION: No acute process demonstrated in the abdomen or pelvis. No evidence of bowel obstruction or inflammation. Diffuse fatty infiltration of the liver. Electronically Signed   By: Lucienne Capers M.D.   On: 04/22/2018 21:31    Procedures Procedures (including critical care time)  Medications Ordered in ED Medications  0.9 %  sodium chloride infusion ( Intravenous New Bag/Given 04/22/18 2215)  sodium chloride 0.9 % bolus 1,000 mL (0 mLs Intravenous Stopped 04/22/18 2214)  ondansetron (ZOFRAN) injection 4 mg (4 mg Intravenous Given 04/22/18 2101)  HYDROmorphone (DILAUDID) injection 0.5 mg (0.5 mg Intravenous Given 04/22/18 2101)  iopamidol (ISOVUE-300) 61 % injection 100 mL (100 mLs Intravenous Contrast Given 04/22/18 2111)     Initial Impression / Assessment and Plan / ED Course  I have reviewed the triage vital signs and the nursing notes.  Pertinent labs & imaging results that were available during my care of the patient were reviewed by me and considered in my medical decision making (see chart for details).    Work-up without any significant findings.  CT scan of the abdomen did not have any acute abdominal findings.  Clinically thought may be would find diverticulitis.  Patient was  significant improvement with treatment for pain and nausea here.  Will continue same treatment at home.  Have patient follow-up with Dr. Luan Pulling her primary care doctor.  She will return for any new or worse symptoms.  Patient with known history of diabetes.  Blood sugar slightly elevated here today.   Final Clinical Impressions(s) / ED Diagnoses   Final diagnoses:  Lower abdominal pain    ED Discharge Orders         Ordered    ondansetron (ZOFRAN ODT) 4 MG disintegrating tablet  Every 8 hours PRN     04/22/18 2333           Fredia Sorrow, MD 04/23/18 1631

## 2018-04-22 NOTE — Progress Notes (Signed)
FOLLOW UP  Date of Service/Encounter:  04/22/18   Assessment:   Anaphylactic shock due to food  Sandy Valencia passed her peanut challenge today, therefore I recommended that she introduce them liberally into her diet. She ideally would undergo a walnut challenge to completely rid her history of any food allergies (aside from kiwi), but she tells me today that she does not like walnuts and is not interested in doing the challenge at all.    Plan/Recommendations:   1. 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  - You tolerated the peanut challenge today. - You can put peanuts back into your diet.  - To fully work up these reactions, I think we need to get you in for a walnut challenge too.  - Continue to avoid kiwi.  2. Return in about 4 weeks (around 05/20/2018) for walnut challenge.  Subjective:   Sandy Valencia is a 57 y.o. female presenting today for follow up of  Chief Complaint  Patient presents with  . Food/Drug Challenge    Peanut Challenge    Sandy Valencia has a history of the following: Patient Active Problem List   Diagnosis Date Noted  . History of colonic polyps 08/26/2017  . Hypothyroidism 04/22/2017  . Mixed hyperlipidemia 12/31/2016  . Mucous cyst of digit of right hand   . Uncontrolled type 2 diabetes mellitus with complication, with long-term current use of insulin ([place]) 05/18/2015  . Essential hypertension, benign 05/18/2015  . Class 2 severe obesity due to excess calories with serious comorbidity and body mass index (BMI) of 38.0 to 38.9 in adult ([place]) 05/18/2015  . Personal history of noncompliance with medical treatment, presenting hazards to health 05/18/2015  . GERD (gastroesophageal reflux disease) 02/14/2015  . Anemia, iron deficiency 02/14/2015  . Fatty liver 02/14/2015  . Hiatal hernia   . Dysphagia, pharyngoesophageal phase   . Dysphagia 10/11/2014  . Dyspepsia 10/11/2014  . Family history of colon cancer 09/03/2012      History obtained from: chart review and patient.  Sandy Valencia Primary Care Provider is Sinda Du, MD.     Sandy Valencia is a 57 y.o. female presenting for a follow up visit. She was last seen in September 2019 as a new patient. At that time, She had testing that was negative to peanuts, tree nuts, seafood, and kiwi. Peanut was completely negative, but the kiwi was positive. Everything else was negative on blood testing as well.   Since the last visit, she has done well. She did bring in some peanut butter for the challenge today. She is feeling good without an illness today. She continues to avoid kiwi and all tree nuts as well as peanut. She is excited about passing the peanut challenge today.   Otherwise, there have been no changes to her past medical history, surgical history, family history, or social history.    Review of Systems: a 14-point review of systems is pertinent for what is mentioned in HPI.  Otherwise, all other systems were negative.  Constitutional: negative other than that listed in the HPI Eyes: negative other than that listed in the HPI Ears, nose, mouth, throat, and face: negative other than that listed in the HPI Respiratory: negative other than that listed in the HPI Cardiovascular: negative other than that listed in the HPI Gastrointestinal: negative other than that listed in the HPI Genitourinary: negative other than that listed in the HPI Integument: negative other than that listed in the HPI Hematologic:  negative other than that listed in the HPI Musculoskeletal: negative other than that listed in the HPI Neurological: negative other than that listed in the HPI Allergy/Immunologic: negative other than that listed in the HPI    Objective:   Blood pressure 130/80, pulse (!) 106, resp. rate 18, height 5' 4.2" (1.631 m), weight 240 lb 3.2 oz (109 kg), SpO2 97 %. Body mass index is 40.97 kg/m.   Physical Exam: deferred since this was challenge  appointment only.  Diagnostic studies:   Open graded peanut butter oral challenge: The patient was able to tolerate the challenge today without adverse signs or symptoms. Vital signs were stable throughout the challenge and observation period. She received multiple doses separated by 15 minutes, each of which was separated by vitals and a brief physical exam. She received the following doses: lip rub, 1 gm, 2 gm, 4 gm, 8 gm and 16 gm. She was monitored for 60 minutes following the last dose.   The patient had negative skin prick tests to peanut and was able to tolerate the open graded oral challenge today without adverse signs or symptoms. Therefore, she has the same risk of systemic reaction associated with the consumption of peanuts as the general population.    Salvatore Marvel, MD  [facility]

## 2018-04-23 ENCOUNTER — Ambulatory Visit (INDEPENDENT_AMBULATORY_CARE_PROVIDER_SITE_OTHER): Payer: Medicaid Other | Admitting: Orthopedic Surgery

## 2018-04-23 ENCOUNTER — Encounter: Payer: Self-pay | Admitting: Orthopedic Surgery

## 2018-04-23 DIAGNOSIS — R2231 Localized swelling, mass and lump, right upper limb: Secondary | ICD-10-CM

## 2018-04-23 NOTE — Progress Notes (Signed)
POSTOP VISIT  POD #6  Chief Complaint  Patient presents with  . Routine Post Op    right ring finger cyst excision 04/17/18    04/17/2018  12:44 PM  PATIENT:  Sandy Valencia  57 y.o. female  PRE-OPERATIVE DIAGNOSIS:  mucous cyst right ring finger  POST-OPERATIVE DIAGNOSIS:  mucous cyst right ring finger  PROCEDURE:  Procedure(s) with comments: EXCISION MASS UPPER EXTREMETIES right ring finger (Right) -mucous cyst right ring finger [id number]:  Excision of lesion of tendon sheath or joint capsule mucous cyst hand or finger   SURGEON:  Surgeon(s) and Role:    Carole Civil, MD - Primary  Operative findings small 2 to 3 x 3 mm mass which was involving the entire dermal layer down to the extensor tendon.   Encounter Diagnosis  Name Primary?  . Mass of finger, right s/p surgical excision (mucoid cyst) 04/17/18     Dressing changed as the entire wound could not be closed  Tube gauze applied with Xeroform, no infection seen  Postoperative plan (Work, WB, No orders of the defined types were placed in this encounter. ,FU)  Come back as noted 27 to get sutures out

## 2018-04-30 ENCOUNTER — Encounter: Payer: Self-pay | Admitting: Orthopedic Surgery

## 2018-04-30 ENCOUNTER — Ambulatory Visit (INDEPENDENT_AMBULATORY_CARE_PROVIDER_SITE_OTHER): Payer: Medicaid Other | Admitting: Orthopedic Surgery

## 2018-04-30 VITALS — BP 168/82 | HR 80 | Ht 64.0 in | Wt 240.0 lb

## 2018-04-30 DIAGNOSIS — R2231 Localized swelling, mass and lump, right upper limb: Secondary | ICD-10-CM

## 2018-04-30 DIAGNOSIS — Z6841 Body Mass Index (BMI) 40.0 and over, adult: Secondary | ICD-10-CM

## 2018-04-30 NOTE — Progress Notes (Signed)
Chief Complaint  Patient presents with  . Routine Post Op    04/17/18 cyst removal    Postop day #13 excision of mass right ring finger  We took the stitches out.  She has a small wound there.  There is some surrounding erythema.  She has full range of motion no drainage.  Recommend soak the finger 15 minutes salt water twice a day apply Neosporin and Band-Aid  Path report soft tissue mass skin with benign cyst  BMI 41 previously discussed  The patient meets the AMA guidelines for Morbid (severe) obesity with a BMI > 40.0 and I have recommended weight loss.   Encounter Diagnosis  Name Primary?  . Mass of finger, right s/p surgical excision (mucoid cyst) 04/17/18 Yes

## 2018-04-30 NOTE — Patient Instructions (Signed)
Soak finger salt water twice a day 15 minutes  Place Neosporin and Band-Aid on the wound  Follow-up 4 weeks

## 2018-05-08 ENCOUNTER — Ambulatory Visit (INDEPENDENT_AMBULATORY_CARE_PROVIDER_SITE_OTHER): Payer: Medicaid Other | Admitting: Otolaryngology

## 2018-05-08 DIAGNOSIS — E118 Type 2 diabetes mellitus with unspecified complications: Secondary | ICD-10-CM | POA: Diagnosis not present

## 2018-05-08 DIAGNOSIS — E039 Hypothyroidism, unspecified: Secondary | ICD-10-CM | POA: Diagnosis not present

## 2018-05-08 DIAGNOSIS — E1165 Type 2 diabetes mellitus with hyperglycemia: Secondary | ICD-10-CM | POA: Diagnosis not present

## 2018-05-08 DIAGNOSIS — H95121 Granulation of postmastoidectomy cavity, right ear: Secondary | ICD-10-CM | POA: Diagnosis not present

## 2018-05-08 DIAGNOSIS — Z794 Long term (current) use of insulin: Secondary | ICD-10-CM | POA: Diagnosis not present

## 2018-05-09 LAB — T4, FREE: Free T4: 1.1 ng/dL (ref 0.8–1.8)

## 2018-05-09 LAB — COMPLETE METABOLIC PANEL WITH GFR
AG RATIO: 1.7 (calc) (ref 1.0–2.5)
ALT: 52 U/L — AB (ref 6–29)
AST: 39 U/L — AB (ref 10–35)
Albumin: 4.4 g/dL (ref 3.6–5.1)
Alkaline phosphatase (APISO): 79 U/L (ref 33–130)
BUN: 17 mg/dL (ref 7–25)
CALCIUM: 9.7 mg/dL (ref 8.6–10.4)
CHLORIDE: 105 mmol/L (ref 98–110)
CO2: 27 mmol/L (ref 20–32)
Creat: 0.74 mg/dL (ref 0.50–1.05)
GFR, EST NON AFRICAN AMERICAN: 91 mL/min/{1.73_m2} (ref >=60)
GFR, Est African American: 105 mL/min/{1.73_m2} (ref >=60)
GLUCOSE: 133 mg/dL — AB (ref 65–99)
Globulin: 2.6 g/dL (calc) (ref 1.9–3.7)
Potassium: 4.2 mmol/L (ref 3.5–5.3)
Sodium: 141 mmol/L (ref 135–146)
Total Bilirubin: 0.3 mg/dL (ref 0.2–1.2)
Total Protein: 7 g/dL (ref 6.1–8.1)

## 2018-05-09 LAB — HEMOGLOBIN A1C
EAG (MMOL/L): 10 (calc)
Hgb A1c MFr Bld: 7.9 % of total Hgb — ABNORMAL HIGH (ref <5.7)
MEAN PLASMA GLUCOSE: 180 (calc)

## 2018-05-09 LAB — TSH: TSH: 2.5 mIU/L (ref 0.40–4.50)

## 2018-05-13 ENCOUNTER — Encounter: Payer: Self-pay | Admitting: "Endocrinology

## 2018-05-13 ENCOUNTER — Ambulatory Visit (INDEPENDENT_AMBULATORY_CARE_PROVIDER_SITE_OTHER): Payer: Medicaid Other | Admitting: "Endocrinology

## 2018-05-13 VITALS — BP 134/86 | HR 84 | Ht 64.0 in | Wt 239.0 lb

## 2018-05-13 DIAGNOSIS — E1165 Type 2 diabetes mellitus with hyperglycemia: Secondary | ICD-10-CM

## 2018-05-13 DIAGNOSIS — Z794 Long term (current) use of insulin: Secondary | ICD-10-CM | POA: Diagnosis not present

## 2018-05-13 DIAGNOSIS — E782 Mixed hyperlipidemia: Secondary | ICD-10-CM

## 2018-05-13 DIAGNOSIS — I10 Essential (primary) hypertension: Secondary | ICD-10-CM | POA: Diagnosis not present

## 2018-05-13 DIAGNOSIS — E118 Type 2 diabetes mellitus with unspecified complications: Secondary | ICD-10-CM | POA: Diagnosis not present

## 2018-05-13 DIAGNOSIS — Z6838 Body mass index (BMI) 38.0-38.9, adult: Secondary | ICD-10-CM

## 2018-05-13 DIAGNOSIS — IMO0002 Reserved for concepts with insufficient information to code with codable children: Secondary | ICD-10-CM

## 2018-05-13 MED ORDER — INSULIN ASPART 100 UNIT/ML FLEXPEN: 20.0000 [IU] | PEN_INJECTOR | Freq: Three times a day (TID) | SUBCUTANEOUS | 2 refills | Status: DC

## 2018-05-13 MED ORDER — SEMAGLUTIDE(0.25 OR 0.5MG/DOS) 2 MG/1.5ML ~~LOC~~ SOPN: 0.5000 mg | PEN_INJECTOR | SUBCUTANEOUS | 3 refills | Status: DC

## 2018-05-13 NOTE — Progress Notes (Signed)
Endocrinology follow-up note   Subjective:    Patient ID: Sandy Valencia, female    DOB: [birth year], PCP Sinda Du, MD   Past Medical History:  Diagnosis Date  . Angio-edema   . Arthritis   . Asthma   . Diabetes mellitus   . Fatty liver   . GERD (gastroesophageal reflux disease)   . Hypercholesteremia   . Hypertension   . Hypothyroidism   . Sleep apnea    pt siad, "i had small amount but could not tolerate CPAP and they said it was ok since it was mild.   Past Surgical History:  Procedure Laterality Date  . ABDOMINAL HYSTERECTOMY    . CHOLECYSTECTOMY    . COLONOSCOPY N/A 09/22/2012   RMR: colonic polyps -removed as described above. tubular adenoma, next TCS 09/2017  . COLONOSCOPY WITH PROPOFOL N/A 09/23/2017   Procedure: COLONOSCOPY WITH PROPOFOL;  Surgeon: Daneil Dolin, MD;  [facility]: [facility];  Service: Endoscopy;  Laterality: N/A;  7:30am  . CYST EXCISION Left 07/19/2016   Procedure: CYST REMOVAL LEFT RING FINGER;  Surgeon: Carole Civil, MD;  Location: [facility];  Service: Orthopedics;  Laterality: Left;  . CYST REMOVAL LEG Right    foot  . ESOPHAGOGASTRODUODENOSCOPY N/A 10/21/2014   RMR: Small hiatal hernia; otherwise normal EGD status post passage of a Maloney dilator.   Marland Kitchen EXCISION MASS UPPER EXTREMETIES Right 04/17/2018   Procedure: EXCISION MASS UPPER EXTREMETIES right ring finger;  Surgeon: Carole Civil, MD;  Location: [facility];  Service: Orthopedics;  Laterality: Right;  . MALONEY DILATION N/A 10/21/2014   Procedure: [name] DILATION;  Surgeon: Daneil Dolin, MD;  Location: [facility];  Service: Endoscopy;  Laterality: N/A;  . MIDDLE EAR SURGERY Right    patched hole in ear drum  . POLYPECTOMY  09/23/2017   Procedure: POLYPECTOMY;  Surgeon: Daneil Dolin, MD;  Location: [facility];  Service: Endoscopy;;  polyp hepatic flexure polyp cs, splenic flexure polyp cs, rectal polyp cs   Social History   Socioeconomic History  . Marital  status: Divorced    Spouse name: Not on file  . Number of children: Not on file  . Years of education: Not on file  . Highest education level: Not on file  Occupational History  . Not on file  Social Needs  . Financial resource strain: Not on file  . Food insecurity:    Worry: Not on file    Inability: Not on file  . Transportation needs:    Medical: Not on file    Non-medical: Not on file  Tobacco Use  . Smoking status: Never Smoker  . Smokeless tobacco: Never Used  Substance and Sexual Activity  . Alcohol use: No    Alcohol/week: 0.0 standard drinks  . Drug use: No  . Sexual activity: Yes    Birth control/protection: Surgical  Lifestyle  . Physical activity:    Days per week: Not on file    Minutes per session: Not on file  . Stress: Not on file  Relationships  . Social connections:    Talks on phone: Not on file    Gets together: Not on file    Attends religious service: Not on file    Active member of club or organization: Not on file    Attends meetings of clubs or organizations: Not on file    Relationship status: Not on file  Other Topics Concern  . Not on file  Social History Narrative  . Not on file   Outpatient Encounter Medications as of 05/13/2018  Medication Sig  . albuterol (PROVENTIL HFA) 108 (90 Base) MCG/ACT inhaler Inhale 2 puffs into the lungs every 6 (six) hours as needed for wheezing or shortness of breath.  Marland Kitchen aspirin EC 81 MG tablet Take 81 mg by mouth daily.  . Cholecalciferol (VITAMIN D-3) 1000 UNITS CAPS Take 1,000 Units by mouth daily.   . fluticasone (FLOVENT HFA) 220 MCG/ACT inhaler Inhale 2 puffs into the lungs 2 (two) times daily.   Marland Kitchen glucose blood (ACCU-CHEK COMPACT PLUS) test strip Use as instructed  . HYDROcodone-acetaminophen (NORCO) 7.5-325 MG tablet Take 1 tablet by mouth every 6 (six) hours as needed for moderate pain.  Marland Kitchen insulin aspart (NOVOLOG FLEXPEN) 100 UNIT/ML FlexPen Inject 20-26 Units into the skin 3 (three) times daily  with meals.  Marland Kitchen LANTUS SOLOSTAR 100 UNIT/ML Solostar Pen INJECT 80 UP TO 100 UNITS IN THE SKIN DAILY AT 10AM (Patient taking differently: Inject 80 Units into the skin at bedtime. )  . levothyroxine (SYNTHROID, LEVOTHROID) 75 MCG tablet Take 1 tablet (75 mcg total) by mouth daily before breakfast.  . lisinopril-hydrochlorothiazide (PRINZIDE,ZESTORETIC) 20-25 MG tablet TAKE ONE TABLET BY MOUTH EVERY DAY.  . metFORMIN (GLUCOPHAGE) 500 MG tablet Take 500 mg by mouth 2 (two) times daily with a meal.  . metFORMIN (GLUCOPHAGE-XR) 500 MG 24 hr tablet TAKE 1 TABLET (500 MG TOTAL) BY MOUTH 2 TIMES DAILY AFTER A MEAL  . ondansetron (ZOFRAN ODT) 4 MG disintegrating tablet Take 1 tablet (4 mg total) by mouth every 8 (eight) hours as needed.  . pantoprazole (PROTONIX) 40 MG tablet TAKE ONE (1) TABLET EACH DAY (Patient taking differently: Take 40 mg by mouth daily. )  . Semaglutide,0.25 or 0.5MG /DOS, (OZEMPIC, 0.25 OR 0.5 MG/DOSE,) 2 MG/1.5ML SOPN Inject 0.5 mg into the skin once a week.  . simvastatin (ZOCOR) 10 MG tablet Take 1 tablet (10 mg total) by mouth daily.  . vitamin B-12 (CYANOCOBALAMIN) 500 MCG tablet Take 500 mcg by mouth daily.  . [DISCONTINUED] insulin aspart (NOVOLOG FLEXPEN) 100 UNIT/ML FlexPen Inject 28-34 Units into the skin 3 (three) times daily with meals. (Patient taking differently: Inject 25-28 Units into the skin 3 (three) times daily with meals. )  . [DISCONTINUED] Semaglutide (OZEMPIC) 0.25 or 0.5 MG/DOSE SOPN Inject 0.5 mg into the skin once a week. (Patient taking differently: Inject 0.5 mg into the skin every Wednesday. )   No facility-administered encounter medications on file as of 05/13/2018.    ALLERGIES: Allergies  Allergen Reactions  . Kiwi Extract Anaphylaxis, Swelling and Palpitations  . Other Anaphylaxis    Walnuts (avoids all tree nuts)   VACCINATION STATUS: There is no immunization history for the selected administration types on file for this patient.  Diabetes   She presents for her follow-up diabetic visit. She has type 2 diabetes mellitus. Her disease course has been improving (She was diagnosed at approximate age of 60 years.). There are no hypoglycemic associated symptoms. Pertinent negatives for hypoglycemia include no confusion, headaches, pallor or seizures. Associated symptoms include polydipsia and polyuria. Pertinent negatives for diabetes include no chest pain and no polyphagia. There are no hypoglycemic complications. Symptoms are improving. There are no diabetic complications. Risk factors for coronary artery disease include dyslipidemia, diabetes mellitus, hypertension and sedentary lifestyle. Current diabetic treatment includes insulin injections. She is compliant with treatment most of the time. Her weight is fluctuating minimally. She is following a generally unhealthy  diet. She never participates in exercise. Her home blood glucose trend is decreasing rapidly. Her breakfast blood glucose range is generally 180-200 mg/dl. Her lunch blood glucose range is generally 180-200 mg/dl. Her dinner blood glucose range is generally 180-200 mg/dl. Her bedtime blood glucose range is generally 180-200 mg/dl. Her overall blood glucose range is 180-200 mg/dl. (She returns with average blood glucose of 247 for the last 90 days, A1c of 9.7% increasing from 9.1%.) An ACE inhibitor/angiotensin II receptor blocker is being taken.  Hyperlipidemia  This is a chronic problem. The current episode started more than 1 year ago. The problem is uncontrolled. Exacerbating diseases include diabetes, hypothyroidism and obesity. Pertinent negatives include no chest pain, myalgias or shortness of breath. Current antihyperlipidemic treatment includes statins. Risk factors for coronary artery disease include diabetes mellitus, dyslipidemia, hypertension, obesity, a sedentary lifestyle and post-menopausal.  Hypertension  This is a chronic problem. The current episode started more than 1  year ago. The problem is uncontrolled. Pertinent negatives include no chest pain, headaches, palpitations or shortness of breath. Risk factors for coronary artery disease include dyslipidemia, diabetes mellitus, obesity and sedentary lifestyle. Past treatments include ACE inhibitors and diuretics.     Review of Systems  Constitutional: Negative for unexpected weight change.  HENT: Negative for trouble swallowing and voice change.   Eyes: Negative for visual disturbance.  Respiratory: Negative for cough, shortness of breath and wheezing.   Cardiovascular: Negative for chest pain, palpitations and leg swelling.  Gastrointestinal: Negative for diarrhea, nausea and vomiting.  Endocrine: Positive for polydipsia and polyuria. Negative for cold intolerance, heat intolerance and polyphagia.  Musculoskeletal: Negative for arthralgias and myalgias.  Skin: Negative for color change, pallor, rash and wound.  Neurological: Negative for seizures and headaches.  Psychiatric/Behavioral: Negative for confusion and suicidal ideas.    Objective:    BP 134/86   Pulse 84   Ht 5\' 4"  (1.626 m)   Wt 239 lb (108.4 kg)   BMI 41.02 kg/m   Wt Readings from Last 3 Encounters:  05/13/18 239 lb (108.4 kg)  04/30/18 240 lb (108.9 kg)  04/23/18 240 lb (108.9 kg)    Physical Exam  Constitutional: She is oriented to person, place, and time. She appears well-developed.  HENT:  Head: Normocephalic and atraumatic.  Eyes: EOM are normal.  Neck: Normal range of motion. Neck supple. No tracheal deviation present. No thyromegaly present.  Cardiovascular: Normal rate.  Pulmonary/Chest: Effort normal.  Abdominal: There is no tenderness. There is no guarding.  Musculoskeletal: Normal range of motion. She exhibits no edema.  Neurological: She is alert and oriented to person, place, and time. No cranial nerve deficit. Coordination normal.  Skin: Skin is warm and dry. No rash noted. No erythema. No pallor.  Psychiatric:  She has a normal mood and affect. Judgment normal.   Lipid Panel     Component Value Date/Time   CHOL 133 04/19/2017 0759   TRIG 62 04/19/2017 0759   HDL 69 04/19/2017 0759   CHOLHDL 1.9 04/19/2017 0759   VLDL 10 03/07/2016 0705   LDLCALC 50 04/19/2017 0759    Results for REYLENE, STAUDER (MRN [id number]) as of 05/13/2018 12:55  Ref. Range 05/08/2018 07:08  eAG (mmol/L) Latest Units: (calc) 10.0  Glucose Latest Ref Range: 65 - 99 mg/dL 133 (H)  Hemoglobin A1C Latest Ref Range: <5.7 % of total Hgb 7.9 (H)  TSH Latest Ref Range: 0.40 - 4.50 mIU/L 2.50  T4,Free(Direct) Latest Ref Range: 0.8 - 1.8 ng/dL 1.1  Assessment & Plan:   1. Uncontrolled type 2 diabetes mellitus with complication, with long-term current use of insulin ([place])  -Her  diabetes is  complicated by history of noncompliance (she recently showed better engagement) and patient remains at  extremely high risk for more acute and chronic complications of diabetes which include CAD, CVA, CKD, retinopathy, and neuropathy. These are all discussed in detail with the patient.  -She returns with persistent and significantly above target blood glucose readings averaging greater than 200 mg/dL.    -Her recent labs show A1c of 7.9%, improving from 9.7%.   - I have re-counseled the patient on diet management and weight loss  by adopting a carbohydrate restricted / protein rich  Diet.  -  Suggestion is made for her to avoid simple carbohydrates  from her diet including Cakes, Sweet Desserts / Pastries, Ice Cream, Soda (diet and regular), Sweet Tea, Candies, Chips, Cookies, Store Bought Juices, Alcohol in Excess of  1-2 drinks a day, Artificial Sweeteners, and "Sugar-free" Products. This will help patient to have stable blood glucose profile and potentially avoid unintended weight gain.  - Patient is advised to stick to a routine mealtimes to eat 3 meals  a day and avoid unnecessary snacks ( to snack only to correct hypoglycemia).  -  I have approached patient with the following individualized plan to manage diabetes and patient agrees.  -She will continue to require intensive treatment with basal/bolus insulin in order for her to achieve and maintain control of diabetes to target. -She is advised to continue Lantus 80 units nightly, decrease NovoLog to 20 units 3 times a day before meals plus correction.  She is advised to continue to use her continuous glucose monitoring device.   - She has tolerated metformin.  She is advised to continue metformin 500 mg ER to twice a day after breakfast and supper.  -She has tolerated Ozempic.  She is advised to increase Ozempic to 0.5 mg of subcutaneously weekly.   - Patient specific target  for A1c; LDL, HDL, Triglycerides, and  Waist Circumference were discussed in detail.  2) BP/HTN: Her blood pressure is controlled to target.  He is advised to continue hydrochlorothiazide/lisinopril 25/20 mg by mouth daily. I counseled her on salt restriction.   3) Lipids/HPL: Her recent lipid panel showed controlled with LDL of 50.  She is advised to continue simvastatin 10 mg p.o. Nightly.  4)  Weight/Diet: She has had no success in weight control.  CDE consult in progress, exercise, and carbohydrates information provided. -She decided against bariatric surgery.   5) Hypothyroidism:  -Her recent labs are consistent with appropriate thyroid hormone replacement. She is advised to continue levothyroxine 75 mcg p.o. every morning.  - We discussed about correct intake of levothyroxine, at fasting, with water, separated by at least 30 minutes from breakfast, and separated by more than 4 hours from calcium, iron, multivitamins, acid reflux medications (PPIs). -Patient is made aware of the fact that thyroid hormone replacement is needed for life, dose to be adjusted by periodic monitoring of thyroid function tests.  6) Chronic Care/Health Maintenance:  -Patient is on ACEI/ARB and Statin medications and  encouraged to continue to follow up with Ophthalmology, Podiatrist at least yearly or according to recommendations, and advised to  stay away from smoking. I have recommended yearly flu vaccine and pneumonia vaccination at least every 5 years; moderate intensity exercise for up to 150 minutes weekly; and  sleep for at least 7 hours a day.  -  I advised patient to maintain close follow up with Sinda Du, MD for primary care needs.  - Time spent with the patient: 25 min, of which >50% was spent in reviewing her blood glucose logs , discussing her hypo- and hyper-glycemic episodes, reviewing her current and  previous labs and insulin doses and developing a plan to avoid hypo- and hyper-glycemia. Please refer to Patient Instructions for Blood Glucose Monitoring and Insulin/Medications Dosing Guide"  in media tab for additional information. Gillian Scarce participated in the discussions, expressed understanding, and voiced agreement with the above plans.  All questions were answered to her satisfaction. she is encouraged to contact clinic should she have any questions or concerns prior to her return visit.  Follow up plan: -Return in about 4 months (around 09/12/2018) for Follow up with Pre-visit Labs, Meter, and Logs.  Glade Lloyd, MD Phone: [phone]  Fax: [phone]   -  This note was partially dictated with voice recognition software. Similar sounding words can be transcribed inadequately or may not  be corrected upon review.  05/13/2018, 12:54 PM

## 2018-05-13 NOTE — Patient Instructions (Signed)

## 2018-05-21 ENCOUNTER — Ambulatory Visit (INDEPENDENT_AMBULATORY_CARE_PROVIDER_SITE_OTHER): Payer: Medicaid Other | Admitting: Allergy & Immunology

## 2018-05-21 ENCOUNTER — Ambulatory Visit: Payer: Medicaid Other | Admitting: Allergy & Immunology

## 2018-05-21 ENCOUNTER — Encounter: Payer: Self-pay | Admitting: Allergy & Immunology

## 2018-05-21 VITALS — BP 120/80 | HR 77 | Resp 16

## 2018-05-21 DIAGNOSIS — J4540 Moderate persistent asthma, uncomplicated: Secondary | ICD-10-CM | POA: Diagnosis not present

## 2018-05-21 DIAGNOSIS — K219 Gastro-esophageal reflux disease without esophagitis: Secondary | ICD-10-CM | POA: Insufficient documentation

## 2018-05-21 DIAGNOSIS — J310 Chronic rhinitis: Secondary | ICD-10-CM

## 2018-05-21 DIAGNOSIS — J4520 Mild intermittent asthma, uncomplicated: Secondary | ICD-10-CM | POA: Diagnosis not present

## 2018-05-21 HISTORY — DX: Chronic rhinitis: J31.0

## 2018-05-21 NOTE — Progress Notes (Signed)
FOLLOW UP  Date of Service/Encounter:  05/21/18   Assessment:   Mild intermittent asthma without complication  Gastroesophageal reflux disease  Chronic rhinitis  Plan/Recommendations:   1. Mild intermittent asthma, uncomplicated - Lung testing looks great today. - Continue with albuterol 4 puffs every 4-6 hours as needed.   - There is no need for a controller medication at this time.   2. Gastroesophageal reflux disease - Continue with pantoprazole 40mg  daily.  3. Return in about 1 year (around 05/22/2019).  Subjective:   Sandy Valencia is a 57 y.o. female presenting today for follow up of  Chief Complaint  Patient presents with  . Asthma    Sandy Valencia has a history of the following: Patient Active Problem List   Diagnosis Date Noted  . Mass of finger, right s/p surgical excision (mucoid cyst) 04/17/18 04/22/2018  . History of colonic polyps 08/26/2017  . Hypothyroidism 04/22/2017  . Mixed hyperlipidemia 12/31/2016  . Mucous cyst of digit of right hand   . Uncontrolled type 2 diabetes mellitus with complication, with long-term current use of insulin ([place]) 05/18/2015  . Essential hypertension, benign 05/18/2015  . Class 2 severe obesity due to excess calories with serious comorbidity and body mass index (BMI) of 38.0 to 38.9 in adult ([place]) 05/18/2015  . Personal history of noncompliance with medical treatment, presenting hazards to health 05/18/2015  . GERD (gastroesophageal reflux disease) 02/14/2015  . Anemia, iron deficiency 02/14/2015  . Fatty liver 02/14/2015  . Hiatal hernia   . Dysphagia, pharyngoesophageal phase   . Dysphagia 10/11/2014  . Dyspepsia 10/11/2014  . Family history of colon cancer 09/03/2012    History obtained from: chart review and patient.  Sandy Valencia Primary Care Provider is Sinda Du, MD.     Sandy Valencia is a 57 y.o. female presenting for a follow up visit.   In the interim, she passed her peanut challenge. She did  have some stomach pain later that night, but then she never contacted [place] about this. Her stomach pain persisted and she was given IM Zofran. After that she was "ready to roll". She was placed on IV fluids as well as a pain medicine. CT scan was negative. Sandy Valencia thinks that this was related to her increase in her weekly medicine for her diabetes.   Since the last visit, she has mostly done well. She has been eating peanut butter since that time without a problem. She also eats [employer] peanut butter cups without a problem.   Asthma/Respiratory Symptom History: Breathing is good. She is on azithromycin for treatment of bronchitis. She does have her albuterol inhaler if needed. She has not needed steroids at all. ACT is 25 today, indicating excellent asthma control.   Allergic Rhinitis Symptom History: Her sneezing has resolved since the last visit. She is not taking any particular medication for this. Testing at the last visit was negative.   Otherwise, there have been no changes to her past medical history, surgical history, family history, or social history.    Review of Systems: a 14-point review of systems is pertinent for what is mentioned in HPI.  Otherwise, all other systems were negative.  Constitutional: negative other than that listed in the HPI Eyes: negative other than that listed in the HPI Ears, nose, mouth, throat, and face: negative other than that listed in the HPI Respiratory: negative other than that listed in the HPI Cardiovascular: negative other than that listed in the HPI Gastrointestinal: negative other  than that listed in the HPI Genitourinary: negative other than that listed in the HPI Integument: negative other than that listed in the HPI Hematologic: negative other than that listed in the HPI Musculoskeletal: negative other than that listed in the HPI Neurological: negative other than that listed in the HPI Allergy/Immunologic: negative other than that listed in  the HPI    Objective:   Blood pressure 120/80, pulse 77, resp. rate 16, SpO2 97 %. There is no height or weight on file to calculate BMI.   Physical Exam:  General: Alert, interactive, in no acute distress. Very talkative female.  Eyes: No conjunctival injection bilaterally, no discharge on the right, no discharge on the left and no Horner-Trantas dots present. PERRL bilaterally. EOMI without pain. No photophobia.  Ears: Right TM pearly gray with normal light reflex, Left TM pearly gray with normal light reflex, Right TM intact without perforation and Left TM intact without perforation.  Nose/Throat: External nose within normal limits and septum midline. Turbinates edematous and pale without discharge. Posterior oropharynx mildly erythematous without cobblestoning in the posterior oropharynx. Tonsils 2+ without exudates.  Tongue without thrush. Lungs: Clear to auscultation without wheezing, rhonchi or rales. No increased work of breathing. CV: Normal S1/S2. No murmurs. Capillary refill <2 seconds.  Skin: Warm and dry, without lesions or rashes. Neuro:   Grossly intact. No focal deficits appreciated. Responsive to questions.  Diagnostic studies:   Spirometry: results normal (FEV1: 2.35/96%, FVC: 2.67/82%, FEV1/FVC: 87%).    Spirometry consistent with normal pattern.   Allergy Studies: none       Salvatore Marvel, MD  [facility]

## 2018-05-21 NOTE — Patient Instructions (Addendum)
1. Mild intermittent asthma, uncomplicated - Lung testing looks great today. - Continue with albuterol 4 puffs every 4-6 hours as needed.   - There is no need for a controller medication at this time.   2. Gastroesophageal reflux disease - Continue with pantoprazole 40mg  daily.  3. Return in about 1 year (around 05/22/2019).   Please inform us of any Emergency Department visits, hospitalizations, or changes in symptoms. Call us before going to the ED for breathing or allergy symptoms since we might be able to fit you in for a sick visit. Feel free to contact us anytime with any questions, problems, or concerns.  It was a pleasure to see you again today!  Websites that have reliable patient information: 1. American Academy of Asthma, Allergy, and Immunology: www.aaaai.org 2. Food Allergy Research and Education (FARE): foodallergy.org 3. Mothers of Asthmatics: http://www.asthmacommunitynetwork.org 4. American College of Allergy, Asthma, and Immunology: MonthlyElectricBill.co.uk   Make sure you are registered to vote! If you have moved or changed any of your contact information, you will need to get this updated before voting!

## 2018-05-23 ENCOUNTER — Ambulatory Visit: Payer: Medicaid Other | Admitting: Allergy & Immunology

## 2018-05-26 ENCOUNTER — Encounter: Payer: Self-pay | Admitting: Orthopedic Surgery

## 2018-05-26 ENCOUNTER — Ambulatory Visit (INDEPENDENT_AMBULATORY_CARE_PROVIDER_SITE_OTHER): Payer: Medicaid Other | Admitting: Orthopedic Surgery

## 2018-05-26 VITALS — BP 159/89 | HR 79 | Ht 64.0 in | Wt 240.0 lb

## 2018-05-26 DIAGNOSIS — R2231 Localized swelling, mass and lump, right upper limb: Secondary | ICD-10-CM

## 2018-05-26 NOTE — Progress Notes (Signed)
Encounter Diagnosis  Name Primary?  . Mass of finger, right s/p surgical excision (mucoid cyst) 04/17/18 Yes    Status post removal of the mucous cyst right ring finger doing well regained full range of motion wound is closed no signs of infection patient released follow-up as needed

## 2018-06-16 ENCOUNTER — Other Ambulatory Visit: Payer: Self-pay | Admitting: "Endocrinology

## 2018-07-07 DIAGNOSIS — E1165 Type 2 diabetes mellitus with hyperglycemia: Secondary | ICD-10-CM | POA: Diagnosis not present

## 2018-07-07 DIAGNOSIS — M545 Low back pain: Secondary | ICD-10-CM | POA: Diagnosis not present

## 2018-07-07 DIAGNOSIS — I10 Essential (primary) hypertension: Secondary | ICD-10-CM | POA: Diagnosis not present

## 2018-07-07 DIAGNOSIS — J45909 Unspecified asthma, uncomplicated: Secondary | ICD-10-CM | POA: Diagnosis not present

## 2018-07-18 ENCOUNTER — Other Ambulatory Visit: Payer: Self-pay | Admitting: "Endocrinology

## 2018-08-15 ENCOUNTER — Other Ambulatory Visit: Payer: Self-pay | Admitting: "Endocrinology

## 2018-09-11 DIAGNOSIS — E1165 Type 2 diabetes mellitus with hyperglycemia: Secondary | ICD-10-CM | POA: Diagnosis not present

## 2018-09-11 DIAGNOSIS — Z794 Long term (current) use of insulin: Secondary | ICD-10-CM | POA: Diagnosis not present

## 2018-09-11 DIAGNOSIS — E118 Type 2 diabetes mellitus with unspecified complications: Secondary | ICD-10-CM | POA: Diagnosis not present

## 2018-09-12 LAB — COMPLETE METABOLIC PANEL WITH GFR
AG Ratio: 1.8 (calc) (ref 1.0–2.5)
ALBUMIN MSPROF: 4.5 g/dL (ref 3.6–5.1)
ALKALINE PHOSPHATASE (APISO): 64 U/L (ref 37–153)
ALT: 36 U/L — ABNORMAL HIGH (ref 6–29)
AST: 27 U/L (ref 10–35)
BILIRUBIN TOTAL: 0.5 mg/dL (ref 0.2–1.2)
BUN: 14 mg/dL (ref 7–25)
CHLORIDE: 103 mmol/L (ref 98–110)
CO2: 26 mmol/L (ref 20–32)
Calcium: 9.6 mg/dL (ref 8.6–10.4)
Creat: 0.79 mg/dL (ref 0.50–1.05)
GFR, Est African American: 96 mL/min/{1.73_m2} (ref >=60)
GFR, Est Non African American: 83 mL/min/{1.73_m2} (ref >=60)
GLOBULIN: 2.5 g/dL (ref 1.9–3.7)
Glucose, Bld: 135 mg/dL — ABNORMAL HIGH (ref 65–99)
POTASSIUM: 4.2 mmol/L (ref 3.5–5.3)
SODIUM: 138 mmol/L (ref 135–146)
Total Protein: 7 g/dL (ref 6.1–8.1)

## 2018-09-12 LAB — HEMOGLOBIN A1C
EAG (MMOL/L): 10.4 (calc)
Hgb A1c MFr Bld: 8.2 % of total Hgb — ABNORMAL HIGH (ref <5.7)
Mean Plasma Glucose: 189 (calc)

## 2018-09-15 ENCOUNTER — Ambulatory Visit (INDEPENDENT_AMBULATORY_CARE_PROVIDER_SITE_OTHER): Payer: Medicaid Other | Admitting: "Endocrinology

## 2018-09-15 ENCOUNTER — Other Ambulatory Visit: Payer: Self-pay

## 2018-09-15 ENCOUNTER — Encounter: Payer: Self-pay | Admitting: "Endocrinology

## 2018-09-15 DIAGNOSIS — IMO0002 Reserved for concepts with insufficient information to code with codable children: Secondary | ICD-10-CM

## 2018-09-15 DIAGNOSIS — E118 Type 2 diabetes mellitus with unspecified complications: Secondary | ICD-10-CM | POA: Diagnosis not present

## 2018-09-15 DIAGNOSIS — E039 Hypothyroidism, unspecified: Secondary | ICD-10-CM

## 2018-09-15 DIAGNOSIS — E782 Mixed hyperlipidemia: Secondary | ICD-10-CM | POA: Diagnosis not present

## 2018-09-15 DIAGNOSIS — Z794 Long term (current) use of insulin: Secondary | ICD-10-CM

## 2018-09-15 DIAGNOSIS — E1165 Type 2 diabetes mellitus with hyperglycemia: Secondary | ICD-10-CM

## 2018-09-15 MED ORDER — SEMAGLUTIDE (1 MG/DOSE) 2 MG/1.5ML ~~LOC~~ SOPN: 1.0000 mg | PEN_INJECTOR | SUBCUTANEOUS | 2 refills | Status: DC

## 2018-09-15 NOTE — Progress Notes (Signed)
Endocrinology Telephone Visit Follow up Note -During COVID -19 Pandemic    Subjective:    Patient ID: Sandy Valencia, female    DOB: [birth year], PCP Sinda Du, MD   Past Medical History:  Diagnosis Date  . Angio-edema   . Arthritis   . Asthma   . Diabetes mellitus   . Fatty liver   . GERD (gastroesophageal reflux disease)   . Hypercholesteremia   . Hypertension   . Hypothyroidism   . Sleep apnea    pt siad, "i had small amount but could not tolerate CPAP and they said it was ok since it was mild.   Past Surgical History:  Procedure Laterality Date  . ABDOMINAL HYSTERECTOMY    . CHOLECYSTECTOMY    . COLONOSCOPY N/A 09/22/2012   RMR: colonic polyps -removed as described above. tubular adenoma, next TCS 09/2017  . COLONOSCOPY WITH PROPOFOL N/A 09/23/2017   Procedure: COLONOSCOPY WITH PROPOFOL;  Surgeon: Daneil Dolin, MD;  Location: [facility];  Service: Endoscopy;  Laterality: N/A;  7:30am  . CYST EXCISION Left 07/19/2016   Procedure: CYST REMOVAL LEFT RING FINGER;  Surgeon: Carole Civil, MD;  Location: [facility];  Service: Orthopedics;  Laterality: Left;  . CYST REMOVAL LEG Right    foot  . ESOPHAGOGASTRODUODENOSCOPY N/A 10/21/2014   RMR: Small hiatal hernia; otherwise normal EGD status post passage of a Maloney dilator.   Marland Kitchen EXCISION MASS UPPER EXTREMETIES Right 04/17/2018   Procedure: EXCISION MASS UPPER EXTREMETIES right ring finger;  Surgeon: Carole Civil, MD;  Location: [facility];  Service: Orthopedics;  Laterality: Right;  . MALONEY DILATION N/A 10/21/2014   Procedure: [name] DILATION;  Surgeon: Daneil Dolin, MD;  Location: [facility];  Service: Endoscopy;  Laterality: N/A;  . MIDDLE EAR SURGERY Right    patched hole in ear drum  . POLYPECTOMY  09/23/2017   Procedure: POLYPECTOMY;  Surgeon: Daneil Dolin, MD;  Location: [facility];  Service: Endoscopy;;  polyp hepatic flexure polyp cs, splenic  flexure polyp cs, rectal polyp cs   Social History   Socioeconomic History  . Marital status: Divorced    Spouse name: Not on file  . Number of children: Not on file  . Years of education: Not on file  . Highest education level: Not on file  Occupational History  . Not on file  Social Needs  . Financial resource strain: Not on file  . Food insecurity:    Worry: Not on file    Inability: Not on file  . Transportation needs:    Medical: Not on file    Non-medical: Not on file  Tobacco Use  . Smoking status: Never Smoker  . Smokeless tobacco: Never Used  Substance and Sexual Activity  . Alcohol use: No    Alcohol/week: 0.0 standard drinks  . Drug use: No  . Sexual activity: Yes    Birth control/protection: Surgical  Lifestyle  . Physical activity:    Days per week: Not on file    Minutes per session: Not on file  . Stress: Not on file  Relationships  . Social connections:    Talks on phone: Not on file  Gets together: Not on file    Attends religious service: Not on file    Active member of club or organization: Not on file    Attends meetings of clubs or organizations: Not on file    Relationship status: Not on file  Other Topics Concern  . Not on file  Social History Narrative  . Not on file   Outpatient Encounter Medications as of 09/15/2018  Medication Sig  . albuterol (PROVENTIL HFA) 108 (90 Base) MCG/ACT inhaler Inhale 2 puffs into the lungs every 6 (six) hours as needed for wheezing or shortness of breath.  Marland Kitchen aspirin EC 81 MG tablet Take 81 mg by mouth daily.  . Cholecalciferol (VITAMIN D-3) 1000 UNITS CAPS Take 1,000 Units by mouth daily.   . fluticasone (FLOVENT HFA) 220 MCG/ACT inhaler Inhale 2 puffs into the lungs 2 (two) times daily.   Marland Kitchen glucose blood (ACCU-CHEK COMPACT PLUS) test strip Use as instructed  . HYDROcodone-acetaminophen (NORCO) 7.5-325 MG tablet Take 1 tablet by mouth every 6 (six) hours as needed for moderate pain.  Marland Kitchen insulin aspart  (NOVOLOG FLEXPEN) 100 UNIT/ML FlexPen Inject 20-26 Units into the skin 3 (three) times daily with meals.  Marland Kitchen LANTUS SOLOSTAR 100 UNIT/ML Solostar Pen INJECT 80 UP TO 100 UNITS IN THE SKIN DAILY AT 10:00AM  . levothyroxine (SYNTHROID, LEVOTHROID) 75 MCG tablet TAKE ONE TABLET (75MG  TOTAL) BY MOUTH DAILY BEFORE BREAKFAST  . lisinopril-hydrochlorothiazide (PRINZIDE,ZESTORETIC) 20-25 MG tablet TAKE ONE TABLET BY MOUTH EVERY DAY.  . metFORMIN (GLUCOPHAGE) 500 MG tablet Take 500 mg by mouth 2 (two) times daily with a meal.  . metFORMIN (GLUCOPHAGE-XR) 500 MG 24 hr tablet TAKE ONE TABLET (500MG  TOTAL) BY MOUTH TWO TIMES DAILY AFTER A MEAL  . ondansetron (ZOFRAN ODT) 4 MG disintegrating tablet Take 1 tablet (4 mg total) by mouth every 8 (eight) hours as needed.  . pantoprazole (PROTONIX) 40 MG tablet TAKE ONE (1) TABLET EACH DAY (Patient taking differently: Take 40 mg by mouth daily. )  . Semaglutide, 1 MG/DOSE, (OZEMPIC, 1 MG/DOSE,) 2 MG/1.5ML SOPN Inject 1 mg into the skin once a week.  . simvastatin (ZOCOR) 10 MG tablet Take 1 tablet (10 mg total) by mouth daily.  . vitamin B-12 (CYANOCOBALAMIN) 500 MCG tablet Take 500 mcg by mouth daily.  . [DISCONTINUED] Semaglutide,0.25 or 0.5MG /DOS, (OZEMPIC, 0.25 OR 0.5 MG/DOSE,) 2 MG/1.5ML SOPN Inject 0.5 mg into the skin once a week.   No facility-administered encounter medications on file as of 09/15/2018.    ALLERGIES: Allergies  Allergen Reactions  . Kiwi Extract Anaphylaxis, Swelling and Palpitations  . Other Anaphylaxis    Walnuts (avoids all tree nuts)   VACCINATION STATUS: There is no immunization history for the selected administration types on file for this patient.  Diabetes  She presents for her follow-up diabetic visit. She has type 2 diabetes mellitus. Her disease course has been worsening (She was diagnosed at approximate age of 44 years.). There are no hypoglycemic associated symptoms. Pertinent negatives for hypoglycemia include no  confusion, pallor or seizures. Associated symptoms include polydipsia and polyuria. Pertinent negatives for diabetes include no polyphagia. There are no hypoglycemic complications. Symptoms are worsening. There are no diabetic complications. Risk factors for coronary artery disease include dyslipidemia, diabetes mellitus, hypertension and sedentary lifestyle. Current diabetic treatment includes insulin injections. She is compliant with treatment most of the time. She is following a generally unhealthy diet. She never participates in exercise. Her home blood glucose trend is decreasing rapidly. Her breakfast  blood glucose range is generally 130-140 mg/dl. Her lunch blood glucose range is generally 140-180 mg/dl. Her dinner blood glucose range is generally 140-180 mg/dl. Her bedtime blood glucose range is generally 140-180 mg/dl. Her overall blood glucose range is 140-180 mg/dl. An ACE inhibitor/angiotensin II receptor blocker is being taken.  Hyperlipidemia  This is a chronic problem. The current episode started more than 1 year ago. The problem is uncontrolled. Exacerbating diseases include diabetes, hypothyroidism and obesity. Pertinent negatives include no myalgias. Current antihyperlipidemic treatment includes statins. Risk factors for coronary artery disease include diabetes mellitus, dyslipidemia, hypertension, obesity, a sedentary lifestyle and post-menopausal.      Objective:    There were no vitals taken for this visit.  Wt Readings from Last 3 Encounters:  05/26/18 240 lb (108.9 kg)  05/13/18 239 lb (108.4 kg)  04/30/18 240 lb (108.9 kg)    Lipid Panel     Component Value Date/Time   CHOL 133 04/19/2017 0759   TRIG 62 04/19/2017 0759   HDL 69 04/19/2017 0759   CHOLHDL 1.9 04/19/2017 0759   VLDL 10 03/07/2016 0705   LDLCALC 50 04/19/2017 0759    Recent Results (from the past 2160 hour(s))  Hemoglobin A1c     Status: Abnormal   Collection Time: 09/11/18  8:07 AM  Result Value Ref  Range   Hgb A1c MFr Bld 8.2 (H) <5.7 % of total Hgb    Comment: For someone without known diabetes, a hemoglobin A1c value of 6.5% or greater indicates that they may have  diabetes and this should be confirmed with a follow-up  test. . For someone with known diabetes, a value <7% indicates  that their diabetes is well controlled and a value  greater than or equal to 7% indicates suboptimal  control. A1c targets should be individualized based on  duration of diabetes, age, comorbid conditions, and  other considerations. . Currently, no consensus exists regarding use of hemoglobin A1c for diagnosis of diabetes for children. .    Mean Plasma Glucose 189 (calc)   eAG (mmol/L) 10.4 (calc)  COMPLETE METABOLIC PANEL WITH GFR     Status: Abnormal   Collection Time: 09/11/18  8:07 AM  Result Value Ref Range   Glucose, Bld 135 (H) 65 - 99 mg/dL    Comment: .            Fasting reference interval . For someone without known diabetes, a glucose value >125 mg/dL indicates that they may have diabetes and this should be confirmed with a follow-up test. .    BUN 14 7 - 25 mg/dL   Creat 0.79 0.50 - 1.05 mg/dL    Comment: For patients >67 years of age, the reference limit for Creatinine is approximately 13% higher for people identified as African-American. .    GFR, Est Non African American 83 > OR = 60 mL/min/1.105m2   GFR, Est African American 96 > OR = 60 mL/min/1.75m2   BUN/Creatinine Ratio NOT APPLICABLE 6 - 22 (calc)   Sodium 138 135 - 146 mmol/L   Potassium 4.2 3.5 - 5.3 mmol/L   Chloride 103 98 - 110 mmol/L   CO2 26 20 - 32 mmol/L   Calcium 9.6 8.6 - 10.4 mg/dL   Total Protein 7.0 6.1 - 8.1 g/dL   Albumin 4.5 3.6 - 5.1 g/dL   Globulin 2.5 1.9 - 3.7 g/dL (calc)   AG Ratio 1.8 1.0 - 2.5 (calc)   Total Bilirubin 0.5 0.2 - 1.2 mg/dL  Alkaline phosphatase (APISO) 64 37 - 153 U/L   AST 27 10 - 35 U/L   ALT 36 (H) 6 - 29 U/L     Assessment & Plan:   1. Uncontrolled type 2  diabetes mellitus with complication, with long-term current use of insulin ([place])  -Her  diabetes is  complicated by history of noncompliance (she recently showed better engagement) and patient remains at  extremely high risk for more acute and chronic complications of diabetes which include CAD, CVA, CKD, retinopathy, and neuropathy. These are all discussed in detail with the patient.  -She returns with persistent and significantly above target blood glucose readings averaging greater than 200 mg/dL.    -Her recent labs show A1c of 8.2%, increasing from 7.9%.  - I have re-counseled the patient on diet management and weight loss  by adopting a carbohydrate restricted / protein rich  Diet.  - Patient admits there is a room for improvement in her diet and drink choices. -  Suggestion is made for her to avoid simple carbohydrates  from her diet including Cakes, Sweet Desserts / Pastries, Ice Cream, Soda (diet and regular), Sweet Tea, Candies, Chips, Cookies, Store Bought Juices, Alcohol in Excess of  1-2 drinks a day, Artificial Sweeteners, and "Sugar-free" Products. This will help patient to have stable blood glucose profile and potentially avoid unintended weight gain.   - Patient is advised to stick to a routine mealtimes to eat 3 meals  a day and avoid unnecessary snacks ( to snack only to correct hypoglycemia).  - I have approached patient with the following individualized plan to manage diabetes and patient agrees.  -She will continue to require intensive treatment with basal/bolus insulin in order for her to achieve and maintain control of diabetes to target. -She is advised to continue Lantus 80 units nightly, decrease NovoLog to 20 units 3 times a day before meals plus correction.  She is advised to continue to use her continuous glucose monitoring device.   - She has tolerated metformin.  She is advised to continue metformin 500 mg ER to twice a day after breakfast and supper.  -I  discussed and increased her Ozempic to 1 mg subcutaneously weekly.    2) Lipids/HPL: Her recent lipid panel showed controlled with LDL of 50.  She is advised to continue simvastatin 10 mg p.o. Nightly.    3) Hypothyroidism:  -Her recent labs are consistent with appropriate thyroid hormone replacement. She is advised to continue levothyroxine 75 mcg p.o. every morning.  - We discussed about the correct intake of her thyroid hormone, on empty stomach at fasting, with water, separated by at least 30 minutes from breakfast and other medications,  and separated by more than 4 hours from calcium, iron, multivitamins, acid reflux medications (PPIs). -Patient is made aware of the fact that thyroid hormone replacement is needed for life, dose to be adjusted by periodic monitoring of thyroid function tests.    - I advised patient to maintain close follow up with Sinda Du, MD for primary care needs.  - Patient Care Time Today:  25 min, of which >50% was spent in reviewing her  current and  previous labs/studies, previous treatments, and medications doses and developing a plan for long-term care based on the latest recommendations for standards of care.  Sandy Valencia participated in the discussions, expressed understanding, and voiced agreement with the above plans.  All questions were answered to her satisfaction. she is encouraged to contact clinic should she  have any questions or concerns prior to her return visit.   Follow up plan: -Return in about 3 months (around 12/15/2018) for Follow up with Pre-visit Labs, Meter, and Logs.  Glade Lloyd, MD Phone: [phone]  Fax: [phone]   -  This note was partially dictated with voice recognition software. Similar sounding words can be transcribed inadequately or may not  be corrected upon review.  09/15/2018, 1:24 PM

## 2018-09-28 ENCOUNTER — Encounter: Payer: Self-pay | Admitting: Orthopedic Surgery

## 2018-10-06 DIAGNOSIS — E119 Type 2 diabetes mellitus without complications: Secondary | ICD-10-CM | POA: Diagnosis not present

## 2018-10-06 DIAGNOSIS — J45909 Unspecified asthma, uncomplicated: Secondary | ICD-10-CM | POA: Diagnosis not present

## 2018-10-06 DIAGNOSIS — I10 Essential (primary) hypertension: Secondary | ICD-10-CM | POA: Diagnosis not present

## 2018-10-06 DIAGNOSIS — M545 Low back pain: Secondary | ICD-10-CM | POA: Diagnosis not present

## 2018-10-15 ENCOUNTER — Other Ambulatory Visit: Payer: Self-pay | Admitting: "Endocrinology

## 2018-10-16 ENCOUNTER — Other Ambulatory Visit: Payer: Self-pay

## 2018-10-16 ENCOUNTER — Ambulatory Visit (INDEPENDENT_AMBULATORY_CARE_PROVIDER_SITE_OTHER): Payer: Medicaid Other | Admitting: Otolaryngology

## 2018-10-16 DIAGNOSIS — H7010 Chronic mastoiditis, unspecified ear: Secondary | ICD-10-CM | POA: Diagnosis not present

## 2018-11-03 ENCOUNTER — Other Ambulatory Visit (HOSPITAL_COMMUNITY): Payer: Self-pay | Admitting: Pulmonary Disease

## 2018-11-03 DIAGNOSIS — Z1231 Encounter for screening mammogram for malignant neoplasm of breast: Secondary | ICD-10-CM

## 2018-11-17 ENCOUNTER — Other Ambulatory Visit: Payer: Self-pay | Admitting: "Endocrinology

## 2018-11-18 ENCOUNTER — Telehealth: Payer: Self-pay

## 2018-11-18 DIAGNOSIS — E1165 Type 2 diabetes mellitus with hyperglycemia: Secondary | ICD-10-CM

## 2018-11-18 DIAGNOSIS — IMO0002 Reserved for concepts with insufficient information to code with codable children: Secondary | ICD-10-CM

## 2018-11-18 MED ORDER — OZEMPIC (1 MG/DOSE) 2 MG/1.5ML ~~LOC~~ SOPN: 1.0000 mg | PEN_INJECTOR | SUBCUTANEOUS | 2 refills | Status: DC

## 2018-11-18 NOTE — Telephone Encounter (Signed)
LeighAnn Rosary Filosa, CMA  

## 2018-12-07 ENCOUNTER — Emergency Department (HOSPITAL_COMMUNITY)
Admission: EM | Admit: 2018-12-07 | Discharge: 2018-12-07 | Disposition: A | Discharge status: Home / Self Care | Payer: Medicaid Other | Attending: Emergency Medicine | Admitting: Emergency Medicine

## 2018-12-07 ENCOUNTER — Encounter (HOSPITAL_COMMUNITY): Payer: Self-pay | Admitting: Emergency Medicine

## 2018-12-07 ENCOUNTER — Emergency Department (HOSPITAL_COMMUNITY): Payer: Medicaid Other

## 2018-12-07 ENCOUNTER — Other Ambulatory Visit: Payer: Self-pay

## 2018-12-07 DIAGNOSIS — J45909 Unspecified asthma, uncomplicated: Secondary | ICD-10-CM | POA: Insufficient documentation

## 2018-12-07 DIAGNOSIS — J040 Acute laryngitis: Secondary | ICD-10-CM | POA: Insufficient documentation

## 2018-12-07 DIAGNOSIS — Z794 Long term (current) use of insulin: Secondary | ICD-10-CM | POA: Diagnosis not present

## 2018-12-07 DIAGNOSIS — E119 Type 2 diabetes mellitus without complications: Secondary | ICD-10-CM | POA: Diagnosis not present

## 2018-12-07 DIAGNOSIS — E039 Hypothyroidism, unspecified: Secondary | ICD-10-CM | POA: Diagnosis not present

## 2018-12-07 DIAGNOSIS — I10 Essential (primary) hypertension: Secondary | ICD-10-CM | POA: Insufficient documentation

## 2018-12-07 DIAGNOSIS — Z7982 Long term (current) use of aspirin: Secondary | ICD-10-CM | POA: Insufficient documentation

## 2018-12-07 DIAGNOSIS — R05 Cough: Secondary | ICD-10-CM | POA: Diagnosis not present

## 2018-12-07 MED ORDER — BENZONATATE 100 MG PO CAPS: 200.0000 mg | ORAL_CAPSULE | Freq: Three times a day (TID) | ORAL | 0 refills | Status: DC | PRN

## 2018-12-07 MED ORDER — BENZONATATE 100 MG PO CAPS
200.0000 mg | ORAL_CAPSULE | Freq: Once | ORAL | Status: AC
  Administered 2018-12-07: 200 mg via ORAL
  Filled 2018-12-07: qty 2

## 2018-12-07 NOTE — ED Triage Notes (Signed)
Pt diagnosed with bronchitis about 8 days ago. Pt completed prescribed steroids and z pack. Pt states she still has a non productive cough and today she became hoarse. Denies SOB or fever. Pt denies COVID exposure.

## 2018-12-07 NOTE — Discharge Instructions (Addendum)
Use the Tessalon Perles to help reduce the frequency of coughing.  Rest your voice as discussed.  Drink plenty of fluids, especially warm drinks such as tea with lemon can be very soothing.  Taking ibuprofen 400 mg which equals 2 tablets every 8 hours can also help reduce vocal cord inflammation.  Follow-up with your primary doctor for recheck if your symptoms persist or worsen.

## 2018-12-08 NOTE — ED Provider Notes (Signed)
[facility] Provider Note   CSN: [id number] Arrival date & time: 12/07/18  1741    History   Chief Complaint Chief Complaint  Patient presents with  . Cough    HPI Sandy Valencia is a 58 y.o. female with a history of asthma, angioedema, DM, GERD, HTN and hypothyroidism presenting with worsening voice change over the past week, waking this am with complete loss of voice until after multiple throat clearing and progression of time, now reports mainly hoarseness of voice.  She denies throat pain or sensation of throat swelling.  She was recently treated by her pcp with a round of zithromax and a prednisone taper for acute bronchitis, finished both medicines 3 day ago but continues to have a persistent which she suspects is irritating her throat.  She denies fevers, chills, chest pain, sob or productive cough.  She did have some wheezing which has resolved since her recent treatment.  She has had no covid exposures reporting has been very self isolated, saw pcp through [facility] last week.     The history is provided by the patient.    Past Medical History:  Diagnosis Date  . Angio-edema   . Arthritis   . Asthma   . Diabetes mellitus   . Fatty liver   . GERD (gastroesophageal reflux disease)   . Hypercholesteremia   . Hypertension   . Hypothyroidism   . Sleep apnea    pt siad, "i had small amount but could not tolerate CPAP and they said it was ok since it was mild.    Patient Active Problem List   Diagnosis Date Noted  . Mild intermittent asthma without complication 61/44/3154  . Gastroesophageal reflux disease 05/21/2018  . Chronic rhinitis 05/21/2018  . Mass of finger, right s/p surgical excision (mucoid cyst) 04/17/18 04/22/2018  . History of colonic polyps 08/26/2017  . Hypothyroidism 04/22/2017  . Mixed hyperlipidemia 12/31/2016  . Mucous cyst of digit of right hand   . Uncontrolled type 2 diabetes mellitus with complication, with long-term current  use of insulin ([place]) 05/18/2015  . Essential hypertension, benign 05/18/2015  . Class 2 severe obesity due to excess calories with serious comorbidity and body mass index (BMI) of 38.0 to 38.9 in adult ([place]) 05/18/2015  . Personal history of noncompliance with medical treatment, presenting hazards to health 05/18/2015  . GERD (gastroesophageal reflux disease) 02/14/2015  . Anemia, iron deficiency 02/14/2015  . Fatty liver 02/14/2015  . Hiatal hernia   . Dysphagia, pharyngoesophageal phase   . Dysphagia 10/11/2014  . Dyspepsia 10/11/2014  . Family history of colon cancer 09/03/2012    Past Surgical History:  Procedure Laterality Date  . ABDOMINAL HYSTERECTOMY    . CHOLECYSTECTOMY    . COLONOSCOPY N/A 09/22/2012   RMR: colonic polyps -removed as described above. tubular adenoma, next TCS 09/2017  . COLONOSCOPY WITH PROPOFOL N/A 09/23/2017   Procedure: COLONOSCOPY WITH PROPOFOL;  Surgeon: Daneil Dolin, MD;  Location: [facility];  Service: Endoscopy;  Laterality: N/A;  7:30am  . CYST EXCISION Left 07/19/2016   Procedure: CYST REMOVAL LEFT RING FINGER;  Surgeon: Carole Civil, MD;  Location: [facility];  Service: Orthopedics;  Laterality: Left;  . CYST REMOVAL LEG Right    foot  . ESOPHAGOGASTRODUODENOSCOPY N/A 10/21/2014   RMR: Small hiatal hernia; otherwise normal EGD status post passage of a Maloney dilator.   Marland Kitchen EXCISION MASS UPPER EXTREMETIES Right 04/17/2018   Procedure: EXCISION MASS UPPER EXTREMETIES right ring  finger;  Surgeon: Carole Civil, MD;  Location: [facility];  Service: Orthopedics;  Laterality: Right;  . MALONEY DILATION N/A 10/21/2014   Procedure: [name] DILATION;  Surgeon: Daneil Dolin, MD;  Location: [facility];  Service: Endoscopy;  Laterality: N/A;  . MIDDLE EAR SURGERY Right    patched hole in ear drum  . POLYPECTOMY  09/23/2017   Procedure: POLYPECTOMY;  Surgeon: Daneil Dolin, MD;  Location: [facility];  Service: Endoscopy;;  polyp hepatic flexure  polyp cs, splenic flexure polyp cs, rectal polyp cs     OB History   No obstetric history on file.      Home Medications    Prior to Admission medications   Medication Sig Start Date End Date Taking? Authorizing Provider  albuterol (PROVENTIL HFA) 108 (90 Base) MCG/ACT inhaler Inhale 2 puffs into the lungs every 6 (six) hours as needed for wheezing or shortness of breath. 02/18/18  Yes Valentina Shaggy, MD  aspirin EC 81 MG tablet Take 81 mg by mouth daily.   Yes [provider]  Cholecalciferol (VITAMIN D-3) 1000 UNITS CAPS Take 1,000 Units by mouth daily.    Yes [provider]  HYDROcodone-acetaminophen (NORCO) 7.5-325 MG tablet Take 1 tablet by mouth every 6 (six) hours as needed for moderate pain. 07/19/16  Yes Carole Civil, MD  insulin aspart (NOVOLOG FLEXPEN) 100 UNIT/ML FlexPen Inject 20-26 Units into the skin 3 (three) times daily with meals. Patient taking differently: Inject 16-20 Units into the skin 3 (three) times daily with meals. Per sliding scale 05/13/18  Yes Nida, Marella Chimes, MD  LANTUS SOLOSTAR 100 UNIT/ML Solostar Pen INJECT 80 TO 100 UNITS IN THE SKIN DAILYAT 10:00AM Patient taking differently: Inject 80 Units into the skin at bedtime.  11/17/18  Yes Nida, Marella Chimes, MD  levothyroxine (SYNTHROID, LEVOTHROID) 75 MCG tablet TAKE ONE TABLET (75MG  TOTAL) BY MOUTH DAILY BEFORE BREAKFAST Patient taking differently: Take 75 mcg by mouth daily before breakfast.  06/17/18  Yes Nida, Marella Chimes, MD  metFORMIN (GLUCOPHAGE) 500 MG tablet Take 500 mg by mouth 2 (two) times daily with a meal.   Yes [provider]  pantoprazole (PROTONIX) 40 MG tablet TAKE ONE (1) TABLET EACH DAY Patient taking differently: Take 40 mg by mouth daily.  04/12/17  Yes Mahala Menghini, PA-C  Semaglutide, 1 MG/DOSE, (OZEMPIC, 1 MG/DOSE,) 2 MG/1.5ML SOPN Inject 1 mg into the skin once a week. Patient taking differently: Inject 0.5 mg into the skin every  Friday.  11/18/18  Yes Nida, Marella Chimes, MD  simvastatin (ZOCOR) 10 MG tablet TAKE ONE TABLET (10MG  TOTAL) BY MOUTH DAILY Patient taking differently: Take 10 mg by mouth daily.  10/15/18  Yes Nida, Marella Chimes, MD  vitamin B-12 (CYANOCOBALAMIN) 500 MCG tablet Take 500 mcg by mouth daily.   Yes [provider]  benzonatate (TESSALON) 100 MG capsule Take 2 capsules (200 mg total) by mouth 3 (three) times daily as needed. 12/07/18   Evalee Jefferson, PA-C    Family History Family History  Problem Relation Age of Onset  . Colon cancer Mother        diagnosed age 71, underwent surgical resection, metastatic disease several years later  . Diabetes Mother   . Heart attack Father   . Diabetes Sister   . Hypothyroidism Brother   . Diabetes Sister   . Diabetes Sister   . Diabetes Sister   . Hypothyroidism Sister     Social History  Social History   Tobacco Use  . Smoking status: Never Smoker  . Smokeless tobacco: Never Used  Substance Use Topics  . Alcohol use: No    Alcohol/week: 0.0 standard drinks  . Drug use: No     Allergies   Kiwi extract and Other   Review of Systems Review of Systems  Constitutional: Negative for chills and fever.  HENT: Positive for voice change. Negative for congestion, facial swelling, postnasal drip, rhinorrhea, sore throat and trouble swallowing.   Eyes: Negative.   Respiratory: Positive for cough. Negative for chest tightness, shortness of breath, wheezing and stridor.   Cardiovascular: Negative for chest pain and palpitations.  Gastrointestinal: Negative for abdominal pain, nausea and vomiting.  Genitourinary: Negative.   Musculoskeletal: Negative for arthralgias and neck pain.  Skin: Negative.  Negative for rash and wound.  Neurological: Negative for dizziness, weakness, light-headedness, numbness and headaches.  Psychiatric/Behavioral: Negative.      Physical Exam Updated Vital Signs BP (!) 166/85   Pulse 69   Temp 98 F (36.7  C) (Oral)   Resp 18   Ht 5\' 3"  (1.6 m)   Wt 106.6 kg   SpO2 96%   BMI 41.63 kg/m   Physical Exam Constitutional:      Appearance: Normal appearance. She is well-developed. She is obese.  HENT:     Head: Normocephalic and atraumatic.     Right Ear: Tympanic membrane and ear canal normal.     Left Ear: Tympanic membrane and ear canal normal.     Nose: No mucosal edema or rhinorrhea.     Mouth/Throat:     Mouth: Mucous membranes are moist.     Pharynx: Oropharynx is clear. Uvula midline. No pharyngeal swelling, oropharyngeal exudate or posterior oropharyngeal erythema.     Tonsils: No tonsillar abscesses.  Eyes:     Conjunctiva/sclera: Conjunctivae normal.  Neck:     Musculoskeletal: Full passive range of motion without pain. No muscular tenderness.     Thyroid: No thyroid mass.     Trachea: No tracheal tenderness.     Comments: Hoarse phonation, raspy. Brief episodes of laryngitis, then clears. No stridor. Cardiovascular:     Rate and Rhythm: Normal rate.     Heart sounds: Normal heart sounds.  Pulmonary:     Effort: Pulmonary effort is normal. No respiratory distress.     Breath sounds: No stridor. No wheezing, rhonchi or rales.  Abdominal:     Palpations: Abdomen is soft.     Tenderness: There is no abdominal tenderness.  Musculoskeletal: Normal range of motion.  Lymphadenopathy:     Cervical: No cervical adenopathy.  Skin:    General: Skin is warm and dry.     Findings: No rash.  Neurological:     Mental Status: She is alert and oriented to person, place, and time.      ED Treatments / Results  Labs (all labs ordered are listed, but only abnormal results are displayed) Labs Reviewed - No data to display  EKG None  Radiology Dg Chest 2 View  Result Date: 12/07/2018 CLINICAL DATA:  58 y/o F; cough and hoarseness. Diagnosed with bronchitis 8 days ago. Completed antibiotics. EXAM: CHEST - 2 VIEW COMPARISON:  01/15/2014 chest radiograph FINDINGS: Stable heart  size and mediastinal contours are within normal limits. No consolidation, effusion, or pneumothorax. The visualized skeletal structures are unremarkable. IMPRESSION: No acute pulmonary process identified. Electronically Signed   By: Kristine Garbe M.D.   On: 12/07/2018 19:05  Procedures Procedures (including critical care time)  Medications Ordered in ED Medications  benzonatate (TESSALON) capsule 200 mg (200 mg Oral Given 12/07/18 2330)     Initial Impression / Assessment and Plan / ED Course  I have reviewed the triage vital signs and the nursing notes.  Pertinent labs & imaging results that were available during my care of the patient were reviewed by me and considered in my medical decision making (see chart for details).        Pt recently treated for acute bronchitis with steroids and zpack. cxr reviewed, normal. H/o c/w vocal cord irritation secondary to cough frequency. She was prescribed tessalon perles for cough. Discussed other home tx including nsaids, voice rest, warm fluids/tea with lemon. She has no respiratory distress, no stidor or wheeze. Return precautions discussed, prn f/u anticipated.  Pt has been afebrile,  No indication for covid testing.  ELETHA CULBERTSON was evaluated in Emergency Department on 12/08/2018 for the symptoms described in the history of present illness. She was evaluated in the context of the global COVID-19 pandemic, which necessitated consideration that the patient might be at risk for infection with the SARS-CoV-2 virus that causes COVID-19. Institutional protocols and algorithms that pertain to the evaluation of patients at risk for COVID-19 are in a state of rapid change based on information released by regulatory bodies including the CDC and federal and state organizations. These policies and algorithms were followed during the patient's care in the ED.   Final Clinical Impressions(s) / ED Diagnoses   Final diagnoses:  Laryngitis     ED Discharge Orders         Ordered    benzonatate (TESSALON) 100 MG capsule  3 times daily PRN     12/07/18 2306           Evalee Jefferson, PA-C 12/08/18 1336    Milton Ferguson, MD 12/10/18 [phone]

## 2018-12-15 ENCOUNTER — Encounter (HOSPITAL_COMMUNITY): Payer: Self-pay

## 2018-12-15 ENCOUNTER — Other Ambulatory Visit: Payer: Self-pay

## 2018-12-15 ENCOUNTER — Ambulatory Visit (HOSPITAL_COMMUNITY)
Admission: RE | Admit: 2018-12-15 | Discharge: 2018-12-15 | Disposition: A | Discharge status: Home / Self Care | Payer: Medicaid Other | Source: Ambulatory Visit | Attending: Pulmonary Disease | Admitting: Pulmonary Disease

## 2018-12-15 DIAGNOSIS — Z1231 Encounter for screening mammogram for malignant neoplasm of breast: Secondary | ICD-10-CM | POA: Insufficient documentation

## 2018-12-15 LAB — HM MAMMOGRAPHY

## 2018-12-16 ENCOUNTER — Other Ambulatory Visit: Payer: Self-pay | Admitting: Allergy & Immunology

## 2018-12-16 ENCOUNTER — Other Ambulatory Visit: Payer: Self-pay | Admitting: "Endocrinology

## 2018-12-18 DIAGNOSIS — E118 Type 2 diabetes mellitus with unspecified complications: Secondary | ICD-10-CM | POA: Diagnosis not present

## 2018-12-18 DIAGNOSIS — Z794 Long term (current) use of insulin: Secondary | ICD-10-CM | POA: Diagnosis not present

## 2018-12-18 DIAGNOSIS — E1165 Type 2 diabetes mellitus with hyperglycemia: Secondary | ICD-10-CM | POA: Diagnosis not present

## 2018-12-18 DIAGNOSIS — E782 Mixed hyperlipidemia: Secondary | ICD-10-CM | POA: Diagnosis not present

## 2018-12-19 LAB — COMPLETE METABOLIC PANEL WITH GFR
AG Ratio: 1.8 (calc) (ref 1.0–2.5)
ALT: 39 U/L — ABNORMAL HIGH (ref 6–29)
AST: 23 U/L (ref 10–35)
Albumin: 4.3 g/dL (ref 3.6–5.1)
Alkaline phosphatase (APISO): 90 U/L (ref 37–153)
BUN: 12 mg/dL (ref 7–25)
CO2: 24 mmol/L (ref 20–32)
Calcium: 9.5 mg/dL (ref 8.6–10.4)
Chloride: 105 mmol/L (ref 98–110)
Creat: 0.71 mg/dL (ref 0.50–1.05)
GFR, Est African American: 110 mL/min/{1.73_m2} (ref >=60)
GFR, Est Non African American: 95 mL/min/{1.73_m2} (ref >=60)
Globulin: 2.4 g/dL (calc) (ref 1.9–3.7)
Glucose, Bld: 159 mg/dL — ABNORMAL HIGH (ref 65–99)
Potassium: 4.2 mmol/L (ref 3.5–5.3)
Sodium: 139 mmol/L (ref 135–146)
Total Bilirubin: 0.4 mg/dL (ref 0.2–1.2)
Total Protein: 6.7 g/dL (ref 6.1–8.1)

## 2018-12-19 LAB — LIPID PANEL
Cholesterol: 107 mg/dL (ref <200)
HDL: 52 mg/dL (ref >=50)
LDL Cholesterol (Calc): 42 mg/dL (calc)
Non-HDL Cholesterol (Calc): 55 mg/dL (calc) (ref <130)
Total CHOL/HDL Ratio: 2.1 (calc) (ref <5.0)
Triglycerides: 45 mg/dL (ref <150)

## 2018-12-19 LAB — HEMOGLOBIN A1C
Hgb A1c MFr Bld: 8.5 % of total Hgb — ABNORMAL HIGH (ref <5.7)
Mean Plasma Glucose: 197 (calc)
eAG (mmol/L): 10.9 (calc)

## 2018-12-23 ENCOUNTER — Other Ambulatory Visit: Payer: Self-pay

## 2018-12-23 DIAGNOSIS — Z20822 Contact with and (suspected) exposure to covid-19: Secondary | ICD-10-CM

## 2018-12-24 ENCOUNTER — Encounter: Payer: Self-pay | Admitting: "Endocrinology

## 2018-12-24 ENCOUNTER — Other Ambulatory Visit: Payer: Self-pay

## 2018-12-24 ENCOUNTER — Ambulatory Visit (INDEPENDENT_AMBULATORY_CARE_PROVIDER_SITE_OTHER): Payer: Medicaid Other | Admitting: "Endocrinology

## 2018-12-24 DIAGNOSIS — Z794 Long term (current) use of insulin: Secondary | ICD-10-CM | POA: Diagnosis not present

## 2018-12-24 DIAGNOSIS — IMO0002 Reserved for concepts with insufficient information to code with codable children: Secondary | ICD-10-CM

## 2018-12-24 DIAGNOSIS — E118 Type 2 diabetes mellitus with unspecified complications: Secondary | ICD-10-CM

## 2018-12-24 DIAGNOSIS — E782 Mixed hyperlipidemia: Secondary | ICD-10-CM

## 2018-12-24 DIAGNOSIS — I10 Essential (primary) hypertension: Secondary | ICD-10-CM | POA: Diagnosis not present

## 2018-12-24 DIAGNOSIS — E039 Hypothyroidism, unspecified: Secondary | ICD-10-CM | POA: Diagnosis not present

## 2018-12-24 DIAGNOSIS — E1165 Type 2 diabetes mellitus with hyperglycemia: Secondary | ICD-10-CM | POA: Diagnosis not present

## 2018-12-24 MED ORDER — NOVOLOG FLEXPEN 100 UNIT/ML ~~LOC~~ SOPN: 25.0000 [IU] | PEN_INJECTOR | Freq: Three times a day (TID) | SUBCUTANEOUS | 2 refills | Status: DC

## 2018-12-24 NOTE — Progress Notes (Signed)
12/24/2018                                                    Endocrinology Telehealth Visit Follow up Note -During COVID -19 Pandemic  This visit type was conducted due to national recommendations for restrictions regarding the COVID-19 Pandemic  in an effort to limit this patient's exposure and mitigate transmission of the corona virus.  Due to her co-morbid illnesses, Sandy Valencia is at  moderate to high risk for complications without adequate follow up.  This format is felt to be most appropriate for her at this time.  I connected with this patient on 12/24/2018   by telephone and verified that I am speaking with the correct person using two identifiers. Sandy Valencia, 1961-04-26. she has verbally consented to this visit. All issues noted in this document were discussed and addressed. The format was not optimal for physical exam.    Subjective:    Patient ID: Sandy Valencia, female    DOB: 1960/12/03, PCP Sinda Du, MD   Past Medical History:  Diagnosis Date  . Angio-edema   . Arthritis   . Asthma   . Diabetes mellitus   . Fatty liver   . GERD (gastroesophageal reflux disease)   . Hypercholesteremia   . Hypertension   . Hypothyroidism   . Sleep apnea    pt siad, "i had small amount but could not tolerate CPAP and they said it was ok since it was mild.   Past Surgical History:  Procedure Laterality Date  . ABDOMINAL HYSTERECTOMY    . CHOLECYSTECTOMY    . COLONOSCOPY N/A 09/22/2012   RMR: colonic polyps -removed as described above. tubular adenoma, next TCS 09/2017  . COLONOSCOPY WITH PROPOFOL N/A 09/23/2017   Procedure: COLONOSCOPY WITH PROPOFOL;  Surgeon: Daneil Dolin, MD;  Location: [facility];  Service: Endoscopy;  Laterality: N/A;  7:30am  . CYST EXCISION Left 07/19/2016   Procedure: CYST REMOVAL LEFT RING FINGER;  Surgeon: Carole Civil, MD;  Location: [facility];  Service: Orthopedics;  Laterality: Left;  . CYST REMOVAL LEG Right    foot  .  ESOPHAGOGASTRODUODENOSCOPY N/A 10/21/2014   RMR: Small hiatal hernia; otherwise normal EGD status post passage of a Maloney dilator.   Marland Kitchen EXCISION MASS UPPER EXTREMETIES Right 04/17/2018   Procedure: EXCISION MASS UPPER EXTREMETIES right ring finger;  Surgeon: Carole Civil, MD;  Location: [facility];  Service: Orthopedics;  Laterality: Right;  . MALONEY DILATION N/A 10/21/2014   Procedure: [name] DILATION;  Surgeon: Daneil Dolin, MD;  Location: [facility];  Service: Endoscopy;  Laterality: N/A;  . MIDDLE EAR SURGERY Right    patched hole in ear drum  . POLYPECTOMY  09/23/2017   Procedure: POLYPECTOMY;  Surgeon: Daneil Dolin, MD;  Location: [facility];  Service: Endoscopy;;  polyp hepatic flexure polyp cs, splenic flexure polyp cs, rectal polyp cs   Social History   Socioeconomic History  . Marital status: Divorced    Spouse name: Not on file  . Number of children: Not on file  . Years of education: Not on file  . Highest education level: Not on file  Occupational History  . Not on file  Social Needs  . Financial resource strain: Not on file  . Food insecurity  Worry: Not on file    Inability: Not on file  . Transportation needs    Medical: Not on file    Non-medical: Not on file  Tobacco Use  . Smoking status: Never Smoker  . Smokeless tobacco: Never Used  Substance and Sexual Activity  . Alcohol use: No    Alcohol/week: 0.0 standard drinks  . Drug use: No  . Sexual activity: Yes    Birth control/protection: Surgical  Lifestyle  . Physical activity    Days per week: Not on file    Minutes per session: Not on file  . Stress: Not on file  Relationships  . Social Herbalist on phone: Not on file    Gets together: Not on file    Attends religious service: Not on file    Active member of club or organization: Not on file    Attends meetings of clubs or organizations: Not on file    Relationship status: Not on file  Other Topics Concern  . Not on  file  Social History Narrative  . Not on file   Outpatient Encounter Medications as of 12/24/2018  Medication Sig  . aspirin EC 81 MG tablet Take 81 mg by mouth daily.  . benzonatate (TESSALON) 100 MG capsule Take 2 capsules (200 mg total) by mouth 3 (three) times daily as needed.  . Cholecalciferol (VITAMIN D-3) 1000 UNITS CAPS Take 1,000 Units by mouth daily.   Marland Kitchen HYDROcodone-acetaminophen (NORCO) 7.5-325 MG tablet Take 1 tablet by mouth every 6 (six) hours as needed for moderate pain.  Marland Kitchen insulin aspart (NOVOLOG FLEXPEN) 100 UNIT/ML FlexPen Inject 25-31 Units into the skin 3 (three) times daily with meals.  Marland Kitchen LANTUS SOLOSTAR 100 UNIT/ML Solostar Pen INJECT 80 TO 100 UNITS IN THE SKIN DAILYAT 10:00AM (Patient taking differently: Inject 80 Units into the skin at bedtime. )  . levothyroxine (SYNTHROID) 75 MCG tablet Take 1 tablet (75 mcg total) by mouth daily before breakfast.  . metFORMIN (GLUCOPHAGE) 500 MG tablet Take 500 mg by mouth 2 (two) times daily with a meal.  . pantoprazole (PROTONIX) 40 MG tablet TAKE ONE (1) TABLET EACH DAY (Patient taking differently: Take 40 mg by mouth daily. )  . PROVENTIL HFA 108 (90 Base) MCG/ACT inhaler INHALE TWO PUFFS INTO THE LUNGS EVERY SIX HOURS AS NEEDED FOR WHEEZING OR SHORTNESS OF BREATH  . Semaglutide, 1 MG/DOSE, (OZEMPIC, 1 MG/DOSE,) 2 MG/1.5ML SOPN Inject 1 mg into the skin once a week. (Patient taking differently: Inject 0.5 mg into the skin every Friday. )  . simvastatin (ZOCOR) 10 MG tablet TAKE ONE TABLET (10MG  TOTAL) BY MOUTH DAILY (Patient taking differently: Take 10 mg by mouth daily. )  . vitamin B-12 (CYANOCOBALAMIN) 500 MCG tablet Take 500 mcg by mouth daily.  . [DISCONTINUED] insulin aspart (NOVOLOG FLEXPEN) 100 UNIT/ML FlexPen Inject 20-26 Units into the skin 3 (three) times daily with meals. (Patient taking differently: Inject 16-20 Units into the skin 3 (three) times daily with meals. Per sliding scale)   No facility-administered  encounter medications on file as of 12/24/2018.    ALLERGIES: Allergies  Allergen Reactions  . Kiwi Extract Anaphylaxis, Swelling and Palpitations  . Other Anaphylaxis    Walnuts (avoids all tree nuts)   VACCINATION STATUS: There is no immunization history for the selected administration types on file for this patient.  Diabetes She presents for her follow-up diabetic visit. She has type 2 diabetes mellitus. Her disease course has been worsening (She  was diagnosed at approximate age of 63 years.). There are no hypoglycemic associated symptoms. Pertinent negatives for hypoglycemia include no confusion, pallor or seizures. Associated symptoms include polydipsia and polyuria. Pertinent negatives for diabetes include no polyphagia. There are no hypoglycemic complications. Symptoms are worsening. There are no diabetic complications. Risk factors for coronary artery disease include dyslipidemia, diabetes mellitus, hypertension and sedentary lifestyle. Current diabetic treatment includes insulin injections. She is compliant with treatment most of the time. Her weight is fluctuating minimally. She is following a generally unhealthy diet. She never participates in exercise. Her home blood glucose trend is decreasing rapidly. Her breakfast blood glucose range is generally 140-180 mg/dl. Her lunch blood glucose range is generally 180-200 mg/dl. Her dinner blood glucose range is generally 140-180 mg/dl. Her bedtime blood glucose range is generally 180-200 mg/dl. Her overall blood glucose range is 180-200 mg/dl. An ACE inhibitor/angiotensin II receptor blocker is being taken.  Hyperlipidemia This is a chronic problem. The current episode started more than 1 year ago. The problem is uncontrolled. Exacerbating diseases include diabetes, hypothyroidism and obesity. Pertinent negatives include no myalgias. Current antihyperlipidemic treatment includes statins. Risk factors for coronary artery disease include diabetes  mellitus, dyslipidemia, hypertension, obesity, a sedentary lifestyle and post-menopausal.    Objective:    There were no vitals taken for this visit.  Wt Readings from Last 3 Encounters:  12/07/18 235 lb (106.6 kg)  05/26/18 240 lb (108.9 kg)  05/13/18 239 lb (108.4 kg)    Lipid Panel     Component Value Date/Time   CHOL 107 12/18/2018 0710   TRIG 45 12/18/2018 0710   HDL 52 12/18/2018 0710   CHOLHDL 2.1 12/18/2018 0710   VLDL 10 03/07/2016 0705   LDLCALC 42 12/18/2018 0710    Recent Results (from the past 2160 hour(s))  Hemoglobin A1c     Status: Abnormal   Collection Time: 12/18/18  7:10 AM  Result Value Ref Range   Hgb A1c MFr Bld 8.5 (H) <5.7 % of total Hgb    Comment: For someone without known diabetes, a hemoglobin A1c value of 6.5% or greater indicates that they may have  diabetes and this should be confirmed with a follow-up  test. . For someone with known diabetes, a value <7% indicates  that their diabetes is well controlled and a value  greater than or equal to 7% indicates suboptimal  control. A1c targets should be individualized based on  duration of diabetes, age, comorbid conditions, and  other considerations. . Currently, no consensus exists regarding use of hemoglobin A1c for diagnosis of diabetes for children. .    Mean Plasma Glucose 197 (calc)   eAG (mmol/L) 10.9 (calc)  COMPLETE METABOLIC PANEL WITH GFR     Status: Abnormal   Collection Time: 12/18/18  7:10 AM  Result Value Ref Range   Glucose, Bld 159 (H) 65 - 99 mg/dL    Comment: .            Fasting reference interval . For someone without known diabetes, a glucose value >125 mg/dL indicates that they may have diabetes and this should be confirmed with a follow-up test. .    BUN 12 7 - 25 mg/dL   Creat 0.71 0.50 - 1.05 mg/dL    Comment: For patients >60 years of age, the reference limit for Creatinine is approximately 13% higher for people identified as African-American. .     GFR, Est Non African American 95 > OR = 60 mL/min/1.36m2   GFR,  Est African American 110 > OR = 60 mL/min/1.80m2   BUN/Creatinine Ratio NOT APPLICABLE 6 - 22 (calc)   Sodium 139 135 - 146 mmol/L   Potassium 4.2 3.5 - 5.3 mmol/L   Chloride 105 98 - 110 mmol/L   CO2 24 20 - 32 mmol/L   Calcium 9.5 8.6 - 10.4 mg/dL   Total Protein 6.7 6.1 - 8.1 g/dL   Albumin 4.3 3.6 - 5.1 g/dL   Globulin 2.4 1.9 - 3.7 g/dL (calc)   AG Ratio 1.8 1.0 - 2.5 (calc)   Total Bilirubin 0.4 0.2 - 1.2 mg/dL   Alkaline phosphatase (APISO) 90 37 - 153 U/L   AST 23 10 - 35 U/L   ALT 39 (H) 6 - 29 U/L  Lipid panel     Status: None   Collection Time: 12/18/18  7:10 AM  Result Value Ref Range   Cholesterol 107 <200 mg/dL   HDL 52 > OR = 50 mg/dL   Triglycerides 45 <150 mg/dL   LDL Cholesterol (Calc) 42 mg/dL (calc)    Comment: Reference range: <100 . Desirable range <100 mg/dL for primary prevention;   <70 mg/dL for patients with CHD or diabetic patients  with > or = 2 CHD risk factors. Marland Kitchen LDL-C is now calculated using the Martin-Hopkins  calculation, which is a validated novel method providing  better accuracy than the Friedewald equation in the  estimation of LDL-C.  Cresenciano Genre et al. Annamaria Helling. 9371;696(78): 2061-2068  (http://education.[facility].com/faq/FAQ164)    Total CHOL/HDL Ratio 2.1 <5.0 (calc)   Non-HDL Cholesterol (Calc) 55 <130 mg/dL (calc)    Comment: For patients with diabetes plus 1 major ASCVD risk  factor, treating to a non-HDL-C goal of <100 mg/dL  (LDL-C of <70 mg/dL) is considered a therapeutic  option.      Assessment & Plan:   1. Uncontrolled type 2 diabetes mellitus with complication, with long-term current use of insulin ([place])  -Her  diabetes is  complicated by history of noncompliance (she recently showed better engagement) and patient remains at  extremely high risk for more acute and chronic complications of diabetes which include CAD, CVA, CKD, retinopathy, and  neuropathy. These are all discussed in detail with the patient.  -She reports significantly above target glycemic profile particularly postprandial.   Her previsit labs show A1c of 8.5%, progressively increasing from 7.9%.    - I have re-counseled the patient on diet management and weight loss  by adopting a carbohydrate restricted / protein rich  Diet.  - she  admits there is a room for improvement in her diet and drink choices. -  Suggestion is made for her to avoid simple carbohydrates  from her diet including Cakes, Sweet Desserts / Pastries, Ice Cream, Soda (diet and regular), Sweet Tea, Candies, Chips, Cookies, Sweet Pastries,  Store Bought Juices, Alcohol in Excess of  1-2 drinks a day, Artificial Sweeteners, Coffee Creamer, and "Sugar-free" Products. This will help patient to have stable blood glucose profile and potentially avoid unintended weight gain.   - Patient is advised to stick to a routine mealtimes to eat 3 meals  a day and avoid unnecessary snacks ( to snack only to correct hypoglycemia).  - I have approached patient with the following individualized plan to manage diabetes and patient agrees.  -She will continue to require intensive treatment with basal/bolus insulin in order for her to achieve and maintain control of diabetes to target. -She is advised to continue Lantus 80 units nightly, increase  NovoLog to 25  units 3 times a day before meals plus correction.  She is advised to continue to use her continuous glucose monitoring device at all times. - She has tolerated metformin.  She is advised to continue metformin  500 mg ER to twice a day after breakfast and supper.  -He is advised to continue  her Ozempic to 1 mg subcutaneously weekly.    2) Lipids/HPL: Her recent lipid panel showed controlled with LDL of 50.  She is advised to continue simvastatin 10 mg p.o. nightly.  Side effects and precautions discussed with her.    3) Hypothyroidism:  -Her recent labs are  consistent with appropriate thyroid hormone replacement. She is advised to continue levothyroxine 75 mcg p.o. every morning.   - We discussed about the correct intake of her thyroid hormone, on empty stomach at fasting, with water, separated by at least 30 minutes from breakfast and other medications,  and separated by more than 4 hours from calcium, iron, multivitamins, acid reflux medications (PPIs). -Patient is made aware of the fact that thyroid hormone replacement is needed for life, dose to be adjusted by periodic monitoring of thyroid function tests.   - I advised patient to maintain close follow up with Sinda Du, MD for primary care needs.  - Patient Care Time Today:  25 min, of which >50% was spent in reviewing her  current and  previous labs/studies, previous treatments, and medications doses and developing a plan for long-term care based on the latest recommendations for standards of care.  Sandy Valencia participated in the discussions, expressed understanding, and voiced agreement with the above plans.  All questions were answered to her satisfaction. she is encouraged to contact clinic should she have any questions or concerns prior to her return visit.   Follow up plan: -Return in about 4 months (around 04/26/2019), or logs-8, for Follow up with Pre-visit Labs, Meter, and Logs.  Glade Lloyd, MD Phone: [phone]  Fax: [phone]   -  This note was partially dictated with voice recognition software. Similar sounding words can be transcribed inadequately or may not  be corrected upon review.  12/24/2018, 11:32 AM

## 2018-12-25 DIAGNOSIS — F419 Anxiety disorder, unspecified: Secondary | ICD-10-CM | POA: Diagnosis not present

## 2018-12-25 DIAGNOSIS — E119 Type 2 diabetes mellitus without complications: Secondary | ICD-10-CM | POA: Diagnosis not present

## 2018-12-25 DIAGNOSIS — J45909 Unspecified asthma, uncomplicated: Secondary | ICD-10-CM | POA: Diagnosis not present

## 2018-12-25 DIAGNOSIS — U071 COVID-19: Secondary | ICD-10-CM | POA: Diagnosis not present

## 2018-12-25 LAB — NOVEL CORONAVIRUS, NAA: SARS-CoV-2, NAA: DETECTED — AB

## 2018-12-26 DIAGNOSIS — U071 COVID-19: Secondary | ICD-10-CM | POA: Diagnosis not present

## 2018-12-26 DIAGNOSIS — E1165 Type 2 diabetes mellitus with hyperglycemia: Secondary | ICD-10-CM | POA: Diagnosis not present

## 2018-12-26 DIAGNOSIS — E669 Obesity, unspecified: Secondary | ICD-10-CM | POA: Diagnosis not present

## 2018-12-26 DIAGNOSIS — J45909 Unspecified asthma, uncomplicated: Secondary | ICD-10-CM | POA: Diagnosis not present

## 2018-12-29 ENCOUNTER — Telehealth: Payer: Self-pay

## 2018-12-29 DIAGNOSIS — U071 COVID-19: Secondary | ICD-10-CM | POA: Diagnosis not present

## 2018-12-29 DIAGNOSIS — E1165 Type 2 diabetes mellitus with hyperglycemia: Secondary | ICD-10-CM | POA: Diagnosis not present

## 2018-12-29 DIAGNOSIS — IMO0002 Reserved for concepts with insufficient information to code with codable children: Secondary | ICD-10-CM

## 2018-12-29 DIAGNOSIS — J45909 Unspecified asthma, uncomplicated: Secondary | ICD-10-CM | POA: Diagnosis not present

## 2018-12-29 MED ORDER — OZEMPIC (1 MG/DOSE) 2 MG/1.5ML ~~LOC~~ SOPN: 0.5000 mg | PEN_INJECTOR | SUBCUTANEOUS | 2 refills | Status: DC

## 2018-12-29 NOTE — Telephone Encounter (Signed)
Sandy Valencia, CMA  

## 2018-12-30 ENCOUNTER — Other Ambulatory Visit: Payer: Self-pay

## 2018-12-30 MED ORDER — SEMAGLUTIDE(0.25 OR 0.5MG/DOS) 2 MG/1.5ML ~~LOC~~ SOPN: 0.5000 mg | PEN_INJECTOR | SUBCUTANEOUS | 2 refills | Status: DC

## 2019-01-06 DIAGNOSIS — J45909 Unspecified asthma, uncomplicated: Secondary | ICD-10-CM | POA: Diagnosis not present

## 2019-01-06 DIAGNOSIS — E119 Type 2 diabetes mellitus without complications: Secondary | ICD-10-CM | POA: Diagnosis not present

## 2019-01-06 DIAGNOSIS — I10 Essential (primary) hypertension: Secondary | ICD-10-CM | POA: Diagnosis not present

## 2019-01-06 DIAGNOSIS — U071 COVID-19: Secondary | ICD-10-CM | POA: Diagnosis not present

## 2019-01-17 ENCOUNTER — Other Ambulatory Visit: Payer: Self-pay | Admitting: "Endocrinology

## 2019-01-31 ENCOUNTER — Emergency Department (HOSPITAL_COMMUNITY)
Admission: EM | Admit: 2019-01-31 | Discharge: 2019-01-31 | Disposition: A | Discharge status: Home / Self Care | Payer: Medicaid Other | Attending: Emergency Medicine | Admitting: Emergency Medicine

## 2019-01-31 ENCOUNTER — Other Ambulatory Visit: Payer: Self-pay

## 2019-01-31 ENCOUNTER — Emergency Department (HOSPITAL_COMMUNITY): Payer: Medicaid Other

## 2019-01-31 ENCOUNTER — Encounter (HOSPITAL_COMMUNITY): Payer: Self-pay | Admitting: Emergency Medicine

## 2019-01-31 DIAGNOSIS — E119 Type 2 diabetes mellitus without complications: Secondary | ICD-10-CM | POA: Diagnosis not present

## 2019-01-31 DIAGNOSIS — E039 Hypothyroidism, unspecified: Secondary | ICD-10-CM | POA: Insufficient documentation

## 2019-01-31 DIAGNOSIS — M79671 Pain in right foot: Secondary | ICD-10-CM | POA: Insufficient documentation

## 2019-01-31 DIAGNOSIS — Z794 Long term (current) use of insulin: Secondary | ICD-10-CM | POA: Diagnosis not present

## 2019-01-31 DIAGNOSIS — I10 Essential (primary) hypertension: Secondary | ICD-10-CM | POA: Insufficient documentation

## 2019-01-31 DIAGNOSIS — Z79899 Other long term (current) drug therapy: Secondary | ICD-10-CM | POA: Insufficient documentation

## 2019-01-31 DIAGNOSIS — Z7982 Long term (current) use of aspirin: Secondary | ICD-10-CM | POA: Insufficient documentation

## 2019-01-31 MED ORDER — DICLOFENAC SODIUM 50 MG PO TBEC: 50.0000 mg | DELAYED_RELEASE_TABLET | Freq: Two times a day (BID) | ORAL | 0 refills | Status: DC

## 2019-01-31 MED ORDER — KETOROLAC TROMETHAMINE 60 MG/2ML IM SOLN
60.0000 mg | Freq: Once | INTRAMUSCULAR | Status: AC
  Administered 2019-01-31: 19:00:00 60 mg via INTRAMUSCULAR
  Filled 2019-01-31: qty 2

## 2019-01-31 NOTE — Discharge Instructions (Addendum)
X-ray showed no obvious problems in the area where you are hurting.  You did likely react your baby toe about a month ago.  Elevate, ice, anti-inflammatory medication.

## 2019-01-31 NOTE — ED Triage Notes (Addendum)
Patient c/o right foot pain that started last night with an ache but has progressively gotten worse. Denies any injury. Patient states can't bear weight on foot. Denies any swelling. Per patient pain only with standing. Radial pulse present. Color, temp, and capillary refill WNL. CNS intact.

## 2019-01-31 NOTE — ED Notes (Signed)
Pt with DM, re[ports foot pain to arch of foot  Denies injury but reports painful ambulation   Here for eval

## 2019-01-31 NOTE — ED Notes (Signed)
Rad completed

## 2019-01-31 NOTE — ED Provider Notes (Addendum)
[facility] EMERGENCY DEPARTMENT Provider Note   CSN: [id number] Arrival date & time: 01/31/19  1729     History   Chief Complaint Chief Complaint  Patient presents with  . Foot Pain    HPI Sandy Valencia is a 58 y.o. female.     Pain medial aspect of right foot since last night.  No trauma.  Has never happened before.  No arthritic conditions.  No swelling, redness, fever, chills.     Past Medical History:  Diagnosis Date  . Angio-edema   . Arthritis   . Asthma   . Diabetes mellitus   . Fatty liver   . GERD (gastroesophageal reflux disease)   . Hypercholesteremia   . Hypertension   . Hypothyroidism   . Sleep apnea    pt siad, "i had small amount but could not tolerate CPAP and they said it was ok since it was mild.    Patient Active Problem List   Diagnosis Date Noted  . Mild intermittent asthma without complication 123456  . Gastroesophageal reflux disease 05/21/2018  . Chronic rhinitis 05/21/2018  . Mass of finger, right s/p surgical excision (mucoid cyst) 04/17/18 04/22/2018  . History of colonic polyps 08/26/2017  . Hypothyroidism 04/22/2017  . Mixed hyperlipidemia 12/31/2016  . Mucous cyst of digit of right hand   . Uncontrolled type 2 diabetes mellitus with complication, with long-term current use of insulin ([place]) 05/18/2015  . Essential hypertension, benign 05/18/2015  . Class 2 severe obesity due to excess calories with serious comorbidity and body mass index (BMI) of 38.0 to 38.9 in adult ([place]) 05/18/2015  . Personal history of noncompliance with medical treatment, presenting hazards to health 05/18/2015  . GERD (gastroesophageal reflux disease) 02/14/2015  . Anemia, iron deficiency 02/14/2015  . Fatty liver 02/14/2015  . Hiatal hernia   . Dysphagia, pharyngoesophageal phase   . Dysphagia 10/11/2014  . Dyspepsia 10/11/2014  . Family history of colon cancer 09/03/2012    Past Surgical History:  Procedure Laterality Date  . ABDOMINAL  HYSTERECTOMY    . CHOLECYSTECTOMY    . COLONOSCOPY N/A 09/22/2012   RMR: colonic polyps -removed as described above. tubular adenoma, next TCS 09/2017  . COLONOSCOPY WITH PROPOFOL N/A 09/23/2017   Procedure: COLONOSCOPY WITH PROPOFOL;  Surgeon: Daneil Dolin, MD;  Location: [facility] END[facility] SU[facility];  Service: Endoscopy;  Laterality: N/A;  7:30am  . CYST EXCISION Left 07/19/2016   Procedure: CYST REMOVAL LEFT RING FINGER;  Surgeon: Carole Civil, MD;  Location: [facility] ORS;  Service: Orthopedics;  Laterality: Left;  . CYST REMOVAL LEG Right    foot  . ESOPHAGOGASTRODUODENOSCOPY N/A 10/21/2014   RMR: Small hiatal hernia; otherwise normal EGD status post passage of a Maloney dilator.   Marland Kitchen EXCISION MASS UPPER EXTREMETIES Right 04/17/2018   Procedure: EXCISION MASS UPPER EXTREMETIES right ring finger;  Surgeon: Carole Civil, MD;  Location: [facility] ORS;  Service: Orthopedics;  Laterality: Right;  . MALONEY DILATION N/A 10/21/2014   Procedure: [name] DILATION;  Surgeon: Daneil Dolin, MD;  Location: [facility] SUITE;  Service: Endoscopy;  Laterality: N/A;  . MIDDLE EAR SURGERY Right    patched hole in ear drum  . POLYPECTOMY  09/23/2017   Procedure: POLYPECTOMY;  Surgeon: Daneil Dolin, MD;  Location: [facility];  Service: Endoscopy;;  polyp hepatic flexure polyp cs, splenic flexure polyp cs, rectal polyp cs     OB History   No obstetric history on file.  Home Medications    Prior to Admission medications   Medication Sig Start Date End Date Taking? Authorizing Provider  aspirin EC 81 MG tablet Take 81 mg by mouth daily.    [provider]  benzonatate (TESSALON) 100 MG capsule Take 2 capsules (200 mg total) by mouth 3 (three) times daily as needed. 12/07/18   Evalee Jefferson, PA-C  Cholecalciferol (VITAMIN D-3) 1000 UNITS CAPS Take 1,000 Units by mouth daily.     [provider]  diclofenac (VOLTAREN) 50 MG EC tablet Take 1 tablet (50 mg total) by mouth 2 (two) times daily. 01/31/19    Nat Christen, MD  HYDROcodone-acetaminophen (NORCO) 7.5-325 MG tablet Take 1 tablet by mouth every 6 (six) hours as needed for moderate pain. 07/19/16   Carole Civil, MD  insulin aspart (NOVOLOG FLEXPEN) 100 UNIT/ML FlexPen Inject 25-31 Units into the skin 3 (three) times daily with meals. 01/19/19   Cassandria Anger, MD  LANTUS SOLOSTAR 100 UNIT/ML Solostar Pen INJECT 80 TO 100 UNITS IN THE SKIN DAILYAT 10:00AM Patient taking differently: Inject 80 Units into the skin at bedtime.  11/17/18   Cassandria Anger, MD  levothyroxine (SYNTHROID) 75 MCG tablet TAKE ONE TABLET BY MOUTH DAILY BEFORE BREAKFAST 01/19/19   Cassandria Anger, MD  metFORMIN (GLUCOPHAGE) 500 MG tablet Take 500 mg by mouth 2 (two) times daily with a meal.    [provider]  metFORMIN (GLUCOPHAGE-XR) 500 MG 24 hr tablet TAKE ONE TABLET (500MG  TOTAL) BY MOUTH TWO TIMES DAILY AFTER A MEAL 01/19/19   Nida, Marella Chimes, MD  pantoprazole (PROTONIX) 40 MG tablet TAKE ONE (1) TABLET EACH DAY Patient taking differently: Take 40 mg by mouth daily.  04/12/17   Mahala Menghini, PA-C  PROVENTIL HFA 108 [phone] Base) MCG/ACT inhaler INHALE TWO PUFFS INTO THE LUNGS EVERY SIX HOURS AS NEEDED FOR WHEEZING OR SHORTNESS OF BREATH 12/16/18   Valentina Shaggy, MD  Semaglutide,0.25 or 0.5MG /DOS, 2 MG/1.5ML SOPN Inject 0.5 mg into the skin once a week. 12/30/18   Cassandria Anger, MD  simvastatin (ZOCOR) 10 MG tablet TAKE ONE TABLET (10MG  TOTAL) BY MOUTH DAILY Patient taking differently: Take 10 mg by mouth daily.  10/15/18   Cassandria Anger, MD  vitamin B-12 (CYANOCOBALAMIN) 500 MCG tablet Take 500 mcg by mouth daily.    [provider]    Family History Family History  Problem Relation Age of Onset  . Colon cancer Mother        diagnosed age 27, underwent surgical resection, metastatic disease several years later  . Diabetes Mother   . Heart attack Father   . Diabetes Sister   . Hypothyroidism  Brother   . Diabetes Sister   . Diabetes Sister   . Diabetes Sister   . Hypothyroidism Sister     Social History Social History   Tobacco Use  . Smoking status: Never Smoker  . Smokeless tobacco: Never Used  Substance Use Topics  . Alcohol use: No    Alcohol/week: 0.0 standard drinks  . Drug use: No     Allergies   Kiwi extract and Other   Review of Systems Review of Systems  All other systems reviewed and are negative.    Physical Exam Updated Vital Signs BP (!) 165/95 (BP Location: Right Arm)   Pulse 85   Temp 98.2 F (36.8 C) (Oral)   Ht 5\' 5"  (1.651 m)   Wt 104.3 kg   SpO2 98%  BMI 38.27 kg/m   Physical Exam Vitals signs and nursing note reviewed.  Constitutional:      Appearance: She is well-developed.  HENT:     Head: Normocephalic and atraumatic.  Eyes:     Conjunctiva/sclera: Conjunctivae normal.  Neck:     Musculoskeletal: Neck supple.  Musculoskeletal: Normal range of motion.     Comments: Right foot: Tender medial aspect of foot.  No swelling or erythema.  Neurovascular intact.  Skin:    General: Skin is warm and dry.  Neurological:     Mental Status: She is alert and oriented to person, place, and time.  Psychiatric:        Behavior: Behavior normal.      ED Treatments / Results  Labs (all labs ordered are listed, but only abnormal results are displayed) Labs Reviewed - No data to display  EKG None  Radiology Dg Foot Complete Right  Result Date: 01/31/2019 CLINICAL DATA:  Right foot pain EXAM: RIGHT FOOT COMPLETE - 3+ VIEW COMPARISON:  None. FINDINGS: There is in age-indeterminate deformity of the proximal phalanx of the fifth digit. There may be some mild surrounding soft tissue swelling. There is no radiopaque foreign body. There is no definite acute displaced fracture or dislocation. There is a moderate-sized plantar calcaneal spur. There is an Achilles tendon enthesophyte. There are degenerative changes of the midfoot.  IMPRESSION: Age-indeterminate fracture deformity of the proximal phalanx of the fifth digit. Correlation with point tenderness is recommended. Electronically Signed   By: Constance Holster M.D.   On: 01/31/2019 18:50    Procedures Procedures (including critical care time)  Medications Ordered in ED Medications  ketorolac (TORADOL) injection 60 mg (has no administration in time range)     Initial Impression / Assessment and Plan / ED Course  I have reviewed the triage vital signs and the nursing notes.  Pertinent labs & imaging results that were available during my care of the patient were reviewed by me and considered in my medical decision making (see chart for details).      X-ray reveals an age-indeterminate fracture of the proximal phalanx of the fifth digit.  She is not hurting in this area.  No obvious etiology for the pain.  Will Rx Toradol 60 mg IM.  Discharge medication Voltaren 50 mg twice a day    Final Clinical Impressions(s) / ED Diagnoses   Final diagnoses:  Right foot pain    ED Discharge Orders         Ordered    diclofenac (VOLTAREN) 50 MG EC tablet  2 times daily     01/31/19 1919           Nat Christen, MD 01/31/19 1815    Nat Christen, MD 01/31/19 Lurena Nida

## 2019-02-06 DIAGNOSIS — U071 COVID-19: Secondary | ICD-10-CM | POA: Diagnosis not present

## 2019-02-06 DIAGNOSIS — M545 Low back pain: Secondary | ICD-10-CM | POA: Diagnosis not present

## 2019-02-06 DIAGNOSIS — J45909 Unspecified asthma, uncomplicated: Secondary | ICD-10-CM | POA: Diagnosis not present

## 2019-02-06 DIAGNOSIS — E119 Type 2 diabetes mellitus without complications: Secondary | ICD-10-CM | POA: Diagnosis not present

## 2019-02-10 DIAGNOSIS — Z79891 Long term (current) use of opiate analgesic: Secondary | ICD-10-CM | POA: Diagnosis not present

## 2019-02-18 ENCOUNTER — Other Ambulatory Visit: Payer: Self-pay | Admitting: "Endocrinology

## 2019-02-19 ENCOUNTER — Ambulatory Visit (INDEPENDENT_AMBULATORY_CARE_PROVIDER_SITE_OTHER): Payer: Medicaid Other | Admitting: Otolaryngology

## 2019-02-19 DIAGNOSIS — H7011 Chronic mastoiditis, right ear: Secondary | ICD-10-CM | POA: Diagnosis not present

## 2019-04-21 ENCOUNTER — Other Ambulatory Visit: Payer: Self-pay | Admitting: Allergy & Immunology

## 2019-04-21 ENCOUNTER — Other Ambulatory Visit: Payer: Self-pay | Admitting: "Endocrinology

## 2019-04-21 DIAGNOSIS — M545 Low back pain: Secondary | ICD-10-CM | POA: Diagnosis not present

## 2019-04-21 DIAGNOSIS — G894 Chronic pain syndrome: Secondary | ICD-10-CM | POA: Diagnosis not present

## 2019-04-21 DIAGNOSIS — M542 Cervicalgia: Secondary | ICD-10-CM | POA: Diagnosis not present

## 2019-04-22 DIAGNOSIS — E118 Type 2 diabetes mellitus with unspecified complications: Secondary | ICD-10-CM | POA: Diagnosis not present

## 2019-04-22 DIAGNOSIS — E1165 Type 2 diabetes mellitus with hyperglycemia: Secondary | ICD-10-CM | POA: Diagnosis not present

## 2019-04-22 DIAGNOSIS — E039 Hypothyroidism, unspecified: Secondary | ICD-10-CM | POA: Diagnosis not present

## 2019-04-22 DIAGNOSIS — Z794 Long term (current) use of insulin: Secondary | ICD-10-CM | POA: Diagnosis not present

## 2019-04-23 LAB — COMPLETE METABOLIC PANEL WITH GFR
AG Ratio: 1.9 (calc) (ref 1.0–2.5)
ALT: 52 U/L — ABNORMAL HIGH (ref 6–29)
AST: 34 U/L (ref 10–35)
Albumin: 4.7 g/dL (ref 3.6–5.1)
Alkaline phosphatase (APISO): 59 U/L (ref 37–153)
BUN: 15 mg/dL (ref 7–25)
CO2: 28 mmol/L (ref 20–32)
Calcium: 10 mg/dL (ref 8.6–10.4)
Chloride: 100 mmol/L (ref 98–110)
Creat: 0.74 mg/dL (ref 0.50–1.05)
GFR, Est African American: 104 mL/min/{1.73_m2} (ref >=60)
GFR, Est Non African American: 90 mL/min/{1.73_m2} (ref >=60)
Globulin: 2.5 g/dL (calc) (ref 1.9–3.7)
Glucose, Bld: 164 mg/dL — ABNORMAL HIGH (ref 65–99)
Potassium: 4.3 mmol/L (ref 3.5–5.3)
Sodium: 138 mmol/L (ref 135–146)
Total Bilirubin: 0.5 mg/dL (ref 0.2–1.2)
Total Protein: 7.2 g/dL (ref 6.1–8.1)

## 2019-04-23 LAB — HEMOGLOBIN A1C
Hgb A1c MFr Bld: 9.6 % of total Hgb — ABNORMAL HIGH (ref <5.7)
Mean Plasma Glucose: 229 (calc)
eAG (mmol/L): 12.7 (calc)

## 2019-04-23 LAB — T4, FREE: Free T4: 1.3 ng/dL (ref 0.8–1.8)

## 2019-04-23 LAB — TSH: TSH: 1.12 mIU/L (ref 0.40–4.50)

## 2019-04-28 ENCOUNTER — Ambulatory Visit (INDEPENDENT_AMBULATORY_CARE_PROVIDER_SITE_OTHER): Payer: Medicaid Other | Admitting: "Endocrinology

## 2019-04-28 ENCOUNTER — Other Ambulatory Visit: Payer: Self-pay

## 2019-04-28 ENCOUNTER — Encounter (INDEPENDENT_AMBULATORY_CARE_PROVIDER_SITE_OTHER): Payer: Self-pay

## 2019-04-28 ENCOUNTER — Encounter: Payer: Self-pay | Admitting: "Endocrinology

## 2019-04-28 DIAGNOSIS — E039 Hypothyroidism, unspecified: Secondary | ICD-10-CM | POA: Diagnosis not present

## 2019-04-28 DIAGNOSIS — IMO0002 Reserved for concepts with insufficient information to code with codable children: Secondary | ICD-10-CM

## 2019-04-28 DIAGNOSIS — E1165 Type 2 diabetes mellitus with hyperglycemia: Secondary | ICD-10-CM

## 2019-04-28 DIAGNOSIS — Z794 Long term (current) use of insulin: Secondary | ICD-10-CM

## 2019-04-28 DIAGNOSIS — I10 Essential (primary) hypertension: Secondary | ICD-10-CM

## 2019-04-28 DIAGNOSIS — E118 Type 2 diabetes mellitus with unspecified complications: Secondary | ICD-10-CM | POA: Diagnosis not present

## 2019-04-28 DIAGNOSIS — E782 Mixed hyperlipidemia: Secondary | ICD-10-CM

## 2019-04-28 MED ORDER — NOVOLOG FLEXPEN 100 UNIT/ML ~~LOC~~ SOPN: 30.0000 [IU] | PEN_INJECTOR | Freq: Three times a day (TID) | SUBCUTANEOUS | 2 refills | Status: DC

## 2019-04-28 NOTE — Progress Notes (Signed)
04/28/2019                                                    Endocrinology Telehealth Visit Follow up Note -During COVID -19 Pandemic  This visit type was conducted due to national recommendations for restrictions regarding the COVID-19 Pandemic  in an effort to limit this patient's exposure and mitigate transmission of the corona virus.  Due to her co-morbid illnesses, Sandy Valencia is at  moderate to high risk for complications without adequate follow up.  This format is felt to be most appropriate for her at this time.  I connected with this patient on 04/28/2019   by telephone and verified that I am speaking with the correct person using two identifiers. Sandy Valencia, 15-Feb-1961. she has verbally consented to this visit. All issues noted in this document were discussed and addressed. The format was not optimal for physical exam.    Subjective:    Patient ID: Sandy Valencia, female    DOB: 02/01/1961, PCP Sinda Du, MD   Past Medical History:  Diagnosis Date  . Angio-edema   . Arthritis   . Asthma   . Diabetes mellitus   . Fatty liver   . GERD (gastroesophageal reflux disease)   . Hypercholesteremia   . Hypertension   . Hypothyroidism   . Sleep apnea    pt siad, "i had small amount but could not tolerate CPAP and they said it was ok since it was mild.   Past Surgical History:  Procedure Laterality Date  . ABDOMINAL HYSTERECTOMY    . CHOLECYSTECTOMY    . COLONOSCOPY N/A 09/22/2012   RMR: colonic polyps -removed as described above. tubular adenoma, next TCS 09/2017  . COLONOSCOPY WITH PROPOFOL N/A 09/23/2017   Procedure: COLONOSCOPY WITH PROPOFOL;  Surgeon: Daneil Dolin, MD;  [facility];  Service: Endoscopy;  Laterality: N/A;  7:30am  . CYST EXCISION Left 07/19/2016   Procedure: CYST REMOVAL LEFT RING FINGER;  Surgeon: Carole Civil, MD;  Location: [facility] ORS;  Service: Orthopedics;  Laterality: Left;  . CYST REMOVAL LEG Right    foot  .  ESOPHAGOGASTRODUODENOSCOPY N/A 10/21/2014   RMR: Small hiatal hernia; otherwise normal EGD status post passage of a Maloney dilator.   Marland Kitchen EXCISION MASS UPPER EXTREMETIES Right 04/17/2018   Procedure: EXCISION MASS UPPER EXTREMETIES right ring finger;  Surgeon: Carole Civil, MD;  Location: [facility] ORS;  Service: Orthopedics;  Laterality: Right;  . MALONEY DILATION N/A 10/21/2014   Procedure: [name] DILATION;  Surgeon: Daneil Dolin, MD;  Location: [facility];  Service: Endoscopy;  Laterality: N/A;  . MIDDLE EAR SURGERY Right    patched hole in ear drum  . POLYPECTOMY  09/23/2017   Procedure: POLYPECTOMY;  Surgeon: Daneil Dolin, MD;  Location: [facility];  Service: Endoscopy;;  polyp hepatic flexure polyp cs, splenic flexure polyp cs, rectal polyp cs   Social History   Socioeconomic History  . Marital status: Divorced    Spouse name: Not on file  . Number of children: Not on file  . Years of education: Not on file  . Highest education level: Not on file  Occupational History  . Not on file  Social Needs  . Financial resource strain: Not on file  . Food insecurity  Worry: Not on file    Inability: Not on file  . Transportation needs    Medical: Not on file    Non-medical: Not on file  Tobacco Use  . Smoking status: Never Smoker  . Smokeless tobacco: Never Used  Substance and Sexual Activity  . Alcohol use: No    Alcohol/week: 0.0 standard drinks  . Drug use: No  . Sexual activity: Yes    Birth control/protection: Surgical  Lifestyle  . Physical activity    Days per week: Not on file    Minutes per session: Not on file  . Stress: Not on file  Relationships  . Social Herbalist on phone: Not on file    Gets together: Not on file    Attends religious service: Not on file    Active member of club or organization: Not on file    Attends meetings of clubs or organizations: Not on file    Relationship status: Not on file  Other Topics Concern  . Not on  file  Social History Narrative  . Not on file   Outpatient Encounter Medications as of 04/28/2019  Medication Sig  . albuterol (VENTOLIN HFA) 108 (90 Base) MCG/ACT inhaler INHALE TWO PUFFS INTO THE LUNGS EVERY SIX HOURS AS NEEDED FOR WHEEZING OR SHORTNESS OF BREATH  . aspirin EC 81 MG tablet Take 81 mg by mouth daily.  . benzonatate (TESSALON) 100 MG capsule Take 2 capsules (200 mg total) by mouth 3 (three) times daily as needed.  . Cholecalciferol (VITAMIN D-3) 1000 UNITS CAPS Take 1,000 Units by mouth daily.   . diclofenac (VOLTAREN) 50 MG EC tablet Take 1 tablet (50 mg total) by mouth 2 (two) times daily.  Marland Kitchen HYDROcodone-acetaminophen (NORCO) 7.5-325 MG tablet Take 1 tablet by mouth every 6 (six) hours as needed for moderate pain.  Marland Kitchen insulin aspart (NOVOLOG FLEXPEN) 100 UNIT/ML FlexPen Inject 30-36 Units into the skin 3 (three) times daily before meals.  Marland Kitchen LANTUS SOLOSTAR 100 UNIT/ML Solostar Pen INJECT 80 TO 100 UNITS IN THE SKIN DAILYAT 10:00 AM  . levothyroxine (SYNTHROID) 75 MCG tablet TAKE ONE (1) TABLET BY MOUTH EVERY DAY BEFORE BREAKFAST  . metFORMIN (GLUCOPHAGE) 500 MG tablet Take 500 mg by mouth 2 (two) times daily with a meal.  . metFORMIN (GLUCOPHAGE-XR) 500 MG 24 hr tablet TAKE ONE TABLET (500MG  TOTAL) BY MOUTH TWO TIMES DAILY AFTER A MEAL  . pantoprazole (PROTONIX) 40 MG tablet TAKE ONE (1) TABLET EACH DAY (Patient taking differently: Take 40 mg by mouth daily. )  . Semaglutide,0.25 or 0.5MG /DOS, 2 MG/1.5ML SOPN Inject 0.5 mg into the skin once a week.  . simvastatin (ZOCOR) 10 MG tablet TAKE ONE TABLET (10MG  TOTAL) BY MOUTH DAILY (Patient taking differently: Take 10 mg by mouth daily. )  . vitamin B-12 (CYANOCOBALAMIN) 500 MCG tablet Take 500 mcg by mouth daily.  . [DISCONTINUED] NOVOLOG FLEXPEN 100 UNIT/ML FlexPen INJECT 25-31 UNITS INTO THE SKIN 3 TIMESA DAY WITH MEALS   No facility-administered encounter medications on file as of 04/28/2019.    ALLERGIES: Allergies   Allergen Reactions  . Kiwi Extract Anaphylaxis, Swelling and Palpitations  . Other Anaphylaxis    Walnuts (avoids all tree nuts)   VACCINATION STATUS: Immunization History  Administered Date(s) Administered  . Influenza,inj,Quad PF,6+ Mos 03/30/2019    Diabetes She presents for her follow-up diabetic visit. She has type 2 diabetes mellitus. Her disease course has been worsening (She was diagnosed at approximate age of  34 years.). There are no hypoglycemic associated symptoms. Pertinent negatives for hypoglycemia include no confusion, pallor or seizures. Associated symptoms include polydipsia and polyuria. Pertinent negatives for diabetes include no polyphagia. There are no hypoglycemic complications. Symptoms are worsening. There are no diabetic complications. Risk factors for coronary artery disease include dyslipidemia, diabetes mellitus, hypertension and sedentary lifestyle. Current diabetic treatment includes insulin injections. She is compliant with treatment most of the time. Her weight is fluctuating minimally. She is following a generally unhealthy diet. She never participates in exercise. Her home blood glucose trend is decreasing rapidly. Her breakfast blood glucose range is generally 140-180 mg/dl. Her lunch blood glucose range is generally >200 mg/dl. Her dinner blood glucose range is generally >200 mg/dl. Her bedtime blood glucose range is generally >200 mg/dl. Her overall blood glucose range is >200 mg/dl. An ACE inhibitor/angiotensin II receptor blocker is being taken.  Hyperlipidemia This is a chronic problem. The current episode started more than 1 year ago. The problem is uncontrolled. Exacerbating diseases include diabetes, hypothyroidism and obesity. Pertinent negatives include no myalgias. Current antihyperlipidemic treatment includes statins. Risk factors for coronary artery disease include diabetes mellitus, dyslipidemia, hypertension, obesity, a sedentary lifestyle and  post-menopausal.    Objective:    There were no vitals taken for this visit.  Wt Readings from Last 3 Encounters:  01/31/19 230 lb (104.3 kg)  12/07/18 235 lb (106.6 kg)  05/26/18 240 lb (108.9 kg)    Lipid Panel     Component Value Date/Time   CHOL 107 12/18/2018 0710   TRIG 45 12/18/2018 0710   HDL 52 12/18/2018 0710   CHOLHDL 2.1 12/18/2018 0710   VLDL 10 03/07/2016 0705   LDLCALC 42 12/18/2018 0710    Recent Results (from the past 2160 hour(s))  Hemoglobin A1c     Status: Abnormal   Collection Time: 04/22/19  8:14 AM  Result Value Ref Range   Hgb A1c MFr Bld 9.6 (H) <5.7 % of total Hgb    Comment: For someone without known diabetes, a hemoglobin A1c value of 6.5% or greater indicates that they may have  diabetes and this should be confirmed with a follow-up  test. . For someone with known diabetes, a value <7% indicates  that their diabetes is well controlled and a value  greater than or equal to 7% indicates suboptimal  control. A1c targets should be individualized based on  duration of diabetes, age, comorbid conditions, and  other considerations. . Currently, no consensus exists regarding use of hemoglobin A1c for diagnosis of diabetes for children. .    Mean Plasma Glucose 229 (calc)   eAG (mmol/L) 12.7 (calc)  COMPLETE METABOLIC PANEL WITH GFR     Status: Abnormal   Collection Time: 04/22/19  8:14 AM  Result Value Ref Range   Glucose, Bld 164 (H) 65 - 99 mg/dL    Comment: .            Fasting reference interval . For someone without known diabetes, a glucose value >125 mg/dL indicates that they may have diabetes and this should be confirmed with a follow-up test. .    BUN 15 7 - 25 mg/dL   Creat 0.74 0.50 - 1.05 mg/dL    Comment: For patients >7 years of age, the reference limit for Creatinine is approximately 13% higher for people identified as African-American. .    GFR, Est Non African American 90 > OR = 60 mL/min/1.74m2   GFR, Est  African American 104 > OR =  60 mL/min/1.25m2   BUN/Creatinine Ratio NOT APPLICABLE 6 - 22 (calc)   Sodium 138 135 - 146 mmol/L   Potassium 4.3 3.5 - 5.3 mmol/L   Chloride 100 98 - 110 mmol/L   CO2 28 20 - 32 mmol/L   Calcium 10.0 8.6 - 10.4 mg/dL   Total Protein 7.2 6.1 - 8.1 g/dL   Albumin 4.7 3.6 - 5.1 g/dL   Globulin 2.5 1.9 - 3.7 g/dL (calc)   AG Ratio 1.9 1.0 - 2.5 (calc)   Total Bilirubin 0.5 0.2 - 1.2 mg/dL   Alkaline phosphatase (APISO) 59 37 - 153 U/L   AST 34 10 - 35 U/L   ALT 52 (H) 6 - 29 U/L  T4, free     Status: None   Collection Time: 04/22/19  8:14 AM  Result Value Ref Range   Free T4 1.3 0.8 - 1.8 ng/dL  TSH     Status: None   Collection Time: 04/22/19  8:14 AM  Result Value Ref Range   TSH 1.12 0.40 - 4.50 mIU/L     Assessment & Plan:   1. Uncontrolled type 2 diabetes mellitus with complication, with long-term current use of insulin ([place])  -Her  diabetes is  complicated by history of noncompliance (she recently showed better engagement) and patient remains at  extremely high risk for more acute and chronic complications of diabetes which include CAD, CVA, CKD, retinopathy, and neuropathy. These are all discussed in detail with the patient.  -She continues to report significantly above target glycemic profile, particularly postprandial.  Her previsit labs show A1c of 9.6%, progressively increasing from 7.9%.   - I have re-counseled the patient on diet management and weight loss  by adopting a carbohydrate restricted / protein rich  Diet.  - she  admits there is a room for improvement in her diet and drink choices. -  Suggestion is made for her to avoid simple carbohydrates  from her diet including Cakes, Sweet Desserts / Pastries, Ice Cream, Soda (diet and regular), Sweet Tea, Candies, Chips, Cookies, Sweet Pastries,  Store Bought Juices, Alcohol in Excess of  1-2 drinks a day, Artificial Sweeteners, Coffee Creamer, and "Sugar-free" Products. This will help  patient to have stable blood glucose profile and potentially avoid unintended weight gain.  - Patient is advised to stick to a routine mealtimes to eat 3 meals  a day and avoid unnecessary snacks ( to snack only to correct hypoglycemia).  - I have approached patient with the following individualized plan to manage diabetes and patient agrees.  -She will continue to require intensive treatment with higher dose of basal/bolus insulin in order for her to achieve and maintain control of diabetes to target. -She is advised to continue Lantus 80 units nightly, increase NovoLog to 30  units 3 times a day before meals plus correction.  She is advised to continue to use her continuous glucose monitoring device at all times. - She has tolerated metformin.  She is advised to continue metformin  500 mg ER to twice a day after breakfast and supper.  -Her insurance did not provide coverage for Ozempic.  She will be given samples of Ozempic from clinic, to use 0.5 mg subcutaneously weekly.   -She will be considered for insulin U500 on subsequent visits if she ends up requiring more than 200 units of insulin U100.  2) Lipids/HPL: Her recent lipid panel showed controlled with LDL of 50.  She is advised to continue simvastatin 10 mg  p.o. nightly.   Side effects and precautions discussed with her.    3) Hypothyroidism:  -Her recent labs are consistent with appropriate thyroid hormone replacement. She is advised to continue levothyroxine 75 mcg p.o. every morning.   - We discussed about the correct intake of her thyroid hormone, on empty stomach at fasting, with water, separated by at least 30 minutes from breakfast and other medications,  and separated by more than 4 hours from calcium, iron, multivitamins, acid reflux medications (PPIs). -Patient is made aware of the fact that thyroid hormone replacement is needed for life, dose to be adjusted by periodic monitoring of thyroid function tests.   - I advised  patient to maintain close follow up with Sinda Du, MD for primary care needs.  - Patient Care Time Today:  25 min, of which >50% was spent in  counseling and the rest reviewing her  current and  previous labs/studies, previous treatments, her blood glucose readings, and medications' doses and developing a plan for long-term care based on the latest recommendations for standards of care.   Sandy Valencia participated in the discussions, expressed understanding, and voiced agreement with the above plans.  All questions were answered to her satisfaction. she is encouraged to contact clinic should she have any questions or concerns prior to her return visit.   Follow up plan: -Return in about 3 months (around 07/29/2019) for Bring Meter and Logs- A1c in Office, Include 8 log sheets.  Glade Lloyd, MD Phone: [phone]  Fax: [phone]   -  This note was partially dictated with voice recognition software. Similar sounding words can be transcribed inadequately or may not  be corrected upon review.  04/28/2019, 10:45 AM

## 2019-05-08 DIAGNOSIS — E119 Type 2 diabetes mellitus without complications: Secondary | ICD-10-CM | POA: Diagnosis not present

## 2019-05-08 DIAGNOSIS — M545 Low back pain: Secondary | ICD-10-CM | POA: Diagnosis not present

## 2019-05-08 DIAGNOSIS — J45909 Unspecified asthma, uncomplicated: Secondary | ICD-10-CM | POA: Diagnosis not present

## 2019-05-08 DIAGNOSIS — I10 Essential (primary) hypertension: Secondary | ICD-10-CM | POA: Diagnosis not present

## 2019-05-18 DIAGNOSIS — M79673 Pain in unspecified foot: Secondary | ICD-10-CM | POA: Diagnosis not present

## 2019-05-18 DIAGNOSIS — M545 Low back pain: Secondary | ICD-10-CM | POA: Diagnosis not present

## 2019-05-18 DIAGNOSIS — M542 Cervicalgia: Secondary | ICD-10-CM | POA: Diagnosis not present

## 2019-05-20 ENCOUNTER — Other Ambulatory Visit: Payer: Self-pay

## 2019-05-20 ENCOUNTER — Encounter: Payer: Self-pay | Admitting: Allergy & Immunology

## 2019-05-20 ENCOUNTER — Ambulatory Visit (INDEPENDENT_AMBULATORY_CARE_PROVIDER_SITE_OTHER): Payer: Medicaid Other | Admitting: Allergy & Immunology

## 2019-05-20 VITALS — BP 114/80 | HR 83 | Temp 97.3°F | Resp 20 | Ht 66.9 in | Wt 244.6 lb

## 2019-05-20 DIAGNOSIS — J4520 Mild intermittent asthma, uncomplicated: Secondary | ICD-10-CM

## 2019-05-20 DIAGNOSIS — T7800XD Anaphylactic reaction due to unspecified food, subsequent encounter: Secondary | ICD-10-CM | POA: Diagnosis not present

## 2019-05-20 DIAGNOSIS — J310 Chronic rhinitis: Secondary | ICD-10-CM | POA: Diagnosis not present

## 2019-05-20 MED ORDER — EPINEPHRINE 0.3 MG/0.3ML IJ SOAJ: 0.3000 mg | Freq: Once | INTRAMUSCULAR | 1 refills | Status: AC

## 2019-05-20 MED ORDER — PANTOPRAZOLE SODIUM 40 MG PO TBEC: 40.0000 mg | DELAYED_RELEASE_TABLET | Freq: Every day | ORAL | 5 refills | Status: DC

## 2019-05-20 MED ORDER — ALBUTEROL SULFATE HFA 108 (90 BASE) MCG/ACT IN AERS: 4.0000 | INHALATION_SPRAY | Freq: Four times a day (QID) | RESPIRATORY_TRACT | 2 refills | Status: DC | PRN

## 2019-05-20 NOTE — Patient Instructions (Addendum)
1. Mild intermittent asthma, uncomplicated - Lung testing deferred.  - Continue with albuterol 4 puffs every 4-6 hours as needed.   - There is no need for a controller medication at this time.   2. Gastroesophageal reflux disease - Continue with pantoprazole 40mg  daily.  3. 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 - EpiPen refilled today.    4. Return in about 1 year (around 05/19/2020).   Please inform us of any Emergency Department visits, hospitalizations, or changes in symptoms. Call us before going to the ED for breathing or allergy symptoms since we might be able to fit you in for a sick visit. Feel free to contact us anytime with any questions, problems, or concerns.  It was a pleasure to see you again today!  Websites that have reliable patient information: 1. American Academy of Asthma, Allergy, and Immunology: www.aaaai.org 2. Food Allergy Research and Education (FARE): foodallergy.org 3. Mothers of Asthmatics: http://www.asthmacommunitynetwork.org 4. American College of Allergy, Asthma, and Immunology: MonthlyElectricBill.co.uk   Make sure you are registered to vote! If you have moved or changed any of your contact information, you will need to get this updated before voting!

## 2019-05-20 NOTE — Progress Notes (Signed)
FOLLOW UP  Date of Service/Encounter:  05/20/19   Assessment:   Mild intermittent asthma, uncomplicated  Adverse food reaction (walnut, kiwi, seafood)   Gastroesophageal reflux disease - controlled with a pantoprazole  Chronic nonallergic rhinitis - with negative environmental IgE panel in 2019   Plan/Recommendations:   1. Mild intermittent asthma, uncomplicated - Lung testing deferred.  - Continue with albuterol 4 puffs every 4-6 hours as needed.   - There is no need for a controller medication at this time.   2. Gastroesophageal reflux disease - Continue with pantoprazole 40mg  daily.  3. 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 - EpiPen refilled today.    4. Return in about 1 year (around 05/19/2020).  Subjective:   Sandy Valencia is a 59 y.o. female presenting today for follow up of  Chief Complaint  Patient presents with  . Asthma    Doing well. No complaints.    Sandy Valencia has a history of the following: Patient Active Problem List   Diagnosis Date Noted  . Mild intermittent asthma without complication 123456  . Gastroesophageal reflux disease 05/21/2018  . Chronic rhinitis 05/21/2018  . Mass of finger, right s/p surgical excision (mucoid cyst) 04/17/18 04/22/2018  . History of colonic polyps 08/26/2017  . Hypothyroidism 04/22/2017  . Mixed hyperlipidemia 12/31/2016  . Mucous cyst of digit of right hand   . Uncontrolled type 2 diabetes mellitus with complication, with long-term current use of insulin ([place]) 05/18/2015  . Essential hypertension, benign 05/18/2015  . Class 2 severe obesity due to excess calories with serious comorbidity and body mass index (BMI) of 38.0 to 38.9 in adult ([place]) 05/18/2015  . Personal history of noncompliance with medical treatment, presenting hazards to health 05/18/2015  . GERD (gastroesophageal reflux disease) 02/14/2015  . Anemia, iron deficiency 02/14/2015  . Fatty liver 02/14/2015  .  Hiatal hernia   . Dysphagia, pharyngoesophageal phase   . Dysphagia 10/11/2014  . Dyspepsia 10/11/2014  . Family history of colon cancer 09/03/2012    History obtained from: chart review and patient.  Sandy Valencia is a 59 y.o. female presenting for a follow up visit.  I first saw her in September 2019.  At that time, her asthma was controlled with albuterol as needed.  She was concerned with a peanut, tree nut, seafood, and kiwi allergy.  All of the testing was negative.  We did get peanut testing via blood and then did a peanut challenge, which she passed.  Her kiwi IgE was elevated at 2.66, but she was negative to the environmental allergy panel, tree nuts, peanuts, and seafood.  For her GERD, we continued Protonix 40 mg daily.  We did not do environmental allergy testing.  Since last visit, she has actually done pretty well.  She was diagnosed with COVID-19 in August 2020.  Thankfully, she had nothing aside from some muscle aches.  However, she and her grandson did isolate for 2 weeks in their home.  They also did contact tracing for any when they might have been exposed.  Asthma/Respiratory Symptom History: She does have an albuterol inhaler, which she did use in November when she had a viral bronchitis.  Otherwise, she does not remember the last time that she needed it.  She has never been on a controller medication and was told in the past that she would not take something every day anyway.  She was followed by Dr. Luan Pulling, but he is retiring.  Allergic Rhinitis Symptom  History: She is not doing anything in particular for her rhinitis.  She had testing that was negative the blood in 2019.  She has not needed antibiotics at all since last visit.  Food Allergy Symptom History: She continues to avoid kiwi.  Although the testing was negative to walnut, she is not interested in introducing this.  Her EpiPen is up-to-date.  She is also avoiding seafood.  Ironically since passing the peanut challenge, she  has had no interest in peanuts.  Otherwise, there have been no changes to her past medical history, surgical history, family history, or social history.    Review of Systems  Constitutional: Negative.  Negative for chills, fever, malaise/fatigue and weight loss.  HENT: Negative.  Negative for congestion, ear discharge, ear pain and sore throat.   Eyes: Negative for pain, discharge and redness.  Respiratory: Negative for cough, sputum production, shortness of breath and wheezing.   Cardiovascular: Negative.  Negative for chest pain and palpitations.  Gastrointestinal: Negative for abdominal pain, constipation, diarrhea, heartburn, nausea and vomiting.  Skin: Negative.  Negative for itching and rash.  Neurological: Negative for dizziness and headaches.  Endo/Heme/Allergies: Negative for environmental allergies. Does not bruise/bleed easily.       Objective:   Blood pressure 114/80, pulse 83, temperature (!) 97.3 F (36.3 C), temperature source Temporal, resp. rate 20, height 5' 6.9" (1.699 m), weight 244 lb 9.6 oz (110.9 kg), SpO2 96 %. Body mass index is 38.42 kg/m.   Physical Exam:  Physical Exam  Constitutional: She appears well-developed.  Very talkative female.  Obese.  HENT:  Head: Normocephalic and atraumatic.  Right Ear: Tympanic membrane, external ear and ear canal normal. No drainage, swelling or tenderness. Tympanic membrane is not injected, not scarred, not erythematous, not retracted and not bulging.  Left Ear: Tympanic membrane, external ear and ear canal normal. No drainage, swelling or tenderness. Tympanic membrane is not injected, not scarred, not erythematous, not retracted and not bulging.  Nose: No mucosal edema, rhinorrhea, nasal deformity or septal deviation. No epistaxis. Right sinus exhibits no maxillary sinus tenderness and no frontal sinus tenderness. Left sinus exhibits no maxillary sinus tenderness and no frontal sinus tenderness.  Mouth/Throat: Uvula  is midline and oropharynx is clear and moist. Mucous membranes are not pale and not dry.  Eyes: Pupils are equal, round, and reactive to light. Conjunctivae and EOM are normal. Right eye exhibits no chemosis and no discharge. Left eye exhibits no chemosis and no discharge. Right conjunctiva is not injected. Left conjunctiva is not injected.  Cardiovascular: Normal rate, regular rhythm and normal heart sounds.  Respiratory: Effort normal and breath sounds normal. No accessory muscle usage. No tachypnea. No respiratory distress. She has no wheezes. She has no rhonchi. She has no rales. She exhibits no tenderness.  Moving air well in all lung fields.  No increased work of breathing.  Lymphadenopathy:       Head (right side): No submandibular, no tonsillar and no occipital adenopathy present.       Head (left side): No submandibular, no tonsillar and no occipital adenopathy present.    She has no cervical adenopathy.  Neurological: She is alert.  Skin: No abrasion, no petechiae and no rash noted. Rash is not papular, not vesicular and not urticarial. No erythema. No pallor.  No urticarial or eczematous lesions noted.  Psychiatric: She has a normal mood and affect.     Diagnostic studies: none     Salvatore Marvel, MD  Allergy and Asthma  [facility]

## 2019-05-22 ENCOUNTER — Other Ambulatory Visit: Payer: Self-pay | Admitting: "Endocrinology

## 2019-05-26 DIAGNOSIS — L03032 Cellulitis of left toe: Secondary | ICD-10-CM | POA: Diagnosis not present

## 2019-05-26 DIAGNOSIS — L03031 Cellulitis of right toe: Secondary | ICD-10-CM | POA: Diagnosis not present

## 2019-05-26 DIAGNOSIS — B351 Tinea unguium: Secondary | ICD-10-CM | POA: Diagnosis not present

## 2019-06-03 DIAGNOSIS — K029 Dental caries, unspecified: Secondary | ICD-10-CM | POA: Diagnosis not present

## 2019-06-18 DIAGNOSIS — M25673 Stiffness of unspecified ankle, not elsewhere classified: Secondary | ICD-10-CM | POA: Diagnosis not present

## 2019-06-18 DIAGNOSIS — M542 Cervicalgia: Secondary | ICD-10-CM | POA: Diagnosis not present

## 2019-06-18 DIAGNOSIS — M79673 Pain in unspecified foot: Secondary | ICD-10-CM | POA: Diagnosis not present

## 2019-06-18 DIAGNOSIS — M545 Low back pain: Secondary | ICD-10-CM | POA: Diagnosis not present

## 2019-06-19 DIAGNOSIS — J329 Chronic sinusitis, unspecified: Secondary | ICD-10-CM | POA: Diagnosis not present

## 2019-06-19 DIAGNOSIS — J069 Acute upper respiratory infection, unspecified: Secondary | ICD-10-CM | POA: Diagnosis not present

## 2019-07-10 ENCOUNTER — Other Ambulatory Visit: Payer: Self-pay | Admitting: Family Medicine

## 2019-07-14 DIAGNOSIS — M79673 Pain in unspecified foot: Secondary | ICD-10-CM | POA: Diagnosis not present

## 2019-07-14 DIAGNOSIS — M25511 Pain in right shoulder: Secondary | ICD-10-CM | POA: Diagnosis not present

## 2019-07-14 DIAGNOSIS — M542 Cervicalgia: Secondary | ICD-10-CM | POA: Diagnosis not present

## 2019-07-14 DIAGNOSIS — M25569 Pain in unspecified knee: Secondary | ICD-10-CM | POA: Diagnosis not present

## 2019-07-14 DIAGNOSIS — G8929 Other chronic pain: Secondary | ICD-10-CM | POA: Diagnosis not present

## 2019-07-14 DIAGNOSIS — M545 Low back pain: Secondary | ICD-10-CM | POA: Diagnosis not present

## 2019-07-20 ENCOUNTER — Other Ambulatory Visit: Payer: Self-pay | Admitting: "Endocrinology

## 2019-07-30 ENCOUNTER — Ambulatory Visit (INDEPENDENT_AMBULATORY_CARE_PROVIDER_SITE_OTHER): Payer: Medicaid Other | Admitting: "Endocrinology

## 2019-07-30 ENCOUNTER — Encounter: Payer: Self-pay | Admitting: "Endocrinology

## 2019-07-30 ENCOUNTER — Other Ambulatory Visit: Payer: Self-pay

## 2019-07-30 VITALS — BP 167/79 | HR 72 | Ht 66.0 in | Wt 247.8 lb

## 2019-07-30 DIAGNOSIS — E782 Mixed hyperlipidemia: Secondary | ICD-10-CM

## 2019-07-30 DIAGNOSIS — E118 Type 2 diabetes mellitus with unspecified complications: Secondary | ICD-10-CM | POA: Diagnosis not present

## 2019-07-30 DIAGNOSIS — E039 Hypothyroidism, unspecified: Secondary | ICD-10-CM

## 2019-07-30 DIAGNOSIS — I10 Essential (primary) hypertension: Secondary | ICD-10-CM | POA: Diagnosis not present

## 2019-07-30 DIAGNOSIS — Z794 Long term (current) use of insulin: Secondary | ICD-10-CM

## 2019-07-30 DIAGNOSIS — E1165 Type 2 diabetes mellitus with hyperglycemia: Secondary | ICD-10-CM

## 2019-07-30 DIAGNOSIS — IMO0002 Reserved for concepts with insufficient information to code with codable children: Secondary | ICD-10-CM

## 2019-07-30 LAB — POCT GLYCOSYLATED HEMOGLOBIN (HGB A1C): Hemoglobin A1C: 8.6 % — AB (ref 4.0–5.6)

## 2019-07-30 MED ORDER — GLIPIZIDE ER 5 MG PO TB24: 5.0000 mg | ORAL_TABLET | Freq: Every day | ORAL | 3 refills | Status: DC

## 2019-07-30 MED ORDER — NOVOLOG FLEXPEN 100 UNIT/ML ~~LOC~~ SOPN: 20.0000 [IU] | PEN_INJECTOR | Freq: Three times a day (TID) | SUBCUTANEOUS | 1 refills | Status: DC

## 2019-07-30 NOTE — Patient Instructions (Signed)

## 2019-07-30 NOTE — Progress Notes (Signed)
07/30/2019   Endocrinology follow-up note   Subjective:    Patient ID: Sandy Valencia, female    DOB: [birth year], PCP Sinda Du, MD   Past Medical History:  Diagnosis Date  . Allergic rhinitis due to pollen   . Anaphylactic shock, unspecified, subsequent encounter   . Angio-edema   . Arthritis   . Asthma   . Asymptomatic varicose veins of right lower extremity   . Chronic obstructive pulmonary disease with (acute) exacerbation ([place])   . COVID-19   . Diabetes mellitus   . Essential (primary) hypertension   . Fatty liver   . Gastro-esophageal reflux disease without esophagitis   . GERD (gastroesophageal reflux disease)   . Hypercholesteremia   . Hypertension   . Hypothyroidism   . Low back pain   . Obesity, unspecified   . Other adverse food reactions, not elsewhere classified, initial encounter   . Rash and other nonspecific skin eruption   . Sleep apnea    pt siad, "i had small amount but could not tolerate CPAP and they said it was ok since it was mild.  . Sleep apnea, unspecified   . Unspecified asthma, uncomplicated   . Zoster without complications    Past Surgical History:  Procedure Laterality Date  . ABDOMINAL HYSTERECTOMY    . CHOLECYSTECTOMY    . COLONOSCOPY N/A 09/22/2012   RMR: colonic polyps -removed as described above. tubular adenoma, next TCS 09/2017  . COLONOSCOPY WITH PROPOFOL N/A 09/23/2017   Procedure: COLONOSCOPY WITH PROPOFOL;  Surgeon: Daneil Dolin, MD;  Location[facility];  Service: Endoscopy;  Laterality: N/A;  7:30am  . CYST EXCISION Left 07/19/2016   Procedure: CYST REMOVAL LEFT RING FINGER;  Surgeon: Carole Civil, MD;  Location: [facility];  Service: Orthopedics;  Laterality: Left;  . CYST REMOVAL LEG Right    foot  . ESOPHAGOGASTRODUODENOSCOPY N/A 10/21/2014   RMR: Small hiatal hernia; otherwise normal EGD status post passage of a Maloney dilator.   Marland Kitchen EXCISION MASS UPPER EXTREMETIES Right 04/17/2018   Procedure: EXCISION MASS  UPPER EXTREMETIES right ring finger;  Surgeon: Carole Civil, MD;  Location: [facility];  Service: Orthopedics;  Laterality: Right;  . MALONEY DILATION N/A 10/21/2014   Procedure: [name] DILATION;  Surgeon: Daneil Dolin, MD;  Location: [facility];  Service: Endoscopy;  Laterality: N/A;  . MIDDLE EAR SURGERY Right    patched hole in ear drum  . POLYPECTOMY  09/23/2017   Procedure: POLYPECTOMY;  Surgeon: Daneil Dolin, MD;  Location: [facility];  Service: Endoscopy;;  polyp hepatic flexure polyp cs, splenic flexure polyp cs, rectal polyp cs   Social History   Socioeconomic History  . Marital status: Divorced    Spouse name: Not on file  . Number of children: Not on file  . Years of education: Not on file  . Highest education level: Not on file  Occupational History  . Not on file  Tobacco Use  . Smoking status: Never Smoker  . Smokeless tobacco: Never Used  Substance and Sexual Activity  . Alcohol use: No    Alcohol/week: 0.0 standard drinks  . Drug use: No  . Sexual activity: Yes    Birth control/protection: Surgical  Other Topics Concern  . Not on file  Social History Narrative  . Not on file   Social Determinants of Health   Financial Resource Strain:   . Difficulty of Paying Living Expenses: Not on file  Food Insecurity:   .  Worried About Charity fundraiser in the Last Year: Not on file  . Ran Out of Food in the Last Year: Not on file  Transportation Needs:   . Lack of Transportation (Medical): Not on file  . Lack of Transportation (Non-Medical): Not on file  Physical Activity:   . Days of Exercise per Week: Not on file  . Minutes of Exercise per Session: Not on file  Stress:   . Feeling of Stress : Not on file  Social Connections:   . Frequency of Communication with Friends and Family: Not on file  . Frequency of Social Gatherings with Friends and Family: Not on file  . Attends Religious Services: Not on file  . Active Member of Clubs or  Organizations: Not on file  . Attends Archivist Meetings: Not on file  . Marital Status: Not on file   Outpatient Encounter Medications as of 07/30/2019  Medication Sig  . Accu-Chek FastClix Lancets MISC USE TO TEST 4 TIMES DAILY.  Marland Kitchen ACCU-CHEK GUIDE test strip USE TO TEST 4 TIMES DAILY.  Marland Kitchen albuterol (VENTOLIN HFA) 108 (90 Base) MCG/ACT inhaler INHALE TWO PUFFS INTO THE LUNGS EVERY SIX HOURS AS NEEDED FOR WHEEZING OR SHORTNESS OF BREATH  . albuterol (VENTOLIN HFA) 108 (90 Base) MCG/ACT inhaler Inhale 4 puffs into the lungs every 6 (six) hours as needed for wheezing or shortness of breath.  Marland Kitchen aspirin EC 81 MG tablet Take 81 mg by mouth daily.  . beclomethasone (QVAR) 80 MCG/ACT inhaler Inhale 2 puffs into the lungs 2 (two) times daily.  . benzonatate (TESSALON) 100 MG capsule Take 2 capsules (200 mg total) by mouth 3 (three) times daily as needed.  . Cholecalciferol (VITAMIN D-3) 1000 UNITS CAPS Take 1,000 Units by mouth daily.   . diclofenac (VOLTAREN) 50 MG EC tablet Take 1 tablet (50 mg total) by mouth 2 (two) times daily.  Marland Kitchen EPINEPHrine 0.3 mg/0.3 mL IJ SOAJ injection Inject 0.3 mg into the muscle as needed for anaphylaxis.  Marland Kitchen glipiZIDE (GLUCOTROL XL) 5 MG 24 hr tablet Take 1 tablet (5 mg total) by mouth daily with breakfast.  . insulin aspart (NOVOLOG FLEXPEN) 100 UNIT/ML FlexPen Inject 20-26 Units into the skin 3 (three) times daily with meals.  Marland Kitchen LANTUS SOLOSTAR 100 UNIT/ML Solostar Pen INJECT 80 TO 100 UNITS IN THE SKIN DAILYAT 10:00 AM  . levothyroxine (SYNTHROID) 75 MCG tablet TAKE ONE (1) TABLET BY MOUTH EVERY DAY BEFORE BREAKFAST  . meclizine (ANTIVERT) 25 MG tablet Take 25 mg by mouth 4 (four) times daily.  . metFORMIN (GLUCOPHAGE-XR) 500 MG 24 hr tablet TAKE ONE TABLET BY MOUTH TWICE A DAY AFTER A MEAL  . pantoprazole (PROTONIX) 40 MG tablet TAKE ONE (1) TABLET EACH DAY (Patient taking differently: Take 40 mg by mouth daily. )  . pantoprazole (PROTONIX) 40 MG tablet Take  1 tablet (40 mg total) by mouth daily.  . Semaglutide,0.25 or 0.5MG /DOS, 2 MG/1.5ML SOPN Inject 0.5 mg into the skin once a week.  . simvastatin (ZOCOR) 10 MG tablet TAKE ONE TABLET (10MG  TOTAL) BY MOUTH DAILY (Patient taking differently: Take 10 mg by mouth daily. )  . vitamin B-12 (CYANOCOBALAMIN) 500 MCG tablet Take 500 mcg by mouth daily.  . [DISCONTINUED] insulin aspart (NOVOLOG FLEXPEN) 100 UNIT/ML FlexPen Inject 30 Units into the skin 3 (three) times daily with meals.  . [DISCONTINUED] metFORMIN (GLUCOPHAGE) 500 MG tablet Take 500 mg by mouth 2 (two) times daily with a meal.   No  facility-administered encounter medications on file as of 07/30/2019.   ALLERGIES: Allergies  Allergen Reactions  . Kiwi Extract Anaphylaxis, Swelling and Palpitations  . Other Anaphylaxis    Walnuts (avoids all tree nuts)   VACCINATION STATUS: Immunization History  Administered Date(s) Administered  . Influenza,inj,Quad PF,6+ Mos 03/30/2019  . Influenza-Unspecified 04/17/2011, 03/24/2012, 05/12/2013, 02/23/2015, 04/04/2017, 04/08/2018    Diabetes She presents for her follow-up diabetic visit. She has type 2 diabetes mellitus. Her disease course has been improving (She was diagnosed at approximate age of 78 years.). There are no hypoglycemic associated symptoms. Pertinent negatives for hypoglycemia include no confusion, pallor or seizures. Associated symptoms include polydipsia and polyuria. Pertinent negatives for diabetes include no polyphagia. There are no hypoglycemic complications. Symptoms are improving. There are no diabetic complications. Risk factors for coronary artery disease include dyslipidemia, diabetes mellitus, hypertension and sedentary lifestyle. Current diabetic treatment includes insulin injections. She is compliant with treatment most of the time. Her weight is fluctuating minimally. She is following a generally unhealthy diet. She never participates in exercise. Her home blood glucose  trend is decreasing steadily. Her breakfast blood glucose range is generally 180-200 mg/dl. Her lunch blood glucose range is generally 180-200 mg/dl. Her dinner blood glucose range is generally 180-200 mg/dl. Her bedtime blood glucose range is generally 180-200 mg/dl. Her overall blood glucose range is 180-200 mg/dl. An ACE inhibitor/angiotensin II receptor blocker is being taken.  Hyperlipidemia This is a chronic problem. The current episode started more than 1 year ago. The problem is uncontrolled. Exacerbating diseases include diabetes, hypothyroidism and obesity. Pertinent negatives include no myalgias. Current antihyperlipidemic treatment includes statins. Risk factors for coronary artery disease include diabetes mellitus, dyslipidemia, hypertension, obesity, a sedentary lifestyle and post-menopausal.     Review of systems  Constitutional: + Minimally fluctuating body weight,  current  Body mass index is 40 kg/m. , no fatigue, no subjective hyperthermia, no subjective hypothermia Eyes: no blurry vision, no xerophthalmia ENT: no sore throat, no nodules palpated in throat, no dysphagia/odynophagia, no hoarseness Cardiovascular: no Chest Pain, no Shortness of Breath, no palpitations, no leg swelling Respiratory: no cough, no shortness of breath Gastrointestinal: no Nausea/Vomiting/Diarhhea Musculoskeletal: no muscle/joint aches Skin: no rashes, no hyperemia Neurological: no tremors, no numbness, no tingling, no dizziness Psychiatric: no depression, no anxiety   Objective:    BP (!) 167/79   Pulse 72   Ht 5\' 6"  (1.676 m)   Wt 247 lb 12.8 oz (112.4 kg)   BMI 40.00 kg/m   Wt Readings from Last 3 Encounters:  07/30/19 247 lb 12.8 oz (112.4 kg)  05/20/19 244 lb 9.6 oz (110.9 kg)  01/31/19 230 lb (104.3 kg)      Physical Exam- Limited  Constitutional:  Body mass index is 40 kg/m. , not in acute distress, normal state of mind Eyes:  EOMI, no exophthalmos Neck: Supple Thyroid: No  gross goiter Respiratory: Adequate breathing efforts Musculoskeletal: no gross deformities, strength intact in all four extremities, no gross restriction of joint movements Skin:  no rashes, no hyperemia Neurological: no tremor with outstretched hands,   Lipid Panel     Component Value Date/Time   CHOL 107 12/18/2018 0710   TRIG 45 12/18/2018 0710   HDL 52 12/18/2018 0710   CHOLHDL 2.1 12/18/2018 0710   VLDL 10 03/07/2016 0705   LDLCALC 42 12/18/2018 0710    Recent Results (from the past 2160 hour(s))  HgB A1c     Status: Abnormal   Collection Time: 07/30/19 11:36 AM  Result Value Ref Range   Hemoglobin A1C 8.6 (A) 4.0 - 5.6 %   HbA1c POC (<> result, manual entry)     HbA1c, POC (prediabetic range)     HbA1c, POC (controlled diabetic range)       Assessment & Plan:   1. Uncontrolled type 2 diabetes mellitus with complication, with long-term current use of insulin ([place]) -She returns with improved glycemic profile and A1c of 8.6% improving from 9.6%. -Her  diabetes is  complicated by history of noncompliance (she recently showed better engagement) and patient remains at  extremely high risk for more acute and chronic complications of diabetes which include CAD, CVA, CKD, retinopathy, and neuropathy. These are all discussed in detail with the patient.  - I have re-counseled the patient on diet management and weight loss  by adopting a carbohydrate restricted / protein rich  Diet.  - she  admits there is a room for improvement in her diet and drink choices. -  Suggestion is made for her to avoid simple carbohydrates  from her diet including Cakes, Sweet Desserts / Pastries, Ice Cream, Soda (diet and regular), Sweet Tea, Candies, Chips, Cookies, Sweet Pastries,  Store Bought Juices, Alcohol in Excess of  1-2 drinks a day, Artificial Sweeteners, Coffee Creamer, and "Sugar-free" Products. This will help patient to have stable blood glucose profile and potentially avoid unintended weight  gain.   - Patient is advised to stick to a routine mealtimes to eat 3 meals  a day and avoid unnecessary snacks ( to snack only to correct hypoglycemia).  - I have approached patient with the following individualized plan to manage diabetes and patient agrees.  -She will continue to require intensive treatment with  basal/bolus insulin in order for her to achieve and maintain control of diabetes to target. -She is advised to continue Lantus 80 units nightly,  NovoLog 20  units 3 times a day before meals plus correction.  She is advised to continue to use her continuous glucose monitoring device at all times. - She has tolerated metformin.  She is advised to continue metformin  500 mg ER to twice a day after breakfast and supper.  -Her insurance did not provide coverage for Ozempic.  She will be given samples of Ozempic from clinic, to use 0.5 mg subcutaneously weekly.   -She will be considered for insulin U500 on subsequent visits if she ends up requiring more than 200 units of insulin U100. -She will also benefit from low-dose glipizide.  I discussed and added glipizide 5 mg XL p.o. daily with breakfast.  2) Lipids/HPL: Her recent lipid panel showed controlled with LDL of 50.  She is advised to continue simvastatin 10 mg p.o. nightly.   Side effects and precautions discussed with her.     3) Hypertension: Blood pressure is not controlled to target. -Will be considered for low-dose losartan  next visit.  4) Hypothyroidism:  -Her recent labs are consistent with appropriate thyroid hormone replacement. She is advised to continue levothyroxine 75 mcg p.o. every morning.  - We discussed about the correct intake of her thyroid hormone, on empty stomach at fasting, with water, separated by at least 30 minutes from breakfast and other medications,  and separated by more than 4 hours from calcium, iron, multivitamins, acid reflux medications (PPIs). -Patient is made aware of the fact that thyroid  hormone replacement is needed for life, dose to be adjusted by periodic monitoring of thyroid function tests.  - I advised patient to  maintain close follow up with Sinda Du, MD for primary care needs.  - Time spent on this patient care encounter:  35 min, of which > 50% was spent in  counseling and the rest reviewing her blood glucose logs , discussing her hypoglycemia and hyperglycemia episodes, reviewing her current and  previous labs / studies  ( including abstraction from other facilities) and medications  doses and developing a  long term treatment plan and documenting her care.   Please refer to Patient Instructions for Blood Glucose Monitoring and Insulin/Medications Dosing Guide"  in media tab for additional information. Please  also refer to " Patient Self Inventory" in the Media  tab for reviewed elements of pertinent patient history.  Gillian Scarce participated in the discussions, expressed understanding, and voiced agreement with the above plans.  All questions were answered to her satisfaction. she is encouraged to contact clinic should she have any questions or concerns prior to her return visit.   Follow up plan: -Return in about 4 months (around 11/27/2019) for Bring Meter and Logs- A1c in Office, Follow up with Pre-visit Labs.  Glade Lloyd, MD Phone: [phone]  Fax: [phone]   -  This note was partially dictated with voice recognition software. Similar sounding words can be transcribed inadequately or may not  be corrected upon review.  07/30/2019, 1:15 PM

## 2019-08-11 ENCOUNTER — Other Ambulatory Visit: Payer: Self-pay | Admitting: Family Medicine

## 2019-08-18 ENCOUNTER — Other Ambulatory Visit: Payer: Self-pay | Admitting: "Endocrinology

## 2019-08-25 DIAGNOSIS — L03032 Cellulitis of left toe: Secondary | ICD-10-CM | POA: Diagnosis not present

## 2019-08-25 DIAGNOSIS — B351 Tinea unguium: Secondary | ICD-10-CM | POA: Diagnosis not present

## 2019-08-25 DIAGNOSIS — L03031 Cellulitis of right toe: Secondary | ICD-10-CM | POA: Diagnosis not present

## 2019-08-26 ENCOUNTER — Ambulatory Visit (INDEPENDENT_AMBULATORY_CARE_PROVIDER_SITE_OTHER): Payer: Medicaid Other | Admitting: Family Medicine

## 2019-08-26 ENCOUNTER — Other Ambulatory Visit: Payer: Self-pay

## 2019-08-26 ENCOUNTER — Encounter: Payer: Self-pay | Admitting: Family Medicine

## 2019-08-26 VITALS — BP 168/89 | HR 85 | Temp 97.6°F | Ht 65.0 in | Wt 247.6 lb

## 2019-08-26 DIAGNOSIS — E039 Hypothyroidism, unspecified: Secondary | ICD-10-CM | POA: Diagnosis not present

## 2019-08-26 DIAGNOSIS — J329 Chronic sinusitis, unspecified: Secondary | ICD-10-CM | POA: Diagnosis not present

## 2019-08-26 DIAGNOSIS — K219 Gastro-esophageal reflux disease without esophagitis: Secondary | ICD-10-CM | POA: Diagnosis not present

## 2019-08-26 DIAGNOSIS — I10 Essential (primary) hypertension: Secondary | ICD-10-CM

## 2019-08-26 DIAGNOSIS — E782 Mixed hyperlipidemia: Secondary | ICD-10-CM

## 2019-08-26 MED ORDER — AMOXICILLIN 875 MG PO TABS: 875.0000 mg | ORAL_TABLET | Freq: Two times a day (BID) | ORAL | 0 refills | Status: DC

## 2019-08-26 NOTE — Progress Notes (Signed)
New Patient Office Visit  Subjective:  Patient ID: Sandy Valencia, female    DOB: 02-28-1961  Age: 59 y.o. MRN: [id number] Prescriptions Total Prescriptions: 28   Total Private Pay: 1   Fill Date ID   Written Drug Qty Days Prescriber Rx # Pharmacy Refill   Daily Dose* Pymt Type PMP    08/18/2019  1   07/20/2019  Pregabalin 150 MG Capsule  60.00  30 Ay Pow   [id number]   Rei ([id number])   0  2.01 LME  Medicaid   [place]  08/18/2019  1   07/22/2019  Hydrocodone-Acetamin 7.5-325  80.00  27 Ay Pow   [id number]   Rei ([id number])   0  22.22 MME  Medicaid   [place]  07/20/2019  1   07/14/2019  Hydrocodone-Acetamin 7.5-325  60.00  30 Ay Pow   [id number]   Rei ([id number])   0  15.00 MME  Medicaid   [place]  07/14/2019  1   07/14/2019  Pregabalin 75 MG Capsule  60.00  30 Ay Pow   [id number]   Rei ([id number])   0  1.00 LME  Medicaid   [place]  06/19/2019  1   06/18/2019  Hydrocodone-Acetamin 7.5-325  80.00  27 Ay Pow   [id number]   Rei ([id number])   0  22.22 MME  Medicaid   [place]  06/18/2019  1   06/18/2019  Pregabalin 150 MG Capsule  60.00  30 Ay Pow   [id number]   Rei ([id number])   0  2.01 LME  Medicaid   [place]  05/20/2019  1   05/18/2019  Hydrocodone-Acetamin 7.5-325  60.00  30 Ay Pow   [id number]   Rei ([id number])   0  15.00 MME  Medicaid   [place]  05/18/2019  1   05/18/2019  Pregabalin 75 MG Capsule  60.00  30 Ay Pow   [id number]   Rei ([id number])   0  1.00 LME  Medicaid   [place]  04/22/2019  1   04/21/2019  Hydrocodone-Acetamin 7.5-325  60.00  30 Ay Pow   [id number]   [place] ([id number])   0  15.00 MME  Private Pay   [place]  03/23/2019  1   03/23/2019  Hydrocodone-Acetamin 7.5-325  120.00  30 Ed Haw   [id number]   [place] ([id number])   0  30.00 MME  Comm Ins   [place]  02/19/2019  1   02/19/2019  Hydrocodone-Acetamin 7.5-325  120.00  30 Ed Haw   [id number]   [place] ([id number])   0  30.00 MME  Comm Ins   [place]  01/21/2019  1   01/21/2019  Hydrocodone-Acetamin 7.5-325  120.00  30 Ed Haw   [id number]   [place] ([id number])   0  30.00 MME  Comm Ins   [place]  12/22/2018  1   12/22/2018  Hydrocodone-Acetamin 7.5-325  120.00  30 Ed Haw   [id number]   [place] ([id number])    0  30.00 MME  Comm Ins   [place]  11/20/2018  1   11/20/2018  Hydrocodone-Acetamin 7.5-325  120.00  30 Ed Haw   [id number]   [place] ([id number])   0  30.00 MME  Comm Ins   [place]  10/23/2018  1   10/23/2018  Hydrocodone-Acetamin 7.5-325  120.00  30 Ed Haw   [id number]   [place] ([id number])   0  30.00 MME  Comm Ins   [place]  09/19/2018  1   09/19/2018  Hydrocodone-Acetamin 7.5-325  120.00  30  Kathi Ludwig   [id number]   [place] [phone])   0  30.00 MME  Comm Ins   [place]  08/20/2018  1   08/19/2018  Hydrocodone-Acetamin 7.5-325  120.00  30 Ed Haw   [id number]   [place] ([id number])   0  30.00 MME  Comm Ins   [place]  07/22/2018  1   07/22/2018  Hydrocodone-Acetamin 7.5-325  120.00  30 Ed Haw   [id number]   [place] ([id number])   0  30.00 MME  Comm Ins   [place]  06/20/2018  1   06/19/2018  Hydrocodone-Acetamin 7.5-325  120.00  30 Ed Haw   [id number]   [place] ([id number])   0  30.00 MME  Comm Ins   [place]  05/22/2018  1   05/20/2018  Hydrocodone-Acetamin 7.5-325  120.00  30 Ed Haw   [id number]   [place] ([id number])   0  30.00 MME  Comm Ins   [place]  04/21/2018  1   02/17/2018  Hydrocodone-Acetamin 7.5-325  120.00  30 Ed Haw   [id number]   [place] ([id number])   0  30.00 MME  Comm Ins     03/22/2018  1   02/17/2018  Hydrocodone-Acetamin 7.5-325  120.00  30 Ed Haw   [id number]   [place] ([id number])   0        CC:  Chief Complaint  Patient presents with  . Follow-up    3 month f/u   . Sinusitis    sinus pain and pressure. need something for sinus infection   DM  HPI Sandy Valencia presents for sinus congestion-pt seen at urgent care -took mucinex-antibiotic-took after having teeth extracted.   DM-endo following HTN--Lisinopril 5mg -started by neuro-bp at home readings 3pm yesterday 138/68 Hyperlipidemia-zocor -no recent lipid panel-pending GERD-protonix B12 def-cyanocobalamin Hypothyroid-synthroid-TSH elevated -repeat pending Asthma-ventolin Vit D def-blood work pending Arthritis-takes pain medication-sees neuro-Lyrica/Hydrocodone/Voltaren Past Medical History:  Diagnosis Date  . Allergic rhinitis due to pollen   .  Anaphylactic shock, unspecified, subsequent encounter   . Angio-edema   . Arthritis   . Asthma   . Asymptomatic varicose veins of right lower extremity   . Chronic obstructive pulmonary disease with (acute) exacerbation ([place])   . COVID-19   . Diabetes mellitus   . Essential (primary) hypertension   . Fatty liver   . Gastro-esophageal reflux disease without esophagitis   . GERD (gastroesophageal reflux disease)   . Hypercholesteremia   . Hypertension   . Hypothyroidism   . Low back pain   . Obesity, unspecified   . Other adverse food reactions, not elsewhere classified, initial encounter   . Rash and other nonspecific skin eruption   . Sleep apnea    pt siad, "i had small amount but could not tolerate CPAP and they said it was ok since it was mild.  . Sleep apnea, unspecified   . Unspecified asthma, uncomplicated   . Zoster without complications     Past Surgical History:  Procedure Laterality Date  . ABDOMINAL HYSTERECTOMY    . CHOLECYSTECTOMY    . COLONOSCOPY N/A 09/22/2012   RMR: colonic polyps -removed as described above. tubular adenoma, next TCS 09/2017  . COLONOSCOPY WITH PROPOFOL N/A 09/23/2017   Procedure: COLONOSCOPY WITH PROPOFOL;  Surgeon: Daneil Dolin, MD;  Location: [facility];  Service: Endoscopy;  Laterality: N/A;  7:30am  . CYST EXCISION Left 07/19/2016   Procedure: CYST REMOVAL LEFT RING FINGER;  Surgeon: Carole Civil, MD;  Location: [facility] ORS;  Service: Orthopedics;  Laterality: Left;  . CYST REMOVAL LEG Right    foot  . ESOPHAGOGASTRODUODENOSCOPY N/A 10/21/2014   RMR: Small hiatal hernia; otherwise normal EGD status post passage of a Maloney dilator.   Marland Kitchen EXCISION MASS UPPER EXTREMETIES Right 04/17/2018   Procedure: EXCISION MASS UPPER EXTREMETIES right ring finger;  Surgeon: Carole Civil, MD;  Location: [facility];  Service: Orthopedics;  Laterality: Right;  . MALONEY DILATION N/A 10/21/2014   Procedure: [name] DILATION;  Surgeon: Daneil Dolin,  MD;  Location: [facility];  Service: Endoscopy;  Laterality: N/A;  . MIDDLE EAR SURGERY Right    patched hole in ear drum  . POLYPECTOMY  09/23/2017   Procedure: POLYPECTOMY;  Surgeon: Daneil Dolin, MD;  Location: [facility];  Service: Endoscopy;;  polyp hepatic flexure polyp cs, splenic flexure polyp cs, rectal polyp cs    Family History  Problem Relation Age of Onset  . Colon cancer Mother        diagnosed age 66, underwent surgical resection, metastatic disease several years later  . Diabetes Mother   . Heart attack Father   . Diabetes Sister   . Hypothyroidism Brother   . Diabetes Sister   . Diabetes Sister   . Diabetes Sister   . Hypothyroidism Sister   . Allergic rhinitis Neg Hx   . Angioedema Neg Hx   . Asthma Neg Hx   . Atopy Neg Hx   . Eczema Neg Hx   . Immunodeficiency Neg Hx   . Urticaria Neg Hx     Social History   Socioeconomic History  . Marital status: Divorced    Spouse name: Not on file  . Number of children: Not on file  . Years of education: Not on file  . Highest education level: Not on file  Occupational History  . Not on file  Tobacco Use  . Smoking status: Never Smoker  . Smokeless tobacco: Never Used  Substance and Sexual Activity  . Alcohol use: No    Alcohol/week: 0.0 standard drinks  . Drug use: No  . Sexual activity: Yes    Birth control/protection: Surgical  Other Topics Concern  . Not on file  Social History Narrative  . Not on file   Social Determinants of Health   Financial Resource Strain:   . Difficulty of Paying Living Expenses:   Food Insecurity:   . Worried About Charity fundraiser in the Last Year:   . Arboriculturist in the Last Year:   Transportation Needs:   . Film/video editor (Medical):   Marland Kitchen Lack of Transportation (Non-Medical):   Physical Activity:   . Days of Exercise per Week:   . Minutes of Exercise per Session:   Stress:   . Feeling of Stress :   Social Connections:   . Frequency of  Communication with Friends and Family:   . Frequency of Social Gatherings with Friends and Family:   . Attends Religious Services:   . Active Member of Clubs or Organizations:   . Attends Archivist Meetings:   Marland Kitchen Marital Status:   Intimate Partner Violence:   . Fear of Current or Ex-Partner:   . Emotionally Abused:   Marland Kitchen Physically Abused:   . Sexually Abused:     ROS Review of Systems  Constitutional: Negative.   HENT: Positive for congestion, hearing loss and sinus pressure. Negative for ear pain.        ENT-hearing loss-surgery -Dr. Melene Plan Sinus  pressure maxillary- Cough productive -mucous "color to it"  Eyes:       Glasses  Respiratory:       Asthma  Gastrointestinal:       GERD  Endocrine:       DM hypothyroid  Musculoskeletal: Positive for arthralgias, back pain, myalgias, neck pain and neck stiffness.  Allergic/Immunologic: Positive for food allergies.  Neurological: Negative for headaches.  Hematological: Negative.   Psychiatric/Behavioral: Negative.     Objective:   Today's Vitals: BP (!) 168/89 (BP Location: Right Arm, Patient Position: Sitting, Cuff Size: Large)   Pulse 85   Temp 97.6 F (36.4 C) (Temporal)   Ht 5\' 5"  (1.651 m)   Wt 247 lb 9.6 oz (112.3 kg)   SpO2 97%   BMI 41.20 kg/m   Physical Exam Constitutional:      Appearance: Normal appearance.  HENT:     Left Ear: Tympanic membrane normal.     Ears:     Comments: Distortion TM right -no eryth, no fluid Eyes:     Conjunctiva/sclera: Conjunctivae normal.  Cardiovascular:     Rate and Rhythm: Normal rate and regular rhythm.     Pulses: Normal pulses.     Heart sounds: Normal heart sounds.  Musculoskeletal:     Cervical back: Normal range of motion and neck supple.  Neurological:     Mental Status: She is alert and oriented to person, place, and time.  Psychiatric:        Mood and Affect: Mood normal.        Behavior: Behavior normal.     Assessment & Plan:   1. Essential  hypertension, benign Elevated in the past-pt now taking lisinopril 5mg -pt will check bp at home-first thing in the morning  2. Gastroesophageal reflux disease, unspecified whether esophagitis present protonix  3. Hypothyroidism, unspecified type Synthroid-TSH pending  4. Mixed hyperlipidemia zocor-lipid panel pending 5. Sinusitis, unspecified chronicity, unspecified location Amoxil-rx Outpatient Encounter Medications as of 08/26/2019  Medication Sig  . Accu-Chek FastClix Lancets MISC USE TO TEST 4 TIMES DAILY.  Marland Kitchen ACCU-CHEK GUIDE test strip USE TO TEST 4 TIMES DAILY.  Marland Kitchen albuterol (VENTOLIN HFA) 108 (90 Base) MCG/ACT inhaler INHALE TWO PUFFS INTO THE LUNGS EVERY SIX HOURS AS NEEDED FOR WHEEZING OR SHORTNESS OF BREATH  . albuterol (VENTOLIN HFA) 108 (90 Base) MCG/ACT inhaler Inhale 4 puffs into the lungs every 6 (six) hours as needed for wheezing or shortness of breath.  Marland Kitchen aspirin EC 81 MG tablet Take 81 mg by mouth daily.  . beclomethasone (QVAR) 80 MCG/ACT inhaler Inhale 2 puffs into the lungs 2 (two) times daily.  . Cholecalciferol (VITAMIN D-3) 1000 UNITS CAPS Take 1,000 Units by mouth daily.   . diclofenac (VOLTAREN) 50 MG EC tablet Take 50 mg by mouth 2 (two) times daily.  Marland Kitchen EPINEPHrine 0.3 mg/0.3 mL IJ SOAJ injection Inject 0.3 mg into the muscle as needed for anaphylaxis.  Marland Kitchen glipiZIDE (GLUCOTROL XL) 5 MG 24 hr tablet Take 1 tablet (5 mg total) by mouth daily with breakfast.  . insulin aspart (NOVOLOG FLEXPEN) 100 UNIT/ML FlexPen Inject 20-26 Units into the skin 3 (three) times daily with meals.  . insulin glargine (LANTUS SOLOSTAR) 100 UNIT/ML Solostar Pen Inject 80 Units into the skin daily.  Marland Kitchen levothyroxine (SYNTHROID) 75 MCG tablet TAKE ONE (1) TABLET BY MOUTH EVERY DAY BEFORE BREAKFAST  . lisinopril (ZESTRIL) 5 MG tablet Take 5 mg by mouth daily.  . metFORMIN (GLUCOPHAGE-XR) 500 MG 24 hr tablet  TAKE ONE TABLET BY MOUTH TWICE A DAY AFTER A MEAL  . pantoprazole (PROTONIX) 40 MG  tablet Take 1 tablet (40 mg total) by mouth daily.  . pregabalin (LYRICA) 150 MG capsule Take 150 mg by mouth 2 (two) times daily.  . Semaglutide,0.25 or 0.5MG /DOS, 2 MG/1.5ML SOPN Inject 0.5 mg into the skin once a week.  . simvastatin (ZOCOR) 10 MG tablet TAKE ONE TABLET (10MG  TOTAL) BY MOUTH DAILY (Patient taking differently: Take 10 mg by mouth daily. )  . vitamin B-12 (CYANOCOBALAMIN) 500 MCG tablet Take 500 mcg by mouth daily.  . [DISCONTINUED] benzonatate (TESSALON) 100 MG capsule Take 2 capsules (200 mg total) by mouth 3 (three) times daily as needed.  . [DISCONTINUED] diclofenac (VOLTAREN) 50 MG EC tablet Take 1 tablet (50 mg total) by mouth 2 (two) times daily.  . [DISCONTINUED] meclizine (ANTIVERT) 25 MG tablet Take 25 mg by mouth 4 (four) times daily.  . [DISCONTINUED] pantoprazole (PROTONIX) 40 MG tablet TAKE ONE (1) TABLET EACH DAY (Patient taking differently: Take 40 mg by mouth daily. )   No facility-administered encounter medications on file as of 08/26/2019.    Follow-up: endo following  Channing Savich Hannah Beat, MD

## 2019-08-26 NOTE — Patient Instructions (Addendum)
mucinex 600mg , Amoxil -take twice a day-pick up medication at [facility] as recommended by endocrinology Check blood pressure readings in the morning-concerning for elevated blood pressure   If you have lab work done today you will be contacted with your lab results within the next 2 weeks.  If you have not heard from [place] then please contact us. The fastest way to get your results is to register for My Chart.   IF you received an x-ray today, you will receive an invoice from [facility]. Please contact [facility] at [phone] with questions or concerns regarding your invoice.   IF you received labwork today, you will receive an invoice from [facility]. Please contact [facility] at [phone] with questions or concerns regarding your invoice.   Our billing staff will not be able to assist you with questions regarding bills from these companies.  You will be contacted with the lab results as soon as they are available. The fastest way to get your results is to activate your My Chart account. Instructions are located on the last page of this paperwork. If you have not heard from [place] regarding the results in 2 weeks, please contact this office.

## 2019-08-27 DIAGNOSIS — H95121 Granulation of postmastoidectomy cavity, right ear: Secondary | ICD-10-CM | POA: Diagnosis not present

## 2019-09-07 DIAGNOSIS — M542 Cervicalgia: Secondary | ICD-10-CM | POA: Insufficient documentation

## 2019-09-07 DIAGNOSIS — M545 Low back pain, unspecified: Secondary | ICD-10-CM | POA: Insufficient documentation

## 2019-09-08 DIAGNOSIS — M79606 Pain in leg, unspecified: Secondary | ICD-10-CM | POA: Insufficient documentation

## 2019-09-08 DIAGNOSIS — M545 Low back pain: Secondary | ICD-10-CM | POA: Diagnosis not present

## 2019-09-08 DIAGNOSIS — I10 Essential (primary) hypertension: Secondary | ICD-10-CM | POA: Diagnosis not present

## 2019-09-08 DIAGNOSIS — Z79891 Long term (current) use of opiate analgesic: Secondary | ICD-10-CM | POA: Diagnosis not present

## 2019-09-08 DIAGNOSIS — M79673 Pain in unspecified foot: Secondary | ICD-10-CM | POA: Diagnosis not present

## 2019-09-08 DIAGNOSIS — G8929 Other chronic pain: Secondary | ICD-10-CM | POA: Diagnosis not present

## 2019-09-08 DIAGNOSIS — M542 Cervicalgia: Secondary | ICD-10-CM | POA: Diagnosis not present

## 2019-09-18 ENCOUNTER — Telehealth: Payer: Self-pay | Admitting: "Endocrinology

## 2019-09-21 NOTE — Telephone Encounter (Signed)
Spoke with Jonni Sanger at [facility] which stated they had received clarification for the novolog.

## 2019-09-21 NOTE — Telephone Encounter (Signed)
Sandy Valencia at [place] needs clarification on her novolog

## 2019-10-09 ENCOUNTER — Other Ambulatory Visit: Payer: Self-pay | Admitting: Family Medicine

## 2019-10-19 ENCOUNTER — Other Ambulatory Visit: Payer: Self-pay | Admitting: "Endocrinology

## 2019-10-22 ENCOUNTER — Ambulatory Visit: Payer: Medicaid Other | Attending: Internal Medicine

## 2019-10-22 DIAGNOSIS — Z23 Encounter for immunization: Secondary | ICD-10-CM

## 2019-10-22 NOTE — Progress Notes (Signed)
   Covid-19 Vaccination Clinic  Name:  Sandy Valencia    MRN: [id number] DOB: July 19, 1960  10/22/2019  Sandy Valencia was observed post Covid-19 immunization for 15 minutes without incident. She was provided with Vaccine Information Sheet and instruction to access the V-Safe system.   Sandy Valencia was instructed to call 911 with any severe reactions post vaccine: Marland Kitchen Difficulty breathing  . Swelling of face and throat  . A fast heartbeat  . A bad rash all over body  . Dizziness and weakness   Immunizations Administered    Name Date Dose VIS Date Route   Moderna COVID-19 Vaccine 10/22/2019 11:21 AM 0.5 mL 05/2019 Intramuscular   Manufacturer: Moderna   Lot: [id number]   [id number]

## 2019-11-05 DIAGNOSIS — Z79891 Long term (current) use of opiate analgesic: Secondary | ICD-10-CM | POA: Diagnosis not present

## 2019-11-05 DIAGNOSIS — M545 Low back pain: Secondary | ICD-10-CM | POA: Diagnosis not present

## 2019-11-05 DIAGNOSIS — M542 Cervicalgia: Secondary | ICD-10-CM | POA: Diagnosis not present

## 2019-11-05 DIAGNOSIS — I10 Essential (primary) hypertension: Secondary | ICD-10-CM | POA: Diagnosis not present

## 2019-11-05 DIAGNOSIS — M79606 Pain in leg, unspecified: Secondary | ICD-10-CM | POA: Diagnosis not present

## 2019-11-09 ENCOUNTER — Other Ambulatory Visit (HOSPITAL_COMMUNITY): Payer: Self-pay | Admitting: Family Medicine

## 2019-11-09 DIAGNOSIS — Z1231 Encounter for screening mammogram for malignant neoplasm of breast: Secondary | ICD-10-CM

## 2019-11-12 ENCOUNTER — Other Ambulatory Visit: Payer: Self-pay | Admitting: Family Medicine

## 2019-11-18 ENCOUNTER — Other Ambulatory Visit: Payer: Self-pay

## 2019-11-18 ENCOUNTER — Encounter: Payer: Self-pay | Admitting: Allergy & Immunology

## 2019-11-18 ENCOUNTER — Ambulatory Visit: Payer: Medicaid Other | Admitting: Allergy & Immunology

## 2019-11-18 ENCOUNTER — Other Ambulatory Visit: Payer: Self-pay | Admitting: Allergy & Immunology

## 2019-11-18 VITALS — BP 142/80 | HR 71 | Resp 18

## 2019-11-18 DIAGNOSIS — J4520 Mild intermittent asthma, uncomplicated: Secondary | ICD-10-CM | POA: Diagnosis not present

## 2019-11-18 DIAGNOSIS — K219 Gastro-esophageal reflux disease without esophagitis: Secondary | ICD-10-CM

## 2019-11-18 DIAGNOSIS — J310 Chronic rhinitis: Secondary | ICD-10-CM | POA: Diagnosis not present

## 2019-11-18 DIAGNOSIS — T7800XD Anaphylactic reaction due to unspecified food, subsequent encounter: Secondary | ICD-10-CM | POA: Diagnosis not present

## 2019-11-18 DIAGNOSIS — IMO0002 Reserved for concepts with insufficient information to code with codable children: Secondary | ICD-10-CM

## 2019-11-18 DIAGNOSIS — T7800XA Anaphylactic reaction due to unspecified food, initial encounter: Secondary | ICD-10-CM

## 2019-11-18 HISTORY — DX: Anaphylactic reaction due to unspecified food, initial encounter: T78.00XA

## 2019-11-18 MED ORDER — LANTUS SOLOSTAR 100 UNIT/ML ~~LOC~~ SOPN: 80.0000 [IU] | PEN_INJECTOR | Freq: Every day | SUBCUTANEOUS | 2 refills | Status: DC

## 2019-11-18 NOTE — Patient Instructions (Addendum)
1. Mild intermittent asthma, uncomplicated - Lung testing looked stable today. - Continue with albuterol 4 puffs every 4-6 hours as needed.   - There is no need for a controller medication at this time.   2. Gastroesophageal reflux disease - Continue with pantoprazole 40mg  daily.  3. 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 - EpiPen refilled today.    4. Return in about 1 year (around 11/17/2020). This can be an in-person, a virtual Webex or a telephone follow up visit.   Please inform us of any Emergency Department visits, hospitalizations, or changes in symptoms. Call us before going to the ED for breathing or allergy symptoms since we might be able to fit you in for a sick visit. Feel free to contact us anytime with any questions, problems, or concerns.  It was a pleasure to see you again today!  Websites that have reliable patient information: 1. American Academy of Asthma, Allergy, and Immunology: www.aaaai.org 2. Food Allergy Research and Education (FARE): foodallergy.org 3. Mothers of Asthmatics: http://www.asthmacommunitynetwork.org 4. American College of Allergy, Asthma, and Immunology: www.acaai.org   COVID-19 Vaccine Information can be found at: ShippingScam.co.uk For questions related to vaccine distribution or appointments, please email [email or web address] or call [phone].     "Like" [place] on Facebook and Instagram for our latest updates!        Make sure you are registered to vote! If you have moved or changed any of your contact information, you will need to get this updated before voting!  In some cases, you MAY be able to register to vote online: [email or web address]

## 2019-11-18 NOTE — Progress Notes (Signed)
FOLLOW UP  Date of Service/Encounter:  11/18/19   Assessment:   Mild intermittent asthma, uncomplicated  Adverse food reaction(walnut, kiwi, seafood)   Gastroesophageal reflux disease- controlled with a pantoprazole  Chronic nonallergic rhinitis - with negative environmental IgE panel in 2019  Plan/Recommendations:   1. Mild intermittent asthma, uncomplicated - Lung testing looked stable today. - Continue with albuterol 4 puffs every 4-6 hours as needed.   - There is no need for a controller medication at this time.   2. Gastroesophageal reflux disease - Continue with pantoprazole 40mg  daily.  3. 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 - EpiPen refilled today.    4. Return in about 1 year (around 11/17/2020). This can be an in-person, a virtual Webex or a telephone follow up visit.  Subjective:   Sandy Valencia is a 59 y.o. female presenting today for follow up of  Chief Complaint  Patient presents with  . Asthma    Sandy Valencia has a history of the following: Patient Active Problem List   Diagnosis Date Noted  . Sinusitis 08/26/2019  . Mild intermittent asthma without complication 03/50/0938  . Gastroesophageal reflux disease 05/21/2018  . Chronic rhinitis 05/21/2018  . Mass of finger, right s/p surgical excision (mucoid cyst) 04/17/18 04/22/2018  . History of colonic polyps 08/26/2017  . Hypothyroidism 04/22/2017  . Mixed hyperlipidemia 12/31/2016  . Mucous cyst of digit of right hand   . Uncontrolled type 2 diabetes mellitus with complication, with long-term current use of insulin ([place]) 05/18/2015  . Essential hypertension, benign 05/18/2015  . Class 2 severe obesity due to excess calories with serious comorbidity and body mass index (BMI) of 38.0 to 38.9 in adult ([place]) 05/18/2015  . Personal history of noncompliance with medical treatment, presenting hazards to health 05/18/2015  . GERD (gastroesophageal reflux disease)  02/14/2015  . Anemia, iron deficiency 02/14/2015  . Fatty liver 02/14/2015  . Hiatal hernia   . Dysphagia, pharyngoesophageal phase   . Dysphagia 10/11/2014  . Dyspepsia 10/11/2014  . Family history of colon cancer 09/03/2012    History obtained from: chart review and patient.  Sandy Valencia is a 59 y.o. female presenting for a follow up visit.  She was last seen in December 2020.  At that time, we deferred lung testing.  We continue with albuterol 4 puffs every 4-6 hours as needed.  For her reflux, we continued with pantoprazole 40 mg daily.  We recommended continued avoidance of kiwi and walnuts.  We also refilled her EpiPen.  Since last visit, she reports that she has mostly done well. She spends a lot of time bragging about her grandson, for whom she is the primary caregiver. He apparently is going to be getting a 4th championship ring for varsity football. He is also getting recruited by many colleges for both his academic and his sports aptitude.   Asthma/Respiratory Symptom History: She has not needed the inhaler in quite some time. She estimates that it was weeks or months ago. She typically only needs it when she gets sick. Other than that. she does not take it at all.   Allergic Rhinitis Symptom History: She did have amoxicillin erarlier this spring for a sinus infection.   Food Allergy Symptom History: She continues to avoid walnuts and kiwi.    GERD Symptom History: She remains on her Protonix. She has not tried getting off of it.   She is getting her second Moderna vaccine tomorrow. She tolerated the first  one without a problem. Her son (Julius's father) was recently let out of prison. He is living with her for now but does not have any kind of relationship with Rolan Bucco. Her son did get a job Musician.   Otherwise, there have been no changes to her past medical history, surgical history, family history, or social history.    Review of Systems  Constitutional: Negative.   Negative for chills, fever, malaise/fatigue and weight loss.  HENT: Positive for congestion. Negative for ear discharge, ear pain, sinus pain and sore throat.   Eyes: Negative for pain, discharge and redness.  Respiratory: Negative for cough, sputum production, shortness of breath and wheezing.   Cardiovascular: Negative.  Negative for chest pain and palpitations.  Gastrointestinal: Negative for abdominal pain, constipation, diarrhea, heartburn, nausea and vomiting.  Skin: Negative.  Negative for itching and rash.  Neurological: Negative for dizziness and headaches.  Endo/Heme/Allergies: Positive for environmental allergies. Does not bruise/bleed easily.       Objective:   Blood pressure (!) 142/80, pulse 71, resp. rate 18, SpO2 97 %. There is no height or weight on file to calculate BMI.   Physical Exam:  Physical Exam  Constitutional: She appears well-developed.  Pleasant female. Cooperative with the exam.   HENT:  Head: Normocephalic and atraumatic.  Right Ear: Tympanic membrane, external ear and ear canal normal.  Left Ear: Tympanic membrane, external ear and ear canal normal.  Nose: No mucosal edema, rhinorrhea, nasal deformity or septal deviation. Right sinus exhibits no maxillary sinus tenderness and no frontal sinus tenderness. Left sinus exhibits no maxillary sinus tenderness and no frontal sinus tenderness.  Mouth/Throat: Uvula is midline. Mucous membranes are not pale and not dry.  Eyes: Pupils are equal, round, and reactive to light. Conjunctivae are normal. Right eye exhibits no chemosis and no discharge. Left eye exhibits no chemosis and no discharge. Right conjunctiva is not injected. Left conjunctiva is not injected.  Cardiovascular: Normal rate, regular rhythm and normal heart sounds.  Respiratory: Effort normal and breath sounds normal. No accessory muscle usage. No tachypnea. No respiratory distress. She has no wheezes. She has no rhonchi. She has no rales. She  exhibits no tenderness.  Lymphadenopathy:    She has no cervical adenopathy.  Neurological: She is alert.  Skin: No abrasion, no petechiae and no rash noted. Rash is not papular, not vesicular and not urticarial. No erythema. No pallor.     Diagnostic studies:    Spirometry: results abnormal (FEV1: 2.18/75%, FVC: 2.55/68%, FEV1/FVC: 85%).    Spirometry consistent with possible restrictive disease.   Allergy Studies: none        Salvatore Marvel, MD  [facility] [place] [facility] [place]

## 2019-11-19 ENCOUNTER — Ambulatory Visit: Payer: Medicaid Other | Attending: Internal Medicine

## 2019-11-19 DIAGNOSIS — Z23 Encounter for immunization: Secondary | ICD-10-CM

## 2019-11-19 NOTE — Progress Notes (Signed)
   Covid-19 Vaccination Clinic  Name:  Sandy Valencia    MRN: [id number] DOB: Oct 13, 1960  11/19/2019  Sandy Valencia was observed post Covid-19 immunization for 15 minutes without incident. She was provided with Vaccine Information Sheet and instruction to access the V-Safe system.   Sandy Valencia was instructed to call 911 with any severe reactions post vaccine: Marland Kitchen Difficulty breathing  . Swelling of face and throat  . A fast heartbeat  . A bad rash all over body  . Dizziness and weakness   Immunizations Administered    Name Date Dose VIS Date Route   Moderna COVID-19 Vaccine 11/19/2019 10:55 AM 0.5 mL 05/2019 Intramuscular   Manufacturer: Moderna   Lot: [id number]   [place]: [phone]

## 2019-11-19 NOTE — Progress Notes (Signed)
   Covid-19 Vaccination Clinic  Name:  Sandy Valencia    MRN: [id number] DOB: 25-Mar-1961  11/19/2019  [name] was observed post Covid-19 immunization for 30 minutes based on pre-vaccination screening without incident. She was provided with Vaccine Information Sheet and instruction to access the V-Safe system.   [name] was instructed to call 911 with any severe reactions post vaccine: Marland Kitchen Difficulty breathing  . Swelling of face and throat  . A fast heartbeat  . A bad rash all over body  . Dizziness and weakness   Immunizations Administered    Name Date Dose VIS Date Route   Moderna COVID-19 Vaccine 11/19/2019 10:55 AM 0.5 mL 05/2019 Intramuscular   Manufacturer: Moderna   Lot: [id number]   [place]: [id number]

## 2019-11-27 ENCOUNTER — Ambulatory Visit: Payer: Medicaid Other | Admitting: "Endocrinology

## 2019-12-02 ENCOUNTER — Other Ambulatory Visit: Payer: Self-pay

## 2019-12-02 ENCOUNTER — Encounter: Payer: Self-pay | Admitting: Family Medicine

## 2019-12-02 ENCOUNTER — Ambulatory Visit: Payer: Medicaid Other | Admitting: Family Medicine

## 2019-12-02 DIAGNOSIS — E1165 Type 2 diabetes mellitus with hyperglycemia: Secondary | ICD-10-CM

## 2019-12-02 DIAGNOSIS — I10 Essential (primary) hypertension: Secondary | ICD-10-CM | POA: Diagnosis not present

## 2019-12-02 DIAGNOSIS — E782 Mixed hyperlipidemia: Secondary | ICD-10-CM

## 2019-12-02 DIAGNOSIS — E118 Type 2 diabetes mellitus with unspecified complications: Secondary | ICD-10-CM | POA: Diagnosis not present

## 2019-12-02 DIAGNOSIS — Z794 Long term (current) use of insulin: Secondary | ICD-10-CM | POA: Diagnosis not present

## 2019-12-02 DIAGNOSIS — IMO0002 Reserved for concepts with insufficient information to code with codable children: Secondary | ICD-10-CM

## 2019-12-02 DIAGNOSIS — M8949 Other hypertrophic osteoarthropathy, multiple sites: Secondary | ICD-10-CM

## 2019-12-02 DIAGNOSIS — E039 Hypothyroidism, unspecified: Secondary | ICD-10-CM | POA: Diagnosis not present

## 2019-12-02 DIAGNOSIS — Z6841 Body Mass Index (BMI) 40.0 and over, adult: Secondary | ICD-10-CM | POA: Diagnosis not present

## 2019-12-02 DIAGNOSIS — M159 Polyosteoarthritis, unspecified: Secondary | ICD-10-CM

## 2019-12-02 MED ORDER — DICLOFENAC SODIUM 1 % EX GEL: 2.0000 g | Freq: Two times a day (BID) | CUTANEOUS | 0 refills | Status: DC | PRN

## 2019-12-02 NOTE — Assessment & Plan Note (Signed)
Continue Zocor, heart healthy diet encouraged

## 2019-12-02 NOTE — Assessment & Plan Note (Signed)
Linked to HTN and DM   Encouraged diet and lifestyle changes.   Wt Readings from Last 3 Encounters:  12/02/19 246 lb 1.9 oz (111.6 kg)  08/26/19 247 lb 9.6 oz (112.3 kg)  07/30/19 247 lb 12.8 oz (112.4 kg)

## 2019-12-02 NOTE — Assessment & Plan Note (Signed)
Reports she does not have this, but is on the medication for it.

## 2019-12-02 NOTE — Assessment & Plan Note (Signed)
Followed closely by Endo-Dr Dorris Fetch Encouraged to continue his current treatment plans. On a statin and ACE

## 2019-12-02 NOTE — Patient Instructions (Addendum)
I appreciate the opportunity to provide you with care for your health and wellness. Today we discussed: established care   Follow up: Nov    No labs or referrals today  Nice to meet you today :)  Please continue to practice social distancing to keep you, your family, and our community safe.  If you must go out, please wear a mask and practice good handwashing.  It was a pleasure to see you and I look forward to continuing to work together on your health and well-being. Please do not hesitate to call the office if you need care or have questions about your care.  Have a wonderful day and week. With Gratitude, Cherly Beach, DNP, AGNP-BC

## 2019-12-02 NOTE — Assessment & Plan Note (Signed)
Reports being told she has arthritis in several joints, mostly her knees. Needs refill of voltaren gel today.

## 2019-12-02 NOTE — Assessment & Plan Note (Signed)
Elevated today, reports this is her normal even on medication. Decline increase in medication. Encouraged DASH diet and exercise

## 2019-12-02 NOTE — Progress Notes (Signed)
Subjective:  Patient ID: Sandy Valencia, female    DOB: Aug 18, 1960  Age: 59 y.o. MRN: [id number]  CC:  Chief Complaint  Patient presents with  . New Patient (Initial Visit)    New Pt was former dr Holly Valencia pt everything is fine       HPI  HPI Sandy Valencia is a 59 year old female patient who presents today to establish care.  Previous primary care providers included Sandy Valencia and Sandy Valencia.  She is followed closely by endocrinology by Sandy Valencia. She has a history that includes but is not limited to diabetes, hypertension, fatty liver, arthritis, asthma, hypothyroidism among others.  She reports taking all her medications as directed without any issue.  She is up-to-date on her colonoscopy.  She has her mammogram coming up in August.  She sees her eye doctor in September.  She is up-to-date on her vaccines.  She reports that Sandy Valencia is drawn some labs for his next appointment coming up next month.  She denies having any other issues or concerns today.  Needs refill of her Voltaren gel which she uses for arthritis but overall is doing good.  Today patient denies signs and symptoms of COVID 19 infection including fever, chills, cough, shortness of breath, and headache. Past Medical, Surgical, Social History, Allergies, and Medications have been Reviewed.   Past Medical History:  Diagnosis Date  . Allergic rhinitis due to pollen   . 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 Anaphylactic shock, unspecified, subsequent encounter   . Anemia, iron deficiency 02/14/2015  . Angio-edema   . Arthritis   . Asthma   . Asymptomatic varicose veins of right lower extremity   . Chronic obstructive pulmonary disease with (acute) exacerbation ([place])   . Chronic rhinitis 05/21/2018  . COVID-19   . Diabetes mellitus   . Essential (primary) hypertension   . Family history of colon cancer 09/03/2012  . Fatty liver   . Gastro-esophageal reflux disease without esophagitis   . GERD  (gastroesophageal reflux disease)   . History of colonic polyps 08/26/2017  . Hypercholesteremia   . Hypertension   . Hypothyroidism   . Low back pain   . Mass of finger, right s/p surgical excision (mucoid cyst) 04/17/18 04/22/2018  . Mucous cyst of digit of right hand   . Obesity, unspecified   . Other adverse food reactions, not elsewhere classified, initial encounter   . Personal history of noncompliance with medical treatment, presenting hazards to health 05/18/2015  . Rash and other nonspecific skin eruption   . Sleep apnea    pt siad, "i had small amount but could not tolerate CPAP and they said it was ok since it was mild.  . Sleep apnea, unspecified   . Unspecified asthma, uncomplicated   . Zoster without complications     Current Meds  Medication Sig  . Accu-Chek FastClix Lancets MISC USE TO TEST 4 TIMES DAILY.  Marland Kitchen ACCU-CHEK GUIDE test strip USE TO TEST 4 TIMES DAILY.  Marland Kitchen albuterol (VENTOLIN HFA) 108 (90 Base) MCG/ACT inhaler Inhale 4 puffs into the lungs every 6 (six) hours as needed for wheezing or shortness of breath.  Marland Kitchen aspirin EC 81 MG tablet Take 81 mg by mouth daily.  . Cholecalciferol (VITAMIN D-3) 1000 UNITS CAPS Take 1,000 Units by mouth daily.   Marland Kitchen EPINEPHrine 0.3 mg/0.3 mL IJ SOAJ injection Inject 0.3 mg into the muscle as needed for anaphylaxis.  Marland Kitchen glipiZIDE (GLUCOTROL XL)  5 MG 24 hr tablet Take 1 tablet (5 mg total) by mouth daily with breakfast.  . HYDROcodone-acetaminophen (NORCO) 10-325 MG tablet Take 1 tablet by mouth every 6 (six) hours as needed.  . insulin glargine (LANTUS SOLOSTAR) 100 UNIT/ML Solostar Pen Inject 80 Units into the skin daily.  Marland Kitchen levothyroxine (SYNTHROID) 75 MCG tablet TAKE ONE (1) TABLET BY MOUTH EVERY DAY BEFORE BREAKFAST  . lisinopril (ZESTRIL) 5 MG tablet Take 5 mg by mouth daily.  Marland Kitchen LITETOUCH PEN NEEDLES 31G X 8 MM MISC USE AS DIRECTED FOURNTIMES DAILY.  . metFORMIN (GLUCOPHAGE-XR) 500 MG 24 hr tablet TAKE ONE TABLET BY MOUTH TWICE A  DAY AFTER A MEAL  . NOVOLOG FLEXPEN 100 UNIT/ML FlexPen INJECT 20 UNITS INTO THE SKIN 3 TIMES A DAY WITH MEALS  . pantoprazole (PROTONIX) 40 MG tablet TAKE ONE TABLET (40MG  TOTAL) BY MOUTH DAILY  . pregabalin (LYRICA) 150 MG capsule Take 150 mg by mouth 2 (two) times daily.  . Semaglutide,0.25 or 0.5MG /DOS, 2 MG/1.5ML SOPN Inject 0.5 mg into the skin once a week.  . simvastatin (ZOCOR) 10 MG tablet TAKE ONE (1) TABLET BY MOUTH EVERY DAY  . vitamin B-12 (CYANOCOBALAMIN) 500 MCG tablet Take 500 mcg by mouth daily.  . [DISCONTINUED] diclofenac (VOLTAREN) 50 MG EC tablet Take 50 mg by mouth 2 (two) times daily.    ROS:  Review of Systems  Constitutional: Negative.   HENT: Negative.   Eyes: Negative.   Respiratory: Negative.   Cardiovascular: Negative.   Gastrointestinal: Negative.   Genitourinary: Negative.   Musculoskeletal: Negative.   Skin: Negative.   Neurological: Negative.   Endo/Heme/Allergies: Negative.   Psychiatric/Behavioral: Negative.   All other systems reviewed and are negative.    Objective:   Today's Vitals: BP (!) 158/84 (BP Location: Right Arm, Patient Position: Sitting, Cuff Size: Normal)   Pulse 90   Temp (!) 97.4 F (36.3 C) (Temporal)   Resp 16   Ht 5\' 5"  (1.651 m)   Wt 246 lb 1.9 oz (111.6 kg)   SpO2 97%   BMI 40.96 kg/m  Vitals with BMI 12/02/2019 11/18/2019 08/26/2019  Height 5\' 5"  - 5\' 5"   Weight 246 lbs 2 oz - 247 lbs 10 oz  BMI 76.19 - 50.9  Systolic 326 712 458  Diastolic 84 80 89  Pulse 90 71 85     Physical Exam Vitals and nursing note reviewed.  Constitutional:      Appearance: Normal appearance. She is well-developed and well-groomed. She is morbidly obese.  HENT:     Head: Normocephalic and atraumatic.     Right Ear: External ear normal.     Left Ear: External ear normal.     Mouth/Throat:     Comments: Mask in place   Eyes:     General:        Right eye: No discharge.        Left eye: No discharge.     Conjunctiva/sclera:  Conjunctivae normal.  Cardiovascular:     Rate and Rhythm: Normal rate and regular rhythm.     Pulses: Normal pulses.     Heart sounds: Normal heart sounds.  Pulmonary:     Effort: Pulmonary effort is normal.     Breath sounds: Normal breath sounds.  Musculoskeletal:        General: Normal range of motion.     Cervical back: Normal range of motion and neck supple.  Skin:    General: Skin is warm.  Neurological:  General: No focal deficit present.     Mental Status: She is alert and oriented to person, place, and time.  Psychiatric:        Attention and Perception: Attention normal.        Mood and Affect: Mood normal.        Speech: Speech normal.        Behavior: Behavior is hyperactive. Behavior is cooperative.        Thought Content: Thought content normal.        Cognition and Memory: Cognition normal.        Judgment: Judgment normal.      Assessment   1. Morbid obesity with body mass index (BMI) of 40.0 to 44.9 in adult [facility])   2. Uncontrolled type 2 diabetes mellitus with complication, with long-term current use of insulin ([place])   3. Essential hypertension, benign   4. Hypothyroidism, unspecified type   5. Mixed hyperlipidemia   6. Primary osteoarthritis involving multiple joints     Tests ordered No orders of the defined types were placed in this encounter.    Plan: Please see assessment and plan per problem list above.   Meds ordered this encounter  Medications  . diclofenac Sodium (VOLTAREN) 1 % GEL    Sig: Apply 2 g topically 2 (two) times daily as needed.    Dispense:  50 g    Refill:  0    Order Specific Question:   Supervising Provider    Answer:   Fayrene Helper [[id number]]    Patient to follow-up in 6 months .  Perlie Mayo, NP

## 2019-12-14 ENCOUNTER — Telehealth: Payer: Self-pay | Admitting: Family Medicine

## 2019-12-14 NOTE — Telephone Encounter (Signed)
Last filled 12/02/19

## 2019-12-14 NOTE — Telephone Encounter (Signed)
Pt is needing a refill on her Voltaren Rx.

## 2019-12-15 ENCOUNTER — Other Ambulatory Visit: Payer: Self-pay | Admitting: Family Medicine

## 2019-12-15 ENCOUNTER — Telehealth: Payer: Self-pay | Admitting: Family Medicine

## 2019-12-15 ENCOUNTER — Other Ambulatory Visit: Payer: Self-pay | Admitting: *Deleted

## 2019-12-15 DIAGNOSIS — M159 Polyosteoarthritis, unspecified: Secondary | ICD-10-CM

## 2019-12-15 MED ORDER — DICLOFENAC SODIUM 1 % EX GEL: 2.0000 g | Freq: Two times a day (BID) | CUTANEOUS | 0 refills | Status: DC | PRN

## 2019-12-15 NOTE — Telephone Encounter (Signed)
Pt is wanting 3% voltaren gel as 1% doesn't do anything for her

## 2019-12-15 NOTE — Telephone Encounter (Signed)
Patients prescription of 3% arthritis cream was sent to the incorrect pharmacy. She would like the cream sent to [facility] [address] in [place]. Please call pt back to confirm when sent in. [phone]

## 2019-12-15 NOTE — Telephone Encounter (Signed)
I have sent in a refill.  Would like her to make sure she is following up with Ortho so lets make sure that that somewhere in the chart.  If not we should consider referral if she needs a refill again the same.

## 2019-12-15 NOTE — Telephone Encounter (Signed)
Patient last saw Dr.Harrison in 2019 but it was for a mass on her finger.

## 2019-12-15 NOTE — Telephone Encounter (Signed)
Please send this in to the correct pharmacy.

## 2019-12-15 NOTE — Telephone Encounter (Signed)
Noted  

## 2019-12-16 ENCOUNTER — Other Ambulatory Visit: Payer: Self-pay | Admitting: *Deleted

## 2019-12-16 MED ORDER — DICLOFENAC SODIUM 3 % EX GEL: 2.0000 g | Freq: Two times a day (BID) | CUTANEOUS | 0 refills | Status: DC | PRN

## 2019-12-16 NOTE — Telephone Encounter (Signed)
LVM letting pt know that medication has been sent to the pharmacy

## 2019-12-21 ENCOUNTER — Other Ambulatory Visit: Payer: Self-pay | Admitting: "Endocrinology

## 2019-12-21 ENCOUNTER — Other Ambulatory Visit: Payer: Self-pay | Admitting: *Deleted

## 2019-12-21 ENCOUNTER — Telehealth: Payer: Self-pay | Admitting: Family Medicine

## 2019-12-21 MED ORDER — ACCU-CHEK FASTCLIX LANCETS MISC: 0 refills | Status: DC

## 2019-12-21 MED ORDER — ACCU-CHEK GUIDE VI STRP: ORAL_STRIP | 0 refills | Status: DC

## 2019-12-21 NOTE — Telephone Encounter (Signed)
Refill sent to pharmacy for pt 

## 2019-12-21 NOTE — Telephone Encounter (Signed)
Pt needing to get a refill on diabetic testing supplies

## 2019-12-25 DIAGNOSIS — E1165 Type 2 diabetes mellitus with hyperglycemia: Secondary | ICD-10-CM | POA: Diagnosis not present

## 2019-12-25 DIAGNOSIS — E118 Type 2 diabetes mellitus with unspecified complications: Secondary | ICD-10-CM | POA: Diagnosis not present

## 2019-12-25 DIAGNOSIS — Z794 Long term (current) use of insulin: Secondary | ICD-10-CM | POA: Diagnosis not present

## 2019-12-25 DIAGNOSIS — E039 Hypothyroidism, unspecified: Secondary | ICD-10-CM | POA: Diagnosis not present

## 2019-12-26 LAB — MICROALBUMIN / CREATININE URINE RATIO
Creatinine, Urine: 194 mg/dL (ref 20–275)
Microalb Creat Ratio: 6 mcg/mg creat (ref <30)
Microalb, Ur: 1.2 mg/dL

## 2019-12-26 LAB — COMPLETE METABOLIC PANEL WITH GFR
AG Ratio: 1.8 (calc) (ref 1.0–2.5)
ALT: 38 U/L — ABNORMAL HIGH (ref 6–29)
AST: 27 U/L (ref 10–35)
Albumin: 4.4 g/dL (ref 3.6–5.1)
Alkaline phosphatase (APISO): 72 U/L (ref 37–153)
BUN: 13 mg/dL (ref 7–25)
CO2: 27 mmol/L (ref 20–32)
Calcium: 9.6 mg/dL (ref 8.6–10.4)
Chloride: 103 mmol/L (ref 98–110)
Creat: 0.76 mg/dL (ref 0.50–1.05)
GFR, Est African American: 100 mL/min/{1.73_m2} (ref >=60)
GFR, Est Non African American: 86 mL/min/{1.73_m2} (ref >=60)
Globulin: 2.5 g/dL (calc) (ref 1.9–3.7)
Glucose, Bld: 127 mg/dL — ABNORMAL HIGH (ref 65–99)
Potassium: 4.2 mmol/L (ref 3.5–5.3)
Sodium: 139 mmol/L (ref 135–146)
Total Bilirubin: 0.5 mg/dL (ref 0.2–1.2)
Total Protein: 6.9 g/dL (ref 6.1–8.1)

## 2019-12-26 LAB — T4, FREE: Free T4: 1.2 ng/dL (ref 0.8–1.8)

## 2019-12-26 LAB — LIPID PANEL
Cholesterol: 137 mg/dL (ref <200)
HDL: 56 mg/dL (ref >=50)
LDL Cholesterol (Calc): 65 mg/dL (calc)
Non-HDL Cholesterol (Calc): 81 mg/dL (calc) (ref <130)
Total CHOL/HDL Ratio: 2.4 (calc) (ref <5.0)
Triglycerides: 77 mg/dL (ref <150)

## 2019-12-26 LAB — VITAMIN D 25 HYDROXY (VIT D DEFICIENCY, FRACTURES): Vit D, 25-Hydroxy: 28 ng/mL — ABNORMAL LOW (ref 30–100)

## 2019-12-26 LAB — TSH: TSH: 1.16 mIU/L (ref 0.40–4.50)

## 2019-12-31 DIAGNOSIS — I10 Essential (primary) hypertension: Secondary | ICD-10-CM | POA: Diagnosis not present

## 2019-12-31 DIAGNOSIS — M542 Cervicalgia: Secondary | ICD-10-CM | POA: Diagnosis not present

## 2019-12-31 DIAGNOSIS — M545 Low back pain: Secondary | ICD-10-CM | POA: Diagnosis not present

## 2019-12-31 DIAGNOSIS — M79606 Pain in leg, unspecified: Secondary | ICD-10-CM | POA: Diagnosis not present

## 2019-12-31 DIAGNOSIS — Z79891 Long term (current) use of opiate analgesic: Secondary | ICD-10-CM | POA: Diagnosis not present

## 2020-01-01 ENCOUNTER — Encounter: Payer: Self-pay | Admitting: Nurse Practitioner

## 2020-01-01 ENCOUNTER — Ambulatory Visit (INDEPENDENT_AMBULATORY_CARE_PROVIDER_SITE_OTHER): Payer: Medicaid Other | Admitting: Nurse Practitioner

## 2020-01-01 ENCOUNTER — Other Ambulatory Visit: Payer: Self-pay

## 2020-01-01 VITALS — BP 151/81 | HR 83 | Ht 65.0 in | Wt 253.4 lb

## 2020-01-01 DIAGNOSIS — E782 Mixed hyperlipidemia: Secondary | ICD-10-CM

## 2020-01-01 DIAGNOSIS — I10 Essential (primary) hypertension: Secondary | ICD-10-CM | POA: Diagnosis not present

## 2020-01-01 DIAGNOSIS — E118 Type 2 diabetes mellitus with unspecified complications: Secondary | ICD-10-CM | POA: Diagnosis not present

## 2020-01-01 DIAGNOSIS — E1165 Type 2 diabetes mellitus with hyperglycemia: Secondary | ICD-10-CM | POA: Diagnosis not present

## 2020-01-01 DIAGNOSIS — E039 Hypothyroidism, unspecified: Secondary | ICD-10-CM

## 2020-01-01 DIAGNOSIS — IMO0002 Reserved for concepts with insufficient information to code with codable children: Secondary | ICD-10-CM

## 2020-01-01 DIAGNOSIS — Z794 Long term (current) use of insulin: Secondary | ICD-10-CM

## 2020-01-01 LAB — POCT GLYCOSYLATED HEMOGLOBIN (HGB A1C): Hemoglobin A1C: 8.2 % — AB (ref 4.0–5.6)

## 2020-01-01 MED ORDER — NOVOLOG FLEXPEN 100 UNIT/ML ~~LOC~~ SOPN: 22.0000 [IU] | PEN_INJECTOR | Freq: Three times a day (TID) | SUBCUTANEOUS | 2 refills | Status: DC

## 2020-01-01 MED ORDER — OZEMPIC (1 MG/DOSE) 2 MG/1.5ML ~~LOC~~ SOPN: 1.0000 mg | PEN_INJECTOR | SUBCUTANEOUS | 2 refills | Status: DC

## 2020-01-01 NOTE — Patient Instructions (Signed)

## 2020-01-01 NOTE — Progress Notes (Signed)
01/01/2020   Endocrinology follow-up note   Subjective:    Patient ID: Sandy Valencia, female    DOB: August 01, 1960, PCP Perlie Mayo, NP   Past Medical History:  Diagnosis Date  . Allergic rhinitis due to pollen   . 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 Anaphylactic shock, unspecified, subsequent encounter   . Anemia, iron deficiency 02/14/2015  . Angio-edema   . Arthritis   . Asthma   . Asymptomatic varicose veins of right lower extremity   . Chronic obstructive pulmonary disease with (acute) exacerbation ([place])   . Chronic rhinitis 05/21/2018  . COVID-19   . Diabetes mellitus   . Essential (primary) hypertension   . Family history of colon cancer 09/03/2012  . Fatty liver   . Gastro-esophageal reflux disease without esophagitis   . GERD (gastroesophageal reflux disease)   . History of colonic polyps 08/26/2017  . Hypercholesteremia   . Hypertension   . Hypothyroidism   . Low back pain   . Mass of finger, right s/p surgical excision (mucoid cyst) 04/17/18 04/22/2018  . Mucous cyst of digit of right hand   . Obesity, unspecified   . Other adverse food reactions, not elsewhere classified, initial encounter   . Personal history of noncompliance with medical treatment, presenting hazards to health 05/18/2015  . Rash and other nonspecific skin eruption   . Sleep apnea    pt siad, "i had small amount but could not tolerate CPAP and they said it was ok since it was mild.  . Sleep apnea, unspecified   . Unspecified asthma, uncomplicated   . Zoster without complications    Past Surgical History:  Procedure Laterality Date  . ABDOMINAL HYSTERECTOMY    . CHOLECYSTECTOMY    . COLONOSCOPY N/A 09/22/2012   RMR: colonic polyps -removed as described above. tubular adenoma, next TCS 09/2017  . COLONOSCOPY WITH PROPOFOL N/A 09/23/2017   Procedure: COLONOSCOPY WITH PROPOFOL;  Surgeon: Daneil Dolin, MD;  [facility];  Service: Endoscopy;  Laterality:  N/A;  7:30am  . CYST EXCISION Left 07/19/2016   Procedure: CYST REMOVAL LEFT RING FINGER;  Surgeon: Carole Civil, MD;  Location: [facility];  Service: Orthopedics;  Laterality: Left;  . CYST REMOVAL LEG Right    foot  . ESOPHAGOGASTRODUODENOSCOPY N/A 10/21/2014   RMR: Small hiatal hernia; otherwise normal EGD status post passage of a Maloney dilator.   Marland Kitchen EXCISION MASS UPPER EXTREMETIES Right 04/17/2018   Procedure: EXCISION MASS UPPER EXTREMETIES right ring finger;  Surgeon: Carole Civil, MD;  Location: [facility];  Service: Orthopedics;  Laterality: Right;  . MALONEY DILATION N/A 10/21/2014   Procedure: [name] DILATION;  Surgeon: Daneil Dolin, MD;  Location: [facility];  Service: Endoscopy;  Laterality: N/A;  . MIDDLE EAR SURGERY Right    patched hole in ear drum  . POLYPECTOMY  09/23/2017   Procedure: POLYPECTOMY;  Surgeon: Daneil Dolin, MD;  Location: [facility];  Service: Endoscopy;;  polyp hepatic flexure polyp cs, splenic flexure polyp cs, rectal polyp cs   Social History   Socioeconomic History  . Marital status: Divorced    Spouse name: Not on file  . Number of children: 2  . Years of education: Not on file  . Highest education level: Not on file  Occupational History  . Not on file  Tobacco Use  . Smoking status: Never Smoker  . Smokeless tobacco: Never Used  Vaping Use  .  Vaping Use: Never used  Substance and Sexual Activity  . Alcohol use: No    Alcohol/week: 0.0 standard drinks  . Drug use: No  . Sexual activity: Yes    Birth control/protection: Surgical  Other Topics Concern  . Not on file  Social History Narrative   Lives with grandson (since born)      Enjoys: vacation, travel, concerts      Diet: eats all food groups    Caffeine: diet Pepsi daily    Water:  8 cups daily       Wears seat belt    Does use phone   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    Social Determinants of Health   Financial Resource Strain: Low Risk   .  Difficulty of Paying Living Expenses: Not hard at all  Food Insecurity: No Food Insecurity  . Worried About Charity fundraiser in the Last Year: Never true  . Ran Out of Food in the Last Year: Never true  Transportation Needs: No Transportation Needs  . Lack of Transportation (Medical): No  . Lack of Transportation (Non-Medical): No  Physical Activity: Insufficiently Active  . Days of Exercise per Week: 2 days  . Minutes of Exercise per Session: 20 min  Stress:   . Feeling of Stress :   Social Connections: Moderately Isolated  . Frequency of Communication with Friends and Family: More than three times a week  . Frequency of Social Gatherings with Friends and Family: More than three times a week  . Attends Religious Services: More than 4 times per year  . Active Member of Clubs or Organizations: No  . Attends Archivist Meetings: Never  . Marital Status: Divorced   Outpatient Encounter Medications as of 01/01/2020  Medication Sig  . Accu-Chek FastClix Lancets MISC USE TO TEST 4 TIMES DAILY.  Marland Kitchen albuterol (VENTOLIN HFA) 108 (90 Base) MCG/ACT inhaler Inhale 4 puffs into the lungs every 6 (six) hours as needed for wheezing or shortness of breath.  Marland Kitchen aspirin EC 81 MG tablet Take 81 mg by mouth daily.  . Cholecalciferol (VITAMIN D-3) 1000 UNITS CAPS Take 1,000 Units by mouth daily.   . diclofenac Sodium (VOLTAREN) 1 % GEL Apply 2 g topically 2 (two) times daily as needed.  . Diclofenac Sodium 3 % GEL Apply 2 g topically 2 (two) times daily as needed.  Marland Kitchen EPINEPHrine 0.3 mg/0.3 mL IJ SOAJ injection Inject 0.3 mg into the muscle as needed for anaphylaxis.  Marland Kitchen glipiZIDE (GLUCOTROL XL) 5 MG 24 hr tablet TAKE ONE TABLET (5MG  TOTAL) BY MOUTH DAILY WITH BREAKFAST  . glucose blood (ACCU-CHEK GUIDE) test strip USE TO TEST 4 TIMES DAILY.  Marland Kitchen HYDROcodone-acetaminophen (NORCO) 10-325 MG tablet Take 1 tablet by mouth every 6 (six) hours as needed.  . insulin aspart (NOVOLOG FLEXPEN) 100 UNIT/ML  FlexPen Inject 22-28 Units into the skin 3 (three) times daily with meals.  . insulin glargine (LANTUS SOLOSTAR) 100 UNIT/ML Solostar Pen Inject 80 Units into the skin daily.  Marland Kitchen levothyroxine (SYNTHROID) 75 MCG tablet TAKE ONE (1) TABLET BY MOUTH EVERY DAY BEFORE BREAKFAST  . lisinopril (ZESTRIL) 5 MG tablet Take 5 mg by mouth daily.  Marland Kitchen LITETOUCH PEN NEEDLES 31G X 8 MM MISC USE AS DIRECTED FOURNTIMES DAILY.  . metFORMIN (GLUCOPHAGE-XR) 500 MG 24 hr tablet TAKE ONE TABLET BY MOUTH TWICE A DAY AFTER A MEAL  . pantoprazole (PROTONIX) 40 MG tablet TAKE ONE TABLET (40MG  TOTAL) BY  MOUTH DAILY  . pregabalin (LYRICA) 150 MG capsule Take 150 mg by mouth 2 (two) times daily.  . simvastatin (ZOCOR) 10 MG tablet TAKE ONE (1) TABLET BY MOUTH EVERY DAY  . vitamin B-12 (CYANOCOBALAMIN) 500 MCG tablet Take 500 mcg by mouth daily.  . [DISCONTINUED] NOVOLOG FLEXPEN 100 UNIT/ML FlexPen INJECT 20 UNITS INTO THE SKIN 3 TIMES A DAY WITH MEALS  . [DISCONTINUED] Semaglutide,0.25 or 0.5MG /DOS, 2 MG/1.5ML SOPN Inject 0.5 mg into the skin once a week.  . Semaglutide, 1 MG/DOSE, (OZEMPIC, 1 MG/DOSE,) 2 MG/1.5ML SOPN Inject 0.75 mLs (1 mg total) into the skin once a week.   No facility-administered encounter medications on file as of 01/01/2020.   ALLERGIES: Allergies  Allergen Reactions  . Kiwi Extract Anaphylaxis, Swelling and Palpitations  . Other Anaphylaxis    Walnuts (avoids all tree nuts)   VACCINATION STATUS: Immunization History  Administered Date(s) Administered  . Influenza,inj,Quad PF,6+ Mos 03/30/2019  . Influenza-Unspecified 04/17/2011, 03/24/2012, 05/12/2013, 02/23/2015, 04/04/2017, 04/08/2018  . Moderna SARS-COVID-2 Vaccination 10/22/2019, 11/19/2019    Diabetes She presents for her follow-up diabetic visit. She has type 2 diabetes mellitus. Her disease course has been improving (She was diagnosed at approximate age of 56 years.). There are no hypoglycemic associated symptoms. Pertinent negatives  for hypoglycemia include no confusion, pallor or seizures. Associated symptoms include polydipsia and polyuria. Pertinent negatives for diabetes include no fatigue, no polyphagia and no weight loss. There are no hypoglycemic complications. Symptoms are improving. There are no diabetic complications. Risk factors for coronary artery disease include dyslipidemia, diabetes mellitus, hypertension, sedentary lifestyle and obesity. Current diabetic treatment includes insulin injections and oral agent (dual therapy). She is compliant with treatment some of the time. Her weight is fluctuating minimally. She is following a generally unhealthy diet. When asked about meal planning, she reported none. She has not had a previous visit with a dietitian (She does not feel like she would benefit from setting with dietitian.). She never participates in exercise. Her home blood glucose trend is fluctuating minimally. Her breakfast blood glucose range is generally 130-140 mg/dl. Her lunch blood glucose range is generally 180-200 mg/dl. Her dinner blood glucose range is generally >200 mg/dl. Her bedtime blood glucose range is generally >200 mg/dl. Her overall blood glucose range is 180-200 mg/dl. (Patient presents today with her meter and logs showing near target fasting glycemic profile and still above target postprandial glycemic profile.  According to her logs she has been injecting her prandial insulin 4 times a day at breakfast, lunch, snack time, and supper despite instructions to only do it 3 times a day with meals at last visit.  Her A1c today is 8.2% overall improving slightly from her last visit at 8.6%.) An ACE inhibitor/angiotensin II receptor blocker is being taken. She sees a podiatrist.Eye exam is current.  Hyperlipidemia This is a chronic problem. The current episode started more than 1 year ago. The problem is controlled. Recent lipid tests were reviewed and are normal. Exacerbating diseases include diabetes,  hypothyroidism and obesity. Pertinent negatives include no myalgias. Current antihyperlipidemic treatment includes statins. The current treatment provides significant improvement of lipids. Compliance problems include medication cost.  Risk factors for coronary artery disease include diabetes mellitus, dyslipidemia, hypertension, obesity, a sedentary lifestyle and post-menopausal.     Review of systems  Constitutional: + Minimally fluctuating body weight,  current  Body mass index is 42.17 kg/m. , no fatigue, no subjective hyperthermia, no subjective hypothermia Eyes: no blurry vision, no xerophthalmia ENT: no  sore throat, no nodules palpated in throat, no dysphagia/odynophagia, no hoarseness Cardiovascular: no Chest Pain, no Shortness of Breath, no palpitations, no leg swelling Respiratory: no cough, no shortness of breath Gastrointestinal: no Nausea/Vomiting/Diarhhea Musculoskeletal: no muscle/joint aches Skin: no rashes, no hyperemia Neurological: no tremors, no numbness, no tingling, no dizziness Psychiatric: no depression, no anxiety   Objective:    BP (!) 151/81 (BP Location: Left Arm, Patient Position: Sitting)   Pulse 83   Ht 5\' 5"  (1.651 m)   Wt (!) 253 lb 6.4 oz (114.9 kg)   BMI 42.17 kg/m   Wt Readings from Last 3 Encounters:  01/01/20 (!) 253 lb 6.4 oz (114.9 kg)  12/02/19 246 lb 1.9 oz (111.6 kg)  08/26/19 247 lb 9.6 oz (112.3 kg)      Physical Exam- Limited  Constitutional:  Body mass index is 42.17 kg/m. , not in acute distress, normal state of mind Eyes:  EOMI, no exophthalmos Neck: Supple Thyroid: No gross goiter Respiratory: Adequate breathing efforts Musculoskeletal: no gross deformities, strength intact in all four extremities, no gross restriction of joint movements Skin:  no rashes, no hyperemia Neurological: no tremor with outstretched hands,     Lipid Panel     Component Value Date/Time   CHOL 137 12/25/2019 0709   TRIG 77 12/25/2019 0709    HDL 56 12/25/2019 0709   CHOLHDL 2.4 12/25/2019 0709   VLDL 10 03/07/2016 0705   LDLCALC 65 12/25/2019 0709    Recent Results (from the past 2160 hour(s))  COMPLETE METABOLIC PANEL WITH GFR     Status: Abnormal   Collection Time: 12/25/19  7:09 AM  Result Value Ref Range   Glucose, Bld 127 (H) 65 - 99 mg/dL    Comment: .            Fasting reference interval . For someone without known diabetes, a glucose value >125 mg/dL indicates that they may have diabetes and this should be confirmed with a follow-up test. .    BUN 13 7 - 25 mg/dL   Creat 0.76 0.50 - 1.05 mg/dL    Comment: For patients >91 years of age, the reference limit for Creatinine is approximately 13% higher for people identified as African-American. .    GFR, Est Non African American 86 > OR = 60 mL/min/1.40m2   GFR, Est African American 100 > OR = 60 mL/min/1.49m2   BUN/Creatinine Ratio NOT APPLICABLE 6 - 22 (calc)   Sodium 139 135 - 146 mmol/L   Potassium 4.2 3.5 - 5.3 mmol/L   Chloride 103 98 - 110 mmol/L   CO2 27 20 - 32 mmol/L   Calcium 9.6 8.6 - 10.4 mg/dL   Total Protein 6.9 6.1 - 8.1 g/dL   Albumin 4.4 3.6 - 5.1 g/dL   Globulin 2.5 1.9 - 3.7 g/dL (calc)   AG Ratio 1.8 1.0 - 2.5 (calc)   Total Bilirubin 0.5 0.2 - 1.2 mg/dL   Alkaline phosphatase (APISO) 72 37 - 153 U/L   AST 27 10 - 35 U/L   ALT 38 (H) 6 - 29 U/L  Microalbumin / creatinine urine ratio     Status: None   Collection Time: 12/25/19  7:09 AM  Result Value Ref Range   Creatinine, Urine 194 20 - 275 mg/dL   Microalb, Ur 1.2 mg/dL    Comment: Reference Range Not established    Microalb Creat Ratio 6 <30 mcg/mg creat    Comment: . The ADA defines abnormalities in albumin  excretion as follows: Marland Kitchen Category         Result (mcg/mg creatinine) . Normal                    <30 Microalbuminuria         30-299  Clinical albuminuria   > OR = 300 . The ADA recommends that at least two of three specimens collected within a 3-6 month period  be abnormal before considering a patient to be within a diagnostic category.   Lipid panel     Status: None   Collection Time: 12/25/19  7:09 AM  Result Value Ref Range   Cholesterol 137 <200 mg/dL   HDL 56 > OR = 50 mg/dL   Triglycerides 77 <150 mg/dL   LDL Cholesterol (Calc) 65 mg/dL (calc)    Comment: Reference range: <100 . Desirable range <100 mg/dL for primary prevention;   <70 mg/dL for patients with CHD or diabetic patients  with > or = 2 CHD risk factors. Marland Kitchen LDL-C is now calculated using the Martin-Hopkins  calculation, which is a validated novel method providing  better accuracy than the Friedewald equation in the  estimation of LDL-C.  Cresenciano Genre et al. Annamaria Helling. 2585;277(82): 2061-2068  (http://education.[facility].com/faq/FAQ164)    Total CHOL/HDL Ratio 2.4 <5.0 (calc)   Non-HDL Cholesterol (Calc) 81 <130 mg/dL (calc)    Comment: For patients with diabetes plus 1 major ASCVD risk  factor, treating to a non-HDL-C goal of <100 mg/dL  (LDL-C of <70 mg/dL) is considered a therapeutic  option.   TSH     Status: None   Collection Time: 12/25/19  7:09 AM  Result Value Ref Range   TSH 1.16 0.40 - 4.50 mIU/L  T4, free     Status: None   Collection Time: 12/25/19  7:09 AM  Result Value Ref Range   Free T4 1.2 0.8 - 1.8 ng/dL  VITAMIN D 25 Hydroxy (Vit-D Deficiency, Fractures)     Status: Abnormal   Collection Time: 12/25/19  7:09 AM  Result Value Ref Range   Vit D, 25-Hydroxy 28 (L) 30 - 100 ng/mL    Comment: Vitamin D Status         25-OH Vitamin D: . Deficiency:                    <20 ng/mL Insufficiency:             20 - 29 ng/mL Optimal:                 > or = 30 ng/mL . For 25-OH Vitamin D testing on patients on  D2-supplementation and patients for whom quantitation  of D2 and D3 fractions is required, the [facility]ureD(TM) 25-OH VIT D, (D2,D3), LC/MS/MS is recommended: order  code [phone] (patients >91yrs). See Note 1 . Note 1 . For additional  information, please refer to  http://education.[facility].com/faq/FAQ199  (This link is being provided for informational/ educational purposes only.)   HgB A1c     Status: Abnormal   Collection Time: 01/01/20 11:26 AM  Result Value Ref Range   Hemoglobin A1C 8.2 (A) 4.0 - 5.6 %   HbA1c POC (<> result, manual entry)     HbA1c, POC (prediabetic range)     HbA1c, POC (controlled diabetic range)       Assessment & Plan:   1. Uncontrolled type 2 diabetes mellitus with complication, with long-term current use of insulin ([place])  -Patient presents today with her  meter and logs showing near target fasting glycemic profile and still above target postprandial glycemic profile.  According to her logs she has been injecting her prandial insulin 4 times a day at breakfast, lunch, snack time, and supper despite instructions to only do it 3 times a day with meals at last visit.  Her A1c today is 8.2% overall improving slightly from her last visit at 8.6%.  -Her  diabetes is  complicated by history of noncompliance (she recently showed better engagement) and patient remains at  extremely high risk for more acute and chronic complications of diabetes which include CAD, CVA, CKD, retinopathy, and neuropathy. These are all discussed in detail with the patient.  - I have re-counseled the patient on diet management and weight loss  by adopting a carbohydrate restricted / protein rich  Diet.  - The patient admits there is a room for improvement in their diet and drink choices. -  Suggestion is made for the patient to avoid simple carbohydrates from their diet including Cakes, Sweet Desserts / Pastries, Ice Cream, Soda (diet and regular), Sweet Tea, Candies, Chips, Cookies, Sweet Pastries,  Store Bought Juices, Alcohol in Excess of  1-2 drinks a day, Artificial Sweeteners, Coffee Creamer, and "Sugar-free" Products. This will help patient to have stable blood glucose profile and potentially avoid unintended  weight gain.   - I encouraged the patient to switch to  unprocessed or minimally processed complex starch and increased protein intake (animal or plant source), fruits, and vegetables.   - Patient is advised to stick to a routine mealtimes to eat 3 meals  a day and avoid unnecessary snacks ( to snack only to correct hypoglycemia).  - I have approached patient with the following individualized plan to manage diabetes and patient agrees.  -She will continue to require intensive treatment with  basal/bolus insulin in order for her to achieve and maintain control of diabetes to target. -She is advised to continue Lantus at 80 units SQ daily at bedtime.  She is also advised to continue Metformin 500 mg ER twice daily with meals, and continue glipizide 5 mg XL daily with breakfast. -I have discussed and changed her prandial insulin to NovoLog 22 units if glucose between 90 and 150 and eating, and patient specific sliding scale if glucose greater than 150.  Her maximum prandial insulin dose should be 28 units.  I have also increased her dose of Ozempic to 1 mg SQ weekly.  We did not have any samples of this in the office, she was advised to finish her remaining samples, and sent new prescription to pharmacy.  2) Lipids/HPL:  Her recent lipid panel shows controlled LDL at 65.  She is advised to continue simvastatin 10 mg p.o. daily at bedtime.  3) Hypertension:  Her blood pressure is not controlled to target as based on readings here today.  She reports that she has a blood pressure monitor at home and her readings are below 140/90 at home.  She is advised to continue lisinopril 5 mg p.o. daily.  4) Hypothyroidism: \ -Her recent thyroid labs are consistent with appropriate replacement.  She is advised to continue levothyroxine 75 mcg p.o. daily before breakfast. -  - We discussed about the correct intake of her thyroid hormone, on empty stomach at fasting, with water, separated by at least 30 minutes  from breakfast and other medications,  and separated by more than 4 hours from calcium, iron, multivitamins, acid reflux medications (PPIs). -Patient is made aware  of the fact that thyroid hormone replacement is needed for life, dose to be adjusted by periodic monitoring of thyroid function tests.  - I advised patient to maintain close follow up with Perlie Mayo, NP for primary care needs.  -- Time spent on this patient care encounter:  35 min, of which > 50% was spent in  counseling and the rest reviewing her blood glucose logs , discussing her hypoglycemia and hyperglycemia episodes, reviewing her current and  previous labs / studies  ( including abstraction from other facilities) and medications  doses and developing a  long term treatment plan and documenting her care.   Please refer to Patient Instructions for Blood Glucose Monitoring and Insulin/Medications Dosing Guide"  in media tab for additional information. Please  also refer to " Patient Self Inventory" in the Media  tab for reviewed elements of pertinent patient history.  Gillian Scarce participated in the discussions, expressed understanding, and voiced agreement with the above plans.  All questions were answered to her satisfaction. she is encouraged to contact clinic should she have any questions or concerns prior to her return visit.  Follow up plan: -Return in about 3 months (around 04/02/2020) for Diabetes follow up with A1c in office, Bring glucometer and logs.  Rayetta Pigg, FNP-BC [facility] Phone: [phone] Fax: [phone]   -  This note was partially dictated with voice recognition software. Similar sounding words can be transcribed inadequately or may not  be corrected upon review.  01/01/2020, 12:41 PM

## 2020-01-06 ENCOUNTER — Ambulatory Visit (HOSPITAL_COMMUNITY)
Admission: RE | Admit: 2020-01-06 | Discharge: 2020-01-06 | Disposition: A | Discharge status: Home / Self Care | Payer: Medicaid Other | Source: Ambulatory Visit | Attending: Family Medicine | Admitting: Family Medicine

## 2020-01-06 ENCOUNTER — Other Ambulatory Visit: Payer: Self-pay

## 2020-01-06 DIAGNOSIS — Z1231 Encounter for screening mammogram for malignant neoplasm of breast: Secondary | ICD-10-CM | POA: Diagnosis not present

## 2020-01-07 ENCOUNTER — Ambulatory Visit (HOSPITAL_COMMUNITY): Payer: Medicaid Other

## 2020-01-08 ENCOUNTER — Other Ambulatory Visit: Payer: Self-pay | Admitting: Family Medicine

## 2020-01-19 ENCOUNTER — Other Ambulatory Visit: Payer: Self-pay | Admitting: "Endocrinology

## 2020-01-22 ENCOUNTER — Other Ambulatory Visit: Payer: Self-pay

## 2020-01-22 MED ORDER — ACCU-CHEK FASTCLIX LANCETS MISC: 0 refills | Status: DC

## 2020-01-22 MED ORDER — ACCU-CHEK GUIDE VI STRP: ORAL_STRIP | 0 refills | Status: DC

## 2020-01-25 ENCOUNTER — Telehealth: Payer: Self-pay

## 2020-01-25 NOTE — Telephone Encounter (Signed)
Pt is calling to see if she can get her Diabetic supplies to [facility]   Strips Pen tips Lancets  If you have any questions please call

## 2020-01-26 ENCOUNTER — Other Ambulatory Visit: Payer: Self-pay | Admitting: Family Medicine

## 2020-01-26 DIAGNOSIS — IMO0002 Reserved for concepts with insufficient information to code with codable children: Secondary | ICD-10-CM

## 2020-01-26 MED ORDER — LITETOUCH PEN NEEDLES 31G X 8 MM MISC: 3 refills | Status: DC

## 2020-01-26 MED ORDER — ACCU-CHEK GUIDE VI STRP: ORAL_STRIP | 3 refills | Status: DC

## 2020-01-26 MED ORDER — ACCU-CHEK FASTCLIX LANCETS MISC: 3 refills | Status: DC

## 2020-01-26 NOTE — Telephone Encounter (Signed)
I have reviewed this and ordered the needed items. Thank you for follow up.

## 2020-01-27 ENCOUNTER — Other Ambulatory Visit: Payer: Self-pay | Admitting: Family Medicine

## 2020-01-27 ENCOUNTER — Other Ambulatory Visit: Payer: Self-pay

## 2020-01-27 DIAGNOSIS — M159 Polyosteoarthritis, unspecified: Secondary | ICD-10-CM

## 2020-01-27 MED ORDER — DICLOFENAC SODIUM 1 % EX GEL: 2.0000 g | Freq: Two times a day (BID) | CUTANEOUS | 0 refills | Status: DC | PRN

## 2020-02-22 ENCOUNTER — Other Ambulatory Visit: Payer: Self-pay | Admitting: "Endocrinology

## 2020-02-22 DIAGNOSIS — IMO0002 Reserved for concepts with insufficient information to code with codable children: Secondary | ICD-10-CM

## 2020-02-24 ENCOUNTER — Other Ambulatory Visit: Payer: Self-pay

## 2020-02-24 ENCOUNTER — Ambulatory Visit
Admission: EM | Admit: 2020-02-24 | Discharge: 2020-02-24 | Disposition: A | Discharge status: Home / Self Care | Payer: Medicaid Other | Attending: Emergency Medicine | Admitting: Emergency Medicine

## 2020-02-24 DIAGNOSIS — Z1152 Encounter for screening for COVID-19: Secondary | ICD-10-CM

## 2020-02-24 DIAGNOSIS — Z20828 Contact with and (suspected) exposure to other viral communicable diseases: Secondary | ICD-10-CM | POA: Diagnosis not present

## 2020-02-24 NOTE — ED Triage Notes (Signed)
covid test no s/s

## 2020-02-25 DIAGNOSIS — L03031 Cellulitis of right toe: Secondary | ICD-10-CM | POA: Diagnosis not present

## 2020-02-25 DIAGNOSIS — L03032 Cellulitis of left toe: Secondary | ICD-10-CM | POA: Diagnosis not present

## 2020-02-25 DIAGNOSIS — B351 Tinea unguium: Secondary | ICD-10-CM | POA: Diagnosis not present

## 2020-02-26 LAB — NOVEL CORONAVIRUS, NAA: SARS-CoV-2, NAA: NOT DETECTED

## 2020-02-26 LAB — SARS-COV-2, NAA 2 DAY TAT

## 2020-03-03 ENCOUNTER — Ambulatory Visit: Payer: Medicaid Other | Admitting: Family Medicine

## 2020-03-09 ENCOUNTER — Other Ambulatory Visit: Payer: Self-pay | Admitting: Family Medicine

## 2020-03-19 ENCOUNTER — Other Ambulatory Visit: Payer: Self-pay | Admitting: "Endocrinology

## 2020-03-24 DIAGNOSIS — M79606 Pain in leg, unspecified: Secondary | ICD-10-CM | POA: Diagnosis not present

## 2020-03-24 DIAGNOSIS — M545 Low back pain, unspecified: Secondary | ICD-10-CM | POA: Diagnosis not present

## 2020-03-24 DIAGNOSIS — M542 Cervicalgia: Secondary | ICD-10-CM | POA: Diagnosis not present

## 2020-03-24 DIAGNOSIS — Z79891 Long term (current) use of opiate analgesic: Secondary | ICD-10-CM | POA: Diagnosis not present

## 2020-03-24 DIAGNOSIS — I10 Essential (primary) hypertension: Secondary | ICD-10-CM | POA: Diagnosis not present

## 2020-04-05 ENCOUNTER — Ambulatory Visit (INDEPENDENT_AMBULATORY_CARE_PROVIDER_SITE_OTHER): Payer: Medicaid Other | Admitting: Nurse Practitioner

## 2020-04-05 ENCOUNTER — Other Ambulatory Visit: Payer: Self-pay

## 2020-04-05 ENCOUNTER — Encounter: Payer: Self-pay | Admitting: Nurse Practitioner

## 2020-04-05 VITALS — BP 136/76 | HR 64 | Ht 65.0 in | Wt 250.0 lb

## 2020-04-05 DIAGNOSIS — I10 Essential (primary) hypertension: Secondary | ICD-10-CM | POA: Diagnosis not present

## 2020-04-05 DIAGNOSIS — E1165 Type 2 diabetes mellitus with hyperglycemia: Secondary | ICD-10-CM

## 2020-04-05 DIAGNOSIS — E039 Hypothyroidism, unspecified: Secondary | ICD-10-CM | POA: Diagnosis not present

## 2020-04-05 DIAGNOSIS — Z794 Long term (current) use of insulin: Secondary | ICD-10-CM | POA: Diagnosis not present

## 2020-04-05 DIAGNOSIS — E118 Type 2 diabetes mellitus with unspecified complications: Secondary | ICD-10-CM

## 2020-04-05 DIAGNOSIS — E782 Mixed hyperlipidemia: Secondary | ICD-10-CM

## 2020-04-05 DIAGNOSIS — IMO0002 Reserved for concepts with insufficient information to code with codable children: Secondary | ICD-10-CM

## 2020-04-05 LAB — POCT GLYCOSYLATED HEMOGLOBIN (HGB A1C): Hemoglobin A1C: 8.9 % — AB (ref 4.0–5.6)

## 2020-04-05 MED ORDER — OZEMPIC (1 MG/DOSE) 2 MG/1.5ML ~~LOC~~ SOPN: 1.0000 mg | PEN_INJECTOR | SUBCUTANEOUS | 3 refills | Status: DC

## 2020-04-05 MED ORDER — GLIPIZIDE ER 10 MG PO TB24: 10.0000 mg | ORAL_TABLET | Freq: Every day | ORAL | 3 refills | Status: DC

## 2020-04-05 NOTE — Patient Instructions (Signed)

## 2020-04-05 NOTE — Progress Notes (Signed)
04/05/2020   Endocrinology follow-up note   Subjective:    Patient ID: Sandy Valencia, female    DOB: 28-May-1961, PCP Perlie Mayo, NP   Past Medical History:  Diagnosis Date  . Allergic rhinitis due to pollen   . 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 Anaphylactic shock, unspecified, subsequent encounter   . Anemia, iron deficiency 02/14/2015  . Angio-edema   . Arthritis   . Asthma   . Asymptomatic varicose veins of right lower extremity   . Chronic obstructive pulmonary disease with (acute) exacerbation ([place])   . Chronic rhinitis 05/21/2018  . COVID-19   . Diabetes mellitus   . Essential (primary) hypertension   . Family history of colon cancer 09/03/2012  . Fatty liver   . Gastro-esophageal reflux disease without esophagitis   . GERD (gastroesophageal reflux disease)   . History of colonic polyps 08/26/2017  . Hypercholesteremia   . Hypertension   . Hypothyroidism   . Low back pain   . Mass of finger, right s/p surgical excision (mucoid cyst) 04/17/18 04/22/2018  . Mucous cyst of digit of right hand   . Obesity, unspecified   . Other adverse food reactions, not elsewhere classified, initial encounter   . Personal history of noncompliance with medical treatment, presenting hazards to health 05/18/2015  . Rash and other nonspecific skin eruption   . Sleep apnea    pt siad, "i had small amount but could not tolerate CPAP and they said it was ok since it was mild.  . Sleep apnea, unspecified   . Unspecified asthma, uncomplicated   . Zoster without complications    Past Surgical History:  Procedure Laterality Date  . ABDOMINAL HYSTERECTOMY    . CHOLECYSTECTOMY    . COLONOSCOPY N/A 09/22/2012   RMR: colonic polyps -removed as described above. tubular adenoma, next TCS 09/2017  . COLONOSCOPY WITH PROPOFOL N/A 09/23/2017   Procedure: COLONOSCOPY WITH PROPOFOL;  Surgeon: Daneil Dolin, MD;  [facility];  Service: Endoscopy;  Laterality:  N/A;  7:30am  . CYST EXCISION Left 07/19/2016   Procedure: CYST REMOVAL LEFT RING FINGER;  Surgeon: Carole Civil, MD;  Location: [facility];  Service: Orthopedics;  Laterality: Left;  . CYST REMOVAL LEG Right    foot  . ESOPHAGOGASTRODUODENOSCOPY N/A 10/21/2014   RMR: Small hiatal hernia; otherwise normal EGD status post passage of a Maloney dilator.   Marland Kitchen EXCISION MASS UPPER EXTREMETIES Right 04/17/2018   Procedure: EXCISION MASS UPPER EXTREMETIES right ring finger;  Surgeon: Carole Civil, MD;  Location: [facility];  Service: Orthopedics;  Laterality: Right;  . MALONEY DILATION N/A 10/21/2014   Procedure: [name] DILATION;  Surgeon: Daneil Dolin, MD;  Location: [facility];  Service: Endoscopy;  Laterality: N/A;  . MIDDLE EAR SURGERY Right    patched hole in ear drum  . POLYPECTOMY  09/23/2017   Procedure: POLYPECTOMY;  Surgeon: Daneil Dolin, MD;  Location: [facility];  Service: Endoscopy;;  polyp hepatic flexure polyp cs, splenic flexure polyp cs, rectal polyp cs   Social History   Socioeconomic History  . Marital status: Divorced    Spouse name: Not on file  . Number of children: 2  . Years of education: Not on file  . Highest education level: Not on file  Occupational History  . Not on file  Tobacco Use  . Smoking status: Never Smoker  . Smokeless tobacco: Never Used  Vaping Use  .  Vaping Use: Never used  Substance and Sexual Activity  . Alcohol use: No    Alcohol/week: 0.0 standard drinks  . Drug use: No  . Sexual activity: Yes    Birth control/protection: Surgical  Other Topics Concern  . Not on file  Social History Narrative   Lives with grandson (since born)      Enjoys: vacation, travel, concerts      Diet: eats all food groups    Caffeine: diet Pepsi daily    Water:  8 cups daily       Wears seat belt    Does use phone   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    Social Determinants of Health   Financial Resource Strain: Low Risk   .  Difficulty of Paying Living Expenses: Not hard at all  Food Insecurity: No Food Insecurity  . Worried About Charity fundraiser in the Last Year: Never true  . Ran Out of Food in the Last Year: Never true  Transportation Needs: No Transportation Needs  . Lack of Transportation (Medical): No  . Lack of Transportation (Non-Medical): No  Physical Activity: Insufficiently Active  . Days of Exercise per Week: 2 days  . Minutes of Exercise per Session: 20 min  Stress:   . Feeling of Stress : Not on file  Social Connections: Moderately Isolated  . Frequency of Communication with Friends and Family: More than three times a week  . Frequency of Social Gatherings with Friends and Family: More than three times a week  . Attends Religious Services: More than 4 times per year  . Active Member of Clubs or Organizations: No  . Attends Archivist Meetings: Never  . Marital Status: Divorced   Outpatient Encounter Medications as of 04/05/2020  Medication Sig  . Accu-Chek FastClix Lancets MISC USE TO TEST 4 TIMES DAILY.  Marland Kitchen albuterol (VENTOLIN HFA) 108 (90 Base) MCG/ACT inhaler Inhale 4 puffs into the lungs every 6 (six) hours as needed for wheezing or shortness of breath.  Marland Kitchen aspirin EC 81 MG tablet Take 81 mg by mouth daily.  . Cholecalciferol (VITAMIN D-3) 1000 UNITS CAPS Take 1,000 Units by mouth daily.   . diclofenac Sodium (VOLTAREN) 1 % GEL Apply 2 g topically 2 (two) times daily as needed.  Marland Kitchen EPINEPHrine 0.3 mg/0.3 mL IJ SOAJ injection Inject 0.3 mg into the muscle as needed for anaphylaxis.  Marland Kitchen glipiZIDE (GLUCOTROL XL) 10 MG 24 hr tablet Take 1 tablet (10 mg total) by mouth daily with breakfast.  . glucose blood (ACCU-CHEK GUIDE) test strip USE TO TEST 4 TIMES DAILY.  Marland Kitchen HYDROcodone-acetaminophen (NORCO) 10-325 MG tablet Take 1 tablet by mouth every 6 (six) hours as needed.  . insulin aspart (NOVOLOG FLEXPEN) 100 UNIT/ML FlexPen Inject 22-28 Units into the skin 3 (three) times daily with  meals.  Marland Kitchen LANTUS SOLOSTAR 100 UNIT/ML Solostar Pen INJECT 80 UNITS SUBCUTANEOUSLY INTO THE SKIN DAILY.  Marland Kitchen levothyroxine (SYNTHROID) 75 MCG tablet TAKE ONE (1) TABLET BY MOUTH EVERY DAY BEFORE BREAKFAST  . lisinopril (ZESTRIL) 5 MG tablet Take 5 mg by mouth daily.  Marland Kitchen LITETOUCH PEN NEEDLES 31G X 8 MM MISC USE AS DIRECTED FOURNTIMES DAILY.  . metFORMIN (GLUCOPHAGE-XR) 500 MG 24 hr tablet TAKE ONE TABLET BY MOUTH TWICE A DAY AFTER A MEAL  . pantoprazole (PROTONIX) 40 MG tablet TAKE ONE TABLET (40MG  TOTAL) BY MOUTH DAILY  . pregabalin (LYRICA) 150 MG capsule Take 150 mg by mouth 2 (  two) times daily.  . Semaglutide, 1 MG/DOSE, (OZEMPIC, 1 MG/DOSE,) 2 MG/1.5ML SOPN Inject 1 mg into the skin once a week.  . simvastatin (ZOCOR) 10 MG tablet TAKE ONE (1) TABLET BY MOUTH EVERY DAY  . vitamin B-12 (CYANOCOBALAMIN) 500 MCG tablet Take 500 mcg by mouth daily.  . [DISCONTINUED] glipiZIDE (GLUCOTROL XL) 5 MG 24 hr tablet TAKE ONE TABLET (5MG  TOTAL) BY MOUTH DAILY WITH BREAKFAST  . [DISCONTINUED] Semaglutide, 1 MG/DOSE, (OZEMPIC, 1 MG/DOSE,) 2 MG/1.5ML SOPN Inject 0.75 mLs (1 mg total) into the skin once a week.   No facility-administered encounter medications on file as of 04/05/2020.   ALLERGIES: Allergies  Allergen Reactions  . Kiwi Extract Anaphylaxis, Swelling and Palpitations  . Other Anaphylaxis    Walnuts (avoids all tree nuts)   VACCINATION STATUS: Immunization History  Administered Date(s) Administered  . Influenza,inj,Quad PF,6+ Mos 03/30/2019  . Influenza-Unspecified 04/17/2011, 03/24/2012, 05/12/2013, 02/23/2015, 04/04/2017, 04/08/2018  . Moderna SARS-COVID-2 Vaccination 10/22/2019, 11/19/2019    Diabetes She presents for her follow-up diabetic visit. She has type 2 diabetes mellitus. Her disease course has been worsening (She was diagnosed at approximate age of 50 years.). There are no hypoglycemic associated symptoms. Pertinent negatives for hypoglycemia include no confusion, pallor or  seizures. Associated symptoms include polydipsia and polyuria. Pertinent negatives for diabetes include no fatigue, no polyphagia and no weight loss. There are no hypoglycemic complications. Symptoms are worsening. There are no diabetic complications. Risk factors for coronary artery disease include dyslipidemia, diabetes mellitus, hypertension, sedentary lifestyle and obesity. Current diabetic treatment includes insulin injections and oral agent (dual therapy). She is compliant with treatment some of the time (reports she did not increase her Ozempic as instructed at last visit). Her weight is fluctuating minimally. She is following a generally unhealthy diet. When asked about meal planning, she reported none. She has not had a previous visit with a dietitian. She never participates in exercise. Her home blood glucose trend is fluctuating minimally. Her breakfast blood glucose range is generally 180-200 mg/dl. Her lunch blood glucose range is generally >200 mg/dl. Her dinner blood glucose range is generally 140-180 mg/dl. Her bedtime blood glucose range is generally >200 mg/dl. Her overall blood glucose range is 180-200 mg/dl. (She presents today with her meter and logs showing persistently above target fasting and postprandial glycemic profile overall.  Her POCT A1C today is 8.9%, worsening from last visit of 8.2%.  She never did increase her dose of Ozempic as recommended on last visit.  She denies any episodes of hypoglycemia.) An ACE inhibitor/angiotensin II receptor blocker is being taken. She sees a podiatrist.Eye exam is current.  Hyperlipidemia This is a chronic problem. The current episode started more than 1 year ago. The problem is controlled. Recent lipid tests were reviewed and are normal. Exacerbating diseases include chronic renal disease, diabetes, hypothyroidism and obesity. There are no known factors aggravating her hyperlipidemia. Pertinent negatives include no myalgias. Current  antihyperlipidemic treatment includes statins. The current treatment provides significant improvement of lipids. Compliance problems include medication cost.  Risk factors for coronary artery disease include diabetes mellitus, dyslipidemia, hypertension, obesity, a sedentary lifestyle and post-menopausal.     Review of systems  Constitutional: + Minimally fluctuating body weight,  current Body mass index is 41.6 kg/m. , no fatigue, no subjective hyperthermia, no subjective hypothermia Eyes: no blurry vision, no xerophthalmia ENT: no sore throat, no nodules palpated in throat, no dysphagia/odynophagia, no hoarseness Cardiovascular: no chest pain, no shortness of breath, no palpitations, no leg  swelling Respiratory: no cough, no shortness of breath Gastrointestinal: no nausea/vomiting/diarrhea Musculoskeletal: no muscle/joint aches Skin: no rashes, no hyperemia Neurological: no tremors, no numbness, no tingling, no dizziness Psychiatric: no depression, no anxiety   Objective:    BP 136/76 (BP Location: Left Arm, Patient Position: Sitting)   Pulse 64   Ht 5\' 5"  (1.651 m)   Wt 250 lb (113.4 kg)   BMI 41.60 kg/m   Wt Readings from Last 3 Encounters:  04/05/20 250 lb (113.4 kg)  01/01/20 (!) 253 lb 6.4 oz (114.9 kg)  12/02/19 246 lb 1.9 oz (111.6 kg)    BP Readings from Last 3 Encounters:  04/05/20 136/76  02/24/20 (!) 176/93  01/01/20 (!) 151/81     Physical Exam- Limited  Constitutional:  Body mass index is 41.6 kg/m. , not in acute distress, normal state of mind Eyes:  EOMI, no exophthalmos Neck: Supple Thyroid: No gross goiter Cardiovascular: RRR, no murmers, rubs, or gallops, no edema Respiratory: Adequate breathing efforts, no crackles, rales, rhonchi, or wheezing Musculoskeletal: no gross deformities, strength intact in all four extremities, no gross restriction of joint movements Skin:  no rashes, no hyperemia Neurological: no tremor with outstretched  hands    Lipid Panel     Component Value Date/Time   CHOL 137 12/25/2019 0709   TRIG 77 12/25/2019 0709   HDL 56 12/25/2019 0709   CHOLHDL 2.4 12/25/2019 0709   VLDL 10 03/07/2016 0705   LDLCALC 65 12/25/2019 0709    Recent Results (from the past 2160 hour(s))  Novel Coronavirus, NAA ([facility])     Status: None   Collection Time: 02/24/20  5:52 PM   Specimen: Nasopharyngeal(NP) swabs in vial transport medium   Nasopharynge  Patient  Result Value Ref Range   SARS-CoV-2, NAA Not Detected Not Detected    Comment: This nucleic acid amplification test was developed and its performance characteristics determined by [employer]. Nucleic acid amplification tests include RT-PCR and TMA. This test has not been FDA cleared or approved. This test has been authorized by FDA under an Emergency Use Authorization (EUA). This test is only authorized for the duration of time the declaration that circumstances exist justifying the authorization of the emergency use of in vitro diagnostic tests for detection of SARS-CoV-2 virus and/or diagnosis of COVID-19 infection under section 564(b)(1) of the Act, 21 U.S.C. 315VVO-1(Y) (1), unless the authorization is terminated or revoked sooner. When diagnostic testing is negative, the possibility of a false negative result should be considered in the context of a patient's recent exposures and the presence of clinical signs and symptoms consistent with COVID-19. An individual without symptoms of COVID-19 and who is not shedding SARS-CoV-2 virus wo uld expect to have a negative (not detected) result in this assay.   SARS-COV-2, NAA 2 DAY TAT     Status: None   Collection Time: 02/24/20  5:52 PM   Nasopharynge  Patient  Result Value Ref Range   SARS-CoV-2, NAA 2 DAY TAT Performed   HgB A1c     Status: Abnormal   Collection Time: 04/05/20 10:31 AM  Result Value Ref Range   Hemoglobin A1C 8.9 (A) 4.0 - 5.6 %   HbA1c POC (<> result, manual  entry)     HbA1c, POC (prediabetic range)     HbA1c, POC (controlled diabetic range)       Assessment & Plan:   1. Uncontrolled type 2 diabetes mellitus with complication, with long-term current use of insulin ([place])  She  presents today with her meter and logs showing persistently above target fasting and postprandial glycemic profile overall.  Her POCT A1C today is 8.9%, worsening from last visit of 8.2%.  She never did increase her dose of Ozempic as recommended on last visit.  She denies any episodes of hypoglycemia.  -Her  diabetes is  complicated by history of noncompliance (she recently showed better engagement) and patient remains at  extremely high risk for more acute and chronic complications of diabetes which include CAD, CVA, CKD, retinopathy, and neuropathy. These are all discussed in detail with the patient.  - Nutritional counseling repeated at each appointment due to patients tendency to fall back in to old habits.  - The patient admits there is a room for improvement in their diet and drink choices. -  Suggestion is made for the patient to avoid simple carbohydrates from their diet including Cakes, Sweet Desserts / Pastries, Ice Cream, Soda (diet and regular), Sweet Tea, Candies, Chips, Cookies, Sweet Pastries,  Store Bought Juices, Alcohol in Excess of  1-2 drinks a day, Artificial Sweeteners, Coffee Creamer, and "Sugar-free" Products. This will help patient to have stable blood glucose profile and potentially avoid unintended weight gain.   - I encouraged the patient to switch to  unprocessed or minimally processed complex starch and increased protein intake (animal or plant source), fruits, and vegetables.   - Patient is advised to stick to a routine mealtimes to eat 3 meals  a day and avoid unnecessary snacks ( to snack only to correct hypoglycemia).  - I have approached patient with the following individualized plan to manage diabetes and patient agrees.  -She will  continue to require intensive treatment with  basal/bolus insulin in order for her to achieve and maintain control of diabetes to target.  -Given her recent loss of control, she is advised to increase her Glipizide to 10 mg XL daily with breakfast (can take 2 of her 5mg  tablets each morning until supply depleted) and increase her Ozempic to 1 mg SQ weekly (samples provided from office of the 0.5 mg pens- she is instructed to inject twice to make the 1 mg dose each week until supply is finished).  She is advised to continue Lantus 80 units SQ daily at bedtime and continue Novolog 22-28 units TID with meals if glucose is above 90 and she is eating.  Specific instructions on how to titrate insulin dose based on glucose readings given to patient in writing.  -She is encouraged to continue monitoring blood glucose 4 times daily, before meals and before bed, and to call the clinic if she has readings less than 70 or greater than 300 for 3 tests in a row.  2) Lipids/HPL:  Her most recent lipid panel from 12/25/19 shows controlled LDL of 65.  She is advised to continue Simvastatin 20 mg po daily at bedtime.  Side effects and precautions discussed with her.  3) Hypertension:  Her blood pressure is controlled to target.  She is advised to continue Lisinopril 5 mg po daily.  4) Hypothyroidism:  -There are no recent TFTs to review.  She is advised to continue Levothyroxine 75 mcg po daily before breakfast.  Will recheck TFTs prior to next visit and adjust dose if needed. -  - We discussed about the correct intake of her thyroid hormone, on empty stomach at fasting, with water, separated by at least 30 minutes from breakfast and other medications,  and separated by more than 4 hours from calcium,  iron, multivitamins, acid reflux medications (PPIs). -Patient is made aware of the fact that thyroid hormone replacement is needed for life, dose to be adjusted by periodic monitoring of thyroid function tests.    - I  advised patient to maintain close follow up with Perlie Mayo, NP for primary care needs.  - Time spent on this patient care encounter:  35 min, of which > 50% was spent in  counseling and the rest reviewing her blood glucose logs , discussing her hypoglycemia and hyperglycemia episodes, reviewing her current and  previous labs / studies  ( including abstraction from other facilities) and medications  doses and developing a  long term treatment plan and documenting her care.   Please refer to Patient Instructions for Blood Glucose Monitoring and Insulin/Medications Dosing Guide"  in media tab for additional information. Please  also refer to " Patient Self Inventory" in the Media  tab for reviewed elements of pertinent patient history.  Gillian Scarce participated in the discussions, expressed understanding, and voiced agreement with the above plans.  All questions were answered to her satisfaction. she is encouraged to contact clinic should she have any questions or concerns prior to her return visit.  Follow up plan: -Return for Diabetes follow up with A1c in office, Previsit labs, Thyroid follow up.  Rayetta Pigg, [facility] [address] [place], [place] [address] Phone: [phone] Fax: [phone]   04/05/2020, 12:32 PM

## 2020-04-06 ENCOUNTER — Telehealth: Payer: Self-pay

## 2020-04-06 ENCOUNTER — Encounter: Payer: Self-pay | Admitting: Family

## 2020-04-06 ENCOUNTER — Ambulatory Visit (INDEPENDENT_AMBULATORY_CARE_PROVIDER_SITE_OTHER): Payer: Medicaid Other | Admitting: Family

## 2020-04-06 ENCOUNTER — Other Ambulatory Visit: Payer: Self-pay | Admitting: Family Medicine

## 2020-04-06 ENCOUNTER — Other Ambulatory Visit: Payer: Self-pay

## 2020-04-06 VITALS — BP 140/92 | HR 66 | Temp 98.2°F | Resp 17

## 2020-04-06 DIAGNOSIS — K219 Gastro-esophageal reflux disease without esophagitis: Secondary | ICD-10-CM | POA: Diagnosis not present

## 2020-04-06 DIAGNOSIS — T7800XD Anaphylactic reaction due to unspecified food, subsequent encounter: Secondary | ICD-10-CM

## 2020-04-06 DIAGNOSIS — J4520 Mild intermittent asthma, uncomplicated: Secondary | ICD-10-CM

## 2020-04-06 MED ORDER — EPINEPHRINE 0.3 MG/0.3ML IJ SOAJ: 0.3000 mg | INTRAMUSCULAR | 1 refills | Status: DC | PRN

## 2020-04-06 NOTE — Progress Notes (Signed)
[address], [address] [place] [address] Dept: [phone]  FOLLOW UP NOTE  Patient ID: Sandy Valencia, female    DOB: 19-Jul-1960  Age: 59 y.o. MRN: [id number] Date of Office Visit: 04/06/2020  Assessment  Chief Complaint: Asthma  HPI Sandy Valencia is a 59 year old female who presents today for follow-up of mild intermittent asthma, gastroesophageal reflux disease, and anaphylaxis due to food.  She was last seen on November 18, 2019 by Dr. Ernst Bowler.  Mild intermittent asthma is reported as controlled with albuterol as needed.  She denies any coughing, wheezing, tightness in chest, shortness of breath, and nocturnal awakenings.  She has not required any systemic steroids or made any trips to the emergency room or urgent care due to breathing problems.  Since her last office visit she has not had to use her albuterol inhaler.  Reflux is reported as moderately controlled with pantoprazole 40 mg once a day.  She reports that she will notice her reflux if she eats spicy foods.  She continues to avoid kiwi and walnuts without any accidental ingestion or use of her EpiPen.  She is requesting a refill of her EpiPen today.  She is still able to eat peanut butter without any problems.  Current medications are as listed in the chart.   Drug Allergies:  Allergies  Allergen Reactions  . Kiwi Extract Anaphylaxis, Swelling and Palpitations  . Other Anaphylaxis    Walnuts (avoids all tree nuts)    Review of Systems: Review of Systems  Constitutional: Negative for chills and fever.  HENT:       Denies rhinorrhea, post nasal drip, and nasal congesiton  Eyes:       Denies itchy watery eyes  Respiratory: Negative for cough, shortness of breath and wheezing.   Cardiovascular: Negative for chest pain and palpitations.  Gastrointestinal: Negative for abdominal pain and heartburn.  Genitourinary: Negative for dysuria.  Skin: Negative for itching and rash.  Neurological: Negative for  headaches.    Physical Exam: BP (!) 140/92 (BP Location: Right Arm, Patient Position: Sitting, Cuff Size: Normal)   Pulse 66   Temp 98.2 F (36.8 C)   Resp 17   SpO2 97%    Physical Exam Constitutional:      Appearance: Normal appearance.  HENT:     Head: Normocephalic and atraumatic.     Comments: Pharynx normal. Eyes normal. Ears normal. Nose normal    Right Ear: Tympanic membrane, ear canal and external ear normal.     Left Ear: Tympanic membrane, ear canal and external ear normal.     Nose: Nose normal.     Mouth/Throat:     Mouth: Mucous membranes are moist.     Pharynx: Oropharynx is clear.  Eyes:     Conjunctiva/sclera: Conjunctivae normal.  Cardiovascular:     Rate and Rhythm: Regular rhythm.     Heart sounds: Normal heart sounds.  Pulmonary:     Effort: Pulmonary effort is normal.     Breath sounds: Normal breath sounds.     Comments: Lungs clear to auscultation Musculoskeletal:     Cervical back: Neck supple.  Skin:    General: Skin is warm.  Neurological:     Mental Status: She is alert and oriented to person, place, and time.  Psychiatric:        Mood and Affect: Mood normal.        Behavior: Behavior normal.        Thought Content: Thought content  normal.        Judgment: Judgment normal.     Diagnostics:  None  Assessment and Plan: 1. Mild intermittent asthma without complication   2. Anaphylactic shock due to food, subsequent encounter   3. Gastroesophageal reflux disease, unspecified whether esophagitis present     Meds ordered this encounter  Medications  . EPINEPHrine 0.3 mg/0.3 mL IJ SOAJ injection    Sig: Inject 0.3 mg into the muscle as needed for anaphylaxis.    Dispense:  2 each    Refill:  1    Patient Instructions  1. Mild intermittent asthma, uncomplicated - Continue with albuterol 2 puffs every 4-6 hours as needed.     2. Gastroesophageal reflux disease - Continue with pantoprazole 40mg  daily.  3. Anaphylaxis to food -  Continue to avoid kiwi and walnuts. - EpiPen refilled today.    4. Schedule a follow up appointment in 6 months     Return in about 6 months (around 10/04/2020), or if symptoms worsen or fail to improve.    Thank you for the opportunity to care for this patient.  Please do not hesitate to contact me with questions.  Althea Charon, FNP [facility] [place] [facility]

## 2020-04-06 NOTE — Telephone Encounter (Signed)
Prior authorization was submitted to [employer] Med[employer] on 04/05/20 for Ozempic 1mg /dose, Received insurance denial today from [facility], will not cover Ozempic 1mg . Provider made aware, will discuss alternatives once patient has exhausted current supply of medication.

## 2020-04-06 NOTE — Patient Instructions (Signed)
1. Mild intermittent asthma, uncomplicated - Continue with albuterol 2 puffs every 4-6 hours as needed.     2. Gastroesophageal reflux disease - Continue with pantoprazole 40mg  daily.  3. 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 - EpiPen refilled today.    4. Schedule a follow up appointment in 6 months

## 2020-04-07 ENCOUNTER — Telehealth (INDEPENDENT_AMBULATORY_CARE_PROVIDER_SITE_OTHER): Payer: Medicaid Other | Admitting: Family Medicine

## 2020-04-07 ENCOUNTER — Encounter: Payer: Self-pay | Admitting: Family Medicine

## 2020-04-07 VITALS — BP 134/78 | Temp 97.6°F | Ht 65.0 in | Wt 249.0 lb

## 2020-04-07 DIAGNOSIS — E118 Type 2 diabetes mellitus with unspecified complications: Secondary | ICD-10-CM

## 2020-04-07 DIAGNOSIS — Z794 Long term (current) use of insulin: Secondary | ICD-10-CM | POA: Diagnosis not present

## 2020-04-07 DIAGNOSIS — Z6841 Body Mass Index (BMI) 40.0 and over, adult: Secondary | ICD-10-CM | POA: Diagnosis not present

## 2020-04-07 DIAGNOSIS — E1165 Type 2 diabetes mellitus with hyperglycemia: Secondary | ICD-10-CM | POA: Diagnosis not present

## 2020-04-07 DIAGNOSIS — IMO0002 Reserved for concepts with insufficient information to code with codable children: Secondary | ICD-10-CM

## 2020-04-07 DIAGNOSIS — I10 Essential (primary) hypertension: Secondary | ICD-10-CM | POA: Diagnosis not present

## 2020-04-07 MED ORDER — KETOCONAZOLE 2 % EX CREA: 1.0000 "application " | TOPICAL_CREAM | Freq: Every day | CUTANEOUS | 0 refills | Status: DC | PRN

## 2020-04-07 NOTE — Patient Instructions (Signed)
°  HAPPY FALL!  I appreciate the opportunity to provide you with care for your health and wellness. Today we discussed: overall health   Follow up: July 2020 for CPE-fasting for labs same day  No labs or referrals today  Have a [place] Season :)  Please continue to practice social distancing to keep you, your family, and our community safe.  If you must go out, please wear a mask and practice good handwashing.  It was a pleasure to see you and I look forward to continuing to work together on your health and well-being. Please do not hesitate to call the office if you need care or have questions about your care.  Have a wonderful day and week. With Gratitude, Cherly Beach, DNP, AGNP-BC

## 2020-04-07 NOTE — Assessment & Plan Note (Signed)
Followed by endocrinology.  Advised to continue current treatment plans as per them.  Is on statin and ACE.

## 2020-04-07 NOTE — Assessment & Plan Note (Signed)
Stable, obesity is linked to type 2 diabetes, hyperlipidemia, hyper tension  Sandy Valencia is re-educated about the importance of exercise daily to help with weight management. A minumum of 30 minutes daily is recommended. Additionally, importance of healthy food choices  with portion control discussed.   Wt Readings from Last 3 Encounters:  04/07/20 249 lb (112.9 kg)  04/05/20 250 lb (113.4 kg)  01/01/20 (!) 253 lb 6.4 oz (114.9 kg)

## 2020-04-07 NOTE — Assessment & Plan Note (Signed)
Sandy Valencia is encouraged to maintain a well balanced diet that is low in salt. Controlled, continue current medication regimen.   Additionally, she is also reminded that exercise is beneficial for heart health and control of  Blood pressure. 30-60 minutes daily is recommended-walking was suggested.

## 2020-04-07 NOTE — Progress Notes (Signed)
Virtual Visit via Telephone Note   This visit type was conducted due to national recommendations for restrictions regarding the COVID-19 Pandemic (e.g. social distancing) in an effort to limit this patient's exposure and mitigate transmission in our community.  Due to her co-morbid illnesses, this patient is at least at moderate risk for complications without adequate follow up.  This format is felt to be most appropriate for this patient at this time.  The patient did not have access to video technology/had technical difficulties with video requiring transitioning to audio format only (telephone).  All issues noted in this document were discussed and addressed.  No physical exam could be performed with this format.    Evaluation Performed:  Follow-up visit  Date:  04/07/2020   ID:  Sandy, Valencia 11-19-1960, MRN [id number]  Patient Location: Home Provider Location: Other:  offsite  Location of Patient: Home Location of Provider: Telehealth Consent was obtain for visit to be over via telehealth. I verified that I am speaking with the correct person using two identifiers.  PCP:  Sandy Mayo, NP   Chief Complaint:  Chronic condition   History of Present Illness:    Sandy Valencia is a 59 y.o. female with history as detailed below.  Overall she only reports that she needs a refill of her yeast cream that she uses on her breast.  Denies having any other issues or concerns today.  Reports that she has had her dental cleanings, eye check, follow-up with Endo and asthma providers.  She does not need any other refills on any of her medications.  She denies having any changes in her sleep habits.  She denies having trouble chewing or swallowing.  She denies having any changes in bowel or bladder habits.  Denies having any recent falls or injuries.  Denies having any hearing or vision changes.  She denies having any chest pain, palpitations, cough or shortness of breath.  The patient does  not have symptoms concerning for COVID-19 infection (fever, chills, cough, or new shortness of breath).   Past Medical, Surgical, Social History, Allergies, and Medications have been Reviewed.  Past Medical History:  Diagnosis Date  . Allergic rhinitis due to pollen   . 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 Anaphylactic shock, unspecified, subsequent encounter   . Anemia, iron deficiency 02/14/2015  . Angio-edema   . Arthritis   . Asthma   . Asymptomatic varicose veins of right lower extremity   . Chronic obstructive pulmonary disease with (acute) exacerbation ([place])   . Chronic rhinitis 05/21/2018  . COVID-19   . Diabetes mellitus   . Essential (primary) hypertension   . Family history of colon cancer 09/03/2012  . Fatty liver   . Gastro-esophageal reflux disease without esophagitis   . GERD (gastroesophageal reflux disease)   . History of colonic polyps 08/26/2017  . Hypercholesteremia   . Hypertension   . Hypothyroidism   . Low back pain   . Mass of finger, right s/p surgical excision (mucoid cyst) 04/17/18 04/22/2018  . Mucous cyst of digit of right hand   . Obesity, unspecified   . Other adverse food reactions, not elsewhere classified, initial encounter   . Personal history of noncompliance with medical treatment, presenting hazards to health 05/18/2015  . Rash and other nonspecific skin eruption   . Sleep apnea    pt siad, "i had small amount but could not tolerate CPAP and they said it was  ok since it was mild.  . Sleep apnea, unspecified   . Unspecified asthma, uncomplicated   . Zoster without complications    Past Surgical History:  Procedure Laterality Date  . ABDOMINAL HYSTERECTOMY    . CHOLECYSTECTOMY    . COLONOSCOPY N/A 09/22/2012   RMR: colonic polyps -removed as described above. tubular adenoma, next TCS 09/2017  . COLONOSCOPY WITH PROPOFOL N/A 09/23/2017   Procedure: COLONOSCOPY WITH PROPOFOL;  Surgeon: Daneil Dolin, MD;  Location: [facility];  Service: Endoscopy;  Laterality: N/A;  7:30am  . CYST EXCISION Left 07/19/2016   Procedure: CYST REMOVAL LEFT RING FINGER;  Surgeon: Carole Civil, MD;  Location: [facility];  Service: Orthopedics;  Laterality: Left;  . CYST REMOVAL LEG Right    foot  . ESOPHAGOGASTRODUODENOSCOPY N/A 10/21/2014   RMR: Small hiatal hernia; otherwise normal EGD status post passage of a Maloney dilator.   Marland Kitchen EXCISION MASS UPPER EXTREMETIES Right 04/17/2018   Procedure: EXCISION MASS UPPER EXTREMETIES right ring finger;  Surgeon: Carole Civil, MD;  Location: [facility];  Service: Orthopedics;  Laterality: Right;  . MALONEY DILATION N/A 10/21/2014   Procedure: [name] DILATION;  Surgeon: Daneil Dolin, MD;  Location: [facility];  Service: Endoscopy;  Laterality: N/A;  . MIDDLE EAR SURGERY Right    patched hole in ear drum  . POLYPECTOMY  09/23/2017   Procedure: POLYPECTOMY;  Surgeon: Daneil Dolin, MD;  Location: [facility];  Service: Endoscopy;;  polyp hepatic flexure polyp cs, splenic flexure polyp cs, rectal polyp cs     Current Meds  Medication Sig  . Accu-Chek FastClix Lancets MISC USE TO TEST 4 TIMES DAILY.  Marland Kitchen albuterol (VENTOLIN HFA) 108 (90 Base) MCG/ACT inhaler Inhale 4 puffs into the lungs every 6 (six) hours as needed for wheezing or shortness of breath.  Marland Kitchen aspirin EC 81 MG tablet Take 81 mg by mouth daily.  . Cholecalciferol (VITAMIN D-3) 1000 UNITS CAPS Take 1,000 Units by mouth daily.   . diclofenac Sodium (VOLTAREN) 1 % GEL Apply 2 g topically 2 (two) times daily as needed.  . Diclofenac Sodium 3 % GEL APPLY 2 GRAMS TO AFFECTED AREAS 2 TIMES A DAY AS NEEDED  . EPINEPHrine 0.3 mg/0.3 mL IJ SOAJ injection Inject 0.3 mg into the muscle as needed for anaphylaxis.  Marland Kitchen glipiZIDE (GLUCOTROL XL) 10 MG 24 hr tablet Take 1 tablet (10 mg total) by mouth daily with breakfast.  . glucose blood (ACCU-CHEK GUIDE) test strip USE TO TEST 4 TIMES DAILY.  Marland Kitchen HYDROcodone-acetaminophen (NORCO)  10-325 MG tablet Take 1 tablet by mouth every 6 (six) hours as needed.  . insulin aspart (NOVOLOG FLEXPEN) 100 UNIT/ML FlexPen Inject 22-28 Units into the skin 3 (three) times daily with meals.  Marland Kitchen LANTUS SOLOSTAR 100 UNIT/ML Solostar Pen INJECT 80 UNITS SUBCUTANEOUSLY INTO THE SKIN DAILY.  Marland Kitchen levothyroxine (SYNTHROID) 75 MCG tablet TAKE ONE (1) TABLET BY MOUTH EVERY DAY BEFORE BREAKFAST  . lisinopril (ZESTRIL) 5 MG tablet Take 5 mg by mouth daily.  Marland Kitchen LITETOUCH PEN NEEDLES 31G X 8 MM MISC USE AS DIRECTED FOURNTIMES DAILY.  . metFORMIN (GLUCOPHAGE-XR) 500 MG 24 hr tablet TAKE ONE TABLET BY MOUTH TWICE A DAY AFTER A MEAL  . pantoprazole (PROTONIX) 40 MG tablet TAKE ONE TABLET (40MG  TOTAL) BY MOUTH DAILY  . pregabalin (LYRICA) 150 MG capsule Take 150 mg by mouth 2 (two) times daily.  . Semaglutide, 1 MG/DOSE, (OZEMPIC, 1 MG/DOSE,) 2 MG/1.5ML SOPN  Inject 1 mg into the skin once a week.  . simvastatin (ZOCOR) 10 MG tablet TAKE ONE (1) TABLET BY MOUTH EVERY DAY  . vitamin B-12 (CYANOCOBALAMIN) 500 MCG tablet Take 500 mcg by mouth daily.     Allergies:   Kiwi extract and Other   ROS:   Please see the history of present illness.     All other systems reviewed and are negative.   Labs/Other Tests and Data Reviewed:    Recent Labs: 12/25/2019: ALT 38; BUN 13; Creat 0.76; Potassium 4.2; Sodium 139; TSH 1.16   Recent Lipid Panel Lab Results  Component Value Date/Time   CHOL 137 12/25/2019 07:09 AM   TRIG 77 12/25/2019 07:09 AM   HDL 56 12/25/2019 07:09 AM   CHOLHDL 2.4 12/25/2019 07:09 AM   LDLCALC 65 12/25/2019 07:09 AM    Wt Readings from Last 3 Encounters:  04/07/20 249 lb (112.9 kg)  04/05/20 250 lb (113.4 kg)  01/01/20 (!) 253 lb 6.4 oz (114.9 kg)     Objective:    Vital Signs:  BP 134/78   Temp 97.6 F (36.4 C)   Ht 5\' 5"  (1.651 m)   Wt 249 lb (112.9 kg)   BMI 41.44 kg/m    VITAL SIGNS:  reviewed GEN:  no acute distress RESPIRATORY:  No shortness of breath noted in  conversation PSYCH:  Normal affect and mood  ASSESSMENT & PLAN:    1. Essential hypertension, benign   2. Uncontrolled type 2 diabetes mellitus with complication, with long-term current use of insulin ([place])   3. Morbid obesity with body mass index (BMI) of 40.0 to 44.9 in adult [facility])    Time:   Today, I have spent 6 minutes with the patient with telehealth technology discussing the above problems.     Medication Adjustments/Labs and Tests Ordered: Current medicines are reviewed at length with the patient today.  Concerns regarding medicines are outlined above.   Tests Ordered: No orders of the defined types were placed in this encounter.   Medication Changes: Meds ordered this encounter  Medications  . ketoconazole (NIZORAL) 2 % cream    Sig: Apply 1 application topically daily as needed for irritation.    Dispense:  15 g    Refill:  0    Order Specific Question:   Supervising Provider    Answer:   Fayrene Helper [[id number]]     Note: This dictation was prepared with Dragon dictation along with smaller phrase technology. Similar sounding words can be transcribed inadequately or may not be corrected upon review. Any transcriptional errors that result from this process are unintentional.      Disposition:  Follow up as needed Signed, Sandy Mayo, NP  04/07/2020 10:10 AM     [place]

## 2020-04-20 ENCOUNTER — Other Ambulatory Visit: Payer: Self-pay | Admitting: "Endocrinology

## 2020-04-20 NOTE — Telephone Encounter (Signed)
Please advise, I didn't see it in last office visit

## 2020-05-09 ENCOUNTER — Other Ambulatory Visit: Payer: Self-pay | Admitting: Family Medicine

## 2020-05-09 DIAGNOSIS — E1165 Type 2 diabetes mellitus with hyperglycemia: Secondary | ICD-10-CM

## 2020-05-09 DIAGNOSIS — IMO0002 Reserved for concepts with insufficient information to code with codable children: Secondary | ICD-10-CM

## 2020-05-20 ENCOUNTER — Other Ambulatory Visit: Payer: Self-pay | Admitting: Allergy & Immunology

## 2020-06-07 ENCOUNTER — Other Ambulatory Visit: Payer: Self-pay | Admitting: Family Medicine

## 2020-06-16 DIAGNOSIS — Z79891 Long term (current) use of opiate analgesic: Secondary | ICD-10-CM | POA: Diagnosis not present

## 2020-06-16 DIAGNOSIS — M545 Low back pain, unspecified: Secondary | ICD-10-CM | POA: Diagnosis not present

## 2020-06-16 DIAGNOSIS — M542 Cervicalgia: Secondary | ICD-10-CM | POA: Diagnosis not present

## 2020-06-16 DIAGNOSIS — I10 Essential (primary) hypertension: Secondary | ICD-10-CM | POA: Diagnosis not present

## 2020-06-16 DIAGNOSIS — M79606 Pain in leg, unspecified: Secondary | ICD-10-CM | POA: Diagnosis not present

## 2020-06-22 ENCOUNTER — Other Ambulatory Visit: Payer: Self-pay | Admitting: Nurse Practitioner

## 2020-06-28 DIAGNOSIS — L03032 Cellulitis of left toe: Secondary | ICD-10-CM | POA: Diagnosis not present

## 2020-06-28 DIAGNOSIS — L600 Ingrowing nail: Secondary | ICD-10-CM | POA: Diagnosis not present

## 2020-06-28 DIAGNOSIS — L03031 Cellulitis of right toe: Secondary | ICD-10-CM | POA: Diagnosis not present

## 2020-06-28 DIAGNOSIS — B351 Tinea unguium: Secondary | ICD-10-CM | POA: Diagnosis not present

## 2020-07-04 ENCOUNTER — Telehealth: Payer: Self-pay

## 2020-07-04 DIAGNOSIS — E039 Hypothyroidism, unspecified: Secondary | ICD-10-CM

## 2020-07-04 DIAGNOSIS — I10 Essential (primary) hypertension: Secondary | ICD-10-CM

## 2020-07-04 NOTE — Telephone Encounter (Signed)
Linda with [facility] called and said that the patient came to get labs but did not have her order. She went ahead and drew her anyway, can you change the order to [facility].

## 2020-07-04 NOTE — Telephone Encounter (Signed)
Orders sent to [facility]

## 2020-07-05 LAB — COMPREHENSIVE METABOLIC PANEL
AG Ratio: 1.8 (calc) (ref 1.0–2.5)
ALT: 37 U/L — ABNORMAL HIGH (ref 6–29)
AST: 22 U/L (ref 10–35)
Albumin: 4.4 g/dL (ref 3.6–5.1)
Alkaline phosphatase (APISO): 86 U/L (ref 37–153)
BUN: 13 mg/dL (ref 7–25)
CO2: 31 mmol/L (ref 20–32)
Calcium: 9.6 mg/dL (ref 8.6–10.4)
Chloride: 103 mmol/L (ref 98–110)
Creat: 0.69 mg/dL (ref 0.50–1.05)
Globulin: 2.5 g/dL (calc) (ref 1.9–3.7)
Glucose, Bld: 194 mg/dL — ABNORMAL HIGH (ref 65–99)
Potassium: 4.5 mmol/L (ref 3.5–5.3)
Sodium: 142 mmol/L (ref 135–146)
Total Bilirubin: 0.4 mg/dL (ref 0.2–1.2)
Total Protein: 6.9 g/dL (ref 6.1–8.1)

## 2020-07-05 LAB — TSH: TSH: 0.68 mIU/L (ref 0.40–4.50)

## 2020-07-05 LAB — T4, FREE: Free T4: 1.3 ng/dL (ref 0.8–1.8)

## 2020-07-08 ENCOUNTER — Other Ambulatory Visit: Payer: Self-pay | Admitting: Family Medicine

## 2020-07-08 DIAGNOSIS — IMO0002 Reserved for concepts with insufficient information to code with codable children: Secondary | ICD-10-CM

## 2020-07-08 DIAGNOSIS — E1165 Type 2 diabetes mellitus with hyperglycemia: Secondary | ICD-10-CM

## 2020-07-12 ENCOUNTER — Ambulatory Visit (INDEPENDENT_AMBULATORY_CARE_PROVIDER_SITE_OTHER): Payer: Medicaid Other | Admitting: Nurse Practitioner

## 2020-07-12 ENCOUNTER — Encounter: Payer: Self-pay | Admitting: Nurse Practitioner

## 2020-07-12 ENCOUNTER — Other Ambulatory Visit: Payer: Self-pay

## 2020-07-12 VITALS — BP 167/88 | HR 66 | Ht 65.0 in | Wt 246.0 lb

## 2020-07-12 DIAGNOSIS — E782 Mixed hyperlipidemia: Secondary | ICD-10-CM

## 2020-07-12 DIAGNOSIS — E559 Vitamin D deficiency, unspecified: Secondary | ICD-10-CM | POA: Diagnosis not present

## 2020-07-12 DIAGNOSIS — IMO0002 Reserved for concepts with insufficient information to code with codable children: Secondary | ICD-10-CM

## 2020-07-12 DIAGNOSIS — E118 Type 2 diabetes mellitus with unspecified complications: Secondary | ICD-10-CM | POA: Diagnosis not present

## 2020-07-12 DIAGNOSIS — I10 Essential (primary) hypertension: Secondary | ICD-10-CM

## 2020-07-12 DIAGNOSIS — E039 Hypothyroidism, unspecified: Secondary | ICD-10-CM | POA: Diagnosis not present

## 2020-07-12 DIAGNOSIS — E1165 Type 2 diabetes mellitus with hyperglycemia: Secondary | ICD-10-CM | POA: Diagnosis not present

## 2020-07-12 DIAGNOSIS — Z794 Long term (current) use of insulin: Secondary | ICD-10-CM | POA: Diagnosis not present

## 2020-07-12 LAB — POCT GLYCOSYLATED HEMOGLOBIN (HGB A1C): HbA1c, POC (controlled diabetic range): 8.4 % — AB (ref 0.0–7.0)

## 2020-07-12 NOTE — Patient Instructions (Signed)

## 2020-07-12 NOTE — Progress Notes (Signed)
07/12/2020   Endocrinology follow-up note   Subjective:    Patient ID: Sandy Valencia, female    DOB: 1961-02-05, PCP Perlie Mayo, NP   Past Medical History:  Diagnosis Date  . Allergic rhinitis due to pollen   . 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 Anaphylactic shock, unspecified, subsequent encounter   . Anemia, iron deficiency 02/14/2015  . Angio-edema   . Arthritis   . Asthma   . Asymptomatic varicose veins of right lower extremity   . Chronic obstructive pulmonary disease with (acute) exacerbation ([place])   . Chronic rhinitis 05/21/2018  . COVID-19   . Diabetes mellitus   . Essential (primary) hypertension   . Family history of colon cancer 09/03/2012  . Fatty liver   . Gastro-esophageal reflux disease without esophagitis   . GERD (gastroesophageal reflux disease)   . History of colonic polyps 08/26/2017  . Hypercholesteremia   . Hypertension   . Hypothyroidism   . Low back pain   . Mass of finger, right s/p surgical excision (mucoid cyst) 04/17/18 04/22/2018  . Mucous cyst of digit of right hand   . Obesity, unspecified   . Other adverse food reactions, not elsewhere classified, initial encounter   . Personal history of noncompliance with medical treatment, presenting hazards to health 05/18/2015  . Rash and other nonspecific skin eruption   . Sleep apnea    pt siad, "i had small amount but could not tolerate CPAP and they said it was ok since it was mild.  . Sleep apnea, unspecified   . Unspecified asthma, uncomplicated   . Zoster without complications    Past Surgical History:  Procedure Laterality Date  . ABDOMINAL HYSTERECTOMY    . CHOLECYSTECTOMY    . COLONOSCOPY N/A 09/22/2012   RMR: colonic polyps -removed as described above. tubular adenoma, next TCS 09/2017  . COLONOSCOPY WITH PROPOFOL N/A 09/23/2017   Procedure: COLONOSCOPY WITH PROPOFOL;  Surgeon: Daneil Dolin, MD;  [facility]: [facility];  Service: Endoscopy;  Laterality:  N/A;  7:30am  . CYST EXCISION Left 07/19/2016   Procedure: CYST REMOVAL LEFT RING FINGER;  Surgeon: Carole Civil, MD;  Location: [facility];  Service: Orthopedics;  Laterality: Left;  . CYST REMOVAL LEG Right    foot  . ESOPHAGOGASTRODUODENOSCOPY N/A 10/21/2014   RMR: Small hiatal hernia; otherwise normal EGD status post passage of a Maloney dilator.   Marland Kitchen EXCISION MASS UPPER EXTREMETIES Right 04/17/2018   Procedure: EXCISION MASS UPPER EXTREMETIES right ring finger;  Surgeon: Carole Civil, MD;  Location: [facility];  Service: Orthopedics;  Laterality: Right;  . MALONEY DILATION N/A 10/21/2014   Procedure: [name] DILATION;  Surgeon: Daneil Dolin, MD;  Location: [facility];  Service: Endoscopy;  Laterality: N/A;  . MIDDLE EAR SURGERY Right    patched hole in ear drum  . POLYPECTOMY  09/23/2017   Procedure: POLYPECTOMY;  Surgeon: Daneil Dolin, MD;  Location: [facility];  Service: Endoscopy;;  polyp hepatic flexure polyp cs, splenic flexure polyp cs, rectal polyp cs   Social History   Socioeconomic History  . Marital status: Divorced    Spouse name: Not on file  . Number of children: 2  . Years of education: Not on file  . Highest education level: Not on file  Occupational History  . Not on file  Tobacco Use  . Smoking status: Never Smoker  . Smokeless tobacco: Never Used  Vaping Use  .  Vaping Use: Never used  Substance and Sexual Activity  . Alcohol use: No    Alcohol/week: 0.0 standard drinks  . Drug use: No  . Sexual activity: Yes    Birth control/protection: Surgical  Other Topics Concern  . Not on file  Social History Narrative   Lives with grandson (since born)      Enjoys: vacation, travel, concerts      Diet: eats all food groups    Caffeine: diet Pepsi daily    Water:  8 cups daily       Wears seat belt    Does use phone   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    Social Determinants of Health   Financial Resource Strain: Low Risk   .  Difficulty of Paying Living Expenses: Not hard at all  Food Insecurity: No Food Insecurity  . Worried About Charity fundraiser in the Last Year: Never true  . Ran Out of Food in the Last Year: Never true  Transportation Needs: No Transportation Needs  . Lack of Transportation (Medical): No  . Lack of Transportation (Non-Medical): No  Physical Activity: Insufficiently Active  . Days of Exercise per Week: 2 days  . Minutes of Exercise per Session: 20 min  Stress: Not on file  Social Connections: Moderately Isolated  . Frequency of Communication with Friends and Family: More than three times a week  . Frequency of Social Gatherings with Friends and Family: More than three times a week  . Attends Religious Services: More than 4 times per year  . Active Member of Clubs or Organizations: No  . Attends Archivist Meetings: Never  . Marital Status: Divorced   Outpatient Encounter Medications as of 07/12/2020  Medication Sig  . Accu-Chek FastClix Lancets MISC USE TO TEST 4 TIMES DAILY.  Marland Kitchen albuterol (VENTOLIN HFA) 108 (90 Base) MCG/ACT inhaler Inhale 4 puffs into the lungs every 6 (six) hours as needed for wheezing or shortness of breath.  Marland Kitchen aspirin EC 81 MG tablet Take 81 mg by mouth daily.  . Cholecalciferol (VITAMIN D-3) 1000 UNITS CAPS Take 1,000 Units by mouth daily.   . diclofenac Sodium (VOLTAREN) 1 % GEL Apply 2 g topically 2 (two) times daily as needed.  . Diclofenac Sodium 3 % GEL APPLY 2 GRAMS TO AFFECTED AREAS 2 TIMES A DAY AS NEEDED  . EPINEPHrine 0.3 mg/0.3 mL IJ SOAJ injection Inject 0.3 mg into the muscle as needed for anaphylaxis.  Marland Kitchen glipiZIDE (GLUCOTROL XL) 10 MG 24 hr tablet Take 1 tablet (10 mg total) by mouth daily with breakfast.  . glucose blood (ACCU-CHEK GUIDE) test strip USE TO TEST 4 TIMES DAILY.  Marland Kitchen HYDROcodone-acetaminophen (NORCO) 7.5-325 MG tablet Take by mouth.  . insulin aspart (NOVOLOG FLEXPEN) 100 UNIT/ML FlexPen Inject 22-28 Units into the skin 3  (three) times daily with meals.  Marland Kitchen ketoconazole (NIZORAL) 2 % cream Apply 1 application topically daily as needed for irritation.  Marland Kitchen LANTUS SOLOSTAR 100 UNIT/ML Solostar Pen INJECT 80 UNITS SUBCUTANEOUSLY INTO THE SKIN DAILY.  Marland Kitchen levothyroxine (SYNTHROID) 75 MCG tablet TAKE ONE (1) TABLET BY MOUTH EVERY DAY BEFORE BREAKFAST  . lisinopril (ZESTRIL) 5 MG tablet Take 5 mg by mouth daily.  Marland Kitchen LITETOUCH PEN NEEDLES 31G X 8 MM MISC USE AS DIRECTED FOUR TIMES DAILY.  . metFORMIN (GLUCOPHAGE-XR) 500 MG 24 hr tablet TAKE ONE TABLET BY MOUTH TWICE A DAY AFTER A MEAL  . pantoprazole (PROTONIX) 40 MG tablet  TAKE ONE TABLET (40MG  TOTAL) BY MOUTH DAILY  . pregabalin (LYRICA) 150 MG capsule Take 150 mg by mouth 2 (two) times daily.  . simvastatin (ZOCOR) 10 MG tablet TAKE ONE (1) TABLET BY MOUTH EVERY DAY  . vitamin B-12 (CYANOCOBALAMIN) 500 MCG tablet Take 500 mcg by mouth daily.  . [DISCONTINUED] HYDROcodone-acetaminophen (NORCO) 10-325 MG tablet Take 1 tablet by mouth every 6 (six) hours as needed.  . [DISCONTINUED] Semaglutide, 1 MG/DOSE, (OZEMPIC, 1 MG/DOSE,) 2 MG/1.5ML SOPN Inject 1 mg into the skin once a week.   No facility-administered encounter medications on file as of 07/12/2020.   ALLERGIES: Allergies  Allergen Reactions  . Kiwi Extract Anaphylaxis, Swelling and Palpitations  . Other Anaphylaxis    Walnuts (avoids all tree nuts)   VACCINATION STATUS: Immunization History  Administered Date(s) Administered  . Influenza,inj,Quad PF,6+ Mos 03/30/2019  . Influenza-Unspecified 04/17/2011, 03/24/2012, 05/12/2013, 02/23/2015, 04/04/2017, 04/08/2018  . Moderna Sars-Covid-2 Vaccination 10/22/2019, 11/19/2019    Diabetes Sandy Valencia presents for her follow-up diabetic visit. Sandy Valencia has type 2 diabetes mellitus. Her disease course has been improving (Sandy Valencia was diagnosed at approximate age of 82 years.). Pertinent negatives for hypoglycemia include no confusion, nervousness/anxiousness, pallor, seizures or tremors.  Associated symptoms include polydipsia and polyuria. Pertinent negatives for diabetes include no fatigue, no polyphagia and no weight loss. There are no hypoglycemic complications. Symptoms are improving. There are no diabetic complications. Risk factors for coronary artery disease include dyslipidemia, diabetes mellitus, hypertension, sedentary lifestyle, obesity, post-menopausal and stress. Current diabetic treatment includes insulin injections and oral agent (dual therapy). Sandy Valencia is compliant with treatment most of the time. Her weight is fluctuating minimally. Sandy Valencia is following a generally unhealthy diet. When asked about meal planning, Sandy Valencia reported none. Sandy Valencia has had a previous visit with a dietitian. Sandy Valencia never participates in exercise. Her home blood glucose trend is decreasing steadily. Her breakfast blood glucose range is generally 140-180 mg/dl. Her lunch blood glucose range is generally 180-200 mg/dl. Her dinner blood glucose range is generally 180-200 mg/dl. Her bedtime blood glucose range is generally >200 mg/dl. Her overall blood glucose range is 180-200 mg/dl. (Sandy Valencia presents today with her meter and logs showing improved, yet still above target fasting and postprandial glycemic profile overall.  Her POCT A1c today is 8.4%, improving since last visit of 8.9%.  Her insurance denied coverage for her Ozempic and Sandy Valencia finished her clinic samples about 1 month ago.  Analysis of her meter shows 7-day average of 165, 14-day average of 191, 30-day average of 194, and 90-day aerage of 191.  Sandy Valencia denies any hypoglycemia.) An ACE inhibitor/angiotensin II receptor blocker is being taken. Sandy Valencia sees a podiatrist.Eye exam is current.  Hyperlipidemia This is a chronic problem. The current episode started more than 1 year ago. The problem is controlled. Recent lipid tests were reviewed and are normal. Exacerbating diseases include chronic renal disease, diabetes, hypothyroidism and obesity. There are no known factors  aggravating her hyperlipidemia. Pertinent negatives include no myalgias. Current antihyperlipidemic treatment includes statins. The current treatment provides significant improvement of lipids. Compliance problems include medication cost.  Risk factors for coronary artery disease include diabetes mellitus, dyslipidemia, hypertension, obesity, a sedentary lifestyle and post-menopausal.  Hypertension This is a chronic problem. The current episode started more than 1 year ago. The problem has been waxing and waning since onset. The problem is uncontrolled. Pertinent negatives include no palpitations. Agents associated with hypertension include thyroid hormones. Risk factors for coronary artery disease include diabetes mellitus, dyslipidemia, obesity, post-menopausal state,  sedentary lifestyle and stress. Past treatments include ACE inhibitors. The current treatment provides mild improvement. There are no compliance problems.  Identifiable causes of hypertension include chronic renal disease, sleep apnea and a thyroid problem.  Thyroid Problem Presents for follow-up visit. Patient reports no anxiety, cold intolerance, constipation, depressed mood, diarrhea, dry skin, fatigue, hair loss, heat intolerance, palpitations, tremors, weight gain or weight loss. Her past medical history is significant for diabetes and hyperlipidemia.     Review of systems  Constitutional: + Minimally fluctuating body weight,  current Body mass index is 40.94 kg/m. , no fatigue, no subjective hyperthermia, no subjective hypothermia Eyes: no blurry vision, no xerophthalmia ENT: no sore throat, no nodules palpated in throat, no dysphagia/odynophagia, no hoarseness Cardiovascular: no chest pain, no shortness of breath, no palpitations, no leg swelling Respiratory: no cough, no shortness of breath Gastrointestinal: no nausea/vomiting/diarrhea Musculoskeletal: no muscle/joint aches Skin: no rashes, no hyperemia Neurological: no  tremors, no numbness, no tingling, no dizziness Psychiatric: no depression, no anxiety   Objective:    BP (!) 167/88 (BP Location: Left Arm, Patient Position: Sitting)   Pulse 66   Ht 5\' 5"  (1.651 m)   Wt 246 lb (111.6 kg)   BMI 40.94 kg/m   Wt Readings from Last 3 Encounters:  07/12/20 246 lb (111.6 kg)  04/07/20 249 lb (112.9 kg)  04/05/20 250 lb (113.4 kg)    BP Readings from Last 3 Encounters:  07/12/20 (!) 167/88  04/07/20 134/78  04/06/20 (!) 140/92    Physical Exam- Limited  Constitutional:  Body mass index is 40.94 kg/m. , not in acute distress, normal state of mind Eyes:  EOMI, no exophthalmos Neck: Supple Cardiovascular: RRR, no murmers, rubs, or gallops, no edema Respiratory: Adequate breathing efforts, no crackles, rales, rhonchi, or wheezing Musculoskeletal: no gross deformities, strength intact in all four extremities, no gross restriction of joint movements Skin:  no rashes, no hyperemia Neurological: no tremor with outstretched hands    Lipid Panel     Component Value Date/Time   CHOL 137 12/25/2019 0709   TRIG 77 12/25/2019 0709   HDL 56 12/25/2019 0709   CHOLHDL 2.4 12/25/2019 0709   VLDL 10 03/07/2016 0705   LDLCALC 65 12/25/2019 0709    Recent Results (from the past 2160 hour(s))  Comprehensive metabolic panel     Status: Abnormal   Collection Time: 07/04/20  9:05 AM  Result Value Ref Range   Glucose, Bld 194 (H) 65 - 99 mg/dL    Comment: .            Fasting reference interval . For someone without known diabetes, a glucose value >125 mg/dL indicates that they may have diabetes and this should be confirmed with a follow-up test. .    BUN 13 7 - 25 mg/dL   Creat 0.69 0.50 - 1.05 mg/dL    Comment: For patients >90 years of age, the reference limit for Creatinine is approximately 13% higher for people identified as African-American. .    BUN/Creatinine Ratio NOT APPLICABLE 6 - 22 (calc)   Sodium 142 135 - 146 mmol/L   Potassium  4.5 3.5 - 5.3 mmol/L   Chloride 103 98 - 110 mmol/L   CO2 31 20 - 32 mmol/L   Calcium 9.6 8.6 - 10.4 mg/dL   Total Protein 6.9 6.1 - 8.1 g/dL   Albumin 4.4 3.6 - 5.1 g/dL   Globulin 2.5 1.9 - 3.7 g/dL (calc)   AG Ratio 1.8 1.0 -  2.5 (calc)   Total Bilirubin 0.4 0.2 - 1.2 mg/dL   Alkaline phosphatase (APISO) 86 37 - 153 U/L   AST 22 10 - 35 U/L   ALT 37 (H) 6 - 29 U/L  T4, Free     Status: None   Collection Time: 07/04/20  9:05 AM  Result Value Ref Range   Free T4 1.3 0.8 - 1.8 ng/dL  TSH     Status: None   Collection Time: 07/04/20  9:05 AM  Result Value Ref Range   TSH 0.68 0.40 - 4.50 mIU/L  HgB A1c     Status: Abnormal   Collection Time: 07/12/20 10:30 AM  Result Value Ref Range   Hemoglobin A1C     HbA1c POC (<> result, manual entry)     HbA1c, POC (prediabetic range)     HbA1c, POC (controlled diabetic range) 8.4 (A) 0.0 - 7.0 %     Assessment & Plan:   1) Uncontrolled type 2 diabetes mellitus with complication, with long-term current use of insulin ([place])  Sandy Valencia presents today with her meter and logs showing improved, yet still above target fasting and postprandial glycemic profile overall.  Her POCT A1c today is 8.4%, improving since last visit of 8.9%.  Her insurance denied coverage for her Ozempic and Sandy Valencia finished her clinic samples about 1 month ago.  Analysis of her meter shows 7-day average of 165, 14-day average of 191, 30-day average of 194, and 90-day average of 191.  Sandy Valencia denies any hypoglycemia.  -Her  diabetes is  complicated by history of noncompliance (Sandy Valencia recently showed better engagement) and patient remains at  extremely high risk for more acute and chronic complications of diabetes which include CAD, CVA, CKD, retinopathy, and neuropathy. These are all discussed in detail with the patient.  - Nutritional counseling repeated at each appointment due to patients tendency to fall back in to old habits.  - The patient admits there is a room for improvement in  their diet and drink choices. -  Suggestion is made for the patient to avoid simple carbohydrates from their diet including Cakes, Sweet Desserts / Pastries, Ice Cream, Soda (diet and regular), Sweet Tea, Candies, Chips, Cookies, Sweet Pastries,  Store Bought Juices, Alcohol in Excess of  1-2 drinks a day, Artificial Sweeteners, Coffee Creamer, and "Sugar-free" Products. This will help patient to have stable blood glucose profile and potentially avoid unintended weight gain.   - I encouraged the patient to switch to  unprocessed or minimally processed complex starch and increased protein intake (animal or plant source), fruits, and vegetables.   - Patient is advised to stick to a routine mealtimes to eat 3 meals  a day and avoid unnecessary snacks ( to snack only to correct hypoglycemia).  - I have approached patient with the following individualized plan to manage diabetes and patient agrees.  -Sandy Valencia will continue to require intensive treatment with  basal/bolus insulin in order for her to achieve and maintain control of diabetes to target.  -Given her improvement in her glycemic profile and her promises to do better with diet and exercise, no changes will be made to her medication regimen today.  Sandy Valencia is advised to continue Lantus 80 units SQ daily at bedtime, continue Novolog 22-28 units TID with meals if glucose is above 90 and Sandy Valencia is eating.  Specific instructions on how to titrate her insulin dose based on her glucose readings given to patient in writing.  Sandy Valencia is also advised to continue  Glipizide 10 mg XL daily with breakfast and Metformin 500 mg ER po twice daily with meals.  Sandy Valencia can stay off of the Ozempic at this time due to insurance coverage reasons.  -Sandy Valencia is encouraged to continue monitoring blood glucose 4 times daily, before meals and before bed, and to call the clinic if Sandy Valencia has readings less than 70 or greater than 300 for 3 tests in a row.  2) Lipids/HPL:  Her most recent lipid panel  from 12/25/19 shows controlled LDL of 65.  Sandy Valencia is advised to continue Simvastatin 20 mg po daily at bedtime.  Side effects and precautions discussed with her.  3) Hypertension:  Her blood pressure is not controlled to target based on today's reading.  Sandy Valencia is advised to continue Lisinopril 5 mg po daily (managed by her PCP).  4) Hypothyroidism:  -Her recent thyroid function tests are consistent with appropriate hormone replacement.  Sandy Valencia is advised to continue Levothyroxine 75 mcg po daily before breakfast.   - We discussed about the correct intake of her thyroid hormone, on empty stomach at fasting, with water, separated by at least 30 minutes from breakfast and other medications,  and separated by more than 4 hours from calcium, iron, multivitamins, acid reflux medications (PPIs). -Patient is made aware of the fact that thyroid hormone replacement is needed for life, dose to be adjusted by periodic monitoring of thyroid function tests.    - I advised patient to maintain close follow up with Perlie Mayo, NP for primary care needs.  - Time spent on this patient care encounter:  35 min, of which > 50% was spent in  counseling and the rest reviewing her blood glucose logs , discussing her hypoglycemia and hyperglycemia episodes, reviewing her current and  previous labs / studies  ( including abstraction from other facilities) and medications  doses and developing a  long term treatment plan and documenting her care.   Please refer to Patient Instructions for Blood Glucose Monitoring and Insulin/Medications Dosing Guide"  in media tab for additional information. Please  also refer to " Patient Self Inventory" in the Media  tab for reviewed elements of pertinent patient history.  Sandy Valencia participated in the discussions, expressed understanding, and voiced agreement with the above plans.  All questions were answered to her satisfaction. Sandy Valencia is encouraged to contact clinic should Sandy Valencia have any  questions or concerns prior to her return visit.  Follow up plan: -Return in about 3 months (around 10/09/2020) for Diabetes follow up with A1c in office, Thyroid follow up, Previsit labs, Bring glucometer and logs.  Rayetta Pigg, [facility] [address] [place], [place] [address] Phone: [phone] Fax: [phone]   07/12/2020, 11:07 AM

## 2020-07-14 ENCOUNTER — Telehealth: Payer: Self-pay

## 2020-07-14 ENCOUNTER — Other Ambulatory Visit: Payer: Self-pay

## 2020-07-14 DIAGNOSIS — E1165 Type 2 diabetes mellitus with hyperglycemia: Secondary | ICD-10-CM

## 2020-07-14 DIAGNOSIS — IMO0002 Reserved for concepts with insufficient information to code with codable children: Secondary | ICD-10-CM

## 2020-07-14 MED ORDER — ACCU-CHEK GUIDE VI STRP: ORAL_STRIP | 3 refills | Status: DC

## 2020-07-14 NOTE — Telephone Encounter (Signed)
Rx sent in

## 2020-07-14 NOTE — Telephone Encounter (Signed)
Pt is calling to get a script sent in for Diabetic strips to [facility]

## 2020-07-22 ENCOUNTER — Other Ambulatory Visit: Payer: Self-pay | Admitting: Nurse Practitioner

## 2020-07-22 ENCOUNTER — Other Ambulatory Visit: Payer: Self-pay | Admitting: Family Medicine

## 2020-07-22 ENCOUNTER — Other Ambulatory Visit: Payer: Self-pay | Admitting: "Endocrinology

## 2020-07-22 DIAGNOSIS — IMO0002 Reserved for concepts with insufficient information to code with codable children: Secondary | ICD-10-CM

## 2020-07-22 DIAGNOSIS — E1165 Type 2 diabetes mellitus with hyperglycemia: Secondary | ICD-10-CM

## 2020-08-09 ENCOUNTER — Other Ambulatory Visit: Payer: Self-pay | Admitting: Family Medicine

## 2020-08-09 DIAGNOSIS — E1165 Type 2 diabetes mellitus with hyperglycemia: Secondary | ICD-10-CM

## 2020-08-09 DIAGNOSIS — IMO0002 Reserved for concepts with insufficient information to code with codable children: Secondary | ICD-10-CM

## 2020-08-19 ENCOUNTER — Other Ambulatory Visit: Payer: Self-pay | Admitting: Nurse Practitioner

## 2020-08-24 ENCOUNTER — Other Ambulatory Visit: Payer: Self-pay | Admitting: "Endocrinology

## 2020-09-06 DIAGNOSIS — Z79891 Long term (current) use of opiate analgesic: Secondary | ICD-10-CM | POA: Diagnosis not present

## 2020-09-06 DIAGNOSIS — M79606 Pain in leg, unspecified: Secondary | ICD-10-CM | POA: Diagnosis not present

## 2020-09-06 DIAGNOSIS — M542 Cervicalgia: Secondary | ICD-10-CM | POA: Diagnosis not present

## 2020-09-06 DIAGNOSIS — M545 Low back pain, unspecified: Secondary | ICD-10-CM | POA: Diagnosis not present

## 2020-09-06 DIAGNOSIS — I10 Essential (primary) hypertension: Secondary | ICD-10-CM | POA: Diagnosis not present

## 2020-09-08 ENCOUNTER — Other Ambulatory Visit: Payer: Self-pay | Admitting: Family Medicine

## 2020-09-08 DIAGNOSIS — E1165 Type 2 diabetes mellitus with hyperglycemia: Secondary | ICD-10-CM

## 2020-09-08 DIAGNOSIS — IMO0002 Reserved for concepts with insufficient information to code with codable children: Secondary | ICD-10-CM

## 2020-09-10 ENCOUNTER — Other Ambulatory Visit: Payer: Self-pay | Admitting: Family Medicine

## 2020-09-19 ENCOUNTER — Other Ambulatory Visit: Payer: Self-pay | Admitting: Allergy & Immunology

## 2020-09-27 DIAGNOSIS — L03031 Cellulitis of right toe: Secondary | ICD-10-CM | POA: Diagnosis not present

## 2020-09-27 DIAGNOSIS — H7011 Chronic mastoiditis, right ear: Secondary | ICD-10-CM | POA: Diagnosis not present

## 2020-09-27 DIAGNOSIS — B351 Tinea unguium: Secondary | ICD-10-CM | POA: Diagnosis not present

## 2020-09-27 DIAGNOSIS — L03032 Cellulitis of left toe: Secondary | ICD-10-CM | POA: Diagnosis not present

## 2020-10-06 DIAGNOSIS — E559 Vitamin D deficiency, unspecified: Secondary | ICD-10-CM | POA: Diagnosis not present

## 2020-10-06 DIAGNOSIS — E039 Hypothyroidism, unspecified: Secondary | ICD-10-CM | POA: Diagnosis not present

## 2020-10-06 LAB — TSH: TSH: 0.62 mIU/L (ref 0.40–4.50)

## 2020-10-06 LAB — VITAMIN D 25 HYDROXY (VIT D DEFICIENCY, FRACTURES): Vit D, 25-Hydroxy: 29 ng/mL — ABNORMAL LOW (ref 30–100)

## 2020-10-06 LAB — T4, FREE: Free T4: 1.5 ng/dL (ref 0.8–1.8)

## 2020-10-10 ENCOUNTER — Other Ambulatory Visit: Payer: Self-pay | Admitting: Family Medicine

## 2020-10-10 DIAGNOSIS — E1165 Type 2 diabetes mellitus with hyperglycemia: Secondary | ICD-10-CM

## 2020-10-10 DIAGNOSIS — IMO0002 Reserved for concepts with insufficient information to code with codable children: Secondary | ICD-10-CM

## 2020-10-11 ENCOUNTER — Encounter: Payer: Self-pay | Admitting: Nurse Practitioner

## 2020-10-11 ENCOUNTER — Other Ambulatory Visit: Payer: Self-pay

## 2020-10-11 ENCOUNTER — Ambulatory Visit (INDEPENDENT_AMBULATORY_CARE_PROVIDER_SITE_OTHER): Payer: Medicaid Other | Admitting: Nurse Practitioner

## 2020-10-11 VITALS — BP 153/68 | HR 72 | Ht 65.0 in | Wt 247.0 lb

## 2020-10-11 DIAGNOSIS — E1165 Type 2 diabetes mellitus with hyperglycemia: Secondary | ICD-10-CM | POA: Diagnosis not present

## 2020-10-11 DIAGNOSIS — IMO0002 Reserved for concepts with insufficient information to code with codable children: Secondary | ICD-10-CM

## 2020-10-11 DIAGNOSIS — E118 Type 2 diabetes mellitus with unspecified complications: Secondary | ICD-10-CM

## 2020-10-11 DIAGNOSIS — E782 Mixed hyperlipidemia: Secondary | ICD-10-CM | POA: Diagnosis not present

## 2020-10-11 DIAGNOSIS — I10 Essential (primary) hypertension: Secondary | ICD-10-CM | POA: Diagnosis not present

## 2020-10-11 DIAGNOSIS — E559 Vitamin D deficiency, unspecified: Secondary | ICD-10-CM | POA: Diagnosis not present

## 2020-10-11 DIAGNOSIS — E039 Hypothyroidism, unspecified: Secondary | ICD-10-CM

## 2020-10-11 DIAGNOSIS — Z794 Long term (current) use of insulin: Secondary | ICD-10-CM | POA: Diagnosis not present

## 2020-10-11 LAB — POCT GLYCOSYLATED HEMOGLOBIN (HGB A1C): Hemoglobin A1C: 9.2 % — AB (ref 4.0–5.6)

## 2020-10-11 LAB — POCT UA - MICROALBUMIN
Albumin/Creatinine Ratio, Urine, POC: <30
Creatinine, POC: 300 mg/dL
Microalbumin Ur, POC: 30 mg/L

## 2020-10-11 NOTE — Patient Instructions (Signed)

## 2020-10-11 NOTE — Progress Notes (Signed)
10/11/2020   Endocrinology follow-up note   Subjective:    Patient ID: Sandy Valencia, female    DOB: 01-15-1961, PCP Perlie Mayo, NP   Past Medical History:  Diagnosis Date  . Allergic rhinitis due to pollen   . 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 Anaphylactic shock, unspecified, subsequent encounter   . Anemia, iron deficiency 02/14/2015  . Angio-edema   . Arthritis   . Asthma   . Asymptomatic varicose veins of right lower extremity   . Chronic obstructive pulmonary disease with (acute) exacerbation ([place])   . Chronic rhinitis 05/21/2018  . COVID-19   . Diabetes mellitus   . Essential (primary) hypertension   . Family history of colon cancer 09/03/2012  . Fatty liver   . Gastro-esophageal reflux disease without esophagitis   . GERD (gastroesophageal reflux disease)   . History of colonic polyps 08/26/2017  . Hypercholesteremia   . Hypertension   . Hypothyroidism   . Low back pain   . Mass of finger, right s/p surgical excision (mucoid cyst) 04/17/18 04/22/2018  . Mucous cyst of digit of right hand   . Obesity, unspecified   . Other adverse food reactions, not elsewhere classified, initial encounter   . Personal history of noncompliance with medical treatment, presenting hazards to health 05/18/2015  . Rash and other nonspecific skin eruption   . Sleep apnea    pt siad, "i had small amount but could not tolerate CPAP and they said it was ok since it was mild.  . Sleep apnea, unspecified   . Unspecified asthma, uncomplicated   . Zoster without complications    Past Surgical History:  Procedure Laterality Date  . ABDOMINAL HYSTERECTOMY    . CHOLECYSTECTOMY    . COLONOSCOPY N/A 09/22/2012   RMR: colonic polyps -removed as described above. tubular adenoma, next TCS 09/2017  . COLONOSCOPY WITH PROPOFOL N/A 09/23/2017   Procedure: COLONOSCOPY WITH PROPOFOL;  Surgeon: Daneil Dolin, MD;  [facility];  Service: Endoscopy;  Laterality:  N/A;  7:30am  . CYST EXCISION Left 07/19/2016   Procedure: CYST REMOVAL LEFT RING FINGER;  Surgeon: Carole Civil, MD;  Location: [facility];  Service: Orthopedics;  Laterality: Left;  . CYST REMOVAL LEG Right    foot  . ESOPHAGOGASTRODUODENOSCOPY N/A 10/21/2014   RMR: Small hiatal hernia; otherwise normal EGD status post passage of a Maloney dilator.   Marland Kitchen EXCISION MASS UPPER EXTREMETIES Right 04/17/2018   Procedure: EXCISION MASS UPPER EXTREMETIES right ring finger;  Surgeon: Carole Civil, MD;  Location: [facility];  Service: Orthopedics;  Laterality: Right;  . MALONEY DILATION N/A 10/21/2014   Procedure: [name] DILATION;  Surgeon: Daneil Dolin, MD;  Location: [facility];  Service: Endoscopy;  Laterality: N/A;  . MIDDLE EAR SURGERY Right    patched hole in ear drum  . POLYPECTOMY  09/23/2017   Procedure: POLYPECTOMY;  Surgeon: Daneil Dolin, MD;  Location: [facility];  Service: Endoscopy;;  polyp hepatic flexure polyp cs, splenic flexure polyp cs, rectal polyp cs   Social History   Socioeconomic History  . Marital status: Divorced    Spouse name: Not on file  . Number of children: 2  . Years of education: Not on file  . Highest education level: Not on file  Occupational History  . Not on file  Tobacco Use  . Smoking status: Never Smoker  . Smokeless tobacco: Never Used  Vaping Use  .  Vaping Use: Never used  Substance and Sexual Activity  . Alcohol use: No    Alcohol/week: 0.0 standard drinks  . Drug use: No  . Sexual activity: Yes    Birth control/protection: Surgical  Other Topics Concern  . Not on file  Social History Narrative   Lives with grandson (since born)      Enjoys: vacation, travel, concerts      Diet: eats all food groups    Caffeine: diet Pepsi daily    Water:  8 cups daily       Wears seat belt    Does use phone   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    Social Determinants of Health   Financial Resource Strain: Low Risk   .  Difficulty of Paying Living Expenses: Not hard at all  Food Insecurity: No Food Insecurity  . Worried About Charity fundraiser in the Last Year: Never true  . Ran Out of Food in the Last Year: Never true  Transportation Needs: No Transportation Needs  . Lack of Transportation (Medical): No  . Lack of Transportation (Non-Medical): No  Physical Activity: Insufficiently Active  . Days of Exercise per Week: 2 days  . Minutes of Exercise per Session: 20 min  Stress: Not on file  Social Connections: Moderately Isolated  . Frequency of Communication with Friends and Family: More than three times a week  . Frequency of Social Gatherings with Friends and Family: More than three times a week  . Attends Religious Services: More than 4 times per year  . Active Member of Clubs or Organizations: No  . Attends Archivist Meetings: Never  . Marital Status: Divorced   Outpatient Encounter Medications as of 10/11/2020  Medication Sig  . PROAIR HFA 108 (90 Base) MCG/ACT inhaler INHALE 4 PUFFS INTO THE LUNGS EVERY 6 HOURS AS NEEDED FOR WHEEZING OR SHORTNESS OF BREATH  . Accu-Chek FastClix Lancets MISC USE TO TEST 4 TIMES DAILY.  Marland Kitchen aspirin EC 81 MG tablet Take 81 mg by mouth daily.  . Cholecalciferol (VITAMIN D-3) 1000 UNITS CAPS Take 1,000 Units by mouth daily.   . diclofenac Sodium (VOLTAREN) 1 % GEL Apply 2 g topically 2 (two) times daily as needed.  . Diclofenac Sodium 3 % GEL APPLY 2 GRAMS TO AFFECTED AREAS 2 TIMES A DAY AS NEEDED  . EPINEPHrine 0.3 mg/0.3 mL IJ SOAJ injection Inject 0.3 mg into the muscle as needed for anaphylaxis.  Marland Kitchen glipiZIDE (GLUCOTROL XL) 10 MG 24 hr tablet Take 1 tablet (10 mg total) by mouth daily with breakfast.  . glucose blood (ACCU-CHEK GUIDE) test strip USE TO TEST 4 TIMES DAILY.  Marland Kitchen HYDROcodone-acetaminophen (NORCO) 7.5-325 MG tablet Take by mouth.  Marland Kitchen ketoconazole (NIZORAL) 2 % cream Apply 1 application topically daily as needed for irritation.  Marland Kitchen LANTUS  SOLOSTAR 100 UNIT/ML Solostar Pen INJECT 80 UNITS SUBCUTANEOUSLY INTO THE SKIN DAILY.  Marland Kitchen levothyroxine (SYNTHROID) 75 MCG tablet TAKE ONE (1) TABLET BY MOUTH EVERY DAY BEFORE BREAKFAST  . lisinopril (ZESTRIL) 5 MG tablet Take 5 mg by mouth daily.  Marland Kitchen LITETOUCH PEN NEEDLES 31G X 8 MM MISC USE AS DIRECTED FOUR TIMES DAILY.  . metFORMIN (GLUCOPHAGE-XR) 500 MG 24 hr tablet TAKE ONE TABLET BY MOUTH TWICE A DAY AFTER A MEAL  . NOVOLOG FLEXPEN 100 UNIT/ML FlexPen INJECT 22-28 UNITS INTO THE SKIN THREE TIMES DAILY WITH MEALS  . pantoprazole (PROTONIX) 40 MG tablet TAKE ONE TABLET (40MG  TOTAL)  BY MOUTH DAILY  . pregabalin (LYRICA) 150 MG capsule Take 150 mg by mouth 2 (two) times daily.  . simvastatin (ZOCOR) 10 MG tablet TAKE ONE (1) TABLET BY MOUTH EVERY DAY  . vitamin B-12 (CYANOCOBALAMIN) 500 MCG tablet Take 500 mcg by mouth daily.   No facility-administered encounter medications on file as of 10/11/2020.   ALLERGIES: Allergies  Allergen Reactions  . Kiwi Extract Anaphylaxis, Swelling and Palpitations  . Other Anaphylaxis    Walnuts (avoids all tree nuts)   VACCINATION STATUS: Immunization History  Administered Date(s) Administered  . Influenza,inj,Quad PF,6+ Mos 03/30/2019  . Influenza-Unspecified 04/17/2011, 03/24/2012, 05/12/2013, 02/23/2015, 04/04/2017, 04/08/2018  . Moderna Sars-Covid-2 Vaccination 10/22/2019, 11/19/2019    Diabetes She presents for her follow-up diabetic visit. She has type 2 diabetes mellitus. Onset time: She was diagnosed at approximate age of 62 years. Her disease course has been worsening. Pertinent negatives for hypoglycemia include no confusion, nervousness/anxiousness, pallor, seizures or tremors. Associated symptoms include polydipsia and polyuria. Pertinent negatives for diabetes include no fatigue, no polyphagia and no weight loss. There are no hypoglycemic complications. Symptoms are improving. There are no diabetic complications. Risk factors for coronary  artery disease include dyslipidemia, diabetes mellitus, hypertension, sedentary lifestyle, obesity, post-menopausal and stress. Current diabetic treatment includes insulin injections and oral agent (dual therapy). She is compliant with treatment most of the time. Her weight is fluctuating minimally. She is following a generally unhealthy diet. When asked about meal planning, she reported none. She has had a previous visit with a dietitian. She never participates in exercise. Her home blood glucose trend is fluctuating minimally. Her breakfast blood glucose range is generally 180-200 mg/dl. Her lunch blood glucose range is generally >200 mg/dl. Her dinner blood glucose range is generally >200 mg/dl. Her bedtime blood glucose range is generally >200 mg/dl. Her overall blood glucose range is >200 mg/dl. (She presents today with her meter and logs showing persistent fasting and postprandial hyperglycemia.  Her POCT A1c today is 9.2%, increasing from last visit of 8.4%.  She reports she has not had a soda in 3 months but has been craving sweets and indulging in them.  She denies any hypoglycemia.) An ACE inhibitor/angiotensin II receptor blocker is being taken. She sees a podiatrist.Eye exam is current.  Hyperlipidemia This is a chronic problem. The current episode started more than 1 year ago. The problem is controlled. Recent lipid tests were reviewed and are normal. Exacerbating diseases include chronic renal disease, diabetes, hypothyroidism and obesity. There are no known factors aggravating her hyperlipidemia. Pertinent negatives include no myalgias. Current antihyperlipidemic treatment includes statins. The current treatment provides significant improvement of lipids. Compliance problems include medication cost.  Risk factors for coronary artery disease include diabetes mellitus, dyslipidemia, hypertension, obesity, a sedentary lifestyle and post-menopausal.  Hypertension This is a chronic problem. The current  episode started more than 1 year ago. The problem has been waxing and waning since onset. The problem is uncontrolled. Pertinent negatives include no palpitations. Agents associated with hypertension include thyroid hormones. Risk factors for coronary artery disease include diabetes mellitus, dyslipidemia, obesity, post-menopausal state, sedentary lifestyle and stress. Past treatments include ACE inhibitors. The current treatment provides mild improvement. There are no compliance problems.  Identifiable causes of hypertension include chronic renal disease, sleep apnea and a thyroid problem.  Thyroid Problem Presents for follow-up visit. Patient reports no anxiety, cold intolerance, constipation, depressed mood, diarrhea, dry skin, fatigue, hair loss, heat intolerance, palpitations, tremors, weight gain or weight loss. Her past medical  history is significant for diabetes and hyperlipidemia.     Review of systems  Constitutional: + Minimally fluctuating body weight,  current Body mass index is 41.1 kg/m. , no fatigue, no subjective hyperthermia, no subjective hypothermia Eyes: no blurry vision, no xerophthalmia ENT: no sore throat, no nodules palpated in throat, no dysphagia/odynophagia, no hoarseness Cardiovascular: no chest pain, no shortness of breath, no palpitations, no leg swelling Respiratory: no cough, no shortness of breath Gastrointestinal: no nausea/vomiting/diarrhea Musculoskeletal: no muscle/joint aches Skin: no rashes, no hyperemia Neurological: no tremors, no numbness, no tingling, no dizziness Psychiatric: no depression, no anxiety   Objective:    BP (!) 153/68   Pulse 72   Ht 5\' 5"  (1.651 m)   Wt 247 lb (112 kg)   BMI 41.10 kg/m   Wt Readings from Last 3 Encounters:  10/11/20 247 lb (112 kg)  07/12/20 246 lb (111.6 kg)  04/07/20 249 lb (112.9 kg)    BP Readings from Last 3 Encounters:  10/11/20 (!) 153/68  07/12/20 (!) 167/88  04/07/20 134/78    Physical Exam-  Limited  Constitutional:  Body mass index is 41.1 kg/m. , not in acute distress, normal state of mind Eyes:  EOMI, no exophthalmos Neck: Supple Cardiovascular: RRR, no murmers, rubs, or gallops, no edema Respiratory: Adequate breathing efforts, no crackles, rales, rhonchi, or wheezing Musculoskeletal: no gross deformities, strength intact in all four extremities, no gross restriction of joint movements Skin:  no rashes, no hyperemia Neurological: no tremor with outstretched hands   POCT ABI Results 10/11/20   Right ABI:  1.19      Left ABI:  1.19  Right leg systolic / diastolic: XX123456 mmHg Left leg systolic / diastolic: 0000000 mmHg  Arm systolic / diastolic: 123456 mmHG  Detailed report will be scanned into patient chart.  Lipid Panel     Component Value Date/Time   CHOL 137 12/25/2019 0709   TRIG 77 12/25/2019 0709   HDL 56 12/25/2019 0709   CHOLHDL 2.4 12/25/2019 0709   VLDL 10 03/07/2016 0705   LDLCALC 65 12/25/2019 0709    Recent Results (from the past 2160 hour(s))  TSH     Status: None   Collection Time: 10/06/20 10:24 AM  Result Value Ref Range   TSH 0.62 0.40 - 4.50 mIU/L  T4, free     Status: None   Collection Time: 10/06/20 10:24 AM  Result Value Ref Range   Free T4 1.5 0.8 - 1.8 ng/dL  VITAMIN D 25 Hydroxy (Vit-D Deficiency, Fractures)     Status: Abnormal   Collection Time: 10/06/20 10:24 AM  Result Value Ref Range   Vit D, 25-Hydroxy 29 (L) 30 - 100 ng/mL    Comment: Vitamin D Status         25-OH Vitamin D: . Deficiency:                    <20 ng/mL Insufficiency:             20 - 29 ng/mL Optimal:                 > or = 30 ng/mL . For 25-OH Vitamin D testing on patients on  D2-supplementation and patients for whom quantitation  of D2 and D3 fractions is required, the [facility](TM) 25-OH VIT D, (D2,D3), LC/MS/MS is recommended: order  code [phone] (patients >28yrs). See Note 1 . Note 1 . For additional information, please refer to   http://education.[facility].com/faq/FAQ199  (This  link is being provided for informational/ educational purposes only.)   POCT UA - Microalbumin     Status: Normal   Collection Time: 10/11/20 11:00 AM  Result Value Ref Range   Microalbumin Ur, POC 30 mg/L   Creatinine, POC 300 mg/dL   Albumin/Creatinine Ratio, Urine, POC <30   HgB A1c     Status: Abnormal   Collection Time: 10/11/20 11:01 AM  Result Value Ref Range   Hemoglobin A1C 9.2 (A) 4.0 - 5.6 %   HbA1c POC (<> result, manual entry)     HbA1c, POC (prediabetic range)     HbA1c, POC (controlled diabetic range)       Assessment & Plan:   1) Uncontrolled type 2 diabetes mellitus with complication, with long-term current use of insulin ([place])  She presents today with her meter and logs showing persistent fasting and postprandial hyperglycemia.  Her POCT A1c today is 9.2%, increasing from last visit of 8.4%.  She reports she has not had a soda in 3 months but has been craving sweets and indulging in them.  She denies any hypoglycemia.  -Her diabetes is complicated by history of noncompliance (she recently showed better engagement) and patient remains at extremely high risk for more acute and chronic complications of diabetes which include CAD, CVA, CKD, retinopathy, and neuropathy. These are all discussed in detail with the patient.  - Nutritional counseling repeated at each appointment due to patients tendency to fall back in to old habits.  - The patient admits there is a room for improvement in their diet and drink choices. -  Suggestion is made for the patient to avoid simple carbohydrates from their diet including Cakes, Sweet Desserts / Pastries, Ice Cream, Soda (diet and regular), Sweet Tea, Candies, Chips, Cookies, Sweet Pastries, Store Bought Juices, Alcohol in Excess of 1-2 drinks a day, Artificial Sweeteners, Coffee Creamer, and "Sugar-free" Products. This will help patient to have stable blood glucose profile and  potentially avoid unintended weight gain.   - I encouraged the patient to switch to unprocessed or minimally processed complex starch and increased protein intake (animal or plant source), fruits, and vegetables.   - Patient is advised to stick to a routine mealtimes to eat 3 meals a day and avoid unnecessary snacks (to snack only to correct hypoglycemia).  - I have approached patient with the following individualized plan to manage diabetes and patient agrees.  -She will continue to require intensive treatment with  basal/bolus insulin in order for her to achieve and maintain control of diabetes to target.  -Although she has lost control recently, she would like the chance to change her diet and avoid increasing her medications at this time.  With that in mind, she is advised to continue her current regimen consisting of Lantus 80 units SQ nightly, Novolog 22-28 units SQ TID with meals if glucose above 90 and she is eating (specific instructions on how to titrate insulin based on glucose readings given to patient in writing), continue Metformin 500 mg ER po BID with meals and Glipizide 10 mg XL daily with breakfast.  She could not afford copay for Ozempic.    -She is encouraged to continue monitoring blood glucose 4 times daily, before meals and before bed, and to call the clinic if she has readings less than 70 or greater than 300 for 3 tests in a row.  2) Lipids/HPL:  Her most recent lipid panel from 12/25/19 shows controlled LDL of 65.  She is advised to  continue Simvastatin 20 mg po daily at bedtime.  Side effects and precautions discussed with her.  Will recheck lipid panel prior to next visit.  3) Hypertension:  Her blood pressure is not controlled to target based on today's reading.  She is advised to continue Lisinopril 5 mg po daily (managed by her PCP).  4) Hypothyroidism:  -Her previsit thyroid function tests are consistent with slight over-replacement.  She did complain of  intermittent palpitations. She had just gotten a refill of her Levothyroxine 75 mcg.  She is advised to take her Levothyroxine 75 mcg 6 out of 7 days of the week (skipping on Sundays).  Will recheck TFTs prior to next visit and adjust dose further if needed.   - We discussed about the correct intake of her thyroid hormone, on empty stomach at fasting, with water, separated by at least 30 minutes from breakfast and other medications,  and separated by more than 4 hours from calcium, iron, multivitamins, acid reflux medications (PPIs). -Patient is made aware of the fact that thyroid hormone replacement is needed for life, dose to be adjusted by periodic monitoring of thyroid function tests.    - I advised patient to maintain close follow up with Perlie Mayo, NP for primary care needs.    I spent 41 minutes in the care of the patient today including review of labs from [place], Lipids, Thyroid Function, Hematology (current and previous including abstractions from other facilities); face-to-face time discussing  her blood glucose readings/logs, discussing hypoglycemia and hyperglycemia episodes and symptoms, medications doses, her options of short and long term treatment based on the latest standards of care / guidelines;  discussion about incorporating lifestyle medicine;  and documenting the encounter.    Please refer to Patient Instructions for Blood Glucose Monitoring and Insulin/Medications Dosing Guide"  in media tab for additional information. Please  also refer to " Patient Self Inventory" in the Media  tab for reviewed elements of pertinent patient history.  Gillian Scarce participated in the discussions, expressed understanding, and voiced agreement with the above plans.  All questions were answered to her satisfaction. she is encouraged to contact clinic should she have any questions or concerns prior to her return visit.  Follow up plan: -Return in about 3 months (around 01/11/2021) for  Diabetes F/U with A1c in office, Thyroid follow up, Previsit labs, Bring meter and logs.  Rayetta Pigg, [facility] [address] [place], [place] [address] Phone: [phone] Fax: [phone]   10/11/2020, 11:28 AM

## 2020-10-12 ENCOUNTER — Encounter: Payer: Self-pay | Admitting: Allergy & Immunology

## 2020-10-12 ENCOUNTER — Ambulatory Visit (INDEPENDENT_AMBULATORY_CARE_PROVIDER_SITE_OTHER): Payer: Medicaid Other | Admitting: Allergy & Immunology

## 2020-10-12 ENCOUNTER — Other Ambulatory Visit: Payer: Self-pay | Admitting: Family Medicine

## 2020-10-12 VITALS — BP 138/64 | HR 64 | Temp 98.0°F | Resp 16 | Ht 65.0 in | Wt 249.8 lb

## 2020-10-12 DIAGNOSIS — J4520 Mild intermittent asthma, uncomplicated: Secondary | ICD-10-CM

## 2020-10-12 DIAGNOSIS — K219 Gastro-esophageal reflux disease without esophagitis: Secondary | ICD-10-CM | POA: Diagnosis not present

## 2020-10-12 DIAGNOSIS — J310 Chronic rhinitis: Secondary | ICD-10-CM | POA: Diagnosis not present

## 2020-10-12 DIAGNOSIS — T7800XD Anaphylactic reaction due to unspecified food, subsequent encounter: Secondary | ICD-10-CM

## 2020-10-12 MED ORDER — PANTOPRAZOLE SODIUM 40 MG PO TBEC: DELAYED_RELEASE_TABLET | ORAL | 4 refills | Status: DC

## 2020-10-12 MED ORDER — FLUTICASONE PROPIONATE 50 MCG/ACT NA SUSP
1.0000 | Freq: Every day | NASAL | 3 refills | Status: DC
Start: 2020-10-12 — End: 2023-05-24

## 2020-10-12 MED ORDER — EPINEPHRINE 0.3 MG/0.3ML IJ SOAJ: 0.3000 mg | INTRAMUSCULAR | 1 refills | Status: DC | PRN

## 2020-10-12 MED ORDER — CETIRIZINE HCL 10 MG PO TABS: 10.0000 mg | ORAL_TABLET | Freq: Every day | ORAL | 3 refills | Status: DC

## 2020-10-12 NOTE — Patient Instructions (Addendum)
1. Mild intermittent asthma, uncomplicated - Lung testing looked stable today. - Continue with albuterol 4 puffs every 4-6 hours as needed.   - There is no need for a controller medication at this time.   2. Gastroesophageal reflux disease - Continue with pantoprazole 40mg  daily.  3. 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 - EpiPen refilled today.    4. Chronic rhinitis - Start fluticasone one spray per nostril daily. - You can also use cetirizine 10mg  daily.   5. Return in about 1 year (around 10/12/2021).   Please inform us of any Emergency Department visits, hospitalizations, or changes in symptoms. Call us before going to the ED for breathing or allergy symptoms since we might be able to fit you in for a sick visit. Feel free to contact us anytime with any questions, problems, or concerns.  It was a pleasure to see you again today!  Websites that have reliable patient information: 1. American Academy of Asthma, Allergy, and Immunology: www.aaaai.org 2. Food Allergy Research and Education (FARE): foodallergy.org 3. Mothers of Asthmatics: http://www.asthmacommunitynetwork.org 4. American College of Allergy, Asthma, and Immunology: www.acaai.org   COVID-19 Vaccine Information can be found at: ShippingScam.co.uk For questions related to vaccine distribution or appointments, please email vaccine@[email or web address] or call [phone].     "Like" [place] on Facebook and Instagram for our latest updates!        Make sure you are registered to vote! If you have moved or changed any of your contact information, you will need to get this updated before voting!  In some cases, you MAY be able to register to vote online: [email or web address]

## 2020-10-12 NOTE — Progress Notes (Signed)
FOLLOW UP  Date of Service/Encounter:  10/12/20   Assessment:   Mild intermittent asthma, uncomplicated  Adverse food reaction(walnut, kiwi, seafood)   Gastroesophageal reflux disease- controlled with a pantoprazole  Chronic nonallergic rhinitis- with negativeenvironmental IgE panel in 2019   Plan/Recommendations:   1. Mild intermittent asthma, uncomplicated - Lung testing looked stable today. - Continue with albuterol 4 puffs every 4-6 hours as needed.   - There is no need for a controller medication at this time.   2. Gastroesophageal reflux disease - Continue with pantoprazole 40mg  daily.  3. 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 - EpiPen refilled today.    4. Chronic rhinitis - Start fluticasone one spray per nostril daily. - You can also use cetirizine 10mg  daily.   5. Return in about 1 year (around 10/12/2021).   Subjective:   Sandy Valencia is a 60 y.o. Valencia presenting today for follow up of  Chief Complaint  Patient presents with  . Asthma    Sandy Valencia has a history of the following: Patient Active Problem List   Diagnosis Date Noted  . Primary osteoarthritis involving multiple joints 12/02/2019  . Mild intermittent asthma without complication 92/42/6834  . Gastroesophageal reflux disease 05/21/2018  . Hypothyroidism 04/22/2017  . Mixed hyperlipidemia 12/31/2016  . Uncontrolled type 2 diabetes mellitus with complication, with long-term current use of insulin ([place]) 05/18/2015  . Essential hypertension, benign 05/18/2015  . Morbid obesity with body mass index (BMI) of 40.0 to 44.9 in adult [facility]) 05/18/2015  . Fatty liver 02/14/2015  . Hiatal hernia   . Dysphagia, pharyngoesophageal phase     History obtained from: chart review and patient.  Sandy Valencia is a 60 y.o. Valencia presenting for a follow up visit.  Sandy was last seen in November 2021.  At that time, Sandy was continued on albuterol every 4-6 hours as needed.   Her reflux was controlled with pantoprazole 40 mg daily.  Sandy was continued on epinephrine as needed for kiwi and walnut exposure.  Since last visit, Sandy has mostly done well.  Asthma/Respiratory Symptom History: Sandy remains on her albuterol as needed.  Sandy has not had any increased work of breathing.  Sandy has not had any wheezing.  Sandy has not refilled her albuterol since it expired.  Allergic Rhinitis Symptom History: Allergic rhinitis has never been much of an issue for her.  However, this year is much different.  Sandy has been having increased runny nose as well as some itchy eyes.  Sandy has been using her grandsons Flonase with improvement in her symptoms.   Food Allergy Symptom History: Sandy had an accidental epxosure ot kiwi back in February. This was within 30 minutes and Sandy used some cold compresses and Benadryl.  Sandy has otherwise avoided her triggers, including walnut.  Sandy does need a new EpiPen.  Sandy is not interested in retesting at this time.  Sandy has been having some atrial fluttering. Evidently her thyroid levels were elevated and Sandy was decreased to 67mcg of her Synthroid. Diabetes is under good control.  Sandy apparently had some had a circulation test which was good.  Otherwise, there have been no changes to her past medical history, surgical history, family history, or social history.    Review of Systems  Constitutional: Negative.  Negative for chills, fever, malaise/fatigue and weight loss.  HENT: Positive for congestion. Negative for ear discharge, ear pain and sinus pain.   Eyes: Negative for pain, discharge  and redness.  Respiratory: Negative for cough, sputum production, shortness of breath and wheezing.   Cardiovascular: Negative.  Negative for chest pain, palpitations, orthopnea and claudication.  Gastrointestinal: Negative for abdominal pain, constipation, diarrhea, heartburn, nausea and vomiting.  Skin: Negative.  Negative for itching and rash.  Neurological:  Negative for dizziness and headaches.  Endo/Heme/Allergies: Positive for environmental allergies. Does not bruise/bleed easily.       Objective:   Blood pressure 138/64, pulse 64, temperature 98 F (36.7 C), temperature source Temporal, resp. rate 16, height 5\' 5"  (1.651 m), weight 249 lb 12.8 oz (113.3 kg), SpO2 97 %. Body mass index is 41.57 kg/m.   Physical Exam:  Physical Exam Constitutional:      Appearance: Sandy is well-developed.     Comments: Very pleasant Valencia. Cooperative with the exam.  HENT:     Head: Normocephalic and atraumatic.     Right Ear: Tympanic membrane, ear canal and external ear normal.     Left Ear: Tympanic membrane, ear canal and external ear normal.     Nose: No nasal deformity, septal deviation, mucosal edema or rhinorrhea.     Right Turbinates: Enlarged and swollen.     Left Turbinates: Enlarged and swollen.     Right Sinus: No maxillary sinus tenderness or frontal sinus tenderness.     Left Sinus: No maxillary sinus tenderness or frontal sinus tenderness.     Mouth/Throat:     Lips: Pink.     Mouth: Mucous membranes are moist. Mucous membranes are not pale and not dry.     Pharynx: Uvula midline.  Eyes:     General: Lids are normal. Allergic shiner present.        Right eye: No discharge.        Left eye: No discharge.     Conjunctiva/sclera: Conjunctivae normal.     Right eye: Right conjunctiva is not injected. No chemosis.    Left eye: Left conjunctiva is not injected. No chemosis.    Pupils: Pupils are equal, round, and reactive to light.  Cardiovascular:     Rate and Rhythm: Normal rate and regular rhythm.     Heart sounds: Normal heart sounds.  Pulmonary:     Effort: Pulmonary effort is normal. No tachypnea, accessory muscle usage or respiratory distress.     Breath sounds: Normal breath sounds. No wheezing, rhonchi or rales.     Comments: Moving air well in all lung fields. No increased work of breathing noted.  Chest:     Chest  wall: No tenderness.  Lymphadenopathy:     Cervical: No cervical adenopathy.  Skin:    Coloration: Skin is not pale.     Findings: No abrasion, erythema, petechiae or rash. Rash is not papular, urticarial or vesicular.  Neurological:     Mental Status: Sandy is alert.  Psychiatric:        Behavior: Behavior is cooperative.      Diagnostic studies:    Spirometry: results normal (FEV1: 1.82/70%, FVC: 2.21/67%, FEV1/FVC: 82%).    Spirometry consistent with normal pattern.   Allergy Studies: none       Salvatore Marvel, MD  [facility] [place]

## 2020-10-15 ENCOUNTER — Other Ambulatory Visit: Payer: Self-pay | Admitting: "Endocrinology

## 2020-10-15 ENCOUNTER — Other Ambulatory Visit: Payer: Self-pay | Admitting: Allergy & Immunology

## 2020-11-09 ENCOUNTER — Other Ambulatory Visit: Payer: Self-pay | Admitting: Family Medicine

## 2020-11-09 DIAGNOSIS — IMO0002 Reserved for concepts with insufficient information to code with codable children: Secondary | ICD-10-CM

## 2020-11-14 ENCOUNTER — Other Ambulatory Visit: Payer: Self-pay | Admitting: Family Medicine

## 2020-11-14 DIAGNOSIS — IMO0002 Reserved for concepts with insufficient information to code with codable children: Secondary | ICD-10-CM

## 2020-11-18 ENCOUNTER — Encounter: Payer: Self-pay | Admitting: Family Medicine

## 2020-11-18 ENCOUNTER — Other Ambulatory Visit: Payer: Self-pay | Admitting: Internal Medicine

## 2020-11-18 ENCOUNTER — Encounter: Payer: Self-pay | Admitting: Internal Medicine

## 2020-11-18 ENCOUNTER — Other Ambulatory Visit: Payer: Self-pay

## 2020-11-18 ENCOUNTER — Telehealth: Payer: Medicaid Other | Admitting: Internal Medicine

## 2020-11-18 DIAGNOSIS — L309 Dermatitis, unspecified: Secondary | ICD-10-CM

## 2020-11-18 MED ORDER — TRIAMCINOLONE ACETONIDE 0.1 % EX CREA
1.0000 | TOPICAL_CREAM | Freq: Two times a day (BID) | CUTANEOUS | 0 refills | Status: DC
Start: 2020-11-18 — End: 2022-04-16

## 2020-11-18 NOTE — Progress Notes (Signed)
Virtual Visit via Telephone Note   This visit type was conducted due to national recommendations for restrictions regarding the COVID-19 Pandemic (e.g. social distancing) in an effort to limit this patient's exposure and mitigate transmission in our community.  Due to her co-morbid illnesses, this patient is at least at moderate risk for complications without adequate follow up.  This format is felt to be most appropriate for this patient at this time.  The patient did not have access to video technology/had technical difficulties with video requiring transitioning to audio format only (telephone).  All issues noted in this document were discussed and addressed.  No physical exam could be performed with this format.  Evaluation Performed:  Follow-up visit  Date:  11/18/2020   ID:  Sandy, Valencia Dec 25, 1960, MRN [id number]  Patient Location: Home Provider Location: Office/Clinic  Participants: Patient Location of Patient: Home Location of Provider: Telehealth Consent was obtain for visit to be over via telehealth. I verified that I am speaking with the correct person using two identifiers.  PCP:  Lindell Spar, MD   Chief Complaint:  Rash  History of Present Illness:    Sandy Valencia is a 60 y.o. female who has a televisit for c/o rash over her right arm. She has been having itching and redness over right arm for last 1 week. She has tried Cortisone and Neosporin creams with minimal relief. Denies any recent insect bite.  The patient does not have symptoms concerning for COVID-19 infection (fever, chills, cough, or new shortness of breath).   Past Medical, Surgical, Social History, Allergies, and Medications have been Reviewed.  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   Anaphylactic shock, unspecified, subsequent encounter    Anemia, iron deficiency 02/14/2015   Angio-edema     Arthritis    Asthma    Asymptomatic varicose veins of right lower extremity    Chronic obstructive pulmonary disease with (acute) exacerbation (HCC)    Chronic rhinitis 05/21/2018   COVID-19    Diabetes mellitus    Essential (primary) hypertension    Family history of colon cancer 09/03/2012   Fatty liver    Gastro-esophageal reflux disease without esophagitis    GERD (gastroesophageal reflux disease)    History of colonic polyps 08/26/2017   Hypercholesteremia    Hypertension    Hypothyroidism    Low back pain    Mass of finger, right s/p surgical excision (mucoid cyst) 04/17/18 04/22/2018   Mucous cyst of digit of right hand    Obesity, unspecified    Other adverse food reactions, not elsewhere classified, initial encounter    Personal history of noncompliance with medical treatment, presenting hazards to health 05/18/2015   Rash and other nonspecific skin eruption    Sleep apnea    pt siad, "i had small amount but could not tolerate CPAP and they said it was ok since it was mild.   Sleep apnea, unspecified    Unspecified asthma, uncomplicated    Zoster without complications    Past Surgical History:  Procedure Laterality Date   ABDOMINAL HYSTERECTOMY     CHOLECYSTECTOMY     COLONOSCOPY N/A 09/22/2012   RMR: colonic polyps -removed as described above. tubular adenoma, next TCS 09/2017   COLONOSCOPY WITH PROPOFOL N/A 09/23/2017   Procedure: COLONOSCOPY WITH PROPOFOL;  Surgeon: Daneil Dolin, MD;  [facility]: [facility];  Service: Endoscopy;  Laterality: N/A;  7:30am   CYST EXCISION Left 07/19/2016   Procedure: CYST REMOVAL LEFT RING FINGER;  Surgeon: Carole Civil, MD;  Location: [facility];  Service: Orthopedics;  Laterality: Left;   CYST REMOVAL LEG Right    foot   ESOPHAGOGASTRODUODENOSCOPY N/A 10/21/2014   RMR: Small hiatal hernia; otherwise normal EGD status post passage of a Maloney dilator.    EXCISION MASS UPPER EXTREMETIES Right 04/17/2018   Procedure: EXCISION MASS  UPPER EXTREMETIES right ring finger;  Surgeon: Carole Civil, MD;  Location: [facility];  Service: Orthopedics;  Laterality: Right;   MALONEY DILATION N/A 10/21/2014   Procedure: [name] DILATION;  Surgeon: Daneil Dolin, MD;  Location: [facility];  Service: Endoscopy;  Laterality: N/A;   MIDDLE EAR SURGERY Right    patched hole in ear drum   POLYPECTOMY  09/23/2017   Procedure: POLYPECTOMY;  Surgeon: Daneil Dolin, MD;  Location: [facility];  Service: Endoscopy;;  polyp hepatic flexure polyp cs, splenic flexure polyp cs, rectal polyp cs     Current Meds  Medication Sig   Accu-Chek FastClix Lancets MISC USE TO TEST 4 TIMES DAILY.   ACCU-CHEK GUIDE test strip USE TO TEST 4 TIMES DAILY.   aspirin EC 81 MG tablet Take 81 mg by mouth daily.   cetirizine (ZYRTEC) 10 MG tablet Take 1 tablet (10 mg total) by mouth daily.   Cholecalciferol (VITAMIN D-3) 1000 UNITS CAPS Take 1,000 Units by mouth daily.    diclofenac Sodium (VOLTAREN) 1 % GEL Apply 2 g topically 2 (two) times daily as needed.   Diclofenac Sodium 3 % GEL APPLY 2 GRAMS TO AFFECTED AREAS 2 TIMES A DAY AS NEEDED   EPINEPHrine 0.3 mg/0.3 mL IJ SOAJ injection Inject 0.3 mg into the muscle as needed for anaphylaxis.   fluticasone (FLONASE) 50 MCG/ACT nasal spray Place 1 spray into both nostrils daily.   glipiZIDE (GLUCOTROL XL) 10 MG 24 hr tablet Take 1 tablet (10 mg total) by mouth daily with breakfast.   HYDROcodone-acetaminophen (NORCO) 7.5-325 MG tablet Take by mouth.   ketoconazole (NIZORAL) 2 % cream Apply 1 application topically daily as needed for irritation.   LANTUS SOLOSTAR 100 UNIT/ML Solostar Pen INJECT 80 UNITS SUBCUTANEOUSLY INTO THE SKIN DAILY.   levothyroxine (SYNTHROID) 75 MCG tablet TAKE ONE (1) TABLET BY MOUTH EVERY DAY BEFORE BREAKFAST   lisinopril (ZESTRIL) 5 MG tablet Take 5 mg by mouth daily.   LITETOUCH PEN NEEDLES 31G X 8 MM MISC USE AS DIRECTED FOUR TIMES DAILY.   metFORMIN (GLUCOPHAGE-XR) 500 MG 24 hr  tablet TAKE ONE TABLET BY MOUTH TWICE A DAY AFTER A MEAL   NOVOLOG FLEXPEN 100 UNIT/ML FlexPen INJECT 22-28 UNITS INTO THE SKIN THREE TIMES DAILY WITH MEALS   pantoprazole (PROTONIX) 40 MG tablet TAKE ONE TABLET (40MG  TOTAL) BY MOUTH DAILY   pregabalin (LYRICA) 150 MG capsule Take 150 mg by mouth 2 (two) times daily.   PROAIR HFA 108 (90 Base) MCG/ACT inhaler INHALE 4 PUFFS INTO THE LUNGS EVERY 6 HOURS AS NEEDED FOR WHEEZING OR SHORTNESS OF BREATH   simvastatin (ZOCOR) 10 MG tablet TAKE ONE (1) TABLET BY MOUTH EVERY DAY   vitamin B-12 (CYANOCOBALAMIN) 500 MCG tablet Take 500 mcg by mouth daily.     Allergies:   Kiwi extract and Other   ROS:   Please see the history of present illness.     All other systems reviewed and are negative.  Labs/Other Tests and Data Reviewed:    Recent Labs: 07/04/2020: ALT 37; BUN 13; Creat 0.69; Potassium 4.5; Sodium 142 10/06/2020: TSH 0.62   Recent Lipid Panel Lab Results  Component Value Date/Time   CHOL 137 12/25/2019 07:09 AM   TRIG 77 12/25/2019 07:09 AM   HDL 56 12/25/2019 07:09 AM   CHOLHDL 2.4 12/25/2019 07:09 AM   LDLCALC 65 12/25/2019 07:09 AM    Wt Readings from Last 3 Encounters:  10/12/20 249 lb 12.8 oz (113.3 kg)  10/11/20 247 lb (112 kg)  07/12/20 246 lb (111.6 kg)     ASSESSMENT & PLAN:    Eczema Rash appears to be eczematous Started Kenalog Benadryl PRN for intense itching  Time:   Today, I have spent 7 minutes reviewing the chart, including problem list, medications, and with the patient with telehealth technology discussing the above problems.   Medication Adjustments/Labs and Tests Ordered: Current medicines are reviewed at length with the patient today.  Concerns regarding medicines are outlined above.   Tests Ordered: No orders of the defined types were placed in this encounter.   Medication Changes: No orders of the defined types were placed in this encounter.    Note: This dictation was prepared with  Dragon dictation along with smaller phrase technology. Similar sounding words can be transcribed inadequately or may not be corrected upon review. Any transcriptional errors that result from this process are unintentional.      Disposition:  Follow up  Signed, Lindell Spar, MD  11/18/2020 11:48 AM     [place]

## 2020-11-21 ENCOUNTER — Other Ambulatory Visit: Payer: Self-pay | Admitting: Nurse Practitioner

## 2020-11-24 ENCOUNTER — Other Ambulatory Visit (HOSPITAL_COMMUNITY): Payer: Self-pay | Admitting: Internal Medicine

## 2020-11-24 DIAGNOSIS — Z1231 Encounter for screening mammogram for malignant neoplasm of breast: Secondary | ICD-10-CM

## 2020-11-29 ENCOUNTER — Ambulatory Visit: Payer: Medicaid Other | Admitting: Internal Medicine

## 2020-11-29 DIAGNOSIS — M545 Low back pain, unspecified: Secondary | ICD-10-CM | POA: Diagnosis not present

## 2020-11-29 DIAGNOSIS — M542 Cervicalgia: Secondary | ICD-10-CM | POA: Diagnosis not present

## 2020-11-29 DIAGNOSIS — M79606 Pain in leg, unspecified: Secondary | ICD-10-CM | POA: Diagnosis not present

## 2020-11-29 DIAGNOSIS — I10 Essential (primary) hypertension: Secondary | ICD-10-CM | POA: Diagnosis not present

## 2020-11-29 DIAGNOSIS — Z79891 Long term (current) use of opiate analgesic: Secondary | ICD-10-CM | POA: Diagnosis not present

## 2020-12-07 ENCOUNTER — Other Ambulatory Visit: Payer: Self-pay

## 2020-12-07 ENCOUNTER — Ambulatory Visit: Payer: Medicaid Other | Admitting: Internal Medicine

## 2020-12-07 ENCOUNTER — Ambulatory Visit: Payer: Medicaid Other | Admitting: Family Medicine

## 2020-12-07 ENCOUNTER — Encounter: Payer: Self-pay | Admitting: Family Medicine

## 2020-12-07 VITALS — BP 163/81 | HR 72 | Temp 98.5°F | Resp 20 | Ht 65.0 in | Wt 244.0 lb

## 2020-12-07 DIAGNOSIS — K219 Gastro-esophageal reflux disease without esophagitis: Secondary | ICD-10-CM

## 2020-12-07 DIAGNOSIS — E118 Type 2 diabetes mellitus with unspecified complications: Secondary | ICD-10-CM

## 2020-12-07 DIAGNOSIS — R5383 Other fatigue: Secondary | ICD-10-CM

## 2020-12-07 DIAGNOSIS — Z6841 Body Mass Index (BMI) 40.0 and over, adult: Secondary | ICD-10-CM | POA: Diagnosis not present

## 2020-12-07 DIAGNOSIS — E1165 Type 2 diabetes mellitus with hyperglycemia: Secondary | ICD-10-CM | POA: Diagnosis not present

## 2020-12-07 DIAGNOSIS — Z794 Long term (current) use of insulin: Secondary | ICD-10-CM | POA: Diagnosis not present

## 2020-12-07 DIAGNOSIS — IMO0002 Reserved for concepts with insufficient information to code with codable children: Secondary | ICD-10-CM

## 2020-12-07 DIAGNOSIS — R8290 Unspecified abnormal findings in urine: Secondary | ICD-10-CM

## 2020-12-07 DIAGNOSIS — I10 Essential (primary) hypertension: Secondary | ICD-10-CM | POA: Diagnosis not present

## 2020-12-07 DIAGNOSIS — R7989 Other specified abnormal findings of blood chemistry: Secondary | ICD-10-CM

## 2020-12-07 LAB — POCT URINALYSIS DIPSTICK
Bilirubin, UA: NEGATIVE
Blood, UA: NEGATIVE
Glucose, UA: NEGATIVE
Ketones, UA: NEGATIVE
Nitrite, UA: NEGATIVE
Protein, UA: NEGATIVE
Spec Grav, UA: 1.025 (ref 1.010–1.025)
Urobilinogen, UA: 0.2 E.U./dL
pH, UA: 5 (ref 5.0–8.0)

## 2020-12-07 MED ORDER — SULFAMETHOXAZOLE-TRIMETHOPRIM 800-160 MG PO TABS: 1.0000 | ORAL_TABLET | Freq: Two times a day (BID) | ORAL | 0 refills | Status: AC

## 2020-12-07 NOTE — Progress Notes (Signed)
Sandy Valencia     MRN: [id number]      DOB: 19-Jun-1960   HPI Sandy Valencia is here c/o urinary frequency and malodorous urine C/o polyuria, polydipsia, fatigue, FBG 219  ROS Denies recent fever or chills. Denies sinus pressure, nasal congestion, ear pain or sore throat. Denies chest congestion, productive cough or wheezing. Denies chest pains, palpitations and leg swelling Denies abdominal pain, , vomiting,diarrhea or constipation.    Denies uncontrolled  joint pain, swelling and limitation in mobility. Denies headaches, seizures, numbness, or tingling. Denies depression, anxiety or insomnia. Denies skin break down or rash.   PE  BP (!) 163/81 (BP Location: Right Arm, Patient Position: Sitting, Cuff Size: Large)   Pulse 72   Temp 98.5 F (36.9 C)   Resp 20   Ht 5\' 5"  (1.651 m)   Wt 244 lb (110.7 kg)   SpO2 95%   BMI 40.60 kg/m   Patient alert and oriented  HEENT: No facial asymmetry, EOMI,     Neck supple .  Chest: Clear to auscultation bilaterally.  CVS: RRR.  ABD: Soft non tender.   Ext: No edema  MS: Decreased  ROM spine, shoulders, hips and knees.  Skin: Intact, no ulcerations or rash noted.  Psych: Good eye contact, normal affect. Memory intact not anxious or depressed appearing.  CNS: CN 2-12 intact, power,  normal throughout.no focal deficits noted.   Assessment & Plan  Malodorous urine Abn CCUA treat presumptively and f/u C/S  Essential hypertension Elevated today, missed meds, re eval I n several weeks DASH diet and commitment to daily physical activity for a minimum of 30 minutes discussed and encouraged, as a part of hypertension management. The importance of attaining a healthy weight is also discussed.  BP/Weight 12/07/2020 10/12/2020 10/11/2020 07/12/2020 04/07/2020 04/06/2020 82/02/9370  Systolic BP 696 789 381 017 510 258 527  Diastolic BP 81 64 68 88 78 92 76  Wt. (Lbs) 244 249.8 247 246 249 - 250  BMI 40.6 41.57 41.1 40.94 41.44 - 41.6        Morbid obesity with body mass index (BMI) of 40.0 to 44.9 in adult [facility])  Patient re-educated about  the importance of commitment to a  minimum of 150 minutes of exercise per week as able.  The importance of healthy food choices with portion control discussed, as well as eating regularly and within a 12 hour window most days. The need to choose "clean , green" food 50 to 75% of the time is discussed, as well as to make water the primary drink and set a goal of 64 ounces water daily.    Weight /BMI 12/07/2020 10/12/2020 10/11/2020  WEIGHT 244 lb 249 lb 12.8 oz 247 lb  HEIGHT 5\' 5"  5\' 5"  5\' 5"   BMI 40.6 kg/m2 41.57 kg/m2 41.1 kg/m2      Uncontrolled type 2 diabetes mellitus with complication, with long-term current use of insulin ([place]) Managed by endo and uncontrolled, needs to keep f/u Sandy Valencia is reminded of the importance of commitment to daily physical activity for 30 minutes or more, as able and the need to limit carbohydrate intake to 30 to 60 grams per meal to help with blood sugar control.   The need to take medication as prescribed, test blood sugar as directed, and to call between visits if there is a concern that blood sugar is uncontrolled is also discussed.   Sandy Valencia is reminded of the importance of daily foot exam, annual eye  examination, and good blood sugar, blood pressure and cholesterol control.  Diabetic Labs Latest Ref Rng & Units 12/07/2020 10/11/2020 07/12/2020 07/04/2020 04/05/2020  HbA1c 4.0 - 5.6 % - 9.2(A) 8.4(A) - 8.9(A)  Microalbumin mg/L - 30 - - -  Micro/Creat Ratio - - <30 - - -  Chol <200 mg/dL - - - - -  HDL > OR = 50 mg/dL - - - - -  Calc LDL mg/dL (calc) - - - - -  Triglycerides <150 mg/dL - - - - -  Creatinine 0.57 - 1.00 mg/dL 0.78 - - 0.69 -   BP/Weight 12/07/2020 10/12/2020 10/11/2020 07/12/2020 04/07/2020 04/06/2020 17/0/0174  Systolic BP 944 967 591 [phone] 357  Diastolic BP 81 64 68 88 78 92 76  Wt. (Lbs) 244 249.8 247 246 249 - 250  BMI  40.6 41.57 41.1 40.94 41.44 - 41.6   No flowsheet data found.      Elevated LFTs Marked increase in enzymes, will advise her to hold zocor, has f/u wit PCP in 2 weeks  Fatigue Most ikely cause is uncontrolled diabetes, will check CBC

## 2020-12-07 NOTE — Patient Instructions (Addendum)
Keep f/u with Dr Posey Pronto re eval BP as before  You are treated for presumed UTI, we will call when final results are available  BP slightly high today, however  you did not take your medication  It is important that you exercise regularly at least 30 minutes 5 times a week. If you develop chest pain, have severe difficulty breathing, or feel very tired, stop exercising immediately and seek medical attention    Think about what you will eat, plan ahead. Choose " clean, green, fresh or frozen" over canned, processed or packaged foods which are more sugary, salty and fatty. 70 to 75% of food eaten should be vegetables and fruit. Three meals at set times with snacks allowed between meals, but they must be fruit or vegetables. Aim to eat over a 12 hour period , example 7 am to 7 pm, and STOP after  your last meal of the day. Drink water,generally about 64 ounces per day, no other drink is as healthy. Fruit juice is best enjoyed in a healthy way, by EATING the fruit.   CBC and diff, chem 7 and EGFR today

## 2020-12-08 LAB — CMP14+EGFR
ALT: 66 IU/L — ABNORMAL HIGH (ref 0–32)
AST: 50 IU/L — ABNORMAL HIGH (ref 0–40)
Albumin/Globulin Ratio: 1.5 (ref 1.2–2.2)
Albumin: 4.3 g/dL (ref 3.8–4.9)
Alkaline Phosphatase: 104 IU/L (ref 44–121)
BUN/Creatinine Ratio: 13 (ref 9–23)
BUN: 10 mg/dL (ref 6–24)
Bilirubin Total: 0.4 mg/dL (ref 0.0–1.2)
CO2: 24 mmol/L (ref 20–29)
Calcium: 9.7 mg/dL (ref 8.7–10.2)
Chloride: 100 mmol/L (ref 96–106)
Creatinine, Ser: 0.78 mg/dL (ref 0.57–1.00)
Globulin, Total: 2.8 g/dL (ref 1.5–4.5)
Glucose: 200 mg/dL — ABNORMAL HIGH (ref 65–99)
Potassium: 4.6 mmol/L (ref 3.5–5.2)
Sodium: 140 mmol/L (ref 134–144)
Total Protein: 7.1 g/dL (ref 6.0–8.5)
eGFR: 87 mL/min/{1.73_m2} (ref >59)

## 2020-12-08 LAB — CBC WITH DIFFERENTIAL/PLATELET
Basophils Absolute: 0 10*3/uL (ref 0.0–0.2)
Basos: 1 %
EOS (ABSOLUTE): 0.1 10*3/uL (ref 0.0–0.4)
Eos: 2 %
Hematocrit: 39.8 % (ref 34.0–46.6)
Hemoglobin: 12.9 g/dL (ref 11.1–15.9)
Immature Grans (Abs): 0 10*3/uL (ref 0.0–0.1)
Immature Granulocytes: 0 %
Lymphocytes Absolute: 1.7 10*3/uL (ref 0.7–3.1)
Lymphs: 36 %
MCH: 27.6 pg (ref 26.6–33.0)
MCHC: 32.4 g/dL (ref 31.5–35.7)
MCV: 85 fL (ref 79–97)
Monocytes Absolute: 0.3 10*3/uL (ref 0.1–0.9)
Monocytes: 6 %
Neutrophils Absolute: 2.6 10*3/uL (ref 1.4–7.0)
Neutrophils: 55 %
Platelets: 236 10*3/uL (ref 150–450)
RBC: 4.68 x10E6/uL (ref 3.77–5.28)
RDW: 13.7 % (ref 11.7–15.4)
WBC: 4.8 10*3/uL (ref 3.4–10.8)

## 2020-12-09 ENCOUNTER — Other Ambulatory Visit: Payer: Self-pay | Admitting: Nurse Practitioner

## 2020-12-09 ENCOUNTER — Other Ambulatory Visit: Payer: Self-pay | Admitting: Family Medicine

## 2020-12-09 DIAGNOSIS — E1165 Type 2 diabetes mellitus with hyperglycemia: Secondary | ICD-10-CM

## 2020-12-09 DIAGNOSIS — IMO0002 Reserved for concepts with insufficient information to code with codable children: Secondary | ICD-10-CM

## 2020-12-09 LAB — URINE CULTURE

## 2020-12-11 ENCOUNTER — Telehealth: Payer: Self-pay | Admitting: Family Medicine

## 2020-12-11 DIAGNOSIS — R5383 Other fatigue: Secondary | ICD-10-CM | POA: Insufficient documentation

## 2020-12-11 DIAGNOSIS — R7989 Other specified abnormal findings of blood chemistry: Secondary | ICD-10-CM | POA: Insufficient documentation

## 2020-12-11 NOTE — Assessment & Plan Note (Signed)
  Patient re-educated about  the importance of commitment to a  minimum of 150 minutes of exercise per week as able.  The importance of healthy food choices with portion control discussed, as well as eating regularly and within a 12 hour window most days. The need to choose "clean , green" food 50 to 75% of the time is discussed, as well as to make water the primary drink and set a goal of 64 ounces water daily.    Weight /BMI 12/07/2020 10/12/2020 10/11/2020  WEIGHT 244 lb 249 lb 12.8 oz 247 lb  HEIGHT 5\' 5"  5\' 5"  5\' 5"   BMI 40.6 kg/m2 41.57 kg/m2 41.1 kg/m2

## 2020-12-11 NOTE — Telephone Encounter (Signed)
Please advise her that on review of blood work, her liver enzymes have increased since January, so  She needs to hold the zocor until She returns in August to see Dr Posey Pronto

## 2020-12-11 NOTE — Assessment & Plan Note (Signed)
Abn CCUA treat presumptively and f/u C/S

## 2020-12-11 NOTE — Assessment & Plan Note (Signed)
Marked increase in enzymes, will advise her to hold zocor, has f/u wit PCP in 2 weeks

## 2020-12-11 NOTE — Assessment & Plan Note (Signed)
Most ikely cause is uncontrolled diabetes, will check CBC

## 2020-12-11 NOTE — Assessment & Plan Note (Signed)
Managed by endo and uncontrolled, needs to keep f/u Sandy Valencia is reminded of the importance of commitment to daily physical activity for 30 minutes or more, as able and the need to limit carbohydrate intake to 30 to 60 grams per meal to help with blood sugar control.   The need to take medication as prescribed, test blood sugar as directed, and to call between visits if there is a concern that blood sugar is uncontrolled is also discussed.   Sandy Valencia is reminded of the importance of daily foot exam, annual eye examination, and good blood sugar, blood pressure and cholesterol control.  Diabetic Labs Latest Ref Rng & Units 12/07/2020 10/11/2020 07/12/2020 07/04/2020 04/05/2020  HbA1c 4.0 - 5.6 % - 9.2(A) 8.4(A) - 8.9(A)  Microalbumin mg/L - 30 - - -  Micro/Creat Ratio - - <30 - - -  Chol <200 mg/dL - - - - -  HDL > OR = 50 mg/dL - - - - -  Calc LDL mg/dL (calc) - - - - -  Triglycerides <150 mg/dL - - - - -  Creatinine 0.57 - 1.00 mg/dL 0.78 - - 0.69 -   BP/Weight 12/07/2020 10/12/2020 10/11/2020 07/12/2020 04/07/2020 04/06/2020 82/02/9370  Systolic BP 696 789 381 017 510 258 527  Diastolic BP 81 64 68 88 78 92 76  Wt. (Lbs) 244 249.8 247 246 249 - 250  BMI 40.6 41.57 41.1 40.94 41.44 - 41.6   No flowsheet data found.

## 2020-12-11 NOTE — Assessment & Plan Note (Signed)
Elevated today, missed meds, re eval I n several weeks DASH diet and commitment to daily physical activity for a minimum of 30 minutes discussed and encouraged, as a part of hypertension management. The importance of attaining a healthy weight is also discussed.  BP/Weight 12/07/2020 10/12/2020 10/11/2020 07/12/2020 04/07/2020 04/06/2020 47/07/719  Systolic BP 828 833 744 [phone]  Diastolic BP 81 64 68 88 78 92 76  Wt. (Lbs) 244 249.8 247 246 249 - 250  BMI 40.6 41.57 41.1 40.94 41.44 - 41.6

## 2020-12-12 NOTE — Telephone Encounter (Signed)
Called pt-no answer- vm has not been set up

## 2020-12-16 NOTE — Telephone Encounter (Signed)
Pt informed

## 2020-12-20 ENCOUNTER — Other Ambulatory Visit: Payer: Self-pay | Admitting: "Endocrinology

## 2020-12-20 ENCOUNTER — Other Ambulatory Visit: Payer: Self-pay | Admitting: Nurse Practitioner

## 2021-01-02 ENCOUNTER — Ambulatory Visit: Payer: Medicaid Other | Admitting: Internal Medicine

## 2021-01-02 ENCOUNTER — Encounter: Payer: Self-pay | Admitting: Internal Medicine

## 2021-01-02 ENCOUNTER — Other Ambulatory Visit: Payer: Self-pay

## 2021-01-02 VITALS — BP 170/90 | HR 84 | Temp 97.9°F | Resp 18 | Ht 65.0 in | Wt 248.8 lb

## 2021-01-02 DIAGNOSIS — E782 Mixed hyperlipidemia: Secondary | ICD-10-CM

## 2021-01-02 DIAGNOSIS — E118 Type 2 diabetes mellitus with unspecified complications: Secondary | ICD-10-CM | POA: Diagnosis not present

## 2021-01-02 DIAGNOSIS — IMO0002 Reserved for concepts with insufficient information to code with codable children: Secondary | ICD-10-CM

## 2021-01-02 DIAGNOSIS — I10 Essential (primary) hypertension: Secondary | ICD-10-CM

## 2021-01-02 DIAGNOSIS — E1165 Type 2 diabetes mellitus with hyperglycemia: Secondary | ICD-10-CM | POA: Diagnosis not present

## 2021-01-02 DIAGNOSIS — K760 Fatty (change of) liver, not elsewhere classified: Secondary | ICD-10-CM | POA: Diagnosis not present

## 2021-01-02 DIAGNOSIS — Z794 Long term (current) use of insulin: Secondary | ICD-10-CM

## 2021-01-02 MED ORDER — LISINOPRIL 10 MG PO TABS: 10.0000 mg | ORAL_TABLET | Freq: Every day | ORAL | 0 refills | Status: DC

## 2021-01-02 NOTE — Assessment & Plan Note (Addendum)
BP Readings from Last 1 Encounters:  01/02/21 (!) 170/90   uncontrolled with Lisinopril 5 mg QD, increased to 10 mg QD today Counseled for compliance with the medications Advised DASH diet and moderate exercise/walking, at least 150 mins/week

## 2021-01-02 NOTE — Assessment & Plan Note (Signed)
Lab Results  Component Value Date   HGBA1C 9.2 (A) 10/11/2020   Follows up with Endocrinology

## 2021-01-02 NOTE — Assessment & Plan Note (Addendum)
H/o NAFLD Elevated liver enzymes, held statin for now until next CMP Avoid alcoholic beverages Weight loss Avoid hepatotoxic agents

## 2021-01-02 NOTE — Progress Notes (Signed)
Established Patient Office Visit  Subjective:  Patient ID: Sandy Valencia, female    DOB: 1960-07-08  Age: 60 y.o. MRN: [id number]  CC:  Chief Complaint  Patient presents with   Follow-up    Follow up would like to discuss why liver enzymes were elevated     HPI Sandy Valencia is a 60 year old female with PMH of HTN, DM, hypothyroidism, NAFLD, GERD, chronic pain syndrome and morbid obesity who presents for follow up of her chronic medical conditions.  HTN: Her BP was elevated in the office today. She states that she just took her Lisinopril. She denies any headache, dizziness, chest pain, dyspnea or palpitations. She believes that her BP would come down if she waits for few mins after taking medication.  DM: Follows by Endocrinology. Denies any polyuria or polyphagia.  NAFLD: Her liver enzymes were elevated recently and she was told to hold statin for now. She is willing for weight loss.   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   Anaphylactic shock, unspecified, subsequent encounter    Anemia, iron deficiency 02/14/2015   Angio-edema    Arthritis    Asthma    Asymptomatic varicose veins of right lower extremity    Chronic obstructive pulmonary disease with (acute) exacerbation (HCC)    Chronic rhinitis 05/21/2018   COVID-19    Diabetes mellitus    Essential (primary) hypertension    Family history of colon cancer 09/03/2012   Fatty liver    Gastro-esophageal reflux disease without esophagitis    GERD (gastroesophageal reflux disease)    History of colonic polyps 08/26/2017   Hypercholesteremia    Hypertension    Hypothyroidism    Low back pain    Mass of finger, right s/p surgical excision (mucoid cyst) 04/17/18 04/22/2018   Mucous cyst of digit of right hand    Obesity, unspecified    Other adverse food reactions, not elsewhere classified, initial encounter    Personal history  of noncompliance with medical treatment, presenting hazards to health 05/18/2015   Rash and other nonspecific skin eruption    Sleep apnea    pt siad, "i had small amount but could not tolerate CPAP and they said it was ok since it was mild.   Sleep apnea, unspecified    Unspecified asthma, uncomplicated    Zoster without complications     Past Surgical History:  Procedure Laterality Date   ABDOMINAL HYSTERECTOMY     CHOLECYSTECTOMY     COLONOSCOPY N/A 09/22/2012   RMR: colonic polyps -removed as described above. tubular adenoma, next TCS 09/2017   COLONOSCOPY WITH PROPOFOL N/A 09/23/2017   Procedure: COLONOSCOPY WITH PROPOFOL;  Surgeon: Daneil Dolin, MD;  Location: [facility];  Service: Endoscopy;  Laterality: N/A;  7:30am   CYST EXCISION Left 07/19/2016   Procedure: CYST REMOVAL LEFT RING FINGER;  Surgeon: Carole Civil, MD;  Location: [facility];  Service: Orthopedics;  Laterality: Left;   CYST REMOVAL LEG Right    foot   ESOPHAGOGASTRODUODENOSCOPY N/A 10/21/2014   RMR: Small hiatal hernia; otherwise normal EGD status post passage of a Maloney dilator.    EXCISION MASS UPPER EXTREMETIES Right 04/17/2018   Procedure: EXCISION MASS UPPER EXTREMETIES right ring finger;  Surgeon: Carole Civil, MD;  Location: [facility];  Service: Orthopedics;  Laterality: Right;   MALONEY DILATION N/A 10/21/2014   Procedure: MALONEY DILATION;  Surgeon: Daneil Dolin, MD;  Location: [facility];  Service: Endoscopy;  Laterality: N/A;   MIDDLE EAR SURGERY Right    patched hole in ear drum   POLYPECTOMY  09/23/2017   Procedure: POLYPECTOMY;  Surgeon: Daneil Dolin, MD;  Location: [facility];  Service: Endoscopy;;  polyp hepatic flexure polyp cs, splenic flexure polyp cs, rectal polyp cs    Family History  Problem Relation Age of Onset   Colon cancer Mother        diagnosed age 54, underwent surgical resection, metastatic disease several years later   Diabetes Mother    Heart attack Father     Diabetes Sister    Hypothyroidism Brother    Diabetes Sister    Diabetes Sister    Diabetes Sister    Hypothyroidism Sister    Allergic rhinitis Neg Hx    Angioedema Neg Hx    Asthma Neg Hx    Atopy Neg Hx    Eczema Neg Hx    Immunodeficiency Neg Hx    Urticaria Neg Hx     Social History   Socioeconomic History   Marital status: Divorced    Spouse name: Not on file   Number of children: 2   Years of education: Not on file   Highest education level: Not on file  Occupational History   Not on file  Tobacco Use   Smoking status: Never   Smokeless tobacco: Never  Vaping Use   Vaping Use: Never used  Substance and Sexual Activity   Alcohol use: No    Alcohol/week: 0.0 standard drinks   Drug use: No   Sexual activity: Yes    Birth control/protection: Surgical  Other Topics Concern   Not on file  Social History Narrative   Lives with grandson (since born)      Enjoys: vacation, travel, concerts      Diet: eats all food groups    Caffeine: diet Pepsi daily    Water:  8 cups daily       Wears seat belt    Does use phone   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 102 each 0   ACCU-CHEK GUIDE test strip USE TO TEST 4 TIMES DAILY. 100 strip 0   aspirin EC 81 MG tablet Take 81 mg by mouth daily.     cetirizine (ZYRTEC) 10 MG tablet Take 1 tablet (10 mg total) by mouth daily. 30 tablet 3   Cholecalciferol (VITAMIN D-3) 1000 UNITS CAPS Take 1,000 Units by mouth daily.      diclofenac Sodium (VOLTAREN) 1 % GEL Apply 2 g topically 2 (two) times daily as needed. 50 g 0    Diclofenac Sodium 3 % GEL APPLY 2 GRAMS TO AFFECTED AREAS 2 TIMES A DAY AS NEEDED 100 g 0   EPINEPHrine 0.3 mg/0.3 mL IJ SOAJ injection Inject 0.3 mg into the muscle as needed for anaphylaxis. 2 each 1   fluticasone (FLONASE) 50 MCG/ACT nasal spray Place 1 spray into both nostrils daily. 16 g  3   glipiZIDE (GLUCOTROL XL) 10 MG 24 hr tablet Take 1 tablet (10 mg total) by mouth daily with breakfast. 90 tablet 3   HYDROcodone-acetaminophen (NORCO) 7.5-325 MG tablet Take by mouth.     ketoconazole (NIZORAL) 2 % cream Apply 1 application topically daily as needed for irritation. 15 g 0   LANTUS SOLOSTAR 100 UNIT/ML Solostar Pen INJECT 80 UNITS SUBCUTANEOUSLY INTO THE SKIN DAILY. 30 mL 2   levothyroxine (SYNTHROID) 75 MCG tablet Take 1 tablet by mouth six days of the week before breakfast 90 tablet 0   LITETOUCH PEN NEEDLES 31G X 8 MM MISC USE AS DIRECTED FOUR TIMES DAILY. 100 each 0   metFORMIN (GLUCOPHAGE-XR) 500 MG 24 hr tablet TAKE ONE TABLET BY MOUTH TWICE A DAY AFTER A MEAL 180 tablet 0   NOVOLOG FLEXPEN 100 UNIT/ML FlexPen INJECT 22-28 UNITS INTO THE SKIN THREE TIMES DAILY WITH MEALS 45 mL 2   pantoprazole (PROTONIX) 40 MG tablet TAKE ONE TABLET (40MG TOTAL) BY MOUTH DAILY 30 tablet 4   pregabalin (LYRICA) 150 MG capsule Take 150 mg by mouth 2 (two) times daily.     PROAIR HFA 108 (90 Base) MCG/ACT inhaler INHALE 4 PUFFS INTO THE LUNGS EVERY 6 HOURS AS NEEDED FOR WHEEZING OR SHORTNESS OF BREATH 8.5 g 1   simvastatin (ZOCOR) 10 MG tablet TAKE ONE (1) TABLET BY MOUTH EVERY DAY 90 tablet 0   triamcinolone cream (KENALOG) 0.1 % Apply 1 application topically 2 (two) times daily. 30 g 0   vitamin B-12 (CYANOCOBALAMIN) 500 MCG tablet Take 500 mcg by mouth daily.     lisinopril (ZESTRIL) 5 MG tablet Take 5 mg by mouth daily.     No facility-administered medications prior to visit.    Allergies  Allergen Reactions   Kiwi Extract Anaphylaxis, Swelling and Palpitations   Other Anaphylaxis     Walnuts (avoids all tree nuts)    ROS Review of Systems  Constitutional:  Negative for chills and fever.  HENT:  Negative for congestion, sinus pressure, sinus pain and sore throat.   Eyes:  Negative for pain and discharge.  Respiratory:  Negative for cough and shortness of breath.   Cardiovascular:  Negative for chest pain and palpitations.  Gastrointestinal:  Negative for abdominal pain, constipation, diarrhea, nausea and vomiting.  Endocrine: Negative for polydipsia and polyuria.  Genitourinary:  Negative for dysuria and hematuria.  Musculoskeletal:  Positive for arthralgias and back pain. Negative for neck pain and neck stiffness.  Skin:  Negative for rash.  Neurological:  Negative for dizziness and weakness.  Psychiatric/Behavioral:  Negative for agitation and behavioral problems.      Objective:    Physical Exam Vitals reviewed.  Constitutional:      General: She is not in acute distress.    Appearance: She is obese. She is not diaphoretic.  HENT:     Head: Normocephalic and atraumatic.     Nose: Nose normal. No congestion.     Mouth/Throat:     Mouth: Mucous membranes are moist.     Pharynx: No posterior oropharyngeal erythema.  Eyes:     General: No scleral icterus.    Extraocular Movements: Extraocular movements intact.  Cardiovascular:     Rate and Rhythm: Normal rate and regular rhythm.     Pulses: Normal pulses.     Heart sounds: Normal heart sounds. No murmur heard. Pulmonary:     Breath sounds: Normal breath sounds. No wheezing or rales.  Abdominal:  Palpations: Abdomen is soft.     Tenderness: There is no abdominal tenderness.  Musculoskeletal:     Cervical back: Neck supple. No tenderness.     Right lower leg: No edema.     Left lower leg: No edema.  Skin:    General: Skin is warm.     Findings: No rash.  Neurological:     General: No focal deficit present.     Mental Status: She is alert and oriented to person, place, and time.  Psychiatric:         Mood and Affect: Mood normal.        Behavior: Behavior normal.    BP (!) 170/90 (BP Location: Left Arm, Patient Position: Sitting, Cuff Size: Normal)   Pulse 84   Temp 97.9 F (36.6 C) (Oral)   Resp 18   Ht _0  (1.651 m)   Wt 248 lb 12.8 oz (112.9 kg)   SpO2 97%   BMI 41.40 kg/m  Wt Readings from Last 3 Encounters:  01/02/21 248 lb 12.8 oz (112.9 kg)  12/07/20 244 lb (110.7 kg)  10/12/20 249 lb 12.8 oz (113.3 kg)     Health Maintenance Due  Topic Date Due   PNEUMOCOCCAL POLYSACCHARIDE VACCINE AGE 49-64 HIGH RISK  Never done   OPHTHALMOLOGY EXAM  Never done   HIV Screening  Never done   Hepatitis C Screening  Never done   TETANUS/TDAP  Never done   PAP SMEAR-Modifier  Never done   Zoster Vaccines- Shingrix (1 of 2) Never done   COVID-19 Vaccine (3 - Booster for Moderna series) 04/20/2020   FOOT EXAM  09/08/2020   INFLUENZA VACCINE  01/02/2021    There are no preventive care reminders to display for this patient.  Lab Results  Component Value Date   TSH 0.62 10/06/2020   Lab Results  Component Value Date   WBC 4.8 12/07/2020   HGB 12.9 12/07/2020   HCT 39.8 12/07/2020   MCV 85 12/07/2020   PLT 236 12/07/2020   Lab Results  Component Value Date   NA 140 12/07/2020   K 4.6 12/07/2020   CO2 24 12/07/2020   GLUCOSE 200 (H) 12/07/2020   BUN 10 12/07/2020   CREATININE 0.78 12/07/2020   BILITOT 0.4 12/07/2020   ALKPHOS 104 12/07/2020   AST 50 (H) 12/07/2020   ALT 66 (H) 12/07/2020   PROT 7.1 12/07/2020   ALBUMIN 4.3 12/07/2020   CALCIUM 9.7 12/07/2020   ANIONGAP 11 04/22/2018   EGFR 87 12/07/2020   Lab Results  Component Value Date   CHOL 137 12/25/2019   Lab Results  Component Value Date   HDL 56 12/25/2019   Lab Results  Component Value Date   LDLCALC 65 12/25/2019   Lab Results  Component Value Date   TRIG 77 12/25/2019   Lab Results  Component Value Date   CHOLHDL 2.4 12/25/2019   Lab Results  Component Value Date   HGBA1C  9.2 (A) 10/11/2020      Assessment & Plan:   Problem List Items Addressed This Visit       Cardiovascular and Mediastinum   Essential hypertension    BP Readings from Last 1 Encounters:  01/02/21 (!) 170/90  uncontrolled with Lisinopril 5 mg QD, increased to 10 mg QD today Counseled for compliance with the medications Advised DASH diet and moderate exercise/walking, at least 150 mins/week       Relevant Medications   lisinopril (ZESTRIL) 10 MG tablet  Digestive   Fatty liver - Primary    H/o NAFLD Elevated liver enzymes, held statin for now until next CMP Avoid alcoholic beverages Weight loss Avoid hepatotoxic agents         Endocrine   Uncontrolled type 2 diabetes mellitus with complication, with long-term current use of insulin (HCC)    Lab Results  Component Value Date   HGBA1C 9.2 (A) 10/11/2020  Follows up with Endocrinology      Relevant Medications   lisinopril (ZESTRIL) 10 MG tablet     Other   Mixed hyperlipidemia    Last lipid profile reviewed Was on statin due to DM, held statin for now due to elevated liver enzymes       Relevant Medications   lisinopril (ZESTRIL) 10 MG tablet    Meds ordered this encounter  Medications   lisinopril (ZESTRIL) 10 MG tablet    Sig: Take 1 tablet (10 mg total) by mouth daily.    Dispense:  60 tablet    Refill:  0    Follow-up: Return in about 6 weeks (around 02/13/2021) for HTN.    Lindell Spar, MD

## 2021-01-02 NOTE — Assessment & Plan Note (Signed)
Last lipid profile reviewed Was on statin due to DM, held statin for now due to elevated liver enzymes

## 2021-01-02 NOTE — Patient Instructions (Signed)
Please start taking Lisinopril 10 mg instead of 5 mg.  Please follow low carb and low salt diet and ambulate as tolerated.

## 2021-01-05 DIAGNOSIS — L03031 Cellulitis of right toe: Secondary | ICD-10-CM | POA: Diagnosis not present

## 2021-01-05 DIAGNOSIS — B351 Tinea unguium: Secondary | ICD-10-CM | POA: Diagnosis not present

## 2021-01-05 DIAGNOSIS — L03032 Cellulitis of left toe: Secondary | ICD-10-CM | POA: Diagnosis not present

## 2021-01-09 ENCOUNTER — Other Ambulatory Visit: Payer: Self-pay

## 2021-01-09 ENCOUNTER — Other Ambulatory Visit: Payer: Self-pay | Admitting: Family Medicine

## 2021-01-09 ENCOUNTER — Ambulatory Visit (HOSPITAL_COMMUNITY)
Admission: RE | Admit: 2021-01-09 | Discharge: 2021-01-09 | Disposition: A | Discharge status: Home / Self Care | Payer: Medicaid Other | Source: Ambulatory Visit | Attending: Internal Medicine | Admitting: Internal Medicine

## 2021-01-09 ENCOUNTER — Other Ambulatory Visit: Payer: Self-pay | Admitting: Nurse Practitioner

## 2021-01-09 DIAGNOSIS — Z1231 Encounter for screening mammogram for malignant neoplasm of breast: Secondary | ICD-10-CM | POA: Diagnosis not present

## 2021-01-09 DIAGNOSIS — E118 Type 2 diabetes mellitus with unspecified complications: Secondary | ICD-10-CM | POA: Diagnosis not present

## 2021-01-09 DIAGNOSIS — E1165 Type 2 diabetes mellitus with hyperglycemia: Secondary | ICD-10-CM

## 2021-01-09 DIAGNOSIS — IMO0002 Reserved for concepts with insufficient information to code with codable children: Secondary | ICD-10-CM

## 2021-01-09 DIAGNOSIS — Z794 Long term (current) use of insulin: Secondary | ICD-10-CM | POA: Diagnosis not present

## 2021-01-09 DIAGNOSIS — E039 Hypothyroidism, unspecified: Secondary | ICD-10-CM | POA: Diagnosis not present

## 2021-01-10 LAB — LIPID PANEL
Chol/HDL Ratio: 3 ratio (ref 0.0–4.4)
Cholesterol, Total: 151 mg/dL (ref 100–199)
HDL: 50 mg/dL (ref >39)
LDL Chol Calc (NIH): 84 mg/dL (ref 0–99)
Triglycerides: 89 mg/dL (ref 0–149)
VLDL Cholesterol Cal: 17 mg/dL (ref 5–40)

## 2021-01-10 LAB — COMPREHENSIVE METABOLIC PANEL
ALT: 50 IU/L — ABNORMAL HIGH (ref 0–32)
AST: 37 IU/L (ref 0–40)
Albumin/Globulin Ratio: 1.7 (ref 1.2–2.2)
Albumin: 4.3 g/dL (ref 3.8–4.9)
Alkaline Phosphatase: 93 IU/L (ref 44–121)
BUN/Creatinine Ratio: 16 (ref 9–23)
BUN: 11 mg/dL (ref 6–24)
Bilirubin Total: 0.3 mg/dL (ref 0.0–1.2)
CO2: 24 mmol/L (ref 20–29)
Calcium: 9.7 mg/dL (ref 8.7–10.2)
Chloride: 101 mmol/L (ref 96–106)
Creatinine, Ser: 0.67 mg/dL (ref 0.57–1.00)
Globulin, Total: 2.5 g/dL (ref 1.5–4.5)
Glucose: 135 mg/dL — ABNORMAL HIGH (ref 65–99)
Potassium: 4.5 mmol/L (ref 3.5–5.2)
Sodium: 142 mmol/L (ref 134–144)
Total Protein: 6.8 g/dL (ref 6.0–8.5)
eGFR: 101 mL/min/{1.73_m2} (ref >59)

## 2021-01-10 LAB — T4, FREE: Free T4: 1.04 ng/dL (ref 0.82–1.77)

## 2021-01-10 LAB — TSH: TSH: 1.87 u[IU]/mL (ref 0.450–4.500)

## 2021-01-16 NOTE — Patient Instructions (Signed)

## 2021-01-17 ENCOUNTER — Ambulatory Visit (INDEPENDENT_AMBULATORY_CARE_PROVIDER_SITE_OTHER): Payer: Medicaid Other | Admitting: Nurse Practitioner

## 2021-01-17 ENCOUNTER — Encounter: Payer: Self-pay | Admitting: Nurse Practitioner

## 2021-01-17 ENCOUNTER — Other Ambulatory Visit: Payer: Self-pay

## 2021-01-17 ENCOUNTER — Other Ambulatory Visit: Payer: Self-pay | Admitting: Nurse Practitioner

## 2021-01-17 VITALS — BP 170/84 | HR 84 | Ht 65.0 in | Wt 248.0 lb

## 2021-01-17 DIAGNOSIS — E039 Hypothyroidism, unspecified: Secondary | ICD-10-CM

## 2021-01-17 DIAGNOSIS — E118 Type 2 diabetes mellitus with unspecified complications: Secondary | ICD-10-CM

## 2021-01-17 DIAGNOSIS — E1165 Type 2 diabetes mellitus with hyperglycemia: Secondary | ICD-10-CM

## 2021-01-17 DIAGNOSIS — I10 Essential (primary) hypertension: Secondary | ICD-10-CM

## 2021-01-17 DIAGNOSIS — E782 Mixed hyperlipidemia: Secondary | ICD-10-CM | POA: Diagnosis not present

## 2021-01-17 DIAGNOSIS — E559 Vitamin D deficiency, unspecified: Secondary | ICD-10-CM | POA: Diagnosis not present

## 2021-01-17 DIAGNOSIS — Z794 Long term (current) use of insulin: Secondary | ICD-10-CM | POA: Diagnosis not present

## 2021-01-17 DIAGNOSIS — IMO0002 Reserved for concepts with insufficient information to code with codable children: Secondary | ICD-10-CM

## 2021-01-17 LAB — POCT GLYCOSYLATED HEMOGLOBIN (HGB A1C): Hemoglobin A1C: 9 % — AB (ref 4.0–5.6)

## 2021-01-17 MED ORDER — NOVOLOG FLEXPEN 100 UNIT/ML ~~LOC~~ SOPN: 25.0000 [IU] | PEN_INJECTOR | Freq: Three times a day (TID) | SUBCUTANEOUS | 3 refills | Status: DC

## 2021-01-17 NOTE — Progress Notes (Signed)
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   Anaphylactic shock, unspecified, subsequent encounter    Anemia, iron deficiency 02/14/2015   Angio-edema    Arthritis    Asthma    Asymptomatic varicose veins of right lower extremity    Chronic obstructive pulmonary disease with (acute) exacerbation (HCC)    Chronic rhinitis 05/21/2018   COVID-19    Diabetes mellitus    Essential (primary) hypertension    Family history of colon cancer 09/03/2012   Fatty liver    Gastro-esophageal reflux disease without esophagitis    GERD (gastroesophageal reflux disease)    History of colonic polyps 08/26/2017   Hypercholesteremia    Hypertension    Hypothyroidism    Low back pain    Mass of finger, right s/p surgical excision (mucoid cyst) 04/17/18 04/22/2018   Mucous cyst of digit of right hand    Obesity, unspecified    Other adverse food reactions, not elsewhere classified, initial encounter    Personal history of noncompliance with medical treatment, presenting hazards to health 05/18/2015   Rash and other nonspecific skin eruption    Sleep apnea    pt siad, "i had small amount but could not tolerate CPAP and they said it was ok since it was mild.   Sleep apnea, unspecified    Unspecified asthma, uncomplicated    Zoster without complications    Past Surgical History:  Procedure Laterality Date   ABDOMINAL HYSTERECTOMY     CHOLECYSTECTOMY     COLONOSCOPY N/A 09/22/2012   RMR: colonic polyps -removed as described above. tubular adenoma, next TCS 09/2017   COLONOSCOPY WITH PROPOFOL N/A 09/23/2017   Procedure: COLONOSCOPY WITH PROPOFOL;  Surgeon: Daneil Dolin, MD;  [facility]: [facility];  Service: Endoscopy;  Laterality: N/A;  7:30am   CYST EXCISION Left  07/19/2016   Procedure: CYST REMOVAL LEFT RING FINGER;  Surgeon: Carole Civil, MD;  Location: [facility];  Service: Orthopedics;  Laterality: Left;   CYST REMOVAL LEG Right    foot   ESOPHAGOGASTRODUODENOSCOPY N/A 10/21/2014   RMR: Small hiatal hernia; otherwise normal EGD status post passage of a Maloney dilator.    EXCISION MASS UPPER EXTREMETIES Right 04/17/2018   Procedure: EXCISION MASS UPPER EXTREMETIES right ring finger;  Surgeon: Carole Civil, MD;  Location: [facility];  Service: Orthopedics;  Laterality: Right;   MALONEY DILATION N/A 10/21/2014   Procedure: [name] DILATION;  Surgeon: Daneil Dolin, MD;  Location: [facility];  Service: Endoscopy;  Laterality: N/A;   MIDDLE EAR SURGERY Right    patched hole in ear drum   POLYPECTOMY  09/23/2017   Procedure: POLYPECTOMY;  Surgeon: Daneil Dolin, MD;  Location: [facility];  Service: Endoscopy;;  polyp hepatic flexure polyp cs, splenic flexure polyp cs, rectal polyp cs   Social History   Socioeconomic History   Marital status: Divorced    Spouse name: Not on file   Number of children: 2   Years of education: Not on file   Highest education level: Not on file  Occupational History   Not on file  Tobacco Use   Smoking status: Never   Smokeless tobacco: Never  Vaping Use   Vaping Use:  Never used  Substance and Sexual Activity   Alcohol use: No    Alcohol/week: 0.0 standard drinks   Drug use: No   Sexual activity: Yes    Birth control/protection: Surgical  Other Topics Concern   Not on file  Social History Narrative   Lives with grandson (since born)      Enjoys: vacation, travel, concerts      Diet: eats all food groups    Caffeine: diet Pepsi daily    Water:  8 cups daily       Wears seat belt    Does use phone   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   ACCU-CHEK GUIDE test strip USE TO TEST 4 TIMES DAILY.   aspirin EC 81 MG tablet Take 81 mg by mouth daily.   cetirizine (ZYRTEC) 10 MG tablet Take 1 tablet (10 mg total) by mouth daily.   Cholecalciferol (VITAMIN D-3) 1000 UNITS CAPS Take 1,000 Units by mouth daily.    diclofenac Sodium (VOLTAREN) 1 % GEL Apply 2 g topically 2 (two) times daily as needed.   Diclofenac Sodium 3 % GEL APPLY 2 GRAMS TO AFFECTED AREAS 2 TIMES A DAY AS NEEDED   EPINEPHrine 0.3 mg/0.3 mL IJ SOAJ injection Inject 0.3 mg into the muscle as needed for anaphylaxis.   fluticasone (FLONASE) 50 MCG/ACT nasal spray Place 1 spray into both nostrils daily.   glipiZIDE (GLUCOTROL XL) 10 MG 24 hr tablet Take 1 tablet (10 mg total) by mouth daily with breakfast.   HYDROcodone-acetaminophen (NORCO) 7.5-325 MG tablet Take by mouth.   insulin aspart (NOVOLOG FLEXPEN) 100 UNIT/ML FlexPen Inject 25-31 Units into the skin 3 (three) times daily with meals.   ketoconazole (NIZORAL) 2 % cream Apply 1 application topically daily as needed for irritation.   LANTUS SOLOSTAR 100 UNIT/ML Solostar Pen INJECT 80 UNITS SUBCUTANEOUSLY INTO THE SKIN DAILY.   levothyroxine (SYNTHROID) 75 MCG tablet Take 1 tablet by mouth six days of the week before breakfast   lisinopril (ZESTRIL) 10 MG tablet Take 1 tablet (10 mg total) by mouth daily.   LITETOUCH PEN NEEDLES 31G X 8 MM MISC USE AS DIRECTED FOUR TIMES DAILY.   metFORMIN (GLUCOPHAGE-XR) 500 MG 24 hr tablet TAKE ONE TABLET BY MOUTH TWICE A DAY AFTER A MEAL   pantoprazole (PROTONIX) 40 MG tablet TAKE ONE TABLET (40MG TOTAL) BY MOUTH DAILY   pregabalin (LYRICA) 150 MG capsule Take 150 mg by mouth 2 (two) times daily.   PROAIR HFA 108 (90 Base) MCG/ACT inhaler INHALE 4 PUFFS INTO THE  LUNGS EVERY 6 HOURS AS NEEDED FOR WHEEZING OR SHORTNESS OF BREATH   triamcinolone cream (KENALOG) 0.1 % Apply 1 application topically 2 (two) times daily.   vitamin B-12 (CYANOCOBALAMIN) 500 MCG tablet Take 500 mcg by mouth daily.   [DISCONTINUED] NOVOLOG FLEXPEN 100 UNIT/ML FlexPen INJECT 22-28 UNITS INTO THE SKIN THREE TIMES DAILY WITH MEALS   [DISCONTINUED] simvastatin (ZOCOR) 10 MG tablet TAKE ONE (1) TABLET BY MOUTH EVERY DAY   No facility-administered encounter medications on file as of 01/17/2021.  ALLERGIES: Allergies  Allergen Reactions   Kiwi Extract Anaphylaxis, Swelling and Palpitations   Other Anaphylaxis    Walnuts (avoids all tree nuts)   VACCINATION STATUS: Immunization History  Administered Date(s) Administered   Influenza,inj,Quad PF,6+ Mos 03/30/2019   Influenza-Unspecified 04/17/2011, 03/24/2012, 05/12/2013, 02/23/2015, 04/04/2017, 04/08/2018   Moderna Sars-Covid-2 Vaccination 10/22/2019, 11/19/2019    Diabetes She presents for her follow-up diabetic visit. She has type 2 diabetes mellitus. Onset time: She was diagnosed at approximate age of 41 years. Her disease course has been improving. There are no hypoglycemic associated symptoms. Pertinent negatives for hypoglycemia include no confusion, nervousness/anxiousness, pallor, seizures or tremors. Associated symptoms include polydipsia and polyuria. Pertinent negatives for diabetes include no fatigue, no polyphagia and no weight loss. There are no hypoglycemic complications. Symptoms are stable. There are no diabetic complications. Risk factors for coronary artery disease include dyslipidemia, diabetes mellitus, hypertension, sedentary lifestyle, obesity, post-menopausal and stress. Current diabetic treatment includes oral agent (dual therapy) and intensive insulin program. She is compliant with treatment most of the time. Her weight is fluctuating minimally. She is following a generally unhealthy diet. When asked about  meal planning, she reported none. She has had a previous visit with a dietitian. She never participates in exercise. Her home blood glucose trend is fluctuating minimally. Her breakfast blood glucose range is generally 180-200 mg/dl. Her lunch blood glucose range is generally >200 mg/dl. Her dinner blood glucose range is generally >200 mg/dl. Her bedtime blood glucose range is generally >200 mg/dl. Her overall blood glucose range is >200 mg/dl. (She presents today with her meter and logs showing persistent hyperglycemia both fasting and postprandially.  Her POCT A1c today is 9%, improving slightly from last visit of 9.2%.  At last visit, she did not want to have her medications changed, said she would do better with diet and exercise.  She denies any hypoglycemia.  Analysis of her meter shows 7-day average of 218, 14-day average of 217, 30-day average of 214, 90-day average of 228.) An ACE inhibitor/angiotensin II receptor blocker is being taken. She sees a podiatrist.Eye exam is current.  Hyperlipidemia This is a chronic problem. The current episode started more than 1 year ago. The problem is controlled. Recent lipid tests were reviewed and are normal. Exacerbating diseases include chronic renal disease, diabetes, hypothyroidism and obesity. There are no known factors aggravating her hyperlipidemia. Pertinent negatives include no myalgias. Current antihyperlipidemic treatment includes statins. The current treatment provides significant improvement of lipids. Compliance problems include medication cost.  Risk factors for coronary artery disease include diabetes mellitus, dyslipidemia, hypertension, obesity, a sedentary lifestyle and post-menopausal.  Hypertension This is a chronic problem. The current episode started more than 1 year ago. The problem has been waxing and waning since onset. The problem is uncontrolled. Pertinent negatives include no palpitations. Agents associated with hypertension include  thyroid hormones. Risk factors for coronary artery disease include diabetes mellitus, dyslipidemia, obesity, post-menopausal state, sedentary lifestyle and stress. Past treatments include ACE inhibitors. The current treatment provides mild improvement. There are no compliance problems.  Identifiable causes of hypertension include chronic renal disease, sleep apnea and a thyroid problem.  Thyroid Problem Presents for follow-up visit. Patient reports no anxiety, cold intolerance, constipation, depressed mood, diarrhea, dry skin, fatigue, hair loss, heat intolerance, palpitations, tremors, weight gain or weight loss. Her past medical history is significant for diabetes and hyperlipidemia.    Review of systems  Constitutional: + Minimally fluctuating body weight,  current Body mass index is 41.27 kg/m. ,  no fatigue, no subjective hyperthermia, no subjective hypothermia Eyes: no blurry vision, no xerophthalmia ENT: no sore throat, no nodules palpated in throat, no dysphagia/odynophagia, no hoarseness Cardiovascular: no chest pain, no shortness of breath, no palpitations, no leg swelling Respiratory: no cough, no shortness of breath Gastrointestinal: no nausea/vomiting/diarrhea Musculoskeletal: no muscle/joint aches Skin: no rashes, no hyperemia Neurological: no tremors, no numbness, no tingling, no dizziness Psychiatric: no depression, no anxiety   Objective:    BP (!) 170/84   Pulse 84   Ht '5\' 5"'  (1.651 m)   Wt 248 lb (112.5 kg)   BMI 41.27 kg/m   Wt Readings from Last 3 Encounters:  01/17/21 248 lb (112.5 kg)  01/02/21 248 lb 12.8 oz (112.9 kg)  12/07/20 244 lb (110.7 kg)    BP Readings from Last 3 Encounters:  01/17/21 (!) 170/84  01/02/21 (!) 170/90  12/07/20 (!) 163/81     Physical Exam- Limited  Constitutional:  Body mass index is 41.27 kg/m. , not in acute distress, normal state of mind Eyes:  EOMI, no exophthalmos Neck: Supple Cardiovascular: RRR, no murmurs, rubs, or  gallops, no edema Respiratory: Adequate breathing efforts, no crackles, rales, rhonchi, or wheezing Musculoskeletal: no gross deformities, strength intact in all four extremities, no gross restriction of joint movements Skin:  no rashes, no hyperemia Neurological: no tremor with outstretched hands    Lipid Panel     Component Value Date/Time   CHOL 151 01/09/2021 0828   TRIG 89 01/09/2021 0828   HDL 50 01/09/2021 0828   CHOLHDL 3.0 01/09/2021 0828   CHOLHDL 2.4 12/25/2019 0709   VLDL 10 03/07/2016 0705   LDLCALC 84 01/09/2021 0828   LDLCALC 65 12/25/2019 0709    Recent Results (from the past 2160 hour(s))  POCT Urinalysis Dipstick     Status: Abnormal   Collection Time: 12/07/20  9:03 AM  Result Value Ref Range   Color, UA yellow    Clarity, UA clear    Glucose, UA Negative Negative   Bilirubin, UA negative    Ketones, UA negative    Spec Grav, UA 1.025 1.010 - 1.025   Blood, UA negative    pH, UA 5.0 5.0 - 8.0   Protein, UA Negative Negative   Urobilinogen, UA 0.2 0.2 or 1.0 E.U./dL   Nitrite, UA negative    Leukocytes, UA Small (1+) (A) Negative   Appearance yellow    Odor foul   CBC with Differential     Status: None   Collection Time: 12/07/20  9:10 AM  Result Value Ref Range   WBC 4.8 3.4 - 10.8 x10E3/uL   RBC 4.68 3.77 - 5.28 x10E6/uL   Hemoglobin 12.9 11.1 - 15.9 g/dL   Hematocrit 39.8 34.0 - 46.6 %   MCV 85 79 - 97 fL   MCH 27.6 26.6 - 33.0 pg   MCHC 32.4 31.5 - 35.7 g/dL   RDW 13.7 11.7 - 15.4 %   Platelets 236 150 - 450 x10E3/uL   Neutrophils 55 Not Estab. %   Lymphs 36 Not Estab. %   Monocytes 6 Not Estab. %   Eos 2 Not Estab. %   Basos 1 Not Estab. %   Neutrophils Absolute 2.6 1.4 - 7.0 x10E3/uL   Lymphocytes Absolute 1.7 0.7 - 3.1 x10E3/uL   Monocytes Absolute 0.3 0.1 - 0.9 x10E3/uL   EOS (ABSOLUTE) 0.1 0.0 - 0.4 x10E3/uL   Basophils Absolute 0.0 0.0 - 0.2 x10E3/uL   Immature Granulocytes 0 Not  Estab. %   Immature Grans (Abs) 0.0 0.0 - 0.1  x10E3/uL  CMP14+EGFR     Status: Abnormal   Collection Time: 12/07/20  9:10 AM  Result Value Ref Range   Glucose 200 (H) 65 - 99 mg/dL   BUN 10 6 - 24 mg/dL   Creatinine, Ser 0.78 0.57 - 1.00 mg/dL   eGFR 87 >59 mL/min/1.73   BUN/Creatinine Ratio 13 9 - 23   Sodium 140 134 - 144 mmol/L   Potassium 4.6 3.5 - 5.2 mmol/L   Chloride 100 96 - 106 mmol/L   CO2 24 20 - 29 mmol/L   Calcium 9.7 8.7 - 10.2 mg/dL   Total Protein 7.1 6.0 - 8.5 g/dL   Albumin 4.3 3.8 - 4.9 g/dL   Globulin, Total 2.8 1.5 - 4.5 g/dL   Albumin/Globulin Ratio 1.5 1.2 - 2.2   Bilirubin Total 0.4 0.0 - 1.2 mg/dL   Alkaline Phosphatase 104 44 - 121 IU/L   AST 50 (H) 0 - 40 IU/L   ALT 66 (H) 0 - 32 IU/L  Urine Culture     Status: None   Collection Time: 12/07/20  9:35 AM   Specimen: Urine   UR  Result Value Ref Range   Urine Culture, Routine Final report    Organism ID, Bacteria Comment     Comment: Mixed urogenital flora 25,000-50,000 colony forming units per mL   Comprehensive metabolic panel     Status: Abnormal   Collection Time: 01/09/21  8:28 AM  Result Value Ref Range   Glucose 135 (H) 65 - 99 mg/dL   BUN 11 6 - 24 mg/dL   Creatinine, Ser 0.67 0.57 - 1.00 mg/dL   eGFR 101 >59 mL/min/1.73   BUN/Creatinine Ratio 16 9 - 23   Sodium 142 134 - 144 mmol/L   Potassium 4.5 3.5 - 5.2 mmol/L   Chloride 101 96 - 106 mmol/L   CO2 24 20 - 29 mmol/L   Calcium 9.7 8.7 - 10.2 mg/dL   Total Protein 6.8 6.0 - 8.5 g/dL   Albumin 4.3 3.8 - 4.9 g/dL   Globulin, Total 2.5 1.5 - 4.5 g/dL   Albumin/Globulin Ratio 1.7 1.2 - 2.2   Bilirubin Total 0.3 0.0 - 1.2 mg/dL   Alkaline Phosphatase 93 44 - 121 IU/L   AST 37 0 - 40 IU/L   ALT 50 (H) 0 - 32 IU/L  Lipid panel     Status: None   Collection Time: 01/09/21  8:28 AM  Result Value Ref Range   Cholesterol, Total 151 100 - 199 mg/dL   Triglycerides 89 0 - 149 mg/dL   HDL 50 >39 mg/dL   VLDL Cholesterol Cal 17 5 - 40 mg/dL   LDL Chol Calc (NIH) 84 0 - 99 mg/dL    Chol/HDL Ratio 3.0 0.0 - 4.4 ratio    Comment:                                   T. Chol/HDL Ratio                                             Men  Women  1/2 Avg.Risk  3.4    3.3                                   Avg.Risk  5.0    4.4                                2X Avg.Risk  9.6    7.1                                3X Avg.Risk 23.4   11.0   T4, free     Status: None   Collection Time: 01/09/21  8:28 AM  Result Value Ref Range   Free T4 1.04 0.82 - 1.77 ng/dL  TSH     Status: None   Collection Time: 01/09/21  8:28 AM  Result Value Ref Range   TSH 1.870 0.450 - 4.500 uIU/mL  HgB A1c     Status: Abnormal   Collection Time: 01/17/21 10:45 AM  Result Value Ref Range   Hemoglobin A1C 9.0 (A) 4.0 - 5.6 %   HbA1c POC (<> result, manual entry)     HbA1c, POC (prediabetic range)     HbA1c, POC (controlled diabetic range)       Assessment & Plan:   1) Uncontrolled type 2 diabetes mellitus with complication, with long-term current use of insulin ([place])  She presents today with her meter and logs showing persistent hyperglycemia both fasting and postprandially.  Her POCT A1c today is 9%, improving slightly from last visit of 9.2%.  At last visit, she did not want to have her medications changed, said she would do better with diet and exercise.  She denies any hypoglycemia.  Analysis of her meter shows 7-day average of 218, 14-day average of 217, 30-day average of 214, 90-day average of 228.  -Her diabetes is complicated by history of noncompliance (she recently showed better engagement) and patient remains at extremely high risk for more acute and chronic complications of diabetes which include CAD, CVA, CKD, retinopathy, and neuropathy. These are all discussed in detail with the patient.  - Nutritional counseling repeated at each appointment due to patients tendency to fall back in to old habits.  - The patient admits there is a room for improvement in their  diet and drink choices. -  Suggestion is made for the patient to avoid simple carbohydrates from their diet including Cakes, Sweet Desserts / Pastries, Ice Cream, Soda (diet and regular), Sweet Tea, Candies, Chips, Cookies, Sweet Pastries, Store Bought Juices, Alcohol in Excess of 1-2 drinks a day, Artificial Sweeteners, Coffee Creamer, and "Sugar-free" Products. This will help patient to have stable blood glucose profile and potentially avoid unintended weight gain.   - I encouraged the patient to switch to unprocessed or minimally processed complex starch and increased protein intake (animal or plant source), fruits, and vegetables.   - Patient is advised to stick to a routine mealtimes to eat 3 meals a day and avoid unnecessary snacks (to snack only to correct hypoglycemia).  - I have approached patient with the following individualized plan to manage diabetes and patient agrees.  -She will continue to require intensive treatment with  basal/bolus insulin in order for her to achieve and maintain control of diabetes to target.  -Based on her continued  hyperglycemia, she will tolerate increase in her Novolog to 25-31 units SQ TID with meals if glucose is above 90 and she is eating (Specific instructions on how to titrate insulin dosage based on glucose readings given to patient in writing).  She can continue current dose of Lantus 80 units SQ nightly (says insurance will not pay for higher doses).  She can also continue Metformin 500 mg ER twice daily with meals and Glipizide 10 mg XL daily with breakfast.   -Insurance did not provide optimal coverage for incretin therapy like Ozempic  -She is encouraged to continue monitoring blood glucose 4 times daily, before meals and before bed, and to call the clinic if she has readings less than 70 or greater than 300 for 3 tests in a row.  2) Lipids/HPL:  Her most recent lipid panel from 01/09/21 shows controlled LDL of 84.  She was taken off her Statin due  to elevated liver enzymes.  Side effects and precautions discussed with her.    3) Hypertension:  Her blood pressure is not controlled to target.  She is advised to continue Lisinopril 10 mg po daily (managed and recently increased by her PCP).  4) Hypothyroidism:  -Her previsit thyroid function tests are consistent with appropriate hormone replacement.  She is advised to continue Levothyroxine 75 mcg po daily before breakfast (skipping 2 days out of the week).    - We discussed about the correct intake of her thyroid hormone, on empty stomach at fasting, with water, separated by at least 30 minutes from breakfast and other medications,  and separated by more than 4 hours from calcium, iron, multivitamins, acid reflux medications (PPIs). -Patient is made aware of the fact that thyroid hormone replacement is needed for life, dose to be adjusted by periodic monitoring of thyroid function tests.    - I advised patient to maintain close follow up with Lindell Spar, MD for primary care needs.      I spent 30 minutes in the care of the patient today including review of labs from [place], Lipids, Thyroid Function, Hematology (current and previous including abstractions from other facilities); face-to-face time discussing  her blood glucose readings/logs, discussing hypoglycemia and hyperglycemia episodes and symptoms, medications doses, her options of short and long term treatment based on the latest standards of care / guidelines;  discussion about incorporating lifestyle medicine;  and documenting the encounter.    Please refer to Patient Instructions for Blood Glucose Monitoring and Insulin/Medications Dosing Guide"  in media tab for additional information. Please  also refer to " Patient Self Inventory" in the Media  tab for reviewed elements of pertinent patient history.  Gillian Scarce participated in the discussions, expressed understanding, and voiced agreement with the above plans.  All  questions were answered to her satisfaction. she is encouraged to contact clinic should she have any questions or concerns prior to her return visit.    Follow up plan: -Return in about 3 months (around 04/19/2021) for Diabetes F/U with A1c in office, No previsit labs, Bring meter and logs.  Rayetta Pigg, [facility] [address] [place], [place] [address] Phone: [phone] Fax: [phone]   01/17/2021, 11:04 AM

## 2021-01-18 ENCOUNTER — Other Ambulatory Visit: Payer: Self-pay | Admitting: Nurse Practitioner

## 2021-01-18 ENCOUNTER — Other Ambulatory Visit: Payer: Self-pay

## 2021-01-18 DIAGNOSIS — E1165 Type 2 diabetes mellitus with hyperglycemia: Secondary | ICD-10-CM

## 2021-01-18 DIAGNOSIS — IMO0002 Reserved for concepts with insufficient information to code with codable children: Secondary | ICD-10-CM

## 2021-01-18 MED ORDER — NOVOLOG FLEXPEN 100 UNIT/ML ~~LOC~~ SOPN: 25.0000 [IU] | PEN_INJECTOR | Freq: Three times a day (TID) | SUBCUTANEOUS | 0 refills | Status: DC

## 2021-02-10 ENCOUNTER — Other Ambulatory Visit: Payer: Self-pay | Admitting: Nurse Practitioner

## 2021-02-10 ENCOUNTER — Other Ambulatory Visit: Payer: Self-pay | Admitting: Family Medicine

## 2021-02-10 DIAGNOSIS — IMO0002 Reserved for concepts with insufficient information to code with codable children: Secondary | ICD-10-CM

## 2021-02-10 DIAGNOSIS — E1165 Type 2 diabetes mellitus with hyperglycemia: Secondary | ICD-10-CM

## 2021-02-11 ENCOUNTER — Other Ambulatory Visit: Payer: Self-pay | Admitting: Family Medicine

## 2021-02-11 DIAGNOSIS — E1165 Type 2 diabetes mellitus with hyperglycemia: Secondary | ICD-10-CM

## 2021-02-11 DIAGNOSIS — IMO0002 Reserved for concepts with insufficient information to code with codable children: Secondary | ICD-10-CM

## 2021-02-13 ENCOUNTER — Other Ambulatory Visit: Payer: Self-pay

## 2021-02-13 ENCOUNTER — Ambulatory Visit: Payer: Medicaid Other | Admitting: Internal Medicine

## 2021-02-13 ENCOUNTER — Other Ambulatory Visit: Payer: Self-pay | Admitting: *Deleted

## 2021-02-13 ENCOUNTER — Encounter: Payer: Self-pay | Admitting: Internal Medicine

## 2021-02-13 VITALS — BP 174/70 | HR 86 | Temp 98.4°F | Resp 18 | Ht 65.0 in | Wt 245.0 lb

## 2021-02-13 DIAGNOSIS — IMO0002 Reserved for concepts with insufficient information to code with codable children: Secondary | ICD-10-CM

## 2021-02-13 DIAGNOSIS — E1165 Type 2 diabetes mellitus with hyperglycemia: Secondary | ICD-10-CM

## 2021-02-13 DIAGNOSIS — I10 Essential (primary) hypertension: Secondary | ICD-10-CM

## 2021-02-13 DIAGNOSIS — E118 Type 2 diabetes mellitus with unspecified complications: Secondary | ICD-10-CM | POA: Diagnosis not present

## 2021-02-13 DIAGNOSIS — Z794 Long term (current) use of insulin: Secondary | ICD-10-CM | POA: Diagnosis not present

## 2021-02-13 MED ORDER — LITETOUCH PEN NEEDLES 31G X 8 MM MISC: 1.0000 | Freq: Four times a day (QID) | 0 refills | Status: DC

## 2021-02-13 MED ORDER — LISINOPRIL 20 MG PO TABS: 20.0000 mg | ORAL_TABLET | Freq: Every day | ORAL | 1 refills | Status: DC

## 2021-02-13 MED ORDER — DICLOFENAC SODIUM 3 % EX GEL: CUTANEOUS | 0 refills | Status: DC

## 2021-02-13 MED ORDER — ACCU-CHEK GUIDE VI STRP
ORAL_STRIP | 1 refills | Status: DC
Start: 2021-02-13 — End: 2021-04-11

## 2021-02-13 MED ORDER — ACCU-CHEK FASTCLIX LANCETS MISC: 1 refills | Status: DC

## 2021-02-13 NOTE — Patient Instructions (Signed)
Please start taking Lisinopril 20 mg instead of 10 mg.  Please follow low salt diet and perform moderate exercise/walking at least 150 mins/week.

## 2021-02-17 NOTE — Progress Notes (Signed)
Established Patient Office Visit  Subjective:  Patient ID: Sandy Valencia, female    DOB: [birth year]  Age: 60 y.o. MRN: [id number]  CC:  Chief Complaint  Patient presents with   Follow-up    6 week follow up     HPI Sandy Valencia presents for f/u of HTN and DM.  HTN: Her BP is still uncontrolled. She has not taken her medication today. She denies any headache, dizziness, chest pain, dyspnea or palpitations.  DM: She sees Endocrinologist for DM. She is on Lantus and Glipizide. She also follows insulin sliding scale. Denies any polyuria or polyphagia.  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   Anaphylactic shock, unspecified, subsequent encounter    Anemia, iron deficiency 02/14/2015   Angio-edema    Arthritis    Asthma    Asymptomatic varicose veins of right lower extremity    Chronic obstructive pulmonary disease with (acute) exacerbation (HCC)    Chronic rhinitis 05/21/2018   COVID-19    Diabetes mellitus    Essential (primary) hypertension    Family history of colon cancer 09/03/2012   Fatty liver    Gastro-esophageal reflux disease without esophagitis    GERD (gastroesophageal reflux disease)    History of colonic polyps 08/26/2017   Hypercholesteremia    Hypertension    Hypothyroidism    Low back pain    Mass of finger, right s/p surgical excision (mucoid cyst) 04/17/18 04/22/2018   Mucous cyst of digit of right hand    Obesity, unspecified    Other adverse food reactions, not elsewhere classified, initial encounter    Personal history of noncompliance with medical treatment, presenting hazards to health 05/18/2015   Rash and other nonspecific skin eruption    Sleep apnea    pt siad, "i had small amount but could not tolerate CPAP and they said it was ok since it was mild.   Sleep apnea, unspecified    Unspecified asthma, uncomplicated    Zoster without complications      Past Surgical History:  Procedure Laterality Date   ABDOMINAL HYSTERECTOMY     CHOLECYSTECTOMY     COLONOSCOPY N/A 09/22/2012   RMR: colonic polyps -removed as described above. tubular adenoma, next TCS 09/2017   COLONOSCOPY WITH PROPOFOL N/A 09/23/2017   Procedure: COLONOSCOPY WITH PROPOFOL;  Surgeon: Daneil Dolin, MD;  [facility];  Service: Endoscopy;  Laterality: N/A;  7:30am   CYST EXCISION Left 07/19/2016   Procedure: CYST REMOVAL LEFT RING FINGER;  Surgeon: Carole Civil, MD;  Location: [facility];  Service: Orthopedics;  Laterality: Left;   CYST REMOVAL LEG Right    foot   ESOPHAGOGASTRODUODENOSCOPY N/A 10/21/2014   RMR: Small hiatal hernia; otherwise normal EGD status post passage of a Maloney dilator.    EXCISION MASS UPPER EXTREMETIES Right 04/17/2018   Procedure: EXCISION MASS UPPER EXTREMETIES right ring finger;  Surgeon: Carole Civil, MD;  Location: [facility];  Service: Orthopedics;  Laterality: Right;   MALONEY DILATION N/A 10/21/2014   Procedure: [name] DILATION;  Surgeon: Daneil Dolin, MD;  Location: [facility];  Service: Endoscopy;  Laterality: N/A;   MIDDLE EAR SURGERY Right    patched hole in ear drum   POLYPECTOMY  09/23/2017   Procedure: POLYPECTOMY;  Surgeon: Daneil Dolin, MD;  [facility]: [facility];  Service: Endoscopy;;  polyp hepatic flexure polyp  cs, splenic flexure polyp cs, rectal polyp cs    Family History  Problem Relation Age of Onset   Colon cancer Mother        diagnosed age 89, underwent surgical resection, metastatic disease several years later   Diabetes Mother    Heart attack Father    Diabetes Sister    Hypothyroidism Brother    Diabetes Sister    Diabetes Sister    Diabetes Sister    Hypothyroidism Sister    Allergic rhinitis Neg Hx    Angioedema Neg Hx    Asthma Neg Hx    Atopy Neg Hx    Eczema Neg Hx    Immunodeficiency Neg Hx    Urticaria Neg Hx     Social History   Socioeconomic History   Marital  status: Divorced    Spouse name: Not on file   Number of children: 2   Years of education: Not on file   Highest education level: Not on file  Occupational History   Not on file  Tobacco Use   Smoking status: Never   Smokeless tobacco: Never  Vaping Use   Vaping Use: Never used  Substance and Sexual Activity   Alcohol use: No    Alcohol/week: 0.0 standard drinks   Drug use: No   Sexual activity: Yes    Birth control/protection: Surgical  Other Topics Concern   Not on file  Social History Narrative   Lives with grandson (since born)      Enjoys: vacation, travel, concerts      Diet: eats all food groups    Caffeine: diet Pepsi daily    Water:  8 cups daily       Wears seat belt    Does use phone   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     cetirizine (ZYRTEC) 10 MG tablet Take 1 tablet (10 mg total) by mouth daily. 30 tablet 3   Cholecalciferol (VITAMIN D-3) 1000 UNITS CAPS Take 1,000 Units by mouth daily.      diclofenac Sodium (VOLTAREN) 1 % GEL Apply 2 g topically 2 (two) times daily as needed. 50 g 0   EPINEPHrine 0.3 mg/0.3 mL IJ SOAJ injection Inject 0.3 mg into the muscle as needed for anaphylaxis. 2 each 1   fluticasone (FLONASE) 50 MCG/ACT nasal spray Place 1 spray into both nostrils daily. 16 g 3   glipiZIDE (GLUCOTROL XL) 10 MG 24 hr tablet Take 1 tablet (10 mg total) by mouth daily with breakfast. 90 tablet 3   HYDROcodone-acetaminophen (NORCO) 7.5-325 MG tablet Take by mouth.     insulin aspart (NOVOLOG FLEXPEN) 100 UNIT/ML FlexPen Inject 25-31 Units into the skin 3  (three) times daily with meals. 90 mL 0   ketoconazole (NIZORAL) 2 % cream Apply 1 application topically daily as needed for irritation. 15 g 0   LANTUS SOLOSTAR 100 UNIT/ML Solostar Pen INJECT 80 UNITS SUBCUTANEOUSLY INTO THE SKIN DAILY. 30 mL 2   levothyroxine (SYNTHROID) 75 MCG tablet Take 1 tablet by  mouth six days of the week before breakfast 90 tablet 0   metFORMIN (GLUCOPHAGE-XR) 500 MG 24 hr tablet TAKE ONE TABLET BY MOUTH TWICE A DAY AFTER A MEAL 180 tablet 0   pantoprazole (PROTONIX) 40 MG tablet TAKE ONE TABLET (40MG TOTAL) BY MOUTH DAILY 30 tablet 4   pregabalin (LYRICA) 150 MG capsule Take 150 mg by mouth 2 (two) times daily.     PROAIR HFA 108 (90 Base) MCG/ACT inhaler INHALE 4 PUFFS INTO THE LUNGS EVERY 6 HOURS AS NEEDED FOR WHEEZING OR SHORTNESS OF BREATH 8.5 g 1   triamcinolone cream (KENALOG) 0.1 % Apply 1 application topically 2 (two) times daily. 30 g 0   vitamin B-12 (CYANOCOBALAMIN) 500 MCG tablet Take 500 mcg by mouth daily.     Accu-Chek FastClix Lancets MISC USE TO TEST 4 TIMES DAILY. 102 each 0   ACCU-CHEK GUIDE test strip USE TO TEST 4 TIMES DAILY. 100 strip 0   Diclofenac Sodium 3 % GEL APPLY 2 GRAMS TO AFFECTED AREAS 2 TIMES A DAY AS NEEDED 100 g 0   lisinopril (ZESTRIL) 10 MG tablet Take 1 tablet (10 mg total) by mouth daily. 60 tablet 0   LITETOUCH PEN NEEDLES 31G X 8 MM MISC USE AS DIRECTED FOUR TIMES DAILY. 100 each 0   No facility-administered medications prior to visit.    Allergies  Allergen Reactions   Kiwi Extract Anaphylaxis, Swelling and Palpitations   Other Anaphylaxis    Walnuts (avoids all tree nuts)    ROS Review of Systems  Constitutional:  Negative for chills and fever.  HENT:  Negative for congestion, sinus pressure, sinus pain and sore throat.   Eyes:  Negative for pain and discharge.  Respiratory:  Negative for cough and shortness of breath.   Cardiovascular:  Negative for chest pain and palpitations.  Gastrointestinal:  Negative for  abdominal pain, constipation, diarrhea, nausea and vomiting.  Endocrine: Negative for polydipsia and polyuria.  Genitourinary:  Negative for dysuria and hematuria.  Musculoskeletal:  Positive for arthralgias and back pain. Negative for neck pain and neck stiffness.  Skin:  Negative for rash.  Neurological:  Negative for dizziness and weakness.  Psychiatric/Behavioral:  Negative for agitation and behavioral problems.      Objective:    Physical Exam Vitals reviewed.  Constitutional:      General: She is not in acute distress.    Appearance: She is obese. She is not diaphoretic.  HENT:     Head: Normocephalic and atraumatic.     Nose: Nose normal. No congestion.     Mouth/Throat:     Mouth: Mucous membranes are moist.     Pharynx: No posterior oropharyngeal erythema.  Eyes:     General: No scleral icterus.    Extraocular Movements: Extraocular movements intact.  Cardiovascular:     Rate and Rhythm: Normal rate and regular rhythm.     Pulses: Normal pulses.     Heart sounds: Normal heart sounds. No murmur heard. Pulmonary:     Breath sounds: Normal breath sounds. No wheezing or rales.  Abdominal:     Palpations: Abdomen is soft.     Tenderness: There is no abdominal tenderness.  Musculoskeletal:     Cervical back: Neck supple. No tenderness.     Right lower leg: No edema.     Left lower leg: No edema.  Skin:    General: Skin is warm.     Findings: No rash.  Neurological:     General:  No focal deficit present.     Mental Status: She is alert and oriented to person, place, and time.  Psychiatric:        Mood and Affect: Mood normal.        Behavior: Behavior normal.    BP (!) 174/70 (BP Location: Left Arm, Cuff Size: Normal)   Pulse 86   Temp 98.4 F (36.9 C) (Oral)   Resp 18   Ht _0  (1.651 m)   Wt 245 lb 0.6 oz (111.1 kg)   SpO2 94%   BMI 40.78 kg/m  Wt Readings from Last 3 Encounters:  02/13/21 245 lb 0.6 oz (111.1 kg)  01/17/21 248 lb (112.5 kg)   01/02/21 248 lb 12.8 oz (112.9 kg)     Health Maintenance Due  Topic Date Due   OPHTHALMOLOGY EXAM  Never done   HIV Screening  Never done   Hepatitis C Screening  Never done   TETANUS/TDAP  Never done   PAP SMEAR-Modifier  Never done   Zoster Vaccines- Shingrix (1 of 2) Never done   COVID-19 Vaccine (3 - Booster for Moderna series) 04/20/2020   FOOT EXAM  09/08/2020   INFLUENZA VACCINE  01/02/2021    There are no preventive care reminders to display for this patient.  Lab Results  Component Value Date   TSH 1.870 01/09/2021   Lab Results  Component Value Date   WBC 4.8 12/07/2020   HGB 12.9 12/07/2020   HCT 39.8 12/07/2020   MCV 85 12/07/2020   PLT 236 12/07/2020   Lab Results  Component Value Date   NA 142 01/09/2021   K 4.5 01/09/2021   CO2 24 01/09/2021   GLUCOSE 135 (H) 01/09/2021   BUN 11 01/09/2021   CREATININE 0.67 01/09/2021   BILITOT 0.3 01/09/2021   ALKPHOS 93 01/09/2021   AST 37 01/09/2021   ALT 50 (H) 01/09/2021   PROT 6.8 01/09/2021   ALBUMIN 4.3 01/09/2021   CALCIUM 9.7 01/09/2021   ANIONGAP 11 04/22/2018   EGFR 101 01/09/2021   Lab Results  Component Value Date   CHOL 151 01/09/2021   Lab Results  Component Value Date   HDL 50 01/09/2021   Lab Results  Component Value Date   LDLCALC 84 01/09/2021   Lab Results  Component Value Date   TRIG 89 01/09/2021   Lab Results  Component Value Date   CHOLHDL 3.0 01/09/2021   Lab Results  Component Value Date   HGBA1C 9.0 (A) 01/17/2021      Assessment & Plan:   Problem List Items Addressed This Visit       Cardiovascular and Mediastinum   Essential hypertension - Primary    BP Readings from Last 1 Encounters:  02/13/21 (!) 174/70  uncontrolled with Lisinopril 10 mg QD, increased to 20 mg QD today Counseled for compliance with the medications Advised DASH diet and moderate exercise/walking, at least 150 mins/week       Relevant Medications   lisinopril (ZESTRIL) 20 MG  tablet     Endocrine   Uncontrolled type 2 diabetes mellitus with complication, with long-term current use of insulin (HCC)    Lab Results  Component Value Date   HGBA1C 9.0 (A) 01/17/2021  Follows up with Endocrinology      Relevant Medications   lisinopril (ZESTRIL) 20 MG tablet    Meds ordered this encounter  Medications   lisinopril (ZESTRIL) 20 MG tablet    Sig: Take 1 tablet (20 mg total)  by mouth daily.    Dispense:  30 tablet    Refill:  1    Follow-up: Return in about 2 months (around 04/15/2021) for HTN.    Lindell Spar, MD

## 2021-02-17 NOTE — Assessment & Plan Note (Signed)
BP Readings from Last 1 Encounters:  02/13/21 (!) 174/70   uncontrolled with Lisinopril 10 mg QD, increased to 20 mg QD today Counseled for compliance with the medications Advised DASH diet and moderate exercise/walking, at least 150 mins/week

## 2021-02-17 NOTE — Assessment & Plan Note (Signed)
Lab Results  Component Value Date   HGBA1C 9.0 (A) 01/17/2021   Follows up with Endocrinology

## 2021-02-21 ENCOUNTER — Other Ambulatory Visit: Payer: Self-pay | Admitting: Nurse Practitioner

## 2021-02-21 DIAGNOSIS — M545 Low back pain, unspecified: Secondary | ICD-10-CM | POA: Diagnosis not present

## 2021-02-21 DIAGNOSIS — Z79891 Long term (current) use of opiate analgesic: Secondary | ICD-10-CM | POA: Diagnosis not present

## 2021-02-21 DIAGNOSIS — M79606 Pain in leg, unspecified: Secondary | ICD-10-CM | POA: Diagnosis not present

## 2021-02-21 DIAGNOSIS — M542 Cervicalgia: Secondary | ICD-10-CM | POA: Diagnosis not present

## 2021-02-21 DIAGNOSIS — I10 Essential (primary) hypertension: Secondary | ICD-10-CM | POA: Diagnosis not present

## 2021-03-14 DIAGNOSIS — H95121 Granulation of postmastoidectomy cavity, right ear: Secondary | ICD-10-CM | POA: Diagnosis not present

## 2021-03-14 DIAGNOSIS — H903 Sensorineural hearing loss, bilateral: Secondary | ICD-10-CM | POA: Diagnosis not present

## 2021-04-06 DIAGNOSIS — L03031 Cellulitis of right toe: Secondary | ICD-10-CM | POA: Diagnosis not present

## 2021-04-06 DIAGNOSIS — L03032 Cellulitis of left toe: Secondary | ICD-10-CM | POA: Diagnosis not present

## 2021-04-06 DIAGNOSIS — B351 Tinea unguium: Secondary | ICD-10-CM | POA: Diagnosis not present

## 2021-04-11 ENCOUNTER — Other Ambulatory Visit: Payer: Self-pay | Admitting: Internal Medicine

## 2021-04-12 ENCOUNTER — Other Ambulatory Visit: Payer: Self-pay

## 2021-04-12 ENCOUNTER — Encounter: Payer: Self-pay | Admitting: Allergy & Immunology

## 2021-04-12 ENCOUNTER — Ambulatory Visit: Payer: Medicaid Other | Admitting: Allergy & Immunology

## 2021-04-12 VITALS — BP 140/72 | HR 75 | Temp 98.2°F | Resp 18 | Ht 65.0 in | Wt 247.4 lb

## 2021-04-12 DIAGNOSIS — K219 Gastro-esophageal reflux disease without esophagitis: Secondary | ICD-10-CM | POA: Diagnosis not present

## 2021-04-12 DIAGNOSIS — J310 Chronic rhinitis: Secondary | ICD-10-CM

## 2021-04-12 DIAGNOSIS — J4520 Mild intermittent asthma, uncomplicated: Secondary | ICD-10-CM | POA: Diagnosis not present

## 2021-04-12 DIAGNOSIS — T7800XD Anaphylactic reaction due to unspecified food, subsequent encounter: Secondary | ICD-10-CM

## 2021-04-12 MED ORDER — EPINEPHRINE 0.3 MG/0.3ML IJ SOAJ: 0.3000 mg | INTRAMUSCULAR | 1 refills | Status: DC | PRN

## 2021-04-12 NOTE — Progress Notes (Signed)
FOLLOW UP  Date of Service/Encounter:  04/12/21   Assessment:   Mild intermittent asthma, uncomplicated   Adverse food reaction (walnut, kiwi, seafood)    Gastroesophageal reflux disease - controlled with a pantoprazole   Chronic nonallergic rhinitis - with negative environmental IgE panel in 2019   Plan/Recommendations:   1. Mild intermittent asthma, uncomplicated - Lung testing looked stable today. - We are not going to make any changes at this time.  - Continue with albuterol 4 puffs every 4-6 hours as needed.   - There is no need for a controller medication at this time.   2. Gastroesophageal reflux disease - Continue with pantoprazole 40mg  daily.  3. 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 - EpiPen refilled today.    4. Chronic rhinitis - Start fluticasone one spray per nostril daily. - You can also use cetirizine 10mg  daily.   5. Return in about 1 year (around 04/12/2022).  Subjective:   Sandy Valencia is a 60 y.o. female presenting today for follow up of  Chief Complaint  Patient presents with   Follow-up    Patient in today for a follow up and she has not problems.    Sandy Valencia has a history of the following: Patient Active Problem List   Diagnosis Date Noted   Fatigue 12/11/2020   Primary osteoarthritis involving multiple joints 12/02/2019   Long-term current use of opiate analgesic 09/08/2019   Pain in lower limb 09/08/2019   Chronic low back pain 09/07/2019   Neck pain 09/07/2019   Mild intermittent asthma without complication 95/62/1308   Gastroesophageal reflux disease 05/21/2018   Hypothyroidism 04/22/2017   Mixed hyperlipidemia 12/31/2016   Uncontrolled type 2 diabetes mellitus with complication, with long-term current use of insulin 05/18/2015   Essential hypertension 05/18/2015   Morbid obesity with body mass index (BMI) of 40.0 to 44.9 in adult [facility]) 05/18/2015   Fatty liver 02/14/2015   Hiatal hernia     Dysphagia, pharyngoesophageal phase     History obtained from: chart review and patient.  Sandy Valencia is a 60 y.o. female presenting for a follow up visit.  She was last seen in May 2022.  At that time, lung testing looked great.  We continued with pantoprazole 40 mg daily.  Also recommended continued avoidance of kiwi and walnuts. We refilled her EpiPen.  We continued with Flonase for postnasal drip and continued with cetirizine 10mg  daily.   Her grandson - Sandy Valencia - ended up going to [employer]. He got a ton of scholarships. Sandy Valencia is a walk on for football and is on the wrestling. He does have scholarships from there for sure, but grandmother wanted him to go farther away. His grades are going well. He is Chemical engineer in [employer], production.   She has been enjoying having the house to her own. She has done some traveling and did some gambling. She has been spending more time on day trips. She seems to have transitioned to an empty nester fairly well.   Asthma/Respiratory Symptom History: She last used her albuterol inhaler a while ago. She has not used a controller medication in years. She remains on the albuterol as needed.   Allergic Rhinitis Symptom History: She is using her fluticasone as well as her cetirizine 10mg  daily. She has not needed antibiotics in quite some time.   Food Allergy Symptom History: She had an accidental reaction to kiwi in February 2022. She laid a cold rag on her fae. She  did not take Benadryl at all. It cleared up quickly without any problems.   Otherwise, there have been no changes to her past medical history, surgical history, family history, or social history.    Review of Systems  Constitutional: Negative.  Negative for chills, fever, malaise/fatigue and weight loss.  HENT: Negative.  Negative for congestion, ear discharge and ear pain.   Eyes:  Negative for pain, discharge and redness.  Respiratory:  Negative for cough, sputum production, shortness of breath and wheezing.    Cardiovascular: Negative.  Negative for chest pain and palpitations.  Gastrointestinal:  Negative for abdominal pain, constipation, diarrhea, heartburn, nausea and vomiting.  Skin: Negative.  Negative for itching and rash.  Neurological:  Negative for dizziness and headaches.  Endo/Heme/Allergies:  Negative for environmental allergies. Does not bruise/bleed easily.      Objective:   Blood pressure 140/72, pulse 75, temperature 98.2 F (36.8 C), temperature source Temporal, resp. rate 18, height 5\' 5"  (1.651 m), weight 247 lb 6.4 oz (112.2 kg), SpO2 96 %. Body mass index is 41.17 kg/m.   Physical Exam:  Physical Exam Constitutional:      Appearance: She is well-developed.     Comments: Extremely talkative.  HENT:     Head: Normocephalic and atraumatic.     Right Ear: Tympanic membrane, ear canal and external ear normal.     Left Ear: Tympanic membrane, ear canal and external ear normal.     Nose: No nasal deformity, septal deviation, mucosal edema or rhinorrhea.     Right Turbinates: Not enlarged, swollen or pale.     Left Turbinates: Not enlarged, swollen or pale.     Right Sinus: No maxillary sinus tenderness or frontal sinus tenderness.     Left Sinus: No maxillary sinus tenderness or frontal sinus tenderness.     Mouth/Throat:     Mouth: Mucous membranes are not pale and not dry.     Pharynx: Uvula midline.  Eyes:     General: Lids are normal. No allergic shiner.       Right eye: No discharge.        Left eye: No discharge.     Conjunctiva/sclera: Conjunctivae normal.     Right eye: Right conjunctiva is not injected. No chemosis.    Left eye: Left conjunctiva is not injected. No chemosis.    Pupils: Pupils are equal, round, and reactive to light.  Cardiovascular:     Rate and Rhythm: Normal rate and regular rhythm.     Heart sounds: Normal heart sounds.  Pulmonary:     Effort: Pulmonary effort is normal. No tachypnea, accessory muscle usage or respiratory distress.      Breath sounds: Normal breath sounds. No wheezing, rhonchi or rales.     Comments: Moving air well in all lung fields. Chest:     Chest wall: No tenderness.  Lymphadenopathy:     Cervical: No cervical adenopathy.  Skin:    Coloration: Skin is not pale.     Findings: No abrasion, erythema, petechiae or rash. Rash is not papular, urticarial or vesicular.  Neurological:     Mental Status: She is alert.  Psychiatric:        Behavior: Behavior is cooperative.     Diagnostic studies:    Spirometry: results normal (FEV1: 2.07/80%, FVC: 2.52/77%, FEV1/FVC: 82%).    Spirometry consistent with normal pattern.   Allergy Studies: none        Salvatore Marvel, MD  [facility]

## 2021-04-12 NOTE — Patient Instructions (Addendum)
1. Mild intermittent asthma, uncomplicated - Lung testing looked stable today. - We are not going to make any changes at this time.  - Continue with albuterol 4 puffs every 4-6 hours as needed.   - There is no need for a controller medication at this time.   2. Gastroesophageal reflux disease - Continue with pantoprazole 40mg  daily.  3. 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 - EpiPen refilled today.    4. Chronic rhinitis - Start fluticasone one spray per nostril daily. - You can also use cetirizine 10mg  daily.   5. Return in about 1 year (around 04/12/2022).   Please inform us of any Emergency Department visits, hospitalizations, or changes in symptoms. Call us before going to the ED for breathing or allergy symptoms since we might be able to fit you in for a sick visit. Feel free to contact us anytime with any questions, problems, or concerns.  It was a pleasure to see you again today!  Websites that have reliable patient information: 1. American Academy of Asthma, Allergy, and Immunology: www.aaaai.org 2. Food Allergy Research and Education (FARE): foodallergy.org 3. Mothers of Asthmatics: http://www.asthmacommunitynetwork.org 4. American College of Allergy, Asthma, and Immunology: www.acaai.org   COVID-19 Vaccine Information can be found at: ShippingScam.co.uk For questions related to vaccine distribution or appointments, please email [email or web address] or call [phone].     "Like" [place] on Facebook and Instagram for our latest updates!      Make sure you are registered to vote! If you have moved or changed any of your contact information, you will need to get this updated before voting!  In some cases, you MAY be able to register to vote online: [email or web address]

## 2021-04-17 ENCOUNTER — Ambulatory Visit: Payer: Medicaid Other | Admitting: Internal Medicine

## 2021-04-17 ENCOUNTER — Encounter: Payer: Self-pay | Admitting: Internal Medicine

## 2021-04-17 ENCOUNTER — Other Ambulatory Visit: Payer: Self-pay

## 2021-04-17 VITALS — BP 136/78 | HR 98 | Temp 99.1°F | Resp 16 | Ht 65.0 in | Wt 247.1 lb

## 2021-04-17 DIAGNOSIS — L821 Other seborrheic keratosis: Secondary | ICD-10-CM

## 2021-04-17 DIAGNOSIS — I10 Essential (primary) hypertension: Secondary | ICD-10-CM

## 2021-04-17 DIAGNOSIS — J309 Allergic rhinitis, unspecified: Secondary | ICD-10-CM | POA: Insufficient documentation

## 2021-04-17 DIAGNOSIS — J3089 Other allergic rhinitis: Secondary | ICD-10-CM

## 2021-04-17 MED ORDER — NOREL AD 4-10-325 MG PO TABS: 1.0000 | ORAL_TABLET | Freq: Three times a day (TID) | ORAL | 0 refills | Status: DC | PRN

## 2021-04-17 NOTE — Assessment & Plan Note (Signed)
Takes Zyrtec 10 mg QD Added Norel for symptomatic relief If persistent or worsening, will start antibiotic

## 2021-04-17 NOTE — Progress Notes (Signed)
Established Patient Office Visit  Subjective:  Patient ID: Sandy Valencia, female    DOB: 02/22/1961  Age: 60 y.o. MRN: [id number]  CC:  Chief Complaint  Patient presents with   Follow-up    2 month follow up HTN pt having congestion that started this morning also pt has mole on back she wants looked at     HPI Sandy Valencia is a 60 y.o. female with past medical history of HTN, DM, hypothyroidism, NAFLD, GERD, chronic pain syndrome and morbid obesity who presents for follow up of HTN.  HTN: BP is well-controlled. Takes medications regularly. Patient denies headache, dizziness, chest pain, dyspnea or palpitations.  She c/o nasal congestion and sore throat with mild headache for last 2 days.  She denies any fever, chills, dyspnea or wheezing currently.  She also reports having a mole over his back area.  Denies any itching or recent change in color or size.   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   Anaphylactic shock, unspecified, subsequent encounter    Anemia, iron deficiency 02/14/2015   Angio-edema    Arthritis    Asthma    Asymptomatic varicose veins of right lower extremity    Chronic obstructive pulmonary disease with (acute) exacerbation (HCC)    Chronic rhinitis 05/21/2018   COVID-19    Diabetes mellitus    Essential (primary) hypertension    Family history of colon cancer 09/03/2012   Fatty liver    Gastro-esophageal reflux disease without esophagitis    GERD (gastroesophageal reflux disease)    History of colonic polyps 08/26/2017   Hypercholesteremia    Hypertension    Hypothyroidism    Low back pain    Mass of finger, right s/p surgical excision (mucoid cyst) 04/17/18 04/22/2018   Mucous cyst of digit of right hand    Obesity, unspecified    Other adverse food reactions, not elsewhere classified, initial encounter    Personal history of noncompliance with medical  treatment, presenting hazards to health 05/18/2015   Rash and other nonspecific skin eruption    Sleep apnea    pt siad, "i had small amount but could not tolerate CPAP and they said it was ok since it was mild.   Sleep apnea, unspecified    Unspecified asthma, uncomplicated    Zoster without complications     Past Surgical History:  Procedure Laterality Date   ABDOMINAL HYSTERECTOMY     CHOLECYSTECTOMY     COLONOSCOPY N/A 09/22/2012   RMR: colonic polyps -removed as described above. tubular adenoma, next TCS 09/2017   COLONOSCOPY WITH PROPOFOL N/A 09/23/2017   Procedure: COLONOSCOPY WITH PROPOFOL;  Surgeon: Daneil Dolin, MD;  Location: [facility];  Service: Endoscopy;  Laterality: N/A;  7:30am   CYST EXCISION Left 07/19/2016   Procedure: CYST REMOVAL LEFT RING FINGER;  Surgeon: Carole Civil, MD;  Location: [facility];  Service: Orthopedics;  Laterality: Left;   CYST REMOVAL LEG Right    foot   ESOPHAGOGASTRODUODENOSCOPY N/A 10/21/2014   RMR: Small hiatal hernia; otherwise normal EGD status post passage of a Maloney dilator.    EXCISION MASS UPPER EXTREMETIES Right 04/17/2018   Procedure: EXCISION MASS UPPER EXTREMETIES right ring finger;  Surgeon: Carole Civil, MD;  Location: [facility];  Service: Orthopedics;  Laterality: Right;   MALONEY DILATION N/A 10/21/2014   Procedure: [name] DILATION;  Surgeon: Herbie Baltimore  Hilton Cork, MD;  Location: [facility];  Service: Endoscopy;  Laterality: N/A;   MIDDLE EAR SURGERY Right    patched hole in ear drum   POLYPECTOMY  09/23/2017   Procedure: POLYPECTOMY;  Surgeon: Daneil Dolin, MD;  Location: [facility];  Service: Endoscopy;;  polyp hepatic flexure polyp cs, splenic flexure polyp cs, rectal polyp cs    Family History  Problem Relation Age of Onset   Colon cancer Mother        diagnosed age 51, underwent surgical resection, metastatic disease several years later   Diabetes Mother    Heart attack Father    Diabetes Sister     Hypothyroidism Brother    Diabetes Sister    Diabetes Sister    Diabetes Sister    Hypothyroidism Sister    Allergic rhinitis Neg Hx    Angioedema Neg Hx    Asthma Neg Hx    Atopy Neg Hx    Eczema Neg Hx    Immunodeficiency Neg Hx    Urticaria Neg Hx     Social History   Socioeconomic History   Marital status: Divorced    Spouse name: Not on file   Number of children: 2   Years of education: Not on file   Highest education level: Not on file  Occupational History   Not on file  Tobacco Use   Smoking status: Never   Smokeless tobacco: Never  Vaping Use   Vaping Use: Never used  Substance and Sexual Activity   Alcohol use: No    Alcohol/week: 0.0 standard drinks   Drug use: No   Sexual activity: Yes    Birth control/protection: Surgical  Other Topics Concern   Not on file  Social History Narrative   Lives with grandson (since born)      Enjoys: vacation, travel, concerts      Diet: eats all food groups    Caffeine: diet Pepsi daily    Water:  8 cups daily       Wears seat belt    Does use phone   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    Social Determinants of Health   Financial Resource Strain: Not on file  Food Insecurity: Not on file  Transportation Needs: Not on file  Physical Activity: Not on file  Stress: Not on file  Social Connections: Not on file  Intimate Partner Violence: Not on file    Outpatient Medications Prior to Visit  Medication Sig Dispense Refill   Accu-Chek FastClix Lancets MISC Use to test 4 times daily 102 each 1   aspirin EC 81 MG tablet Take 81 mg by mouth daily.     cetirizine (ZYRTEC) 10 MG tablet Take 1 tablet (10 mg total) by mouth daily. 30 tablet 3   Cholecalciferol (VITAMIN D-3) 1000 UNITS CAPS Take 1,000 Units by mouth daily.      Diclofenac Sodium 3 % GEL APPLY 2 GRAMS TO AFFECTED AREAS 2 TIMES A DAY AS NEEDED 100 g 0   EPINEPHrine 0.3 mg/0.3 mL IJ SOAJ injection Inject 0.3 mg into the muscle as needed for  anaphylaxis. 2 each 1   fluticasone (FLONASE) 50 MCG/ACT nasal spray Place 1 spray into both nostrils daily. 16 g 3   glipiZIDE (GLUCOTROL XL) 10 MG 24 hr tablet Take 1 tablet (10 mg total) by mouth daily with breakfast. 90 tablet 3   glucose blood (ACCU-CHEK GUIDE) test strip USE TO TEST 4 TIMES  DAILY. 100 strip 0   HYDROcodone-acetaminophen (NORCO) 7.5-325 MG tablet Take by mouth.     insulin aspart (NOVOLOG FLEXPEN) 100 UNIT/ML FlexPen Inject 25-31 Units into the skin 3 (three) times daily with meals. 90 mL 0   ketoconazole (NIZORAL) 2 % cream Apply 1 application topically daily as needed for irritation. 15 g 0   LANTUS SOLOSTAR 100 UNIT/ML Solostar Pen INJECT 80 UNITS SUBCUTANEOUSLY INTO THE SKIN DAILY. 30 mL 2   levothyroxine (SYNTHROID) 75 MCG tablet Take 1 tablet by mouth six days of the week before breakfast 90 tablet 0   lisinopril (ZESTRIL) 20 MG tablet Take 1 tablet (20 mg total) by mouth daily. 30 tablet 1   LITETOUCH PEN NEEDLES 31G X 8 MM MISC USE TO INJECT 4 TIMES DAILY 100 each 0   metFORMIN (GLUCOPHAGE-XR) 500 MG 24 hr tablet TAKE ONE TABLET BY MOUTH TWICE A DAY AFTER A MEAL 180 tablet 0   pantoprazole (PROTONIX) 40 MG tablet TAKE ONE TABLET (40MG TOTAL) BY MOUTH DAILY 30 tablet 4   pregabalin (LYRICA) 150 MG capsule Take 150 mg by mouth 2 (two) times daily.     PROAIR HFA 108 (90 Base) MCG/ACT inhaler INHALE 4 PUFFS INTO THE LUNGS EVERY 6 HOURS AS NEEDED FOR WHEEZING OR SHORTNESS OF BREATH 8.5 g 1   triamcinolone cream (KENALOG) 0.1 % Apply 1 application topically 2 (two) times daily. 30 g 0   vitamin B-12 (CYANOCOBALAMIN) 500 MCG tablet Take 500 mcg by mouth daily.     diclofenac Sodium (VOLTAREN) 1 % GEL Apply 2 g topically 2 (two) times daily as needed. 50 g 0   No facility-administered medications prior to visit.    Allergies  Allergen Reactions   Kiwi Extract Anaphylaxis, Swelling and Palpitations   Other Anaphylaxis    Walnuts (avoids all tree nuts)    ROS Review  of Systems  Constitutional:  Negative for chills and fever.  HENT:  Negative for congestion, sinus pressure, sinus pain and sore throat.   Eyes:  Negative for pain and discharge.  Respiratory:  Negative for cough and shortness of breath.   Cardiovascular:  Negative for chest pain and palpitations.  Gastrointestinal:  Negative for abdominal pain, diarrhea, nausea and vomiting.  Endocrine: Negative for polydipsia and polyuria.  Genitourinary:  Negative for dysuria and hematuria.  Musculoskeletal:  Positive for arthralgias and back pain. Negative for neck pain and neck stiffness.  Skin:  Positive for color change (Mole over back).  Neurological:  Negative for dizziness and weakness.  Psychiatric/Behavioral:  Negative for agitation and behavioral problems.      Objective:    Physical Exam Vitals reviewed.  Constitutional:      General: She is not in acute distress.    Appearance: She is obese. She is not diaphoretic.  HENT:     Head: Normocephalic and atraumatic.     Nose: Nose normal. No congestion.     Mouth/Throat:     Mouth: Mucous membranes are moist.     Pharynx: No posterior oropharyngeal erythema.  Eyes:     General: No scleral icterus.    Extraocular Movements: Extraocular movements intact.  Cardiovascular:     Rate and Rhythm: Normal rate and regular rhythm.     Pulses: Normal pulses.     Heart sounds: Normal heart sounds. No murmur heard. Pulmonary:     Breath sounds: Normal breath sounds. No wheezing or rales.  Abdominal:     Palpations: Abdomen is soft.  Tenderness: There is no abdominal tenderness.  Musculoskeletal:     Cervical back: Neck supple. No tenderness.     Right lower leg: No edema.     Left lower leg: No edema.  Skin:    General: Skin is warm.     Findings: Lesion (Brownish mole, circular, about 1 cm in diameter, regular margins) present.  Neurological:     General: No focal deficit present.     Mental Status: She is alert and oriented to  person, place, and time.  Psychiatric:        Mood and Affect: Mood normal.        Behavior: Behavior normal.    BP 136/78 (BP Location: Right Arm, Patient Position: Sitting, Cuff Size: Normal)   Pulse 98   Temp 99.1 F (37.3 C) (Oral)   Resp 16   Ht '5\' 5"'  (1.651 m)   Wt 247 lb 1.9 oz (112.1 kg)   SpO2 98%   BMI 41.12 kg/m  Wt Readings from Last 3 Encounters:  04/17/21 247 lb 1.9 oz (112.1 kg)  04/12/21 247 lb 6.4 oz (112.2 kg)  02/13/21 245 lb 0.6 oz (111.1 kg)    Lab Results  Component Value Date   TSH 1.870 01/09/2021   Lab Results  Component Value Date   WBC 4.8 12/07/2020   HGB 12.9 12/07/2020   HCT 39.8 12/07/2020   MCV 85 12/07/2020   PLT 236 12/07/2020   Lab Results  Component Value Date   NA 142 01/09/2021   K 4.5 01/09/2021   CO2 24 01/09/2021   GLUCOSE 135 (H) 01/09/2021   BUN 11 01/09/2021   CREATININE 0.67 01/09/2021   BILITOT 0.3 01/09/2021   ALKPHOS 93 01/09/2021   AST 37 01/09/2021   ALT 50 (H) 01/09/2021   PROT 6.8 01/09/2021   ALBUMIN 4.3 01/09/2021   CALCIUM 9.7 01/09/2021   ANIONGAP 11 04/22/2018   EGFR 101 01/09/2021   Lab Results  Component Value Date   CHOL 151 01/09/2021   Lab Results  Component Value Date   HDL 50 01/09/2021   Lab Results  Component Value Date   LDLCALC 84 01/09/2021   Lab Results  Component Value Date   TRIG 89 01/09/2021   Lab Results  Component Value Date   CHOLHDL 3.0 01/09/2021   Lab Results  Component Value Date   HGBA1C 9.0 (A) 01/17/2021      Assessment & Plan:   Problem List Items Addressed This Visit       Cardiovascular and Mediastinum   Essential hypertension - Primary    BP Readings from Last 1 Encounters:  04/17/21 136/78  Well-controlled with Lisinopril 20 mg QD now Counseled for compliance with the medications Advised DASH diet and moderate exercise/walking, at least 150 mins/week         Respiratory   Allergic rhinitis due to allergen    Takes Zyrtec 10 mg  QD Added Norel for symptomatic relief If persistent or worsening, will start antibiotic      Relevant Medications   Chlorphen-PE-Acetaminophen (NOREL AD) 4-10-325 MG TABS     Musculoskeletal and Integument   Seborrheic keratosis    Mole over back appears to be benign If change in color or size, will refer to dermatology for skin biopsy       Meds ordered this encounter  Medications   Chlorphen-PE-Acetaminophen (NOREL AD) 4-10-325 MG TABS    Sig: Take 1 tablet by mouth 3 (three) times daily as needed.  Dispense:  30 tablet    Refill:  0    Follow-up: Return in about 4 months (around 08/15/2021).    Lindell Spar, MD

## 2021-04-17 NOTE — Patient Instructions (Signed)
Please continue to take medications as prescribed.  Please start taking Norel as prescribed for nasal congestion. Please use Flonase for allergies.

## 2021-04-17 NOTE — Assessment & Plan Note (Signed)
BP Readings from Last 1 Encounters:  04/17/21 136/78   Well-controlled with Lisinopril 20 mg QD now Counseled for compliance with the medications Advised DASH diet and moderate exercise/walking, at least 150 mins/week

## 2021-04-17 NOTE — Assessment & Plan Note (Signed)
Mole over back appears to be benign If change in color or size, will refer to dermatology for skin biopsy

## 2021-04-20 ENCOUNTER — Other Ambulatory Visit: Payer: Self-pay | Admitting: Nurse Practitioner

## 2021-04-20 ENCOUNTER — Other Ambulatory Visit: Payer: Self-pay | Admitting: Internal Medicine

## 2021-04-20 DIAGNOSIS — I10 Essential (primary) hypertension: Secondary | ICD-10-CM

## 2021-04-25 ENCOUNTER — Other Ambulatory Visit: Payer: Self-pay

## 2021-04-25 ENCOUNTER — Encounter: Payer: Self-pay | Admitting: Nurse Practitioner

## 2021-04-25 ENCOUNTER — Ambulatory Visit: Payer: Medicaid Other | Admitting: Nurse Practitioner

## 2021-04-25 VITALS — BP 169/72 | HR 71 | Ht 65.0 in | Wt 240.2 lb

## 2021-04-25 DIAGNOSIS — E559 Vitamin D deficiency, unspecified: Secondary | ICD-10-CM

## 2021-04-25 DIAGNOSIS — I10 Essential (primary) hypertension: Secondary | ICD-10-CM

## 2021-04-25 DIAGNOSIS — E782 Mixed hyperlipidemia: Secondary | ICD-10-CM | POA: Diagnosis not present

## 2021-04-25 DIAGNOSIS — E039 Hypothyroidism, unspecified: Secondary | ICD-10-CM | POA: Diagnosis not present

## 2021-04-25 DIAGNOSIS — Z794 Long term (current) use of insulin: Secondary | ICD-10-CM | POA: Diagnosis not present

## 2021-04-25 DIAGNOSIS — E1165 Type 2 diabetes mellitus with hyperglycemia: Secondary | ICD-10-CM

## 2021-04-25 LAB — POCT GLYCOSYLATED HEMOGLOBIN (HGB A1C): HbA1c, POC (controlled diabetic range): 9.3 % — AB (ref 0.0–7.0)

## 2021-04-25 MED ORDER — DEXCOM G6 TRANSMITTER MISC: 3 refills | Status: DC

## 2021-04-25 MED ORDER — DEXCOM G6 SENSOR MISC: 3 refills | Status: DC

## 2021-04-25 NOTE — Patient Instructions (Signed)

## 2021-04-25 NOTE — Progress Notes (Signed)
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   Anaphylactic shock, unspecified, subsequent encounter    Anemia, iron deficiency 02/14/2015   Angio-edema    Arthritis    Asthma    Asymptomatic varicose veins of right lower extremity    Chronic obstructive pulmonary disease with (acute) exacerbation (HCC)    Chronic rhinitis 05/21/2018   COVID-19    Diabetes mellitus    Essential (primary) hypertension    Family history of colon cancer 09/03/2012   Fatty liver    Gastro-esophageal reflux disease without esophagitis    GERD (gastroesophageal reflux disease)    History of colonic polyps 08/26/2017   Hypercholesteremia    Hypertension    Hypothyroidism    Low back pain    Mass of finger, right s/p surgical excision (mucoid cyst) 04/17/18 04/22/2018   Mucous cyst of digit of right hand    Obesity, unspecified    Other adverse food reactions, not elsewhere classified, initial encounter    Personal history of noncompliance with medical treatment, presenting hazards to health 05/18/2015   Rash and other nonspecific skin eruption    Sleep apnea    pt siad, "i had small amount but could not tolerate CPAP and they said it was ok since it was mild.   Sleep apnea, unspecified    Unspecified asthma, uncomplicated    Zoster without complications    Past Surgical History:  Procedure Laterality Date   ABDOMINAL HYSTERECTOMY     CHOLECYSTECTOMY     COLONOSCOPY N/A 09/22/2012   RMR: colonic polyps -removed as described above. tubular adenoma, next TCS 09/2017   COLONOSCOPY WITH PROPOFOL N/A 09/23/2017   Procedure: COLONOSCOPY WITH PROPOFOL;  Surgeon: Daneil Dolin, MD;  Location: [facility];  Service: Endoscopy;  Laterality: N/A;  7:30am   CYST EXCISION Left  07/19/2016   Procedure: CYST REMOVAL LEFT RING FINGER;  Surgeon: Carole Civil, MD;  Location: [facility];  Service: Orthopedics;  Laterality: Left;   CYST REMOVAL LEG Right    foot   ESOPHAGOGASTRODUODENOSCOPY N/A 10/21/2014   RMR: Small hiatal hernia; otherwise normal EGD status post passage of a Maloney dilator.    EXCISION MASS UPPER EXTREMETIES Right 04/17/2018   Procedure: EXCISION MASS UPPER EXTREMETIES right ring finger;  Surgeon: Carole Civil, MD;  Location: [facility];  Service: Orthopedics;  Laterality: Right;   MALONEY DILATION N/A 10/21/2014   Procedure: [name] DILATION;  Surgeon: Daneil Dolin, MD;  Location: [facility];  Service: Endoscopy;  Laterality: N/A;   MIDDLE EAR SURGERY Right    patched hole in ear drum   POLYPECTOMY  09/23/2017   Procedure: POLYPECTOMY;  Surgeon: Daneil Dolin, MD;  Location: [facility];  Service: Endoscopy;;  polyp hepatic flexure polyp cs, splenic flexure polyp cs, rectal polyp cs   Social History   Socioeconomic History   Marital status: Divorced    Spouse name: Not on file   Number of children: 2   Years of education: Not on file   Highest education level: Not on file  Occupational History   Not on file  Tobacco Use   Smoking status: Never   Smokeless tobacco: Never  Vaping Use   Vaping Use:  Never used  Substance and Sexual Activity   Alcohol use: No    Alcohol/week: 0.0 standard drinks   Drug use: No   Sexual activity: Yes    Birth control/protection: Surgical  Other Topics Concern   Not on file  Social History Narrative   Lives with grandson (since born)      Enjoys: vacation, travel, concerts      Diet: eats all food groups    Caffeine: diet Pepsi daily    Water:  8 cups daily       Wears seat belt    Does use phone   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    Social Determinants of Health   Financial Resource Strain: Not on file  Food Insecurity: Not on file  Transportation Needs: Not on file   Physical Activity: Not on file  Stress: Not on file  Social Connections: Not on file   Outpatient Encounter Medications as of 04/25/2021  Medication Sig   Accu-Chek FastClix Lancets MISC Use to test 4 times daily   aspirin EC 81 MG tablet Take 81 mg by mouth daily.   cetirizine (ZYRTEC) 10 MG tablet Take 1 tablet (10 mg total) by mouth daily.   Cholecalciferol (VITAMIN D-3) 1000 UNITS CAPS Take 1,000 Units by mouth daily.    Continuous Blood Gluc Sensor (DEXCOM G6 SENSOR) MISC Change sensor every 10 days as directed   Continuous Blood Gluc Transmit (DEXCOM G6 TRANSMITTER) MISC Change transmitter every 90 days as directed.   Diclofenac Sodium 3 % GEL APPLY 2 GRAMS TO AFFECTED AREAS 2 TIMES A DAY AS NEEDED   EPINEPHrine 0.3 mg/0.3 mL IJ SOAJ injection Inject 0.3 mg into the muscle as needed for anaphylaxis.   fluticasone (FLONASE) 50 MCG/ACT nasal spray Place 1 spray into both nostrils daily.   glipiZIDE (GLUCOTROL XL) 10 MG 24 hr tablet TAKE ONE TABLET (10MG  TOTAL) BY MOUTH DAILY WITH BREAKFAST   glucose blood (ACCU-CHEK GUIDE) test strip USE TO TEST 4 TIMES DAILY.   HYDROcodone-acetaminophen (NORCO) 7.5-325 MG tablet Take by mouth.   ketoconazole (NIZORAL) 2 % cream Apply 1 application topically daily as needed for irritation.   LANTUS SOLOSTAR 100 UNIT/ML Solostar Pen INJECT 80 UNITS SUBCUTANEOUSLY INTO THE SKIN DAILY.   levothyroxine (SYNTHROID) 75 MCG tablet TAKE ONE (1) TABLET BY MOUTH EVERY DAY BEFORE BREAKFAST   lisinopril (ZESTRIL) 20 MG tablet TAKE ONE TABLET (20MG  TOTAL) BY MOUTH DAILY   LITETOUCH PEN NEEDLES 31G X 8 MM MISC USE TO INJECT 4 TIMES DAILY   metFORMIN (GLUCOPHAGE-XR) 500 MG 24 hr tablet TAKE ONE TABLET BY MOUTH TWICE A DAY AFTER A MEAL   NOVOLOG FLEXPEN 100 UNIT/ML FlexPen INJECT 25 UNITS TO 31 UNITS INTO THE SKIN THREE TIMES DAILY WITH MEALS.   pantoprazole (PROTONIX) 40 MG tablet TAKE ONE TABLET (40MG  TOTAL) BY MOUTH DAILY   pregabalin (LYRICA) 150 MG capsule Take  150 mg by mouth 2 (two) times daily.   PROAIR HFA 108 (90 Base) MCG/ACT inhaler INHALE 4 PUFFS INTO THE LUNGS EVERY 6 HOURS AS NEEDED FOR WHEEZING OR SHORTNESS OF BREATH   triamcinolone cream (KENALOG) 0.1 % Apply 1 application topically 2 (two) times daily.   vitamin B-12 (CYANOCOBALAMIN) 500 MCG tablet Take 500 mcg by mouth daily.   [DISCONTINUED] Chlorphen-PE-Acetaminophen (NOREL AD) 4-10-325 MG TABS Take 1 tablet by mouth 3 (three) times daily as needed. (Patient not taking: Reported on 04/25/2021)   No facility-administered encounter medications on file  as of 04/25/2021.   ALLERGIES: Allergies  Allergen Reactions   Kiwi Extract Anaphylaxis, Swelling and Palpitations   Other Anaphylaxis    Walnuts (avoids all tree nuts)   VACCINATION STATUS: Immunization History  Administered Date(s) Administered   Influenza,inj,Quad PF,6+ Mos 04/15/2017, 03/30/2019   Influenza-Unspecified 04/17/2011, 03/24/2012, 05/12/2013, 02/23/2015, 04/04/2017, 04/08/2018   Moderna Sars-Covid-2 Vaccination 10/22/2019, 11/19/2019    Diabetes She presents for her follow-up diabetic visit. She has type 2 diabetes mellitus. Onset time: She was diagnosed at approximate age of 63 years. Her disease course has been stable. There are no hypoglycemic associated symptoms. Pertinent negatives for hypoglycemia include no confusion, nervousness/anxiousness, pallor, seizures or tremors. Associated symptoms include polydipsia, polyuria and weight loss. Pertinent negatives for diabetes include no fatigue and no polyphagia. There are no hypoglycemic complications. Symptoms are stable. There are no diabetic complications. Risk factors for coronary artery disease include dyslipidemia, diabetes mellitus, hypertension, sedentary lifestyle, obesity, post-menopausal and stress. Current diabetic treatment includes oral agent (dual therapy) and intensive insulin program. She is compliant with treatment most of the time. Her weight is  fluctuating minimally. She is following a generally unhealthy diet. When asked about meal planning, she reported none. She has had a previous visit with a dietitian. She never participates in exercise. Her home blood glucose trend is fluctuating minimally. Her overall blood glucose range is >200 mg/dl. (She presents today with her meter and logs showing inconsistent glucose monitoring pattern.  Has not been checking glucose 4 times daily due to the pain it causes.  Her POCT A1c today is 9.3%, increasing from last visit of 9%.  Analysis of her meter shows 7-day average of 200 with 14 readings; 14-day average of 196, 30-day average of 212, 90-day average of 217.  She has worked hard and lost 8 lbs since last visit.  She denies any hypoglycemia.) An ACE inhibitor/angiotensin II receptor blocker is being taken. She sees a podiatrist.Eye exam is current.  Hyperlipidemia This is a chronic problem. The current episode started more than 1 year ago. The problem is controlled. Recent lipid tests were reviewed and are normal. Exacerbating diseases include diabetes, hypothyroidism and obesity. There are no known factors aggravating her hyperlipidemia. Pertinent negatives include no myalgias. Current antihyperlipidemic treatment includes statins. The current treatment provides significant improvement of lipids. Compliance problems include medication cost, adherence to diet and adherence to exercise.  Risk factors for coronary artery disease include diabetes mellitus, dyslipidemia, hypertension, obesity, a sedentary lifestyle and post-menopausal.  Hypertension This is a chronic problem. The current episode started more than 1 year ago. The problem has been waxing and waning since onset. The problem is uncontrolled. Pertinent negatives include no palpitations. Agents associated with hypertension include thyroid hormones. Risk factors for coronary artery disease include diabetes mellitus, dyslipidemia, obesity, post-menopausal  state, sedentary lifestyle and stress. Past treatments include ACE inhibitors. The current treatment provides mild improvement. Compliance problems include diet and exercise.  Identifiable causes of hypertension include sleep apnea and a thyroid problem.  Thyroid Problem Presents for follow-up visit. Symptoms include weight loss. Patient reports no anxiety, cold intolerance, constipation, depressed mood, diarrhea, dry skin, fatigue, hair loss, heat intolerance, palpitations, tremors or weight gain. The symptoms have been stable. Her past medical history is significant for diabetes and hyperlipidemia.    Review of systems  Constitutional: + steadily decreasing body weight,  current Body mass index is 39.97 kg/m. , no fatigue, no subjective hyperthermia, no subjective hypothermia Eyes: no blurry vision, no xerophthalmia ENT: no sore  throat, no nodules palpated in throat, no dysphagia/odynophagia, no hoarseness Cardiovascular: no chest pain, no shortness of breath, no palpitations, no leg swelling Respiratory: no cough, no shortness of breath Gastrointestinal: no nausea/vomiting/diarrhea Musculoskeletal: no muscle/joint aches Skin: no rashes, no hyperemia Neurological: no tremors, no numbness, no tingling, no dizziness Psychiatric: no depression, no anxiety   Objective:    BP (!) 169/72   Pulse 71   Ht 5\' 5"  (1.651 m)   Wt 240 lb 3.2 oz (109 kg)   BMI 39.97 kg/m   Wt Readings from Last 3 Encounters:  04/25/21 240 lb 3.2 oz (109 kg)  04/17/21 247 lb 1.9 oz (112.1 kg)  04/12/21 247 lb 6.4 oz (112.2 kg)    BP Readings from Last 3 Encounters:  04/25/21 (!) 169/72  04/17/21 136/78  04/12/21 140/72     Physical Exam- Limited  Constitutional:  Body mass index is 39.97 kg/m. , not in acute distress, slightly anxious state of mind Eyes:  EOMI, no exophthalmos Neck: Supple Cardiovascular: RRR, no murmurs, rubs, or gallops, no edema Respiratory: Adequate breathing efforts, no  crackles, rales, rhonchi, or wheezing Musculoskeletal: no gross deformities, strength intact in all four extremities, no gross restriction of joint movements Skin:  no rashes, no hyperemia Neurological: no tremor with outstretched hands    Lipid Panel     Component Value Date/Time   CHOL 151 01/09/2021 0828   TRIG 89 01/09/2021 0828   HDL 50 01/09/2021 0828   CHOLHDL 3.0 01/09/2021 0828   CHOLHDL 2.4 12/25/2019 0709   VLDL 10 03/07/2016 0705   LDLCALC 84 01/09/2021 0828   LDLCALC 65 12/25/2019 0709    Recent Results (from the past 2160 hour(s))  POCT glycosylated hemoglobin (Hb A1C)     Status: Abnormal   Collection Time: 04/25/21  9:27 AM  Result Value Ref Range   Hemoglobin A1C     HbA1c POC (<> result, manual entry)     HbA1c, POC (prediabetic range)     HbA1c, POC (controlled diabetic range) 9.3 (A) 0.0 - 7.0 %      Assessment & Plan:   1) Uncontrolled type 2 diabetes mellitus with complication, with long-term current use of insulin (HCC)  She presents today with her meter and logs showing inconsistent glucose monitoring pattern.  Has not been checking glucose 4 times daily due to the pain it causes.  Her POCT A1c today is 9.3%, increasing from last visit of 9%.  Analysis of her meter shows 7-day average of 200 with 14 readings; 14-day average of 196, 30-day average of 212, 90-day average of 217.  She has worked hard and lost 8 lbs since last visit.  She denies any hypoglycemia.  -Her diabetes is complicated by history of noncompliance (she recently showed better engagement) and patient remains at extremely high risk for more acute and chronic complications of diabetes which include CAD, CVA, CKD, retinopathy, and neuropathy. These are all discussed in detail with the patient.  - Nutritional counseling repeated at each appointment due to patients tendency to fall back in to old habits.  - The patient admits there is a room for improvement in their diet and drink  choices. -  Suggestion is made for the patient to avoid simple carbohydrates from their diet including Cakes, Sweet Desserts / Pastries, Ice Cream, Soda (diet and regular), Sweet Tea, Candies, Chips, Cookies, Sweet Pastries, Store Bought Juices, Alcohol in Excess of 1-2 drinks a day, Artificial Sweeteners, Coffee Creamer, and "Sugar-free" Products. This will  help patient to have stable blood glucose profile and potentially avoid unintended weight gain.   - I encouraged the patient to switch to unprocessed or minimally processed complex starch and increased protein intake (animal or plant source), fruits, and vegetables.   - Patient is advised to stick to a routine mealtimes to eat 3 meals a day and avoid unnecessary snacks (to snack only to correct hypoglycemia).  - I have approached patient with the following individualized plan to manage diabetes and patient agrees.  -She will continue to require intensive treatment with  basal/bolus insulin in order for her to achieve and maintain control of diabetes to target.  -I have ordered her a CGM device to help with compliance of checking blood glucose and giving her better opportunities to inject her short-acting insulin.  No changes will be made to her medication regimen today. She is advised to continue Lantus 80 units SQ nightly, Novolog 25-31 units TID with meals if glucose is above 90 and she is eating (Specific instructions on how to titrate insulin dosage based on glucose readings given to patient in writing), Metformin 500 mg ER BID with meals and Glipizide 10 mg XL daily with breakfast.    -Insurance did not provide optimal coverage for incretin therapy like Ozempic.  -She is encouraged to continue monitoring blood glucose 4 times daily, before meals and before bed, and to call the clinic if she has readings less than 70 or greater than 300 for 3 tests in a row.  She would greatly benefit from CGM device.  Sent Dexcom order to local pharmacy for  fulfillment.  I assisted patient with application of sample sensor from the office today.  I recommend she come back in 10 days for nurse visit to help her switch sensors.  2) Lipids/HPL:  Her most recent lipid panel from 01/09/21 shows controlled LDL of 84.  She was taken off her Statin due to elevated liver enzymes.  Side effects and precautions discussed with her.    3) Hypertension:  Her blood pressure is not controlled to target.  She is advised to continue Lisinopril 20 mg po daily (managed and recently increased by her PCP).  4) Hypothyroidism:  -She does not have recent TFTs to review.  She is advised to continue her current dose of Levothyroxine 75 mcg po daily before breakfast (skipping 2 days out of the week).    - We discussed about the correct intake of her thyroid hormone, on empty stomach at fasting, with water, separated by at least 30 minutes from breakfast and other medications,  and separated by more than 4 hours from calcium, iron, multivitamins, acid reflux medications (PPIs). -Patient is made aware of the fact that thyroid hormone replacement is needed for life, dose to be adjusted by periodic monitoring of thyroid function tests.    - I advised patient to maintain close follow up with Lindell Spar, MD for primary care needs.     I spent 60 minutes in the care of the patient today including review of labs from [place], Lipids, Thyroid Function, Hematology (current and previous including abstractions from other facilities); face-to-face time discussing  her blood glucose readings/logs, discussing hypoglycemia and hyperglycemia episodes and symptoms, medications doses, her options of short and long term treatment based on the latest standards of care / guidelines;  discussion about incorporating lifestyle medicine;  and documenting the encounter.    Please refer to Patient Instructions for Blood Glucose Monitoring and Insulin/Medications Dosing Guide"  in media tab for  additional information. Please  also refer to " Patient Self Inventory" in the Media  tab for reviewed elements of pertinent patient history.  Gillian Scarce participated in the discussions, expressed understanding, and voiced agreement with the above plans.  All questions were answered to her satisfaction. she is encouraged to contact clinic should she have any questions or concerns prior to her return visit.    Follow up plan: -Return in about 3 months (around 07/26/2021) for Diabetes F/U with A1c in office, Previsit labs, Thyroid follow up, Bring meter and logs.  Rayetta Pigg, [facility] [address] [place], [place] [address] Phone: [phone] Fax: [phone]   04/25/2021, 9:55 AM

## 2021-04-26 ENCOUNTER — Telehealth: Payer: Self-pay

## 2021-04-26 NOTE — Telephone Encounter (Signed)
Dexcom G6 Sensors have been approved for 04/26/2021 through 10/23/2021 per [facility] pharmacy. Member ID [id number]

## 2021-04-26 NOTE — Telephone Encounter (Signed)
Prior authorization request started and sent for Dexcom G6 sensors. Tried to send prior auth request for [facility] G6 transmitter but it is available without authorization per Covermymeds.

## 2021-05-05 ENCOUNTER — Ambulatory Visit: Payer: Medicaid Other

## 2021-05-05 ENCOUNTER — Other Ambulatory Visit: Payer: Self-pay

## 2021-05-09 ENCOUNTER — Other Ambulatory Visit: Payer: Self-pay | Admitting: Internal Medicine

## 2021-05-09 ENCOUNTER — Other Ambulatory Visit: Payer: Self-pay | Admitting: Family Medicine

## 2021-05-16 DIAGNOSIS — M542 Cervicalgia: Secondary | ICD-10-CM | POA: Diagnosis not present

## 2021-05-16 DIAGNOSIS — M545 Low back pain, unspecified: Secondary | ICD-10-CM | POA: Diagnosis not present

## 2021-05-16 DIAGNOSIS — M79606 Pain in leg, unspecified: Secondary | ICD-10-CM | POA: Diagnosis not present

## 2021-05-16 DIAGNOSIS — Z79891 Long term (current) use of opiate analgesic: Secondary | ICD-10-CM | POA: Diagnosis not present

## 2021-05-16 DIAGNOSIS — I10 Essential (primary) hypertension: Secondary | ICD-10-CM | POA: Diagnosis not present

## 2021-05-17 ENCOUNTER — Other Ambulatory Visit: Payer: Self-pay | Admitting: Nurse Practitioner

## 2021-05-17 ENCOUNTER — Other Ambulatory Visit: Payer: Self-pay | Admitting: Allergy & Immunology

## 2021-05-17 ENCOUNTER — Other Ambulatory Visit: Payer: Self-pay | Admitting: *Deleted

## 2021-05-17 MED ORDER — ACCU-CHEK FASTCLIX LANCETS MISC: 0 refills | Status: DC

## 2021-05-22 ENCOUNTER — Other Ambulatory Visit: Payer: Self-pay

## 2021-05-22 ENCOUNTER — Ambulatory Visit: Payer: Medicaid Other | Admitting: Nurse Practitioner

## 2021-05-22 ENCOUNTER — Encounter: Payer: Self-pay | Admitting: Nurse Practitioner

## 2021-05-22 DIAGNOSIS — J069 Acute upper respiratory infection, unspecified: Secondary | ICD-10-CM

## 2021-05-22 LAB — POCT INFLUENZA A/B
Influenza A, POC: NEGATIVE
Influenza B, POC: NEGATIVE

## 2021-05-22 LAB — POCT RAPID STREP A (OFFICE): Rapid Strep A Screen: NEGATIVE

## 2021-05-22 NOTE — Assessment & Plan Note (Addendum)
-  sore throat and left ear pain started yesterday -mild symptoms -will check for flu, strep, and covid -if all are negative, offered symptomatic treatment, but she states that she doesn't think OTC meds will help her

## 2021-05-22 NOTE — Progress Notes (Signed)
Flu is negative.

## 2021-05-22 NOTE — Progress Notes (Signed)
Acute Office Visit  Subjective:    Patient ID: Sandy Valencia, female    DOB: 03/07/1961, 60 y.o.   MRN: [id number]  Chief Complaint  Patient presents with   Sore Throat    Sore throat, L ear pain x 2 days     Sore Throat  Associated symptoms include ear pain. Pertinent negatives include no congestion, coughing or shortness of breath.  Patient is in today for sick visit. Symptoms started yesterday. She tried drinking hot chocolate and coffee, but that didn't help. She has left ear fullness. She tried cough drops, but no other OTC meds.  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   Anaphylactic shock, unspecified, subsequent encounter    Anemia, iron deficiency 02/14/2015   Angio-edema    Arthritis    Asthma    Asymptomatic varicose veins of right lower extremity    Chronic obstructive pulmonary disease with (acute) exacerbation (HCC)    Chronic rhinitis 05/21/2018   COVID-19    Diabetes mellitus    Essential (primary) hypertension    Family history of colon cancer 09/03/2012   Fatty liver    Gastro-esophageal reflux disease without esophagitis    GERD (gastroesophageal reflux disease)    History of colonic polyps 08/26/2017   Hypercholesteremia    Hypertension    Hypothyroidism    Low back pain    Mass of finger, right s/p surgical excision (mucoid cyst) 04/17/18 04/22/2018   Mucous cyst of digit of right hand    Obesity, unspecified    Other adverse food reactions, not elsewhere classified, initial encounter    Personal history of noncompliance with medical treatment, presenting hazards to health 05/18/2015   Rash and other nonspecific skin eruption    Sleep apnea    pt siad, "i had small amount but could not tolerate CPAP and they said it was ok since it was mild.   Sleep apnea, unspecified    Unspecified asthma, uncomplicated    Zoster without complications     Past  Surgical History:  Procedure Laterality Date   ABDOMINAL HYSTERECTOMY     CHOLECYSTECTOMY     COLONOSCOPY N/A 09/22/2012   RMR: colonic polyps -removed as described above. tubular adenoma, next TCS 09/2017   COLONOSCOPY WITH PROPOFOL N/A 09/23/2017   Procedure: COLONOSCOPY WITH PROPOFOL;  Surgeon: Daneil Dolin, MD;  Location: [facility];  Service: Endoscopy;  Laterality: N/A;  7:30am   CYST EXCISION Left 07/19/2016   Procedure: CYST REMOVAL LEFT RING FINGER;  Surgeon: Carole Civil, MD;  Location: [facility];  Service: Orthopedics;  Laterality: Left;   CYST REMOVAL LEG Right    foot   ESOPHAGOGASTRODUODENOSCOPY N/A 10/21/2014   RMR: Small hiatal hernia; otherwise normal EGD status post passage of a Maloney dilator.    EXCISION MASS UPPER EXTREMETIES Right 04/17/2018   Procedure: EXCISION MASS UPPER EXTREMETIES right ring finger;  Surgeon: Carole Civil, MD;  Location: [facility];  Service: Orthopedics;  Laterality: Right;   MALONEY DILATION N/A 10/21/2014   Procedure: [name] DILATION;  Surgeon: Daneil Dolin, MD;  Location: [facility];  Service: Endoscopy;  Laterality: N/A;   MIDDLE EAR SURGERY Right    patched hole in ear drum   POLYPECTOMY  09/23/2017   Procedure: POLYPECTOMY;  Surgeon: Daneil Dolin, MD;  Location: [facility];  Service: Endoscopy;;  polyp hepatic flexure polyp cs,  splenic flexure polyp cs, rectal polyp cs    Family History  Problem Relation Age of Onset   Colon cancer Mother        diagnosed age 96, underwent surgical resection, metastatic disease several years later   Diabetes Mother    Heart attack Father    Diabetes Sister    Hypothyroidism Brother    Diabetes Sister    Diabetes Sister    Diabetes Sister    Hypothyroidism Sister    Allergic rhinitis Neg Hx    Angioedema Neg Hx    Asthma Neg Hx    Atopy Neg Hx    Eczema Neg Hx    Immunodeficiency Neg Hx    Urticaria Neg Hx     Social History   Socioeconomic History   Marital status:  Divorced    Spouse name: Not on file   Number of children: 2   Years of education: Not on file   Highest education level: Not on file  Occupational History   Not on file  Tobacco Use   Smoking status: Never   Smokeless tobacco: Never  Vaping Use   Vaping Use: Never used  Substance and Sexual Activity   Alcohol use: No    Alcohol/week: 0.0 standard drinks   Drug use: No   Sexual activity: Yes    Birth control/protection: Surgical  Other Topics Concern   Not on file  Social History Narrative   Lives with grandson (since born)      Enjoys: vacation, travel, concerts      Diet: eats all food groups    Caffeine: diet Pepsi daily    Water:  8 cups daily       Wears seat belt    Does use phone   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 102 each 0   ACCU-CHEK GUIDE test strip USE TO TEST 4 TIMES DAILY. 100 strip 2   aspirin EC 81 MG tablet Take 81 mg by mouth daily.     cetirizine (ZYRTEC) 10 MG tablet TAKE ONE TABLET (10MG TOTAL) BY MOUTH DAILY 30 tablet 3   Cholecalciferol (VITAMIN D-3) 1000 UNITS CAPS Take 1,000 Units by mouth daily.      Continuous Blood Gluc Sensor (DEXCOM G6 SENSOR) MISC Change sensor every 10 days as directed 9 each 3   Continuous Blood Gluc Transmit (DEXCOM G6 TRANSMITTER) MISC Change transmitter every 90 days as directed. 1 each 3   Diclofenac Sodium 3 % GEL APPLY 2 GRAMS TO AFFECTED AREAS 2 TIMES A DAY AS NEEDED 100 g 0   EPINEPHrine 0.3 mg/0.3 mL IJ SOAJ injection Inject 0.3 mg into the muscle as needed for anaphylaxis. 2 each 1   fluticasone  (FLONASE) 50 MCG/ACT nasal spray Place 1 spray into both nostrils daily. 16 g 3   glipiZIDE (GLUCOTROL XL) 10 MG 24 hr tablet TAKE ONE TABLET (10MG TOTAL) BY MOUTH DAILY WITH BREAKFAST 90 tablet 0   HYDROcodone-acetaminophen (NORCO) 7.5-325 MG tablet Take by mouth.     ketoconazole (NIZORAL) 2 %  cream Apply 1 application topically daily as needed for irritation. 15 g 0   LANTUS SOLOSTAR 100 UNIT/ML Solostar Pen INJECT 80 UNITS SUBCUTANEOUSLY INTO THE SKIN DAILY. 30 mL 2   levothyroxine (SYNTHROID) 75 MCG tablet TAKE ONE (1) TABLET BY MOUTH EVERY DAY BEFORE BREAKFAST 90 tablet 0   lisinopril (ZESTRIL) 20 MG tablet TAKE ONE TABLET (20MG TOTAL) BY MOUTH DAILY 30 tablet 1   LITETOUCH PEN NEEDLES 31G X 8 MM MISC USE AS DIRECTED FOUR TIMES DAILY. 100 each 0   metFORMIN (GLUCOPHAGE-XR) 500 MG 24 hr tablet TAKE ONE TABLET BY MOUTH TWICE A DAY AFTER A MEAL 180 tablet 0   NOVOLOG FLEXPEN 100 UNIT/ML FlexPen INJECT 25 UNITS TO 31 UNITS INTO THE SKIN THREE TIMES DAILY WITH MEALS. 60 mL 0   pantoprazole (PROTONIX) 40 MG tablet TAKE ONE TABLET (40MG TOTAL) BY MOUTH DAILY 30 tablet 4   pregabalin (LYRICA) 150 MG capsule Take 150 mg by mouth 2 (two) times daily.     PROAIR HFA 108 (90 Base) MCG/ACT inhaler INHALE 4 PUFFS INTO THE LUNGS EVERY 6 HOURS AS NEEDED FOR WHEEZING OR SHORTNESS OF BREATH 8.5 g 1   triamcinolone cream (KENALOG) 0.1 % Apply 1 application topically 2 (two) times daily. 30 g 0   vitamin B-12 (CYANOCOBALAMIN) 500 MCG tablet Take 500 mcg by mouth daily.     No facility-administered medications prior to visit.    Allergies  Allergen Reactions   Kiwi Extract Anaphylaxis, Swelling and Palpitations   Other Anaphylaxis    Walnuts (avoids all tree nuts)    Review of Systems  Constitutional:  Negative for chills, fatigue and fever.  HENT:  Positive for ear pain, postnasal drip and sore throat. Negative for congestion, rhinorrhea, sinus pressure and sinus pain.   Respiratory:  Negative for  cough, shortness of breath and wheezing.       Objective:    Physical Exam  There were no vitals taken for this visit. Wt Readings from Last 3 Encounters:  04/25/21 240 lb 3.2 oz (109 kg)  04/17/21 247 lb 1.9 oz (112.1 kg)  04/12/21 247 lb 6.4 oz (112.2 kg)    Health Maintenance Due  Topic Date Due   Pneumococcal Vaccine 30-57 Years old (1 - PCV) Never done   OPHTHALMOLOGY EXAM  Never done   HIV Screening  Never done   Hepatitis C Screening  Never done   TETANUS/TDAP  Never done   Zoster Vaccines- Shingrix (1 of 2) Never done   PAP SMEAR-Modifier  Never done   COVID-19 Vaccine (3 - Moderna risk series) 12/17/2019   FOOT EXAM  09/08/2020   INFLUENZA VACCINE  01/02/2021    There are no preventive care reminders to display for this patient.   Lab Results  Component Value Date   TSH 1.870 01/09/2021   Lab Results  Component Value Date   WBC 4.8 12/07/2020   HGB 12.9 12/07/2020   HCT 39.8 12/07/2020   MCV 85 12/07/2020   PLT 236 12/07/2020   Lab Results  Component Value Date   NA 142 01/09/2021   K 4.5 01/09/2021   CO2 24 01/09/2021   GLUCOSE 135 (H) 01/09/2021   BUN 11 01/09/2021   CREATININE 0.67 01/09/2021   BILITOT 0.3 01/09/2021   ALKPHOS 93 01/09/2021   AST 37 01/09/2021   ALT 50 (H) 01/09/2021   PROT 6.8 01/09/2021   ALBUMIN 4.3 01/09/2021   CALCIUM 9.7 01/09/2021   ANIONGAP 11 04/22/2018  EGFR 101 01/09/2021   Lab Results  Component Value Date   CHOL 151 01/09/2021   Lab Results  Component Value Date   HDL 50 01/09/2021   Lab Results  Component Value Date   LDLCALC 84 01/09/2021   Lab Results  Component Value Date   TRIG 89 01/09/2021   Lab Results  Component Value Date   CHOLHDL 3.0 01/09/2021   Lab Results  Component Value Date   HGBA1C 9.3 (A) 04/25/2021       Assessment & Plan:   Problem List Items Addressed This Visit       Respiratory   Upper respiratory tract infection - Primary    -sore throat and left ear pain  started yesterday -mild symptoms -will check for flu, strep, and covid -if all are negative, offered symptomatic treatment, but she states that she doesn't think OTC meds will help her        Relevant Orders   Novel Coronavirus, NAA ([facility])     No orders of the defined types were placed in this encounter.  Date:  05/22/2021   Location of Patient: Home Location of Provider: Office Consent was obtain for visit to be over via telehealth. I verified that I am speaking with the correct person using two identifiers.  I connected with  Sandy Valencia on 05/22/21 via telephone and verified that I am speaking with the correct person using two identifiers.   I discussed the limitations of evaluation and management by telemedicine. The patient expressed understanding and agreed to proceed.  Time spent: 6 minutes   Noreene Larsson, NP

## 2021-05-23 ENCOUNTER — Other Ambulatory Visit: Payer: Self-pay | Admitting: Nurse Practitioner

## 2021-05-23 MED ORDER — AZITHROMYCIN 250 MG PO TABS: ORAL_TABLET | ORAL | 0 refills | Status: DC

## 2021-05-23 NOTE — Progress Notes (Signed)
I sent in a z-pack for her.

## 2021-05-24 LAB — NOVEL CORONAVIRUS, NAA: SARS-CoV-2, NAA: NOT DETECTED

## 2021-05-24 LAB — SARS-COV-2, NAA 2 DAY TAT

## 2021-05-24 NOTE — Progress Notes (Signed)
COVID swab is negative.

## 2021-05-30 ENCOUNTER — Other Ambulatory Visit: Payer: Self-pay | Admitting: Internal Medicine

## 2021-06-10 ENCOUNTER — Other Ambulatory Visit: Payer: Self-pay | Admitting: Family Medicine

## 2021-06-10 ENCOUNTER — Other Ambulatory Visit: Payer: Self-pay | Admitting: Nurse Practitioner

## 2021-06-19 ENCOUNTER — Other Ambulatory Visit: Payer: Self-pay | Admitting: Nurse Practitioner

## 2021-06-19 ENCOUNTER — Other Ambulatory Visit: Payer: Self-pay | Admitting: Internal Medicine

## 2021-06-19 DIAGNOSIS — I10 Essential (primary) hypertension: Secondary | ICD-10-CM

## 2021-07-07 DIAGNOSIS — B351 Tinea unguium: Secondary | ICD-10-CM | POA: Diagnosis not present

## 2021-07-07 DIAGNOSIS — L03032 Cellulitis of left toe: Secondary | ICD-10-CM | POA: Diagnosis not present

## 2021-07-07 DIAGNOSIS — L03031 Cellulitis of right toe: Secondary | ICD-10-CM | POA: Diagnosis not present

## 2021-07-13 ENCOUNTER — Other Ambulatory Visit: Payer: Self-pay | Admitting: Internal Medicine

## 2021-07-13 ENCOUNTER — Other Ambulatory Visit: Payer: Self-pay | Admitting: Nurse Practitioner

## 2021-07-20 DIAGNOSIS — E1165 Type 2 diabetes mellitus with hyperglycemia: Secondary | ICD-10-CM | POA: Diagnosis not present

## 2021-07-20 DIAGNOSIS — E039 Hypothyroidism, unspecified: Secondary | ICD-10-CM | POA: Diagnosis not present

## 2021-07-20 DIAGNOSIS — Z794 Long term (current) use of insulin: Secondary | ICD-10-CM | POA: Diagnosis not present

## 2021-07-21 LAB — COMPREHENSIVE METABOLIC PANEL
ALT: 33 IU/L — ABNORMAL HIGH (ref 0–32)
AST: 25 IU/L (ref 0–40)
Albumin/Globulin Ratio: 1.7 (ref 1.2–2.2)
Albumin: 4.5 g/dL (ref 3.8–4.9)
Alkaline Phosphatase: 100 IU/L (ref 44–121)
BUN/Creatinine Ratio: 20 (ref 9–23)
BUN: 16 mg/dL (ref 6–24)
Bilirubin Total: 0.2 mg/dL (ref 0.0–1.2)
CO2: 25 mmol/L (ref 20–29)
Calcium: 10 mg/dL (ref 8.7–10.2)
Chloride: 103 mmol/L (ref 96–106)
Creatinine, Ser: 0.81 mg/dL (ref 0.57–1.00)
Globulin, Total: 2.7 g/dL (ref 1.5–4.5)
Glucose: 176 mg/dL — ABNORMAL HIGH (ref 70–99)
Potassium: 4.5 mmol/L (ref 3.5–5.2)
Sodium: 143 mmol/L (ref 134–144)
Total Protein: 7.2 g/dL (ref 6.0–8.5)
eGFR: 84 mL/min/{1.73_m2} (ref >59)

## 2021-07-21 LAB — TSH: TSH: 1.65 u[IU]/mL (ref 0.450–4.500)

## 2021-07-21 LAB — T4, FREE: Free T4: 1.14 ng/dL (ref 0.82–1.77)

## 2021-07-22 ENCOUNTER — Other Ambulatory Visit: Payer: Self-pay | Admitting: Nurse Practitioner

## 2021-07-24 ENCOUNTER — Other Ambulatory Visit: Payer: Self-pay | Admitting: Internal Medicine

## 2021-07-26 ENCOUNTER — Other Ambulatory Visit: Payer: Self-pay

## 2021-07-26 ENCOUNTER — Ambulatory Visit: Payer: Medicaid Other | Admitting: Nurse Practitioner

## 2021-07-26 ENCOUNTER — Encounter: Payer: Self-pay | Admitting: Nurse Practitioner

## 2021-07-26 VITALS — BP 174/75 | HR 78 | Ht 65.0 in | Wt 247.2 lb

## 2021-07-26 DIAGNOSIS — E039 Hypothyroidism, unspecified: Secondary | ICD-10-CM | POA: Diagnosis not present

## 2021-07-26 DIAGNOSIS — I10 Essential (primary) hypertension: Secondary | ICD-10-CM

## 2021-07-26 DIAGNOSIS — Z794 Long term (current) use of insulin: Secondary | ICD-10-CM | POA: Diagnosis not present

## 2021-07-26 DIAGNOSIS — E1165 Type 2 diabetes mellitus with hyperglycemia: Secondary | ICD-10-CM

## 2021-07-26 DIAGNOSIS — E559 Vitamin D deficiency, unspecified: Secondary | ICD-10-CM | POA: Diagnosis not present

## 2021-07-26 DIAGNOSIS — E782 Mixed hyperlipidemia: Secondary | ICD-10-CM

## 2021-07-26 LAB — POCT GLYCOSYLATED HEMOGLOBIN (HGB A1C): HbA1c, POC (controlled diabetic range): 8.3 % — AB (ref 0.0–7.0)

## 2021-07-26 MED ORDER — LANTUS SOLOSTAR 100 UNIT/ML ~~LOC~~ SOPN: 70.0000 [IU] | PEN_INJECTOR | Freq: Every day | SUBCUTANEOUS | 0 refills | Status: DC

## 2021-07-26 MED ORDER — OZEMPIC (0.25 OR 0.5 MG/DOSE) 2 MG/1.5ML ~~LOC~~ SOPN: 0.5000 mg | PEN_INJECTOR | SUBCUTANEOUS | 3 refills | Status: DC

## 2021-07-26 NOTE — Progress Notes (Signed)
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   Anaphylactic shock, unspecified, subsequent encounter    Anemia, iron deficiency 02/14/2015   Angio-edema    Arthritis    Asthma    Asymptomatic varicose veins of right lower extremity    Chronic obstructive pulmonary disease with (acute) exacerbation (HCC)    Chronic rhinitis 05/21/2018   COVID-19    Diabetes mellitus    Essential (primary) hypertension    Family history of colon cancer 09/03/2012   Fatty liver    Gastro-esophageal reflux disease without esophagitis    GERD (gastroesophageal reflux disease)    History of colonic polyps 08/26/2017   Hypercholesteremia    Hypertension    Hypothyroidism    Low back pain    Mass of finger, right s/p surgical excision (mucoid cyst) 04/17/18 04/22/2018   Mucous cyst of digit of right hand    Obesity, unspecified    Other adverse food reactions, not elsewhere classified, initial encounter    Personal history of noncompliance with medical treatment, presenting hazards to health 05/18/2015   Rash and other nonspecific skin eruption    Sleep apnea    pt siad, "i had small amount but could not tolerate CPAP and they said it was ok since it was mild.   Sleep apnea, unspecified    Unspecified asthma, uncomplicated    Zoster without complications    Past Surgical History:  Procedure Laterality Date   ABDOMINAL HYSTERECTOMY     CHOLECYSTECTOMY     COLONOSCOPY N/A 09/22/2012   RMR: colonic polyps -removed as described above. tubular adenoma, next TCS 09/2017   COLONOSCOPY WITH PROPOFOL N/A 09/23/2017   Procedure: COLONOSCOPY WITH PROPOFOL;  Surgeon: Daneil Dolin, MD;  [facility]: [facility];  Service: Endoscopy;  Laterality: N/A;  7:30am   CYST EXCISION Left  07/19/2016   Procedure: CYST REMOVAL LEFT RING FINGER;  Surgeon: Carole Civil, MD;  Location: [facility];  Service: Orthopedics;  Laterality: Left;   CYST REMOVAL LEG Right    foot   ESOPHAGOGASTRODUODENOSCOPY N/A 10/21/2014   RMR: Small hiatal hernia; otherwise normal EGD status post passage of a Maloney dilator.    EXCISION MASS UPPER EXTREMETIES Right 04/17/2018   Procedure: EXCISION MASS UPPER EXTREMETIES right ring finger;  Surgeon: Carole Civil, MD;  Location: [facility];  Service: Orthopedics;  Laterality: Right;   MALONEY DILATION N/A 10/21/2014   Procedure: [name] DILATION;  Surgeon: Daneil Dolin, MD;  Location: [facility];  Service: Endoscopy;  Laterality: N/A;   MIDDLE EAR SURGERY Right    patched hole in ear drum   POLYPECTOMY  09/23/2017   Procedure: POLYPECTOMY;  Surgeon: Daneil Dolin, MD;  Location: [facility];  Service: Endoscopy;;  polyp hepatic flexure polyp cs, splenic flexure polyp cs, rectal polyp cs   Social History   Socioeconomic History   Marital status: Divorced    Spouse name: Not on file   Number of children: 2   Years of education: Not on file   Highest education level: Not on file  Occupational History   Not on file  Tobacco Use   Smoking status: Never   Smokeless tobacco: Never  Vaping Use   Vaping Use:  Never used  Substance and Sexual Activity   Alcohol use: No    Alcohol/week: 0.0 standard drinks   Drug use: No   Sexual activity: Yes    Birth control/protection: Surgical  Other Topics Concern   Not on file  Social History Narrative   Lives with grandson (since born)      Enjoys: vacation, travel, concerts      Diet: eats all food groups    Caffeine: diet Pepsi daily    Water:  8 cups daily       Wears seat belt    Does use phone   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   ACCU-CHEK GUIDE test strip USE TO TEST 4 TIMES DAILY.   aspirin EC 81 MG tablet Take 81 mg by mouth daily.   cetirizine (ZYRTEC) 10 MG tablet TAKE ONE TABLET (10MG TOTAL) BY MOUTH DAILY   Cholecalciferol (VITAMIN D-3) 1000 UNITS CAPS Take 1,000 Units by mouth daily.    Continuous Blood Gluc Sensor (DEXCOM G6 SENSOR) MISC Change sensor every 10 days as directed   Continuous Blood Gluc Transmit (DEXCOM G6 TRANSMITTER) MISC Change transmitter every 90 days as directed.   Diclofenac Sodium 3 % GEL APPLY 2 GRAMS TO AFFECTED AREAS 2 TIMES A DAY AS NEEDED   EPINEPHrine 0.3 mg/0.3 mL IJ SOAJ injection Inject 0.3 mg into the muscle as needed for anaphylaxis.   fluticasone (FLONASE) 50 MCG/ACT nasal spray Place 1 spray into both nostrils daily.   glipiZIDE (GLUCOTROL XL) 10 MG 24 hr tablet TAKE ONE TABLET (10MG TOTAL) BY MOUTH DAILY WITH BREAKFAST   HYDROcodone-acetaminophen (NORCO) 7.5-325 MG tablet Take by mouth.   ketoconazole (NIZORAL) 2 % cream APPLY TO AFFECTED AREAS TWICE DAILY   levothyroxine (SYNTHROID) 75 MCG tablet TAKE ONE (1) TABLET BY MOUTH EVERY DAY BEFORE BREAKFAST   lisinopril (ZESTRIL) 20 MG tablet TAKE ONE TABLET (20MG TOTAL) BY MOUTH DAILY   LITETOUCH PEN NEEDLES 31G X 8 MM MISC USE AS DIRECTED FOUR TIMES DAILY.   metFORMIN (GLUCOPHAGE-XR) 500 MG 24 hr tablet TAKE ONE TABLET BY MOUTH TWICE A DAY AFTER A MEAL   NOVOLOG FLEXPEN 100 UNIT/ML FlexPen INJECT 25 UNITS TO 31 UNITS INTO THE SKIN THREE TIMES DAILY WITH MEALS.   pantoprazole (PROTONIX) 40 MG tablet TAKE ONE TABLET (40MG TOTAL) BY MOUTH DAILY   pregabalin (LYRICA) 150 MG capsule Take 150 mg by mouth 2 (two) times daily.   PROAIR HFA 108 (90 Base) MCG/ACT inhaler INHALE 4 PUFFS INTO THE LUNGS EVERY 6 HOURS AS  NEEDED FOR WHEEZING OR SHORTNESS OF BREATH   Semaglutide,0.25 or 0.5MG/DOS, (OZEMPIC, 0.25 OR 0.5 MG/DOSE,) 2 MG/1.5ML SOPN Inject 0.5 mg into the skin once a week.   triamcinolone cream (KENALOG) 0.1 % Apply 1 application topically 2 (two) times daily.   vitamin B-12 (CYANOCOBALAMIN) 500 MCG tablet Take 500 mcg by mouth daily.   [DISCONTINUED] LANTUS SOLOSTAR 100 UNIT/ML Solostar Pen INJECT 80 UNITS SUBCUTANEOUSLY INTO THE SKIN DAILY.   insulin glargine (LANTUS SOLOSTAR) 100 UNIT/ML Solostar Pen Inject 70 Units into the skin at  bedtime.   [DISCONTINUED] azithromycin (ZITHROMAX) 250 MG tablet Please dispense as a z-pack (Patient not taking: Reported on 07/26/2021)   No facility-administered encounter medications on file as of 07/26/2021.   ALLERGIES: Allergies  Allergen Reactions   Kiwi Extract Anaphylaxis, Swelling and Palpitations   Other Anaphylaxis    Walnuts (avoids all tree nuts)   VACCINATION STATUS: Immunization History  Administered Date(s) Administered   Influenza,inj,Quad PF,6+ Mos 04/15/2017, 03/30/2019   Influenza-Unspecified 04/17/2011, 03/24/2012, 05/12/2013, 02/23/2015, 04/04/2017, 04/08/2018   Moderna Sars-Covid-2 Vaccination 10/22/2019, 11/19/2019    Diabetes She presents for her follow-up diabetic visit. She has type 2 diabetes mellitus. Onset time: She was diagnosed at approximate age of 2 years. Her disease course has been improving. Hypoglycemia symptoms include hunger. Pertinent negatives for hypoglycemia include no confusion, nervousness/anxiousness, pallor, seizures or tremors. Pertinent negatives for diabetes include no fatigue, no polydipsia, no polyphagia, no polyuria and no weight loss. There are no hypoglycemic complications. Symptoms are stable. There are no diabetic complications. Risk factors for coronary artery disease include dyslipidemia, diabetes mellitus, hypertension, sedentary lifestyle, obesity, post-menopausal and stress. Current diabetic treatment  includes oral agent (dual therapy) and intensive insulin program. She is compliant with treatment most of the time. Her weight is fluctuating minimally. She is following a generally unhealthy diet. When asked about meal planning, she reported none. She has had a previous visit with a dietitian. She never participates in exercise. Her home blood glucose trend is decreasing steadily. (She presents today with her CGM on her phone but cannot remember her password for me to review her numbers.  Her POCT A1c today is 8.3%, decreasing from last visit of 9.3%.  She reports drops in glucose in the early afternoon, needing to recover by eating a snack.  She is interested on getting back on the Ozempic (previously insurance did not cover optimally- but she would like to try again).  She also reports going on a low carb diet (eating only 10 g of carbs all day).) An ACE inhibitor/angiotensin II receptor blocker is being taken. She sees a podiatrist.Eye exam is current.  Hyperlipidemia This is a chronic problem. The current episode started more than 1 year ago. The problem is controlled. Recent lipid tests were reviewed and are normal. Exacerbating diseases include diabetes, hypothyroidism and obesity. There are no known factors aggravating her hyperlipidemia. Pertinent negatives include no myalgias. Current antihyperlipidemic treatment includes statins. The current treatment provides significant improvement of lipids. Compliance problems include medication cost, adherence to diet and adherence to exercise.  Risk factors for coronary artery disease include diabetes mellitus, dyslipidemia, hypertension, obesity, a sedentary lifestyle and post-menopausal.  Hypertension This is a chronic problem. The current episode started more than 1 year ago. The problem has been waxing and waning since onset. The problem is uncontrolled. Pertinent negatives include no palpitations. Agents associated with hypertension include thyroid  hormones. Risk factors for coronary artery disease include diabetes mellitus, dyslipidemia, obesity, post-menopausal state, sedentary lifestyle and stress. Past treatments include ACE inhibitors. The current treatment provides mild improvement. Compliance problems include diet and exercise.  Identifiable causes of hypertension include sleep apnea and a thyroid problem.  Thyroid Problem Presents for follow-up visit. Patient reports no anxiety, cold intolerance, constipation, depressed mood, diarrhea, dry skin, fatigue, hair loss, heat intolerance, palpitations, tremors, weight gain or weight loss. The symptoms have been stable. Her past medical history is significant for diabetes and hyperlipidemia.    Review of systems  Constitutional: + minimally fluctuating body weight,  current Body  mass index is 41.14 kg/m. , no fatigue, no subjective hyperthermia, no subjective hypothermia, + increased appetite Eyes: no blurry vision, no xerophthalmia ENT: no sore throat, no nodules palpated in throat, no dysphagia/odynophagia, no hoarseness Cardiovascular: no chest pain, no shortness of breath, no palpitations, no leg swelling Respiratory: no cough, no shortness of breath Gastrointestinal: no nausea/vomiting/diarrhea Musculoskeletal: no muscle/joint aches Skin: no rashes, no hyperemia Neurological: no tremors, no numbness, no tingling, no dizziness Psychiatric: no depression, no anxiety   Objective:    BP (!) 174/75    Pulse 78    Ht '5\' 5"'  (1.651 m)    Wt 247 lb 3.2 oz (112.1 kg)    SpO2 98%    BMI 41.14 kg/m   Wt Readings from Last 3 Encounters:  07/26/21 247 lb 3.2 oz (112.1 kg)  04/25/21 240 lb 3.2 oz (109 kg)  04/17/21 247 lb 1.9 oz (112.1 kg)    BP Readings from Last 3 Encounters:  07/26/21 (!) 174/75  04/25/21 (!) 169/72  04/17/21 136/78     Physical Exam- Limited  Constitutional:  Body mass index is 41.14 kg/m. , not in acute distress, slightly anxious state of mind Eyes:  EOMI,  no exophthalmos Neck: Supple Cardiovascular: RRR, no murmurs, rubs, or gallops, no edema Respiratory: Adequate breathing efforts, no crackles, rales, rhonchi, or wheezing Musculoskeletal: no gross deformities, strength intact in all four extremities, no gross restriction of joint movements Skin:  no rashes, no hyperemia Neurological: no tremor with outstretched hands    Lipid Panel     Component Value Date/Time   CHOL 151 01/09/2021 0828   TRIG 89 01/09/2021 0828   HDL 50 01/09/2021 0828   CHOLHDL 3.0 01/09/2021 0828   CHOLHDL 2.4 12/25/2019 0709   VLDL 10 03/07/2016 0705   LDLCALC 84 01/09/2021 0828   LDLCALC 65 12/25/2019 0709    Recent Results (from the past 2160 hour(s))  Novel Coronavirus, NAA ([facility])     Status: None   Collection Time: 05/22/21  4:40 PM   Specimen: Nasopharyngeal(NP) swabs in vial transport medium  Result Value Ref Range   SARS-CoV-2, NAA Not Detected Not Detected    Comment: This nucleic acid amplification test was developed and its performance characteristics determined by [employer]. Nucleic acid amplification tests include RT-PCR and TMA. This test has not been FDA cleared or approved. This test has been authorized by FDA under an Emergency Use Authorization (EUA). This test is only authorized for the duration of time the declaration that circumstances exist justifying the authorization of the emergency use of in vitro diagnostic tests for detection of SARS-CoV-2 virus and/or diagnosis of COVID-19 infection under section 564(b)(1) of the Act, 21 U.S.C. 326ZTI-4(P) (1), unless the authorization is terminated or revoked sooner. When diagnostic testing is negative, the possibility of a false negative result should be considered in the context of a patient's recent exposures and the presence of clinical signs and symptoms consistent with COVID-19. An individual without symptoms of COVID-19 and who is not shedding SARS-CoV-2 virus wo uld  expect to have a negative (not detected) result in this assay.   SARS-COV-2, NAA 2 DAY TAT     Status: None   Collection Time: 05/22/21  4:40 PM  Result Value Ref Range   SARS-CoV-2, NAA 2 DAY TAT Performed   POCT Influenza A/B     Status: Normal   Collection Time: 05/22/21  4:42 PM  Result Value Ref Range   Influenza A, POC Negative Negative  Influenza B, POC Negative Negative  POCT rapid strep A     Status: Normal   Collection Time: 05/22/21  4:42 PM  Result Value Ref Range   Rapid Strep A Screen Negative Negative  Comprehensive metabolic panel     Status: Abnormal   Collection Time: 07/20/21  8:14 AM  Result Value Ref Range   Glucose 176 (H) 70 - 99 mg/dL   BUN 16 6 - 24 mg/dL   Creatinine, Ser 0.81 0.57 - 1.00 mg/dL   eGFR 84 >59 mL/min/1.73   BUN/Creatinine Ratio 20 9 - 23   Sodium 143 134 - 144 mmol/L   Potassium 4.5 3.5 - 5.2 mmol/L   Chloride 103 96 - 106 mmol/L   CO2 25 20 - 29 mmol/L   Calcium 10.0 8.7 - 10.2 mg/dL   Total Protein 7.2 6.0 - 8.5 g/dL   Albumin 4.5 3.8 - 4.9 g/dL   Globulin, Total 2.7 1.5 - 4.5 g/dL   Albumin/Globulin Ratio 1.7 1.2 - 2.2   Bilirubin Total 0.2 0.0 - 1.2 mg/dL   Alkaline Phosphatase 100 44 - 121 IU/L   AST 25 0 - 40 IU/L   ALT 33 (H) 0 - 32 IU/L  TSH     Status: None   Collection Time: 07/20/21  8:14 AM  Result Value Ref Range   TSH 1.650 0.450 - 4.500 uIU/mL  T4, free     Status: None   Collection Time: 07/20/21  8:14 AM  Result Value Ref Range   Free T4 1.14 0.82 - 1.77 ng/dL  POCT glycosylated hemoglobin (Hb A1C)     Status: Abnormal   Collection Time: 07/26/21  9:39 AM  Result Value Ref Range   Hemoglobin A1C     HbA1c POC (<> result, manual entry)     HbA1c, POC (prediabetic range)     HbA1c, POC (controlled diabetic range) 8.3 (A) 0.0 - 7.0 %      Assessment & Plan:   1) Uncontrolled type 2 diabetes mellitus with complication, with long-term current use of insulin ([place])  She presents today with her CGM on her  phone but cannot remember her password for me to review her numbers.  Her POCT A1c today is 8.3%, decreasing from last visit of 9.3%.  She reports drops in glucose in the early afternoon, needing to recover by eating a snack.  She is interested on getting back on the Ozempic (previously insurance did not cover optimally- but she would like to try again).  She also reports going on a low carb diet (eating only 10 g of carbs all day).  -Her diabetes is complicated by history of noncompliance (she recently showed better engagement) and patient remains at extremely high risk for more acute and chronic complications of diabetes which include CAD, CVA, CKD, retinopathy, and neuropathy. These are all discussed in detail with the patient.  - Nutritional counseling repeated at each appointment due to patients tendency to fall back in to old habits.  - The patient admits there is a room for improvement in their diet and drink choices. -  Suggestion is made for the patient to avoid simple carbohydrates from their diet including Cakes, Sweet Desserts / Pastries, Ice Cream, Soda (diet and regular), Sweet Tea, Candies, Chips, Cookies, Sweet Pastries, Store Bought Juices, Alcohol in Excess of 1-2 drinks a day, Artificial Sweeteners, Coffee Creamer, and "Sugar-free" Products. This will help patient to have stable blood glucose profile and potentially avoid unintended weight gain.   -  I encouraged the patient to switch to unprocessed or minimally processed complex starch and increased protein intake (animal or plant source), fruits, and vegetables.   - Patient is advised to stick to a routine mealtimes to eat 3 meals a day and avoid unnecessary snacks (to snack only to correct hypoglycemia).  - I have approached patient with the following individualized plan to manage diabetes and patient agrees.  -She will continue to require intensive treatment with  basal/bolus insulin in order for her to achieve and maintain  control of diabetes to target.  -She is advised to lower her Lantus to 70 units SQ nightly to prevent drops in her glucose.  She can continue her current dose of Novolog 25-31 units TID with meals if glucose is above 90 and she is eating (Specific instructions on how to titrate insulin dosage based on glucose readings given to patient in writing).  She can continue her Metformin 500 mg ER po twice daily with meals and Glipizide 10 mg XL daily with breakfast for now.  I also reinitiated Ozempic 0.25 mg SQ weekly x 2 weeks, then advance to 0.5 mg SQ weekly thereafter if tolerated well.  -She is encouraged to continue monitoring blood glucose 4 times daily-using her CGM, before meals and before bed, and to call the clinic if she has readings less than 70 or greater than 300 for 3 tests in a row.  She has benefited from CGM device and is advised to continue.  2) Lipids/HPL:  Her most recent lipid panel from 01/09/21 shows controlled LDL of 84.  She was taken off her Statin due to elevated liver enzymes- which are now improving steadily.  Side effects and precautions discussed with her.    3) Hypertension:  Her blood pressure is not controlled to target.  She is advised to continue Lisinopril 20 mg po daily (managed and recently increased by her PCP).  4) Hypothyroidism:  -Her previsit TFTs are consistent with appropriate hormone replacement.  She is advised to continue her current dose of Levothyroxine 75 mcg po daily before breakfast (skipping 2 days out of the week).    - We discussed about the correct intake of her thyroid hormone, on empty stomach at fasting, with water, separated by at least 30 minutes from breakfast and other medications,  and separated by more than 4 hours from calcium, iron, multivitamins, acid reflux medications (PPIs). -Patient is made aware of the fact that thyroid hormone replacement is needed for life, dose to be adjusted by periodic monitoring of thyroid function  tests.    - I advised patient to maintain close follow up with Lindell Spar, MD for primary care needs.     I spent 43 minutes in the care of the patient today including review of labs from [place], Lipids, Thyroid Function, Hematology (current and previous including abstractions from other facilities); face-to-face time discussing  her blood glucose readings/logs, discussing hypoglycemia and hyperglycemia episodes and symptoms, medications doses, her options of short and long term treatment based on the latest standards of care / guidelines;  discussion about incorporating lifestyle medicine;  and documenting the encounter.    Please refer to Patient Instructions for Blood Glucose Monitoring and Insulin/Medications Dosing Guide"  in media tab for additional information. Please  also refer to " Patient Self Inventory" in the Media  tab for reviewed elements of pertinent patient history.  Gillian Scarce participated in the discussions, expressed understanding, and voiced agreement with the above plans.  All questions were answered to her satisfaction. she is encouraged to contact clinic should she have any questions or concerns prior to her return visit.    Follow up plan: -Return in about 3 months (around 10/23/2021) for Diabetes F/U with A1c in office, No previsit labs, Bring meter and logs.  Rayetta Pigg, [facility] [address] [place], [place] [address] Phone: [phone] Fax: [phone]   07/26/2021, 10:24 AM

## 2021-07-26 NOTE — Patient Instructions (Signed)
Diabetes Mellitus Emergency Preparedness Plan A diabetes emergency preparedness plan is a checklist to make sure you have everything you need to manage your diabetes in case of an emergency, such as an evacuation, natural disaster, national security emergency, or pandemic lockdown. Managing your diabetes is something you have to do all day every day. The American Diabetes Association and the [employer] of Endocrinology both recommend putting together an emergency diabetes kit. Your kit should include important information and documents as well as all the supplies you will need to manage your diabetes for at least 1 week. Store it in a portable, Engineer, materials. The best time to start making your emergency kit is now. How to make your emergency kit Collect information and documents Include the following information and documents in your kit: The type of diabetes you have. A copy of your health insurance cards and photo ID. A list of all your other medical conditions, allergies, and surgeries. A list of all your medicines and doses with the contact information for your pharmacy. Ask your health care provider for a list of your current medicines. Any recent lab results, including your latest hemoglobin A1C (HbA1C). The make, model, and serial number of your insulin pump, if you use one. Also include contact information for the manufacturer. Contact information for people who should be notified in case of an emergency. Include your health care provider's name, address, and phone number. Collect diabetes care items Include the following diabetes care items in your kit: At least a 1-week supply of: Oral medicines. Insulin. Blood glucose testing supplies. These include testing strips, lancets, and extra batteries for your blood glucose monitor and pump. A charger for the continuous glucose monitor (CGM) receiver and pump. Any extra supplies needed for your CGM or pump. A supply of  glucagon, glucose tablets, juice, soda, or hard candy in case of hypoglycemia. Coolers or cold packs. A safe container for syringes, needles, and lancets.  Other preparations Other things to consider doing as part of your emergency plan: Make sure that your mobile phone is charged and that you have an extra charger, cable, or batteries. Choose a meeting place for family members. Wear a medical alert or ID bracelet. If you have a child with diabetes, make sure your child's school has a copy of his or her emergency plan, including the name of the staff member who will assist your child. Where to find more information American Diabetes Association: www.diabetes.org Centers for Disease Control and Prevention: blogs.StoreMirror.com.cy Summary A diabetes emergency preparedness plan is a checklist to make sure you have everything you need in case of an emergency. Your kit should include important information and documents as well as all the supplies you will need to manage your condition for at least 1 week. Store your kit in a portable, Engineer, materials. The best time to start making your emergency kit is now. This information is not intended to replace advice given to you by your health care provider. Make sure you discuss any questions you have with your health care provider. Document Revised: 11/26/2019 Document Reviewed: 11/26/2019 Elsevier Patient Education  [place].

## 2021-08-08 ENCOUNTER — Other Ambulatory Visit: Payer: Self-pay | Admitting: Internal Medicine

## 2021-08-08 ENCOUNTER — Other Ambulatory Visit: Payer: Self-pay | Admitting: Nurse Practitioner

## 2021-08-15 ENCOUNTER — Ambulatory Visit: Payer: Medicaid Other | Admitting: Internal Medicine

## 2021-08-15 DIAGNOSIS — M542 Cervicalgia: Secondary | ICD-10-CM | POA: Diagnosis not present

## 2021-08-15 DIAGNOSIS — M545 Low back pain, unspecified: Secondary | ICD-10-CM | POA: Diagnosis not present

## 2021-08-15 DIAGNOSIS — Z79891 Long term (current) use of opiate analgesic: Secondary | ICD-10-CM | POA: Diagnosis not present

## 2021-08-15 DIAGNOSIS — M79673 Pain in unspecified foot: Secondary | ICD-10-CM | POA: Diagnosis not present

## 2021-08-15 DIAGNOSIS — G8929 Other chronic pain: Secondary | ICD-10-CM | POA: Diagnosis not present

## 2021-08-21 ENCOUNTER — Other Ambulatory Visit: Payer: Self-pay | Admitting: Internal Medicine

## 2021-08-21 ENCOUNTER — Other Ambulatory Visit: Payer: Self-pay | Admitting: Nurse Practitioner

## 2021-08-21 DIAGNOSIS — I10 Essential (primary) hypertension: Secondary | ICD-10-CM

## 2021-09-05 ENCOUNTER — Encounter: Payer: Self-pay | Admitting: Internal Medicine

## 2021-09-05 ENCOUNTER — Ambulatory Visit: Payer: Medicaid Other | Admitting: Internal Medicine

## 2021-09-05 VITALS — BP 142/92 | HR 82 | Resp 18 | Ht 65.0 in | Wt 236.2 lb

## 2021-09-05 DIAGNOSIS — E1165 Type 2 diabetes mellitus with hyperglycemia: Secondary | ICD-10-CM | POA: Diagnosis not present

## 2021-09-05 DIAGNOSIS — Z114 Encounter for screening for human immunodeficiency virus [HIV]: Secondary | ICD-10-CM

## 2021-09-05 DIAGNOSIS — Z1159 Encounter for screening for other viral diseases: Secondary | ICD-10-CM | POA: Diagnosis not present

## 2021-09-05 DIAGNOSIS — E559 Vitamin D deficiency, unspecified: Secondary | ICD-10-CM | POA: Diagnosis not present

## 2021-09-05 DIAGNOSIS — M47816 Spondylosis without myelopathy or radiculopathy, lumbar region: Secondary | ICD-10-CM

## 2021-09-05 DIAGNOSIS — E039 Hypothyroidism, unspecified: Secondary | ICD-10-CM | POA: Diagnosis not present

## 2021-09-05 DIAGNOSIS — E782 Mixed hyperlipidemia: Secondary | ICD-10-CM | POA: Diagnosis not present

## 2021-09-05 DIAGNOSIS — Z794 Long term (current) use of insulin: Secondary | ICD-10-CM

## 2021-09-05 DIAGNOSIS — I10 Essential (primary) hypertension: Secondary | ICD-10-CM | POA: Diagnosis not present

## 2021-09-05 MED ORDER — KETOROLAC TROMETHAMINE 60 MG/2ML IM SOLN
60.0000 mg | Freq: Once | INTRAMUSCULAR | Status: AC
  Administered 2021-09-05: 60 mg via INTRAMUSCULAR

## 2021-09-05 NOTE — Assessment & Plan Note (Signed)
Lab Results  ?Component Value Date  ? TSH 1.650 07/20/2021  ? ?On levothyroxine 75 mcg QD, followed by Endocrinology ?

## 2021-09-05 NOTE — Progress Notes (Signed)
? ?Established Patient Office Visit ? ?Subjective:  ?Patient ID: Sandy Valencia, female    DOB: Oct 27, 1960  Age: 61 y.o. MRN: [id number] ? ?CC:  ?Chief Complaint  ?Patient presents with  ? Follow-up  ?  4 month follow up pt has lower back pain has been going on for about 5 days   ? ? ?HPI ?Sandy Valencia is a 61 y.o. female with past medical history of HTN, DM, hypothyroidism, NAFLD, GERD, chronic pain syndrome and morbid obesity who presents for follow up of her chronic medical conditions. ? ?She complains of acute on chronic low back pain, which is worse for the last 5 days, which is sharp, 10/10 and worse with movement.  Denies any recent fall or injury.  Of note, she has history of lumbar spondylosis, and takes Lyrica and Norco for it, followed by [facility] neurology.  She currently denies any numbness or weakness of the LE.  Denies any saddle anesthesia, urinary or stool incontinence. ? ?HTN: Her BP was elevated today, which could be due to her severe low back pain.  Her BP usually remains well controlled at home with lisinopril.  She denies any headache, dizziness, chest pain, dyspnea or palpitations. ? ?Type II DM: Followed by endocrinology.  She has started taking Ozempic.  She takes Lantus 60 units nightly and takes NovoLog ISS in addition to metformin and glipizide.  Her blood glucose has been ranging around 120-200 most of the time, she has Dexcom currently.  She has lost about 9 lbs since the last visit. ? ? ? ? ?Past Medical History:  ?Diagnosis Date  ? Allergic rhinitis due to pollen   ? 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 Anaphylactic shock, unspecified, subsequent encounter   ? Anemia, iron deficiency 02/14/2015  ? Angio-edema   ? Arthritis   ? Asthma   ? Asymptomatic varicose veins of right lower extremity   ? Chronic obstructive pulmonary disease with (acute) exacerbation (HCC)   ? Chronic rhinitis 05/21/2018  ? COVID-19   ? Diabetes mellitus   ? Essential (primary)  hypertension   ? Family history of colon cancer 09/03/2012  ? Fatty liver   ? Gastro-esophageal reflux disease without esophagitis   ? GERD (gastroesophageal reflux disease)   ? History of colonic polyps 08/26/2017  ? Hypercholesteremia   ? Hypertension   ? Hypothyroidism   ? Low back pain   ? Mass of finger, right s/p surgical excision (mucoid cyst) 04/17/18 04/22/2018  ? Mucous cyst of digit of right hand   ? Obesity, unspecified   ? Other adverse food reactions, not elsewhere classified, initial encounter   ? Personal history of noncompliance with medical treatment, presenting hazards to health 05/18/2015  ? Rash and other nonspecific skin eruption   ? Sleep apnea   ? pt siad, "i had small amount but could not tolerate CPAP and they said it was ok since it was mild.  ? Sleep apnea, unspecified   ? Unspecified asthma, uncomplicated   ? Zoster without complications   ? ? ?Past Surgical History:  ?Procedure Laterality Date  ? ABDOMINAL HYSTERECTOMY    ? CHOLECYSTECTOMY    ? COLONOSCOPY N/A 09/22/2012  ? RMR: colonic polyps -removed as described above. tubular adenoma, next TCS 09/2017  ? COLONOSCOPY WITH PROPOFOL N/A 09/23/2017  ? Procedure: COLONOSCOPY WITH PROPOFOL;  Surgeon: Daneil Dolin, MD;  [facility];  Service: Endoscopy;  Laterality: N/A;  7:30am  ? CYST EXCISION  Left 07/19/2016  ? Procedure: CYST REMOVAL LEFT RING FINGER;  Surgeon: Carole Civil, MD;  Location: [facility];  Service: Orthopedics;  Laterality: Left;  ? CYST REMOVAL LEG Right   ? foot  ? ESOPHAGOGASTRODUODENOSCOPY N/A 10/21/2014  ? RMR: Small hiatal hernia; otherwise normal EGD status post passage of a Maloney dilator.   ? EXCISION MASS UPPER EXTREMETIES Right 04/17/2018  ? Procedure: EXCISION MASS UPPER EXTREMETIES right ring finger;  Surgeon: Carole Civil, MD;  Location: [facility];  Service: Orthopedics;  Laterality: Right;  ? MALONEY DILATION N/A 10/21/2014  ? Procedure: MALONEY DILATION;  Surgeon: Daneil Dolin, MD;  Location:  [facility];  Service: Endoscopy;  Laterality: N/A;  ? MIDDLE EAR SURGERY Right   ? patched hole in ear drum  ? POLYPECTOMY  09/23/2017  ? Procedure: POLYPECTOMY;  Surgeon: Daneil Dolin, MD;  Location: [facility];  Service: Endoscopy;;  polyp hepatic flexure polyp cs, splenic flexure polyp cs, rectal polyp cs  ? ? ?Family History  ?Problem Relation Age of Onset  ? Colon cancer Mother   ?     diagnosed age 32, underwent surgical resection, metastatic disease several years later  ? Diabetes Mother   ? Heart attack Father   ? Diabetes Sister   ? Hypothyroidism Brother   ? Diabetes Sister   ? Diabetes Sister   ? Diabetes Sister   ? Hypothyroidism Sister   ? Allergic rhinitis Neg Hx   ? Angioedema Neg Hx   ? Asthma Neg Hx   ? Atopy Neg Hx   ? Eczema Neg Hx   ? Immunodeficiency Neg Hx   ? Urticaria Neg Hx   ? ? ?Social History  ? ?Socioeconomic History  ? Marital status: Divorced  ?  Spouse name: Not on file  ? Number of children: 2  ? Years of education: Not on file  ? Highest education level: Not on file  ?Occupational History  ? Not on file  ?Tobacco Use  ? Smoking status: Never  ? Smokeless tobacco: Never  ?Vaping Use  ? Vaping Use: Never used  ?Substance and Sexual Activity  ? Alcohol use: No  ?  Alcohol/week: 0.0 standard drinks  ? Drug use: No  ? Sexual activity: Yes  ?  Birth control/protection: Surgical  ?Other Topics Concern  ? Not on file  ?Social History Narrative  ? Lives with grandson (since born)  ?   ? Enjoys: vacation, travel, concerts  ?   ? Diet: eats all food groups   ? Caffeine: diet Pepsi daily   ? Water:  8 cups daily   ?   ? Wears seat belt   ? Does use phone  ? Smoke detectors   ? Weapons in safe area   ? ?Social Determinants of Health  ? ?Financial Resource Strain: Not on file  ?Food Insecurity: Not on file  ?Transportation Needs: Not on file  ?Physical Activity: Not on file  ?Stress: Not on file  ?Social Connections: Not on file  ?Intimate Partner Violence: Not on file  ? ? ?Outpatient  Medications Prior to Visit  ?Medication Sig Dispense Refill  ? Accu-Chek FastClix Lancets MISC USE TO TEST 4 TIMES DAILY. 102 each 0  ? ACCU-CHEK GUIDE test strip USE TO TEST 4 TIMES DAILY. 100 strip 0  ? aspirin EC 81 MG tablet Take 81 mg by mouth daily.    ? cetirizine (ZYRTEC) 10 MG tablet TAKE ONE TABLET ('10MG'$  TOTAL) BY MOUTH  DAILY 30 tablet 3  ? Cholecalciferol (VITAMIN D-3) 1000 UNITS CAPS Take 1,000 Units by mouth daily.     ? Continuous Blood Gluc Sensor (DEXCOM G6 SENSOR) MISC Change sensor every 10 days as directed 9 each 3  ? Continuous Blood Gluc Transmit (DEXCOM G6 TRANSMITTER) MISC Change transmitter every 90 days as directed. 1 each 3  ? Diclofenac Sodium 3 % GEL APPLY 2 GRAMS TO AFFECTED AREAS 2 TIMES A DAY AS NEEDED 100 g 0  ? EPINEPHrine 0.3 mg/0.3 mL IJ SOAJ injection Inject 0.3 mg into the muscle as needed for anaphylaxis. 2 each 1  ? fluticasone (FLONASE) 50 MCG/ACT nasal spray Place 1 spray into both nostrils daily. 16 g 3  ? glipiZIDE (GLUCOTROL XL) 10 MG 24 hr tablet TAKE ONE TABLET ('10MG'$  TOTAL) BY MOUTH DAILY WITH BREAKFAST 90 tablet 0  ? HYDROcodone-acetaminophen (NORCO) 7.5-325 MG tablet Take by mouth.    ? insulin glargine (LANTUS SOLOSTAR) 100 UNIT/ML Solostar Pen Inject 70 Units into the skin at bedtime. 30 mL 0  ? ketoconazole (NIZORAL) 2 % cream APPLY TO AFFECTED AREAS TWICE DAILY 60 g 2  ? levothyroxine (SYNTHROID) 75 MCG tablet TAKE ONE (1) TABLET BY MOUTH EVERY DAY BEFORE BREAKFAST 90 tablet 0  ? lisinopril (ZESTRIL) 20 MG tablet TAKE ONE TABLET ('20MG'$  TOTAL) BY MOUTH DAILY 90 tablet 1  ? LITETOUCH PEN NEEDLES 31G X 8 MM MISC USE AS DIRECTED FOUR TIMES DAILY. 100 each 0  ? metFORMIN (GLUCOPHAGE-XR) 500 MG 24 hr tablet TAKE ONE TABLET BY MOUTH TWICE A DAY AFTER A MEAL 180 tablet 0  ? NOVOLOG FLEXPEN 100 UNIT/ML FlexPen INJECT 25 UNITS TO 31 UNITS INTO THE SKIN THREE TIMES DAILY WITH MEALS. 60 mL 0  ? pantoprazole (PROTONIX) 40 MG tablet TAKE ONE TABLET ('40MG'$  TOTAL) BY MOUTH DAILY 30  tablet 4  ? pregabalin (LYRICA) 150 MG capsule Take 150 mg by mouth 2 (two) times daily.    ? PROAIR HFA 108 (90 Base) MCG/ACT inhaler INHALE 4 PUFFS INTO THE LUNGS EVERY 6 HOURS AS NEEDED FOR WHEEZING OR SHORTNE

## 2021-09-05 NOTE — Assessment & Plan Note (Signed)
Last lipid profile reviewed ?Was on statin due to DM, held statin for now due to elevated liver enzymes ?

## 2021-09-05 NOTE — Assessment & Plan Note (Signed)
Lab Results  ?Component Value Date  ? HGBA1C 8.3 (A) 07/26/2021  ? ?Follows up with Endocrinology ?Needs to take Lantus 70 units and NovoLog ISS ?Continue metformin and glipizide ?Tried to give her statin in the past, had transaminitis -will try again to add statin later ?Takes Lyrica for neuropathy/low back pain ?

## 2021-09-05 NOTE — Assessment & Plan Note (Signed)
Last MRI of lumbar spine in 2004 ?Currently on Lyrica and as needed Norco, followed by pain clinic -[facility] neurology ?Acute flareup of low back pain, Toradol in the office today ?Simple back exercises ?If persistent, will get imaging and/or spine surgery referral ?

## 2021-09-05 NOTE — Assessment & Plan Note (Signed)
BP Readings from Last 1 Encounters:  ?09/05/21 (!) 142/92  ? ?Elevated today, could be due to severe low back pain ?Usually well-controlled with Lisinopril 20 mg QD now ?Counseled for compliance with the medications ?Advised DASH diet and moderate exercise/walking, at least 150 mins/week ?

## 2021-09-05 NOTE — Assessment & Plan Note (Signed)
Diet modification and moderate exercise advised °On Ozempic for type II DM °

## 2021-09-05 NOTE — Patient Instructions (Signed)
Please continue taking medications as prescribed.  Please continue to follow low carb diet and ambulate as tolerated.  Please get fasting blood tests done before the next visit. 

## 2021-09-11 ENCOUNTER — Other Ambulatory Visit: Payer: Self-pay | Admitting: Nurse Practitioner

## 2021-09-11 ENCOUNTER — Other Ambulatory Visit: Payer: Self-pay | Admitting: Internal Medicine

## 2021-09-14 ENCOUNTER — Ambulatory Visit (INDEPENDENT_AMBULATORY_CARE_PROVIDER_SITE_OTHER): Payer: Medicaid Other

## 2021-09-14 ENCOUNTER — Telehealth: Payer: Self-pay

## 2021-09-14 DIAGNOSIS — Z23 Encounter for immunization: Secondary | ICD-10-CM | POA: Diagnosis not present

## 2021-09-14 NOTE — Telephone Encounter (Signed)
Pt presented in office today for shingles vaccine. She wanted to inquire about getting tdap vaccine due to having an accident and hurting her face on a ride at [employer]. Not showing that she has had it on chart but pt is unaware if she has received it elsewhere.Please advise. ?

## 2021-09-14 NOTE — Telephone Encounter (Signed)
Spoke with pt advised ok to get Tdap pt verbalized understanding ?

## 2021-09-18 ENCOUNTER — Ambulatory Visit (INDEPENDENT_AMBULATORY_CARE_PROVIDER_SITE_OTHER): Payer: Medicaid Other

## 2021-09-18 DIAGNOSIS — Z23 Encounter for immunization: Secondary | ICD-10-CM | POA: Diagnosis not present

## 2021-09-25 ENCOUNTER — Telehealth: Payer: Self-pay

## 2021-09-25 NOTE — Telephone Encounter (Signed)
Started prior authorization for the [employer]. ?Key # [id number] ?

## 2021-09-26 NOTE — Telephone Encounter (Signed)
Received a Denial from [place] for the [place]. I have faxed an appeal to [facility] at [phone]. ?

## 2021-09-26 NOTE — Telephone Encounter (Signed)
Received a fax from [facility] that the [place] have been approved from 09/26/2021 through 09/26/2022. ?Approved J Code: [id number] ?

## 2021-10-06 DIAGNOSIS — L03031 Cellulitis of right toe: Secondary | ICD-10-CM | POA: Diagnosis not present

## 2021-10-06 DIAGNOSIS — L03032 Cellulitis of left toe: Secondary | ICD-10-CM | POA: Diagnosis not present

## 2021-10-06 DIAGNOSIS — B351 Tinea unguium: Secondary | ICD-10-CM | POA: Diagnosis not present

## 2021-10-10 ENCOUNTER — Other Ambulatory Visit: Payer: Self-pay | Admitting: Internal Medicine

## 2021-10-10 ENCOUNTER — Other Ambulatory Visit: Payer: Self-pay | Admitting: Nurse Practitioner

## 2021-10-11 ENCOUNTER — Telehealth: Payer: Self-pay | Admitting: Nurse Practitioner

## 2021-10-11 MED ORDER — SEMAGLUTIDE(0.25 OR 0.5MG/DOS) 2 MG/3ML ~~LOC~~ SOPN: 0.5000 mg | PEN_INJECTOR | SUBCUTANEOUS | 0 refills | Status: DC

## 2021-10-11 NOTE — Telephone Encounter (Signed)
[facility] called and said that the box has changed for ozempic and they need a new RX for 3 ml box instead of 1.5 It will dial up the same she said. The new [place] number is [phone]. Thank you  ?

## 2021-10-11 NOTE — Telephone Encounter (Signed)
Rx sent 

## 2021-10-17 DIAGNOSIS — H95121 Granulation of postmastoidectomy cavity, right ear: Secondary | ICD-10-CM | POA: Diagnosis not present

## 2021-10-19 ENCOUNTER — Other Ambulatory Visit: Payer: Self-pay | Admitting: Nurse Practitioner

## 2021-10-24 ENCOUNTER — Ambulatory Visit: Payer: Medicaid Other | Admitting: Nurse Practitioner

## 2021-11-07 DIAGNOSIS — M545 Low back pain, unspecified: Secondary | ICD-10-CM | POA: Diagnosis not present

## 2021-11-07 DIAGNOSIS — Z79891 Long term (current) use of opiate analgesic: Secondary | ICD-10-CM | POA: Diagnosis not present

## 2021-11-07 DIAGNOSIS — M79673 Pain in unspecified foot: Secondary | ICD-10-CM | POA: Diagnosis not present

## 2021-11-07 DIAGNOSIS — M542 Cervicalgia: Secondary | ICD-10-CM | POA: Diagnosis not present

## 2021-11-08 ENCOUNTER — Other Ambulatory Visit: Payer: Self-pay | Admitting: Internal Medicine

## 2021-11-14 ENCOUNTER — Encounter: Payer: Self-pay | Admitting: Nurse Practitioner

## 2021-11-14 ENCOUNTER — Ambulatory Visit: Payer: Medicaid Other | Admitting: Nurse Practitioner

## 2021-11-14 VITALS — BP 140/84 | HR 68 | Ht 65.0 in | Wt 231.0 lb

## 2021-11-14 DIAGNOSIS — E559 Vitamin D deficiency, unspecified: Secondary | ICD-10-CM | POA: Diagnosis not present

## 2021-11-14 DIAGNOSIS — Z794 Long term (current) use of insulin: Secondary | ICD-10-CM

## 2021-11-14 DIAGNOSIS — E039 Hypothyroidism, unspecified: Secondary | ICD-10-CM | POA: Diagnosis not present

## 2021-11-14 DIAGNOSIS — E782 Mixed hyperlipidemia: Secondary | ICD-10-CM | POA: Diagnosis not present

## 2021-11-14 DIAGNOSIS — E1165 Type 2 diabetes mellitus with hyperglycemia: Secondary | ICD-10-CM | POA: Diagnosis not present

## 2021-11-14 DIAGNOSIS — I10 Essential (primary) hypertension: Secondary | ICD-10-CM | POA: Diagnosis not present

## 2021-11-14 LAB — POCT GLYCOSYLATED HEMOGLOBIN (HGB A1C): HbA1c POC (<> result, manual entry): 8 % (ref 4.0–5.6)

## 2021-11-14 LAB — POCT UA - MICROALBUMIN
Albumin/Creatinine Ratio, Urine, POC: <30
Creatinine, POC: 300 mg/dL
Microalbumin Ur, POC: 30 mg/L

## 2021-11-14 MED ORDER — SEMAGLUTIDE (1 MG/DOSE) 4 MG/3ML ~~LOC~~ SOPN: 1.0000 mg | PEN_INJECTOR | SUBCUTANEOUS | 3 refills | Status: DC

## 2021-11-14 NOTE — Patient Instructions (Signed)
Diabetes Mellitus and Foot Care Foot care is an important part of your health, especially when you have diabetes. Diabetes may cause you to have problems because of poor blood flow (circulation) to your feet and legs, which can cause your skin to: Become thinner and drier. Break more easily. Heal more slowly. Peel and crack. You may also have nerve damage (neuropathy) in your legs and feet, causing decreased feeling in them. This means that you may not notice minor injuries to your feet that could lead to more serious problems. Noticing and addressing any potential problems early is the best way to prevent future foot problems. How to care for your feet Foot hygiene  Wash your feet daily with warm water and mild soap. Do not use hot water. Then, pat your feet and the areas between your toes until they are completely dry. Do not soak your feet as this can dry your skin. Trim your toenails straight across. Do not dig under them or around the cuticle. File the edges of your nails with an emery board or nail file. Apply a moisturizing lotion or petroleum jelly to the skin on your feet and to dry, brittle toenails. Use lotion that does not contain alcohol and is unscented. Do not apply lotion between your toes. Shoes and socks Wear clean socks or stockings every day. Make sure they are not too tight. Do not wear knee-high stockings since they may decrease blood flow to your legs. Wear shoes that fit properly and have enough cushioning. Always look in your shoes before you put them on to be sure there are no objects inside. To break in new shoes, wear them for just a few hours a day. This prevents injuries on your feet. Wounds, scrapes, corns, and calluses  Check your feet daily for blisters, cuts, bruises, sores, and redness. If you cannot see the bottom of your feet, use a mirror or ask someone for help. Do not cut corns or calluses or try to remove them with medicine. If you find a minor scrape,  cut, or break in the skin on your feet, keep it and the skin around it clean and dry. You may clean these areas with mild soap and water. Do not clean the area with peroxide, alcohol, or iodine. If you have a wound, scrape, corn, or callus on your foot, look at it several times a day to make sure it is healing and not infected. Check for: Redness, swelling, or pain. Fluid or blood. Warmth. Pus or a bad smell. General tips Do not cross your legs. This may decrease blood flow to your feet. Do not use heating pads or hot water bottles on your feet. They may burn your skin. If you have lost feeling in your feet or legs, you may not know this is happening until it is too late. Protect your feet from hot and cold by wearing shoes, such as at the beach or on hot pavement. Schedule a complete foot exam at least once a year (annually) or more often if you have foot problems. Report any cuts, sores, or bruises to your health care provider immediately. Where to find more information American Diabetes Association: www.diabetes.org Association of Diabetes Care & Education Specialists: www.diabeteseducator.org Contact a health care provider if: You have a medical condition that increases your risk of infection and you have any cuts, sores, or bruises on your feet. You have an injury that is not healing. You have redness on your legs or feet. You   feel burning or tingling in your legs or feet. You have pain or cramps in your legs and feet. Your legs or feet are numb. Your feet always feel cold. You have pain around any toenails. Get help right away if: You have a wound, scrape, corn, or callus on your foot and: You have pain, swelling, or redness that gets worse. You have fluid or blood coming from the wound, scrape, corn, or callus. Your wound, scrape, corn, or callus feels warm to the touch. You have pus or a bad smell coming from the wound, scrape, corn, or callus. You have a fever. You have a red  line going up your leg. Summary Check your feet every day for blisters, cuts, bruises, sores, and redness. Apply a moisturizing lotion or petroleum jelly to the skin on your feet and to dry, brittle toenails. Wear shoes that fit properly and have enough cushioning. If you have foot problems, report any cuts, sores, or bruises to your health care provider immediately. Schedule a complete foot exam at least once a year (annually) or more often if you have foot problems. This information is not intended to replace advice given to you by your health care provider. Make sure you discuss any questions you have with your health care provider. Document Revised: 12/10/2019 Document Reviewed: 12/10/2019 Elsevier Patient Education  2023 [employer]  

## 2021-11-14 NOTE — Progress Notes (Signed)
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   Anaphylactic shock, unspecified, subsequent encounter    Anemia, iron deficiency 02/14/2015   Angio-edema    Arthritis    Asthma    Asymptomatic varicose veins of right lower extremity    Chronic obstructive pulmonary disease with (acute) exacerbation (HCC)    Chronic rhinitis 05/21/2018   COVID-19    Diabetes mellitus    Essential (primary) hypertension    Family history of colon cancer 09/03/2012   Fatty liver    Gastro-esophageal reflux disease without esophagitis    GERD (gastroesophageal reflux disease)    History of colonic polyps 08/26/2017   Hypercholesteremia    Hypertension    Hypothyroidism    Low back pain    Mass of finger, right s/p surgical excision (mucoid cyst) 04/17/18 04/22/2018   Mucous cyst of digit of right hand    Obesity, unspecified    Other adverse food reactions, not elsewhere classified, initial encounter    Personal history of noncompliance with medical treatment, presenting hazards to health 05/18/2015   Rash and other nonspecific skin eruption    Sleep apnea    pt siad, "i had small amount but could not tolerate CPAP and they said it was ok since it was mild.   Sleep apnea, unspecified    Unspecified asthma, uncomplicated    Zoster without complications    Past Surgical History:  Procedure Laterality Date   ABDOMINAL HYSTERECTOMY     CHOLECYSTECTOMY     COLONOSCOPY N/A 09/22/2012   RMR: colonic polyps -removed as described above. tubular adenoma, next TCS 09/2017   COLONOSCOPY WITH PROPOFOL N/A 09/23/2017   Procedure: COLONOSCOPY WITH PROPOFOL;  Surgeon: Daneil Dolin, MD;  Location: [facility];  Service: Endoscopy;  Laterality: N/A;  7:30am   CYST EXCISION Left  07/19/2016   Procedure: CYST REMOVAL LEFT RING FINGER;  Surgeon: Carole Civil, MD;  Location: [facility];  Service: Orthopedics;  Laterality: Left;   CYST REMOVAL LEG Right    foot   ESOPHAGOGASTRODUODENOSCOPY N/A 10/21/2014   RMR: Small hiatal hernia; otherwise normal EGD status post passage of a Maloney dilator.    EXCISION MASS UPPER EXTREMETIES Right 04/17/2018   Procedure: EXCISION MASS UPPER EXTREMETIES right ring finger;  Surgeon: Carole Civil, MD;  Location: [facility];  Service: Orthopedics;  Laterality: Right;   MALONEY DILATION N/A 10/21/2014   Procedure: [name] DILATION;  Surgeon: Daneil Dolin, MD;  Location: [facility];  Service: Endoscopy;  Laterality: N/A;   MIDDLE EAR SURGERY Right    patched hole in ear drum   POLYPECTOMY  09/23/2017   Procedure: POLYPECTOMY;  Surgeon: Daneil Dolin, MD;  Location: [facility];  Service: Endoscopy;;  polyp hepatic flexure polyp cs, splenic flexure polyp cs, rectal polyp cs   Social History   Socioeconomic History   Marital status: Divorced    Spouse name: Not on file   Number of children: 2   Years of education: Not on file   Highest education level: Not on file  Occupational History   Not on file  Tobacco Use   Smoking status: Never   Smokeless tobacco: Never  Vaping Use   Vaping Use:  Never used  Substance and Sexual Activity   Alcohol use: No    Alcohol/week: 0.0 standard drinks of alcohol   Drug use: No   Sexual activity: Yes    Birth control/protection: Surgical  Other Topics Concern   Not on file  Social History Narrative   Lives with grandson (since born)      Enjoys: vacation, travel, concerts      Diet: eats all food groups    Caffeine: diet Pepsi daily    Water:  8 cups daily       Wears seat belt    Does use phone   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    Social Determinants of Health   Financial Resource Strain: Low Risk  (12/02/2019)   Overall Financial Resource Strain (CARDIA)     Difficulty of Paying Living Expenses: Not hard at all  Food Insecurity: No Food Insecurity (12/02/2019)   Hunger Vital Sign    Worried About Running Out of Food in the Last Year: Never true    [place] in the Last Year: Never true  Transportation Needs: No Transportation Needs (12/02/2019)   PRAPARE - Hydrologist (Medical): No    Lack of Transportation (Non-Medical): No  Physical Activity: Insufficiently Active (12/02/2019)   Exercise Vital Sign    Days of Exercise per Week: 2 days    Minutes of Exercise per Session: 20 min  Stress: Not on file  Social Connections: Moderately Isolated (12/02/2019)   Social Connection and Isolation Panel [NHANES]    Frequency of Communication with Friends and Family: More than three times a week    Frequency of Social Gatherings with Friends and Family: More than three times a week    Attends Religious Services: More than 4 times per year    Active Member of [employer] or Organizations: No    Attends Archivist Meetings: Never    Marital Status: Divorced   Outpatient Encounter Medications as of 11/14/2021  Medication Sig   Semaglutide, 1 MG/DOSE, 4 MG/3ML SOPN Inject 1 mg as directed once a week.   Accu-Chek FastClix Lancets MISC USE TO TEST 4 TIMES DAILY.   ACCU-CHEK GUIDE test strip USE TO TEST 4 TIMES DAILY.   aspirin EC 81 MG tablet Take 81 mg by mouth daily.   cetirizine (ZYRTEC) 10 MG tablet TAKE ONE TABLET ('10MG'$  TOTAL) BY MOUTH DAILY   Cholecalciferol (VITAMIN D-3) 1000 UNITS CAPS Take 1,000 Units by mouth daily.    Continuous Blood Gluc Sensor (DEXCOM G6 SENSOR) MISC Change sensor every 10 days as directed   Continuous Blood Gluc Transmit (DEXCOM G6 TRANSMITTER) MISC Change transmitter every 90 days as directed.   Diclofenac Sodium 3 % GEL APPLY 2 GRAMS TO AFFECTED AREAS 2 TIMES A DAY AS NEEDED   EPINEPHrine 0.3 mg/0.3 mL IJ SOAJ injection Inject 0.3 mg into the muscle as needed for anaphylaxis.    fluticasone (FLONASE) 50 MCG/ACT nasal spray Place 1 spray into both nostrils daily.   HYDROcodone-acetaminophen (NORCO) 7.5-325 MG tablet Take by mouth.   ketoconazole (NIZORAL) 2 % cream APPLY TO AFFECTED AREAS TWICE DAILY   LANTUS SOLOSTAR 100 UNIT/ML Solostar Pen INJECT 80 UNITS SUBCUTANEOUSLY INTO THE SKIN DAILY. (Patient taking differently: 70 Units at bedtime.)   levothyroxine (SYNTHROID) 75 MCG tablet TAKE ONE (1) TABLET BY MOUTH EVERY DAY BEFORE BREAKFAST   lisinopril (ZESTRIL) 20 MG tablet TAKE ONE TABLET ('20MG'$  TOTAL) BY MOUTH  DAILY   LITETOUCH PEN NEEDLES 31G X 8 MM MISC USE AS DIRECTED FOUR TIMES DAILY.   metFORMIN (GLUCOPHAGE-XR) 500 MG 24 hr tablet TAKE ONE TABLET BY MOUTH TWICE A DAY AFTER A MEAL   NOVOLOG FLEXPEN 100 UNIT/ML FlexPen INJECT 25 UNITS TO 31 UNITS INTO THE SKIN THREE TIMES DAILY WITH MEALS.   pantoprazole (PROTONIX) 40 MG tablet TAKE ONE TABLET ('40MG'$  TOTAL) BY MOUTH DAILY   pregabalin (LYRICA) 150 MG capsule Take 150 mg by mouth 2 (two) times daily.   PROAIR HFA 108 (90 Base) MCG/ACT inhaler INHALE 4 PUFFS INTO THE LUNGS EVERY 6 HOURS AS NEEDED FOR WHEEZING OR SHORTNESS OF BREATH   triamcinolone cream (KENALOG) 0.1 % Apply 1 application topically 2 (two) times daily.   vitamin B-12 (CYANOCOBALAMIN) 500 MCG tablet Take 500 mcg by mouth daily.   [DISCONTINUED] glipiZIDE (GLUCOTROL XL) 10 MG 24 hr tablet TAKE ONE TABLET ('10MG'$  TOTAL) BY MOUTH DAILY WITH BREAKFAST   [DISCONTINUED] Semaglutide,0.25 or 0.'5MG'$ /DOS, 2 MG/3ML SOPN Inject 0.5 mg into the skin once a week.   No facility-administered encounter medications on file as of 11/14/2021.   ALLERGIES: Allergies  Allergen Reactions   Kiwi Extract Anaphylaxis, Swelling and Palpitations   Other Anaphylaxis    Walnuts (avoids all tree nuts)   VACCINATION STATUS: Immunization History  Administered Date(s) Administered   Influenza,inj,Quad PF,6+ Mos 04/15/2017, 03/30/2019   Influenza-Unspecified 04/17/2011, 03/24/2012,  05/12/2013, 02/23/2015, 04/04/2017, 04/08/2018   Moderna Sars-Covid-2 Vaccination 10/22/2019, 11/19/2019   Tdap 09/18/2021   Zoster Recombinat (Shingrix) 09/14/2021    Diabetes She presents for her follow-up diabetic visit. She has type 2 diabetes mellitus. Onset time: She was diagnosed at approximate age of 18 years. Her disease course has been improving. Hypoglycemia symptoms include hunger and tremors. Pertinent negatives for hypoglycemia include no confusion, nervousness/anxiousness, pallor or seizures. Associated symptoms include weight loss. Pertinent negatives for diabetes include no fatigue, no polydipsia, no polyphagia and no polyuria. There are no hypoglycemic complications. Symptoms are stable. There are no diabetic complications. Risk factors for coronary artery disease include dyslipidemia, diabetes mellitus, hypertension, sedentary lifestyle, obesity, post-menopausal and stress. Current diabetic treatment includes oral agent (dual therapy) and intensive insulin program (and Ozempic). She is compliant with treatment most of the time. Her weight is decreasing steadily. She is following a generally healthy diet. When asked about meal planning, she reported none. She has had a previous visit with a dietitian. She never participates in exercise. Her home blood glucose trend is decreasing steadily. Her overall blood glucose range is 180-200 mg/dl. (She presents today with her CGM showing improving glycemic profile overall.  Her POCT A1c today is 8%, improving from last visit of 8.3%.  She has lost another 5 lbs since last visit.  She is continuing to work on diet, has cut out sodas altogether.  Analysis of her CGM shows TIR 56%, TAR 44%, TBR 0%.  She denies any significant hypoglycemia but has noticed some afternoon drops in glucose.) An ACE inhibitor/angiotensin II receptor blocker is being taken. She sees a podiatrist.Eye exam is current.  Hyperlipidemia This is a chronic problem. The current  episode started more than 1 year ago. The problem is controlled. Recent lipid tests were reviewed and are normal. Exacerbating diseases include diabetes, hypothyroidism and obesity. There are no known factors aggravating her hyperlipidemia. Pertinent negatives include no myalgias. Current antihyperlipidemic treatment includes statins. The current treatment provides significant improvement of lipids. Compliance problems include medication cost, adherence to diet and adherence to  exercise.  Risk factors for coronary artery disease include diabetes mellitus, dyslipidemia, hypertension, obesity, a sedentary lifestyle and post-menopausal.  Hypertension This is a chronic problem. The current episode started more than 1 year ago. The problem has been gradually improving since onset. The problem is controlled. Pertinent negatives include no palpitations. Agents associated with hypertension include thyroid hormones. Risk factors for coronary artery disease include diabetes mellitus, dyslipidemia, obesity, post-menopausal state, sedentary lifestyle and stress. Past treatments include ACE inhibitors. The current treatment provides mild improvement. Compliance problems include diet and exercise.  Identifiable causes of hypertension include sleep apnea and a thyroid problem.  Thyroid Problem Presents for follow-up visit. Symptoms include tremors and weight loss. Patient reports no anxiety, cold intolerance, constipation, depressed mood, diarrhea, dry skin, fatigue, hair loss, heat intolerance, palpitations or weight gain. The symptoms have been stable. Her past medical history is significant for diabetes and hyperlipidemia.     Review of systems  Constitutional: + steadily decreasing body weight,  current Body mass index is 38.44 kg/m. , no fatigue, no subjective hyperthermia, no subjective hypothermia Eyes: no blurry vision, no xerophthalmia ENT: no sore throat, no nodules palpated in throat, no  dysphagia/odynophagia, no hoarseness Cardiovascular: no chest pain, no shortness of breath, no palpitations, no leg swelling Respiratory: no cough, no shortness of breath Gastrointestinal: no nausea/vomiting/diarrhea Musculoskeletal: no muscle/joint aches Skin: no rashes, no hyperemia Neurological: no tremors, no numbness, no tingling, no dizziness Psychiatric: no depression, no anxiety   Objective:    BP 140/84   Pulse 68   Ht '5\' 5"'$  (1.651 m)   Wt 231 lb (104.8 kg)   BMI 38.44 kg/m   Wt Readings from Last 3 Encounters:  11/14/21 231 lb (104.8 kg)  09/05/21 236 lb 3.2 oz (107.1 kg)  07/26/21 247 lb 3.2 oz (112.1 kg)    BP Readings from Last 3 Encounters:  11/14/21 140/84  09/05/21 (!) 142/92  07/26/21 (!) 174/75     Physical Exam- Limited  Constitutional:  Body mass index is 38.44 kg/m. , not in acute distress, normal state of mind Eyes:  EOMI, no exophthalmos Neck: Supple Cardiovascular: RRR, no murmurs, rubs, or gallops, no edema Respiratory: Adequate breathing efforts, no crackles, rales, rhonchi, or wheezing Musculoskeletal: no gross deformities, strength intact in all four extremities, no gross restriction of joint movements Skin:  no rashes, no hyperemia Neurological: no tremor with outstretched hands    Lipid Panel     Component Value Date/Time   CHOL 151 01/09/2021 0828   TRIG 89 01/09/2021 0828   HDL 50 01/09/2021 0828   CHOLHDL 3.0 01/09/2021 0828   CHOLHDL 2.4 12/25/2019 0709   VLDL 10 03/07/2016 0705   LDLCALC 84 01/09/2021 0828   LDLCALC 65 12/25/2019 0709    Recent Results (from the past 2160 hour(s))  HgB A1c     Status: Abnormal   Collection Time: 11/14/21  8:33 AM  Result Value Ref Range   Hemoglobin A1C     HbA1c POC (<> result, manual entry) 8.0 4.0 - 5.6 %   HbA1c, POC (prediabetic range)     HbA1c, POC (controlled diabetic range)    POCT UA - Microalbumin     Status: Normal   Collection Time: 11/14/21  8:35 AM  Result Value Ref  Range   Microalbumin Ur, POC 30 mg/L   Creatinine, POC 300 mg/dL   Albumin/Creatinine Ratio, Urine, POC <30        Assessment & Plan:   1) Uncontrolled type 2  diabetes mellitus with complication, with long-term current use of insulin ([place])  She presents today with her CGM showing improving glycemic profile overall.  Her POCT A1c today is 8%, improving from last visit of 8.3%.  She has lost another 5 lbs since last visit.  She is continuing to work on diet, has cut out sodas altogether.  Analysis of her CGM shows TIR 56%, TAR 44%, TBR 0%.  She denies any significant hypoglycemia but has noticed some afternoon drops in glucose.  -Her diabetes is complicated by history of noncompliance (she recently showed better engagement) and patient remains at extremely high risk for more acute and chronic complications of diabetes which include CAD, CVA, CKD, retinopathy, and neuropathy. These are all discussed in detail with the patient.  - Nutritional counseling repeated at each appointment due to patients tendency to fall back in to old habits.  - The patient admits there is a room for improvement in their diet and drink choices. -  Suggestion is made for the patient to avoid simple carbohydrates from their diet including Cakes, Sweet Desserts / Pastries, Ice Cream, Soda (diet and regular), Sweet Tea, Candies, Chips, Cookies, Sweet Pastries, Store Bought Juices, Alcohol in Excess of 1-2 drinks a day, Artificial Sweeteners, Coffee Creamer, and "Sugar-free" Products. This will help patient to have stable blood glucose profile and potentially avoid unintended weight gain.   - I encouraged the patient to switch to unprocessed or minimally processed complex starch and increased protein intake (animal or plant source), fruits, and vegetables.   - Patient is advised to stick to a routine mealtimes to eat 3 meals a day and avoid unnecessary snacks (to snack only to correct hypoglycemia).  - I have approached  patient with the following individualized plan to manage diabetes and patient agrees.  -She will continue to require intensive treatment with  basal/bolus insulin in order for her to achieve and maintain control of diabetes to target.  -She is advised to continue her Lantus 70 units SQ nightly, Novolog 25-31 units TID with meals if glucose is above 90 and she is eating (Specific instructions on how to titrate insulin dosage based on glucose readings given to patient in writing), and continue Metformin 500 mg ER BID with meals.  I increased her dose of Ozempic to 1 mg SQ weekly (can double up on her 0.5 mg injections until she depletes her current supply).  She is advised to stop her Glipizide for now.   -She is encouraged to continue monitoring blood glucose 4 times daily-using her CGM, before meals and before bed, and to call the clinic if she has readings less than 70 or greater than 300 for 3 tests in a row.  She has benefited from CGM device and is advised to continue.  2) Lipids/HPL:  Her most recent lipid panel from 01/09/21 shows controlled LDL of 84.  She was taken off her Statin due to elevated liver enzymes- which are now improving steadily.  Side effects and precautions discussed with her.  Will recheck lipid panel prior to next visit.  3) Hypertension:  Her blood pressure is not controlled to target but is slowly improving.  She is advised to continue Lisinopril 20 mg po daily (managed by her PCP).  4) Hypothyroidism:  -There are no recent TFTs to review. She is advised to continue her current dose of Levothyroxine 75 mcg po daily before breakfast (skipping 2 days out of the week).    - We discussed about the correct  intake of her thyroid hormone, on empty stomach at fasting, with water, separated by at least 30 minutes from breakfast and other medications,  and separated by more than 4 hours from calcium, iron, multivitamins, acid reflux medications (PPIs). -Patient is made aware of the  fact that thyroid hormone replacement is needed for life, dose to be adjusted by periodic monitoring of thyroid function tests.    - I advised patient to maintain close follow up with Lindell Spar, MD for primary care needs.     I spent 43 minutes in the care of the patient today including review of labs from [place], Lipids, Thyroid Function, Hematology (current and previous including abstractions from other facilities); face-to-face time discussing  her blood glucose readings/logs, discussing hypoglycemia and hyperglycemia episodes and symptoms, medications doses, her options of short and long term treatment based on the latest standards of care / guidelines;  discussion about incorporating lifestyle medicine;  and documenting the encounter.    Please refer to Patient Instructions for Blood Glucose Monitoring and Insulin/Medications Dosing Guide"  in media tab for additional information. Please  also refer to " Patient Self Inventory" in the Media  tab for reviewed elements of pertinent patient history.  Gillian Scarce participated in the discussions, expressed understanding, and voiced agreement with the above plans.  All questions were answered to her satisfaction. she is encouraged to contact clinic should she have any questions or concerns prior to her return visit.    Follow up plan: -Return in about 3 months (around 02/14/2022) for Diabetes F/U with A1c in office, Previsit labs, Thyroid follow up, Bring meter and logs.  Rayetta Pigg, [facility] [address] [place], [place] [address] Phone: [phone] Fax: [phone]   11/14/2021, 8:59 AM

## 2021-11-15 ENCOUNTER — Other Ambulatory Visit: Payer: Self-pay | Admitting: Internal Medicine

## 2021-11-20 ENCOUNTER — Other Ambulatory Visit: Payer: Self-pay | Admitting: Family

## 2021-11-20 ENCOUNTER — Other Ambulatory Visit: Payer: Self-pay | Admitting: Allergy & Immunology

## 2021-12-06 ENCOUNTER — Other Ambulatory Visit (HOSPITAL_COMMUNITY): Payer: Self-pay | Admitting: Internal Medicine

## 2021-12-06 DIAGNOSIS — Z1231 Encounter for screening mammogram for malignant neoplasm of breast: Secondary | ICD-10-CM

## 2021-12-11 ENCOUNTER — Other Ambulatory Visit: Payer: Self-pay | Admitting: Internal Medicine

## 2021-12-19 ENCOUNTER — Other Ambulatory Visit: Payer: Self-pay | Admitting: Nurse Practitioner

## 2022-01-02 LAB — HM DIABETES EYE EXAM

## 2022-01-03 ENCOUNTER — Encounter: Payer: Self-pay | Admitting: *Deleted

## 2022-01-05 DIAGNOSIS — B351 Tinea unguium: Secondary | ICD-10-CM | POA: Diagnosis not present

## 2022-01-05 DIAGNOSIS — L03032 Cellulitis of left toe: Secondary | ICD-10-CM | POA: Diagnosis not present

## 2022-01-05 DIAGNOSIS — L600 Ingrowing nail: Secondary | ICD-10-CM | POA: Diagnosis not present

## 2022-01-05 DIAGNOSIS — L03031 Cellulitis of right toe: Secondary | ICD-10-CM | POA: Diagnosis not present

## 2022-01-11 ENCOUNTER — Ambulatory Visit (HOSPITAL_COMMUNITY)
Admission: RE | Admit: 2022-01-11 | Discharge: 2022-01-11 | Disposition: A | Discharge status: Home / Self Care | Payer: Medicaid Other | Source: Ambulatory Visit | Attending: Internal Medicine | Admitting: Internal Medicine

## 2022-01-11 DIAGNOSIS — Z1231 Encounter for screening mammogram for malignant neoplasm of breast: Secondary | ICD-10-CM | POA: Insufficient documentation

## 2022-01-16 ENCOUNTER — Encounter: Payer: Self-pay | Admitting: Internal Medicine

## 2022-01-16 ENCOUNTER — Ambulatory Visit (INDEPENDENT_AMBULATORY_CARE_PROVIDER_SITE_OTHER): Payer: Medicaid Other | Admitting: Internal Medicine

## 2022-01-16 VITALS — BP 138/88 | HR 70 | Resp 18 | Ht 65.0 in | Wt 224.8 lb

## 2022-01-16 DIAGNOSIS — E1165 Type 2 diabetes mellitus with hyperglycemia: Secondary | ICD-10-CM | POA: Diagnosis not present

## 2022-01-16 DIAGNOSIS — Z114 Encounter for screening for human immunodeficiency virus [HIV]: Secondary | ICD-10-CM | POA: Diagnosis not present

## 2022-01-16 DIAGNOSIS — E782 Mixed hyperlipidemia: Secondary | ICD-10-CM | POA: Diagnosis not present

## 2022-01-16 DIAGNOSIS — Z0001 Encounter for general adult medical examination with abnormal findings: Secondary | ICD-10-CM | POA: Diagnosis not present

## 2022-01-16 DIAGNOSIS — E559 Vitamin D deficiency, unspecified: Secondary | ICD-10-CM | POA: Diagnosis not present

## 2022-01-16 DIAGNOSIS — Z789 Other specified health status: Secondary | ICD-10-CM

## 2022-01-16 DIAGNOSIS — Z794 Long term (current) use of insulin: Secondary | ICD-10-CM | POA: Diagnosis not present

## 2022-01-16 DIAGNOSIS — E039 Hypothyroidism, unspecified: Secondary | ICD-10-CM

## 2022-01-16 DIAGNOSIS — M47816 Spondylosis without myelopathy or radiculopathy, lumbar region: Secondary | ICD-10-CM

## 2022-01-16 DIAGNOSIS — Z23 Encounter for immunization: Secondary | ICD-10-CM | POA: Diagnosis not present

## 2022-01-16 DIAGNOSIS — Z90710 Acquired absence of both cervix and uterus: Secondary | ICD-10-CM

## 2022-01-16 DIAGNOSIS — I10 Essential (primary) hypertension: Secondary | ICD-10-CM

## 2022-01-16 DIAGNOSIS — Z1159 Encounter for screening for other viral diseases: Secondary | ICD-10-CM | POA: Diagnosis not present

## 2022-01-16 NOTE — Patient Instructions (Signed)
Please continue taking medications as prescribed.  Please continue to follow low carb diet and ambulate as tolerated.  You are being referred to Spine surgeon.

## 2022-01-16 NOTE — Progress Notes (Signed)
Established Patient Office Visit  Subjective:  Patient ID: Sandy Valencia, female    DOB: 11/20/1960  Age: 61 y.o. MRN: [id number]  CC:  Chief Complaint  Patient presents with   Annual Exam    Annual exam back has been giving her a fit. Has been hurting over 4 months now has nerve pain down leg also fell a few weeks back and hurt right arm    HPI Sandy Valencia is a 61 y.o. female with past medical history of HTN, DM, hypothyroidism, NAFLD, GERD, chronic pain syndrome and morbid obesity who presents for annual physical.  She complains of acute on chronic low back pain, which is worse for the last few weeks, which is sharp, 10/10 and worse with movement. She had a fall at home, likely mechanical, and had right arm injury.  Her right arm pain has improved now.  Denies any bruising currently. Of note, she has history of lumbar spondylosis, and takes Lyrica and Norco for it, followed by [facility] neurology.  She currently denies any numbness or weakness of the LE.  Denies any saddle anesthesia, urinary or stool incontinence.  Her BP usually remains well controlled at home with lisinopril.  She denies any headache, dizziness, chest pain, dyspnea or palpitations.  Type II DM: Followed by endocrinology. She takes Ozempic 1 mg qw now. She takes Lantus 70 units nightly and takes NovoLog ISS in addition to metformin and glipizide.  Her blood glucose has been ranging around 120-200 most of the time, she has Dexcom currently.  She has lost about 12 lbs since the last visit.   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   Anaphylactic shock, unspecified, subsequent encounter    Anemia, iron deficiency 02/14/2015   Angio-edema    Arthritis    Asthma    Asymptomatic varicose veins of right lower extremity    Chronic obstructive pulmonary disease with (acute) exacerbation (HCC)    Chronic rhinitis 05/21/2018    COVID-19    Diabetes mellitus    Essential (primary) hypertension    Family history of colon cancer 09/03/2012   Fatty liver    Gastro-esophageal reflux disease without esophagitis    GERD (gastroesophageal reflux disease)    History of colonic polyps 08/26/2017   Hypercholesteremia    Hypertension    Hypothyroidism    Low back pain    Mass of finger, right s/p surgical excision (mucoid cyst) 04/17/18 04/22/2018   Mucous cyst of digit of right hand    Obesity, unspecified    Other adverse food reactions, not elsewhere classified, initial encounter    Personal history of noncompliance with medical treatment, presenting hazards to health 05/18/2015   Rash and other nonspecific skin eruption    Sleep apnea    pt siad, "i had small amount but could not tolerate CPAP and they said it was ok since it was mild.   Sleep apnea, unspecified    Unspecified asthma, uncomplicated    Zoster without complications     Past Surgical History:  Procedure Laterality Date   ABDOMINAL HYSTERECTOMY     CHOLECYSTECTOMY     COLONOSCOPY N/A 09/22/2012   RMR: colonic polyps -removed as described above. tubular adenoma, next TCS 09/2017   COLONOSCOPY WITH PROPOFOL N/A 09/23/2017   Procedure: COLONOSCOPY WITH PROPOFOL;  Surgeon: Daneil Dolin, MD;  [facility]: [facility];  Service: Endoscopy;  Laterality: N/A;  7:30am   CYST EXCISION Left 07/19/2016   Procedure: CYST REMOVAL LEFT RING FINGER;  Surgeon: Carole Civil, MD;  Location: [facility];  Service: Orthopedics;  Laterality: Left;   CYST REMOVAL LEG Right    foot   ESOPHAGOGASTRODUODENOSCOPY N/A 10/21/2014   RMR: Small hiatal hernia; otherwise normal EGD status post passage of a Maloney dilator.    EXCISION MASS UPPER EXTREMETIES Right 04/17/2018   Procedure: EXCISION MASS UPPER EXTREMETIES right ring finger;  Surgeon: Carole Civil, MD;  Location: [facility];  Service: Orthopedics;  Laterality: Right;   MALONEY DILATION N/A 10/21/2014   Procedure:  [name] DILATION;  Surgeon: Daneil Dolin, MD;  Location: [facility];  Service: Endoscopy;  Laterality: N/A;   MIDDLE EAR SURGERY Right    patched hole in ear drum   POLYPECTOMY  09/23/2017   Procedure: POLYPECTOMY;  Surgeon: Daneil Dolin, MD;  Location: [facility];  Service: Endoscopy;;  polyp hepatic flexure polyp cs, splenic flexure polyp cs, rectal polyp cs    Family History  Problem Relation Age of Onset   Colon cancer Mother        diagnosed age 54, underwent surgical resection, metastatic disease several years later   Diabetes Mother    Heart attack Father    Diabetes Sister    Hypothyroidism Brother    Diabetes Sister    Diabetes Sister    Diabetes Sister    Hypothyroidism Sister    Allergic rhinitis Neg Hx    Angioedema Neg Hx    Asthma Neg Hx    Atopy Neg Hx    Eczema Neg Hx    Immunodeficiency Neg Hx    Urticaria Neg Hx     Social History   Socioeconomic History   Marital status: Divorced    Spouse name: Not on file   Number of children: 2   Years of education: Not on file   Highest education level: Not on file  Occupational History   Not on file  Tobacco Use   Smoking status: Never   Smokeless tobacco: Never  Vaping Use   Vaping Use: Never used  Substance and Sexual Activity   Alcohol use: No    Alcohol/week: 0.0 standard drinks of alcohol   Drug use: No   Sexual activity: Yes    Birth control/protection: Surgical  Other Topics Concern   Not on file  Social History Narrative   Lives with grandson (since born)      Enjoys: vacation, travel, concerts      Diet: eats all food groups    Caffeine: diet Pepsi daily    Water:  8 cups daily       Wears seat belt    Does use phone   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    Social Determinants of Health   Financial Resource Strain: Low Risk  (12/02/2019)   Overall Financial Resource Strain (CARDIA)    Difficulty of Paying Living Expenses: Not hard at all  Food Insecurity: No Food  Insecurity (12/02/2019)   Hunger Vital Sign    Worried About Running Out of Food in the Last Year: Never true    Ran Out of Food in the Last Year: Never true  Transportation Needs: No Transportation Needs (12/02/2019)   PRAPARE - Hydrologist (Medical): No    Lack of Transportation (Non-Medical): No  Physical Activity: Insufficiently Active (12/02/2019)   Exercise Vital  Sign    Days of Exercise per Week: 2 days    Minutes of Exercise per Session: 20 min  Stress: Not on file  Social Connections: Moderately Isolated (12/02/2019)   Social Connection and Isolation Panel [NHANES]    Frequency of Communication with Friends and Family: More than three times a week    Frequency of Social Gatherings with Friends and Family: More than three times a week    Attends Religious Services: More than 4 times per year    Active Member of [employer] or Organizations: No    Attends Archivist Meetings: Never    Marital Status: Divorced  Human resources officer Violence: Not At Risk (12/02/2019)   Humiliation, Afraid, Rape, and Kick questionnaire    Fear of Current or Ex-Partner: No    Emotionally Abused: No    Physically Abused: No    Sexually Abused: No    Outpatient Medications Prior to Visit  Medication Sig Dispense Refill   Accu-Chek FastClix Lancets MISC USE TO TEST 4 TIMES DAILY. 102 each 0   ACCU-CHEK GUIDE test strip USE TO TEST 4 TIMES DAILY. 100 strip 0   aspirin EC 81 MG tablet Take 81 mg by mouth daily.     Cholecalciferol (VITAMIN D-3) 1000 UNITS CAPS Take 1,000 Units by mouth daily.      Continuous Blood Gluc Sensor (DEXCOM G6 SENSOR) MISC Change sensor every 10 days as directed 9 each 3   Continuous Blood Gluc Transmit (DEXCOM G6 TRANSMITTER) MISC Change transmitter every 90 days as directed. 1 each 3   diclofenac Sodium (VOLTAREN) 1 % GEL APPLY 2 GRAMS TO AFFECTED AREAS 2 TIMES A DAY AS NEEDED 100 g 0   EPINEPHrine 0.3 mg/0.3 mL IJ SOAJ injection Inject 0.3 mg  into the muscle as needed for anaphylaxis. 2 each 1   fluticasone (FLONASE) 50 MCG/ACT nasal spray Place 1 spray into both nostrils daily. 16 g 3   HYDROcodone-acetaminophen (NORCO) 7.5-325 MG tablet Take by mouth.     ketoconazole (NIZORAL) 2 % cream APPLY TO AFFECTED AREAS TWICE DAILY 60 g 2   LANTUS SOLOSTAR 100 UNIT/ML Solostar Pen INJECT 80 UNITS SUBCUTANEOUSLY INTO THE SKIN DAILY. (Patient taking differently: 70 Units at bedtime.) 30 mL 2   lisinopril (ZESTRIL) 20 MG tablet TAKE ONE TABLET (20MG TOTAL) BY MOUTH DAILY 90 tablet 1   LITETOUCH PEN NEEDLES 31G X 8 MM MISC USE AS DIRECTED FOUR TIMES DAILY. 100 each 0   NOVOLOG FLEXPEN 100 UNIT/ML FlexPen INJECT 25 UNITS TO 31 UNITS INTO THE SKIN THREE TIMES DAILY WITH MEALS. 60 mL 1   pantoprazole (PROTONIX) 40 MG tablet TAKE ONE TABLET (40MG TOTAL) BY MOUTH DAILY 30 tablet 4   pregabalin (LYRICA) 150 MG capsule Take 150 mg by mouth 2 (two) times daily.     PROAIR HFA 108 (90 Base) MCG/ACT inhaler INHALE 4 PUFFS INTO THE LUNGS EVERY 6 HOURS AS NEEDED FOR WHEEZING OR SHORTNESS OF BREATH 18 g 1   Semaglutide, 1 MG/DOSE, 4 MG/3ML SOPN Inject 1 mg as directed once a week. 3 mL 3   triamcinolone cream (KENALOG) 0.1 % Apply 1 application topically 2 (two) times daily. 30 g 0   vitamin B-12 (CYANOCOBALAMIN) 500 MCG tablet Take 500 mcg by mouth daily.     cetirizine (ZYRTEC) 10 MG tablet TAKE ONE TABLET (10MG TOTAL) BY MOUTH DAILY 30 tablet 3   levothyroxine (SYNTHROID) 75 MCG tablet TAKE ONE (1) TABLET BY MOUTH EVERY DAY BEFORE BREAKFAST  90 tablet 0   metFORMIN (GLUCOPHAGE-XR) 500 MG 24 hr tablet TAKE ONE TABLET BY MOUTH TWICE A DAY AFTER A MEAL 180 tablet 0   No facility-administered medications prior to visit.    Allergies  Allergen Reactions   Kiwi Extract Anaphylaxis, Swelling and Palpitations   Other Anaphylaxis    Walnuts (avoids all tree nuts)    ROS Review of Systems  Constitutional:  Negative for chills and fever.  HENT:  Negative  for congestion, sinus pressure, sinus pain and sore throat.   Eyes:  Negative for pain and discharge.  Respiratory:  Negative for cough and shortness of breath.   Cardiovascular:  Negative for chest pain and palpitations.  Gastrointestinal:  Negative for abdominal pain, diarrhea, nausea and vomiting.  Endocrine: Negative for polydipsia and polyuria.  Genitourinary:  Negative for dysuria and hematuria.  Musculoskeletal:  Positive for arthralgias and back pain. Negative for neck pain and neck stiffness.  Skin:  Positive for color change (Mole over back).  Neurological:  Negative for dizziness and weakness.  Psychiatric/Behavioral:  Negative for agitation and behavioral problems.       Objective:    Physical Exam Vitals reviewed.  Constitutional:      General: She is not in acute distress.    Appearance: She is obese. She is not diaphoretic.  HENT:     Head: Normocephalic and atraumatic.     Nose: Nose normal. No congestion.     Mouth/Throat:     Mouth: Mucous membranes are moist.     Pharynx: No posterior oropharyngeal erythema.  Eyes:     General: No scleral icterus.    Extraocular Movements: Extraocular movements intact.  Cardiovascular:     Rate and Rhythm: Normal rate and regular rhythm.     Pulses: Normal pulses.     Heart sounds: Normal heart sounds. No murmur heard. Pulmonary:     Breath sounds: Normal breath sounds. No wheezing or rales.  Abdominal:     Palpations: Abdomen is soft.     Tenderness: There is no abdominal tenderness.  Musculoskeletal:     Cervical back: Neck supple. No tenderness.     Right lower leg: No edema.     Left lower leg: No edema.  Skin:    General: Skin is warm.     Findings: Lesion (Brownish mole, circular, about 1 cm in diameter, regular margins - over back) present.  Neurological:     General: No focal deficit present.     Mental Status: She is alert and oriented to person, place, and time.     Sensory: No sensory deficit.     Motor:  No weakness.  Psychiatric:        Mood and Affect: Mood normal.        Behavior: Behavior normal.     BP 138/88 (BP Location: Right Arm, Patient Position: Sitting, Cuff Size: Normal)   Pulse 70   Resp 18   Ht '5\' 5"'  (1.651 m)   Wt 224 lb 12.8 oz (102 kg)   SpO2 97%   BMI 37.41 kg/m  Wt Readings from Last 3 Encounters:  01/16/22 224 lb 12.8 oz (102 kg)  11/14/21 231 lb (104.8 kg)  09/05/21 236 lb 3.2 oz (107.1 kg)    Lab Results  Component Value Date   TSH 1.050 01/16/2022   Lab Results  Component Value Date   WBC 5.4 01/16/2022   HGB 14.0 01/16/2022   HCT 41.7 01/16/2022   MCV 86 01/16/2022  PLT 260 01/16/2022   Lab Results  Component Value Date   NA 140 01/16/2022   K 4.1 01/16/2022   CO2 20 01/16/2022   GLUCOSE 198 (H) 01/16/2022   BUN 13 01/16/2022   CREATININE 0.76 01/16/2022   BILITOT 0.4 01/16/2022   ALKPHOS 74 01/16/2022   AST 31 01/16/2022   ALT 40 (H) 01/16/2022   PROT 7.1 01/16/2022   ALBUMIN 4.6 01/16/2022   CALCIUM 9.8 01/16/2022   ANIONGAP 11 04/22/2018   EGFR 90 01/16/2022   Lab Results  Component Value Date   CHOL 160 01/16/2022   Lab Results  Component Value Date   HDL 55 01/16/2022   Lab Results  Component Value Date   LDLCALC 91 01/16/2022   Lab Results  Component Value Date   TRIG 71 01/16/2022   Lab Results  Component Value Date   CHOLHDL 2.9 01/16/2022   Lab Results  Component Value Date   HGBA1C 8.0 11/14/2021      Assessment & Plan:   Problem List Items Addressed This Visit       Cardiovascular and Mediastinum   Essential hypertension    BP Readings from Last 1 Encounters:  01/16/22 138/88  Usually well-controlled with Lisinopril 20 mg QD now Counseled for compliance with the medications Advised DASH diet and moderate exercise/walking, at least 150 mins/week        Endocrine   Type 2 diabetes mellitus with hyperglycemia ([place])    Lab Results  Component Value Date   HGBA1C 8.0 11/14/2021  Follows up  with Endocrinology On Lantus 70 units and NovoLog ISS Continue metformin and glipizide Tried to give her statin in the past, had transaminitis -will try again to add statin later Takes Lyrica for neuropathy/low back pain      Hypothyroidism    Lab Results  Component Value Date   TSH 1.050 01/16/2022  On levothyroxine 75 mcg QD, followed by Endocrinology      Relevant Orders   TSH + free T4     Musculoskeletal and Integument   Lumbar spondylosis    Last MRI of lumbar spine in 2004 Currently on Lyrica and as needed Norco, followed by pain clinic -[facility] neurology Acute flareup of low back pain Simple back exercises Spine surgery referral provided      Relevant Orders   Ambulatory referral to Spine Surgery     Other   Encounter for general adult medical examination with abnormal findings - Primary    Physical exam as documented. Counseling done  re healthy lifestyle involving commitment to 150 minutes exercise per week, heart healthy diet, and attaining healthy weight.The importance of adequate sleep also discussed. Changes in health habits are decided on by the patient with goals and time frames  set for achieving them. Immunization and cancer screening needs are specifically addressed at this visit.      Statin intolerance    Had elevated transaminase according to chart review      Other Visit Diagnoses     Need for varicella vaccine       Relevant Orders   Zoster Recombinant (Shingrix ) (Completed)   H/O abdominal hysterectomy           No orders of the defined types were placed in this encounter.   Follow-up: Return in about 6 months (around 07/19/2022) for DM and HTN.    Lindell Spar, MD

## 2022-01-17 ENCOUNTER — Telehealth: Payer: Self-pay

## 2022-01-17 LAB — CBC WITH DIFFERENTIAL/PLATELET
Basophils Absolute: 0 10*3/uL (ref 0.0–0.2)
Basos: 1 %
EOS (ABSOLUTE): 0.1 10*3/uL (ref 0.0–0.4)
Eos: 1 %
Hematocrit: 41.7 % (ref 34.0–46.6)
Hemoglobin: 14 g/dL (ref 11.1–15.9)
Immature Grans (Abs): 0 10*3/uL (ref 0.0–0.1)
Immature Granulocytes: 0 %
Lymphocytes Absolute: 1.6 10*3/uL (ref 0.7–3.1)
Lymphs: 29 %
MCH: 28.9 pg (ref 26.6–33.0)
MCHC: 33.6 g/dL (ref 31.5–35.7)
MCV: 86 fL (ref 79–97)
Monocytes Absolute: 0.4 10*3/uL (ref 0.1–0.9)
Monocytes: 7 %
Neutrophils Absolute: 3.4 10*3/uL (ref 1.4–7.0)
Neutrophils: 62 %
Platelets: 260 10*3/uL (ref 150–450)
RBC: 4.85 x10E6/uL (ref 3.77–5.28)
RDW: 13.1 % (ref 11.7–15.4)
WBC: 5.4 10*3/uL (ref 3.4–10.8)

## 2022-01-17 LAB — LIPID PANEL
Chol/HDL Ratio: 2.9 ratio (ref 0.0–4.4)
Cholesterol, Total: 160 mg/dL (ref 100–199)
HDL: 55 mg/dL (ref >39)
LDL Chol Calc (NIH): 91 mg/dL (ref 0–99)
Triglycerides: 71 mg/dL (ref 0–149)
VLDL Cholesterol Cal: 14 mg/dL (ref 5–40)

## 2022-01-17 LAB — CMP14+EGFR
ALT: 40 IU/L — ABNORMAL HIGH (ref 0–32)
AST: 31 IU/L (ref 0–40)
Albumin/Globulin Ratio: 1.8 (ref 1.2–2.2)
Albumin: 4.6 g/dL (ref 3.8–4.9)
Alkaline Phosphatase: 74 IU/L (ref 44–121)
BUN/Creatinine Ratio: 17 (ref 12–28)
BUN: 13 mg/dL (ref 8–27)
Bilirubin Total: 0.4 mg/dL (ref 0.0–1.2)
CO2: 20 mmol/L (ref 20–29)
Calcium: 9.8 mg/dL (ref 8.7–10.3)
Chloride: 100 mmol/L (ref 96–106)
Creatinine, Ser: 0.76 mg/dL (ref 0.57–1.00)
Globulin, Total: 2.5 g/dL (ref 1.5–4.5)
Glucose: 198 mg/dL — ABNORMAL HIGH (ref 70–99)
Potassium: 4.1 mmol/L (ref 3.5–5.2)
Sodium: 140 mmol/L (ref 134–144)
Total Protein: 7.1 g/dL (ref 6.0–8.5)
eGFR: 90 mL/min/{1.73_m2} (ref >59)

## 2022-01-17 LAB — HEPATITIS C ANTIBODY: Hep C Virus Ab: NONREACTIVE

## 2022-01-17 LAB — TSH+FREE T4
Free T4: 1.35 ng/dL (ref 0.82–1.77)
TSH: 1.05 u[IU]/mL (ref 0.450–4.500)

## 2022-01-17 LAB — VITAMIN D 25 HYDROXY (VIT D DEFICIENCY, FRACTURES): Vit D, 25-Hydroxy: 36 ng/mL (ref 30.0–100.0)

## 2022-01-17 LAB — HIV ANTIBODY (ROUTINE TESTING W REFLEX): HIV Screen 4th Generation wRfx: NONREACTIVE

## 2022-01-17 NOTE — Telephone Encounter (Signed)
Patient returning lab result call

## 2022-01-17 NOTE — Telephone Encounter (Signed)
NA NVM to leave lab results  Mychart message sent with results

## 2022-01-18 ENCOUNTER — Other Ambulatory Visit: Payer: Self-pay | Admitting: Allergy & Immunology

## 2022-01-18 ENCOUNTER — Other Ambulatory Visit: Payer: Self-pay | Admitting: Nurse Practitioner

## 2022-01-19 DIAGNOSIS — Z789 Other specified health status: Secondary | ICD-10-CM | POA: Insufficient documentation

## 2022-01-19 NOTE — Assessment & Plan Note (Signed)

## 2022-01-19 NOTE — Assessment & Plan Note (Signed)
Lab Results  Component Value Date   HGBA1C 8.0 11/14/2021   Follows up with Endocrinology On Lantus 70 units and NovoLog ISS Continue metformin and glipizide Tried to give her statin in the past, had transaminitis -will try again to add statin later Takes Lyrica for neuropathy/low back pain

## 2022-01-19 NOTE — Assessment & Plan Note (Signed)
BP Readings from Last 1 Encounters:  01/16/22 138/88   Usually well-controlled with Lisinopril 20 mg QD now Counseled for compliance with the medications Advised DASH diet and moderate exercise/walking, at least 150 mins/week

## 2022-01-19 NOTE — Assessment & Plan Note (Signed)
Last MRI of lumbar spine in 2004 Currently on Lyrica and as needed Norco, followed by pain clinic -[facility] neurology Acute flareup of low back pain Simple back exercises Spine surgery referral provided

## 2022-01-19 NOTE — Assessment & Plan Note (Signed)
Had elevated transaminase according to chart review

## 2022-01-19 NOTE — Assessment & Plan Note (Signed)
Lab Results  Component Value Date   TSH 1.050 01/16/2022   On levothyroxine 75 mcg QD, followed by Endocrinology 

## 2022-01-22 ENCOUNTER — Telehealth: Payer: Self-pay | Admitting: Internal Medicine

## 2022-01-22 NOTE — Telephone Encounter (Signed)
Patient aware of lab results.

## 2022-01-22 NOTE — Telephone Encounter (Signed)
Pt returning call for labs 

## 2022-01-30 DIAGNOSIS — M79673 Pain in unspecified foot: Secondary | ICD-10-CM | POA: Diagnosis not present

## 2022-01-30 DIAGNOSIS — Z79891 Long term (current) use of opiate analgesic: Secondary | ICD-10-CM | POA: Diagnosis not present

## 2022-01-30 DIAGNOSIS — G8929 Other chronic pain: Secondary | ICD-10-CM | POA: Diagnosis not present

## 2022-01-30 DIAGNOSIS — M542 Cervicalgia: Secondary | ICD-10-CM | POA: Diagnosis not present

## 2022-01-30 DIAGNOSIS — M545 Low back pain, unspecified: Secondary | ICD-10-CM | POA: Diagnosis not present

## 2022-01-31 DIAGNOSIS — H903 Sensorineural hearing loss, bilateral: Secondary | ICD-10-CM | POA: Diagnosis not present

## 2022-02-01 DIAGNOSIS — Z6837 Body mass index (BMI) 37.0-37.9, adult: Secondary | ICD-10-CM | POA: Diagnosis not present

## 2022-02-01 DIAGNOSIS — M5416 Radiculopathy, lumbar region: Secondary | ICD-10-CM | POA: Diagnosis not present

## 2022-02-09 ENCOUNTER — Other Ambulatory Visit: Payer: Self-pay | Admitting: Internal Medicine

## 2022-02-13 DIAGNOSIS — M5416 Radiculopathy, lumbar region: Secondary | ICD-10-CM | POA: Diagnosis not present

## 2022-02-15 DIAGNOSIS — Z6837 Body mass index (BMI) 37.0-37.9, adult: Secondary | ICD-10-CM | POA: Diagnosis not present

## 2022-02-15 DIAGNOSIS — M5416 Radiculopathy, lumbar region: Secondary | ICD-10-CM | POA: Diagnosis not present

## 2022-02-19 ENCOUNTER — Other Ambulatory Visit: Payer: Self-pay | Admitting: Internal Medicine

## 2022-02-19 DIAGNOSIS — I10 Essential (primary) hypertension: Secondary | ICD-10-CM

## 2022-02-20 ENCOUNTER — Encounter: Payer: Self-pay | Admitting: Nurse Practitioner

## 2022-02-20 ENCOUNTER — Ambulatory Visit: Payer: Medicaid Other | Admitting: Nurse Practitioner

## 2022-02-20 VITALS — BP 128/84 | HR 69 | Ht 65.0 in | Wt 225.0 lb

## 2022-02-20 DIAGNOSIS — Z794 Long term (current) use of insulin: Secondary | ICD-10-CM

## 2022-02-20 DIAGNOSIS — E559 Vitamin D deficiency, unspecified: Secondary | ICD-10-CM | POA: Diagnosis not present

## 2022-02-20 DIAGNOSIS — I10 Essential (primary) hypertension: Secondary | ICD-10-CM | POA: Diagnosis not present

## 2022-02-20 DIAGNOSIS — E039 Hypothyroidism, unspecified: Secondary | ICD-10-CM | POA: Diagnosis not present

## 2022-02-20 DIAGNOSIS — E782 Mixed hyperlipidemia: Secondary | ICD-10-CM

## 2022-02-20 DIAGNOSIS — E1165 Type 2 diabetes mellitus with hyperglycemia: Secondary | ICD-10-CM | POA: Diagnosis not present

## 2022-02-20 LAB — POCT GLYCOSYLATED HEMOGLOBIN (HGB A1C): Hemoglobin A1C: 8.2 % — AB (ref 4.0–5.6)

## 2022-02-20 MED ORDER — NOVOLOG FLEXPEN 100 UNIT/ML ~~LOC~~ SOPN: 20.0000 [IU] | PEN_INJECTOR | Freq: Three times a day (TID) | SUBCUTANEOUS | 3 refills | Status: DC

## 2022-02-20 MED ORDER — LANTUS SOLOSTAR 100 UNIT/ML ~~LOC~~ SOPN: 60.0000 [IU] | PEN_INJECTOR | Freq: Every day | SUBCUTANEOUS | 3 refills | Status: DC

## 2022-02-20 NOTE — Progress Notes (Signed)
02/20/2022   Endocrinology follow-up note   Subjective:    Patient ID: Sandy Valencia, female    DOB: 08/02/1960, PCP Lindell Spar, MD   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   Anaphylactic shock, unspecified, subsequent encounter    Anemia, iron deficiency 02/14/2015   Angio-edema    Arthritis    Asthma    Asymptomatic varicose veins of right lower extremity    Chronic obstructive pulmonary disease with (acute) exacerbation (HCC)    Chronic rhinitis 05/21/2018   COVID-19    Diabetes mellitus    Essential (primary) hypertension    Family history of colon cancer 09/03/2012   Fatty liver    Gastro-esophageal reflux disease without esophagitis    GERD (gastroesophageal reflux disease)    History of colonic polyps 08/26/2017   Hypercholesteremia    Hypertension    Hypothyroidism    Low back pain    Mass of finger, right s/p surgical excision (mucoid cyst) 04/17/18 04/22/2018   Mucous cyst of digit of right hand    Obesity, unspecified    Other adverse food reactions, not elsewhere classified, initial encounter    Personal history of noncompliance with medical treatment, presenting hazards to health 05/18/2015   Rash and other nonspecific skin eruption    Sleep apnea    pt siad, "i had small amount but could not tolerate CPAP and they said it was ok since it was mild.   Sleep apnea, unspecified    Unspecified asthma, uncomplicated    Zoster without complications    Past Surgical History:  Procedure Laterality Date   ABDOMINAL HYSTERECTOMY     CHOLECYSTECTOMY     COLONOSCOPY N/A 09/22/2012   RMR: colonic polyps -removed as described above. tubular adenoma, next TCS 09/2017   COLONOSCOPY WITH PROPOFOL N/A 09/23/2017   Procedure: COLONOSCOPY WITH PROPOFOL;  Surgeon: Daneil Dolin, MD;  [facility]: [facility];  Service: Endoscopy;  Laterality: N/A;  7:30am   CYST EXCISION Left  07/19/2016   Procedure: CYST REMOVAL LEFT RING FINGER;  Surgeon: Carole Civil, MD;  Location: [facility];  Service: Orthopedics;  Laterality: Left;   CYST REMOVAL LEG Right    foot   ESOPHAGOGASTRODUODENOSCOPY N/A 10/21/2014   RMR: Small hiatal hernia; otherwise normal EGD status post passage of a Maloney dilator.    EXCISION MASS UPPER EXTREMETIES Right 04/17/2018   Procedure: EXCISION MASS UPPER EXTREMETIES right ring finger;  Surgeon: Carole Civil, MD;  Location: [facility];  Service: Orthopedics;  Laterality: Right;   MALONEY DILATION N/A 10/21/2014   Procedure: [name] DILATION;  Surgeon: Daneil Dolin, MD;  Location: [facility];  Service: Endoscopy;  Laterality: N/A;   MIDDLE EAR SURGERY Right    patched hole in ear drum   POLYPECTOMY  09/23/2017   Procedure: POLYPECTOMY;  Surgeon: Daneil Dolin, MD;  [facility]: [facility];  Service: Endoscopy;;  polyp hepatic flexure polyp cs, splenic flexure polyp cs, rectal polyp cs   Social History   Socioeconomic History   Marital status: Divorced    Spouse name: Not on file   Number of children: 2   Years of education: Not on file   Highest education level: Not on file  Occupational History   Not on file  Tobacco Use   Smoking status: Never   Smokeless tobacco: Never  Vaping Use   Vaping Use:  Never used  Substance and Sexual Activity   Alcohol use: No    Alcohol/week: 0.0 standard drinks of alcohol   Drug use: No   Sexual activity: Yes    Birth control/protection: Surgical  Other Topics Concern   Not on file  Social History Narrative   Lives with grandson (since born)      Enjoys: vacation, travel, concerts      Diet: eats all food groups    Caffeine: diet Pepsi daily    Water:  8 cups daily       Wears seat belt    Does use phone   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    Social Determinants of Health   Financial Resource Strain: Low Risk  (12/02/2019)   Overall Financial Resource Strain (CARDIA)     Difficulty of Paying Living Expenses: Not hard at all  Food Insecurity: No Food Insecurity (12/02/2019)   Hunger Vital Sign    Worried About Running Out of Food in the Last Year: Never true    [place] in the Last Year: Never true  Transportation Needs: No Transportation Needs (12/02/2019)   PRAPARE - Hydrologist (Medical): No    Lack of Transportation (Non-Medical): No  Physical Activity: Insufficiently Active (12/02/2019)   Exercise Vital Sign    Days of Exercise per Week: 2 days    Minutes of Exercise per Session: 20 min  Stress: Not on file  Social Connections: Moderately Isolated (12/02/2019)   Social Connection and Isolation Panel [NHANES]    Frequency of Communication with Friends and Family: More than three times a week    Frequency of Social Gatherings with Friends and Family: More than three times a week    Attends Religious Services: More than 4 times per year    Active Member of [employer] or Organizations: No    Attends Archivist Meetings: Never    Marital Status: Divorced   Outpatient Encounter Medications as of 02/20/2022  Medication Sig   Accu-Chek FastClix Lancets MISC USE TO TEST 4 TIMES DAILY.   ACCU-CHEK GUIDE test strip USE TO TEST 4 TIMES DAILY.   aspirin EC 81 MG tablet Take 81 mg by mouth daily.   cetirizine (ZYRTEC) 10 MG tablet TAKE ONE TABLET (10MG TOTAL) BY MOUTH DAILY   Cholecalciferol (VITAMIN D-3) 1000 UNITS CAPS Take 1,000 Units by mouth daily.    Continuous Blood Gluc Sensor (DEXCOM G6 SENSOR) MISC Change sensor every 10 days as directed   Continuous Blood Gluc Transmit (DEXCOM G6 TRANSMITTER) MISC Change transmitter every 90 days as directed.   diclofenac Sodium (VOLTAREN) 1 % GEL APPLY 2 GRAMS TO AFFECTED AREAS 2 TIMES A DAY AS NEEDED   EPINEPHrine 0.3 mg/0.3 mL IJ SOAJ injection Inject 0.3 mg into the muscle as needed for anaphylaxis.   fluticasone (FLONASE) 50 MCG/ACT nasal spray Place 1 spray into both  nostrils daily.   HYDROcodone-acetaminophen (NORCO) 7.5-325 MG tablet Take by mouth.   ketoconazole (NIZORAL) 2 % cream APPLY TO AFFECTED AREAS TWICE DAILY   levothyroxine (SYNTHROID) 75 MCG tablet TAKE ONE (1) TABLET BY MOUTH EVERY DAY BEFORE BREAKFAST   lisinopril (ZESTRIL) 20 MG tablet TAKE ONE TABLET (20MG TOTAL) BY MOUTH DAILY   LITETOUCH PEN NEEDLES 31G X 8 MM MISC USE AS DIRECTED FOUR TIMES DAILY.   metFORMIN (GLUCOPHAGE-XR) 500 MG 24 hr tablet TAKE ONE TABLET BY MOUTH TWICE A DAY AFTER A MEAL  pantoprazole (PROTONIX) 40 MG tablet TAKE ONE TABLET (40MG TOTAL) BY MOUTH DAILY   pregabalin (LYRICA) 150 MG capsule Take 150 mg by mouth 2 (two) times daily.   PROAIR HFA 108 (90 Base) MCG/ACT inhaler INHALE 4 PUFFS INTO THE LUNGS EVERY 6 HOURS AS NEEDED FOR WHEEZING OR SHORTNESS OF BREATH   Semaglutide, 1 MG/DOSE, 4 MG/3ML SOPN Inject 1 mg as directed once a week.   triamcinolone cream (KENALOG) 0.1 % Apply 1 application topically 2 (two) times daily.   vitamin B-12 (CYANOCOBALAMIN) 500 MCG tablet Take 500 mcg by mouth daily.   [DISCONTINUED] LANTUS SOLOSTAR 100 UNIT/ML Solostar Pen INJECT 80 UNITS SUBCUTANEOUSLY INTO THE SKIN DAILY. (Patient taking differently: 70 Units at bedtime.)   [DISCONTINUED] NOVOLOG FLEXPEN 100 UNIT/ML FlexPen INJECT 25 UNITS TO 31 UNITS INTO THE SKIN THREE TIMES DAILY WITH MEALS.   insulin aspart (NOVOLOG FLEXPEN) 100 UNIT/ML FlexPen Inject 20-26 Units into the skin 3 (three) times daily with meals.   LANTUS SOLOSTAR 100 UNIT/ML Solostar Pen Inject 60 Units into the skin at bedtime.   [DISCONTINUED] ketoconazole (NIZORAL) 2 % cream APPLY TO AFFECTED AREAS TWICE DAILY   [DISCONTINUED] lisinopril (ZESTRIL) 20 MG tablet TAKE ONE TABLET (20MG TOTAL) BY MOUTH DAILY   No facility-administered encounter medications on file as of 02/20/2022.   ALLERGIES: Allergies  Allergen Reactions   Kiwi Extract Anaphylaxis, Swelling and Palpitations   Other Anaphylaxis    Walnuts  (avoids all tree nuts)   VACCINATION STATUS: Immunization History  Administered Date(s) Administered   Influenza,inj,Quad PF,6+ Mos 04/15/2017, 03/30/2019   Influenza-Unspecified 04/17/2011, 03/24/2012, 05/12/2013, 02/23/2015, 04/04/2017, 04/08/2018   Moderna Sars-Covid-2 Vaccination 10/22/2019, 11/19/2019   Tdap 09/18/2021   Zoster Recombinat (Shingrix) 09/14/2021, 01/16/2022    Diabetes She presents for her follow-up diabetic visit. She has type 2 diabetes mellitus. Onset time: She was diagnosed at approximate age of 53 years. Her disease course has been improving. Hypoglycemia symptoms include hunger and tremors. Pertinent negatives for hypoglycemia include no confusion, nervousness/anxiousness, pallor or seizures. Associated symptoms include weight loss. Pertinent negatives for diabetes include no fatigue, no polydipsia, no polyphagia and no polyuria. There are no hypoglycemic complications. Symptoms are stable. There are no diabetic complications. Risk factors for coronary artery disease include dyslipidemia, diabetes mellitus, hypertension, sedentary lifestyle, obesity, post-menopausal and stress. Current diabetic treatment includes intensive insulin program and oral agent (monotherapy) (and Ozempic). She is compliant with treatment most of the time. Her weight is decreasing steadily. She is following a generally healthy diet. When asked about meal planning, she reported none. She has had a previous visit with a dietitian. She never participates in exercise. Her home blood glucose trend is decreasing steadily. Her overall blood glucose range is 140-180 mg/dl. (She presents today with her CGM and meter showing improved glycemic profile overall.  Her POCT A1c today is 8.2%, increasing slightly from last visit of 8%.  She did have steroid injection in her back between visits and is soon to have another.  Analysis of her CGM shows TIR 51%, TAR 49%, TBR 0%.  She notes she does have some mid day drops  in glucose where she overcompensates and has high spike right after.) An ACE inhibitor/angiotensin II receptor blocker is being taken. She sees a podiatrist.Eye exam is current.  Hyperlipidemia This is a chronic problem. The current episode started more than 1 year ago. The problem is controlled. Recent lipid tests were reviewed and are normal. Exacerbating diseases include diabetes, hypothyroidism and obesity. There are no  known factors aggravating her hyperlipidemia. Pertinent negatives include no myalgias. Current antihyperlipidemic treatment includes statins. The current treatment provides significant improvement of lipids. Compliance problems include medication cost, adherence to diet and adherence to exercise.  Risk factors for coronary artery disease include diabetes mellitus, dyslipidemia, hypertension, obesity, a sedentary lifestyle and post-menopausal.  Hypertension This is a chronic problem. The current episode started more than 1 year ago. The problem has been gradually improving since onset. The problem is controlled. Pertinent negatives include no palpitations. Agents associated with hypertension include thyroid hormones. Risk factors for coronary artery disease include diabetes mellitus, dyslipidemia, obesity, post-menopausal state, sedentary lifestyle and stress. Past treatments include ACE inhibitors. The current treatment provides mild improvement. Compliance problems include diet and exercise.  Identifiable causes of hypertension include sleep apnea and a thyroid problem.  Thyroid Problem Presents for follow-up visit. Symptoms include tremors and weight loss. Patient reports no anxiety, cold intolerance, constipation, depressed mood, diarrhea, dry skin, fatigue, hair loss, heat intolerance, palpitations or weight gain. The symptoms have been stable. Her past medical history is significant for diabetes and hyperlipidemia.     Review of systems  Constitutional: + decreasing body weight,   current Body mass index is 37.44 kg/m. , no fatigue, no subjective hyperthermia, no subjective hypothermia Eyes: no blurry vision, no xerophthalmia ENT: no sore throat, no nodules palpated in throat, no dysphagia/odynophagia, no hoarseness Cardiovascular: no chest pain, no shortness of breath, no palpitations, no leg swelling Respiratory: no cough, no shortness of breath Gastrointestinal: no nausea/vomiting/diarrhea Musculoskeletal: no muscle/joint aches Skin: no rashes, no hyperemia Neurological: no tremors, no numbness, no tingling, no dizziness Psychiatric: no depression, no anxiety   Objective:    BP 128/84 (BP Location: Left Arm, Patient Position: Sitting, Cuff Size: Large)   Pulse 69   Ht '5\' 5"'  (1.651 m)   Wt 225 lb (102.1 kg)   BMI 37.44 kg/m   Wt Readings from Last 3 Encounters:  02/20/22 225 lb (102.1 kg)  01/16/22 224 lb 12.8 oz (102 kg)  11/14/21 231 lb (104.8 kg)    BP Readings from Last 3 Encounters:  02/20/22 128/84  01/16/22 138/88  11/14/21 140/84    Physical Exam- Limited  Constitutional:  Body mass index is 37.44 kg/m. , not in acute distress, normal state of mind Eyes:  EOMI, no exophthalmos Neck: Supple Cardiovascular: RRR, no murmurs, rubs, or gallops, no edema Respiratory: Adequate breathing efforts, no crackles, rales, rhonchi, or wheezing Musculoskeletal: no gross deformities, strength intact in all four extremities, no gross restriction of joint movements Skin:  no rashes, no hyperemia Neurological: no tremor with outstretched hands    Lipid Panel     Component Value Date/Time   CHOL 160 01/16/2022 0931   TRIG 71 01/16/2022 0931   HDL 55 01/16/2022 0931   CHOLHDL 2.9 01/16/2022 0931   CHOLHDL 2.4 12/25/2019 0709   VLDL 10 03/07/2016 0705   LDLCALC 91 01/16/2022 0931   LDLCALC 65 12/25/2019 0709    Recent Results (from the past 2160 hour(s))  HM DIABETES EYE EXAM     Status: None   Collection Time: 01/02/22 12:00 AM  Result Value  Ref Range   HM Diabetic Eye Exam No Retinopathy No Retinopathy  VITAMIN D 25 Hydroxy (Vit-D Deficiency, Fractures)     Status: None   Collection Time: 01/16/22  9:31 AM  Result Value Ref Range   Vit D, 25-Hydroxy 36.0 30.0 - 100.0 ng/mL    Comment: Vitamin D deficiency has been defined  by the [place] practice guideline as a level of serum 25-OH vitamin D less than 20 ng/mL (1,2). The Endocrine Society went on to further define vitamin D insufficiency as a level between 21 and 29 ng/mL (2). 1. IOM (Institute of Medicine). 2010. Dietary reference    intakes for calcium and D. [place]: The    [employer]. 2. Holick MF, Binkley [place], Bischoff-Ferrari HA, et al.    Evaluation, treatment, and prevention of vitamin D    deficiency: an Endocrine Society clinical practice    guideline. JCEM. 2011 Jul; 96(7):1911-30.   Lipid panel     Status: None   Collection Time: 01/16/22  9:31 AM  Result Value Ref Range   Cholesterol, Total 160 100 - 199 mg/dL   Triglycerides 71 0 - 149 mg/dL   HDL 55 >39 mg/dL   VLDL Cholesterol Cal 14 5 - 40 mg/dL   LDL Chol Calc (NIH) 91 0 - 99 mg/dL   Chol/HDL Ratio 2.9 0.0 - 4.4 ratio    Comment:                                   T. Chol/HDL Ratio                                             Men  Women                               1/2 Avg.Risk  3.4    3.3                                   Avg.Risk  5.0    4.4                                2X Avg.Risk  9.6    7.1                                3X Avg.Risk 23.4   11.0   CMP14+EGFR     Status: Abnormal   Collection Time: 01/16/22  9:31 AM  Result Value Ref Range   Glucose 198 (H) 70 - 99 mg/dL   BUN 13 8 - 27 mg/dL   Creatinine, Ser 0.76 0.57 - 1.00 mg/dL   eGFR 90 >59 mL/min/1.73   BUN/Creatinine Ratio 17 12 - 28   Sodium 140 134 - 144 mmol/L   Potassium 4.1 3.5 - 5.2 mmol/L   Chloride 100 96 - 106 mmol/L   CO2 20 20 - 29 mmol/L   Calcium 9.8 8.7 -  10.3 mg/dL   Total Protein 7.1 6.0 - 8.5 g/dL   Albumin 4.6 3.8 - 4.9 g/dL   Globulin, Total 2.5 1.5 - 4.5 g/dL   Albumin/Globulin Ratio 1.8 1.2 - 2.2   Bilirubin Total 0.4 0.0 - 1.2 mg/dL   Alkaline Phosphatase 74 44 - 121 IU/L   AST 31 0 - 40 IU/L   ALT 40 (H) 0 - 32 IU/L  CBC with Differential/Platelet  Status: None   Collection Time: 01/16/22  9:31 AM  Result Value Ref Range   WBC 5.4 3.4 - 10.8 x10E3/uL   RBC 4.85 3.77 - 5.28 x10E6/uL   Hemoglobin 14.0 11.1 - 15.9 g/dL   Hematocrit 41.7 34.0 - 46.6 %   MCV 86 79 - 97 fL   MCH 28.9 26.6 - 33.0 pg   MCHC 33.6 31.5 - 35.7 g/dL   RDW 13.1 11.7 - 15.4 %   Platelets 260 150 - 450 x10E3/uL   Neutrophils 62 Not Estab. %   Lymphs 29 Not Estab. %   Monocytes 7 Not Estab. %   Eos 1 Not Estab. %   Basos 1 Not Estab. %   Neutrophils Absolute 3.4 1.4 - 7.0 x10E3/uL   Lymphocytes Absolute 1.6 0.7 - 3.1 x10E3/uL   Monocytes Absolute 0.4 0.1 - 0.9 x10E3/uL   EOS (ABSOLUTE) 0.1 0.0 - 0.4 x10E3/uL   Basophils Absolute 0.0 0.0 - 0.2 x10E3/uL   Immature Granulocytes 0 Not Estab. %   Immature Grans (Abs) 0.0 0.0 - 0.1 x10E3/uL  Hepatitis C Antibody     Status: None   Collection Time: 01/16/22  9:31 AM  Result Value Ref Range   Hep C Virus Ab Non Reactive Non Reactive    Comment: HCV antibody alone does not differentiate between previously resolved infection and active infection. Equivocal and Reactive HCV antibody results should be followed up with an HCV RNA test to support the diagnosis of active HCV infection.   HIV antibody (with reflex)     Status: None   Collection Time: 01/16/22  9:31 AM  Result Value Ref Range   HIV Screen 4th Generation wRfx Non Reactive Non Reactive    Comment: HIV Negative HIV-1/HIV-2 antibodies and HIV-1 p24 antigen were NOT detected. There is no laboratory evidence of HIV infection.   TSH + free T4     Status: None   Collection Time: 01/16/22  9:31 AM  Result Value Ref Range   TSH 1.050 0.450 -  4.500 uIU/mL   Free T4 1.35 0.82 - 1.77 ng/dL  HgB A1c     Status: Abnormal   Collection Time: 02/20/22  8:42 AM  Result Value Ref Range   Hemoglobin A1C 8.2 (A) 4.0 - 5.6 %   HbA1c POC (<> result, manual entry)     HbA1c, POC (prediabetic range)     HbA1c, POC (controlled diabetic range)         Assessment & Plan:   1) Uncontrolled type 2 diabetes mellitus with complication, with long-term current use of insulin ([place])  She presents today with her CGM and meter showing improved glycemic profile overall.  Her POCT A1c today is 8.2%, increasing slightly from last visit of 8%.  She did have steroid injection in her back between visits and is soon to have another.  Analysis of her CGM shows TIR 51%, TAR 49%, TBR 0%.  She notes she does have some mid day drops in glucose where she overcompensates and has high spike right after.  -Her diabetes is complicated by history of noncompliance (she recently showed better engagement) and patient remains at extremely high risk for more acute and chronic complications of diabetes which include CAD, CVA, CKD, retinopathy, and neuropathy. These are all discussed in detail with the patient.  - Nutritional counseling repeated at each appointment due to patients tendency to fall back in to old habits.  - The patient admits there is a room for improvement in  their diet and drink choices. -  Suggestion is made for the patient to avoid simple carbohydrates from their diet including Cakes, Sweet Desserts / Pastries, Ice Cream, Soda (diet and regular), Sweet Tea, Candies, Chips, Cookies, Sweet Pastries, Store Bought Juices, Alcohol in Excess of 1-2 drinks a day, Artificial Sweeteners, Coffee Creamer, and "Sugar-free" Products. This will help patient to have stable blood glucose profile and potentially avoid unintended weight gain.   - I encouraged the patient to switch to unprocessed or minimally processed complex starch and increased protein intake (animal or plant  source), fruits, and vegetables.   - Patient is advised to stick to a routine mealtimes to eat 3 meals a day and avoid unnecessary snacks (to snack only to correct hypoglycemia).  - I have approached patient with the following individualized plan to manage diabetes and patient agrees.  -She will continue to require intensive treatment with  basal/bolus insulin in order for her to achieve and maintain control of diabetes to target.  -She is advised to continue her Lantus 60 units SQ nightly and lower her Novolog to 20-26 units TID with meals if glucose is above 90 and she is eating (Specific instructions on how to titrate insulin dosage based on glucose readings given to patient in writing). She can also continue her Metformin 500 mg ER twice daily with meals and Ozempic 1 mg SQ weekly.   -She is encouraged to continue monitoring blood glucose 4 times daily-using her CGM, before meals and before bed, and to call the clinic if she has readings less than 70 or greater than 300 for 3 tests in a row.  She has benefited from CGM device and is advised to continue.  2) Lipids/HPL:  Her most recent lipid panel from 01/16/22 shows controlled LDL of 91.  She was taken off her Statin due to elevated liver enzymes- which are now improving steadily.  Side effects and precautions discussed with her.    3) Hypertension:  Her blood pressure is controlled to target.  She is advised to continue Lisinopril 20 mg po daily (managed by her PCP).  4) Hypothyroidism:  -Her previsit thyroid function tests are consistent with appropriate hormone replacement.  She is advised to continue her current dose of Levothyroxine 75 mcg po daily before breakfast (skipping 2 days out of the week).    - We discussed about the correct intake of her thyroid hormone, on empty stomach at fasting, with water, separated by at least 30 minutes from breakfast and other medications,  and separated by more than 4 hours from calcium, iron,  multivitamins, acid reflux medications (PPIs). -Patient is made aware of the fact that thyroid hormone replacement is needed for life, dose to be adjusted by periodic monitoring of thyroid function tests.    - I advised patient to maintain close follow up with Lindell Spar, MD for primary care needs.      I spent 34 minutes in the care of the patient today including review of labs from [place], Lipids, Thyroid Function, Hematology (current and previous including abstractions from other facilities); face-to-face time discussing  her blood glucose readings/logs, discussing hypoglycemia and hyperglycemia episodes and symptoms, medications doses, her options of short and long term treatment based on the latest standards of care / guidelines;  discussion about incorporating lifestyle medicine;  and documenting the encounter. Risk reduction counseling performed per USPSTF guidelines to reduce obesity and cardiovascular risk factors.     Please refer to Patient Instructions for Blood  Glucose Monitoring and Insulin/Medications Dosing Guide"  in media tab for additional information. Please  also refer to " Patient Self Inventory" in the Media  tab for reviewed elements of pertinent patient history.  Gillian Scarce participated in the discussions, expressed understanding, and voiced agreement with the above plans.  All questions were answered to her satisfaction. she is encouraged to contact clinic should she have any questions or concerns prior to her return visit.    Follow up plan: -Return in about 4 months (around 06/22/2022) for Diabetes F/U with A1c in office, No previsit labs, Bring meter and logs.  Rayetta Pigg, [facility] [address] [place], [place] [address] Phone: [phone] Fax: [phone]   02/20/2022, 8:55 AM

## 2022-02-28 DIAGNOSIS — M5416 Radiculopathy, lumbar region: Secondary | ICD-10-CM | POA: Diagnosis not present

## 2022-03-06 ENCOUNTER — Encounter (HOSPITAL_COMMUNITY)
Admission: RE | Admit: 2022-03-06 | Discharge: 2022-03-06 | Disposition: A | Discharge status: Home / Self Care | Payer: Medicaid Other | Source: Ambulatory Visit | Attending: Ophthalmology | Admitting: Ophthalmology

## 2022-03-06 NOTE — H&P (Signed)
Surgical History & Physical  Patient Name: Sandy Valencia DOB: 12/10/1960  Surgery: Cataract extraction with intraocular lens implant phacoemulsification; Left Eye  Surgeon: Baruch Goldmann MD Surgery Date:  03-09-22 Pre-Op Date:  02-19-22  HPI: A 73 Yr. old female patient is referred by Dr Tora Perches for cataract eval. 1. 1. The patient complains of difficulty when viewing TV, reading closed caption, news scrolls on TV, which began 6 months ago. Both eyes are affected. The episode is gradual. Symptoms occur when the patient is inside. This is negatively affecting the patient's quality of life and the patient is unable to function adequately in life with the current level of vision. HPI was performed by Baruch Goldmann .  Medical History: Cataracts Diabetes High Blood Pressure LDL  Review of Systems Negative Allergic/Immunologic Negative Cardiovascular Negative Constitutional Negative Ear, Nose, Mouth & Throat Negative Endocrine Negative Eyes Negative Gastrointestinal Negative Genitourinary Negative Hemotologic/Lymphatic Negative Integumentary Negative Musculoskeletal Negative Neurological Negative Psychiatry Negative Respiratory  Social   Never smoked   Medication Aspirin, Cetirizine, Diclofenac Sodium, Fluticasone, Hydrocodone-Acetaminophenj, Fluticasone, Ketoconazole Cream, Lantus, Levothyroxine Sodium, Lisinopril, Metformin, NovoLog, Pantoprazole, Pregabalin, Proair respiClick, Semagutide inj, Vitamin B-12, Vitamin D3, Ozempic,   Sx/Procedures Ear sling, Gallbladder Sx, Hysterectomy,   Drug Allergies   NKDA  History & Physical: Heent: Cataract, left eye NECK: supple without bruits LUNGS: lungs clear to auscultation CV: regular rate and rhythm Abdomen: soft and non-tender Impression & Plan: Assessment: 1.  COMBINED FORMS AGE RELATED CATARACT; Both Eyes (H25.813) 2.  Hyperopia ; Both Eyes (H52.03) 3.  DERMATOCHALASIS, no surgery; Right Upper Lid, Left Upper Lid  (H02.831, H02.834) 4.  BLEPHARITIS; Right Upper Lid, Right Lower Lid, Left Upper Lid, Left Lower Lid (H01.001, H01.002,H01.004,H01.005) 5.  Pinguecula; Both Eyes (H11.153) 6.  ASTIGMATISM, REGULAR; Both Eyes (H52.223)  Plan: 1.  Cataract accounts for the patient's decreased vision. This visual impairment is not correctable with a tolerable change in glasses or contact lenses. Cataract surgery with an implantation of a new lens should significantly improve the visual and functional status of the patient. Discussed all risks, benefits, alternatives, and potential complications. Discussed the procedures and recovery. Patient desires to have surgery. A-scan ordered and performed today for intra-ocular lens calculations. The surgery will be performed in order to improve vision for driving, reading, and for eye examinations. Recommend phacoemulsification with intra-ocular lens. Recommend Dextenza for post-operative pain and inflammation. Left Eye non-dominant - first.. Dilates poorly - shugarcaine by protocol. Malyugin Ring. Omidira. Toric Lens.  2.   3.  Asymptomatic, recommend observation for now. Findings, prognosis and treatment options reviewed.  4.  Recommend regular lid cleaning.  5.  Observe; Artificial tears as needed for irritation.  6.  Recommend toric IOL OU.

## 2022-03-08 ENCOUNTER — Encounter: Payer: Self-pay | Admitting: Internal Medicine

## 2022-03-08 ENCOUNTER — Ambulatory Visit: Payer: Medicaid Other | Admitting: Internal Medicine

## 2022-03-08 VITALS — BP 122/78 | HR 90 | Resp 18 | Ht 60.0 in | Wt 219.0 lb

## 2022-03-08 DIAGNOSIS — J3089 Other allergic rhinitis: Secondary | ICD-10-CM | POA: Diagnosis not present

## 2022-03-08 DIAGNOSIS — J0100 Acute maxillary sinusitis, unspecified: Secondary | ICD-10-CM | POA: Diagnosis not present

## 2022-03-08 DIAGNOSIS — M5416 Radiculopathy, lumbar region: Secondary | ICD-10-CM | POA: Insufficient documentation

## 2022-03-08 LAB — POCT INFLUENZA A/B
Influenza A, POC: NEGATIVE
Influenza B, POC: NEGATIVE

## 2022-03-08 MED ORDER — AZITHROMYCIN 250 MG PO TABS: ORAL_TABLET | ORAL | 0 refills | Status: AC

## 2022-03-08 NOTE — Assessment & Plan Note (Signed)
Takes Zyrtec 10 mg QD Added Norel for symptomatic relief Uses Flonase

## 2022-03-08 NOTE — Progress Notes (Signed)
Acute Office Visit  Subjective:    Patient ID: Sandy Valencia, female    DOB: November 18, 1960, 61 y.o.   MRN: [id number]  Chief Complaint  Patient presents with   Sinus Problem    Pt has pressure in right ear suffy nose no fever no cough no congestion started 03/05/22    HPI Patient is in today for c/o nasal congestion, sinus pressure and right ear fullness for the last 3 days.  She has history of allergic rhinitis, for which she uses Flonase and takes Zyrtec.  She denies any fever, chills, dyspnea or wheezing currently.  Denies any ear pain or discharge currently.  She has tried doing Valsalva maneuver to improve her sinus symptoms, but has been having ear discomfort with it.  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   Anaphylactic shock, unspecified, subsequent encounter    Anemia, iron deficiency 02/14/2015   Angio-edema    Arthritis    Asthma    Asymptomatic varicose veins of right lower extremity    Chronic obstructive pulmonary disease with (acute) exacerbation (HCC)    Chronic rhinitis 05/21/2018   COVID-19    Diabetes mellitus    Essential (primary) hypertension    Family history of colon cancer 09/03/2012   Fatty liver    Gastro-esophageal reflux disease without esophagitis    GERD (gastroesophageal reflux disease)    History of colonic polyps 08/26/2017   Hypercholesteremia    Hypertension    Hypothyroidism    Low back pain    Mass of finger, right s/p surgical excision (mucoid cyst) 04/17/18 04/22/2018   Mucous cyst of digit of right hand    Obesity, unspecified    Other adverse food reactions, not elsewhere classified, initial encounter    Personal history of noncompliance with medical treatment, presenting hazards to health 05/18/2015   Rash and other nonspecific skin eruption    Sleep apnea    pt siad, "i had small amount but could not tolerate CPAP and they said it was ok  since it was mild.   Sleep apnea, unspecified    Unspecified asthma, uncomplicated    Zoster without complications     Past Surgical History:  Procedure Laterality Date   ABDOMINAL HYSTERECTOMY     CHOLECYSTECTOMY     COLONOSCOPY N/A 09/22/2012   RMR: colonic polyps -removed as described above. tubular adenoma, next TCS 09/2017   COLONOSCOPY WITH PROPOFOL N/A 09/23/2017   Procedure: COLONOSCOPY WITH PROPOFOL;  Surgeon: Daneil Dolin, MD;  Location: [facility];  Service: Endoscopy;  Laterality: N/A;  7:30am   CYST EXCISION Left 07/19/2016   Procedure: CYST REMOVAL LEFT RING FINGER;  Surgeon: Carole Civil, MD;  Location: [facility];  Service: Orthopedics;  Laterality: Left;   CYST REMOVAL LEG Right    foot   ESOPHAGOGASTRODUODENOSCOPY N/A 10/21/2014   RMR: Small hiatal hernia; otherwise normal EGD status post passage of a Maloney dilator.    EXCISION MASS UPPER EXTREMETIES Right 04/17/2018   Procedure: EXCISION MASS UPPER EXTREMETIES right ring finger;  Surgeon: Carole Civil, MD;  Location: [facility];  Service: Orthopedics;  Laterality: Right;   MALONEY DILATION N/A 10/21/2014   Procedure: [name] DILATION;  Surgeon: Daneil Dolin, MD;  Location: [facility];  Service: Endoscopy;  Laterality: N/A;   MIDDLE EAR SURGERY Right    patched hole in ear drum   POLYPECTOMY  09/23/2017   Procedure: POLYPECTOMY;  Surgeon: Daneil Dolin, MD;  Location: [facility];  Service: Endoscopy;;  polyp hepatic flexure polyp cs, splenic flexure polyp cs, rectal polyp cs    Family History  Problem Relation Age of Onset   Colon cancer Mother        diagnosed age 25, underwent surgical resection, metastatic disease several years later   Diabetes Mother    Heart attack Father    Diabetes Sister    Hypothyroidism Brother    Diabetes Sister    Diabetes Sister    Diabetes Sister    Hypothyroidism Sister    Allergic rhinitis Neg Hx    Angioedema Neg Hx    Asthma Neg Hx    Atopy Neg Hx     Eczema Neg Hx    Immunodeficiency Neg Hx    Urticaria Neg Hx     Social History   Socioeconomic History   Marital status: Divorced    Spouse name: Not on file   Number of children: 2   Years of education: Not on file   Highest education level: Not on file  Occupational History   Not on file  Tobacco Use   Smoking status: Never   Smokeless tobacco: Never  Vaping Use   Vaping Use: Never used  Substance and Sexual Activity   Alcohol use: No    Alcohol/week: 0.0 standard drinks of alcohol   Drug use: No   Sexual activity: Yes    Birth control/protection: Surgical  Other Topics Concern   Not on file  Social History Narrative   Lives with grandson (since born)      Enjoys: vacation, travel, concerts      Diet: eats all food groups    Caffeine: diet Pepsi daily    Water:  8 cups daily       Wears seat belt    Does use phone   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    Social Determinants of Health   Financial Resource Strain: Low Risk  (12/02/2019)   Overall Financial Resource Strain (CARDIA)    Difficulty of Paying Living Expenses: Not hard at all  Food Insecurity: No Food Insecurity (12/02/2019)   Hunger Vital Sign    Worried About Running Out of Food in the Last Year: Never true    Ran Out of Food in the Last Year: Never true  Transportation Needs: No Transportation Needs (12/02/2019)   PRAPARE - Hydrologist (Medical): No    Lack of Transportation (Non-Medical): No  Physical Activity: Insufficiently Active (12/02/2019)   Exercise Vital Sign    Days of Exercise per Week: 2 days    Minutes of Exercise per Session: 20 min  Stress: Not on file  Social Connections: Moderately Isolated (12/02/2019)   Social Connection and Isolation Panel [NHANES]    Frequency of Communication with Friends and Family: More than three times a week    Frequency of Social Gatherings with Friends and Family: More than three times a week    Attends Religious  Services: More than 4 times per year    Active Member of [employer] or Organizations: No    Attends Archivist Meetings: Never    Marital Status: Divorced  Human resources officer Violence: Not At Risk (12/02/2019)   Humiliation, Afraid, Rape, and Kick questionnaire    Fear of Current or Ex-Partner: No    Emotionally Abused: No    Physically Abused:  No    Sexually Abused: No    Outpatient Medications Prior to Visit  Medication Sig Dispense Refill   Accu-Chek FastClix Lancets MISC USE TO TEST 4 TIMES DAILY. 102 each 0   ACCU-CHEK GUIDE test strip USE TO TEST 4 TIMES DAILY. 100 strip 0   aspirin EC 81 MG tablet Take 81 mg by mouth daily.     cetirizine (ZYRTEC) 10 MG tablet TAKE ONE TABLET ('10MG'$  TOTAL) BY MOUTH DAILY 30 tablet 2   Cholecalciferol (VITAMIN D-3) 1000 UNITS CAPS Take 1,000 Units by mouth daily.      Continuous Blood Gluc Sensor (DEXCOM G6 SENSOR) MISC Change sensor every 10 days as directed 9 each 3   Continuous Blood Gluc Transmit (DEXCOM G6 TRANSMITTER) MISC Change transmitter every 90 days as directed. 1 each 3   diclofenac Sodium (VOLTAREN) 1 % GEL APPLY 2 GRAMS TO AFFECTED AREAS 2 TIMES A DAY AS NEEDED 100 g 0   EPINEPHrine 0.3 mg/0.3 mL IJ SOAJ injection Inject 0.3 mg into the muscle as needed for anaphylaxis. 2 each 1   fluticasone (FLONASE) 50 MCG/ACT nasal spray Place 1 spray into both nostrils daily. 16 g 3   HYDROcodone-acetaminophen (NORCO) 7.5-325 MG tablet Take by mouth.     insulin aspart (NOVOLOG FLEXPEN) 100 UNIT/ML FlexPen Inject 20-26 Units into the skin 3 (three) times daily with meals. 60 mL 3   ketoconazole (NIZORAL) 2 % cream APPLY TO AFFECTED AREAS TWICE DAILY 60 g 2   LANTUS SOLOSTAR 100 UNIT/ML Solostar Pen Inject 60 Units into the skin at bedtime. 60 mL 3   levothyroxine (SYNTHROID) 75 MCG tablet TAKE ONE (1) TABLET BY MOUTH EVERY DAY BEFORE BREAKFAST 90 tablet 0   lisinopril (ZESTRIL) 20 MG tablet TAKE ONE TABLET ('20MG'$  TOTAL) BY MOUTH DAILY 90 tablet  1   LITETOUCH PEN NEEDLES 31G X 8 MM MISC USE AS DIRECTED FOUR TIMES DAILY. 100 each 0   metFORMIN (GLUCOPHAGE-XR) 500 MG 24 hr tablet TAKE ONE TABLET BY MOUTH TWICE A DAY AFTER A MEAL 180 tablet 0   pantoprazole (PROTONIX) 40 MG tablet TAKE ONE TABLET ('40MG'$  TOTAL) BY MOUTH DAILY 30 tablet 4   pregabalin (LYRICA) 150 MG capsule Take 150 mg by mouth 2 (two) times daily.     PROAIR HFA 108 (90 Base) MCG/ACT inhaler INHALE 4 PUFFS INTO THE LUNGS EVERY 6 HOURS AS NEEDED FOR WHEEZING OR SHORTNESS OF BREATH 18 g 1   Semaglutide, 1 MG/DOSE, 4 MG/3ML SOPN Inject 1 mg as directed once a week. 3 mL 3   triamcinolone cream (KENALOG) 0.1 % Apply 1 application topically 2 (two) times daily. 30 g 0   vitamin B-12 (CYANOCOBALAMIN) 500 MCG tablet Take 500 mcg by mouth daily.     No facility-administered medications prior to visit.    Allergies  Allergen Reactions   Kiwi Extract Anaphylaxis, Swelling and Palpitations   Other Anaphylaxis    Walnuts (avoids all tree nuts)    Review of Systems  Constitutional:  Negative for chills and fever.  HENT:  Positive for congestion, postnasal drip and sinus pressure. Negative for sore throat.        Right ear discomfort  Eyes:  Negative for pain and discharge.  Respiratory:  Negative for cough and shortness of breath.   Cardiovascular:  Negative for chest pain and palpitations.  Gastrointestinal:  Negative for abdominal pain, diarrhea, nausea and vomiting.  Endocrine: Negative for polydipsia and polyuria.  Genitourinary:  Negative for dysuria and  hematuria.  Musculoskeletal:  Positive for arthralgias and back pain. Negative for neck pain and neck stiffness.  Skin:  Positive for color change (Mole over back).  Neurological:  Negative for dizziness and weakness.  Psychiatric/Behavioral:  Negative for agitation and behavioral problems.        Objective:    Physical Exam Vitals reviewed.  Constitutional:      General: She is not in acute distress.     Appearance: She is obese. She is not diaphoretic.  HENT:     Head: Normocephalic and atraumatic.     Right Ear: Ear canal and external ear normal. There is no impacted cerumen.     Left Ear: Ear canal and external ear normal. There is no impacted cerumen.     Nose: Congestion present.     Mouth/Throat:     Mouth: Mucous membranes are moist.     Pharynx: No posterior oropharyngeal erythema.  Eyes:     General: No scleral icterus.    Extraocular Movements: Extraocular movements intact.  Cardiovascular:     Rate and Rhythm: Normal rate and regular rhythm.     Pulses: Normal pulses.     Heart sounds: Normal heart sounds. No murmur heard. Pulmonary:     Breath sounds: Normal breath sounds. No wheezing or rales.  Musculoskeletal:     Cervical back: Neck supple. No tenderness.     Right lower leg: No edema.     Left lower leg: No edema.  Skin:    General: Skin is warm.  Neurological:     General: No focal deficit present.     Mental Status: She is alert and oriented to person, place, and time.  Psychiatric:        Mood and Affect: Mood normal.        Behavior: Behavior normal.     BP 122/78 (BP Location: Left Arm, Patient Position: Sitting, Cuff Size: Normal)   Pulse 90   Resp 18   Ht 5' (1.524 m)   Wt 219 lb (99.3 kg)   SpO2 98%   BMI 42.77 kg/m  Wt Readings from Last 3 Encounters:  03/08/22 219 lb (99.3 kg)  02/20/22 225 lb (102.1 kg)  01/16/22 224 lb 12.8 oz (102 kg)        Assessment & Plan:   Problem List Items Addressed This Visit       Respiratory   Allergic rhinitis due to allergen    Takes Zyrtec 10 mg QD Added Norel for symptomatic relief Uses Flonase      Relevant Orders   POCT Influenza A/B (Completed)   Novel Coronavirus, NAA ([facility])   Other Visit Diagnoses     Acute non-recurrent maxillary sinusitis    -  Primary Negative rapid flu test Check COVID RT-PCR Started empiric azithromycin considering her history of recurrent sinusitis Flonase  for nasal congestion Advised to use humidifier and/or vaporizer for nasal congestion   Relevant Medications   azithromycin (ZITHROMAX) 250 MG tablet   Other Relevant Orders   POCT Influenza A/B (Completed)   Novel Coronavirus, NAA ([facility])        Meds ordered this encounter  Medications   azithromycin (ZITHROMAX) 250 MG tablet    Sig: Take 2 tablets on day 1, then 1 tablet daily on days 2 through 5    Dispense:  6 tablet    Refill:  0     Brianah Hopson Keith Rake, MD

## 2022-03-09 ENCOUNTER — Encounter (HOSPITAL_COMMUNITY)
Admission: RE | Disposition: A | Discharge status: Home / Self Care | Payer: Self-pay | Source: Home / Self Care | Attending: Ophthalmology

## 2022-03-09 ENCOUNTER — Encounter (HOSPITAL_COMMUNITY): Payer: Self-pay | Admitting: Ophthalmology

## 2022-03-09 ENCOUNTER — Ambulatory Visit (HOSPITAL_COMMUNITY)
Admission: RE | Admit: 2022-03-09 | Discharge: 2022-03-09 | Disposition: A | Discharge status: Home / Self Care | Payer: Medicaid Other | Attending: Ophthalmology | Admitting: Ophthalmology

## 2022-03-09 ENCOUNTER — Other Ambulatory Visit: Payer: Self-pay

## 2022-03-09 ENCOUNTER — Ambulatory Visit (HOSPITAL_BASED_OUTPATIENT_CLINIC_OR_DEPARTMENT_OTHER): Payer: Medicaid Other | Admitting: Anesthesiology

## 2022-03-09 ENCOUNTER — Ambulatory Visit (HOSPITAL_COMMUNITY): Payer: Medicaid Other | Admitting: Anesthesiology

## 2022-03-09 DIAGNOSIS — H02831 Dermatochalasis of right upper eyelid: Secondary | ICD-10-CM | POA: Diagnosis not present

## 2022-03-09 DIAGNOSIS — H2181 Floppy iris syndrome: Secondary | ICD-10-CM

## 2022-03-09 DIAGNOSIS — K219 Gastro-esophageal reflux disease without esophagitis: Secondary | ICD-10-CM | POA: Diagnosis not present

## 2022-03-09 DIAGNOSIS — Z79899 Other long term (current) drug therapy: Secondary | ICD-10-CM | POA: Insufficient documentation

## 2022-03-09 DIAGNOSIS — E039 Hypothyroidism, unspecified: Secondary | ICD-10-CM | POA: Diagnosis not present

## 2022-03-09 DIAGNOSIS — E1136 Type 2 diabetes mellitus with diabetic cataract: Secondary | ICD-10-CM | POA: Diagnosis present

## 2022-03-09 DIAGNOSIS — Z794 Long term (current) use of insulin: Secondary | ICD-10-CM | POA: Insufficient documentation

## 2022-03-09 DIAGNOSIS — J449 Chronic obstructive pulmonary disease, unspecified: Secondary | ICD-10-CM

## 2022-03-09 DIAGNOSIS — Z6841 Body Mass Index (BMI) 40.0 and over, adult: Secondary | ICD-10-CM | POA: Diagnosis not present

## 2022-03-09 DIAGNOSIS — H25812 Combined forms of age-related cataract, left eye: Secondary | ICD-10-CM | POA: Insufficient documentation

## 2022-03-09 DIAGNOSIS — M1990 Unspecified osteoarthritis, unspecified site: Secondary | ICD-10-CM | POA: Diagnosis not present

## 2022-03-09 DIAGNOSIS — I10 Essential (primary) hypertension: Secondary | ICD-10-CM | POA: Diagnosis not present

## 2022-03-09 DIAGNOSIS — K449 Diaphragmatic hernia without obstruction or gangrene: Secondary | ICD-10-CM | POA: Insufficient documentation

## 2022-03-09 DIAGNOSIS — H02834 Dermatochalasis of left upper eyelid: Secondary | ICD-10-CM | POA: Insufficient documentation

## 2022-03-09 DIAGNOSIS — H0100B Unspecified blepharitis left eye, upper and lower eyelids: Secondary | ICD-10-CM | POA: Insufficient documentation

## 2022-03-09 DIAGNOSIS — H52223 Regular astigmatism, bilateral: Secondary | ICD-10-CM | POA: Diagnosis not present

## 2022-03-09 DIAGNOSIS — J4489 Other specified chronic obstructive pulmonary disease: Secondary | ICD-10-CM | POA: Insufficient documentation

## 2022-03-09 DIAGNOSIS — H0100A Unspecified blepharitis right eye, upper and lower eyelids: Secondary | ICD-10-CM | POA: Insufficient documentation

## 2022-03-09 DIAGNOSIS — Z7984 Long term (current) use of oral hypoglycemic drugs: Secondary | ICD-10-CM | POA: Insufficient documentation

## 2022-03-09 DIAGNOSIS — H11153 Pinguecula, bilateral: Secondary | ICD-10-CM | POA: Diagnosis not present

## 2022-03-09 DIAGNOSIS — H5203 Hypermetropia, bilateral: Secondary | ICD-10-CM | POA: Diagnosis not present

## 2022-03-09 DIAGNOSIS — G709 Myoneural disorder, unspecified: Secondary | ICD-10-CM | POA: Insufficient documentation

## 2022-03-09 DIAGNOSIS — Z7985 Long-term (current) use of injectable non-insulin antidiabetic drugs: Secondary | ICD-10-CM | POA: Insufficient documentation

## 2022-03-09 HISTORY — PX: CATARACT EXTRACTION W/PHACO: SHX586

## 2022-03-09 LAB — GLUCOSE, CAPILLARY: Glucose-Capillary: 160 mg/dL — ABNORMAL HIGH (ref 70–99)

## 2022-03-09 SURGERY — PHACOEMULSIFICATION, CATARACT, WITH IOL INSERTION
Anesthesia: Monitor Anesthesia Care | Site: Eye | Laterality: Left

## 2022-03-09 MED ORDER — PHENYLEPHRINE HCL 2.5 % OP SOLN
1.0000 [drp] | OPHTHALMIC | Status: AC | PRN
  Administered 2022-03-09 (×3): 1 [drp] via OPHTHALMIC

## 2022-03-09 MED ORDER — TETRACAINE HCL 0.5 % OP SOLN
1.0000 [drp] | OPHTHALMIC | Status: AC | PRN
  Administered 2022-03-09 (×3): 1 [drp] via OPHTHALMIC

## 2022-03-09 MED ORDER — MIDAZOLAM HCL 2 MG/2ML IJ SOLN
INTRAMUSCULAR | Status: DC | PRN
  Administered 2022-03-09: 1 mg via INTRAVENOUS

## 2022-03-09 MED ORDER — SODIUM HYALURONATE 10 MG/ML IO SOLUTION
PREFILLED_SYRINGE | INTRAOCULAR | Status: DC | PRN
  Administered 2022-03-09: 0.85 mL via INTRAOCULAR

## 2022-03-09 MED ORDER — POVIDONE-IODINE 5 % OP SOLN
OPHTHALMIC | Status: DC | PRN
  Administered 2022-03-09: 1 via OPHTHALMIC

## 2022-03-09 MED ORDER — LIDOCAINE HCL (PF) 1 % IJ SOLN
INTRAOCULAR | Status: DC | PRN
  Administered 2022-03-09: 1 mL via OPHTHALMIC

## 2022-03-09 MED ORDER — STERILE WATER FOR IRRIGATION IR SOLN
Status: DC | PRN
  Administered 2022-03-09: 50 mL

## 2022-03-09 MED ORDER — PHENYLEPHRINE-KETOROLAC 1-0.3 % IO SOLN
INTRAOCULAR | Status: AC
  Filled 2022-03-09: qty 4

## 2022-03-09 MED ORDER — BSS IO SOLN
INTRAOCULAR | Status: DC | PRN
  Administered 2022-03-09: 15 mL via INTRAOCULAR

## 2022-03-09 MED ORDER — FENTANYL CITRATE (PF) 100 MCG/2ML IJ SOLN
INTRAMUSCULAR | Status: AC
  Filled 2022-03-09: qty 2

## 2022-03-09 MED ORDER — PHENYLEPHRINE-KETOROLAC 1-0.3 % IO SOLN
INTRAOCULAR | Status: DC | PRN
  Administered 2022-03-09: 500 mL via OPHTHALMIC

## 2022-03-09 MED ORDER — SODIUM HYALURONATE 23MG/ML IO SOSY
PREFILLED_SYRINGE | INTRAOCULAR | Status: DC | PRN
  Administered 2022-03-09: 0.6 mL via INTRAOCULAR

## 2022-03-09 MED ORDER — PROPOFOL 10 MG/ML IV BOLUS
INTRAVENOUS | Status: AC
  Filled 2022-03-09: qty 20

## 2022-03-09 MED ORDER — EPINEPHRINE PF 1 MG/ML IJ SOLN
INTRAMUSCULAR | Status: AC
  Filled 2022-03-09: qty 2

## 2022-03-09 MED ORDER — TROPICAMIDE 1 % OP SOLN
1.0000 [drp] | OPHTHALMIC | Status: AC | PRN
  Administered 2022-03-09 (×3): 1 [drp] via OPHTHALMIC

## 2022-03-09 MED ORDER — MIDAZOLAM HCL 2 MG/2ML IJ SOLN
INTRAMUSCULAR | Status: AC
  Filled 2022-03-09: qty 2

## 2022-03-09 MED ORDER — LIDOCAINE HCL 3.5 % OP GEL: 1.0000 | Freq: Once | OPHTHALMIC | Status: DC

## 2022-03-09 MED ORDER — MOXIFLOXACIN HCL 0.5 % OP SOLN
OPHTHALMIC | Status: DC | PRN
  Administered 2022-03-09: 2 [drp] via OPHTHALMIC

## 2022-03-09 SURGICAL SUPPLY — 16 items
CATARACT SUITE SIGHTPATH (MISCELLANEOUS) ×1 IMPLANT
CLOTH BEACON ORANGE TIMEOUT ST (SAFETY) ×1 IMPLANT
DRAPE HALF SHEET 40X57 (DRAPES) IMPLANT
EYE SHIELD UNIVERSAL CLEAR (GAUZE/BANDAGES/DRESSINGS) IMPLANT
FEE CATARACT SUITE SIGHTPATH (MISCELLANEOUS) ×1 IMPLANT
GLOVE BIOGEL PI IND STRL 7.0 (GLOVE) ×2 IMPLANT
GLOVE SURG SS PI 7.0 STRL IVOR (GLOVE) IMPLANT
LENS IOL RAYNER 20.5 (Intraocular Lens) ×1 IMPLANT
LENS IOL RAYONE EMV 20.5 (Intraocular Lens) IMPLANT
NDL HYPO 18GX1.5 BLUNT FILL (NEEDLE) ×1 IMPLANT
NEEDLE HYPO 18GX1.5 BLUNT FILL (NEEDLE) ×1 IMPLANT
PAD ARMBOARD 7.5X6 YLW CONV (MISCELLANEOUS) ×1 IMPLANT
RING MALYGIN 7.0 (MISCELLANEOUS) IMPLANT
SYR TB 1ML LL NO SAFETY (SYRINGE) ×1 IMPLANT
TAPE SURG TRANSPORE 1 IN (GAUZE/BANDAGES/DRESSINGS) IMPLANT
TAPE SURGICAL TRANSPORE 1 IN (GAUZE/BANDAGES/DRESSINGS) ×1

## 2022-03-09 NOTE — Interval H&P Note (Signed)
History and Physical Interval Note:  03/09/2022 9:36 AM  Sandy Valencia  has presented today for surgery, with the diagnosis of combined forms age related cataract; left.  The various methods of treatment have been discussed with the patient and family. After consideration of risks, benefits and other options for treatment, the patient has consented to  Procedure(s) with comments: CATARACT EXTRACTION PHACO AND INTRAOCULAR LENS PLACEMENT ([place]) (Left) - CDE as a surgical intervention.  The patient's history has been reviewed, patient examined, no change in status, stable for surgery.  I have reviewed the patient's chart and labs.  Questions were answered to the patient's satisfaction.     Baruch Goldmann

## 2022-03-09 NOTE — Anesthesia Postprocedure Evaluation (Signed)
Anesthesia Post Note  Patient: Sandy Valencia  Procedure(s) Performed: CATARACT EXTRACTION PHACO AND INTRAOCULAR LENS PLACEMENT ([facility]) (Left: Eye)  Patient location during evaluation: Phase II Anesthesia Type: MAC Level of consciousness: awake and alert and oriented Pain management: pain level controlled Vital Signs Assessment: post-procedure vital signs reviewed and stable Respiratory status: spontaneous breathing, nonlabored ventilation and respiratory function stable Cardiovascular status: stable and blood pressure returned to baseline Postop Assessment: no apparent nausea or vomiting Anesthetic complications: no   No notable events documented.   Last Vitals:  Vitals:   03/09/22 0907 03/09/22 1005  BP: 131/75 (!) 152/69  Pulse: 61 65  Resp: 12 12  Temp: 36.6 C 36.5 C  SpO2: 99% 99%    Last Pain:  Vitals:   03/09/22 1005  TempSrc: Oral  PainSc: 2                  Mikell Camp C Annie Saephan

## 2022-03-09 NOTE — Discharge Instructions (Addendum)
Please discharge patient when stable, will follow up today with Dr. Wrzosek at the [facility] [place] office immediately following discharge.  Leave shield in place until visit.  All paperwork with discharge instructions will be given at the office.  [facility] Address:  [address]  [place], [place] [address]  

## 2022-03-09 NOTE — Transfer of Care (Signed)
Immediate Anesthesia Transfer of Care Note  Patient: Sandy Valencia  Procedure(s) Performed: CATARACT EXTRACTION PHACO AND INTRAOCULAR LENS PLACEMENT ([facility]) (Left: Eye)  Patient Location: PACU  Anesthesia Type:MAC  Level of Consciousness: awake, alert  and oriented  Airway & Oxygen Therapy: Patient Spontanous Breathing  Post-op Assessment: Report given to RN and Post -op Vital signs reviewed and stable  Post vital signs: Reviewed and stable  Last Vitals:  Vitals Value Taken Time  BP 152/69 03/09/22 1005  Temp 36.5 C 03/09/22 1005  Pulse 65 03/09/22 1005  Resp 12 03/09/22 1005  SpO2 99 % 03/09/22 1005    Last Pain:  Vitals:   03/09/22 1005  TempSrc: Oral  PainSc: 2       Patients Stated Pain Goal: 5 (38/88/28 0034)  Complications: No notable events documented.

## 2022-03-09 NOTE — Op Note (Signed)
Date of procedure: 03/09/22  Pre-operative diagnosis: Visually significant age-related combined cataract, Left Eye; Poor dilation, Left eye (H25.812)   Post-operative diagnosis: Visually significant age-related cataract, Left Eye; Intra-operative Floppy Iris Syndrome, Left Eye (H21.81)  Procedure: Complex removal of cataract via phacoemulsification and insertion of intra-ocular lens Rayner [id number] +20.5D into the capsular bag of the Left Eye (CPT [phone])  Attending surgeon: Gerda Diss. Khai Arrona, MD, MA  Anesthesia: MAC, Topical Akten  Complications: None  Estimated Blood Loss: <38m (minimal)  Specimens: None  Implants: As above  Indications:  Visually significant cataract, Left Eye  Procedure:  The patient was seen and identified in the pre-operative area. The operative eye was identified and dilated.  The operative eye was marked.  Topical anesthesia was administered to the operative eye.     The patient was then to the operative suite and placed in the supine position.  A timeout was performed confirming the patient, procedure to be performed, and all other relevant information.   The patient's face was prepped and draped in the usual fashion for intra-ocular surgery.  A lid speculum was placed into the operative eye and the surgical microscope moved into place and focused.  Poor dilation of the iris was confirmed.  An inferotemporal paracentesis was created using a 20 gauge paracentesis blade.  Shugarcaine was injected into the anterior chamber.  Viscoelastic was injected into the anterior chamber.  A temporal clear-corneal main wound incision was created using a 2.422mmicrokeratome.  A Malyugin ring was placed.  A continuous curvilinear capsulorrhexis was initiated using an irrigating cystitome and completed using capsulorrhexis forceps.  Hydrodissection and hydrodeliniation were performed.  Viscoelastic was injected into the anterior chamber.  A phacoemulsification handpiece and a chopper as  a second instrument were used to remove the nucleus and epinucleus. The irrigation/aspiration handpiece was used to remove any remaining cortical material.   The capsular bag was reinflated with viscoelastic, checked, and found to be intact.  The intraocular lens was inserted into the capsular bag and dialed into place using a MaSurveyor, mineralsThe Malyugin ring was removed.  The irrigation/aspiration handpiece was used to remove any remaining viscoelastic.  The clear corneal wound and paracentesis wounds were then hydrated and checked with Weck-Cels to be watertight.  The lid-speculum and drape was removed, and the patient's face was cleaned with a wet and dry 4x4.  Moxifloxacin drops were instilled on the eye. A clear shield was taped over the eye. The patient was taken to the post-operative care unit in good condition, having tolerated the procedure well.  Post-Op Instructions: [facility] and will receive all other orders and instructions.

## 2022-03-09 NOTE — Anesthesia Preprocedure Evaluation (Signed)
Anesthesia Evaluation  Patient identified by MRN, date of birth, ID band Patient awake    Reviewed: Allergy & Precautions, NPO status , Patient's Chart, lab work & pertinent test results  Airway Mallampati: III  TM Distance: >3 FB Neck ROM: Full    Dental  (+) Dental Advisory Given, Missing   Pulmonary asthma , sleep apnea , COPD,    breath sounds clear to auscultation       Cardiovascular Exercise Tolerance: Good hypertension, Pt. on medications Normal cardiovascular exam Rhythm:Regular Rate:Normal     Neuro/Psych  Neuromuscular disease negative psych ROS   GI/Hepatic Neg liver ROS, hiatal hernia, GERD  Medicated and Controlled,  Endo/Other  diabetes, Well Controlled, Type 2, Oral Hypoglycemic Agents, Insulin DependentHypothyroidism Morbid obesity  Renal/GU negative Renal ROS  negative genitourinary   Musculoskeletal  (+) Arthritis , Osteoarthritis,    Abdominal   Peds negative pediatric ROS (+)  Hematology  (+) Blood dyscrasia, anemia ,   Anesthesia Other Findings   Reproductive/Obstetrics negative OB ROS                            Anesthesia Physical Anesthesia Plan  ASA: 3  Anesthesia Plan: MAC   Post-op Pain Management: Minimal or no pain anticipated   Induction: Intravenous  PONV Risk Score and Plan:   Airway Management Planned: Natural Airway and Nasal Cannula  Additional Equipment:   Intra-op Plan:   Post-operative Plan:   Informed Consent: I have reviewed the patients History and Physical, chart, labs and discussed the procedure including the risks, benefits and alternatives for the proposed anesthesia with the patient or authorized representative who has indicated his/her understanding and acceptance.     Dental advisory given  Plan Discussed with: CRNA and Surgeon  Anesthesia Plan Comments:         Anesthesia Quick Evaluation

## 2022-03-10 LAB — NOVEL CORONAVIRUS, NAA: SARS-CoV-2, NAA: NOT DETECTED

## 2022-03-12 ENCOUNTER — Other Ambulatory Visit: Payer: Self-pay | Admitting: Internal Medicine

## 2022-03-13 ENCOUNTER — Encounter (HOSPITAL_COMMUNITY): Payer: Self-pay | Admitting: Ophthalmology

## 2022-03-13 DIAGNOSIS — H95121 Granulation of postmastoidectomy cavity, right ear: Secondary | ICD-10-CM | POA: Diagnosis not present

## 2022-03-14 ENCOUNTER — Telehealth: Payer: Self-pay | Admitting: Internal Medicine

## 2022-03-14 NOTE — Telephone Encounter (Signed)
Pt advised of lab results with verbal understanding  

## 2022-03-14 NOTE — Telephone Encounter (Signed)
Pt called in for lab results  °

## 2022-03-15 ENCOUNTER — Encounter: Payer: Self-pay | Admitting: Internal Medicine

## 2022-03-15 ENCOUNTER — Ambulatory Visit (INDEPENDENT_AMBULATORY_CARE_PROVIDER_SITE_OTHER): Payer: Medicaid Other | Admitting: Internal Medicine

## 2022-03-15 DIAGNOSIS — J329 Chronic sinusitis, unspecified: Secondary | ICD-10-CM | POA: Diagnosis not present

## 2022-03-15 DIAGNOSIS — R42 Dizziness and giddiness: Secondary | ICD-10-CM | POA: Diagnosis not present

## 2022-03-15 MED ORDER — MECLIZINE HCL 25 MG PO TABS: 25.0000 mg | ORAL_TABLET | Freq: Two times a day (BID) | ORAL | 0 refills | Status: DC | PRN

## 2022-03-15 NOTE — Progress Notes (Signed)
Virtual Visit via Telephone Note   This visit type was conducted via telephone. This format is felt to be most appropriate for this patient at this time.  The patient did not have access to video technology/had technical difficulties with video requiring transitioning to audio format only (telephone).  All issues noted in this document were discussed and addressed.  No physical exam could be performed with this format.  Evaluation Performed:  Follow-up visit  Date:  03/15/2022   ID:  Sandy Valencia, Sandy Valencia 1960-12-02, MRN [id number]  Patient Location: Home Provider Location: Office/Clinic  Participants: Patient Location of Patient: Home Location of Provider: Telehealth Consent was obtain for visit to be over via telehealth. I verified that I am speaking with the correct person using two identifiers.  PCP:  Lindell Spar, MD   Chief Complaint: Dizziness and chronic nasal congestion  History of Present Illness:    Sandy Valencia is a 61 y.o. female who has a televisit for complaint of dizziness and ear fullness for the last few days.  She was recently treated for acute sinusitis with azithromycin.  She denies any fever or chills currently.  She went to see ENT specialist and was told that she does not have ear infection.  She denies any ear discharge or pain currently.  Of note, she recently had cataract surgery, but denies any blurred vision currently.  She had follow up with ophthalmologist and was told that she is recovering well.  The patient does not have symptoms concerning for COVID-19 infection (fever, chills, cough, or new shortness of breath).   Past Medical, Surgical, Social History, Allergies, and Medications have been Reviewed.  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   Anaphylactic shock, unspecified, subsequent encounter    Anemia, iron deficiency 02/14/2015   Angio-edema     Arthritis    Asthma    Asymptomatic varicose veins of right lower extremity    Chronic obstructive pulmonary disease with (acute) exacerbation (HCC)    Chronic rhinitis 05/21/2018   COVID-19    Diabetes mellitus    Essential (primary) hypertension    Family history of colon cancer 09/03/2012   Fatty liver    Gastro-esophageal reflux disease without esophagitis    GERD (gastroesophageal reflux disease)    History of colonic polyps 08/26/2017   Hypercholesteremia    Hypertension    Hypothyroidism    Low back pain    Mass of finger, right s/p surgical excision (mucoid cyst) 04/17/18 04/22/2018   Mucous cyst of digit of right hand    Obesity, unspecified    Other adverse food reactions, not elsewhere classified, initial encounter    Personal history of noncompliance with medical treatment, presenting hazards to health 05/18/2015   Rash and other nonspecific skin eruption    Sleep apnea    pt siad, "i had small amount but could not tolerate CPAP and they said it was ok since it was mild.   Sleep apnea, unspecified    Unspecified asthma, uncomplicated    Zoster without complications    Past Surgical History:  Procedure Laterality Date   ABDOMINAL HYSTERECTOMY     CATARACT EXTRACTION W/PHACO Left 03/09/2022   Procedure: CATARACT EXTRACTION PHACO AND INTRAOCULAR LENS PLACEMENT ([place]);  Surgeon: Baruch Goldmann, MD;  Location: [facility];  Service: Ophthalmology;  Laterality: Left;  CDE 7.89   CHOLECYSTECTOMY  COLONOSCOPY N/A 09/22/2012   RMR: colonic polyps -removed as described above. tubular adenoma, next TCS 09/2017   COLONOSCOPY WITH PROPOFOL N/A 09/23/2017   Procedure: COLONOSCOPY WITH PROPOFOL;  Surgeon: Daneil Dolin, MD;  Location: [facility];  Service: Endoscopy;  Laterality: N/A;  7:30am   CYST EXCISION Left 07/19/2016   Procedure: CYST REMOVAL LEFT RING FINGER;  Surgeon: Carole Civil, MD;  Location: [facility];  Service: Orthopedics;  Laterality: Left;   CYST REMOVAL LEG  Right    foot   ESOPHAGOGASTRODUODENOSCOPY N/A 10/21/2014   RMR: Small hiatal hernia; otherwise normal EGD status post passage of a Maloney dilator.    EXCISION MASS UPPER EXTREMETIES Right 04/17/2018   Procedure: EXCISION MASS UPPER EXTREMETIES right ring finger;  Surgeon: Carole Civil, MD;  Location: [facility];  Service: Orthopedics;  Laterality: Right;   MALONEY DILATION N/A 10/21/2014   Procedure: [name] DILATION;  Surgeon: Daneil Dolin, MD;  Location: [facility];  Service: Endoscopy;  Laterality: N/A;   MIDDLE EAR SURGERY Right    patched hole in ear drum   POLYPECTOMY  09/23/2017   Procedure: POLYPECTOMY;  Surgeon: Daneil Dolin, MD;  Location: [facility];  Service: Endoscopy;;  polyp hepatic flexure polyp cs, splenic flexure polyp cs, rectal polyp cs     Current Meds  Medication Sig   Accu-Chek FastClix Lancets MISC USE TO TEST 4 TIMES DAILY.   ACCU-CHEK GUIDE test strip USE TO TEST 4 TIMES DAILY.   aspirin EC 81 MG tablet Take 81 mg by mouth daily.   cetirizine (ZYRTEC) 10 MG tablet TAKE ONE TABLET ('10MG'$  TOTAL) BY MOUTH DAILY   Cholecalciferol (VITAMIN D-3) 1000 UNITS CAPS Take 1,000 Units by mouth daily.    Continuous Blood Gluc Sensor (DEXCOM G6 SENSOR) MISC Change sensor every 10 days as directed   Continuous Blood Gluc Transmit (DEXCOM G6 TRANSMITTER) MISC Change transmitter every 90 days as directed.   diclofenac Sodium (VOLTAREN) 1 % GEL APPLY 2 GRAMS TO AFFECTED AREAS 2 TIMES A DAY AS NEEDED   EPINEPHrine 0.3 mg/0.3 mL IJ SOAJ injection Inject 0.3 mg into the muscle as needed for anaphylaxis.   fluticasone (FLONASE) 50 MCG/ACT nasal spray Place 1 spray into both nostrils daily.   HYDROcodone-acetaminophen (NORCO) 7.5-325 MG tablet Take by mouth.   insulin aspart (NOVOLOG FLEXPEN) 100 UNIT/ML FlexPen Inject 20-26 Units into the skin 3 (three) times daily with meals.   ketoconazole (NIZORAL) 2 % cream APPLY TO AFFECTED AREAS TWICE DAILY   LANTUS SOLOSTAR 100  UNIT/ML Solostar Pen Inject 60 Units into the skin at bedtime.   levothyroxine (SYNTHROID) 75 MCG tablet TAKE ONE (1) TABLET BY MOUTH EVERY DAY BEFORE BREAKFAST   lisinopril (ZESTRIL) 20 MG tablet TAKE ONE TABLET ('20MG'$  TOTAL) BY MOUTH DAILY   LITETOUCH PEN NEEDLES 31G X 8 MM MISC USE AS DIRECTED FOUR TIMES DAILY.   metFORMIN (GLUCOPHAGE-XR) 500 MG 24 hr tablet TAKE ONE TABLET BY MOUTH TWICE A DAY AFTER A MEAL   pantoprazole (PROTONIX) 40 MG tablet TAKE ONE TABLET ('40MG'$  TOTAL) BY MOUTH DAILY   pregabalin (LYRICA) 150 MG capsule Take 150 mg by mouth 2 (two) times daily.   PROAIR HFA 108 (90 Base) MCG/ACT inhaler INHALE 4 PUFFS INTO THE LUNGS EVERY 6 HOURS AS NEEDED FOR WHEEZING OR SHORTNESS OF BREATH   Semaglutide, 1 MG/DOSE, 4 MG/3ML SOPN Inject 1 mg as directed once a week.   triamcinolone cream (KENALOG) 0.1 % Apply 1 application  topically 2 (two) times daily.   vitamin B-12 (CYANOCOBALAMIN) 500 MCG tablet Take 500 mcg by mouth daily.     Allergies:   Kiwi extract and Other   ROS:   Please see the history of present illness.     All other systems reviewed and are negative.   Labs/Other Tests and Data Reviewed:    Recent Labs: 01/16/2022: ALT 40; BUN 13; Creatinine, Ser 0.76; Hemoglobin 14.0; Platelets 260; Potassium 4.1; Sodium 140; TSH 1.050   Recent Lipid Panel Lab Results  Component Value Date/Time   CHOL 160 01/16/2022 09:31 AM   TRIG 71 01/16/2022 09:31 AM   HDL 55 01/16/2022 09:31 AM   CHOLHDL 2.9 01/16/2022 09:31 AM   CHOLHDL 2.4 12/25/2019 07:09 AM   LDLCALC 91 01/16/2022 09:31 AM   LDLCALC 65 12/25/2019 07:09 AM    Wt Readings from Last 3 Encounters:  03/09/22 218 lb 4.1 oz (99 kg)  03/08/22 219 lb (99.3 kg)  02/20/22 225 lb (102.1 kg)     ASSESSMENT & PLAN:    Vertigo Chronic sinusitis Her dizziness and ear fullness with nausea likely due to vertigo Meclizine as needed for dizziness Avoid sudden positional changes Advised to maintain adequate hydration and  avoid skipping meals On Zyrtec for allergies Flonase for nasal congestion Sudafed for nasal congestion only for short-term  Time:   Today, I have spent 9 minutes reviewing the chart, including problem list, medications, and with the patient with telehealth technology discussing the above problems.   Medication Adjustments/Labs and Tests Ordered: Current medicines are reviewed at length with the patient today.  Concerns regarding medicines are outlined above.   Tests Ordered: No orders of the defined types were placed in this encounter.   Medication Changes: No orders of the defined types were placed in this encounter.    Note: This dictation was prepared with Dragon dictation along with smaller phrase technology. Similar sounding words can be transcribed inadequately or may not be corrected upon review. Any transcriptional errors that result from this process are unintentional.      Disposition:  Follow up  Signed, Lindell Spar, MD  03/15/2022 4:32 PM     [facility]

## 2022-03-16 ENCOUNTER — Telehealth: Payer: Self-pay | Admitting: Nurse Practitioner

## 2022-03-16 ENCOUNTER — Encounter (HOSPITAL_COMMUNITY)
Admission: RE | Admit: 2022-03-16 | Discharge: 2022-03-16 | Disposition: A | Discharge status: Home / Self Care | Payer: Medicaid Other | Source: Ambulatory Visit | Attending: Ophthalmology | Admitting: Ophthalmology

## 2022-03-16 NOTE — Telephone Encounter (Signed)
New message   The patient DM machine stop working need a new prescription called in    1. Which medications need to be refilled? (please list name of each medication and dose if known)Accu check machine   Accu-Chek FastClix Lancets MISC ACCU-CHEK GUIDE test strip  2. Which pharmacy/location (including street and city if local pharmacy) is medication to be sent to?[facility]   3. Do they need a 30 day or 90 day supply?

## 2022-03-16 NOTE — Telephone Encounter (Signed)
She can just come by here and pick up one of the sample meters (they are the same).

## 2022-03-19 NOTE — Telephone Encounter (Signed)
Talked with the patient. She states that she did get what she need, which she states is a meter.

## 2022-03-19 NOTE — H&P (Signed)
Surgical History & Physical  Patient Name: Sandy Valencia DOB: 1961-01-07  Surgery: Cataract extraction with intraocular lens implant phacoemulsification; Right Eye  Surgeon: Baruch Goldmann MD Surgery Date:  03-23-22 Pre-Op Date:  03-15-22  HPI: A 36 Yr. old female patient 1. The patient is returning 6 days after cataract surgery. The left eye is affected. Since the last visit, the affected area is doing well. The patient's vision is improved. Patient is following medication instructions. 2. 2. The patient is returning for a cataract follow-up of the right eye. Since the last visit, the affected area is worsening. The patient's vision is blurry. The complaint is associated with difficulty with glare on bright sunny days, difficulty recognizing people at a distance, and poor night vision. This is negatively affecting the patient's quality of life and the patient is unable to function adequately in life with the current level of vision. HPI Completed by Dr. Baruch Goldmann  Medical History: Cataracts Diabetes High Blood Pressure LDL  Review of Systems Negative Allergic/Immunologic Negative Cardiovascular Negative Constitutional Negative Ear, Nose, Mouth & Throat Negative Endocrine Negative Eyes Negative Gastrointestinal Negative Genitourinary Negative Hemotologic/Lymphatic Negative Integumentary Negative Musculoskeletal Negative Neurological Negative Psychiatry Negative Respiratory  Social   Never smoked   Medication Moxifloxacin, Ilevro, Prednisolone acetate 1%,  Aspirin, Cetirizine, Diclofenac Sodium, Fluticasone, Hydrocodone-Acetaminophenj, Fluticasone, Ketoconazole Cream, Lantus, Levothyroxine Sodium, Lisinopril, Metformin, NovoLog, Pantoprazole, Pregabalin, Proair respiClick, Semagutide inj, Vitamin B-12, Vitamin D3, Ozempic,   Sx/Procedures Phaco c IOL OS,  Ear sling, Gallbladder Sx, Hysterectomy,   Drug Allergies   NKDA  History & Physical: Heent: Cataract, right  eye NECK: supple without bruits LUNGS: lungs clear to auscultation CV: regular rate and rhythm Abdomen: soft and non-tender Impression & Plan: Assessment: 1.  CATARACT EXTRACTION STATUS; Left Eye (Z98.42) 2.  COMBINED FORMS AGE RELATED CATARACT; Right Eye (H25.811)  Plan: 1.  1 week after cataract surgery. Doing well with improved vision and normal eye pressure. Call with any problems or concerns. Stop Vigamox. Continue Ilevro 1 drop 1x/day for 3 more weeks. Continue Pred Acetate 1 drop 2x/day for 3 more weeks.  2.  Cataract accounts for the patient's decreased vision. This visual impairment is not correctable with a tolerable change in glasses or contact lenses. Cataract surgery with an implantation of a new lens should significantly improve the visual and functional status of the patient. Discussed all risks, benefits, alternatives, and potential complications. Discussed the procedures and recovery. Patient desires to have surgery. A-scan ordered and performed today for intra-ocular lens calculations. The surgery will be performed in order to improve vision for driving, reading, and for eye examinations. Recommend phacoemulsification with intra-ocular lens. Recommend Dextenza for post-operative pain and inflammation. Right Eye. Surgery required to correct imbalance of vision. Dilates poorly - shugarcaine by protocol. Malyugin Ring. Omidira.

## 2022-03-20 ENCOUNTER — Encounter (HOSPITAL_COMMUNITY): Payer: Self-pay

## 2022-03-21 ENCOUNTER — Other Ambulatory Visit: Payer: Self-pay | Admitting: Nurse Practitioner

## 2022-03-23 ENCOUNTER — Ambulatory Visit (HOSPITAL_COMMUNITY): Payer: Medicaid Other | Admitting: Anesthesiology

## 2022-03-23 ENCOUNTER — Encounter (HOSPITAL_COMMUNITY): Payer: Self-pay | Admitting: Ophthalmology

## 2022-03-23 ENCOUNTER — Ambulatory Visit (HOSPITAL_COMMUNITY)
Admission: RE | Admit: 2022-03-23 | Discharge: 2022-03-23 | Disposition: A | Discharge status: Home / Self Care | Payer: Medicaid Other | Attending: Ophthalmology | Admitting: Ophthalmology

## 2022-03-23 ENCOUNTER — Ambulatory Visit (HOSPITAL_BASED_OUTPATIENT_CLINIC_OR_DEPARTMENT_OTHER): Payer: Medicaid Other | Admitting: Anesthesiology

## 2022-03-23 ENCOUNTER — Encounter (HOSPITAL_COMMUNITY)
Admission: RE | Disposition: A | Discharge status: Home / Self Care | Payer: Self-pay | Source: Home / Self Care | Attending: Ophthalmology

## 2022-03-23 DIAGNOSIS — Z7984 Long term (current) use of oral hypoglycemic drugs: Secondary | ICD-10-CM | POA: Insufficient documentation

## 2022-03-23 DIAGNOSIS — I10 Essential (primary) hypertension: Secondary | ICD-10-CM | POA: Insufficient documentation

## 2022-03-23 DIAGNOSIS — G4730 Sleep apnea, unspecified: Secondary | ICD-10-CM | POA: Insufficient documentation

## 2022-03-23 DIAGNOSIS — E039 Hypothyroidism, unspecified: Secondary | ICD-10-CM | POA: Diagnosis not present

## 2022-03-23 DIAGNOSIS — Z6841 Body Mass Index (BMI) 40.0 and over, adult: Secondary | ICD-10-CM | POA: Insufficient documentation

## 2022-03-23 DIAGNOSIS — Z79899 Other long term (current) drug therapy: Secondary | ICD-10-CM | POA: Insufficient documentation

## 2022-03-23 DIAGNOSIS — H25811 Combined forms of age-related cataract, right eye: Secondary | ICD-10-CM | POA: Insufficient documentation

## 2022-03-23 DIAGNOSIS — Z794 Long term (current) use of insulin: Secondary | ICD-10-CM | POA: Diagnosis not present

## 2022-03-23 DIAGNOSIS — E1136 Type 2 diabetes mellitus with diabetic cataract: Secondary | ICD-10-CM | POA: Diagnosis not present

## 2022-03-23 DIAGNOSIS — G709 Myoneural disorder, unspecified: Secondary | ICD-10-CM | POA: Insufficient documentation

## 2022-03-23 DIAGNOSIS — M1990 Unspecified osteoarthritis, unspecified site: Secondary | ICD-10-CM | POA: Insufficient documentation

## 2022-03-23 DIAGNOSIS — J449 Chronic obstructive pulmonary disease, unspecified: Secondary | ICD-10-CM

## 2022-03-23 DIAGNOSIS — H2181 Floppy iris syndrome: Secondary | ICD-10-CM | POA: Diagnosis not present

## 2022-03-23 DIAGNOSIS — K219 Gastro-esophageal reflux disease without esophagitis: Secondary | ICD-10-CM | POA: Diagnosis not present

## 2022-03-23 HISTORY — PX: CATARACT EXTRACTION W/PHACO: SHX586

## 2022-03-23 LAB — GLUCOSE, CAPILLARY: Glucose-Capillary: 151 mg/dL — ABNORMAL HIGH (ref 70–99)

## 2022-03-23 SURGERY — PHACOEMULSIFICATION, CATARACT, WITH IOL INSERTION
Anesthesia: Monitor Anesthesia Care | Site: Eye | Laterality: Right

## 2022-03-23 MED ORDER — MIDAZOLAM HCL 5 MG/5ML IJ SOLN
INTRAMUSCULAR | Status: DC | PRN
  Administered 2022-03-23: 1 mg via INTRAVENOUS

## 2022-03-23 MED ORDER — BSS IO SOLN
INTRAOCULAR | Status: DC | PRN
  Administered 2022-03-23: 15 mL via INTRAOCULAR

## 2022-03-23 MED ORDER — MOXIFLOXACIN HCL 5 MG/ML IO SOLN
INTRAOCULAR | Status: AC
  Filled 2022-03-23: qty 1

## 2022-03-23 MED ORDER — LIDOCAINE HCL (PF) 1 % IJ SOLN
INTRAOCULAR | Status: DC | PRN
  Administered 2022-03-23: 1 mL via OPHTHALMIC

## 2022-03-23 MED ORDER — SODIUM HYALURONATE 10 MG/ML IO SOLUTION
PREFILLED_SYRINGE | INTRAOCULAR | Status: DC | PRN
  Administered 2022-03-23: 0.85 mL via INTRAOCULAR

## 2022-03-23 MED ORDER — EPINEPHRINE PF 1 MG/ML IJ SOLN
INTRAMUSCULAR | Status: AC
  Filled 2022-03-23: qty 2

## 2022-03-23 MED ORDER — MOXIFLOXACIN HCL 0.5 % OP SOLN: OPHTHALMIC | Status: DC | PRN

## 2022-03-23 MED ORDER — TROPICAMIDE 1 % OP SOLN
1.0000 [drp] | OPHTHALMIC | Status: AC | PRN
  Administered 2022-03-23 (×3): 1 [drp] via OPHTHALMIC

## 2022-03-23 MED ORDER — TETRACAINE HCL 0.5 % OP SOLN
1.0000 [drp] | OPHTHALMIC | Status: AC | PRN
  Administered 2022-03-23 (×3): 1 [drp] via OPHTHALMIC

## 2022-03-23 MED ORDER — PHENYLEPHRINE-KETOROLAC 1-0.3 % IO SOLN
INTRAOCULAR | Status: AC
  Filled 2022-03-23: qty 4

## 2022-03-23 MED ORDER — POVIDONE-IODINE 5 % OP SOLN
OPHTHALMIC | Status: DC | PRN
  Administered 2022-03-23: 1 via OPHTHALMIC

## 2022-03-23 MED ORDER — PHENYLEPHRINE HCL 2.5 % OP SOLN
1.0000 [drp] | OPHTHALMIC | Status: AC | PRN
  Administered 2022-03-23 (×3): 1 [drp] via OPHTHALMIC

## 2022-03-23 MED ORDER — MOXIFLOXACIN HCL 5 MG/ML IO SOLN
INTRAOCULAR | Status: DC | PRN
  Administered 2022-03-23: .3 mL via INTRACAMERAL

## 2022-03-23 MED ORDER — MIDAZOLAM HCL 2 MG/2ML IJ SOLN
INTRAMUSCULAR | Status: AC
  Filled 2022-03-23: qty 2

## 2022-03-23 MED ORDER — SODIUM HYALURONATE 23MG/ML IO SOSY
PREFILLED_SYRINGE | INTRAOCULAR | Status: DC | PRN
  Administered 2022-03-23: 0.6 mL via INTRAOCULAR

## 2022-03-23 MED ORDER — STERILE WATER FOR IRRIGATION IR SOLN
Status: DC | PRN
  Administered 2022-03-23: 50 mL

## 2022-03-23 MED ORDER — PHENYLEPHRINE-KETOROLAC 1-0.3 % IO SOLN
INTRAOCULAR | Status: DC | PRN
  Administered 2022-03-23: 500 mL via OPHTHALMIC

## 2022-03-23 MED ORDER — LIDOCAINE HCL 3.5 % OP GEL: 1.0000 | Freq: Once | OPHTHALMIC | Status: DC

## 2022-03-23 MED ORDER — SODIUM CHLORIDE 0.9% FLUSH
INTRAVENOUS | Status: DC | PRN
  Administered 2022-03-23: 5 mL via INTRAVENOUS

## 2022-03-23 SURGICAL SUPPLY — 15 items
CATARACT SUITE SIGHTPATH (MISCELLANEOUS) ×1 IMPLANT
CLOTH BEACON ORANGE TIMEOUT ST (SAFETY) ×1 IMPLANT
EYE SHIELD UNIVERSAL CLEAR (GAUZE/BANDAGES/DRESSINGS) IMPLANT
FEE CATARACT SUITE SIGHTPATH (MISCELLANEOUS) ×1 IMPLANT
GLOVE BIOGEL PI IND STRL 7.0 (GLOVE) ×2 IMPLANT
GLOVE SKINSENSE STRL SZ6.5 (GLOVE) IMPLANT
LENS IOL RAYNER 21.0 (Intraocular Lens) ×1 IMPLANT
LENS IOL RAYONE EMV 21.0 (Intraocular Lens) IMPLANT
NDL HYPO 18GX1.5 BLUNT FILL (NEEDLE) ×1 IMPLANT
NEEDLE HYPO 18GX1.5 BLUNT FILL (NEEDLE) ×1 IMPLANT
PAD ARMBOARD 7.5X6 YLW CONV (MISCELLANEOUS) ×1 IMPLANT
RING MALYGIN 7.0 (MISCELLANEOUS) IMPLANT
SYR TB 1ML LL NO SAFETY (SYRINGE) ×1 IMPLANT
TAPE SURG TRANSPORE 1 IN (GAUZE/BANDAGES/DRESSINGS) IMPLANT
TAPE SURGICAL TRANSPORE 1 IN (GAUZE/BANDAGES/DRESSINGS) ×1

## 2022-03-23 NOTE — Transfer of Care (Addendum)
Immediate Anesthesia Transfer of Care Note  Patient: Sandy Valencia  Procedure(s) Performed: CATARACT EXTRACTION PHACO AND INTRAOCULAR LENS PLACEMENT (IOC) (Right: Eye)  Patient Location: Short Stay  Anesthesia Type:MAC  Level of Consciousness: awake  Airway & Oxygen Therapy: Patient Spontanous Breathing  Post-op Assessment: Report given to RN  Post vital signs: Reviewed and stable  Last Vitals:  Vitals Value Taken Time  BP 115/65   Temp 36.6   Pulse 72   Resp 14   SpO2 100     Last Pain:  Vitals:   03/23/22 0834  TempSrc: Oral  PainSc: 0-No pain      Patients Stated Pain Goal: 5 (93/57/01 [id number])  Complications: No notable events documented.

## 2022-03-23 NOTE — Addendum Note (Signed)
Addendum  created 03/23/22 0947 by Ollen Bowl, CRNA   Clinical Note Signed

## 2022-03-23 NOTE — Anesthesia Postprocedure Evaluation (Signed)
Anesthesia Post Note  Patient: Mariadelaluz W Velador  Procedure(s) Performed: CATARACT EXTRACTION PHACO AND INTRAOCULAR LENS PLACEMENT (IOC) (Right: Eye)  Patient location during evaluation: Short Stay Anesthesia Type: MAC Level of consciousness: awake and alert Pain management: pain level controlled Vital Signs Assessment: post-procedure vital signs reviewed and stable Respiratory status: spontaneous breathing Cardiovascular status: blood pressure returned to baseline and stable Postop Assessment: no apparent nausea or vomiting Anesthetic complications: no   No notable events documented.   Last Vitals:  Vitals:   03/23/22 0834  BP: 110/70  Pulse: 65  Resp: 14  Temp: 36.6 C  SpO2: 99%    Last Pain:  Vitals:   03/23/22 0834  TempSrc: Oral  PainSc: 0-No pain                 Sage Hammill

## 2022-03-23 NOTE — Anesthesia Preprocedure Evaluation (Signed)
Anesthesia Evaluation  Patient identified by MRN, date of birth, ID band Patient awake    Reviewed: Allergy & Precautions, NPO status , Patient's Chart, lab work & pertinent test results  Airway Mallampati: III  TM Distance: >3 FB Neck ROM: Full    Dental  (+) Dental Advisory Given, Missing   Pulmonary asthma , sleep apnea , COPD,    breath sounds clear to auscultation       Cardiovascular Exercise Tolerance: Good hypertension, Pt. on medications Normal cardiovascular exam Rhythm:Regular Rate:Normal     Neuro/Psych  Neuromuscular disease negative psych ROS   GI/Hepatic Neg liver ROS, hiatal hernia, GERD  Medicated and Controlled,  Endo/Other  diabetes, Well Controlled, Type 2, Oral Hypoglycemic Agents, Insulin DependentHypothyroidism Morbid obesity  Renal/GU negative Renal ROS  negative genitourinary   Musculoskeletal  (+) Arthritis , Osteoarthritis,    Abdominal   Peds negative pediatric ROS (+)  Hematology  (+) Blood dyscrasia, anemia ,   Anesthesia Other Findings   Reproductive/Obstetrics negative OB ROS                            Anesthesia Physical Anesthesia Plan  ASA: 3  Anesthesia Plan: MAC   Post-op Pain Management: Minimal or no pain anticipated   Induction: Intravenous  PONV Risk Score and Plan:   Airway Management Planned: Natural Airway and Nasal Cannula  Additional Equipment:   Intra-op Plan:   Post-operative Plan:   Informed Consent: I have reviewed the patients History and Physical, chart, labs and discussed the procedure including the risks, benefits and alternatives for the proposed anesthesia with the patient or authorized representative who has indicated his/her understanding and acceptance.     Dental advisory given  Plan Discussed with: CRNA and Surgeon  Anesthesia Plan Comments:         Anesthesia Quick Evaluation  

## 2022-03-23 NOTE — Op Note (Signed)
Date of procedure: 03/23/22  Pre-operative diagnosis: Visually significant age-related combined cataract, Right Eye; Poor Dilation, Right Eye (H25.811)  Post-operative diagnosis: Visually significant age-related cataract, Right Eye; Intra-operative Floppy Iris Syndrome, Right Eye (H21.81)  Procedure: Removal of cataract via phacoemulsification and insertion of intra-ocular lens Rayner [id number] +21.0D into the capsular bag of the Right Eye (CPT [phone])  Attending surgeon: Gerda Diss. Amaad Byers, MD, MA  Anesthesia: MAC, Topical Akten  Complications: None  Estimated Blood Loss: <56m (minimal)  Specimens: None  Implants: As above  Indications:  Visually significant cataract, Right Eye  Procedure:  The patient was seen and identified in the pre-operative area. The operative eye was identified and dilated.  The operative eye was marked.  Topical anesthesia was administered to the operative eye.     The patient was then to the operative suite and placed in the supine position.  A timeout was performed confirming the patient, procedure to be performed, and all other relevant information.   The patient's face was prepped and draped in the usual fashion for intra-ocular surgery.  A lid speculum was placed into the operative eye and the surgical microscope moved into place and focused.  Poor dilation of the iris was confirmed.  A superotemporal paracentesis was created using a 20 gauge paracentesis blade.  Shugarcaine was injected into the anterior chamber.  Viscoelastic was injected into the anterior chamber.  A temporal clear-corneal main wound incision was created using a 2.454mmicrokeratome.  A Malyugin ring was placed.  A continuous curvilinear capsulorrhexis was initiated using an irrigating cystitome and completed using capsulorrhexis forceps.  Hydrodissection and hydrodeliniation were performed.  Viscoelastic was injected into the anterior chamber.  A phacoemulsification handpiece and a chopper as a  second instrument were used to remove the nucleus and epinucleus. The irrigation/aspiration handpiece was used to remove any remaining cortical material.   The capsular bag was reinflated with viscoelastic, checked, and found to be intact.  The intraocular lens was inserted into the capsular bag and dialed into place using a MaSurveyor, minerals The Malyugin ring was removed.  The irrigation/aspiration handpiece was used to remove any remaining viscoelastic.  The clear corneal wound and paracentesis wounds were then hydrated and checked with Weck-Cels to be watertight. 0.37m66mf moxifloxacin was injected into the anterior chamber.  The lid-speculum and drape was removed, and the patient's face was cleaned with a wet and dry 4x4. A clear shield was taped over the eye. The patient was taken to the post-operative care unit in good condition, having tolerated the procedure well.  Post-Op Instructions: [facility] evaluation and will receive all other orders and instructions.

## 2022-03-23 NOTE — Discharge Instructions (Signed)
Please discharge patient when stable, will follow up today with Dr. Caidence Kaseman at the [facility] office immediately following discharge.  Leave shield in place until visit.  All paperwork with discharge instructions will be given at the office.  [facility] Address:  [address]  , [place] [address]  

## 2022-03-23 NOTE — Interval H&P Note (Signed)
History and Physical Interval Note:  03/23/2022 9:01 AM  Sandy Valencia  has presented today for surgery, with the diagnosis of combined forms age related cataract; right.  The various methods of treatment have been discussed with the patient and family. After consideration of risks, benefits and other options for treatment, the patient has consented to  Procedure(s) with comments: CATARACT EXTRACTION PHACO AND INTRAOCULAR LENS PLACEMENT (IOC) (Right) - CDE:  as a surgical intervention.  The patient's history has been reviewed, patient examined, no change in status, stable for surgery.  I have reviewed the patient's chart and labs.  Questions were answered to the patient's satisfaction.     Baruch Goldmann

## 2022-03-27 ENCOUNTER — Encounter (HOSPITAL_COMMUNITY): Payer: Self-pay | Admitting: Ophthalmology

## 2022-03-28 DIAGNOSIS — M5416 Radiculopathy, lumbar region: Secondary | ICD-10-CM | POA: Diagnosis not present

## 2022-04-11 ENCOUNTER — Other Ambulatory Visit: Payer: Self-pay | Admitting: Internal Medicine

## 2022-04-11 DIAGNOSIS — L600 Ingrowing nail: Secondary | ICD-10-CM | POA: Diagnosis not present

## 2022-04-11 DIAGNOSIS — B351 Tinea unguium: Secondary | ICD-10-CM | POA: Diagnosis not present

## 2022-04-11 DIAGNOSIS — L03031 Cellulitis of right toe: Secondary | ICD-10-CM | POA: Diagnosis not present

## 2022-04-11 DIAGNOSIS — L03032 Cellulitis of left toe: Secondary | ICD-10-CM | POA: Diagnosis not present

## 2022-04-16 ENCOUNTER — Encounter (HOSPITAL_COMMUNITY): Payer: Self-pay

## 2022-04-16 ENCOUNTER — Emergency Department (HOSPITAL_COMMUNITY)
Admission: EM | Admit: 2022-04-16 | Discharge: 2022-04-16 | Disposition: A | Discharge status: Home / Self Care | Payer: Medicaid Other | Attending: Emergency Medicine | Admitting: Emergency Medicine

## 2022-04-16 ENCOUNTER — Other Ambulatory Visit: Payer: Self-pay

## 2022-04-16 DIAGNOSIS — Z7984 Long term (current) use of oral hypoglycemic drugs: Secondary | ICD-10-CM | POA: Insufficient documentation

## 2022-04-16 DIAGNOSIS — Z7982 Long term (current) use of aspirin: Secondary | ICD-10-CM | POA: Diagnosis not present

## 2022-04-16 DIAGNOSIS — E860 Dehydration: Secondary | ICD-10-CM | POA: Diagnosis not present

## 2022-04-16 DIAGNOSIS — Z794 Long term (current) use of insulin: Secondary | ICD-10-CM | POA: Diagnosis not present

## 2022-04-16 DIAGNOSIS — E119 Type 2 diabetes mellitus without complications: Secondary | ICD-10-CM

## 2022-04-16 DIAGNOSIS — R112 Nausea with vomiting, unspecified: Secondary | ICD-10-CM | POA: Diagnosis not present

## 2022-04-16 DIAGNOSIS — R197 Diarrhea, unspecified: Secondary | ICD-10-CM | POA: Diagnosis not present

## 2022-04-16 DIAGNOSIS — E1165 Type 2 diabetes mellitus with hyperglycemia: Secondary | ICD-10-CM | POA: Insufficient documentation

## 2022-04-16 DIAGNOSIS — R739 Hyperglycemia, unspecified: Secondary | ICD-10-CM

## 2022-04-16 LAB — COMPREHENSIVE METABOLIC PANEL
ALT: 63 U/L — ABNORMAL HIGH (ref 0–44)
AST: 45 U/L — ABNORMAL HIGH (ref 15–41)
Albumin: 4.2 g/dL (ref 3.5–5.0)
Alkaline Phosphatase: 62 U/L (ref 38–126)
Anion gap: 10 (ref 5–15)
BUN: 14 mg/dL (ref 6–20)
CO2: 23 mmol/L (ref 22–32)
Calcium: 8.9 mg/dL (ref 8.9–10.3)
Chloride: 104 mmol/L (ref 98–111)
Creatinine, Ser: 0.69 mg/dL (ref 0.44–1.00)
GFR, Estimated: >60 mL/min (ref >60)
Glucose, Bld: 259 mg/dL — ABNORMAL HIGH (ref 70–99)
Potassium: 3.6 mmol/L (ref 3.5–5.1)
Sodium: 137 mmol/L (ref 135–145)
Total Bilirubin: 1.1 mg/dL (ref 0.3–1.2)
Total Protein: 7.8 g/dL (ref 6.5–8.1)

## 2022-04-16 LAB — CBC
HCT: 44 % (ref 36.0–46.0)
Hemoglobin: 14.1 g/dL (ref 12.0–15.0)
MCH: 28.9 pg (ref 26.0–34.0)
MCHC: 32 g/dL (ref 30.0–36.0)
MCV: 90.2 fL (ref 80.0–100.0)
Platelets: 294 10*3/uL (ref 150–400)
RBC: 4.88 MIL/uL (ref 3.87–5.11)
RDW: 14.6 % (ref 11.5–15.5)
WBC: 9.5 10*3/uL (ref 4.0–10.5)
nRBC: 0 % (ref 0.0–0.2)

## 2022-04-16 LAB — URINALYSIS, ROUTINE W REFLEX MICROSCOPIC
Bacteria, UA: NONE SEEN
Bilirubin Urine: NEGATIVE
Glucose, UA: 150 mg/dL — AB
Hgb urine dipstick: NEGATIVE
Ketones, ur: 20 mg/dL — AB
Nitrite: NEGATIVE
Protein, ur: 30 mg/dL — AB
Specific Gravity, Urine: 1.024 (ref 1.005–1.030)
pH: 5 (ref 5.0–8.0)

## 2022-04-16 LAB — LIPASE, BLOOD: Lipase: 50 U/L (ref 11–51)

## 2022-04-16 MED ORDER — LACTATED RINGERS IV BOLUS
1000.0000 mL | Freq: Once | INTRAVENOUS | Status: AC
  Administered 2022-04-16: 1000 mL via INTRAVENOUS

## 2022-04-16 MED ORDER — ONDANSETRON HCL 4 MG/2ML IJ SOLN
4.0000 mg | Freq: Once | INTRAMUSCULAR | Status: AC
  Administered 2022-04-16: 4 mg via INTRAVENOUS
  Filled 2022-04-16: qty 2

## 2022-04-16 MED ORDER — SODIUM CHLORIDE 0.9 % IV BOLUS
1000.0000 mL | Freq: Once | INTRAVENOUS | Status: AC
  Administered 2022-04-16: 1000 mL via INTRAVENOUS

## 2022-04-16 NOTE — ED Provider Notes (Signed)
[facility] EMERGENCY DEPARTMENT Provider Note   CSN: [id number] Arrival date & time: 04/16/22  1716     History  Chief Complaint  Patient presents with   Emesis    Sandy Valencia is a 61 y.o. female.  Pt c/o nausea, vomiting and diarrhea. Symptoms acute onset yesterday afternoon shortly after eating [place] food. Emesis clear, not bloody or bilious. Diarrhea watery. No fever or chills. No focal or constant abd pain. No known ill contacts. No recent antibiotic use. No dysuria or gu c/o.   The history is provided by the patient, medical records and a relative.  Emesis Associated symptoms: diarrhea   Associated symptoms: no abdominal pain, no chills, no fever, no headaches and no sore throat        Home Medications Prior to Admission medications   Medication Sig Start Date End Date Taking? Authorizing Provider  Accu-Chek FastClix Lancets MISC USE TO TEST 4 TIMES DAILY. 03/12/22   Lindell Spar, MD  ACCU-CHEK GUIDE test strip USE TO TEST 4 TIMES DAILY. 04/11/22   Lindell Spar, MD  aspirin EC 81 MG tablet Take 81 mg by mouth daily.    [provider]  cetirizine (ZYRTEC) 10 MG tablet TAKE ONE TABLET ('10MG'$  TOTAL) BY MOUTH DAILY 01/18/22   Valentina Shaggy, MD  Cholecalciferol (VITAMIN D-3) 1000 UNITS CAPS Take 1,000 Units by mouth daily.     [provider]  Continuous Blood Gluc Sensor (DEXCOM G6 SENSOR) MISC Change sensor every 10 days as directed 04/25/21   Brita Romp, NP  Continuous Blood Gluc Transmit (DEXCOM G6 TRANSMITTER) MISC CHANGE TRANSMITTER EVERY 90 DAYS AS DIRECTED. 03/21/22   Brita Romp, NP  diclofenac Sodium (VOLTAREN) 1 % GEL APPLY 2 GRAMS TO AFFECTED AREAS 2 TIMES A DAY AS NEEDED 11/15/21   Lindell Spar, MD  EPINEPHrine 0.3 mg/0.3 mL IJ SOAJ injection Inject 0.3 mg into the muscle as needed for anaphylaxis. 04/12/21   Valentina Shaggy, MD  fluticasone [facility]) 50 MCG/ACT nasal spray Place 1 spray into both nostrils  daily. 10/12/20   Valentina Shaggy, MD  HYDROcodone-acetaminophen [facility]) 7.5-325 MG tablet Take by mouth. 06/28/20   [provider]  insulin aspart (NOVOLOG FLEXPEN) 100 UNIT/ML FlexPen Inject 20-26 Units into the skin 3 (three) times daily with meals. 02/20/22   Brita Romp, NP  ketoconazole (NIZORAL) 2 % cream APPLY TO AFFECTED AREAS TWICE DAILY 02/19/22   Lindell Spar, MD  LANTUS SOLOSTAR 100 UNIT/ML Solostar Pen Inject 60 Units into the skin at bedtime. 02/20/22   Brita Romp, NP  levothyroxine (SYNTHROID) 75 MCG tablet TAKE ONE (1) TABLET BY MOUTH EVERY DAY BEFORE BREAKFAST 01/18/22   Brita Romp, NP  lisinopril (ZESTRIL) 20 MG tablet TAKE ONE TABLET ('20MG'$  TOTAL) BY MOUTH DAILY 02/19/22   Lindell Spar, MD  LITETOUCH PEN NEEDLES 31G X 8 MM MISC USE AS DIRECTED FOUR TIMES DAILY. 04/11/22   Lindell Spar, MD  meclizine (ANTIVERT) 25 MG tablet Take 1 tablet (25 mg total) by mouth 2 (two) times daily as needed for dizziness. 03/15/22   Lindell Spar, MD  metFORMIN (GLUCOPHAGE-XR) 500 MG 24 hr tablet TAKE ONE TABLET BY MOUTH TWICE A DAY AFTER A MEAL 01/18/22   Brita Romp, NP  pantoprazole (PROTONIX) 40 MG tablet TAKE ONE TABLET ('40MG'$  TOTAL) BY MOUTH DAILY 11/20/21   Valentina Shaggy, MD  pregabalin (LYRICA) 150 MG capsule Take 150 mg by  mouth 2 (two) times daily.    [provider]  PROAIR HFA 108 [phone] Base) MCG/ACT inhaler INHALE 4 PUFFS INTO THE LUNGS EVERY 6 HOURS AS NEEDED FOR WHEEZING OR SHORTNESS OF BREATH 11/20/21   Valentina Shaggy, MD  Semaglutide, 1 MG/DOSE, 4 MG/3ML SOPN Inject 1 mg as directed once a week. 11/14/21   Brita Romp, NP  triamcinolone cream (KENALOG) 0.1 % Apply 1 application topically 2 (two) times daily. 11/18/20   Lindell Spar, MD  vitamin B-12 (CYANOCOBALAMIN) 500 MCG tablet Take 500 mcg by mouth daily.    [provider]      Allergies    Kiwi extract and Other    Review of Systems    Review of Systems  Constitutional:  Negative for chills and fever.  HENT:  Negative for sore throat.   Eyes:  Negative for redness.  Respiratory:  Negative for shortness of breath.   Cardiovascular:  Negative for chest pain.  Gastrointestinal:  Positive for diarrhea, nausea and vomiting. Negative for abdominal pain.  Genitourinary:  Negative for dysuria and flank pain.  Musculoskeletal:  Negative for back pain.  Skin:  Negative for rash.  Neurological:  Negative for headaches.  Hematological:  Does not bruise/bleed easily.  Psychiatric/Behavioral:  Negative for confusion.     Physical Exam Updated Vital Signs BP (!) 147/85   Pulse 100   Temp 99.5 F (37.5 C) (Oral)   Resp 18   Ht 1.651 m ('5\' 5"'$ )   Wt 99.8 kg   SpO2 97%   BMI 36.61 kg/m  Physical Exam Vitals and nursing note reviewed.  Constitutional:      Appearance: Normal appearance. She is well-developed.  HENT:     Head: Atraumatic.     Nose: Nose normal.     Mouth/Throat:     Mouth: Mucous membranes are moist.     Pharynx: Oropharynx is clear.  Eyes:     General: No scleral icterus.    Conjunctiva/sclera: Conjunctivae normal.  Neck:     Trachea: No tracheal deviation.  Cardiovascular:     Rate and Rhythm: Normal rate and regular rhythm.     Pulses: Normal pulses.     Heart sounds: Normal heart sounds. No murmur heard.    No friction rub. No gallop.  Pulmonary:     Effort: Pulmonary effort is normal. No respiratory distress.     Breath sounds: Normal breath sounds.  Abdominal:     General: Bowel sounds are normal. There is no distension.     Palpations: Abdomen is soft. There is no mass.     Tenderness: There is no abdominal tenderness. There is no guarding.  Genitourinary:    Comments: No cva tenderness.  Musculoskeletal:        General: No swelling.     Cervical back: Normal range of motion and neck supple. No rigidity. No muscular tenderness.  Skin:    General: Skin is warm and dry.     Findings:  No rash.  Neurological:     Mental Status: She is alert.     Comments: Alert, speech normal.   Psychiatric:        Mood and Affect: Mood normal.     ED Results / Procedures / Treatments   Labs (all labs ordered are listed, but only abnormal results are displayed) Results for orders placed or performed during the hospital encounter of 04/16/22  Lipase, blood  Result Value Ref Range   Lipase 50  11 - 51 U/L  Comprehensive metabolic panel  Result Value Ref Range   Sodium 137 135 - 145 mmol/L   Potassium 3.6 3.5 - 5.1 mmol/L   Chloride 104 98 - 111 mmol/L   CO2 23 22 - 32 mmol/L   Glucose, Bld 259 (H) 70 - 99 mg/dL   BUN 14 6 - 20 mg/dL   Creatinine, Ser 0.69 0.44 - 1.00 mg/dL   Calcium 8.9 8.9 - 10.3 mg/dL   Total Protein 7.8 6.5 - 8.1 g/dL   Albumin 4.2 3.5 - 5.0 g/dL   AST 45 (H) 15 - 41 U/L   ALT 63 (H) 0 - 44 U/L   Alkaline Phosphatase 62 38 - 126 U/L   Total Bilirubin 1.1 0.3 - 1.2 mg/dL   GFR, Estimated >60 >60 mL/min   Anion gap 10 5 - 15  CBC  Result Value Ref Range   WBC 9.5 4.0 - 10.5 K/uL   RBC 4.88 3.87 - 5.11 MIL/uL   Hemoglobin 14.1 12.0 - 15.0 g/dL   HCT 44.0 36.0 - 46.0 %   MCV 90.2 80.0 - 100.0 fL   MCH 28.9 26.0 - 34.0 pg   MCHC 32.0 30.0 - 36.0 g/dL   RDW 14.6 11.5 - 15.5 %   Platelets 294 150 - 400 K/uL   nRBC 0.0 0.0 - 0.2 %  Urinalysis, Routine w reflex microscopic Urine, Clean Catch  Result Value Ref Range   Color, Urine YELLOW YELLOW   APPearance CLEAR CLEAR   Specific Gravity, Urine 1.024 1.005 - 1.030   pH 5.0 5.0 - 8.0   Glucose, UA 150 (A) NEGATIVE mg/dL   Hgb urine dipstick NEGATIVE NEGATIVE   Bilirubin Urine NEGATIVE NEGATIVE   Ketones, ur 20 (A) NEGATIVE mg/dL   Protein, ur 30 (A) NEGATIVE mg/dL   Nitrite NEGATIVE NEGATIVE   Leukocytes,Ua SMALL (A) NEGATIVE   RBC / HPF 0-5 0 - 5 RBC/hpf   WBC, UA 11-20 0 - 5 WBC/hpf   Bacteria, UA NONE SEEN NONE SEEN   Squamous Epithelial / LPF 0-5 0 - 5   Mucus PRESENT       EKG None  Radiology No results found.  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 Labs: ordered. Decision-making details documented in ED Course.  Risk Prescription drug management. Decision regarding hospitalization.   Iv ns. Continuous pulse ox and cardiac monitoring. Labs ordered/sent. Imaging ordered.   Diff dx includes gastroenteritis, viral illness, food poisoning, other nvd illness, etc - dispo decision including potential need for admission considered  if severe dehydration, aki, etc - will get labs, give ivf, and reassess.   Reviewed nursing notes and prior charts for additional history. External reports reviewed. Additional history from: family.   Cardiac monitor: sinus rhythm, rate 94.  Labs reviewed/interpreted by me - wbc normal. K normal.  Pt denies dysuria or gu c/o, no abd or cva tenderness. Glucose elev, hco3 normal. Ivf.   Iv ns bolus. Zofran iv.   Trial of po fluids.   LR bolus.  Pt tolerating po. Abd soft non tender. No recurrent emesis.   Pt appears stable for  d/c.  Return precautions provided.            Final Clinical Impression(s) / ED Diagnoses Final diagnoses:  None    Rx / DC Orders ED Discharge Orders     None         Lajean Saver, MD 04/16/22 2112

## 2022-04-16 NOTE — Discharge Instructions (Addendum)
It was our pleasure to provide your ER care today - we hope that you feel better.  For recent nausea/vomiting/diarrhea and high blood sugars - make sure to drink plenty of fluids/water, and stay well hydrated.   Follow up closely with primary care doctor in the next 1-2 days if symptoms fail to improve/resolve.  Return to ER right  if worse, new symptoms, fevers, new, worsening or severe abdominal pain, persistent vomiting, severe diarrhea, or other concern.

## 2022-04-16 NOTE — ED Triage Notes (Signed)
Pt presents to ED with complaints of vomiting started last night. Pt states she went to buffet yesterday and has felt sick since.

## 2022-04-16 NOTE — ED Notes (Signed)
Pt drank sprite with no problems.

## 2022-04-16 NOTE — ED Notes (Signed)
Pt given diet sprite to sip on.

## 2022-04-20 ENCOUNTER — Other Ambulatory Visit: Payer: Self-pay | Admitting: Nurse Practitioner

## 2022-04-20 ENCOUNTER — Other Ambulatory Visit: Payer: Self-pay | Admitting: Allergy & Immunology

## 2022-04-20 DIAGNOSIS — Z794 Long term (current) use of insulin: Secondary | ICD-10-CM

## 2022-04-24 ENCOUNTER — Other Ambulatory Visit: Payer: Self-pay | Admitting: Allergy & Immunology

## 2022-04-24 DIAGNOSIS — G8929 Other chronic pain: Secondary | ICD-10-CM | POA: Diagnosis not present

## 2022-04-24 DIAGNOSIS — M545 Low back pain, unspecified: Secondary | ICD-10-CM | POA: Diagnosis not present

## 2022-04-24 DIAGNOSIS — M542 Cervicalgia: Secondary | ICD-10-CM | POA: Diagnosis not present

## 2022-04-24 DIAGNOSIS — M79673 Pain in unspecified foot: Secondary | ICD-10-CM | POA: Diagnosis not present

## 2022-04-24 DIAGNOSIS — Z79891 Long term (current) use of opiate analgesic: Secondary | ICD-10-CM | POA: Diagnosis not present

## 2022-04-24 NOTE — Telephone Encounter (Signed)
Called and spoke to patient and informed her that the refill could be sent and that she was due for a yearly visit. Patient expressed that she wanted to schedule and we did so.

## 2022-04-25 ENCOUNTER — Other Ambulatory Visit: Payer: Self-pay

## 2022-04-25 ENCOUNTER — Ambulatory Visit (INDEPENDENT_AMBULATORY_CARE_PROVIDER_SITE_OTHER): Payer: Medicaid Other | Admitting: Allergy & Immunology

## 2022-04-25 ENCOUNTER — Encounter: Payer: Self-pay | Admitting: Allergy & Immunology

## 2022-04-25 VITALS — BP 146/82 | HR 80 | Temp 98.2°F | Resp 17 | Ht 66.0 in | Wt 229.6 lb

## 2022-04-25 DIAGNOSIS — J4520 Mild intermittent asthma, uncomplicated: Secondary | ICD-10-CM | POA: Diagnosis not present

## 2022-04-25 DIAGNOSIS — J310 Chronic rhinitis: Secondary | ICD-10-CM | POA: Diagnosis not present

## 2022-04-25 DIAGNOSIS — T7800XD Anaphylactic reaction due to unspecified food, subsequent encounter: Secondary | ICD-10-CM

## 2022-04-25 DIAGNOSIS — K219 Gastro-esophageal reflux disease without esophagitis: Secondary | ICD-10-CM

## 2022-04-25 MED ORDER — ALBUTEROL SULFATE HFA 108 (90 BASE) MCG/ACT IN AERS: 2.0000 | INHALATION_SPRAY | Freq: Four times a day (QID) | RESPIRATORY_TRACT | 2 refills | Status: DC | PRN

## 2022-04-25 MED ORDER — PANTOPRAZOLE SODIUM 40 MG PO TBEC: 40.0000 mg | DELAYED_RELEASE_TABLET | Freq: Every day | ORAL | 11 refills | Status: DC

## 2022-04-25 MED ORDER — EPINEPHRINE 0.3 MG/0.3ML IJ SOAJ: 0.3000 mg | INTRAMUSCULAR | 1 refills | Status: DC | PRN

## 2022-04-25 NOTE — Patient Instructions (Addendum)
1. Mild intermittent asthma, uncomplicated - Lung testing looked a little lower today. - I do not think that we need to start a daily medication, but we are going to see you in closer follow up.  - We are not going to make any changes at this time.  - Continue with albuterol 4 puffs every 4-6 hours as needed.   - There is no need for a controller medication at this time.   2. Gastroesophageal reflux disease - Continue with pantoprazole '40mg'$  daily.  3. 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 - EpiPen refilled today.    4. Chronic rhinitis - Continue fluticasone one spray per nostril daily as needed.  - You can also use cetirizine '10mg'$  daily as needed.   5. Return in about 3 months (around 07/26/2022).   Please inform us of any Emergency Department visits, hospitalizations, or changes in symptoms. Call us before going to the ED for breathing or allergy symptoms since we might be able to fit you in for a sick visit. Feel free to contact us anytime with any questions, problems, or concerns.  It was a pleasure to see you again today!  Websites that have reliable patient information: 1. American Academy of Asthma, Allergy, and Immunology: www.aaaai.org 2. Food Allergy Research and Education (FARE): foodallergy.org 3. Mothers of Asthmatics: http://www.asthmacommunitynetwork.org 4. American College of Allergy, Asthma, and Immunology: www.acaai.org   COVID-19 Vaccine Information can be found at: ShippingScam.co.uk For questions related to vaccine distribution or appointments, please email [email or web address] or call [phone].     "Like" [place] on Facebook and Instagram for our latest updates!      Make sure you are registered to vote! If you have moved or changed any of your contact information, you will need to get this updated before voting!  In some cases, you MAY be able to register to vote online:  [email or web address]

## 2022-04-25 NOTE — Addendum Note (Signed)
Addended by: Valentina Shaggy on: 04/25/2022 11:27 AM   Modules accepted: Orders

## 2022-04-25 NOTE — Progress Notes (Signed)
FOLLOW UP  Date of Service/Encounter:  04/25/22   Assessment:   Mild intermittent asthma, uncomplicated - with worsening lung function today   Adverse food reaction (walnut, kiwi, seafood)    Gastroesophageal reflux disease - controlled with a pantoprazole   Chronic nonallergic rhinitis - with negative environmental IgE panel in 2019    Plan/Recommendations:   1. Mild intermittent asthma, uncomplicated - Lung testing looked a little lower today. - I do not think that we need to start a daily medication, but we are going to see you in closer follow up.  - We are not going to make any changes at this time.  - Continue with albuterol 4 puffs every 4-6 hours as needed.   - There is no need for a controller medication at this time.   2. Gastroesophageal reflux disease - Continue with pantoprazole '40mg'$  daily.  3. 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 - EpiPen refilled today.    4. Chronic rhinitis - Continue fluticasone one spray per nostril daily as needed.  - You can also use cetirizine '10mg'$  daily as needed.   5. Return in about 3 months (around 07/26/2022).    Subjective:   Sandy Valencia is a 61 y.o. female presenting today for follow up of  Chief Complaint  Patient presents with   Asthma    Sandy Valencia has a history of the following: Patient Active Problem List   Diagnosis Date Noted   Lumbar radiculopathy 03/08/2022   Statin intolerance 01/19/2022   Encounter for general adult medical examination with abnormal findings 01/16/2022   Lumbar spondylosis 09/05/2021   Allergic rhinitis due to allergen 04/17/2021   Seborrheic keratosis 04/17/2021   Primary osteoarthritis involving multiple joints 12/02/2019   Long-term current use of opiate analgesic 09/08/2019   Chronic low back pain 09/07/2019   Neck pain 09/07/2019   Mild intermittent asthma without complication 03/20/5101   Gastroesophageal reflux disease 05/21/2018    Hypothyroidism 04/22/2017   Mixed hyperlipidemia 12/31/2016   Type 2 diabetes mellitus with hyperglycemia ([place]) 05/18/2015   Essential hypertension 05/18/2015   Morbid obesity ([place]) 05/18/2015   Fatty liver 02/14/2015   Hiatal hernia    Dysphagia, pharyngoesophageal phase     History obtained from: chart review and patient.  Sandy Valencia is a 61 y.o. female presenting for a follow up visit.  She was last seen in November 2022.  At that time, her lung testing was stable.  We continued with albuterol 4 puffs every 4-6 hours.  Reflux is under good control with pantoprazole 40 mg daily.  She continued to avoid kiwi as well as albuterol.  For rhinitis, we started Flonase 1 spray per nostril daily and continue with cetirizine 10 mg daily.  Since last visit, she has done very well. She is now an empty nester and loving her life.   Asthma/Respiratory Symptom History: Her breathing has been under good control. She denies any symptoms. She is not coughing or wheezing at all. She will have some issues with viral URIs. When she gets bronchitis, she typically gets treated ith steroids and antibiotics.   Allergic Rhinitis Symptom History: She has the cetirizine that she is using routinely. She has the Flonase to use as needed.  She has not needed antibiotics at all for her symptoms. She has done quite well since we saw her last time. She denies hospitalizations aside from an ED visits for food poisoning.   Food Allergy Symptom History: She continues  to avoid walnuts and kiwi. She denies any accidental exposures at all.  EpiPen is likely not up to date. She would like a refill today.   GERD Symptom History: She remains on the Protonix once daily.  Symptoms are well controlled on this regimen.   She was in the hospital for 5-6 hours due to food poisoning. She was on Zofran. She has been on Ozempic and it has helped her to lose a lot of weight. She has lost around 40 pounds. She wants to come off of it eventually.    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 He is a Administrator, arts. She is loving be by herself. They are going to [place] for Christmas.   Her sister passed away around one year ago.   Otherwise, there have been no changes to her past medical history, surgical history, family history, or social history.    Review of Systems  Constitutional: Negative.  Negative for chills, fever, malaise/fatigue and weight loss.  HENT: Negative.  Negative for congestion, ear discharge, ear pain and sore throat.   Eyes:  Negative for pain, discharge and redness.  Respiratory:  Negative for cough, sputum production, shortness of breath, wheezing and stridor.   Cardiovascular: Negative.  Negative for chest pain and palpitations.  Gastrointestinal:  Negative for abdominal pain, constipation, diarrhea, heartburn, nausea and vomiting.  Skin: Negative.  Negative for itching and rash.  Neurological:  Negative for dizziness and headaches.  Endo/Heme/Allergies:  Positive for environmental allergies. Does not bruise/bleed easily.       Objective:   Blood pressure (!) 146/82, pulse 80, temperature 98.2 F (36.8 C), temperature source Temporal, resp. rate 17, height '5\' 6"'$  (1.676 m), weight 229 lb 9.6 oz (104.1 kg), SpO2 96 %. Body mass index is 37.06 kg/m.    Physical Exam Vitals reviewed.  Constitutional:      Appearance: She is well-developed.     Comments: Extremely talkative.  HENT:     Head: Normocephalic and atraumatic.     Right Ear: Tympanic membrane, ear canal and external ear normal.     Left Ear: Tympanic membrane, ear canal and external ear normal.     Nose: No nasal deformity, septal deviation, mucosal edema or rhinorrhea.     Right Turbinates: Enlarged, swollen and pale.     Left Turbinates: Enlarged, swollen and pale.     Right Sinus: No maxillary sinus tenderness or frontal sinus tenderness.     Left Sinus: No maxillary sinus tenderness or frontal sinus tenderness.     Mouth/Throat:      Lips: Pink.     Mouth: Mucous membranes are moist. Mucous membranes are not pale and not dry.     Pharynx: Uvula midline.     Comments: Cobblestoning present.  Eyes:     General: Lids are normal. Allergic shiner present.        Right eye: No discharge.        Left eye: No discharge.     Conjunctiva/sclera: Conjunctivae normal.     Right eye: Right conjunctiva is not injected. No chemosis.    Left eye: Left conjunctiva is not injected. No chemosis.    Pupils: Pupils are equal, round, and reactive to light.  Cardiovascular:     Rate and Rhythm: Normal rate and regular rhythm.     Heart sounds: Normal heart sounds.  Pulmonary:     Effort: Pulmonary effort is normal. No tachypnea, accessory muscle usage or respiratory distress.     Breath  sounds: Normal breath sounds. No wheezing, rhonchi or rales.     Comments: Moving air well in all lung fields. Chest:     Chest wall: No tenderness.  Lymphadenopathy:     Cervical: No cervical adenopathy.  Skin:    Coloration: Skin is not pale.     Findings: No abrasion, erythema, petechiae or rash. Rash is not papular, urticarial or vesicular.  Neurological:     Mental Status: She is alert.  Psychiatric:        Behavior: Behavior is cooperative.      Diagnostic studies:    Spirometry: results abnormal (FEV1: 1.46/80%, FVC: 1.97/95%, FEV1/FVC: 74%).    Spirometry consistent with mild obstructive disease. This is a bit lower than that obtained at the last visit. WE are going to recheck in 3 months.    Allergy Studies: none        Salvatore Marvel, MD  [facility] [place] [facility] [place]

## 2022-04-30 ENCOUNTER — Other Ambulatory Visit: Payer: Self-pay | Admitting: Nurse Practitioner

## 2022-05-10 ENCOUNTER — Other Ambulatory Visit: Payer: Self-pay | Admitting: Internal Medicine

## 2022-05-22 ENCOUNTER — Other Ambulatory Visit: Payer: Self-pay | Admitting: Allergy & Immunology

## 2022-06-12 ENCOUNTER — Other Ambulatory Visit: Payer: Self-pay | Admitting: Nurse Practitioner

## 2022-06-14 ENCOUNTER — Other Ambulatory Visit: Payer: Self-pay | Admitting: Internal Medicine

## 2022-06-20 NOTE — Patient Instructions (Signed)

## 2022-06-21 ENCOUNTER — Other Ambulatory Visit: Payer: Self-pay | Admitting: Internal Medicine

## 2022-06-21 ENCOUNTER — Other Ambulatory Visit: Payer: Self-pay | Admitting: Nurse Practitioner

## 2022-06-21 DIAGNOSIS — E1165 Type 2 diabetes mellitus with hyperglycemia: Secondary | ICD-10-CM

## 2022-06-21 DIAGNOSIS — I10 Essential (primary) hypertension: Secondary | ICD-10-CM

## 2022-06-22 ENCOUNTER — Ambulatory Visit: Payer: Medicaid Other | Admitting: Nurse Practitioner

## 2022-06-22 ENCOUNTER — Encounter: Payer: Self-pay | Admitting: Nurse Practitioner

## 2022-06-22 VITALS — BP 149/69 | HR 72 | Ht 66.0 in | Wt 223.0 lb

## 2022-06-22 DIAGNOSIS — I10 Essential (primary) hypertension: Secondary | ICD-10-CM

## 2022-06-22 DIAGNOSIS — E559 Vitamin D deficiency, unspecified: Secondary | ICD-10-CM | POA: Diagnosis not present

## 2022-06-22 DIAGNOSIS — E039 Hypothyroidism, unspecified: Secondary | ICD-10-CM | POA: Diagnosis not present

## 2022-06-22 DIAGNOSIS — Z794 Long term (current) use of insulin: Secondary | ICD-10-CM | POA: Diagnosis not present

## 2022-06-22 DIAGNOSIS — E1165 Type 2 diabetes mellitus with hyperglycemia: Secondary | ICD-10-CM

## 2022-06-22 DIAGNOSIS — E782 Mixed hyperlipidemia: Secondary | ICD-10-CM | POA: Diagnosis not present

## 2022-06-22 LAB — POCT GLYCOSYLATED HEMOGLOBIN (HGB A1C): Hemoglobin A1C: 8.6 % — AB (ref 4.0–5.6)

## 2022-06-22 NOTE — Progress Notes (Signed)
06/22/2022   Endocrinology follow-up note   Subjective:    Patient ID: Sandy Valencia, female    DOB: 1960-12-29, PCP Lindell Spar, MD   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   Anaphylactic shock, unspecified, subsequent encounter    Anemia, iron deficiency 02/14/2015   Angio-edema    Arthritis    Asthma    Asymptomatic varicose veins of right lower extremity    Chronic obstructive pulmonary disease with (acute) exacerbation (HCC)    Chronic rhinitis 05/21/2018   COVID-19    Diabetes mellitus    Essential (primary) hypertension    Family history of colon cancer 09/03/2012   Fatty liver    Gastro-esophageal reflux disease without esophagitis    GERD (gastroesophageal reflux disease)    History of colonic polyps 08/26/2017   Hypercholesteremia    Hypertension    Hypothyroidism    Low back pain    Mass of finger, right s/p surgical excision (mucoid cyst) 04/17/18 04/22/2018   Mucous cyst of digit of right hand    Obesity, unspecified    Other adverse food reactions, not elsewhere classified, initial encounter    Personal history of noncompliance with medical treatment, presenting hazards to health 05/18/2015   Rash and other nonspecific skin eruption    Sleep apnea    pt siad, "i had small amount but could not tolerate CPAP and they said it was ok since it was mild.   Sleep apnea, unspecified    Unspecified asthma, uncomplicated    Zoster without complications    Past Surgical History:  Procedure Laterality Date   ABDOMINAL HYSTERECTOMY     CATARACT EXTRACTION W/PHACO Left 03/09/2022   Procedure: CATARACT EXTRACTION PHACO AND INTRAOCULAR LENS PLACEMENT ([place]);  Surgeon: Baruch Goldmann, MD;  Location: [facility];  Service: Ophthalmology;  Laterality: Left;  CDE 7.89   CATARACT EXTRACTION W/PHACO Right 03/23/2022   Procedure: CATARACT EXTRACTION PHACO AND INTRAOCULAR LENS PLACEMENT  ([facility]);  Surgeon: Baruch Goldmann, MD;  Location: [facility];  Service: Ophthalmology;  Laterality: Right;  CDE: 2.63   CHOLECYSTECTOMY     COLONOSCOPY N/A 09/22/2012   RMR: colonic polyps -removed as described above. tubular adenoma, next TCS 09/2017   COLONOSCOPY WITH PROPOFOL N/A 09/23/2017   Procedure: COLONOSCOPY WITH PROPOFOL;  Surgeon: Daneil Dolin, MD;  Location[facility];  Service: Endoscopy;  Laterality: N/A;  7:30am   CYST EXCISION Left 07/19/2016   Procedure: CYST REMOVAL LEFT RING FINGER;  Surgeon: Carole Civil, MD;  Location: [facility];  Service: Orthopedics;  Laterality: Left;   CYST REMOVAL LEG Right    foot   ESOPHAGOGASTRODUODENOSCOPY N/A 10/21/2014   RMR: Small hiatal hernia; otherwise normal EGD status post passage of a Maloney dilator.    EXCISION MASS UPPER EXTREMETIES Right 04/17/2018   Procedure: EXCISION MASS UPPER EXTREMETIES right ring finger;  Surgeon: Carole Civil, MD;  Location: [facility];  Service: Orthopedics;  Laterality: Right;   MALONEY DILATION N/A 10/21/2014   Procedure: [name] DILATION;  Surgeon: Daneil Dolin, MD;  [facility];  Service: Endoscopy;  Laterality: N/A;   MIDDLE EAR SURGERY Right    patched hole in ear drum   POLYPECTOMY  09/23/2017   Procedure: POLYPECTOMY;  Surgeon: Daneil Dolin, MD;  [facility];  Service: Endoscopy;;  polyp hepatic flexure polyp cs, splenic flexure polyp cs, rectal polyp  cs   Social History   Socioeconomic History   Marital status: Divorced    Spouse name: Not on file   Number of children: 2   Years of education: Not on file   Highest education level: Not on file  Occupational History   Not on file  Tobacco Use   Smoking status: Never   Smokeless tobacco: Never  Vaping Use   Vaping Use: Never used  Substance and Sexual Activity   Alcohol use: No    Alcohol/week: 0.0 standard drinks of alcohol   Drug use: No   Sexual activity: Yes    Birth control/protection: Surgical   Other Topics Concern   Not on file  Social History Narrative   Lives with grandson (since born)      Enjoys: vacation, travel, concerts      Diet: eats all food groups    Caffeine: diet Pepsi daily    Water:  8 cups daily       Wears seat belt    Does use phone   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    Social Determinants of Health   Financial Resource Strain: Low Risk  (12/02/2019)   Overall Financial Resource Strain (CARDIA)    Difficulty of Paying Living Expenses: Not hard at all  Food Insecurity: No Food Insecurity (12/02/2019)   Hunger Vital Sign    Worried About Running Out of Food in the Last Year: Never true    [place] in the Last Year: Never true  Transportation Needs: No Transportation Needs (12/02/2019)   PRAPARE - Hydrologist (Medical): No    Lack of Transportation (Non-Medical): No  Physical Activity: Insufficiently Active (12/02/2019)   Exercise Vital Sign    Days of Exercise per Week: 2 days    Minutes of Exercise per Session: 20 min  Stress: Not on file  Social Connections: Moderately Isolated (12/02/2019)   Social Connection and Isolation Panel [NHANES]    Frequency of Communication with Friends and Family: More than three times a week    Frequency of Social Gatherings with Friends and Family: More than three times a week    Attends Religious Services: More than 4 times per year    Active Member of [employer] or Organizations: No    Attends Archivist Meetings: Never    Marital Status: Divorced   Outpatient Encounter Medications as of 06/22/2022  Medication Sig   Accu-Chek FastClix Lancets MISC USE TO TEST 4 TIMES DAILY.   Accu-Chek FastClix Lancets MISC USE TO TEST 4 TIMES DAILY.   ACCU-CHEK GUIDE test strip USE TO TEST 4 TIMES DAILY.   albuterol (VENTOLIN HFA) 108 (90 Base) MCG/ACT inhaler Inhale 2 puffs into the lungs every 6 (six) hours as needed for wheezing or shortness of breath.   aspirin EC 81 MG  tablet Take 81 mg by mouth daily.   cetirizine (ZYRTEC) 10 MG tablet Take 1 tablet (10 mg total) by mouth daily as needed for allergies.   Cholecalciferol (VITAMIN D-3) 1000 UNITS CAPS Take 1,000 Units by mouth daily.    Continuous Blood Gluc Sensor (DEXCOM G6 SENSOR) MISC CHANGE SENSOR EVERY 10 DAYS AS DIRECTED.   Continuous Blood Gluc Transmit (DEXCOM G6 TRANSMITTER) MISC CHANGE TRANSMITTER EVERY 90 DAYS AS DIRECTED.   diclofenac Sodium (VOLTAREN) 1 % GEL APPLY 2 GRAMS TO AFFECTED AREAS 2 TIMES A DAY AS NEEDED   EPINEPHrine 0.3 mg/0.3 mL IJ SOAJ injection  Inject 0.3 mg into the muscle as needed for anaphylaxis.   fluticasone (FLONASE) 50 MCG/ACT nasal spray Place 1 spray into both nostrils daily.   HYDROcodone-acetaminophen (NORCO) 7.5-325 MG tablet Take 1 tablet by mouth 2 times daily at 12 noon and 4 pm.   insulin aspart (NOVOLOG FLEXPEN) 100 UNIT/ML FlexPen Inject 20-26 Units into the skin 3 (three) times daily with meals.   ketoconazole (NIZORAL) 2 % cream APPLY TO AFFECTED AREAS TWICE DAILY (Patient taking differently: Apply 1 Application topically 2 (two) times daily.)   LANTUS SOLOSTAR 100 UNIT/ML Solostar Pen Inject 60 Units into the skin at bedtime.   levothyroxine (SYNTHROID) 75 MCG tablet TAKE ONE (1) TABLET BY MOUTH EVERY DAY BEFORE BREAKFAST   lisinopril (ZESTRIL) 20 MG tablet TAKE ONE TABLET ('20MG'$  TOTAL) BY MOUTH DAILY   LITETOUCH PEN NEEDLES 31G X 8 MM MISC USE AS DIRECTED FOUR TIMES DAILY.   metFORMIN (GLUCOPHAGE-XR) 500 MG 24 hr tablet TAKE ONE TABLET BY MOUTH TWICE A DAY AFTER A MEAL   OZEMPIC, 1 MG/DOSE, 4 MG/3ML SOPN INJECT '1MG'$  ONCE A WEEK AS DIRECTED   pantoprazole (PROTONIX) 40 MG tablet Take 1 tablet (40 mg total) by mouth daily.   pregabalin (LYRICA) 100 MG capsule Take 100 mg by mouth 2 (two) times daily.   vitamin B-12 (CYANOCOBALAMIN) 500 MCG tablet Take 500 mcg by mouth daily.   [DISCONTINUED] lisinopril (ZESTRIL) 20 MG tablet TAKE ONE TABLET ('20MG'$  TOTAL) BY MOUTH  DAILY (Patient taking differently: Take 20 mg by mouth daily.)   [DISCONTINUED] Semaglutide, 1 MG/DOSE, (OZEMPIC, 1 MG/DOSE,) 4 MG/3ML SOPN INJECT '1MG'$  ONCE A WEEK AS DIRECTED   No facility-administered encounter medications on file as of 06/22/2022.   ALLERGIES: Allergies  Allergen Reactions   Kiwi Extract Anaphylaxis, Swelling and Palpitations   Other Anaphylaxis    Walnuts (avoids all tree nuts)   VACCINATION STATUS: Immunization History  Administered Date(s) Administered   Influenza,inj,Quad PF,6+ Mos 04/15/2017, 03/30/2019   Influenza-Unspecified 04/17/2011, 03/24/2012, 05/12/2013, 02/23/2015, 04/04/2017, 04/08/2018   Moderna Sars-Covid-2 Vaccination 10/22/2019, 11/19/2019   Tdap 09/18/2021   Zoster Recombinat (Shingrix) 09/14/2021, 01/16/2022    Diabetes She presents for her follow-up diabetic visit. She has type 2 diabetes mellitus. Onset time: She was diagnosed at approximate age of 33 years. Her disease course has been worsening. Hypoglycemia symptoms include hunger and tremors. Pertinent negatives for hypoglycemia include no confusion, nervousness/anxiousness, pallor or seizures. Pertinent negatives for diabetes include no fatigue, no polydipsia, no polyphagia, no polyuria and no weight loss. Hypoglycemia complications include nocturnal hypoglycemia. Symptoms are stable. There are no diabetic complications. Risk factors for coronary artery disease include dyslipidemia, diabetes mellitus, hypertension, sedentary lifestyle, obesity, post-menopausal and stress. Current diabetic treatment includes intensive insulin program and oral agent (monotherapy) (and Ozempic). She is compliant with treatment most of the time. Her weight is fluctuating minimally. She is following a generally healthy diet. When asked about meal planning, she reported none. She has had a previous visit with a dietitian. She never participates in exercise. Her home blood glucose trend is decreasing steadily. Her overall  blood glucose range is >200 mg/dl. (She presents today with her CGM and meter showing slightly above target glycemic profile overall, however slowly improving in recent weeks.  Her POCT A1c today is 8.6%, increasing from last visit of 8.2%.  She admits to cheating on her diet over the holidays and was off her Ozempic several weeks due to surgical procedures.  Analysis of her CGM shows TIR 24%, TAR 75%,  TBR 1% with a GMI of 8.4%.) An ACE inhibitor/angiotensin II receptor blocker is being taken. She sees a podiatrist.Eye exam is current.  Hyperlipidemia This is a chronic problem. The current episode started more than 1 year ago. The problem is controlled. Recent lipid tests were reviewed and are normal. Exacerbating diseases include diabetes, hypothyroidism and obesity. There are no known factors aggravating her hyperlipidemia. Pertinent negatives include no myalgias. Current antihyperlipidemic treatment includes statins. The current treatment provides significant improvement of lipids. Compliance problems include medication cost, adherence to diet and adherence to exercise.  Risk factors for coronary artery disease include diabetes mellitus, dyslipidemia, hypertension, obesity, a sedentary lifestyle and post-menopausal.  Hypertension This is a chronic problem. The current episode started more than 1 year ago. The problem has been gradually improving since onset. The problem is controlled. Pertinent negatives include no palpitations. Agents associated with hypertension include thyroid hormones. Risk factors for coronary artery disease include diabetes mellitus, dyslipidemia, obesity, post-menopausal state, sedentary lifestyle and stress. Past treatments include ACE inhibitors. The current treatment provides mild improvement. Compliance problems include diet and exercise.  Identifiable causes of hypertension include sleep apnea and a thyroid problem.  Thyroid Problem Presents for follow-up visit. Symptoms include  tremors. Patient reports no anxiety, cold intolerance, constipation, depressed mood, diarrhea, dry skin, fatigue, hair loss, heat intolerance, palpitations, weight gain or weight loss. The symptoms have been stable. Her past medical history is significant for diabetes and hyperlipidemia.     Review of systems  Constitutional: + minimally fluctuating body weight,  current Body mass index is 35.99 kg/m. , no fatigue, no subjective hyperthermia, no subjective hypothermia Eyes: no blurry vision, no xerophthalmia ENT: no sore throat, no nodules palpated in throat, no dysphagia/odynophagia, no hoarseness Cardiovascular: no chest pain, no shortness of breath, no palpitations, no leg swelling Respiratory: no cough, no shortness of breath Gastrointestinal: no nausea/vomiting/diarrhea Musculoskeletal: no muscle/joint aches Skin: no rashes, no hyperemia Neurological: no tremors, no numbness, no tingling, no dizziness Psychiatric: no depression, no anxiety   Objective:    BP (!) 149/69 (BP Location: Left Arm, Patient Position: Sitting, Cuff Size: Large) Comment: Patient states that she has not taken her BP medication this morning  Pulse 72   Ht '5\' 6"'$  (1.676 m)   Wt 223 lb (101.2 kg)   BMI 35.99 kg/m   Wt Readings from Last 3 Encounters:  06/22/22 223 lb (101.2 kg)  04/25/22 229 lb 9.6 oz (104.1 kg)  04/16/22 220 lb (99.8 kg)    BP Readings from Last 3 Encounters:  06/22/22 (!) 149/69  04/25/22 (!) 146/82  04/16/22 (!) 109/50     Physical Exam- Limited  Constitutional:  Body mass index is 35.99 kg/m. , not in acute distress, normal state of mind Eyes:  EOMI, no exophthalmos Musculoskeletal: no gross deformities, strength intact in all four extremities, no gross restriction of joint movements Skin:  no rashes, no hyperemia Neurological: no tremor with outstretched hands    Lipid Panel     Component Value Date/Time   CHOL 160 01/16/2022 0931   TRIG 71 01/16/2022 0931   HDL 55  01/16/2022 0931   CHOLHDL 2.9 01/16/2022 0931   CHOLHDL 2.4 12/25/2019 0709   VLDL 10 03/07/2016 0705   LDLCALC 91 01/16/2022 0931   LDLCALC 65 12/25/2019 0709    Recent Results (from the past 2160 hour(s))  Lipase, blood     Status: None   Collection Time: 04/16/22  6:09 PM  Result Value Ref Range  Lipase 50 11 - 51 U/L    Comment: Performed at [facility], [address], [place], [place] [address]  Comprehensive metabolic panel     Status: Abnormal   Collection Time: 04/16/22  6:09 PM  Result Value Ref Range   Sodium 137 135 - 145 mmol/L   Potassium 3.6 3.5 - 5.1 mmol/L   Chloride 104 98 - 111 mmol/L   CO2 23 22 - 32 mmol/L   Glucose, Bld 259 (H) 70 - 99 mg/dL    Comment: Glucose reference range applies only to samples taken after fasting for at least 8 hours.   BUN 14 6 - 20 mg/dL   Creatinine, Ser 0.69 0.44 - 1.00 mg/dL   Calcium 8.9 8.9 - 10.3 mg/dL   Total Protein 7.8 6.5 - 8.1 g/dL   Albumin 4.2 3.5 - 5.0 g/dL   AST 45 (H) 15 - 41 U/L   ALT 63 (H) 0 - 44 U/L   Alkaline Phosphatase 62 38 - 126 U/L   Total Bilirubin 1.1 0.3 - 1.2 mg/dL   GFR, Estimated >60 >60 mL/min    Comment: (NOTE) Calculated using the CKD-EPI Creatinine Equation (2021)    Anion gap 10 5 - 15    Comment: Performed at [facility], [address], [place], [place] [address]  CBC     Status: None   Collection Time: 04/16/22  6:09 PM  Result Value Ref Range   WBC 9.5 4.0 - 10.5 K/uL   RBC 4.88 3.87 - 5.11 MIL/uL   Hemoglobin 14.1 12.0 - 15.0 g/dL   HCT 44.0 36.0 - 46.0 %   MCV 90.2 80.0 - 100.0 fL   MCH 28.9 26.0 - 34.0 pg   MCHC 32.0 30.0 - 36.0 g/dL   RDW 14.6 11.5 - 15.5 %   Platelets 294 150 - 400 K/uL   nRBC 0.0 0.0 - 0.2 %    Comment: Performed at [facility], [address] [place], [place] [address]  Urinalysis, Routine w reflex microscopic Urine, Clean Catch     Status: Abnormal   Collection Time: 04/16/22  7:25 PM  Result Value Ref Range   Color, Urine YELLOW YELLOW    APPearance CLEAR CLEAR   Specific Gravity, Urine 1.024 1.005 - 1.030   pH 5.0 5.0 - 8.0   Glucose, UA 150 (A) NEGATIVE mg/dL   Hgb urine dipstick NEGATIVE NEGATIVE   Bilirubin Urine NEGATIVE NEGATIVE   Ketones, ur 20 (A) NEGATIVE mg/dL   Protein, ur 30 (A) NEGATIVE mg/dL   Nitrite NEGATIVE NEGATIVE   Leukocytes,Ua SMALL (A) NEGATIVE   RBC / HPF 0-5 0 - 5 RBC/hpf   WBC, UA 11-20 0 - 5 WBC/hpf   Bacteria, UA NONE SEEN NONE SEEN   Squamous Epithelial / HPF 0-5 0 - 5   Mucus PRESENT     Comment: Performed at [facility], [address], [place], [place] [address]  HgB A1c     Status: Abnormal   Collection Time: 06/22/22  9:11 AM  Result Value Ref Range   Hemoglobin A1C 8.6 (A) 4.0 - 5.6 %   HbA1c POC (<> result, manual entry)     HbA1c, POC (prediabetic range)     HbA1c, POC (controlled diabetic range)         Assessment & Plan:   1) Uncontrolled type 2 diabetes mellitus with complication, with long-term current use of insulin (HCC)  She presents today with her CGM and meter showing slightly above  target glycemic profile overall, however slowly improving in recent weeks.  Her POCT A1c today is 8.6%, increasing from last visit of 8.2%.  She admits to cheating on her diet over the holidays and was off her Ozempic several weeks due to surgical procedures.  Analysis of her CGM shows TIR 24%, TAR 75%, TBR 1% with a GMI of 8.4%.  -Her diabetes is complicated by history of noncompliance (she recently showed better engagement) and patient remains at extremely high risk for more acute and chronic complications of diabetes which include CAD, CVA, CKD, retinopathy, and neuropathy. These are all discussed in detail with the patient.  - Nutritional counseling repeated at each appointment due to patients tendency to fall back in to old habits.  - The patient admits there is a room for improvement in their diet and drink choices. -  Suggestion is made for the patient to avoid simple carbohydrates  from their diet including Cakes, Sweet Desserts / Pastries, Ice Cream, Soda (diet and regular), Sweet Tea, Candies, Chips, Cookies, Sweet Pastries, Store Bought Juices, Alcohol in Excess of 1-2 drinks a day, Artificial Sweeteners, Coffee Creamer, and "Sugar-free" Products. This will help patient to have stable blood glucose profile and potentially avoid unintended weight gain.   - I encouraged the patient to switch to unprocessed or minimally processed complex starch and increased protein intake (animal or plant source), fruits, and vegetables.   - Patient is advised to stick to a routine mealtimes to eat 3 meals a day and avoid unnecessary snacks (to snack only to correct hypoglycemia).  - I have approached patient with the following individualized plan to manage diabetes and patient agrees.  -She will continue to require intensive treatment with  basal/bolus insulin in order for her to achieve and maintain control of diabetes to target.  -Despite her worsening A1c, no changes will be made to her medications today as she appears to be getting back on track after the holidays.  She is advised to continue her Lantus 60 units SQ nightly and Novolog 20-26 units TID with meals if glucose is above 90 and she is eating (Specific instructions on how to titrate insulin dosage based on glucose readings given to patient in writing). She can also continue her Metformin 500 mg ER twice daily with meals and Ozempic 1 mg SQ weekly.   -She is encouraged to continue monitoring blood glucose 4 times daily-using her CGM, before meals and before bed, and to call the clinic if she has readings less than 70 or greater than 300 for 3 tests in a row.  She has benefited from CGM device and is advised to continue.  2) Lipids/HPL:  Her most recent lipid panel from 01/16/22 shows controlled LDL of 91.  She was taken off her Statin due to elevated liver enzymes- which are now improving steadily.  Side effects and precautions  discussed with her.    3) Hypertension:  Her blood pressure is controlled to target.  She is advised to continue Lisinopril 20 mg po daily (managed by her PCP).  4) Hypothyroidism:  -There are no recent TFTs to review.  She is advised to continue her current dose of Levothyroxine 75 mcg po daily before breakfast (skipping 2 days out of the week). Will recheck TFTs prior to next visit.   - We discussed about the correct intake of her thyroid hormone, on empty stomach at fasting, with water, separated by at least 30 minutes from breakfast and other medications,  and separated  by more than 4 hours from calcium, iron, multivitamins, acid reflux medications (PPIs). -Patient is made aware of the fact that thyroid hormone replacement is needed for life, dose to be adjusted by periodic monitoring of thyroid function tests.    - I advised patient to maintain close follow up with Lindell Spar, MD for primary care needs.     I spent 25 minutes in the care of the patient today including review of labs from [place], Lipids, Thyroid Function, Hematology (current and previous including abstractions from other facilities); face-to-face time discussing  her blood glucose readings/logs, discussing hypoglycemia and hyperglycemia episodes and symptoms, medications doses, her options of short and long term treatment based on the latest standards of care / guidelines;  discussion about incorporating lifestyle medicine;  and documenting the encounter. Risk reduction counseling performed per USPSTF guidelines to reduce obesity and cardiovascular risk factors.     Please refer to Patient Instructions for Blood Glucose Monitoring and Insulin/Medications Dosing Guide"  in media tab for additional information. Please  also refer to " Patient Self Inventory" in the Media  tab for reviewed elements of pertinent patient history.  Gillian Scarce participated in the discussions, expressed understanding, and voiced agreement  with the above plans.  All questions were answered to her satisfaction. she is encouraged to contact clinic should she have any questions or concerns prior to her return visit.    Follow up plan: -Return in about 4 months (around 10/21/2022) for Diabetes F/U with A1c in office, Thyroid follow up, Previsit labs, Bring meter and logs.  Rayetta Pigg, [facility] [address] [place], [place] [address] Phone: [phone] Fax: [phone]   06/22/2022, 9:20 AM

## 2022-07-11 DIAGNOSIS — E1151 Type 2 diabetes mellitus with diabetic peripheral angiopathy without gangrene: Secondary | ICD-10-CM | POA: Diagnosis not present

## 2022-07-11 DIAGNOSIS — B351 Tinea unguium: Secondary | ICD-10-CM | POA: Diagnosis not present

## 2022-07-11 DIAGNOSIS — L600 Ingrowing nail: Secondary | ICD-10-CM | POA: Diagnosis not present

## 2022-07-11 DIAGNOSIS — L03032 Cellulitis of left toe: Secondary | ICD-10-CM | POA: Diagnosis not present

## 2022-07-12 ENCOUNTER — Other Ambulatory Visit: Payer: Self-pay | Admitting: Internal Medicine

## 2022-07-13 ENCOUNTER — Encounter: Payer: Self-pay | Admitting: Internal Medicine

## 2022-07-13 ENCOUNTER — Telehealth (INDEPENDENT_AMBULATORY_CARE_PROVIDER_SITE_OTHER): Payer: Medicaid Other | Admitting: Internal Medicine

## 2022-07-13 DIAGNOSIS — J069 Acute upper respiratory infection, unspecified: Secondary | ICD-10-CM | POA: Diagnosis not present

## 2022-07-13 HISTORY — DX: Acute upper respiratory infection, unspecified: J06.9

## 2022-07-13 MED ORDER — CHLORASEPTIC 1.4 % MT LIQD: 1.0000 | OROMUCOSAL | 0 refills | Status: DC | PRN

## 2022-07-13 NOTE — Progress Notes (Signed)
Virtual Visit via Video Note  I connected with Sandy Valencia on 07/13/22 at  9:00 AM EST by a video enabled telemedicine application and verified that I am speaking with the correct person using two identifiers.  Patient Location: Home Provider Location: Office/Clinic  I discussed the limitations, risks, security, and privacy concerns of performing an evaluation and management service by video and the availability of in person appointments. I also discussed with the patient that there may be a patient responsible charge related to this service. The patient expressed understanding and agreed to proceed.  Subjective: PCP: Sandy Spar, MD  Chief Complaint  Patient presents with   Sore Throat    Sore throat and drainage. No fever, phlegm come up this morning.     Sandy Valencia has been evaluated today through an acute video encounter for symptoms of throat irritation and nasal congestion.  She reports onset of symptoms last night. She has been able to eat food and drink liquids without difficulty.  She attempted to manage her symptoms yesterday with cough drops.  This morning she continued to have irritation in her throat and had to clear her throat of a lot of phlegm.  She denies fever/chills, cough, sinus pain/pressure, and is unaware of any recent sick contacts.  Overall her symptoms are not worse than yesterday, but also have not significantly improved.  She is scheduled today's appointment because we are going into the weekend and she is interested in recommendations for medication for relief of throat irritation  ROS: Per HPI  Current Outpatient Medications:    Accu-Chek FastClix Lancets MISC, USE TO TEST 4 TIMES DAILY., Disp: 102 each, Rfl: 0   Accu-Chek FastClix Lancets MISC, USE TO TEST 4 TIMES DAILY., Disp: 102 each, Rfl: 0   ACCU-CHEK GUIDE test strip, USE TO TEST 4 TIMES DAILY., Disp: 100 strip, Rfl: 0   albuterol (VENTOLIN HFA) 108 (90 Base) MCG/ACT inhaler, Inhale 2 puffs  into the lungs every 6 (six) hours as needed for wheezing or shortness of breath., Disp: 8 g, Rfl: 2   aspirin EC 81 MG tablet, Take 81 mg by mouth daily., Disp: , Rfl:    cetirizine (ZYRTEC) 10 MG tablet, Take 1 tablet (10 mg total) by mouth daily as needed for allergies., Disp: 30 tablet, Rfl: 5   Cholecalciferol (VITAMIN D-3) 1000 UNITS CAPS, Take 1,000 Units by mouth daily. , Disp: , Rfl:    Continuous Blood Gluc Sensor (DEXCOM G6 SENSOR) MISC, CHANGE SENSOR EVERY 10 DAYS AS DIRECTED., Disp: 3 each, Rfl: 2   Continuous Blood Gluc Transmit (DEXCOM G6 TRANSMITTER) MISC, CHANGE TRANSMITTER EVERY 90 DAYS AS DIRECTED., Disp: 1 each, Rfl: 1   diclofenac Sodium (VOLTAREN) 1 % GEL, APPLY 2 GRAMS TO AFFECTED AREAS 2 TIMES A DAY AS NEEDED, Disp: 100 g, Rfl: 0   EPINEPHrine 0.3 mg/0.3 mL IJ SOAJ injection, Inject 0.3 mg into the muscle as needed for anaphylaxis., Disp: 2 each, Rfl: 1   fluticasone (FLONASE) 50 MCG/ACT nasal spray, Place 1 spray into both nostrils daily., Disp: 16 g, Rfl: 3   HYDROcodone-acetaminophen (NORCO) 7.5-325 MG tablet, Take 1 tablet by mouth 2 times daily at 12 noon and 4 pm., Disp: , Rfl:    insulin aspart (NOVOLOG FLEXPEN) 100 UNIT/ML FlexPen, Inject 20-26 Units into the skin 3 (three) times daily with meals., Disp: 60 mL, Rfl: 3   ketoconazole (NIZORAL) 2 % cream, APPLY TO AFFECTED AREAS TWICE DAILY (Patient taking differently: Apply 1 Application  topically 2 (two) times daily.), Disp: 60 g, Rfl: 2   LANTUS SOLOSTAR 100 UNIT/ML Solostar Pen, Inject 60 Units into the skin at bedtime., Disp: 60 mL, Rfl: 3   levothyroxine (SYNTHROID) 75 MCG tablet, TAKE ONE (1) TABLET BY MOUTH EVERY DAY BEFORE BREAKFAST, Disp: 90 tablet, Rfl: 0   lisinopril (ZESTRIL) 20 MG tablet, TAKE ONE TABLET (20MG TOTAL) BY MOUTH DAILY, Disp: 90 tablet, Rfl: 1   LITETOUCH PEN NEEDLES 31G X 8 MM MISC, USE AS DIRECTED FOUR TIMES DAILY., Disp: 100 each, Rfl: 0   metFORMIN (GLUCOPHAGE-XR) 500 MG 24 hr tablet, TAKE  ONE TABLET BY MOUTH TWICE A DAY AFTER A MEAL, Disp: 180 tablet, Rfl: 0   OZEMPIC, 1 MG/DOSE, 4 MG/3ML SOPN, INJECT 1MG ONCE A WEEK AS DIRECTED, Disp: 9 mL, Rfl: 0   pantoprazole (PROTONIX) 40 MG tablet, Take 1 tablet (40 mg total) by mouth daily., Disp: 30 tablet, Rfl: 11   phenol (CHLORASEPTIC) 1.4 % LIQD, Use as directed 1 spray in the mouth or throat as needed for throat irritation / pain., Disp: 118 mL, Rfl: 0   pregabalin (LYRICA) 100 MG capsule, Take 100 mg by mouth 2 (two) times daily., Disp: , Rfl:    vitamin B-12 (CYANOCOBALAMIN) 500 MCG tablet, Take 500 mcg by mouth daily., Disp: , Rfl:   Assessment and Plan: Viral URI Assessment & Plan: She has been evaluated today for nasal congestion with throat irritation.  Symptoms began yesterday (2/8).  She denies fever/chills, cough or sputum production, sinus pain/pressure, and does not have any recent sick contacts.  She is concerned about her throat irritation, but has been able to eat and drink without difficulty.  Her current symptoms seem most consistent with a viral upper respiratory infection. -I have recommended routine use of nasal saline rinse followed by fluticasone nasal spray.  I have also recommended starting a daily antihistamine, such as cetirizine. -I have prescribed Chloraseptic spray for relief of throat irritation -She has declined COVID/influenza testing -She was instructed to return to care if she develops fever/chills, cough with yellow/green sputum production, significant shortness of breath, or odynophagia.  Otherwise she has previously scheduled follow-up with Dr. Posey Pronto on 2/16.  Orders: -     Chloraseptic; Use as directed 1 spray in the mouth or throat as needed for throat irritation / pain.  Dispense: 118 mL; Refill: 0   Follow Up Instructions: Return if symptoms worsen or fail to improve.   I discussed the assessment and treatment plan with the patient. The patient was provided an opportunity to ask questions,  and all were answered. The patient agreed with the plan and demonstrated an understanding of the instructions.   The patient was advised to call back or seek an in-person evaluation if the symptoms worsen or if the condition fails to improve as anticipated.  The above assessment and management plan was discussed with the patient. The patient verbalized understanding of and has agreed to the management plan.   Johnette Abraham, MD

## 2022-07-13 NOTE — Assessment & Plan Note (Signed)
She has been evaluated today for nasal congestion with throat irritation.  Symptoms began yesterday (2/8).  She denies fever/chills, cough or sputum production, sinus pain/pressure, and does not have any recent sick contacts.  She is concerned about her throat irritation, but has been able to eat and drink without difficulty.  Her current symptoms seem most consistent with a viral upper respiratory infection. -I have recommended routine use of nasal saline rinse followed by fluticasone nasal spray.  I have also recommended starting a daily antihistamine, such as cetirizine. -I have prescribed Chloraseptic spray for relief of throat irritation -She has declined COVID/influenza testing -She was instructed to return to care if she develops fever/chills, cough with yellow/green sputum production, significant shortness of breath, or odynophagia.  Otherwise she has previously scheduled follow-up with Dr. Posey Pronto on 2/16.

## 2022-07-20 ENCOUNTER — Encounter: Payer: Self-pay | Admitting: Internal Medicine

## 2022-07-20 ENCOUNTER — Ambulatory Visit: Payer: Medicaid Other | Admitting: Internal Medicine

## 2022-07-20 VITALS — BP 162/84 | HR 78 | Ht 66.0 in | Wt 225.4 lb

## 2022-07-20 DIAGNOSIS — J0110 Acute frontal sinusitis, unspecified: Secondary | ICD-10-CM | POA: Diagnosis not present

## 2022-07-20 DIAGNOSIS — M5416 Radiculopathy, lumbar region: Secondary | ICD-10-CM

## 2022-07-20 DIAGNOSIS — E1165 Type 2 diabetes mellitus with hyperglycemia: Secondary | ICD-10-CM | POA: Diagnosis not present

## 2022-07-20 DIAGNOSIS — J0111 Acute recurrent frontal sinusitis: Secondary | ICD-10-CM | POA: Insufficient documentation

## 2022-07-20 DIAGNOSIS — Z794 Long term (current) use of insulin: Secondary | ICD-10-CM | POA: Diagnosis not present

## 2022-07-20 DIAGNOSIS — I10 Essential (primary) hypertension: Secondary | ICD-10-CM | POA: Diagnosis not present

## 2022-07-20 DIAGNOSIS — E039 Hypothyroidism, unspecified: Secondary | ICD-10-CM

## 2022-07-20 MED ORDER — LISINOPRIL 40 MG PO TABS: 40.0000 mg | ORAL_TABLET | Freq: Every day | ORAL | 3 refills | Status: DC

## 2022-07-20 MED ORDER — BENZONATATE 200 MG PO CAPS: 200.0000 mg | ORAL_CAPSULE | Freq: Two times a day (BID) | ORAL | 0 refills | Status: DC | PRN

## 2022-07-20 MED ORDER — AZITHROMYCIN 250 MG PO TABS: ORAL_TABLET | ORAL | 0 refills | Status: AC

## 2022-07-20 NOTE — Assessment & Plan Note (Signed)
Lab Results  Component Value Date   TSH 1.050 01/16/2022   On levothyroxine 75 mcg QD, followed by Endocrinology

## 2022-07-20 NOTE — Assessment & Plan Note (Signed)
On Lyrica and Norco PRN Followed by pain clinic

## 2022-07-20 NOTE — Progress Notes (Addendum)
Established Patient Office Visit  Subjective:  Patient ID: Sandy Valencia, female    DOB: [birth year]  Age: 62 y.o. MRN: [id number]  CC:  Chief Complaint  Patient presents with   Hypertension    Follow up for hypertension and diabetes. Patient does have a cough with mucus which is clear    HPI Sandy Valencia is a 62 y.o. female with past medical history of HTN, DM, hypothyroidism, NAFLD, GERD, chronic pain syndrome and morbid obesity who presents for follow up of her chronic medical conditions.  She complains of nasal congestion, postnasal drip and sore throat for the last 5 days.  She denies any fever or chills.  She was given phenol throat spray recently for sore throat, which has improved her sore throat.  She complains of chronic low back pain, which is sharp, 7-10/10 and worse with movement.  Denies any recent fall or injury.  Of note, she has history of lumbar spondylosis, and takes Lyrica and Norco for it, followed by pain clinic.  She currently denies any numbness or weakness of the LE.  Denies any saddle anesthesia, urinary or stool incontinence.  HTN: Her BP was elevated today, which could be due to her severe low back pain.  She is on lisinopril 20 mg QD. She denies any headache, dizziness, chest pain, dyspnea or palpitations.  Type II DM: Followed by endocrinology.  She has started taking Ozempic.  She takes Lantus 60 units nightly and takes NovoLog ISS in addition to metformin.  Her blood glucose has been ranging around 120-200 most of the time, she has Dexcom currently.     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   Anaphylactic shock, unspecified, subsequent encounter    Anemia, iron deficiency 02/14/2015   Angio-edema    Arthritis    Asthma    Asymptomatic varicose veins of right lower extremity    Chronic obstructive pulmonary disease with (acute) exacerbation (HCC)     Chronic rhinitis 05/21/2018   COVID-19    Diabetes mellitus    Essential (primary) hypertension    Family history of colon cancer 09/03/2012   Fatty liver    Gastro-esophageal reflux disease without esophagitis    GERD (gastroesophageal reflux disease)    History of colonic polyps 08/26/2017   Hypercholesteremia    Hypertension    Hypothyroidism    Low back pain    Mass of finger, right s/p surgical excision (mucoid cyst) 04/17/18 04/22/2018   Mucous cyst of digit of right hand    Obesity, unspecified    Other adverse food reactions, not elsewhere classified, initial encounter    Personal history of noncompliance with medical treatment, presenting hazards to health 05/18/2015   Rash and other nonspecific skin eruption    Sleep apnea    pt siad, "i had small amount but could not tolerate CPAP and they said it was ok since it was mild.   Sleep apnea, unspecified    Unspecified asthma, uncomplicated    Viral URI 07/13/2022   Zoster without complications     Past Surgical History:  Procedure Laterality Date   ABDOMINAL HYSTERECTOMY     CATARACT EXTRACTION W/PHACO Left 03/09/2022   Procedure: CATARACT EXTRACTION PHACO AND INTRAOCULAR LENS PLACEMENT (IOC);  Surgeon: Baruch Goldmann, MD;  Location: [facility];  Service: Ophthalmology;  Laterality: Left;  CDE 7.89   CATARACT EXTRACTION W/PHACO Right 03/23/2022  Procedure: CATARACT EXTRACTION PHACO AND INTRAOCULAR LENS PLACEMENT (IOC);  Surgeon: Baruch Goldmann, MD;  Location: [facility];  Service: Ophthalmology;  Laterality: Right;  CDE: 2.63   CHOLECYSTECTOMY     COLONOSCOPY N/A 09/22/2012   RMR: colonic polyps -removed as described above. tubular adenoma, next TCS 09/2017   COLONOSCOPY WITH PROPOFOL N/A 09/23/2017   Procedure: COLONOSCOPY WITH PROPOFOL;  Surgeon: Daneil Dolin, MD;  Location: [facility];  Service: Endoscopy;  Laterality: N/A;  7:30am   CYST EXCISION Left 07/19/2016   Procedure: CYST REMOVAL LEFT RING FINGER;  Surgeon: Carole Civil, MD;  Location: [facility];  Service: Orthopedics;  Laterality: Left;   CYST REMOVAL LEG Right    foot   ESOPHAGOGASTRODUODENOSCOPY N/A 10/21/2014   RMR: Small hiatal hernia; otherwise normal EGD status post passage of a Maloney dilator.    EXCISION MASS UPPER EXTREMETIES Right 04/17/2018   Procedure: EXCISION MASS UPPER EXTREMETIES right ring finger;  Surgeon: Carole Civil, MD;  Location: [facility];  Service: Orthopedics;  Laterality: Right;   MALONEY DILATION N/A 10/21/2014   Procedure: [name] DILATION;  Surgeon: Daneil Dolin, MD;  Location: [facility];  Service: Endoscopy;  Laterality: N/A;   MIDDLE EAR SURGERY Right    patched hole in ear drum   POLYPECTOMY  09/23/2017   Procedure: POLYPECTOMY;  Surgeon: Daneil Dolin, MD;  Location: [facility];  Service: Endoscopy;;  polyp hepatic flexure polyp cs, splenic flexure polyp cs, rectal polyp cs   TOTAL ABDOMINAL HYSTERECTOMY      Family History  Problem Relation Age of Onset   Colon cancer Mother        diagnosed age 28, underwent surgical resection, metastatic disease several years later   Diabetes Mother    Heart attack Father    Diabetes Sister    Hypothyroidism Brother    Diabetes Sister    Diabetes Sister    Diabetes Sister    Hypothyroidism Sister    Allergic rhinitis Neg Hx    Angioedema Neg Hx    Asthma Neg Hx    Atopy Neg Hx    Eczema Neg Hx    Immunodeficiency Neg Hx    Urticaria Neg Hx     Social History   Socioeconomic History   Marital status: Divorced    Spouse name: Not on file   Number of children: 2   Years of education: Not on file   Highest education level: Not on file  Occupational History   Not on file  Tobacco Use   Smoking status: Never   Smokeless tobacco: Never  Vaping Use   Vaping Use: Never used  Substance and Sexual Activity   Alcohol use: No    Alcohol/week: 0.0 standard drinks of alcohol   Drug use: No   Sexual activity: Yes    Birth control/protection:  Surgical  Other Topics Concern   Not on file  Social History Narrative   Lives with grandson (since born)      Enjoys: vacation, travel, concerts      Diet: eats all food groups    Caffeine: diet Pepsi daily    Water:  8 cups daily       Wears seat belt    Does use phone   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    Social Determinants of Health   Financial Resource Strain: Low Risk  (12/02/2019)   Overall Financial Resource Strain (CARDIA)    Difficulty  of Paying Living Expenses: Not hard at all  Food Insecurity: No Food Insecurity (12/02/2019)   Hunger Vital Sign    Worried About Running Out of Food in the Last Year: Never true    Ran Out of Food in the Last Year: Never true  Transportation Needs: No Transportation Needs (12/02/2019)   PRAPARE - Hydrologist (Medical): No    Lack of Transportation (Non-Medical): No  Physical Activity: Insufficiently Active (12/02/2019)   Exercise Vital Sign    Days of Exercise per Week: 2 days    Minutes of Exercise per Session: 20 min  Stress: Not on file  Social Connections: Moderately Isolated (12/02/2019)   Social Connection and Isolation Panel [NHANES]    Frequency of Communication with Friends and Family: More than three times a week    Frequency of Social Gatherings with Friends and Family: More than three times a week    Attends Religious Services: More than 4 times per year    Active Member of [employer] or Organizations: No    Attends Archivist Meetings: Never    Marital Status: Divorced  Human resources officer Violence: Not At Risk (12/02/2019)   Humiliation, Afraid, Rape, and Kick questionnaire    Fear of Current or Ex-Partner: No    Emotionally Abused: No    Physically Abused: No    Sexually Abused: No    Outpatient Medications Prior to Visit  Medication Sig Dispense Refill   Accu-Chek FastClix Lancets MISC USE TO TEST 4 TIMES DAILY. 102 each 0   Accu-Chek FastClix Lancets MISC USE TO TEST 4  TIMES DAILY. 102 each 0   ACCU-CHEK GUIDE test strip USE TO TEST 4 TIMES DAILY. 100 strip 0   albuterol (VENTOLIN HFA) 108 (90 Base) MCG/ACT inhaler Inhale 2 puffs into the lungs every 6 (six) hours as needed for wheezing or shortness of breath. 8 g 2   aspirin EC 81 MG tablet Take 81 mg by mouth daily.     cetirizine (ZYRTEC) 10 MG tablet Take 1 tablet (10 mg total) by mouth daily as needed for allergies. 30 tablet 5   Cholecalciferol (VITAMIN D-3) 1000 UNITS CAPS Take 1,000 Units by mouth daily.      Continuous Blood Gluc Sensor (DEXCOM G6 SENSOR) MISC CHANGE SENSOR EVERY 10 DAYS AS DIRECTED. 3 each 2   Continuous Blood Gluc Transmit (DEXCOM G6 TRANSMITTER) MISC CHANGE TRANSMITTER EVERY 90 DAYS AS DIRECTED. 1 each 1   diclofenac Sodium (VOLTAREN) 1 % GEL APPLY 2 GRAMS TO AFFECTED AREAS 2 TIMES A DAY AS NEEDED 100 g 0   EPINEPHrine 0.3 mg/0.3 mL IJ SOAJ injection Inject 0.3 mg into the muscle as needed for anaphylaxis. 2 each 1   fluticasone (FLONASE) 50 MCG/ACT nasal spray Place 1 spray into both nostrils daily. 16 g 3   HYDROcodone-acetaminophen (NORCO) 7.5-325 MG tablet Take 1 tablet by mouth 2 times daily at 12 noon and 4 pm.     insulin aspart (NOVOLOG FLEXPEN) 100 UNIT/ML FlexPen Inject 20-26 Units into the skin 3 (three) times daily with meals. 60 mL 3   ketoconazole (NIZORAL) 2 % cream APPLY TO AFFECTED AREAS TWICE DAILY (Patient taking differently: Apply 1 Application topically 2 (two) times daily.) 60 g 2   LANTUS SOLOSTAR 100 UNIT/ML Solostar Pen Inject 60 Units into the skin at bedtime. 60 mL 3   levothyroxine (SYNTHROID) 75 MCG tablet TAKE ONE (1) TABLET BY MOUTH EVERY DAY BEFORE BREAKFAST 90 tablet 0  LITETOUCH PEN NEEDLES 31G X 8 MM MISC USE AS DIRECTED FOUR TIMES DAILY. 100 each 0   metFORMIN (GLUCOPHAGE-XR) 500 MG 24 hr tablet TAKE ONE TABLET BY MOUTH TWICE A DAY AFTER A MEAL 180 tablet 0   OZEMPIC, 1 MG/DOSE, 4 MG/3ML SOPN INJECT 1MG ONCE A WEEK AS DIRECTED 9 mL 0    pantoprazole (PROTONIX) 40 MG tablet Take 1 tablet (40 mg total) by mouth daily. 30 tablet 11   pregabalin (LYRICA) 100 MG capsule Take 100 mg by mouth 2 (two) times daily.     vitamin B-12 (CYANOCOBALAMIN) 500 MCG tablet Take 500 mcg by mouth daily.     lisinopril (ZESTRIL) 20 MG tablet TAKE ONE TABLET (20MG TOTAL) BY MOUTH DAILY 90 tablet 1   phenol (CHLORASEPTIC) 1.4 % LIQD Use as directed 1 spray in the mouth or throat as needed for throat irritation / pain. 118 mL 0   No facility-administered medications prior to visit.    Allergies  Allergen Reactions   Kiwi Extract Anaphylaxis, Swelling and Palpitations   Other Anaphylaxis    Walnuts (avoids all tree nuts)   Statins     Transaminase elevation    ROS Review of Systems  Constitutional:  Negative for chills and fever.  HENT:  Positive for congestion, sinus pressure and sinus pain.   Eyes:  Negative for pain and discharge.  Respiratory:  Positive for cough. Negative for shortness of breath.   Cardiovascular:  Negative for chest pain and palpitations.  Gastrointestinal:  Negative for abdominal pain, diarrhea, nausea and vomiting.  Endocrine: Negative for polydipsia and polyuria.  Genitourinary:  Negative for dysuria and hematuria.  Musculoskeletal:  Positive for arthralgias and back pain. Negative for neck pain and neck stiffness.  Skin:  Positive for color change (Mole over back).  Neurological:  Negative for dizziness and weakness.  Psychiatric/Behavioral:  Negative for agitation and behavioral problems.       Objective:    Physical Exam Vitals reviewed.  Constitutional:      General: She is not in acute distress.    Appearance: She is obese. She is not diaphoretic.  HENT:     Head: Normocephalic and atraumatic.     Nose: Congestion present.     Right Sinus: Frontal sinus tenderness present.     Left Sinus: Frontal sinus tenderness present.     Mouth/Throat:     Mouth: Mucous membranes are moist.     Pharynx: No  posterior oropharyngeal erythema.  Eyes:     General: No scleral icterus.    Extraocular Movements: Extraocular movements intact.  Cardiovascular:     Rate and Rhythm: Normal rate and regular rhythm.     Pulses: Normal pulses.     Heart sounds: Normal heart sounds. No murmur heard. Pulmonary:     Breath sounds: Normal breath sounds. No wheezing or rales.  Musculoskeletal:     Cervical back: Neck supple. No tenderness.     Right lower leg: No edema.     Left lower leg: No edema.  Skin:    General: Skin is warm.  Neurological:     General: No focal deficit present.     Mental Status: She is alert and oriented to person, place, and time.  Psychiatric:        Mood and Affect: Mood normal.        Behavior: Behavior normal.     BP (!) 162/84 (BP Location: Left Arm, Cuff Size: Normal)   Pulse 78  Ht 5' 6"$  (1.676 m)   Wt 225 lb 6.4 oz (102.2 kg)   SpO2 96%   BMI 36.38 kg/m  Wt Readings from Last 3 Encounters:  07/20/22 225 lb 6.4 oz (102.2 kg)  06/22/22 223 lb (101.2 kg)  04/25/22 229 lb 9.6 oz (104.1 kg)    Lab Results  Component Value Date   TSH 1.050 01/16/2022   Lab Results  Component Value Date   WBC 9.5 04/16/2022   HGB 14.1 04/16/2022   HCT 44.0 04/16/2022   MCV 90.2 04/16/2022   PLT 294 04/16/2022   Lab Results  Component Value Date   NA 137 04/16/2022   K 3.6 04/16/2022   CO2 23 04/16/2022   GLUCOSE 259 (H) 04/16/2022   BUN 14 04/16/2022   CREATININE 0.69 04/16/2022   BILITOT 1.1 04/16/2022   ALKPHOS 62 04/16/2022   AST 45 (H) 04/16/2022   ALT 63 (H) 04/16/2022   PROT 7.8 04/16/2022   ALBUMIN 4.2 04/16/2022   CALCIUM 8.9 04/16/2022   ANIONGAP 10 04/16/2022   EGFR 90 01/16/2022   Lab Results  Component Value Date   CHOL 160 01/16/2022   Lab Results  Component Value Date   HDL 55 01/16/2022   Lab Results  Component Value Date   LDLCALC 91 01/16/2022   Lab Results  Component Value Date   TRIG 71 01/16/2022   Lab Results  Component  Value Date   CHOLHDL 2.9 01/16/2022   Lab Results  Component Value Date   HGBA1C 8.6 (A) 06/22/2022      Assessment & Plan:   Problem List Items Addressed This Visit       Cardiovascular and Mediastinum   Essential hypertension - Primary    BP Readings from Last 1 Encounters:  07/20/22 (!) 162/84  Uncontrolled with Lisinopril 20 mg QD now Increased dose of lisinopril to 40 mg QD Counseled for compliance with the medications Advised DASH diet and moderate exercise/walking, at least 150 mins/week      Relevant Medications   lisinopril (ZESTRIL) 40 MG tablet     Respiratory   Acute non-recurrent frontal sinusitis    Persistent symptoms despite OTC decongestants and antitussive Started empiric azithromycin Tessalon PRN for cough      Relevant Medications   azithromycin (ZITHROMAX) 250 MG tablet   benzonatate (TESSALON) 200 MG capsule     Endocrine   Type 2 diabetes mellitus with hyperglycemia (HCC)    Lab Results  Component Value Date   HGBA1C 8.6 (A) 06/22/2022  Uncontrolled Follows up with Endocrinology On Lantus 60 units and NovoLog ISS Continue metformin and Ozempic Tried to give her statin in the past, had transaminitis -will try again to add statin later Takes Lyrica for neuropathy/low back pain      Relevant Medications   lisinopril (ZESTRIL) 40 MG tablet   Hypothyroidism    Lab Results  Component Value Date   TSH 1.050 01/16/2022  On levothyroxine 75 mcg QD, followed by Endocrinology        Nervous and Auditory   Lumbar radiculopathy    On Lyrica and Norco PRN Followed by pain clinic        Other   Morbid obesity ([place])    Diet modification and moderate exercise advised On Ozempic for type II DM      Meds ordered this encounter  Medications   DISCONTD: lisinopril (ZESTRIL) 40 MG tablet    Sig: Take 1 tablet (40 mg total) by mouth daily.  Dispense:  90 tablet    Refill:  3   azithromycin (ZITHROMAX) 250 MG tablet    Sig: Take 2  tablets on day 1, then 1 tablet daily on days 2 through 5    Dispense:  6 tablet    Refill:  0   benzonatate (TESSALON) 200 MG capsule    Sig: Take 1 capsule (200 mg total) by mouth 2 (two) times daily as needed for cough.    Dispense:  20 capsule    Refill:  0   lisinopril (ZESTRIL) 40 MG tablet    Sig: Take 1 tablet (40 mg total) by mouth daily.    Dispense:  90 tablet    Refill:  3    Follow-up: Return in about 2 months (around 09/18/2022) for HTN.    Lindell Spar, MD

## 2022-07-20 NOTE — Assessment & Plan Note (Signed)
Diet modification and moderate exercise advised °On Ozempic for type II DM °

## 2022-07-20 NOTE — Patient Instructions (Signed)
Please start taking Lisinopril 40 mg once daily instead of 20 mg.  Please continue to take other medications as prescribed.  Please continue to follow low carb diet and ambulate as tolerated.

## 2022-07-20 NOTE — Assessment & Plan Note (Addendum)
Lab Results  Component Value Date   HGBA1C 8.6 (A) 06/22/2022   Uncontrolled Follows up with Endocrinology On Lantus 60 units and NovoLog ISS Continue metformin and Ozempic Tried to give her statin in the past, had transaminitis -will try again to add statin later Takes Lyrica for neuropathy/low back pain

## 2022-07-20 NOTE — Assessment & Plan Note (Signed)
Persistent symptoms despite OTC decongestants and antitussive Started empiric azithromycin Tessalon PRN for cough

## 2022-07-20 NOTE — Assessment & Plan Note (Signed)
BP Readings from Last 1 Encounters:  07/20/22 (!) 162/84   Uncontrolled with Lisinopril 20 mg QD now Increased dose of lisinopril to 40 mg QD Counseled for compliance with the medications Advised DASH diet and moderate exercise/walking, at least 150 mins/week

## 2022-07-28 ENCOUNTER — Other Ambulatory Visit: Payer: Self-pay | Admitting: Nurse Practitioner

## 2022-07-31 DIAGNOSIS — M5116 Intervertebral disc disorders with radiculopathy, lumbar region: Secondary | ICD-10-CM | POA: Diagnosis not present

## 2022-07-31 DIAGNOSIS — M25549 Pain in joints of unspecified hand: Secondary | ICD-10-CM | POA: Diagnosis not present

## 2022-07-31 DIAGNOSIS — Z79891 Long term (current) use of opiate analgesic: Secondary | ICD-10-CM | POA: Diagnosis not present

## 2022-07-31 DIAGNOSIS — G894 Chronic pain syndrome: Secondary | ICD-10-CM | POA: Diagnosis not present

## 2022-07-31 DIAGNOSIS — Z79899 Other long term (current) drug therapy: Secondary | ICD-10-CM | POA: Diagnosis not present

## 2022-07-31 DIAGNOSIS — M79672 Pain in left foot: Secondary | ICD-10-CM | POA: Diagnosis not present

## 2022-08-01 ENCOUNTER — Other Ambulatory Visit: Payer: Self-pay

## 2022-08-01 ENCOUNTER — Ambulatory Visit (HOSPITAL_COMMUNITY)
Admission: RE | Admit: 2022-08-01 | Discharge: 2022-08-01 | Disposition: A | Discharge status: Home / Self Care | Payer: Medicaid Other | Source: Ambulatory Visit

## 2022-08-01 ENCOUNTER — Ambulatory Visit: Payer: Medicaid Other | Admitting: Allergy & Immunology

## 2022-08-01 ENCOUNTER — Encounter: Payer: Self-pay | Admitting: Allergy & Immunology

## 2022-08-01 ENCOUNTER — Other Ambulatory Visit (HOSPITAL_COMMUNITY): Payer: Self-pay

## 2022-08-01 VITALS — BP 142/78 | HR 105 | Temp 98.1°F | Resp 18 | Wt 224.5 lb

## 2022-08-01 DIAGNOSIS — K219 Gastro-esophageal reflux disease without esophagitis: Secondary | ICD-10-CM | POA: Diagnosis not present

## 2022-08-01 DIAGNOSIS — M25541 Pain in joints of right hand: Secondary | ICD-10-CM | POA: Insufficient documentation

## 2022-08-01 DIAGNOSIS — J310 Chronic rhinitis: Secondary | ICD-10-CM | POA: Diagnosis not present

## 2022-08-01 DIAGNOSIS — J4520 Mild intermittent asthma, uncomplicated: Secondary | ICD-10-CM | POA: Diagnosis not present

## 2022-08-01 DIAGNOSIS — M25542 Pain in joints of left hand: Secondary | ICD-10-CM

## 2022-08-01 DIAGNOSIS — T7800XD Anaphylactic reaction due to unspecified food, subsequent encounter: Secondary | ICD-10-CM

## 2022-08-01 DIAGNOSIS — M1812 Unilateral primary osteoarthritis of first carpometacarpal joint, left hand: Secondary | ICD-10-CM | POA: Diagnosis not present

## 2022-08-01 DIAGNOSIS — M1811 Unilateral primary osteoarthritis of first carpometacarpal joint, right hand: Secondary | ICD-10-CM | POA: Diagnosis not present

## 2022-08-01 NOTE — Patient Instructions (Addendum)
1. Mild intermittent asthma, uncomplicated - Lung testing looks better today, so I am pretty happy where things are headed. - We will continue with the albuterol 4 puffs as needed for coughing and wheezing.  - We are not going to make any changes at this time.  - Continue with albuterol 4 puffs every 4-6 hours as needed.   - There is no need for a controller medication at this time.   2. Gastroesophageal reflux disease - Continue with pantoprazole '40mg'$  daily.  3. 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 - EpiPen refilled today.    4. Chronic rhinitis - Continue fluticasone one spray per nostril daily as needed.  - You can also use cetirizine '10mg'$  daily as needed.  5. Return in about 1 year (around 08/02/2023).   Please inform us of any Emergency Department visits, hospitalizations, or changes in symptoms. Call us before going to the ED for breathing or allergy symptoms since we might be able to fit you in for a sick visit. Feel free to contact us anytime with any questions, problems, or concerns.  It was a pleasure to see you again today!  Websites that have reliable patient information: 1. American Academy of Asthma, Allergy, and Immunology: www.aaaai.org 2. Food Allergy Research and Education (FARE): foodallergy.org 3. Mothers of Asthmatics: http://www.asthmacommunitynetwork.org 4. American College of Allergy, Asthma, and Immunology: www.acaai.org   COVID-19 Vaccine Information can be found at: ShippingScam.co.uk For questions related to vaccine distribution or appointments, please email [email or web address] or call [phone].     "Like" [place] on Facebook and Instagram for our latest updates!      Make sure you are registered to vote! If you have moved or changed any of your contact information, you will need to get this updated before voting!  In some cases, you MAY be able to register to vote  online: [email or web address]

## 2022-08-01 NOTE — Progress Notes (Signed)
FOLLOW UP  Date of Service/Encounter:  08/01/22   Assessment:   Mild intermittent asthma, uncomplicated - with worsening lung function today   Adverse food reaction (walnut, kiwi, seafood)    Gastroesophageal reflux disease - controlled with a pantoprazole   Chronic nonallergic rhinitis - with negative environmental IgE panel in 2019   Plan/Recommendations:   1. Mild intermittent asthma, uncomplicated - Lung testing looks better today, so I am pretty happy where things are headed. - We will continue with the albuterol 4 puffs as needed for coughing and wheezing.  - We are not going to make any changes at this time.  - Continue with albuterol 4 puffs every 4-6 hours as needed.   - There is no need for a controller medication at this time.   2. Gastroesophageal reflux disease - Continue with pantoprazole '40mg'$  daily.  3. 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 - EpiPen refilled today.    4. Chronic rhinitis - Continue fluticasone one spray per nostril daily as needed.  - You can also use cetirizine '10mg'$  daily as needed.  5. Return in about 1 year (around 08/02/2023).   Subjective:   Sandy Valencia is a 62 y.o. female presenting today for follow up of  Chief Complaint  Patient presents with   Follow-up    Has had a sinus infection and has a dry cough. She took a zpack and some cough medicine.     Sandy Valencia has a history of the following: Patient Active Problem List   Diagnosis Date Noted   Acute non-recurrent frontal sinusitis 07/20/2022   Lumbar radiculopathy 03/08/2022   Statin intolerance 01/19/2022   Encounter for general adult medical examination with abnormal findings 01/16/2022   Lumbar spondylosis 09/05/2021   Allergic rhinitis due to allergen 04/17/2021   Seborrheic keratosis 04/17/2021   Primary osteoarthritis involving multiple joints 12/02/2019   Long-term current use of opiate analgesic 09/08/2019   Chronic low back pain  09/07/2019   Neck pain 09/07/2019   Mild intermittent asthma without complication [id number]   Gastroesophageal reflux disease 05/21/2018   Hypothyroidism 04/22/2017   Mixed hyperlipidemia 12/31/2016   Type 2 diabetes mellitus with hyperglycemia ([place]) 05/18/2015   Essential hypertension 05/18/2015   Morbid obesity ([place]) 05/18/2015   Fatty liver 02/14/2015   Hiatal hernia    Dysphagia, pharyngoesophageal phase     History obtained from: chart review and patient.  Sandy Valencia is a 62 y.o. female presenting for a follow up visit. She was last seen in November 2023.  At that time, like testing looked a little bit lower.  We continue with albuterol as needed.  For her reflux, we continue with Protonix 40 mg daily.  She continues to avoid kiwi and walnuts.  For her rhinitis, we continue Flonase and cetirizine.  Since last visit, she has done well.  Asthma/Respiratory Symptom History: She has that her breathing has been under good control.  She has not needed to use her albuterol much at all.  She has not been on prednisone for breathing purposes.  She has not been to the hospital for her asthma.  Allergic Rhinitis Symptom History: She remains on her fluticasone as needed. She also has the cetirizine daily. She does have a lingering dry cough for her recent sinus infection. She got azithromycin for her sinus infection. She did not get prednisone.  She does not recall her last time that she got any antibiotics for anything, at least before this  dose of azithromycin.  Sandy Valencia is doing well in school.  Evidently he is thinking about changing schools.  Otherwise, there have been no changes to her past medical history, surgical history, family history, or social history.    Review of Systems  Constitutional: Negative.  Negative for chills, fever, malaise/fatigue and weight loss.  HENT: Negative.  Negative for congestion, ear discharge, ear pain and sore throat.   Eyes:  Negative for pain, discharge and  redness.  Respiratory:  Negative for cough, sputum production, shortness of breath, wheezing and stridor.   Cardiovascular: Negative.  Negative for chest pain and palpitations.  Gastrointestinal:  Negative for abdominal pain, constipation, diarrhea, heartburn, nausea and vomiting.  Skin: Negative.  Negative for itching and rash.  Neurological:  Negative for dizziness and headaches.  Endo/Heme/Allergies:  Positive for environmental allergies. Does not bruise/bleed easily.       Objective:   Blood pressure (!) 142/78, pulse (!) 105, temperature 98.1 F (36.7 C), resp. rate 18, weight 224 lb 8 oz (101.8 kg), SpO2 96 %. Body mass index is 36.24 kg/m.    Physical Exam Vitals reviewed.  Constitutional:      Appearance: She is well-developed.     Comments: Extremely talkative.  HENT:     Head: Normocephalic and atraumatic.     Right Ear: Tympanic membrane, ear canal and external ear normal.     Left Ear: Tympanic membrane, ear canal and external ear normal.     Nose: No nasal deformity, septal deviation, mucosal edema or rhinorrhea.     Right Turbinates: Enlarged, swollen and pale.     Left Turbinates: Enlarged, swollen and pale.     Right Sinus: No maxillary sinus tenderness or frontal sinus tenderness.     Left Sinus: No maxillary sinus tenderness or frontal sinus tenderness.     Mouth/Throat:     Lips: Pink.     Mouth: Mucous membranes are moist. Mucous membranes are not pale and not dry.     Pharynx: Uvula midline.     Comments: Cobblestoning present.  Eyes:     General: Lids are normal. Allergic shiner present.        Right eye: No discharge.        Left eye: No discharge.     Conjunctiva/sclera: Conjunctivae normal.     Right eye: Right conjunctiva is not injected. No chemosis.    Left eye: Left conjunctiva is not injected. No chemosis.    Pupils: Pupils are equal, round, and reactive to light.  Cardiovascular:     Rate and Rhythm: Normal rate and regular rhythm.      Heart sounds: Normal heart sounds.  Pulmonary:     Effort: Pulmonary effort is normal. No tachypnea, accessory muscle usage or respiratory distress.     Breath sounds: Normal breath sounds. No wheezing, rhonchi or rales.     Comments: Moving air well in all lung fields. Chest:     Chest wall: No tenderness.  Lymphadenopathy:     Cervical: No cervical adenopathy.  Skin:    Coloration: Skin is not pale.     Findings: No abrasion, erythema, petechiae or rash. Rash is not papular, urticarial or vesicular.  Neurological:     Mental Status: She is alert.  Psychiatric:        Behavior: Behavior is cooperative.      Diagnostic studies:    Spirometry: results abnormal (FEV1: 2.02/79%, FVC: 2.38/73%, FEV1/FVC: 85%).    Spirometry consistent with possible restrictive disease.  Allergy Studies: none       Salvatore Marvel, MD  [facility] [place] [facility] [place] [facility]

## 2022-08-02 ENCOUNTER — Encounter: Payer: Self-pay | Admitting: Radiology

## 2022-08-07 ENCOUNTER — Other Ambulatory Visit: Payer: Self-pay | Admitting: Internal Medicine

## 2022-08-13 ENCOUNTER — Encounter: Payer: Self-pay | Admitting: *Deleted

## 2022-08-14 ENCOUNTER — Other Ambulatory Visit: Payer: Self-pay | Admitting: Internal Medicine

## 2022-08-21 ENCOUNTER — Other Ambulatory Visit: Payer: Self-pay | Admitting: Nurse Practitioner

## 2022-08-21 ENCOUNTER — Other Ambulatory Visit: Payer: Self-pay | Admitting: Allergy & Immunology

## 2022-08-28 ENCOUNTER — Telehealth (INDEPENDENT_AMBULATORY_CARE_PROVIDER_SITE_OTHER): Payer: Self-pay | Admitting: *Deleted

## 2022-08-28 NOTE — Telephone Encounter (Signed)
Procedure: Colonoscopy  Height: 5'5 Weight: 226lbs       Have you had a colonoscopy before?  Yes, 09/23/17, Dr. Gala Romney  Do you have family history of colon cancer?  Yes, mother  Do you have a family history of polyps? yes  Previous colonoscopy with polyps removed? no  Do you have a history colorectal cancer?   no  Are you diabetic?  Yes, type 2  Do you have a prosthetic or mechanical heart valve? no  Do you have a pacemaker/defibrillator?   no  Have you had endocarditis/atrial fibrillation?  no  Do you use supplemental oxygen/CPAP?  no  Have you had joint replacement within the last 12 months?  no  Do you tend to be constipated or have to use laxatives?  no   Do you have history of alcohol use? If yes, how much and how often.  no  Do you have history or are you using drugs? If yes, what do are you  using?  no  Have you ever had a stroke/heart attack?  no  Have you ever had a heart or other vascular stent placed,?no  Do you take weight loss medication? Yes ozempic  female patients,: have you had a hysterectomy? yes                              are you post menopausal?  no                              do you still have your menstrual cycle? no    Date of last menstrual period?   Do you take any blood-thinning medications such as: (Plavix, aspirin, Coumadin, Aggrenox, Brilinta, Xarelto, Eliquis, Pradaxa, Savaysa or Effient)? no  If yes we need the name, milligram, dosage and who is prescribing doctor:               Current Outpatient Medications  Medication Sig Dispense Refill   Accu-Chek FastClix Lancets MISC USE TO TEST 4 TIMES DAILY. 102 each 0   Accu-Chek FastClix Lancets MISC USE TO TEST 4 TIMES DAILY. 102 each 0   Accu-Chek FastClix Lancets MISC USE TO TEST 4 TIMES DAILY. 102 each 0   Accu-Chek FastClix Lancets MISC USE TO TEST 4 TIMES DAILY. 102 each 0   ACCU-CHEK GUIDE test strip USE TO TEST 4 TIMES DAILY. 100 strip 0   albuterol (VENTOLIN HFA) 108 (90 Base)  MCG/ACT inhaler INHALE 4 PUFFS INTO THE LUNGS EVERY 6 HOURS AS NEEDED FOR WHEEZING OR SHORTNESS OF BREATH 18 g 0   aspirin EC 81 MG tablet Take 81 mg by mouth daily.     benzonatate (TESSALON) 200 MG capsule Take 1 capsule (200 mg total) by mouth 2 (two) times daily as needed for cough. 20 capsule 0   cetirizine (ZYRTEC) 10 MG tablet Take 1 tablet (10 mg total) by mouth daily as needed for allergies. 30 tablet 5   Cholecalciferol (VITAMIN D-3) 1000 UNITS CAPS Take 1,000 Units by mouth daily.      Continuous Blood Gluc Sensor (DEXCOM G6 SENSOR) MISC CHANGE SENSOR EVERY 10 DAYS AS DIRECTED. 3 each 2   Continuous Blood Gluc Transmit (DEXCOM G6 TRANSMITTER) MISC CHANGE TRANSMITTER EVERY 90 DAYS AS DIRECTED. 1 each 1   diclofenac Sodium (VOLTAREN) 1 % GEL APPLY 2 GRAMS TO AFFECTED AREAS 2 TIMES A DAY AS NEEDED 100 g  0   EPINEPHrine 0.3 mg/0.3 mL IJ SOAJ injection Inject 0.3 mg into the muscle as needed for anaphylaxis. 2 each 1   fluticasone (FLONASE) 50 MCG/ACT nasal spray Place 1 spray into both nostrils daily. 16 g 3   HYDROcodone-acetaminophen (NORCO) 7.5-325 MG tablet Take 1 tablet by mouth 2 times daily at 12 noon and 4 pm.     insulin aspart (NOVOLOG FLEXPEN) 100 UNIT/ML FlexPen Inject 20-26 Units into the skin 3 (three) times daily with meals. 60 mL 3   ketoconazole (NIZORAL) 2 % cream APPLY TO AFFECTED AREAS TWICE DAILY (Patient taking differently: Apply 1 Application topically 2 (two) times daily.) 60 g 2   LANTUS SOLOSTAR 100 UNIT/ML Solostar Pen Inject 60 Units into the skin at bedtime. 60 mL 3   levothyroxine (SYNTHROID) 75 MCG tablet TAKE ONE (1) TABLET BY MOUTH EVERY DAY BEFORE BREAKFAST 90 tablet 0   lisinopril (ZESTRIL) 40 MG tablet Take 1 tablet (40 mg total) by mouth daily. 90 tablet 3   LITETOUCH PEN NEEDLES 31G X 8 MM MISC USE AS DIRECTED FOUR TIMES DAILY. 100 each 0   metFORMIN (GLUCOPHAGE-XR) 500 MG 24 hr tablet TAKE ONE TABLET BY MOUTH TWICE A DAY AFTER A MEAL 180 tablet 0    OZEMPIC, 1 MG/DOSE, 4 MG/3ML SOPN INJECT 1MG  ONCE A WEEK AS DIRECTED 9 mL 0   pantoprazole (PROTONIX) 40 MG tablet Take 1 tablet (40 mg total) by mouth daily. 30 tablet 11   pregabalin (LYRICA) 100 MG capsule Take 100 mg by mouth 2 (two) times daily.     vitamin B-12 (CYANOCOBALAMIN) 500 MCG tablet Take 500 mcg by mouth daily.     No current facility-administered medications for this visit.    Allergies  Allergen Reactions   Kiwi Extract Anaphylaxis, Swelling and Palpitations   Other Anaphylaxis    Walnuts (avoids all tree nuts)   Statins     Transaminase elevation

## 2022-09-04 DIAGNOSIS — M545 Low back pain, unspecified: Secondary | ICD-10-CM | POA: Diagnosis not present

## 2022-09-04 DIAGNOSIS — M5116 Intervertebral disc disorders with radiculopathy, lumbar region: Secondary | ICD-10-CM | POA: Diagnosis not present

## 2022-09-04 DIAGNOSIS — M25549 Pain in joints of unspecified hand: Secondary | ICD-10-CM | POA: Diagnosis not present

## 2022-09-04 DIAGNOSIS — G894 Chronic pain syndrome: Secondary | ICD-10-CM | POA: Diagnosis not present

## 2022-09-17 ENCOUNTER — Other Ambulatory Visit: Payer: Self-pay

## 2022-09-17 ENCOUNTER — Emergency Department (HOSPITAL_COMMUNITY)
Admission: EM | Admit: 2022-09-17 | Discharge: 2022-09-17 | Disposition: A | Discharge status: Home / Self Care | Payer: Medicaid Other | Attending: Emergency Medicine | Admitting: Emergency Medicine

## 2022-09-17 ENCOUNTER — Encounter (HOSPITAL_COMMUNITY): Payer: Self-pay | Admitting: *Deleted

## 2022-09-17 DIAGNOSIS — Z7984 Long term (current) use of oral hypoglycemic drugs: Secondary | ICD-10-CM | POA: Diagnosis not present

## 2022-09-17 DIAGNOSIS — Z7982 Long term (current) use of aspirin: Secondary | ICD-10-CM | POA: Diagnosis not present

## 2022-09-17 DIAGNOSIS — E1165 Type 2 diabetes mellitus with hyperglycemia: Secondary | ICD-10-CM | POA: Insufficient documentation

## 2022-09-17 DIAGNOSIS — R112 Nausea with vomiting, unspecified: Secondary | ICD-10-CM | POA: Diagnosis present

## 2022-09-17 DIAGNOSIS — K529 Noninfective gastroenteritis and colitis, unspecified: Secondary | ICD-10-CM

## 2022-09-17 DIAGNOSIS — Z794 Long term (current) use of insulin: Secondary | ICD-10-CM | POA: Insufficient documentation

## 2022-09-17 DIAGNOSIS — R739 Hyperglycemia, unspecified: Secondary | ICD-10-CM

## 2022-09-17 LAB — CBC
HCT: 44.4 % (ref 36.0–46.0)
Hemoglobin: 14.5 g/dL (ref 12.0–15.0)
MCH: 28.5 pg (ref 26.0–34.0)
MCHC: 32.7 g/dL (ref 30.0–36.0)
MCV: 87.2 fL (ref 80.0–100.0)
Platelets: 237 10*3/uL (ref 150–400)
RBC: 5.09 MIL/uL (ref 3.87–5.11)
RDW: 14.2 % (ref 11.5–15.5)
WBC: 8 10*3/uL (ref 4.0–10.5)
nRBC: 0 % (ref 0.0–0.2)

## 2022-09-17 LAB — COMPREHENSIVE METABOLIC PANEL
ALT: 40 U/L (ref 0–44)
AST: 28 U/L (ref 15–41)
Albumin: 4.2 g/dL (ref 3.5–5.0)
Alkaline Phosphatase: 68 U/L (ref 38–126)
Anion gap: 10 (ref 5–15)
BUN: 16 mg/dL (ref 8–23)
CO2: 22 mmol/L (ref 22–32)
Calcium: 9.3 mg/dL (ref 8.9–10.3)
Chloride: 102 mmol/L (ref 98–111)
Creatinine, Ser: 0.79 mg/dL (ref 0.44–1.00)
GFR, Estimated: >60 mL/min (ref >60)
Glucose, Bld: 305 mg/dL — ABNORMAL HIGH (ref 70–99)
Potassium: 4.2 mmol/L (ref 3.5–5.1)
Sodium: 134 mmol/L — ABNORMAL LOW (ref 135–145)
Total Bilirubin: 0.8 mg/dL (ref 0.3–1.2)
Total Protein: 8.1 g/dL (ref 6.5–8.1)

## 2022-09-17 LAB — LIPASE, BLOOD: Lipase: 47 U/L (ref 11–51)

## 2022-09-17 LAB — CBG MONITORING, ED: Glucose-Capillary: 243 mg/dL — ABNORMAL HIGH (ref 70–99)

## 2022-09-17 MED ORDER — DEXTROSE IN LACTATED RINGERS 5 % IV SOLN: INTRAVENOUS | Status: DC

## 2022-09-17 MED ORDER — ONDANSETRON HCL 4 MG PO TABS: 4.0000 mg | ORAL_TABLET | Freq: Four times a day (QID) | ORAL | 0 refills | Status: DC

## 2022-09-17 MED ORDER — INSULIN REGULAR(HUMAN) IN NACL 100-0.9 UT/100ML-% IV SOLN: INTRAVENOUS | Status: DC

## 2022-09-17 MED ORDER — ONDANSETRON HCL 4 MG/2ML IJ SOLN
4.0000 mg | Freq: Once | INTRAMUSCULAR | Status: AC
  Administered 2022-09-17: 4 mg via INTRAVENOUS
  Filled 2022-09-17: qty 2

## 2022-09-17 MED ORDER — LACTATED RINGERS IV SOLN: INTRAVENOUS | Status: DC

## 2022-09-17 MED ORDER — SODIUM CHLORIDE 0.9 % IV BOLUS
1000.0000 mL | Freq: Once | INTRAVENOUS | Status: AC
  Administered 2022-09-17: 1000 mL via INTRAVENOUS

## 2022-09-17 MED ORDER — DEXTROSE 50 % IV SOLN: 0.0000 mL | INTRAVENOUS | Status: DC | PRN

## 2022-09-17 NOTE — ED Triage Notes (Signed)
Pt with emesis and diarrhea since yesterday.

## 2022-09-17 NOTE — ED Notes (Signed)
Pt not in waiting area x 1 

## 2022-09-17 NOTE — Discharge Instructions (Addendum)
Please pick up the Zofran I prescribed for you for your nausea and continue taking fluids and food as tolerated.  Please follow-up with your primary care provider regarding recent symptoms and ER visit.  Please continue to take your diabetes medication as prescribed as your sugar was elevated today however did drop after receiving fluids to a reasonable level.  If symptoms worsen please return to ER.

## 2022-09-17 NOTE — ED Provider Notes (Signed)
EMERGENCY DEPARTMENT AT [facility] Provider Note   CSN: [id number] Arrival date & time: 09/17/22  1203     History  Chief Complaint  Patient presents with   Emesis    Sandy Valencia is a 62 y.o. female history of type 2 diabetes, GERD presented with nausea vomiting this been present for 1 day.  Patient states she has been unable to take her medications including her insulin due to not be able to keep food or fluids down.  Patient states her sugars are normal in the 130s however upon arrival her sugar was in the 300s.  Patient states she feels generalized weakness but denies any sick contacts or fever/chills.  Patient also states she has been having diarrhea since yesterday as well as nonbloody.  Patient denied any recent hospitalizations or antibiotics or any recent travel.  Patient denied chest pain, shortness of breath, change in sensation/motor skills, dysuria, cough, vision changes, recent travel or antibiotics  Home Medications Prior to Admission medications   Medication Sig Start Date End Date Taking? Authorizing Provider  ondansetron (ZOFRAN) 4 MG tablet Take 1 tablet (4 mg total) by mouth every 6 (six) hours. 09/17/22  Yes Dannia Snook, Fayrene Fearing T, PA-C  Accu-Chek FastClix Lancets MISC USE TO TEST 4 TIMES DAILY. 05/10/22   Anabel Halon, MD  Accu-Chek FastClix Lancets MISC USE TO TEST 4 TIMES DAILY. 05/10/22   Anabel Halon, MD  Accu-Chek FastClix Lancets MISC USE TO TEST 4 TIMES DAILY. 08/14/22   Anabel Halon, MD  Accu-Chek FastClix Lancets MISC USE TO TEST 4 TIMES DAILY. 08/14/22   Anabel Halon, MD  ACCU-CHEK GUIDE test strip USE TO TEST 4 TIMES DAILY. 08/14/22   Anabel Halon, MD  albuterol (VENTOLIN HFA) 108 (90 Base) MCG/ACT inhaler INHALE 4 PUFFS INTO THE LUNGS EVERY 6 HOURS AS NEEDED FOR WHEEZING OR SHORTNESS OF BREATH 08/21/22   Alfonse Spruce, MD  aspirin EC 81 MG tablet Take 81 mg by mouth daily.    [provider]  benzonatate  (TESSALON) 200 MG capsule Take 1 capsule (200 mg total) by mouth 2 (two) times daily as needed for cough. 07/20/22   Anabel Halon, MD  cetirizine (ZYRTEC) 10 MG tablet Take 1 tablet (10 mg total) by mouth daily as needed for allergies. 05/22/22   Alfonse Spruce, MD  Cholecalciferol (VITAMIN D-3) 1000 UNITS CAPS Take 1,000 Units by mouth daily.     [provider]  Continuous Blood Gluc Sensor (DEXCOM G6 SENSOR) MISC CHANGE SENSOR EVERY 10 DAYS AS DIRECTED. 08/21/22   Dani Gobble, NP  Continuous Blood Gluc Transmit (DEXCOM G6 TRANSMITTER) MISC CHANGE TRANSMITTER EVERY 90 DAYS AS DIRECTED. 06/13/22   Dani Gobble, NP  diclofenac Sodium (VOLTAREN) 1 % GEL APPLY 2 GRAMS TO AFFECTED AREAS 2 TIMES A DAY AS NEEDED 08/07/22   Anabel Halon, MD  EPINEPHrine 0.3 mg/0.3 mL IJ SOAJ injection Inject 0.3 mg into the muscle as needed for anaphylaxis. 04/25/22   Alfonse Spruce, MD  fluticasone [facility]) 50 MCG/ACT nasal spray Place 1 spray into both nostrils daily. 10/12/20   Alfonse Spruce, MD  HYDROcodone-acetaminophen [facility]) 7.5-325 MG tablet Take 1 tablet by mouth 2 times daily at 12 noon and 4 pm. 06/28/20   [provider]  insulin aspart (NOVOLOG FLEXPEN) 100 UNIT/ML FlexPen Inject 20-26 Units into the skin 3 (three) times daily with meals. 02/20/22   Dani Gobble, NP  ketoconazole (NIZORAL) 2 % cream APPLY TO AFFECTED AREAS TWICE DAILY Patient taking differently: Apply 1 Application topically 2 (two) times daily. 02/19/22   Anabel Halon, MD  LANTUS SOLOSTAR 100 UNIT/ML Solostar Pen Inject 60 Units into the skin at bedtime. 02/20/22   Dani Gobble, NP  levothyroxine (SYNTHROID) 75 MCG tablet TAKE ONE (1) TABLET BY MOUTH EVERY DAY BEFORE BREAKFAST 08/21/22   Dani Gobble, NP  lisinopril (ZESTRIL) 40 MG tablet Take 1 tablet (40 mg total) by mouth daily. 07/20/22   Anabel Halon, MD  LITETOUCH PEN NEEDLES 31G X 8 MM MISC USE AS DIRECTED FOUR  TIMES DAILY. 08/14/22   Anabel Halon, MD  metFORMIN (GLUCOPHAGE-XR) 500 MG 24 hr tablet TAKE ONE TABLET BY MOUTH TWICE A DAY AFTER A MEAL 08/21/22   Reardon, Freddi Starr, NP  OZEMPIC, 1 MG/DOSE, 4 MG/3ML SOPN INJECT 1MG  ONCE A WEEK AS DIRECTED 06/21/22   Dani Gobble, NP  pantoprazole (PROTONIX) 40 MG tablet Take 1 tablet (40 mg total) by mouth daily. 04/25/22   Alfonse Spruce, MD  pregabalin (LYRICA) 100 MG capsule Take 100 mg by mouth 2 (two) times daily. 04/20/22   [provider]  vitamin B-12 (CYANOCOBALAMIN) 500 MCG tablet Take 500 mcg by mouth daily.    [provider]      Allergies    Kiwi extract, Other, and Statins    Review of Systems   Review of Systems  Gastrointestinal:  Positive for vomiting.  See HPI  Physical Exam Updated Vital Signs BP 125/70 (BP Location: Right Arm)   Pulse 94   Temp 98.3 F (36.8 C) (Oral)   Resp 18   Ht 5\' 5"  (1.651 m)   Wt 102.5 kg   SpO2 96%   BMI 37.61 kg/m  Physical Exam Vitals reviewed.  Constitutional:      General: She is not in acute distress. HENT:     Head: Normocephalic and atraumatic.  Eyes:     Extraocular Movements: Extraocular movements intact.     Conjunctiva/sclera: Conjunctivae normal.     Pupils: Pupils are equal, round, and reactive to light.  Cardiovascular:     Rate and Rhythm: Normal rate and regular rhythm.     Pulses: Normal pulses.     Heart sounds: Normal heart sounds.     Comments: 2+ bilateral radial/dorsalis pedis pulses with regular rate Pulmonary:     Effort: Pulmonary effort is normal. No respiratory distress.     Breath sounds: Normal breath sounds.  Abdominal:     Palpations: Abdomen is soft.     Tenderness: There is no abdominal tenderness. There is no guarding or rebound.  Musculoskeletal:        General: Normal range of motion.     Cervical back: Normal range of motion and neck supple.     Comments: 5 out of 5 bilateral grip/leg extension strength  Skin:     General: Skin is warm and dry.     Capillary Refill: Capillary refill takes less than 2 seconds.  Neurological:     General: No focal deficit present.     Mental Status: She is alert and oriented to person, place, and time.     Comments: Sensation intact in all 4 limbs  Psychiatric:        Mood and Affect: Mood normal.     ED Results / Procedures / Treatments   Labs (all labs ordered are listed, but only abnormal results  are displayed) Labs Reviewed  COMPREHENSIVE METABOLIC PANEL - Abnormal; Notable for the following components:      Result Value   Sodium 134 (*)    Glucose, Bld 305 (*)    All other components within normal limits  CBG MONITORING, ED - Abnormal; Notable for the following components:   Glucose-Capillary 243 (*)    All other components within normal limits  LIPASE, BLOOD  CBC  URINALYSIS, ROUTINE W REFLEX MICROSCOPIC    EKG None  Radiology No results found.  Procedures Procedures    Medications Ordered in ED Medications  sodium chloride 0.9 % bolus 1,000 mL (0 mLs Intravenous Stopped 09/17/22 1920)  ondansetron (ZOFRAN) injection 4 mg (4 mg Intravenous Given 09/17/22 1748)    ED Course/ Medical Decision Making/ A&P                             Medical Decision Making Amount and/or Complexity of Data Reviewed Labs: ordered.  Risk Prescription drug management.   Zabrina W Behlke 62 y.o. presented today for emesis and diarrhea. Working DDx that I considered at this time includes, but not limited to, gastroenteritis, electrolyte abnormalities, dehydration, DKA/HHS.  R/o DDx: DKA/HHS, electrolyte abnormalities: These are considered less likely due to history of present illness and physical exam findings  Review of prior external notes: 04/16/2022 ED  Unique Tests and My Interpretation:  CMP: Glucose 305 Lipase: 47 CBG: 243 CBC: Unremarkable  Discussion with Independent Historian: None  Discussion of Management of Tests:  None  Risk: Medium: prescription drug management  Risk Stratification Score: None  Plan: Patient presented for weakness. On exam patient was in no acute distress and stable vitals.  Patient stated she had been experiencing nausea/vomiting/diarrhea which she suspects is causing her weakness as she has not been to keep anything down.  Patient's glucose was also 305 upon arrival as she has not been able to take her insulin.  Labs were ordered along with fluids and patient's sugars improved to 243.  Patient did not have an anion gap and so a VBG was not ordered at this time.  The rest of patient's labs were reassuring and after receiving Zofran patient was able to tolerate fluids p.o.  Patient verbalized that she wanted to go home and that she can take her insulin at home now that she could eat and drink.  I spoke to the patient about possibly receiving more fluid here to lessen her sugar little bit more however patient states she felt comfortable with her sugar being at 243 and that she could manage it at home.  We spoke about the dangers of having her sugars run high and patient verbalized understanding.  I encouraged patient follow-up with her primary care provider and will prescribe Zofran for the nausea and spoke to her about Imodium for her diarrhea.  I suspect patient has a viral gastroenteritis that in turn caused her sugar to be high due to not be able to take her medications.  Patient was given return precautions. Patient stable for discharge at this time.  Patient verbalized understanding of plan.        Final Clinical Impression(s) / ED Diagnoses Final diagnoses:  Hyperglycemia  Gastroenteritis    Rx / DC Orders ED Discharge Orders          Ordered    ondansetron (ZOFRAN) 4 MG tablet  Every 6 hours        09/17/22 2007  Remi Deter 09/17/22 2012    Terrilee Files, MD 09/18/22 1011

## 2022-09-18 ENCOUNTER — Ambulatory Visit: Payer: Medicaid Other | Admitting: Internal Medicine

## 2022-09-18 ENCOUNTER — Encounter: Payer: Self-pay | Admitting: Internal Medicine

## 2022-09-18 ENCOUNTER — Other Ambulatory Visit: Payer: Self-pay | Admitting: Nurse Practitioner

## 2022-09-18 VITALS — BP 134/70 | HR 91 | Ht 66.0 in | Wt 220.4 lb

## 2022-09-18 DIAGNOSIS — Z794 Long term (current) use of insulin: Secondary | ICD-10-CM | POA: Diagnosis not present

## 2022-09-18 DIAGNOSIS — E782 Mixed hyperlipidemia: Secondary | ICD-10-CM

## 2022-09-18 DIAGNOSIS — E039 Hypothyroidism, unspecified: Secondary | ICD-10-CM

## 2022-09-18 DIAGNOSIS — Z09 Encounter for follow-up examination after completed treatment for conditions other than malignant neoplasm: Secondary | ICD-10-CM | POA: Diagnosis not present

## 2022-09-18 DIAGNOSIS — E559 Vitamin D deficiency, unspecified: Secondary | ICD-10-CM

## 2022-09-18 DIAGNOSIS — E1165 Type 2 diabetes mellitus with hyperglycemia: Secondary | ICD-10-CM

## 2022-09-18 DIAGNOSIS — I10 Essential (primary) hypertension: Secondary | ICD-10-CM | POA: Diagnosis not present

## 2022-09-18 DIAGNOSIS — F5101 Primary insomnia: Secondary | ICD-10-CM | POA: Diagnosis not present

## 2022-09-18 NOTE — Assessment & Plan Note (Signed)
Lab Results  Component Value Date   HGBA1C 8.6 (A) 06/22/2022   Uncontrolled Follows up with Endocrinology On Lantus 60 units and NovoLog ISS Continue metformin and Ozempic Tried to give her statin in the past, had transaminitis -will try again to add statin later Takes Lyrica for neuropathy/low back pain 

## 2022-09-18 NOTE — Assessment & Plan Note (Signed)
Diet modification and moderate exercise advised °On Ozempic for type II DM °

## 2022-09-18 NOTE — Assessment & Plan Note (Signed)
ER chart reviewed, including blood tests Was given IV fluids and Zofran for acute gastroenteritis-same symptoms resolved now Advised to maintain adequate hydration

## 2022-09-18 NOTE — Progress Notes (Signed)
Established Patient Office Visit  Subjective:  Patient ID: Sandy Valencia, female    DOB: Oct 05, 1960  Age: 62 y.o. MRN: [id number]  CC:  Chief Complaint  Patient presents with   Hypertension    Two month follow up. Patient states she has trouble sleeping     HPI Sandy Valencia is a 62 y.o. female with past medical history of HTN, DM, hypothyroidism, NAFLD, GERD, chronic pain syndrome and morbid obesity who presents for follow up of her chronic medical conditions.  HTN: Her BP was wnl today.  She is on lisinopril 40 mg QD. She denies any headache, dizziness, chest pain, dyspnea or palpitations.  Type II DM: Followed by endocrinology.  She has been taking Ozempic.  She takes Lantus 60 units nightly and takes NovoLog ISS in addition to metformin.  Her blood glucose has been ranging around 150-200 most of the time, with morning readings above 200. She has Dexcom currently.  Insomnia: She reports chronic insomnia.  She admits that she uses phone in her bed to help her fall asleep.  Denies anhedonia, SI or HI currently.  She wants to try taking melatonin.  After discussion, she agrees to avoid using electronic devices in her bedroom.   She went to ER yesterday for nausea, vomiting and watery diarrhea.  She was given IV fluids and Zofran.  She denies any fever, chills.  Her vomiting and diarrhea have resolved now.  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   Anaphylactic shock, unspecified, subsequent encounter    Anemia, iron deficiency 02/14/2015   Angio-edema    Arthritis    Asthma    Asymptomatic varicose veins of right lower extremity    Chronic obstructive pulmonary disease with (acute) exacerbation    Chronic rhinitis 05/21/2018   COVID-19    Diabetes mellitus    Essential (primary) hypertension    Family history of colon cancer 09/03/2012   Fatty liver    Gastro-esophageal reflux disease  without esophagitis    GERD (gastroesophageal reflux disease)    History of colonic polyps 08/26/2017   Hypercholesteremia    Hypertension    Hypothyroidism    Low back pain    Mass of finger, right s/p surgical excision (mucoid cyst) 04/17/18 04/22/2018   Mucous cyst of digit of right hand    Obesity, unspecified    Other adverse food reactions, not elsewhere classified, initial encounter    Personal history of noncompliance with medical treatment, presenting hazards to health 05/18/2015   Rash and other nonspecific skin eruption    Sleep apnea    pt siad, "i had small amount but could not tolerate CPAP and they said it was ok since it was mild.   Sleep apnea, unspecified    Unspecified asthma, uncomplicated    Viral URI 07/13/2022   Zoster without complications     Past Surgical History:  Procedure Laterality Date   ABDOMINAL HYSTERECTOMY     CATARACT EXTRACTION W/PHACO Left 03/09/2022   Procedure: CATARACT EXTRACTION PHACO AND INTRAOCULAR LENS PLACEMENT (IOC);  Surgeon: Fabio Pierce, MD;  Location: [facility];  Service: Ophthalmology;  Laterality: Left;  CDE 7.89   CATARACT EXTRACTION W/PHACO Right 03/23/2022   Procedure: CATARACT EXTRACTION PHACO AND INTRAOCULAR LENS PLACEMENT (IOC);  Surgeon: Fabio Pierce, MD;  Location: [facility];  Service: Ophthalmology;  Laterality: Right;  CDE: 2.63   CHOLECYSTECTOMY  COLONOSCOPY N/A 09/22/2012   RMR: colonic polyps -removed as described above. tubular adenoma, next TCS 09/2017   COLONOSCOPY WITH PROPOFOL N/A 09/23/2017   Procedure: COLONOSCOPY WITH PROPOFOL;  Surgeon: Corbin Ade, MD;  Location: [facility];  Service: Endoscopy;  Laterality: N/A;  7:30am   CYST EXCISION Left 07/19/2016   Procedure: CYST REMOVAL LEFT RING FINGER;  Surgeon: Vickki Hearing, MD;  Location: [facility];  Service: Orthopedics;  Laterality: Left;   CYST REMOVAL LEG Right    foot   ESOPHAGOGASTRODUODENOSCOPY N/A 10/21/2014   RMR: Small hiatal hernia;  otherwise normal EGD status post passage of a Maloney dilator.    EXCISION MASS UPPER EXTREMETIES Right 04/17/2018   Procedure: EXCISION MASS UPPER EXTREMETIES right ring finger;  Surgeon: Vickki Hearing, MD;  Location: [facility];  Service: Orthopedics;  Laterality: Right;   MALONEY DILATION N/A 10/21/2014   Procedure: Elease Hashimoto DILATION;  Surgeon: Corbin Ade, MD;  Location: [facility];  Service: Endoscopy;  Laterality: N/A;   MIDDLE EAR SURGERY Right    patched hole in ear drum   POLYPECTOMY  09/23/2017   Procedure: POLYPECTOMY;  Surgeon: Corbin Ade, MD;  Location: [facility];  Service: Endoscopy;;  polyp hepatic flexure polyp cs, splenic flexure polyp cs, rectal polyp cs   TOTAL ABDOMINAL HYSTERECTOMY      Family History  Problem Relation Age of Onset   Colon cancer Mother        diagnosed age 51, underwent surgical resection, metastatic disease several years later   Diabetes Mother    Heart attack Father    Diabetes Sister    Hypothyroidism Brother    Diabetes Sister    Diabetes Sister    Diabetes Sister    Hypothyroidism Sister    Allergic rhinitis Neg Hx    Angioedema Neg Hx    Asthma Neg Hx    Atopy Neg Hx    Eczema Neg Hx    Immunodeficiency Neg Hx    Urticaria Neg Hx     Social History   Socioeconomic History   Marital status: Divorced    Spouse name: Not on file   Number of children: 2   Years of education: Not on file   Highest education level: Not on file  Occupational History   Not on file  Tobacco Use   Smoking status: Never   Smokeless tobacco: Never  Vaping Use   Vaping Use: Never used  Substance and Sexual Activity   Alcohol use: No    Alcohol/week: 0.0 standard drinks of alcohol   Drug use: No   Sexual activity: Yes    Birth control/protection: Surgical  Other Topics Concern   Not on file  Social History Narrative   Lives with grandson (since born)      Enjoys: vacation, travel, concerts      Diet: eats all food groups     Caffeine: diet Pepsi daily    Water:  8 cups daily       Wears seat belt    Does use phone   Smoke Retail buyer in safe area    Social Determinants of Health   Financial Resource Strain: Low Risk  (12/02/2019)   Overall Financial Resource Strain (CARDIA)    Difficulty of Paying Living Expenses: Not hard at all  Food Insecurity: No Food Insecurity (12/02/2019)   Hunger Vital Sign    Worried About Running Out of Food in the Last Year: Never  true    Ran Out of Food in the Last Year: Never true  Transportation Needs: No Transportation Needs (12/02/2019)   PRAPARE - Administrator, Civil Service (Medical): No    Lack of Transportation (Non-Medical): No  Physical Activity: Insufficiently Active (12/02/2019)   Exercise Vital Sign    Days of Exercise per Week: 2 days    Minutes of Exercise per Session: 20 min  Stress: Not on file  Social Connections: Moderately Isolated (12/02/2019)   Social Connection and Isolation Panel [NHANES]    Frequency of Communication with Friends and Family: More than three times a week    Frequency of Social Gatherings with Friends and Family: More than three times a week    Attends Religious Services: More than 4 times per year    Active Member of [employer] or Organizations: No    Attends Banker Meetings: Never    Marital Status: Divorced  Catering manager Violence: Not At Risk (12/02/2019)   Humiliation, Afraid, Rape, and Kick questionnaire    Fear of Current or Ex-Partner: No    Emotionally Abused: No    Physically Abused: No    Sexually Abused: No    Outpatient Medications Prior to Visit  Medication Sig Dispense Refill   Accu-Chek FastClix Lancets MISC USE TO TEST 4 TIMES DAILY. 102 each 0   Accu-Chek FastClix Lancets MISC USE TO TEST 4 TIMES DAILY. 102 each 0   Accu-Chek FastClix Lancets MISC USE TO TEST 4 TIMES DAILY. 102 each 0   Accu-Chek FastClix Lancets MISC USE TO TEST 4 TIMES DAILY. 102 each 0   ACCU-CHEK GUIDE  test strip USE TO TEST 4 TIMES DAILY. 100 strip 0   albuterol (VENTOLIN HFA) 108 (90 Base) MCG/ACT inhaler INHALE 4 PUFFS INTO THE LUNGS EVERY 6 HOURS AS NEEDED FOR WHEEZING OR SHORTNESS OF BREATH 18 g 0   aspirin EC 81 MG tablet Take 81 mg by mouth daily.     benzonatate (TESSALON) 200 MG capsule Take 1 capsule (200 mg total) by mouth 2 (two) times daily as needed for cough. 20 capsule 0   cetirizine (ZYRTEC) 10 MG tablet Take 1 tablet (10 mg total) by mouth daily as needed for allergies. 30 tablet 5   Cholecalciferol (VITAMIN D-3) 1000 UNITS CAPS Take 1,000 Units by mouth daily.      Continuous Blood Gluc Sensor (DEXCOM G6 SENSOR) MISC CHANGE SENSOR EVERY 10 DAYS AS DIRECTED. 3 each 2   Continuous Glucose Transmitter (DEXCOM G6 TRANSMITTER) MISC CHANGE TRANSMITTER EVERY 90 DAYS AS DIRECTED. 1 each 0   diclofenac Sodium (VOLTAREN) 1 % GEL APPLY 2 GRAMS TO AFFECTED AREAS 2 TIMES A DAY AS NEEDED 100 g 0   EPINEPHrine 0.3 mg/0.3 mL IJ SOAJ injection Inject 0.3 mg into the muscle as needed for anaphylaxis. 2 each 1   fluticasone (FLONASE) 50 MCG/ACT nasal spray Place 1 spray into both nostrils daily. 16 g 3   HYDROcodone-acetaminophen (NORCO) 7.5-325 MG tablet Take 1 tablet by mouth 2 times daily at 12 noon and 4 pm.     insulin aspart (NOVOLOG FLEXPEN) 100 UNIT/ML FlexPen Inject 20-26 Units into the skin 3 (three) times daily with meals. 60 mL 3   ketoconazole (NIZORAL) 2 % cream APPLY TO AFFECTED AREAS TWICE DAILY (Patient taking differently: Apply 1 Application topically 2 (two) times daily.) 60 g 2   LANTUS SOLOSTAR 100 UNIT/ML Solostar Pen Inject 60 Units into the skin at bedtime. 60 mL 3  levothyroxine (SYNTHROID) 75 MCG tablet TAKE ONE (1) TABLET BY MOUTH EVERY DAY BEFORE BREAKFAST 90 tablet 0   lisinopril (ZESTRIL) 40 MG tablet Take 1 tablet (40 mg total) by mouth daily. 90 tablet 3   LITETOUCH PEN NEEDLES 31G X 8 MM MISC USE AS DIRECTED FOUR TIMES DAILY. 100 each 0   metFORMIN  (GLUCOPHAGE-XR) 500 MG 24 hr tablet TAKE ONE TABLET BY MOUTH TWICE A DAY AFTER A MEAL 180 tablet 0   ondansetron (ZOFRAN) 4 MG tablet Take 1 tablet (4 mg total) by mouth every 6 (six) hours. 12 tablet 0   OZEMPIC, 1 MG/DOSE, 4 MG/3ML SOPN INJECT 1MG  ONCE A WEEK AS DIRECTED 9 mL 0   pantoprazole (PROTONIX) 40 MG tablet Take 1 tablet (40 mg total) by mouth daily. 30 tablet 11   pregabalin (LYRICA) 100 MG capsule Take 100 mg by mouth 2 (two) times daily.     vitamin B-12 (CYANOCOBALAMIN) 500 MCG tablet Take 500 mcg by mouth daily.     No facility-administered medications prior to visit.    Allergies  Allergen Reactions   Kiwi Extract Anaphylaxis, Swelling and Palpitations   Other Anaphylaxis    Walnuts (avoids all tree nuts)   Statins     Transaminase elevation    ROS Review of Systems  Constitutional:  Negative for chills and fever.  HENT:  Negative for congestion, sinus pressure and sinus pain.   Eyes:  Negative for pain and discharge.  Respiratory:  Negative for cough and shortness of breath.   Cardiovascular:  Negative for chest pain and palpitations.  Gastrointestinal:  Negative for abdominal pain, diarrhea, nausea and vomiting.  Endocrine: Negative for polydipsia and polyuria.  Genitourinary:  Negative for dysuria and hematuria.  Musculoskeletal:  Positive for arthralgias and back pain. Negative for neck pain and neck stiffness.  Skin:  Positive for color change (Mole over back).  Neurological:  Negative for dizziness and weakness.  Psychiatric/Behavioral:  Positive for sleep disturbance. Negative for agitation and behavioral problems.       Objective:    Physical Exam Vitals reviewed.  Constitutional:      General: She is not in acute distress.    Appearance: She is obese. She is not diaphoretic.  HENT:     Head: Normocephalic and atraumatic.     Nose: No congestion.     Right Sinus: Frontal sinus tenderness present.     Left Sinus: Frontal sinus tenderness present.      Mouth/Throat:     Mouth: Mucous membranes are moist.     Pharynx: No posterior oropharyngeal erythema.  Eyes:     General: No scleral icterus.    Extraocular Movements: Extraocular movements intact.  Cardiovascular:     Rate and Rhythm: Normal rate and regular rhythm.     Pulses: Normal pulses.     Heart sounds: Normal heart sounds. No murmur heard. Pulmonary:     Breath sounds: Normal breath sounds. No wheezing or rales.  Abdominal:     Palpations: Abdomen is soft.     Tenderness: There is no abdominal tenderness.  Musculoskeletal:     Cervical back: Neck supple. No tenderness.     Right lower leg: No edema.     Left lower leg: No edema.  Skin:    General: Skin is warm.  Neurological:     General: No focal deficit present.     Mental Status: She is alert and oriented to person, place, and time.  Psychiatric:  Mood and Affect: Mood normal.        Behavior: Behavior normal.     BP 134/70 (BP Location: Right Arm)   Pulse 91   Ht  (1.676 m)   Wt 220 lb 6.4 oz (100 kg)   SpO2 95%   BMI 35.57 kg/m  Wt Readings from Last 3 Encounters:  09/18/22 220 lb 6.4 oz (100 kg)  09/17/22 226 lb (102.5 kg)  08/01/22 224 lb 8 oz (101.8 kg)    Lab Results  Component Value Date   TSH 1.050 01/16/2022   Lab Results  Component Value Date   WBC 8.0 09/17/2022   HGB 14.5 09/17/2022   HCT 44.4 09/17/2022   MCV 87.2 09/17/2022   PLT 237 09/17/2022   Lab Results  Component Value Date   NA 134 (L) 09/17/2022   K 4.2 09/17/2022   CO2 22 09/17/2022   GLUCOSE 305 (H) 09/17/2022   BUN 16 09/17/2022   CREATININE 0.79 09/17/2022   BILITOT 0.8 09/17/2022   ALKPHOS 68 09/17/2022   AST 28 09/17/2022   ALT 40 09/17/2022   PROT 8.1 09/17/2022   ALBUMIN 4.2 09/17/2022   CALCIUM 9.3 09/17/2022   ANIONGAP 10 09/17/2022   EGFR 90 01/16/2022   Lab Results  Component Value Date   CHOL 160 01/16/2022   Lab Results  Component Value Date   HDL 55 01/16/2022   Lab  Results  Component Value Date   LDLCALC 91 01/16/2022   Lab Results  Component Value Date   TRIG 71 01/16/2022   Lab Results  Component Value Date   CHOLHDL 2.9 01/16/2022   Lab Results  Component Value Date   HGBA1C 8.6 (A) 06/22/2022      Assessment & Plan:   Problem List Items Addressed This Visit       Cardiovascular and Mediastinum   Essential hypertension - Primary    BP Readings from Last 1 Encounters:  09/18/22 134/70  Well-controlled with Lisinopril 40 mg QD Counseled for compliance with the medications Advised DASH diet and moderate exercise/walking, at least 150 mins/week      Relevant Orders   CBC with Differential/Platelet     Endocrine   Type 2 diabetes mellitus with hyperglycemia    Lab Results  Component Value Date   HGBA1C 8.6 (A) 06/22/2022  Uncontrolled Follows up with Endocrinology On Lantus 60 units and NovoLog ISS Continue metformin and Ozempic Tried to give her statin in the past, had transaminitis -will try again to add statin later Takes Lyrica for neuropathy/low back pain      Relevant Orders   CMP14+EGFR   Hypothyroidism    Lab Results  Component Value Date   TSH 1.050 01/16/2022  On levothyroxine 75 mcg QD, followed by Endocrinology      Relevant Orders   CBC with Differential/Platelet     Other   Morbid obesity    Diet modification and moderate exercise advised On Ozempic for type II DM      Mixed hyperlipidemia    Last lipid profile reviewed Was on statin due to DM, held statin for now due to elevated liver enzymes      Relevant Orders   Lipid panel   Primary insomnia    Sleep hygiene discussed, material provided Advised to try melatonin as needed Has history of mild OSA, but does not tolerate CPAP device      Encounter for examination following treatment at hospital    ER chart reviewed,  including blood tests Was given IV fluids and Zofran for acute gastroenteritis-same symptoms resolved now Advised to  maintain adequate hydration      Other Visit Diagnoses     Vitamin D deficiency       Relevant Orders   VITAMIN D 25 Hydroxy (Vit-D Deficiency, Fractures)     No orders of the defined types were placed in this encounter.   Follow-up: Return in about 4 months (around 01/18/2023) for Annual physical.    Anabel Halon, MD

## 2022-09-18 NOTE — Assessment & Plan Note (Signed)
Lab Results  Component Value Date   TSH 1.050 01/16/2022   On levothyroxine 75 mcg QD, followed by Endocrinology 

## 2022-09-18 NOTE — Patient Instructions (Signed)
Please continue to take medications as prescribed.  Please continue to follow low carb diet and perform moderate exercise/walking at least 150 mins/week.  Please maintain simple sleep hygiene. - Maintain dark and non-noisy environment in the bedroom. - Please use the bedroom for sleep and sexual activity only. - Do not use electronic devices in the bedroom. - Please take dinner at least 2 hours before bedtime. - Please avoid caffeinated products in the evening, including coffee, soft drinks. - Please try to maintain the regular sleep-wake cycle - Go to bed and wake up at the same time.

## 2022-09-18 NOTE — Assessment & Plan Note (Addendum)
BP Readings from Last 1 Encounters:  09/18/22 134/70   Well-controlled with Lisinopril 40 mg QD Counseled for compliance with the medications Advised DASH diet and moderate exercise/walking, at least 150 mins/week

## 2022-09-18 NOTE — Assessment & Plan Note (Signed)
Last lipid profile reviewed Was on statin due to DM, held statin for now due to elevated liver enzymes 

## 2022-09-18 NOTE — Assessment & Plan Note (Signed)
Sleep hygiene discussed, material provided Advised to try melatonin as needed Has history of mild OSA, but does not tolerate CPAP device

## 2022-09-19 DIAGNOSIS — H95121 Granulation of postmastoidectomy cavity, right ear: Secondary | ICD-10-CM | POA: Diagnosis not present

## 2022-09-19 NOTE — Telephone Encounter (Signed)
ASA 3. Needs office visit.

## 2022-09-21 ENCOUNTER — Other Ambulatory Visit: Payer: Self-pay | Admitting: Internal Medicine

## 2022-09-21 DIAGNOSIS — L304 Erythema intertrigo: Secondary | ICD-10-CM

## 2022-09-24 ENCOUNTER — Other Ambulatory Visit: Payer: Self-pay | Admitting: Nurse Practitioner

## 2022-09-24 DIAGNOSIS — E1165 Type 2 diabetes mellitus with hyperglycemia: Secondary | ICD-10-CM

## 2022-09-27 ENCOUNTER — Telehealth: Payer: Self-pay

## 2022-09-27 ENCOUNTER — Ambulatory Visit: Payer: Medicaid Other | Admitting: Gastroenterology

## 2022-09-27 NOTE — Progress Notes (Addendum)
Primary Care Physician:  Anabel Halon, MD Primary Gastroenterologist:  Dr. Jena Gauss  Chief Complaint  Patient presents with   Colonoscopy    Colonoscopy screening    HPI:   Sandy Valencia is a 62 y.o. female presenting today to discuss scheduling surveillance colonoscopy.    Today:  No bowel concerns.  Denies constipation, diarrhea, BRBPR, melena, unintentional weight loss.  Reports she is having some solid food dysphagia. Present for about 1 year or so. GERD is well controlled on Protonix 40 mg daily. Prior dilation in 2016 was very helpful.      Last colonoscopy 09/23/2017: - Internal hemorrhoids.  - Three 5 to 6 mm polyps in the rectum, at the splenic flexure and at the hepatic flexure, removed with a cold snare. Resected and retrieved. - Diverticulosis in the sigmoid colon and in the descending colon.  - The examination was otherwise normal on direct and retroflexion views. - Pathology with tubular adenomas and rectal hyperplastic polyp. Recommended 5 year surveillance.    Mom with history of colon cancer.  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   Anaphylactic shock, unspecified, subsequent encounter    Anemia, iron deficiency 02/14/2015   Angio-edema    Arthritis    Asthma    Asymptomatic varicose veins of right lower extremity    Chronic obstructive pulmonary disease with (acute) exacerbation (HCC)    Chronic rhinitis 05/21/2018   COVID-19    Diabetes mellitus    Essential (primary) hypertension    Family history of colon cancer 09/03/2012   Fatty liver    Gastro-esophageal reflux disease without esophagitis    GERD (gastroesophageal reflux disease)    History of colonic polyps 08/26/2017   Hypercholesteremia    Hypertension    Hypothyroidism    Low back pain    Mass of finger, right s/p surgical excision (mucoid cyst) 04/17/18 04/22/2018   Mucous cyst of digit  of right hand    Obesity, unspecified    Other adverse food reactions, not elsewhere classified, initial encounter    Personal history of noncompliance with medical treatment, presenting hazards to health 05/18/2015   Rash and other nonspecific skin eruption    Sleep apnea    pt siad, "i had small amount but could not tolerate CPAP and they said it was ok since it was mild.   Sleep apnea, unspecified    Unspecified asthma, uncomplicated    Viral URI 07/13/2022   Zoster without complications     Past Surgical History:  Procedure Laterality Date   ABDOMINAL HYSTERECTOMY     CATARACT EXTRACTION W/PHACO Left 03/09/2022   Procedure: CATARACT EXTRACTION PHACO AND INTRAOCULAR LENS PLACEMENT (IOC);  Surgeon: Fabio Pierce, MD;  Location: [facility];  Service: Ophthalmology;  Laterality: Left;  CDE 7.89   CATARACT EXTRACTION W/PHACO Right 03/23/2022   Procedure: CATARACT EXTRACTION PHACO AND INTRAOCULAR LENS PLACEMENT (IOC);  Surgeon: Fabio Pierce, MD;  Location: [facility];  Service: Ophthalmology;  Laterality: Right;  CDE: 2.63   CHOLECYSTECTOMY     COLONOSCOPY N/A 09/22/2012   RMR: colonic polyps -removed as described above. tubular adenoma, next TCS 09/2017   COLONOSCOPY WITH PROPOFOL N/A 09/23/2017   Surgeon: Corbin Ade, MD; internal hemorrhoids, three 5-6 mm polyps removed, diverticulosis in sigmoid and descending colon.  Pathology with tubular adenomas and rectal hyperplastic polyp.  Recommended 5 year surveillance.   CYST  EXCISION Left 07/19/2016   Procedure: CYST REMOVAL LEFT RING FINGER;  Surgeon: Vickki Hearing, MD;  Location: [facility];  Service: Orthopedics;  Laterality: Left;   CYST REMOVAL LEG Right    foot   ESOPHAGOGASTRODUODENOSCOPY N/A 10/21/2014   RMR: Small hiatal hernia; otherwise normal EGD status post passage of a Maloney dilator.    EXCISION MASS UPPER EXTREMETIES Right 04/17/2018   Procedure: EXCISION MASS UPPER EXTREMETIES right ring finger;  Surgeon: Vickki Hearing, MD;  Location: [facility];  Service: Orthopedics;  Laterality: Right;   MALONEY DILATION N/A 10/21/2014   Procedure: Elease Hashimoto DILATION;  Surgeon: Corbin Ade, MD;  Location: [facility];  Service: Endoscopy;  Laterality: N/A;   MIDDLE EAR SURGERY Right    patched hole in ear drum   POLYPECTOMY  09/23/2017   Procedure: POLYPECTOMY;  Surgeon: Corbin Ade, MD;  Location: [facility];  Service: Endoscopy;;  polyp hepatic flexure polyp cs, splenic flexure polyp cs, rectal polyp cs   TOTAL ABDOMINAL HYSTERECTOMY      Current Outpatient Medications  Medication Sig Dispense Refill   Accu-Chek FastClix Lancets MISC USE TO TEST 4 TIMES DAILY. 102 each 0   Accu-Chek FastClix Lancets MISC USE TO TEST 4 TIMES DAILY. 102 each 0   Accu-Chek FastClix Lancets MISC USE TO TEST 4 TIMES DAILY. 102 each 0   Accu-Chek FastClix Lancets MISC USE TO TEST 4 TIMES DAILY. 102 each 0   ACCU-CHEK GUIDE test strip USE TO TEST 4 TIMES DAILY. 100 strip 0   albuterol (VENTOLIN HFA) 108 (90 Base) MCG/ACT inhaler INHALE 4 PUFFS INTO THE LUNGS EVERY 6 HOURS AS NEEDED FOR WHEEZING OR SHORTNESS OF BREATH 18 g 0   aspirin EC 81 MG tablet Take 81 mg by mouth daily.     cetirizine (ZYRTEC) 10 MG tablet Take 1 tablet (10 mg total) by mouth daily as needed for allergies. 30 tablet 5   Cholecalciferol (VITAMIN D-3) 1000 UNITS CAPS Take 1,000 Units by mouth daily.      Continuous Blood Gluc Sensor (DEXCOM G6 SENSOR) MISC CHANGE SENSOR EVERY 10 DAYS AS DIRECTED. 3 each 2   Continuous Glucose Transmitter (DEXCOM G6 TRANSMITTER) MISC CHANGE TRANSMITTER EVERY 90 DAYS AS DIRECTED. 1 each 0   diclofenac Sodium (VOLTAREN) 1 % GEL APPLY 2 GRAMS TO AFFECTED AREAS 2 TIMES A DAY AS NEEDED 100 g 0   EPINEPHrine 0.3 mg/0.3 mL IJ SOAJ injection Inject 0.3 mg into the muscle as needed for anaphylaxis. 2 each 1   fluticasone (FLONASE) 50 MCG/ACT nasal spray Place 1 spray into both nostrils daily. 16 g 3   HYDROcodone-acetaminophen (NORCO)  7.5-325 MG tablet Take 1 tablet by mouth 2 times daily at 12 noon and 4 pm.     insulin aspart (NOVOLOG FLEXPEN) 100 UNIT/ML FlexPen Inject 20-26 Units into the skin 3 (three) times daily with meals. 60 mL 3   ketoconazole (NIZORAL) 2 % cream APPLY TO AFFECTED AREAS TWICE DAILY 60 g 2   LANTUS SOLOSTAR 100 UNIT/ML Solostar Pen Inject 60 Units into the skin at bedtime. 60 mL 3   levothyroxine (SYNTHROID) 75 MCG tablet TAKE ONE (1) TABLET BY MOUTH EVERY DAY BEFORE BREAKFAST 90 tablet 0   lisinopril (ZESTRIL) 40 MG tablet Take 1 tablet (40 mg total) by mouth daily. 90 tablet 3   LITETOUCH PEN NEEDLES 31G X 8 MM MISC USE AS DIRECTED FOUR TIMES DAILY. 100 each 0   metFORMIN (GLUCOPHAGE-XR) 500 MG 24  hr tablet TAKE ONE TABLET BY MOUTH TWICE A DAY AFTER A MEAL 180 tablet 0   ondansetron (ZOFRAN) 4 MG tablet Take 1 tablet (4 mg total) by mouth every 6 (six) hours. 12 tablet 0   OZEMPIC, 1 MG/DOSE, 4 MG/3ML SOPN INJECT 1MG  ONCE A WEEK AS DIRECTED 9 mL 0   pantoprazole (PROTONIX) 40 MG tablet Take 1 tablet (40 mg total) by mouth daily. 30 tablet 11   pregabalin (LYRICA) 100 MG capsule Take 100 mg by mouth 2 (two) times daily.     vitamin B-12 (CYANOCOBALAMIN) 500 MCG tablet Take 500 mcg by mouth daily.     No current facility-administered medications for this visit.    Allergies as of 09/28/2022 - Review Complete 09/28/2022  Allergen Reaction Noted   Kiwi extract Anaphylaxis, Swelling, and Palpitations 04/07/2018   Other Anaphylaxis 04/22/2018   Statins  07/20/2022    Family History  Problem Relation Age of Onset   Colon cancer Mother        diagnosed age 81, underwent surgical resection, metastatic disease several years later   Diabetes Mother    Heart attack Father    Diabetes Sister    Hypothyroidism Brother    Diabetes Sister    Diabetes Sister    Diabetes Sister    Hypothyroidism Sister    Allergic rhinitis Neg Hx    Angioedema Neg Hx    Asthma Neg Hx    Atopy Neg Hx    Eczema Neg  Hx    Immunodeficiency Neg Hx    Urticaria Neg Hx     Social History   Socioeconomic History   Marital status: Divorced    Spouse name: Not on file   Number of children: 2   Years of education: Not on file   Highest education level: Not on file  Occupational History   Not on file  Tobacco Use   Smoking status: Never   Smokeless tobacco: Never  Vaping Use   Vaping Use: Never used  Substance and Sexual Activity   Alcohol use: No    Alcohol/week: 0.0 standard drinks of alcohol   Drug use: No   Sexual activity: Yes    Birth control/protection: Surgical  Other Topics Concern   Not on file  Social History Narrative   Lives with grandson (since born)      Enjoys: vacation, travel, concerts      Diet: eats all food groups    Caffeine: diet Pepsi daily    Water:  8 cups daily       Wears seat belt    Does use phone   Smoke Retail buyer in safe area    Social Determinants of Health   Financial Resource Strain: Low Risk  (12/02/2019)   Overall Financial Resource Strain (CARDIA)    Difficulty of Paying Living Expenses: Not hard at all  Food Insecurity: No Food Insecurity (12/02/2019)   Hunger Vital Sign    Worried About Running Out of Food in the Last Year: Never true    Ran Out of Food in the Last Year: Never true  Transportation Needs: No Transportation Needs (12/02/2019)   PRAPARE - Administrator, Civil Service (Medical): No    Lack of Transportation (Non-Medical): No  Physical Activity: Insufficiently Active (12/02/2019)   Exercise Vital Sign    Days of Exercise per Week: 2 days    Minutes of Exercise per Session: 20 min  Stress: Not on file  Social Connections: Moderately Isolated (12/02/2019)   Social Connection and Isolation Panel [NHANES]    Frequency of Communication with Friends and Family: More than three times a week    Frequency of Social Gatherings with Friends and Family: More than three times a week    Attends Religious Services: More  than 4 times per year    Active Member of [employer] or Organizations: No    Attends Banker Meetings: Never    Marital Status: Divorced  Catering manager Violence: Not At Risk (12/02/2019)   Humiliation, Afraid, Rape, and Kick questionnaire    Fear of Current or Ex-Partner: No    Emotionally Abused: No    Physically Abused: No    Sexually Abused: No    Review of Systems: Gen: Denies any fever, chills, cold or flulike symptoms, presyncope, syncope. CV: Denies chest pain, heart palpitations. Resp: Denies shortness of breath, cough. GI: See HPI GU : Denies urinary burning, urinary frequency, urinary hesitancy MS: Denies joint pain. Derm: Denies rash. Psych: Denies depression, anxiety. Heme: See HPI  Physical Exam: BP (!) 167/90 (BP Location: Right Arm, Patient Position: Sitting, Cuff Size: Large)   Pulse 61   Temp 97.6 F (36.4 C) (Temporal)   Ht 5\' 6"  (1.676 m)   Wt 227 lb 12.8 oz (103.3 kg)   SpO2 98%   BMI 36.77 kg/m  General:   Alert and oriented. Pleasant and cooperative. Well-nourished and well-developed.  Head:  Normocephalic and atraumatic. Eyes:  Without icterus, sclera clear and conjunctiva pink.  Ears:  Normal auditory acuity. Lungs:  Clear to auscultation bilaterally. No wheezes, rales, or rhonchi. No distress.  Heart:  S1, S2 present without murmurs appreciated.  Abdomen:  +BS, soft, non-tender and non-distended. No HSM noted. No guarding or rebound. No masses appreciated.  Rectal:  Deferred  Msk:  Symmetrical without gross deformities. Normal posture. Extremities:  Without edema. Neurologic:  Alert and  oriented x4;  grossly normal neurologically. Skin:  Intact without significant lesions or rashes. Psych:  Normal mood and affect.    Assessment:  62 year old female with history of COPD, diabetes, HTN, GERD, HLD, hypothyroidism, adenomatous colon polyps, presenting today to discuss scheduling surveillance colonoscopy.  Also reporting  dysphagia.  History of colon polyps: Currently due for surveillance.  Last colonoscopy in April 2019 with tubular adenomas and rectal hyperplastic polyp removed with recommendations to repeat in 5 years.  She has no significant lower GI symptoms or alarm symptoms.  Mom with history of colon cancer.  Dysphagia: Recurrent solid food dysphagia over the last year.  Prior empiric esophageal dilation in 2016 was very helpful.  She does have chronic GERD, but reports symptoms are well-controlled on pantoprazole 40 mg daily.  We will update EGD with possible dilation as appropriate for further evaluation and therapeutic intervention.   Plan:  Proceed with upper endoscopy +/- dilation + colonoscopy with propofol by Dr. Jena Gauss in near future. The risks, benefits, and alternatives have been discussed with the patient in detail. The patient states understanding and desires to proceed.  ASA 3 Hold Ozempic x 1 week prior to procedures. 1 day prior to procedures: Take metformin as prescribed, mealtime insulin as needed. They have procedures: Do not take any morning diabetes medications Swallowing precautions discussed.  Separate written instructions provided on AVS. Continue pantoprazole 40 mg daily. Follow-up after procedures.   Ermalinda Memos, PA-C [facility] 09/28/2022

## 2022-09-27 NOTE — Telephone Encounter (Signed)
PA request received via CMM for Dexcom G6 Sensor  PA has been submitted to [facility] [place] Medicaid and is pending determination  Key : [id number]

## 2022-09-27 NOTE — Telephone Encounter (Signed)
PA has been APPROVED from 09/27/2022-09/26/2023

## 2022-09-28 ENCOUNTER — Encounter: Payer: Self-pay | Admitting: Gastroenterology

## 2022-09-28 ENCOUNTER — Ambulatory Visit: Payer: Medicaid Other | Admitting: Gastroenterology

## 2022-09-28 VITALS — BP 167/90 | HR 61 | Temp 97.6°F | Ht 66.0 in | Wt 227.8 lb

## 2022-09-28 DIAGNOSIS — Z86010 Personal history of colonic polyps: Secondary | ICD-10-CM

## 2022-09-28 DIAGNOSIS — R1310 Dysphagia, unspecified: Secondary | ICD-10-CM | POA: Diagnosis not present

## 2022-09-28 NOTE — Patient Instructions (Addendum)
We will schedule you to have an colonoscopy and upper endoscopy with possible stretching of your esophagus in the near future.  You will need to hold Ozempic for 1 week prior to your procedures. 1 day prior to your procedures: Take metformin as prescribed, use mealtime insulin as needed, take one half dose of Lantus at bedtime (30 units) Day of your procedures: Do not take any morning diabetes medications.   Swallowing precautions:  Eat slowly, take small bites, chew thoroughly, drink plenty of liquids throughout meals.  Avoid trough textures All meats should be chopped finely.  If something gets hung in your esophagus and will not come up or go down, proceed to the emergency room.    We will see back after your procedures.  It was nice to meet you today!   Ermalinda Memos, PA-C [facility]

## 2022-10-01 ENCOUNTER — Telehealth: Payer: Self-pay | Admitting: *Deleted

## 2022-10-01 ENCOUNTER — Encounter: Payer: Self-pay | Admitting: Gastroenterology

## 2022-10-01 MED ORDER — CLENPIQ 10-3.5-12 MG-GM -GM/175ML PO SOLN: 1.0000 | ORAL | 0 refills | Status: DC

## 2022-10-01 NOTE — Telephone Encounter (Signed)
Spoke with pt. Scheduled for TCS/EGD +/-ed with Dr. Jena Gauss, asa 3 on 6/6. Aware will mail instructions. She requested low volume prep. Also aware will have to hold ozempic x 1 week prior.

## 2022-10-02 ENCOUNTER — Encounter: Payer: Self-pay | Admitting: *Deleted

## 2022-10-09 DIAGNOSIS — Z79899 Other long term (current) drug therapy: Secondary | ICD-10-CM | POA: Diagnosis not present

## 2022-10-09 DIAGNOSIS — Z79891 Long term (current) use of opiate analgesic: Secondary | ICD-10-CM | POA: Diagnosis not present

## 2022-10-09 DIAGNOSIS — G894 Chronic pain syndrome: Secondary | ICD-10-CM | POA: Diagnosis not present

## 2022-10-09 DIAGNOSIS — M545 Low back pain, unspecified: Secondary | ICD-10-CM | POA: Diagnosis not present

## 2022-10-09 DIAGNOSIS — M5116 Intervertebral disc disorders with radiculopathy, lumbar region: Secondary | ICD-10-CM | POA: Diagnosis not present

## 2022-10-10 DIAGNOSIS — L600 Ingrowing nail: Secondary | ICD-10-CM | POA: Diagnosis not present

## 2022-10-10 DIAGNOSIS — B351 Tinea unguium: Secondary | ICD-10-CM | POA: Diagnosis not present

## 2022-10-10 DIAGNOSIS — L03031 Cellulitis of right toe: Secondary | ICD-10-CM | POA: Diagnosis not present

## 2022-10-10 DIAGNOSIS — L03032 Cellulitis of left toe: Secondary | ICD-10-CM | POA: Diagnosis not present

## 2022-10-11 ENCOUNTER — Other Ambulatory Visit: Payer: Self-pay | Admitting: Internal Medicine

## 2022-10-16 DIAGNOSIS — Z794 Long term (current) use of insulin: Secondary | ICD-10-CM | POA: Diagnosis not present

## 2022-10-16 DIAGNOSIS — E1165 Type 2 diabetes mellitus with hyperglycemia: Secondary | ICD-10-CM | POA: Diagnosis not present

## 2022-10-16 DIAGNOSIS — E039 Hypothyroidism, unspecified: Secondary | ICD-10-CM | POA: Diagnosis not present

## 2022-10-17 LAB — COMPREHENSIVE METABOLIC PANEL
ALT: 36 IU/L — ABNORMAL HIGH (ref 0–32)
AST: 27 IU/L (ref 0–40)
Albumin/Globulin Ratio: 1.8 (ref 1.2–2.2)
Albumin: 4.5 g/dL (ref 3.9–4.9)
Alkaline Phosphatase: 105 IU/L (ref 44–121)
BUN/Creatinine Ratio: 14 (ref 12–28)
BUN: 11 mg/dL (ref 8–27)
Bilirubin Total: 0.3 mg/dL (ref 0.0–1.2)
CO2: 24 mmol/L (ref 20–29)
Calcium: 9.7 mg/dL (ref 8.7–10.3)
Chloride: 105 mmol/L (ref 96–106)
Creatinine, Ser: 0.78 mg/dL (ref 0.57–1.00)
Globulin, Total: 2.5 g/dL (ref 1.5–4.5)
Glucose: 100 mg/dL — ABNORMAL HIGH (ref 70–99)
Potassium: 4.2 mmol/L (ref 3.5–5.2)
Sodium: 144 mmol/L (ref 134–144)
Total Protein: 7 g/dL (ref 6.0–8.5)
eGFR: 86 mL/min/{1.73_m2} (ref >59)

## 2022-10-17 LAB — T4, FREE: Free T4: 1.07 ng/dL (ref 0.82–1.77)

## 2022-10-17 LAB — TSH: TSH: 2.51 u[IU]/mL (ref 0.450–4.500)

## 2022-10-22 ENCOUNTER — Other Ambulatory Visit: Payer: Self-pay | Admitting: Nurse Practitioner

## 2022-10-22 ENCOUNTER — Ambulatory Visit: Payer: Medicaid Other | Admitting: Nurse Practitioner

## 2022-10-22 ENCOUNTER — Encounter: Payer: Self-pay | Admitting: Nurse Practitioner

## 2022-10-22 VITALS — BP 150/90 | HR 63 | Ht 66.0 in | Wt 230.8 lb

## 2022-10-22 DIAGNOSIS — E782 Mixed hyperlipidemia: Secondary | ICD-10-CM | POA: Diagnosis not present

## 2022-10-22 DIAGNOSIS — E559 Vitamin D deficiency, unspecified: Secondary | ICD-10-CM | POA: Diagnosis not present

## 2022-10-22 DIAGNOSIS — I10 Essential (primary) hypertension: Secondary | ICD-10-CM | POA: Diagnosis not present

## 2022-10-22 DIAGNOSIS — E039 Hypothyroidism, unspecified: Secondary | ICD-10-CM | POA: Diagnosis not present

## 2022-10-22 DIAGNOSIS — Z794 Long term (current) use of insulin: Secondary | ICD-10-CM | POA: Diagnosis not present

## 2022-10-22 DIAGNOSIS — E1165 Type 2 diabetes mellitus with hyperglycemia: Secondary | ICD-10-CM | POA: Diagnosis not present

## 2022-10-22 LAB — POCT UA - MICROALBUMIN
Creatinine, POC: 300 mg/dL
Microalbumin Ur, POC: 80 mg/L

## 2022-10-22 LAB — POCT GLYCOSYLATED HEMOGLOBIN (HGB A1C): Hemoglobin A1C: 8.6 % — AB (ref 4.0–5.6)

## 2022-10-22 NOTE — Progress Notes (Signed)
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   Anaphylactic shock, unspecified, subsequent encounter    Anemia, iron deficiency 02/14/2015   Angio-edema    Arthritis    Asthma    Asymptomatic varicose veins of right lower extremity    Chronic obstructive pulmonary disease with (acute) exacerbation (HCC)    Chronic rhinitis 05/21/2018   COVID-19    Diabetes mellitus    Essential (primary) hypertension    Family history of colon cancer 09/03/2012   Fatty liver    Gastro-esophageal reflux disease without esophagitis    GERD (gastroesophageal reflux disease)    History of colonic polyps 08/26/2017   Hypercholesteremia    Hypertension    Hypothyroidism    Low back pain    Mass of finger, right s/p surgical excision (mucoid cyst) 04/17/18 04/22/2018   Mucous cyst of digit of right hand    Obesity, unspecified    Other adverse food reactions, not elsewhere classified, initial encounter    Personal history of noncompliance with medical treatment, presenting hazards to health 05/18/2015   Rash and other nonspecific skin eruption    Sleep apnea    pt siad, "i had small amount but could not tolerate CPAP and they said it was ok since it was mild.   Sleep apnea, unspecified    Unspecified asthma, uncomplicated    Viral URI 07/13/2022   Zoster without complications    Past Surgical History:  Procedure Laterality Date   ABDOMINAL HYSTERECTOMY     CATARACT EXTRACTION W/PHACO Left 03/09/2022   Procedure: CATARACT EXTRACTION PHACO AND INTRAOCULAR LENS PLACEMENT (IOC);  Surgeon: Fabio Pierce, MD;  Location: [facility];  Service: Ophthalmology;  Laterality: Left;  CDE 7.89   CATARACT EXTRACTION W/PHACO Right 03/23/2022   Procedure: CATARACT EXTRACTION PHACO AND  INTRAOCULAR LENS PLACEMENT (IOC);  Surgeon: Fabio Pierce, MD;  Location: [facility];  Service: Ophthalmology;  Laterality: Right;  CDE: 2.63   CHOLECYSTECTOMY     COLONOSCOPY N/A 09/22/2012   RMR: colonic polyps -removed as described above. tubular adenoma, next TCS 09/2017   COLONOSCOPY WITH PROPOFOL N/A 09/23/2017   Surgeon: Corbin Ade, MD; internal hemorrhoids, three 5-6 mm polyps removed, diverticulosis in sigmoid and descending colon.  Pathology with tubular adenomas and rectal hyperplastic polyp.  Recommended 5 year surveillance.   CYST EXCISION Left 07/19/2016   Procedure: CYST REMOVAL LEFT RING FINGER;  Surgeon: Vickki Hearing, MD;  Location: [facility];  Service: Orthopedics;  Laterality: Left;   CYST REMOVAL LEG Right    foot   ESOPHAGOGASTRODUODENOSCOPY N/A 10/21/2014   RMR: Small hiatal hernia; otherwise normal EGD status post passage of a Maloney dilator.    EXCISION MASS UPPER EXTREMETIES Right 04/17/2018   Procedure: EXCISION MASS UPPER EXTREMETIES right ring finger;  Surgeon: Vickki Hearing, MD;  Location: [facility];  Service: Orthopedics;  Laterality: Right;   MALONEY DILATION N/A 10/21/2014   Procedure: Elease Hashimoto DILATION;  Surgeon: Corbin Ade, MD;  Location: [facility];  Service: Endoscopy;  Laterality: N/A;   MIDDLE EAR SURGERY Right    patched hole in ear drum   POLYPECTOMY  09/23/2017   Procedure: POLYPECTOMY;  Surgeon: Corbin Ade, MD;  Location: [facility];  Service: Endoscopy;;  polyp hepatic flexure polyp cs, splenic flexure polyp cs, rectal polyp cs   TOTAL ABDOMINAL HYSTERECTOMY     Social History   Socioeconomic History   Marital status: Divorced    Spouse name: Not on file   Number of children: 2   Years of education: Not on file   Highest education level: Not on file  Occupational History   Not on file  Tobacco Use   Smoking status: Never   Smokeless tobacco: Never  Vaping Use   Vaping Use: Never used  Substance and Sexual Activity    Alcohol use: No    Alcohol/week: 0.0 standard drinks of alcohol   Drug use: No   Sexual activity: Yes    Birth control/protection: Surgical  Other Topics Concern   Not on file  Social History Narrative   Lives with grandson (since born)      Enjoys: vacation, travel, concerts      Diet: eats all food groups    Caffeine: diet Pepsi daily    Water:  8 cups daily       Wears seat belt    Does use phone   Smoke Retail buyer in safe area    Social Determinants of Health   Financial Resource Strain: Low Risk  (12/02/2019)   Overall Financial Resource Strain (CARDIA)    Difficulty of Paying Living Expenses: Not hard at all  Food Insecurity: No Food Insecurity (12/02/2019)   Hunger Vital Sign    Worried About Running Out of Food in the Last Year: Never true    Ran Out of Food in the Last Year: Never true  Transportation Needs: No Transportation Needs (12/02/2019)   PRAPARE - Administrator, Civil Service (Medical): No    Lack of Transportation (Non-Medical): No  Physical Activity: Insufficiently Active (12/02/2019)   Exercise Vital Sign    Days of Exercise per Week: 2 days    Minutes of Exercise per Session: 20 min  Stress: Not on file  Social Connections: Moderately Isolated (12/02/2019)   Social Connection and Isolation Panel [NHANES]    Frequency of Communication with Friends and Family: More than three times a week    Frequency of Social Gatherings with Friends and Family: More than three times a week    Attends Religious Services: More than 4 times per year    Active Member of [employer] or Organizations: No    Attends Banker Meetings: Never    Marital Status: Divorced   Outpatient Encounter Medications as of 10/22/2022  Medication Sig   Accu-Chek FastClix Lancets MISC USE TO TEST 4 TIMES DAILY.   Accu-Chek FastClix Lancets MISC USE TO TEST 4 TIMES DAILY.   Accu-Chek FastClix Lancets MISC USE TO TEST 4 TIMES DAILY.   Accu-Chek FastClix  Lancets MISC USE TO TEST 4 TIMES DAILY.   ACCU-CHEK GUIDE test strip USE TO TEST 4 TIMES DAILY.   albuterol (VENTOLIN HFA) 108 (90 Base) MCG/ACT inhaler INHALE 4 PUFFS INTO THE LUNGS EVERY 6 HOURS AS NEEDED FOR WHEEZING OR SHORTNESS OF BREATH   aspirin EC 81 MG tablet Take 81 mg by mouth daily.   cetirizine (ZYRTEC) 10 MG tablet Take 1 tablet (10 mg total) by mouth daily as needed for allergies.   Cholecalciferol (VITAMIN D-3) 1000 UNITS CAPS Take 1,000 Units by mouth daily.    Continuous Blood Gluc Sensor (DEXCOM G6 SENSOR) MISC CHANGE SENSOR EVERY 10 DAYS AS DIRECTED.  Continuous Glucose Transmitter (DEXCOM G6 TRANSMITTER) MISC CHANGE TRANSMITTER EVERY 90 DAYS AS DIRECTED.   diclofenac Sodium (VOLTAREN) 1 % GEL APPLY 2 GRAMS TO AFFECTED AREAS 2 TIMES A DAY AS NEEDED   EPINEPHrine 0.3 mg/0.3 mL IJ SOAJ injection Inject 0.3 mg into the muscle as needed for anaphylaxis.   fluticasone (FLONASE) 50 MCG/ACT nasal spray Place 1 spray into both nostrils daily.   HYDROcodone-acetaminophen (NORCO) 7.5-325 MG tablet Take 1 tablet by mouth 2 times daily at 12 noon and 4 pm.   insulin aspart (NOVOLOG FLEXPEN) 100 UNIT/ML FlexPen Inject 20-26 Units into the skin 3 (three) times daily with meals.   ketoconazole (NIZORAL) 2 % cream APPLY TO AFFECTED AREAS TWICE DAILY   LANTUS SOLOSTAR 100 UNIT/ML Solostar Pen Inject 60 Units into the skin at bedtime.   levothyroxine (SYNTHROID) 75 MCG tablet TAKE ONE (1) TABLET BY MOUTH EVERY DAY BEFORE BREAKFAST   lisinopril (ZESTRIL) 40 MG tablet Take 1 tablet (40 mg total) by mouth daily.   LITETOUCH PEN NEEDLES 31G X 8 MM MISC USE AS DIRECTED FOUR TIMES DAILY.   metFORMIN (GLUCOPHAGE-XR) 500 MG 24 hr tablet TAKE ONE TABLET BY MOUTH TWICE A DAY AFTER A MEAL   ondansetron (ZOFRAN) 4 MG tablet Take 1 tablet (4 mg total) by mouth every 6 (six) hours.   OZEMPIC, 1 MG/DOSE, 4 MG/3ML SOPN INJECT 1MG  ONCE A WEEK AS DIRECTED   pantoprazole (PROTONIX) 40 MG tablet Take 1 tablet  (40 mg total) by mouth daily.   pregabalin (LYRICA) 100 MG capsule Take 100 mg by mouth 2 (two) times daily.   vitamin B-12 (CYANOCOBALAMIN) 500 MCG tablet Take 500 mcg by mouth daily.   Sod Picosulfate-Mag Ox-Cit Acd (CLENPIQ) 10-3.5-12 MG-GM -GM/175ML SOLN Take 1 kit by mouth as directed. (Patient not taking: Reported on 10/22/2022)   No facility-administered encounter medications on file as of 10/22/2022.   ALLERGIES: Allergies  Allergen Reactions   Kiwi Extract Anaphylaxis, Swelling and Palpitations   Other Anaphylaxis    Walnuts (avoids all tree nuts)   Statins     Transaminase elevation   VACCINATION STATUS: Immunization History  Administered Date(s) Administered   Influenza,inj,Quad PF,6+ Mos 04/15/2017, 03/30/2019   Influenza-Unspecified 04/17/2011, 03/24/2012, 05/12/2013, 02/23/2015, 04/04/2017, 04/08/2018   Moderna Sars-Covid-2 Vaccination 10/22/2019, 11/19/2019   Tdap 09/18/2021   Zoster Recombinat (Shingrix) 09/14/2021, 01/16/2022    Diabetes She presents for her follow-up diabetic visit. She has type 2 diabetes mellitus. Onset time: She was diagnosed at approximate age of 63 years. Her disease course has been stable. There are no hypoglycemic associated symptoms. Pertinent negatives for hypoglycemia include no confusion, nervousness/anxiousness, pallor or seizures. Pertinent negatives for diabetes include no fatigue, no polydipsia, no polyphagia, no polyuria and no weight loss. There are no hypoglycemic complications. Symptoms are stable. There are no diabetic complications. Risk factors for coronary artery disease include dyslipidemia, diabetes mellitus, hypertension, sedentary lifestyle, obesity, post-menopausal and stress. Current diabetic treatment includes intensive insulin program and oral agent (monotherapy) (and Ozempic). She is compliant with treatment most of the time. Her weight is fluctuating minimally. She is following a generally healthy diet. When asked about meal  planning, she reported none. She has had a previous visit with a dietitian. She never participates in exercise. Her home blood glucose trend is fluctuating minimally. Her overall blood glucose range is >200 mg/dl. (She presents today with her CGM showing above target glycemic profile overall.  Her POCT A1c today 8.6%, unchanged from previous visit.  Analysis of  her CGM shows TIR 12%, TAR 88%, TBR 0% with a GMI of 9.5%.  She notes she has been on/off her Ozempic for several medical reasons (will be having endoscopy and colonoscopy soon which she will have to stop her Ozempic as well).  She denies any hypoglycemia.  She notes she still stays up late at night and snacks during that time.) An ACE inhibitor/angiotensin II receptor blocker is being taken. She sees a podiatrist.Eye exam is current.  Hyperlipidemia This is a chronic problem. The current episode started more than 1 year ago. The problem is controlled. Recent lipid tests were reviewed and are normal. Exacerbating diseases include diabetes, hypothyroidism and obesity. There are no known factors aggravating her hyperlipidemia. Pertinent negatives include no myalgias. Current antihyperlipidemic treatment includes statins. The current treatment provides significant improvement of lipids. Compliance problems include medication cost, adherence to diet and adherence to exercise.  Risk factors for coronary artery disease include diabetes mellitus, dyslipidemia, hypertension, obesity, a sedentary lifestyle and post-menopausal.  Hypertension This is a chronic problem. The current episode started more than 1 year ago. The problem has been gradually improving since onset. The problem is controlled. Pertinent negatives include no palpitations. Agents associated with hypertension include thyroid hormones. Risk factors for coronary artery disease include diabetes mellitus, dyslipidemia, obesity, post-menopausal state, sedentary lifestyle and stress. Past treatments  include ACE inhibitors. The current treatment provides mild improvement. Compliance problems include diet and exercise.  Identifiable causes of hypertension include sleep apnea and a thyroid problem.  Thyroid Problem Presents for follow-up visit. Patient reports no anxiety, cold intolerance, constipation, depressed mood, diarrhea, dry skin, fatigue, hair loss, heat intolerance, palpitations, weight gain or weight loss. The symptoms have been stable. Her past medical history is significant for diabetes and hyperlipidemia.     Review of systems  Constitutional: + minimally fluctuating body weight,  current Body mass index is 37.25 kg/m. , no fatigue, no subjective hyperthermia, no subjective hypothermia Eyes: no blurry vision, no xerophthalmia ENT: no sore throat, no nodules palpated in throat, no dysphagia/odynophagia, no hoarseness Cardiovascular: no chest pain, no shortness of breath, no palpitations, no leg swelling Respiratory: no cough, no shortness of breath Gastrointestinal: no nausea/vomiting/diarrhea Musculoskeletal: no muscle/joint aches Skin: no rashes, no hyperemia Neurological: no tremors, no numbness, no tingling, no dizziness Psychiatric: no depression, no anxiety   Objective:    BP (!) 150/90 (BP Location: Left Arm, Patient Position: Sitting, Cuff Size: Large) Comment: Retake - Manuel cuff/ Patient states that she has not taken her BP med.  Pulse 63   Ht 5\' 6"  (1.676 m)   Wt 230 lb 12.8 oz (104.7 kg)   BMI 37.25 kg/m   Wt Readings from Last 3 Encounters:  10/22/22 230 lb 12.8 oz (104.7 kg)  09/28/22 227 lb 12.8 oz (103.3 kg)  09/18/22 220 lb 6.4 oz (100 kg)    BP Readings from Last 3 Encounters:  10/22/22 (!) 150/90  09/28/22 (!) 167/90  09/18/22 134/70     Physical Exam- Limited  Constitutional:  Body mass index is 37.25 kg/m. , not in acute distress, normal state of mind Eyes:  EOMI, no exophthalmos Musculoskeletal: no gross deformities, strength intact  in all four extremities, no gross restriction of joint movements Skin:  no rashes, no hyperemia Neurological: no tremor with outstretched hands    Lipid Panel     Component Value Date/Time   CHOL 160 01/16/2022 0931   TRIG 71 01/16/2022 0931   HDL 55 01/16/2022 0931  CHOLHDL 2.9 01/16/2022 0931   CHOLHDL 2.4 12/25/2019 0709   VLDL 10 03/07/2016 0705   LDLCALC 91 01/16/2022 0931   LDLCALC 65 12/25/2019 0709    Recent Results (from the past 2160 hour(s))  Lipase, blood     Status: None   Collection Time: 09/17/22  1:13 PM  Result Value Ref Range   Lipase 47 11 - 51 U/L    Comment: Performed at [facility], [address], [place], [place] [address]  Comprehensive metabolic panel     Status: Abnormal   Collection Time: 09/17/22  1:13 PM  Result Value Ref Range   Sodium 134 (L) 135 - 145 mmol/L   Potassium 4.2 3.5 - 5.1 mmol/L   Chloride 102 98 - 111 mmol/L   CO2 22 22 - 32 mmol/L   Glucose, Bld 305 (H) 70 - 99 mg/dL    Comment: Glucose reference range applies only to samples taken after fasting for at least 8 hours.   BUN 16 8 - 23 mg/dL   Creatinine, Ser 6.04 0.44 - 1.00 mg/dL   Calcium 9.3 8.9 - 54.0 mg/dL   Total Protein 8.1 6.5 - 8.1 g/dL   Albumin 4.2 3.5 - 5.0 g/dL   AST 28 15 - 41 U/L   ALT 40 0 - 44 U/L   Alkaline Phosphatase 68 38 - 126 U/L   Total Bilirubin 0.8 0.3 - 1.2 mg/dL   GFR, Estimated >98 >11 mL/min    Comment: (NOTE) Calculated using the CKD-EPI Creatinine Equation (2021)    Anion gap 10 5 - 15    Comment: Performed at [facility], [address], [place], [place] [address]  CBC     Status: None   Collection Time: 09/17/22  1:13 PM  Result Value Ref Range   WBC 8.0 4.0 - 10.5 K/uL   RBC 5.09 3.87 - 5.11 MIL/uL   Hemoglobin 14.5 12.0 - 15.0 g/dL   HCT 29.5 62.1 - 30.8 %   MCV 87.2 80.0 - 100.0 fL   MCH 28.5 26.0 - 34.0 pg   MCHC 32.7 30.0 - 36.0 g/dL   RDW 65.7 84.6 - 96.2 %   Platelets 237 150 - 400 K/uL   nRBC 0.0 0.0 - 0.2 %     Comment: Performed at [facility], [address], [place], [place] [address]  CBG monitoring, ED     Status: Abnormal   Collection Time: 09/17/22  7:04 PM  Result Value Ref Range   Glucose-Capillary 243 (H) 70 - 99 mg/dL    Comment: Glucose reference range applies only to samples taken after fasting for at least 8 hours.  Comprehensive metabolic panel     Status: Abnormal   Collection Time: 10/16/22  8:09 AM  Result Value Ref Range   Glucose 100 (H) 70 - 99 mg/dL   BUN 11 8 - 27 mg/dL   Creatinine, Ser 1.32 0.57 - 1.00 mg/dL   eGFR 86 >44 WN/UUV/2.53   BUN/Creatinine Ratio 14 12 - 28   Sodium 144 134 - 144 mmol/L   Potassium 4.2 3.5 - 5.2 mmol/L   Chloride 105 96 - 106 mmol/L   CO2 24 20 - 29 mmol/L   Calcium 9.7 8.7 - 10.3 mg/dL   Total Protein 7.0 6.0 - 8.5 g/dL   Albumin 4.5 3.9 - 4.9 g/dL   Globulin, Total 2.5 1.5 - 4.5 g/dL   Albumin/Globulin Ratio 1.8 1.2 - 2.2   Bilirubin Total 0.3 0.0 - 1.2  mg/dL   Alkaline Phosphatase 105 44 - 121 IU/L   AST 27 0 - 40 IU/L   ALT 36 (H) 0 - 32 IU/L  TSH     Status: None   Collection Time: 10/16/22  8:09 AM  Result Value Ref Range   TSH 2.510 0.450 - 4.500 uIU/mL  T4, free     Status: None   Collection Time: 10/16/22  8:09 AM  Result Value Ref Range   Free T4 1.07 0.82 - 1.77 ng/dL  POCT UA - Microalbumin     Status: Abnormal   Collection Time: 10/22/22  9:37 AM  Result Value Ref Range   Microalbumin Ur, POC 80 mg/L mg/L   Creatinine, POC 300 mg/dL mg/dL   Albumin/Creatinine Ratio, Urine, POC 30-300 mg/G   HgB A1c     Status: Abnormal   Collection Time: 10/22/22  9:38 AM  Result Value Ref Range   Hemoglobin A1C 8.6 (A) 4.0 - 5.6 %   HbA1c POC (<> result, manual entry)     HbA1c, POC (prediabetic range)     HbA1c, POC (controlled diabetic range)         Assessment & Plan:   1) Uncontrolled type 2 diabetes mellitus with complication, with long-term current use of insulin (HCC)  She presents today with her CGM showing  above target glycemic profile overall.  Her POCT A1c today 8.6%, unchanged from previous visit.  Analysis of her CGM shows TIR 12%, TAR 88%, TBR 0% with a GMI of 9.5%.  She notes she has been on/off her Ozempic for several medical reasons (will be having endoscopy and colonoscopy soon which she will have to stop her Ozempic as well).  She denies any hypoglycemia.  She notes she still stays up late at night and snacks during that time.  -Her diabetes is complicated by history of noncompliance (she recently showed better engagement) and patient remains at extremely high risk for more acute and chronic complications of diabetes which include CAD, CVA, CKD, retinopathy, and neuropathy. These are all discussed in detail with the patient.  - Nutritional counseling repeated at each appointment due to patients tendency to fall back in to old habits.  - The patient admits there is a room for improvement in their diet and drink choices. -  Suggestion is made for the patient to avoid simple carbohydrates from their diet including Cakes, Sweet Desserts / Pastries, Ice Cream, Soda (diet and regular), Sweet Tea, Candies, Chips, Cookies, Sweet Pastries, Store Bought Juices, Alcohol in Excess of 1-2 drinks a day, Artificial Sweeteners, Coffee Creamer, and "Sugar-free" Products. This will help patient to have stable blood glucose profile and potentially avoid unintended weight gain.   - I encouraged the patient to switch to unprocessed or minimally processed complex starch and increased protein intake (animal or plant source), fruits, and vegetables.   - Patient is advised to stick to a routine mealtimes to eat 3 meals a day and avoid unnecessary snacks (to snack only to correct hypoglycemia).  - I have approached patient with the following individualized plan to manage diabetes and patient agrees.  -She will continue to require intensive treatment with  basal/bolus insulin in order for her to achieve and maintain  control of diabetes to target.  - She is advised to increase her Lantus to 70 units SQ nightly and continue Novolog 20-26 units TID with meals if glucose is above 90 and she is eating (Specific instructions on how to titrate insulin dosage based on  glucose readings given to patient in writing). She can also continue her Metformin 500 mg ER twice daily with meals and Ozempic 1 mg SQ weekly.   She will be getting steroid injection in her hip/back soon and she is aware to reach out if glucose readings spike so we can adjust her insulin dosages.  -She is encouraged to continue monitoring blood glucose 4 times daily-using her CGM, before meals and before bed, and to call the clinic if she has readings less than 70 or greater than 300 for 3 tests in a row.  She has benefited from CGM device and is advised to continue.  2) Lipids/HPL:  Her most recent lipid panel from 01/16/22 shows controlled LDL of 91.  She was taken off her Statin due to elevated liver enzymes- which are now improving steadily.  Side effects and precautions discussed with her.    3) Hypertension:  Her blood pressure is controlled to target.  She is advised to continue Lisinopril 20 mg po daily (managed by her PCP).  4) Hypothyroidism:  -Her previsit TFTs are consistent with appropriate hormone replacement.  She is advised to continue her current dose of Levothyroxine 75 mcg po daily before breakfast (skipping 2 days out of the week).   - We discussed about the correct intake of her thyroid hormone, on empty stomach at fasting, with water, separated by at least 30 minutes from breakfast and other medications,  and separated by more than 4 hours from calcium, iron, multivitamins, acid reflux medications (PPIs). -Patient is made aware of the fact that thyroid hormone replacement is needed for life, dose to be adjusted by periodic monitoring of thyroid function tests.    - I advised patient to maintain close follow up with Anabel Halon, MD for primary care needs.     I spent  46  minutes in the care of the patient today including review of labs from CMP, Lipids, Thyroid Function, Hematology (current and previous including abstractions from other facilities); face-to-face time discussing  her blood glucose readings/logs, discussing hypoglycemia and hyperglycemia episodes and symptoms, medications doses, her options of short and long term treatment based on the latest standards of care / guidelines;  discussion about incorporating lifestyle medicine;  and documenting the encounter. Risk reduction counseling performed per USPSTF guidelines to reduce obesity and cardiovascular risk factors.     Please refer to Patient Instructions for Blood Glucose Monitoring and Insulin/Medications Dosing Guide"  in media tab for additional information. Please  also refer to " Patient Self Inventory" in the Media  tab for reviewed elements of pertinent patient history.  Murray Hodgkins participated in the discussions, expressed understanding, and voiced agreement with the above plans.  All questions were answered to her satisfaction. she is encouraged to contact clinic should she have any questions or concerns prior to her return visit.    Follow up plan: -Return in about 4 months (around 02/22/2023) for Diabetes F/U with A1c in office, No previsit labs, Bring meter and logs.  Ronny Bacon, [facility] [address] [place], [place] [address] Phone: [phone] Fax: [phone]   10/22/2022, 10:34 AM

## 2022-10-25 ENCOUNTER — Ambulatory Visit: Payer: Medicaid Other | Admitting: Family Medicine

## 2022-10-25 ENCOUNTER — Encounter: Payer: Self-pay | Admitting: Family Medicine

## 2022-10-25 VITALS — BP 138/88 | HR 75 | Ht 66.0 in | Wt 229.0 lb

## 2022-10-25 DIAGNOSIS — M79605 Pain in left leg: Secondary | ICD-10-CM | POA: Insufficient documentation

## 2022-10-25 NOTE — Patient Instructions (Addendum)
I appreciate the opportunity to provide care to you today!    Follow up: with PCP  Please f/u with Dr. Dutch Quint on 10/31/2022 and  take OTC tylenol for  breakthough pain      Please continue to a heart-healthy diet and increase your physical activities. Try to exercise for at least five days a week.      It was a pleasure to see you and I look forward to continuing to work together on your health and well-being. Please do not hesitate to call the office if you need care or have questions about your care.   Have a wonderful day and week. With Gratitude, Gilmore Laroche MSN, FNP-BC

## 2022-10-25 NOTE — Assessment & Plan Note (Signed)
Encouraged to follow-up with her neurosurgeon on 10/31/2022 Encouraged to take Tylenol as needed for breakthrough pain Encouraged to follow-up with PCP for worsening of her symptoms No neurological deficits noted on physical examination

## 2022-10-25 NOTE — Progress Notes (Signed)
Acute Office Visit  Subjective:    Patient ID: Sandy Valencia, female    DOB: [birth year], 61 y.o.   MRN: [id number]  Chief Complaint  Patient presents with   Leg Pain    Pt reports left leg pain x 3 weeks 10/04/2022. Would like to discuss MRI so she can have this done before seeing neurosurgery, (Dr. Dutch Quint).    HPI Patient is in today for with complaints of left lower extremity pain for 3 weeks.  She is following up with her neurosurgeon on 10/31/2022 and is requesting MRI.  She reports taking hydrocodone-acetaminophen 7.5-325 milligrams twice daily for pain relief.  Pain is aggravated with ambulation.  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   Anaphylactic shock, unspecified, subsequent encounter    Anemia, iron deficiency 02/14/2015   Angio-edema    Arthritis    Asthma    Asymptomatic varicose veins of right lower extremity    Chronic obstructive pulmonary disease with (acute) exacerbation (HCC)    Chronic rhinitis 05/21/2018   COVID-19    Diabetes mellitus    Essential (primary) hypertension    Family history of colon cancer 09/03/2012   Fatty liver    Gastro-esophageal reflux disease without esophagitis    GERD (gastroesophageal reflux disease)    History of colonic polyps 08/26/2017   Hypercholesteremia    Hypertension    Hypothyroidism    Low back pain    Mass of finger, right s/p surgical excision (mucoid cyst) 04/17/18 04/22/2018   Mucous cyst of digit of right hand    Obesity, unspecified    Other adverse food reactions, not elsewhere classified, initial encounter    Personal history of noncompliance with medical treatment, presenting hazards to health 05/18/2015   Rash and other nonspecific skin eruption    Sleep apnea    pt siad, "i had small amount but could not tolerate CPAP and they said it was ok since it was mild.   Sleep apnea, unspecified    Unspecified asthma,  uncomplicated    Viral URI 07/13/2022   Zoster without complications     Past Surgical History:  Procedure Laterality Date   ABDOMINAL HYSTERECTOMY     CATARACT EXTRACTION W/PHACO Left 03/09/2022   Procedure: CATARACT EXTRACTION PHACO AND INTRAOCULAR LENS PLACEMENT (IOC);  Surgeon: Fabio Pierce, MD;  Location: [facility];  Service: Ophthalmology;  Laterality: Left;  CDE 7.89   CATARACT EXTRACTION W/PHACO Right 03/23/2022   Procedure: CATARACT EXTRACTION PHACO AND INTRAOCULAR LENS PLACEMENT (IOC);  Surgeon: Fabio Pierce, MD;  Location: [facility];  Service: Ophthalmology;  Laterality: Right;  CDE: 2.63   CHOLECYSTECTOMY     COLONOSCOPY N/A 09/22/2012   RMR: colonic polyps -removed as described above. tubular adenoma, next TCS 09/2017   COLONOSCOPY WITH PROPOFOL N/A 09/23/2017   Surgeon: Corbin Ade, MD; internal hemorrhoids, three 5-6 mm polyps removed, diverticulosis in sigmoid and descending colon.  Pathology with tubular adenomas and rectal hyperplastic polyp.  Recommended 5 year surveillance.   CYST EXCISION Left 07/19/2016   Procedure: CYST REMOVAL LEFT RING FINGER;  Surgeon: Vickki Hearing, MD;  Location: [facility];  Service: Orthopedics;  Laterality: Left;   CYST REMOVAL LEG Right    foot   ESOPHAGOGASTRODUODENOSCOPY N/A 10/21/2014   RMR: Small hiatal hernia; otherwise normal EGD status post passage of a Maloney dilator.    EXCISION MASS UPPER EXTREMETIES Right 04/17/2018  Procedure: EXCISION MASS UPPER EXTREMETIES right ring finger;  Surgeon: Vickki Hearing, MD;  Location: [facility];  Service: Orthopedics;  Laterality: Right;   MALONEY DILATION N/A 10/21/2014   Procedure: Elease Hashimoto DILATION;  Surgeon: Corbin Ade, MD;  Location: [facility];  Service: Endoscopy;  Laterality: N/A;   MIDDLE EAR SURGERY Right    patched hole in ear drum   POLYPECTOMY  09/23/2017   Procedure: POLYPECTOMY;  Surgeon: Corbin Ade, MD;  Location: [facility];  Service: Endoscopy;;  polyp  hepatic flexure polyp cs, splenic flexure polyp cs, rectal polyp cs   TOTAL ABDOMINAL HYSTERECTOMY      Family History  Problem Relation Age of Onset   Colon cancer Mother        diagnosed age 41, underwent surgical resection, metastatic disease several years later   Diabetes Mother    Heart attack Father    Diabetes Sister    Hypothyroidism Brother    Diabetes Sister    Diabetes Sister    Diabetes Sister    Hypothyroidism Sister    Allergic rhinitis Neg Hx    Angioedema Neg Hx    Asthma Neg Hx    Atopy Neg Hx    Eczema Neg Hx    Immunodeficiency Neg Hx    Urticaria Neg Hx     Social History   Socioeconomic History   Marital status: Divorced    Spouse name: Not on file   Number of children: 2   Years of education: Not on file   Highest education level: Not on file  Occupational History   Not on file  Tobacco Use   Smoking status: Never   Smokeless tobacco: Never  Vaping Use   Vaping Use: Never used  Substance and Sexual Activity   Alcohol use: No    Alcohol/week: 0.0 standard drinks of alcohol   Drug use: No   Sexual activity: Yes    Birth control/protection: Surgical  Other Topics Concern   Not on file  Social History Narrative   Lives with grandson (since born)      Enjoys: vacation, travel, concerts      Diet: eats all food groups    Caffeine: diet Pepsi daily    Water:  8 cups daily       Wears seat belt    Does use phone   Smoke Retail buyer in safe area    Social Determinants of Health   Financial Resource Strain: Low Risk  (12/02/2019)   Overall Financial Resource Strain (CARDIA)    Difficulty of Paying Living Expenses: Not hard at all  Food Insecurity: No Food Insecurity (12/02/2019)   Hunger Vital Sign    Worried About Running Out of Food in the Last Year: Never true    Ran Out of Food in the Last Year: Never true  Transportation Needs: No Transportation Needs (12/02/2019)   PRAPARE - Administrator, Civil Service  (Medical): No    Lack of Transportation (Non-Medical): No  Physical Activity: Insufficiently Active (12/02/2019)   Exercise Vital Sign    Days of Exercise per Week: 2 days    Minutes of Exercise per Session: 20 min  Stress: Not on file  Social Connections: Moderately Isolated (12/02/2019)   Social Connection and Isolation Panel [NHANES]    Frequency of Communication with Friends and Family: More than three times a week    Frequency of Social Gatherings with Friends and Family: More than  three times a week    Attends Religious Services: More than 4 times per year    Active Member of Clubs or Organizations: No    Attends Banker Meetings: Never    Marital Status: Divorced  Catering manager Violence: Not At Risk (12/02/2019)   Humiliation, Afraid, Rape, and Kick questionnaire    Fear of Current or Ex-Partner: No    Emotionally Abused: No    Physically Abused: No    Sexually Abused: No    Outpatient Medications Prior to Visit  Medication Sig Dispense Refill   Accu-Chek FastClix Lancets MISC USE TO TEST 4 TIMES DAILY. 102 each 0   Accu-Chek FastClix Lancets MISC USE TO TEST 4 TIMES DAILY. 102 each 0   Accu-Chek FastClix Lancets MISC USE TO TEST 4 TIMES DAILY. 102 each 0   Accu-Chek FastClix Lancets MISC USE TO TEST 4 TIMES DAILY. 102 each 0   ACCU-CHEK GUIDE test strip USE TO TEST 4 TIMES DAILY. 100 strip 0   albuterol (VENTOLIN HFA) 108 (90 Base) MCG/ACT inhaler INHALE 4 PUFFS INTO THE LUNGS EVERY 6 HOURS AS NEEDED FOR WHEEZING OR SHORTNESS OF BREATH 18 g 0   aspirin EC 81 MG tablet Take 81 mg by mouth daily.     cetirizine (ZYRTEC) 10 MG tablet Take 1 tablet (10 mg total) by mouth daily as needed for allergies. 30 tablet 5   Cholecalciferol (VITAMIN D-3) 1000 UNITS CAPS Take 1,000 Units by mouth daily.      Continuous Blood Gluc Sensor (DEXCOM G6 SENSOR) MISC CHANGE SENSOR EVERY 10 DAYS AS DIRECTED. 3 each 2   Continuous Glucose Transmitter (DEXCOM G6 TRANSMITTER) MISC  CHANGE TRANSMITTER EVERY 90 DAYS AS DIRECTED. 1 each 0   diclofenac Sodium (VOLTAREN) 1 % GEL APPLY 2 GRAMS TO AFFECTED AREAS 2 TIMES A DAY AS NEEDED 100 g 0   EPINEPHrine 0.3 mg/0.3 mL IJ SOAJ injection Inject 0.3 mg into the muscle as needed for anaphylaxis. 2 each 1   fluticasone (FLONASE) 50 MCG/ACT nasal spray Place 1 spray into both nostrils daily. 16 g 3   HYDROcodone-acetaminophen (NORCO) 7.5-325 MG tablet Take 1 tablet by mouth 2 times daily at 12 noon and 4 pm.     insulin aspart (NOVOLOG FLEXPEN) 100 UNIT/ML FlexPen Inject 20-26 Units into the skin 3 (three) times daily with meals. 60 mL 3   ketoconazole (NIZORAL) 2 % cream APPLY TO AFFECTED AREAS TWICE DAILY 60 g 2   LANTUS SOLOSTAR 100 UNIT/ML Solostar Pen Inject 60 Units into the skin at bedtime. 60 mL 3   levothyroxine (SYNTHROID) 75 MCG tablet TAKE ONE (1) TABLET BY MOUTH EVERY DAY BEFORE BREAKFAST 90 tablet 0   lisinopril (ZESTRIL) 40 MG tablet Take 1 tablet (40 mg total) by mouth daily. 90 tablet 3   LITETOUCH PEN NEEDLES 31G X 8 MM MISC USE AS DIRECTED FOUR TIMES DAILY. 100 each 0   metFORMIN (GLUCOPHAGE-XR) 500 MG 24 hr tablet TAKE ONE TABLET BY MOUTH TWICE A DAY AFTER A MEAL 180 tablet 0   ondansetron (ZOFRAN) 4 MG tablet Take 1 tablet (4 mg total) by mouth every 6 (six) hours. 12 tablet 0   OZEMPIC, 1 MG/DOSE, 4 MG/3ML SOPN INJECT 1MG  ONCE A WEEK AS DIRECTED 9 mL 0   pantoprazole (PROTONIX) 40 MG tablet Take 1 tablet (40 mg total) by mouth daily. 30 tablet 11   pregabalin (LYRICA) 100 MG capsule Take 100 mg by mouth 2 (two) times daily.  Sod Picosulfate-Mag Ox-Cit Acd (CLENPIQ) 10-3.5-12 MG-GM -GM/175ML SOLN Take 1 kit by mouth as directed. 350 mL 0   vitamin B-12 (CYANOCOBALAMIN) 500 MCG tablet Take 500 mcg by mouth daily.     No facility-administered medications prior to visit.    Allergies  Allergen Reactions   Kiwi Extract Anaphylaxis, Swelling and Palpitations   Other Anaphylaxis    Walnuts (avoids all tree  nuts)   Statins     Transaminase elevation    Review of Systems  Constitutional:  Negative for chills and fever.  Eyes:  Negative for visual disturbance.  Respiratory:  Negative for chest tightness and shortness of breath.   Musculoskeletal:        Left leg pain  Neurological:  Negative for dizziness and headaches.       Objective:    Physical Exam HENT:     Head: Normocephalic.     Mouth/Throat:     Mouth: Mucous membranes are moist.  Cardiovascular:     Rate and Rhythm: Normal rate.     Heart sounds: Normal heart sounds.  Pulmonary:     Effort: Pulmonary effort is normal.     Breath sounds: Normal breath sounds.  Musculoskeletal:     Comments: No swelling, or deformity seen Sensation is intact Mild tenderness with palpation of the left buttocks Gait intact  Neurological:     Mental Status: She is alert.     BP 138/88 (BP Location: Left Arm)   Pulse 75   Ht 5\' 6"  (1.676 m)   Wt 229 lb 0.6 oz (103.9 kg)   SpO2 94%   BMI 36.97 kg/m  Wt Readings from Last 3 Encounters:  10/25/22 229 lb 0.6 oz (103.9 kg)  10/22/22 230 lb 12.8 oz (104.7 kg)  09/28/22 227 lb 12.8 oz (103.3 kg)       Assessment & Plan:  Acute pain of left lower extremity Assessment & Plan: Encouraged to follow-up with her neurosurgeon on 10/31/2022 Encouraged to take Tylenol as needed for breakthrough pain Encouraged to follow-up with PCP for worsening of her symptoms No neurological deficits noted on physical examination     Gilmore Laroche, FNP

## 2022-10-31 DIAGNOSIS — M5416 Radiculopathy, lumbar region: Secondary | ICD-10-CM | POA: Diagnosis not present

## 2022-10-31 DIAGNOSIS — Z6838 Body mass index (BMI) 38.0-38.9, adult: Secondary | ICD-10-CM | POA: Diagnosis not present

## 2022-11-05 NOTE — Patient Instructions (Addendum)
Sandy Valencia  11/05/2022     @PREFPERIOPPHARMACY @   Your procedure is scheduled on  11/08/2022.   Report to [facility] at  [phone]  A.M.   Call this number if you have problems the morning of surgery:  [phone]  If you experience any cold or flu symptoms such as cough, fever, chills, shortness of breath, etc. between now and your scheduled surgery, please notify us at the above number.   Remember:  Follow the diet and prep instructions given to you by the office.      Your last dose of Ozempic should have been on 10/31/2022 or before.     Take 35 units of your lantus the night before your procedure.      DO NOT take any medications for diabetes the morning of your procedure.   Use your inhaler before you come and bring your rescue inhaler with you.    Take these medicines the morning of surgery with A SIP OF WATER          zyrtec,lyrica, hydrocodone(if needed), levothyroxine, zofran (if needed), pantoprazole.     Do not wear jewelry, make-up or nail polish, including gel polish,  artificial nails, or any other type of covering on natural nails (fingers and  toes).  Do not wear lotions, powders, or perfumes, or deodorant.  Do not shave 48 hours prior to surgery.  Men may shave face and neck.  Do not bring valuables to the hospital.  [facility] is not responsible for any belongings or valuables.  Contacts, dentures or bridgework may not be worn into surgery.  Leave your suitcase in the car.  After surgery it may be brought to your room.  For patients admitted to the hospital, discharge time will be determined by your treatment team.  Patients discharged the day of surgery will not be allowed to drive home and must have someone with them for 24 hours.    Special instructions:   DO NOT smoke tobacco or vape for 24 hours before your procedure.  Please read over the following fact sheets that you were given. Anesthesia Post-op Instructions and Care and  Recovery After Surgery       Upper Endoscopy, Adult, Care After After the procedure, it is common to have a sore throat. It is also common to have: Mild stomach pain or discomfort. Bloating. Nausea. Follow these instructions at home: The instructions below may help you care for yourself at home. Your health care provider may give you more instructions. If you have questions, ask your health care provider. If you were given a sedative during the procedure, it can affect you for several hours. Do not drive or operate machinery until your health care provider says that it is safe. If you will be going home right after the procedure, plan to have a responsible adult: Take you home from the hospital or clinic. You will not be allowed to drive. Care for you for the time you are told. Follow instructions from your health care provider about what you may eat and drink. Return to your normal activities as told by your health care provider. Ask your health care provider what activities are safe for you. Take over-the-counter and prescription medicines only as told by your health care provider. Contact a health care provider if you: Have a sore throat that lasts longer than one day. Have trouble swallowing. Have a fever. Get help right away  if you: Vomit blood or your vomit looks like coffee grounds. Have bloody, black, or tarry stools. Have a very bad sore throat or you cannot swallow. Have difficulty breathing or very bad pain in your chest or abdomen. These symptoms may be an emergency. Get help right away. Call 911. Do not wait to see if the symptoms will go away. Do not drive yourself to the hospital. Summary After the procedure, it is common to have a sore throat, mild stomach discomfort, bloating, and nausea. If you were given a sedative during the procedure, it can affect you for several hours. Do not drive until your health care provider says that it is safe. Follow instructions from  your health care provider about what you may eat and drink. Return to your normal activities as told by your health care provider. This information is not intended to replace advice given to you by your health care provider. Make sure you discuss any questions you have with your health care provider. Document Revised: 08/30/2021 Document Reviewed: 08/30/2021 Elsevier Patient Education  2024 [employer] Esophageal Dilatation Esophageal dilatation, also called esophageal dilation, is a procedure to widen or open a blocked or narrowed part of the esophagus. The esophagus is the part of the body that moves food and liquid from the mouth to the stomach. You may need this procedure if: You have a buildup of scar tissue in your esophagus that makes it difficult, painful, or impossible to swallow. This can be caused by gastroesophageal reflux disease (GERD). You have cancer of the esophagus. There is a problem with how food moves through your esophagus. In some cases, you may need this procedure repeated at a later time to dilate the esophagus gradually. Tell a health care provider about: Any allergies you have. All medicines you are taking, including vitamins, herbs, eye drops, creams, and over-the-counter medicines. Any problems you or family members have had with anesthetic medicines. Any blood disorders you have. Any surgeries you have had. Any medical conditions you have. Any antibiotic medicines you are required to take before dental procedures. Whether you are pregnant or may be pregnant. What are the risks? Generally, this is a safe procedure. However, problems may occur, including: Bleeding due to a tear in the lining of the esophagus. A hole, or perforation, in the esophagus. What happens before the procedure? Ask your health care provider about: Changing or stopping your regular medicines. This is especially important if you are taking diabetes medicines or blood thinners. Taking  medicines such as aspirin and ibuprofen. These medicines can thin your blood. Do not take these medicines unless your health care provider tells you to take them. Taking over-the-counter medicines, vitamins, herbs, and supplements. Follow instructions from your health care provider about eating or drinking restrictions. Plan to have a responsible adult take you home from the hospital or clinic. Plan to have a responsible adult care for you for the time you are told after you leave the hospital or clinic. This is important. What happens during the procedure? You may be given a medicine to help you relax (sedative). A numbing medicine may be sprayed into the back of your throat, or you may gargle the medicine. Your health care provider may perform the dilatation using various surgical instruments, such as: Simple dilators. This instrument is carefully placed in the esophagus to stretch it. Guided wire bougies. This involves using an endoscope to insert a wire into the esophagus. A dilator is passed over this wire to enlarge  the esophagus. Then the wire is removed. Balloon dilators. An endoscope with a small balloon is inserted into the esophagus. The balloon is inflated to stretch the esophagus and open it up. The procedure may vary among health care providers and hospitals. What can I expect after the procedure? Your blood pressure, heart rate, breathing rate, and blood oxygen level will be monitored until you leave the hospital or clinic. Your throat may feel slightly sore and numb. This will get better over time. You will not be allowed to eat or drink until your throat is no longer numb. When you are able to drink, urinate, and sit on the edge of the bed without nausea or dizziness, you may be able to return home. Follow these instructions at home: Take over-the-counter and prescription medicines only as told by your health care provider. If you were given a sedative during the procedure, it  can affect you for several hours. Do not drive or operate machinery until your health care provider says that it is safe. Plan to have a responsible adult care for you for the time you are told. This is important. Follow instructions from your health care provider about any eating or drinking restrictions. Do not use any products that contain nicotine or tobacco, such as cigarettes, e-cigarettes, and chewing tobacco. If you need help quitting, ask your health care provider. Keep all follow-up visits. This is important. Contact a health care provider if: You have a fever. You have pain that is not relieved by medicine. Get help right away if: You have chest pain. You have trouble breathing. You have trouble swallowing. You vomit blood. You have black, tarry, or bloody stools. These symptoms may represent a serious problem that is an emergency. Do not wait to see if the symptoms will go away. Get medical help right away. Call your local emergency services (911 in the [place] Do not drive yourself to the hospital. Summary Esophageal dilatation, also called esophageal dilation, is a procedure to widen or open a blocked or narrowed part of the esophagus. Plan to have a responsible adult take you home from the hospital or clinic. For this procedure, a numbing medicine may be sprayed into the back of your throat, or you may gargle the medicine. Do not drive or operate machinery until your health care provider says that it is safe. This information is not intended to replace advice given to you by your health care provider. Make sure you discuss any questions you have with your health care provider. Document Revised: 10/07/2019 Document Reviewed: 10/07/2019 Elsevier Patient Education  2024 [employer] Colonoscopy, Adult, Care After The following information offers guidance on how to care for yourself after your procedure. Your health care provider may also give you more specific instructions. If  you have problems or questions, contact your health care provider. What can I expect after the procedure? After the procedure, it is common to have: A small amount of blood in your stool for 24 hours after the procedure. Some gas. Mild cramping or bloating of your abdomen. Follow these instructions at home: Eating and drinking  Drink enough fluid to keep your urine pale yellow. Follow instructions from your health care provider about eating or drinking restrictions. Resume your normal diet as told by your health care provider. Avoid heavy or fried foods that are hard to digest. Activity Rest as told by your health care provider. Avoid sitting for a long time without moving. Get up to take short walks every  1-2 hours. This is important to improve blood flow and breathing. Ask for help if you feel weak or unsteady. Return to your normal activities as told by your health care provider. Ask your health care provider what activities are safe for you. Managing cramping and bloating  Try walking around when you have cramps or feel bloated. If directed, apply heat to your abdomen as told by your health care provider. Use the heat source that your health care provider recommends, such as a moist heat pack or a heating pad. Place a towel between your skin and the heat source. Leave the heat on for 20-30 minutes. Remove the heat if your skin turns bright red. This is especially important if you are unable to feel pain, heat, or cold. You have a greater risk of getting burned. General instructions If you were given a sedative during the procedure, it can affect you for several hours. Do not drive or operate machinery until your health care provider says that it is safe. For the first 24 hours after the procedure: Do not sign important documents. Do not drink alcohol. Do your regular daily activities at a slower pace than normal. Eat soft foods that are easy to digest. Take over-the-counter and  prescription medicines only as told by your health care provider. Keep all follow-up visits. This is important. Contact a health care provider if: You have blood in your stool 2-3 days after the procedure. Get help right away if: You have more than a small spotting of blood in your stool. You have large blood clots in your stool. You have swelling of your abdomen. You have nausea or vomiting. You have a fever. You have increasing pain in your abdomen that is not relieved with medicine. These symptoms may be an emergency. Get help right away. Call 911. Do not wait to see if the symptoms will go away. Do not drive yourself to the hospital. Summary After the procedure, it is common to have a small amount of blood in your stool. You may also have mild cramping and bloating of your abdomen. If you were given a sedative during the procedure, it can affect you for several hours. Do not drive or operate machinery until your health care provider says that it is safe. Get help right away if you have a lot of blood in your stool, nausea or vomiting, a fever, or increased pain in your abdomen. This information is not intended to replace advice given to you by your health care provider. Make sure you discuss any questions you have with your health care provider. Document Revised: 07/03/2022 Document Reviewed: 01/11/2021 Elsevier Patient Education  2024 [employer] Monitored Anesthesia Care, Care After The following information offers guidance on how to care for yourself after your procedure. Your health care provider may also give you more specific instructions. If you have problems or questions, contact your health care provider. What can I expect after the procedure? After the procedure, it is common to have: Tiredness. Little or no memory about what happened during or after the procedure. Impaired judgment when it comes to making decisions. Nausea or vomiting. Some trouble with balance. Follow  these instructions at home: For the time period you were told by your health care provider:  Rest. Do not participate in activities where you could fall or become injured. Do not drive or use machinery. Do not drink alcohol. Do not take sleeping pills or medicines that cause drowsiness. Do not make important decisions or  sign legal documents. Do not take care of children on your own. Medicines Take over-the-counter and prescription medicines only as told by your health care provider. If you were prescribed antibiotics, take them as told by your health care provider. Do not stop using the antibiotic even if you start to feel better. Eating and drinking Follow instructions from your health care provider about what you may eat and drink. Drink enough fluid to keep your urine pale yellow. If you vomit: Drink clear fluids slowly and in small amounts as you are able. Clear fluids include water, ice chips, low-calorie sports drinks, and fruit juice that has water added to it (diluted fruit juice). Eat light and bland foods in small amounts as you are able. These foods include bananas, applesauce, rice, lean meats, toast, and crackers. General instructions  Have a responsible adult stay with you for the time you are told. It is important to have someone help care for you until you are awake and alert. If you have sleep apnea, surgery and some medicines can increase your risk for breathing problems. Follow instructions from your health care provider about wearing your sleep device: When you are sleeping. This includes during daytime naps. While taking prescription pain medicines, sleeping medicines, or medicines that make you drowsy. Do not use any products that contain nicotine or tobacco. These products include cigarettes, chewing tobacco, and vaping devices, such as e-cigarettes. If you need help quitting, ask your health care provider. Contact a health care provider if: You feel nauseous or  vomit every time you eat or drink. You feel light-headed. You are still sleepy or having trouble with balance after 24 hours. You get a rash. You have a fever. You have redness or swelling around the IV site. Get help right away if: You have trouble breathing. You have new confusion after you get home. These symptoms may be an emergency. Get help right away. Call 911. Do not wait to see if the symptoms will go away. Do not drive yourself to the hospital. This information is not intended to replace advice given to you by your health care provider. Make sure you discuss any questions you have with your health care provider. Document Revised: 10/16/2021 Document Reviewed: 10/16/2021 Elsevier Patient Education  2024 [employer].

## 2022-11-06 ENCOUNTER — Other Ambulatory Visit: Payer: Self-pay

## 2022-11-06 ENCOUNTER — Encounter (HOSPITAL_COMMUNITY): Payer: Self-pay

## 2022-11-06 ENCOUNTER — Encounter (HOSPITAL_COMMUNITY)
Admission: RE | Admit: 2022-11-06 | Discharge: 2022-11-06 | Disposition: A | Discharge status: Home / Self Care | Payer: Medicaid Other | Source: Ambulatory Visit | Attending: Internal Medicine | Admitting: Internal Medicine

## 2022-11-06 VITALS — BP 138/88 | HR 75 | Temp 97.7°F | Resp 18 | Ht 66.0 in | Wt 229.0 lb

## 2022-11-06 DIAGNOSIS — M545 Low back pain, unspecified: Secondary | ICD-10-CM | POA: Diagnosis not present

## 2022-11-06 DIAGNOSIS — G894 Chronic pain syndrome: Secondary | ICD-10-CM | POA: Diagnosis not present

## 2022-11-06 DIAGNOSIS — E1165 Type 2 diabetes mellitus with hyperglycemia: Secondary | ICD-10-CM | POA: Insufficient documentation

## 2022-11-06 DIAGNOSIS — Z01810 Encounter for preprocedural cardiovascular examination: Secondary | ICD-10-CM | POA: Insufficient documentation

## 2022-11-06 DIAGNOSIS — I10 Essential (primary) hypertension: Secondary | ICD-10-CM

## 2022-11-06 DIAGNOSIS — Z794 Long term (current) use of insulin: Secondary | ICD-10-CM | POA: Insufficient documentation

## 2022-11-06 DIAGNOSIS — Z79899 Other long term (current) drug therapy: Secondary | ICD-10-CM | POA: Diagnosis not present

## 2022-11-06 DIAGNOSIS — M5116 Intervertebral disc disorders with radiculopathy, lumbar region: Secondary | ICD-10-CM | POA: Diagnosis not present

## 2022-11-06 NOTE — Progress Notes (Signed)
PAT visit completed. Pt verbalized understanding on procedure instructions and arrival time.   

## 2022-11-07 DIAGNOSIS — M47816 Spondylosis without myelopathy or radiculopathy, lumbar region: Secondary | ICD-10-CM | POA: Diagnosis not present

## 2022-11-07 DIAGNOSIS — M5126 Other intervertebral disc displacement, lumbar region: Secondary | ICD-10-CM | POA: Diagnosis not present

## 2022-11-07 DIAGNOSIS — M5416 Radiculopathy, lumbar region: Secondary | ICD-10-CM | POA: Diagnosis not present

## 2022-11-08 ENCOUNTER — Ambulatory Visit (HOSPITAL_COMMUNITY)
Admission: RE | Admit: 2022-11-08 | Discharge: 2022-11-08 | Disposition: A | Discharge status: Home / Self Care | Payer: Medicaid Other | Attending: Internal Medicine | Admitting: Internal Medicine

## 2022-11-08 ENCOUNTER — Encounter (HOSPITAL_COMMUNITY): Payer: Self-pay | Admitting: Internal Medicine

## 2022-11-08 ENCOUNTER — Other Ambulatory Visit: Payer: Self-pay | Admitting: Nurse Practitioner

## 2022-11-08 ENCOUNTER — Ambulatory Visit (HOSPITAL_BASED_OUTPATIENT_CLINIC_OR_DEPARTMENT_OTHER): Payer: Medicaid Other | Admitting: Certified Registered"

## 2022-11-08 ENCOUNTER — Ambulatory Visit (HOSPITAL_COMMUNITY): Payer: Medicaid Other | Admitting: Certified Registered"

## 2022-11-08 ENCOUNTER — Encounter (HOSPITAL_COMMUNITY)
Admission: RE | Disposition: A | Discharge status: Home / Self Care | Payer: Self-pay | Source: Home / Self Care | Attending: Internal Medicine

## 2022-11-08 DIAGNOSIS — E119 Type 2 diabetes mellitus without complications: Secondary | ICD-10-CM | POA: Insufficient documentation

## 2022-11-08 DIAGNOSIS — K2289 Other specified disease of esophagus: Secondary | ICD-10-CM

## 2022-11-08 DIAGNOSIS — E039 Hypothyroidism, unspecified: Secondary | ICD-10-CM | POA: Insufficient documentation

## 2022-11-08 DIAGNOSIS — D123 Benign neoplasm of transverse colon: Secondary | ICD-10-CM

## 2022-11-08 DIAGNOSIS — K5730 Diverticulosis of large intestine without perforation or abscess without bleeding: Secondary | ICD-10-CM | POA: Diagnosis not present

## 2022-11-08 DIAGNOSIS — G4730 Sleep apnea, unspecified: Secondary | ICD-10-CM | POA: Insufficient documentation

## 2022-11-08 DIAGNOSIS — D638 Anemia in other chronic diseases classified elsewhere: Secondary | ICD-10-CM

## 2022-11-08 DIAGNOSIS — K649 Unspecified hemorrhoids: Secondary | ICD-10-CM

## 2022-11-08 DIAGNOSIS — K219 Gastro-esophageal reflux disease without esophagitis: Secondary | ICD-10-CM | POA: Diagnosis not present

## 2022-11-08 DIAGNOSIS — K317 Polyp of stomach and duodenum: Secondary | ICD-10-CM

## 2022-11-08 DIAGNOSIS — Z7984 Long term (current) use of oral hypoglycemic drugs: Secondary | ICD-10-CM | POA: Insufficient documentation

## 2022-11-08 DIAGNOSIS — D124 Benign neoplasm of descending colon: Secondary | ICD-10-CM | POA: Diagnosis not present

## 2022-11-08 DIAGNOSIS — J4489 Other specified chronic obstructive pulmonary disease: Secondary | ICD-10-CM | POA: Insufficient documentation

## 2022-11-08 DIAGNOSIS — I10 Essential (primary) hypertension: Secondary | ICD-10-CM | POA: Diagnosis not present

## 2022-11-08 DIAGNOSIS — Z86010 Personal history of colonic polyps: Secondary | ICD-10-CM

## 2022-11-08 DIAGNOSIS — E1165 Type 2 diabetes mellitus with hyperglycemia: Secondary | ICD-10-CM

## 2022-11-08 DIAGNOSIS — Z800 Family history of malignant neoplasm of digestive organs: Secondary | ICD-10-CM | POA: Diagnosis not present

## 2022-11-08 DIAGNOSIS — K635 Polyp of colon: Secondary | ICD-10-CM | POA: Diagnosis not present

## 2022-11-08 DIAGNOSIS — R1310 Dysphagia, unspecified: Secondary | ICD-10-CM

## 2022-11-08 DIAGNOSIS — Z1211 Encounter for screening for malignant neoplasm of colon: Secondary | ICD-10-CM | POA: Diagnosis not present

## 2022-11-08 DIAGNOSIS — K640 First degree hemorrhoids: Secondary | ICD-10-CM

## 2022-11-08 DIAGNOSIS — J449 Chronic obstructive pulmonary disease, unspecified: Secondary | ICD-10-CM | POA: Diagnosis not present

## 2022-11-08 DIAGNOSIS — Z09 Encounter for follow-up examination after completed treatment for conditions other than malignant neoplasm: Secondary | ICD-10-CM | POA: Insufficient documentation

## 2022-11-08 DIAGNOSIS — K5790 Diverticulosis of intestine, part unspecified, without perforation or abscess without bleeding: Secondary | ICD-10-CM

## 2022-11-08 HISTORY — PX: COLONOSCOPY WITH PROPOFOL: SHX5780

## 2022-11-08 HISTORY — PX: BIOPSY: SHX5522

## 2022-11-08 HISTORY — PX: POLYPECTOMY: SHX5525

## 2022-11-08 HISTORY — PX: ESOPHAGOGASTRODUODENOSCOPY (EGD) WITH PROPOFOL: SHX5813

## 2022-11-08 HISTORY — PX: MALONEY DILATION: SHX5535

## 2022-11-08 LAB — GLUCOSE, CAPILLARY: Glucose-Capillary: 162 mg/dL — ABNORMAL HIGH (ref 70–99)

## 2022-11-08 SURGERY — COLONOSCOPY WITH PROPOFOL
Anesthesia: General

## 2022-11-08 MED ORDER — PHENYLEPHRINE 80 MCG/ML (10ML) SYRINGE FOR IV PUSH (FOR BLOOD PRESSURE SUPPORT)
PREFILLED_SYRINGE | INTRAVENOUS | Status: DC | PRN
  Administered 2022-11-08 (×2): 160 ug via INTRAVENOUS

## 2022-11-08 MED ORDER — PHENYLEPHRINE 80 MCG/ML (10ML) SYRINGE FOR IV PUSH (FOR BLOOD PRESSURE SUPPORT)
PREFILLED_SYRINGE | INTRAVENOUS | Status: AC
  Filled 2022-11-08: qty 10

## 2022-11-08 MED ORDER — LACTATED RINGERS IV SOLN: INTRAVENOUS | Status: DC

## 2022-11-08 MED ORDER — EPHEDRINE SULFATE-NACL 50-0.9 MG/10ML-% IV SOSY
PREFILLED_SYRINGE | INTRAVENOUS | Status: DC | PRN
  Administered 2022-11-08: 5 mg via INTRAVENOUS

## 2022-11-08 MED ORDER — EPHEDRINE 5 MG/ML INJ
INTRAVENOUS | Status: AC
  Filled 2022-11-08: qty 5

## 2022-11-08 MED ORDER — LIDOCAINE HCL (CARDIAC) PF 100 MG/5ML IV SOSY
PREFILLED_SYRINGE | INTRAVENOUS | Status: DC | PRN
  Administered 2022-11-08: 50 mg via INTRAVENOUS

## 2022-11-08 MED ORDER — PROPOFOL 500 MG/50ML IV EMUL
INTRAVENOUS | Status: DC | PRN
  Administered 2022-11-08: 150 ug/kg/min via INTRAVENOUS

## 2022-11-08 MED ORDER — STERILE WATER FOR IRRIGATION IR SOLN
Status: DC | PRN
  Administered 2022-11-08: 60 mL

## 2022-11-08 MED ORDER — PROPOFOL 10 MG/ML IV BOLUS
INTRAVENOUS | Status: DC | PRN
  Administered 2022-11-08: 100 mg via INTRAVENOUS

## 2022-11-08 NOTE — Anesthesia Procedure Notes (Signed)
Date/Time: 11/08/2022 7:50 AM  Performed by: Julian Reil, CRNAPre-anesthesia Checklist: Patient identified, Emergency Drugs available, Suction available and Patient being monitored Patient Re-evaluated:Patient Re-evaluated prior to induction Oxygen Delivery Method: Nasal cannula Induction Type: IV induction Placement Confirmation: positive ETCO2

## 2022-11-08 NOTE — Anesthesia Preprocedure Evaluation (Signed)
Anesthesia Evaluation  Patient identified by MRN, date of birth, ID band Patient awake    Reviewed: Allergy & Precautions, H&P , NPO status , Patient's Chart, lab work & pertinent test results, reviewed documented beta blocker date and time   Airway Mallampati: II  TM Distance: >3 FB Neck ROM: full    Dental no notable dental hx.    Pulmonary neg pulmonary ROS, asthma , sleep apnea , COPD   Pulmonary exam normal breath sounds clear to auscultation       Cardiovascular Exercise Tolerance: Good hypertension, negative cardio ROS  Rhythm:regular Rate:Normal     Neuro/Psych  Neuromuscular disease negative neurological ROS  negative psych ROS   GI/Hepatic negative GI ROS, Neg liver ROS, hiatal hernia,GERD  ,,  Endo/Other  negative endocrine ROSdiabetesHypothyroidism    Renal/GU negative Renal ROS  negative genitourinary   Musculoskeletal   Abdominal   Peds  Hematology negative hematology ROS (+) Blood dyscrasia, anemia   Anesthesia Other Findings   Reproductive/Obstetrics negative OB ROS                             Anesthesia Physical Anesthesia Plan  ASA: 3  Anesthesia Plan: General   Post-op Pain Management:    Induction:   PONV Risk Score and Plan: Propofol infusion  Airway Management Planned:   Additional Equipment:   Intra-op Plan:   Post-operative Plan:   Informed Consent: I have reviewed the patients History and Physical, chart, labs and discussed the procedure including the risks, benefits and alternatives for the proposed anesthesia with the patient or authorized representative who has indicated his/her understanding and acceptance.     Dental Advisory Given  Plan Discussed with: CRNA  Anesthesia Plan Comments:        Anesthesia Quick Evaluation

## 2022-11-08 NOTE — Transfer of Care (Signed)
Immediate Anesthesia Transfer of Care Note  Patient: Sandy Valencia  Procedure(s) Performed: COLONOSCOPY WITH PROPOFOL ESOPHAGOGASTRODUODENOSCOPY (EGD) WITH PROPOFOL MALONEY DILATION BIOPSY POLYPECTOMY  Patient Location: Short Stay  Anesthesia Type:General  Level of Consciousness: awake  Airway & Oxygen Therapy: Patient Spontanous Breathing  Post-op Assessment: Report given to RN and Post -op Vital signs reviewed and stable  Post vital signs: Reviewed and stable  Last Vitals:  Vitals Value Taken Time  BP 103/49 11/08/22 0819  Temp 36.4 C 11/08/22 0819  Pulse 72 11/08/22 0819  Resp 15 11/08/22 0819  SpO2 100 % 11/08/22 0819    Last Pain:  Vitals:   11/08/22 0819  TempSrc: Oral  PainSc: 0-No pain      Patients Stated Pain Goal: 5 (11/08/22 0701)  Complications: No notable events documented.

## 2022-11-08 NOTE — Op Note (Signed)
[facility] Patient Name: Sandy Valencia Procedure Date: 11/08/2022 7:30 AM MRN: [id number] Date of Birth: 17-Jul-1960 Attending MD: Gennette Pac , MD, [id number] CSN: [id number] Age: 62 Admit Type: Inpatient Procedure:                Upper GI endoscopy Indications:              Dysphagia Providers:                Gennette Pac, MD, Angelica Ran, Zena Amos, Dyann Ruddle Referring MD:              Medicines:                Propofol per Anesthesia Complications:            No immediate complications. Estimated Blood Loss:     Estimated blood loss was minimal. Procedure:                Pre-Anesthesia Assessment:                           - Prior to the procedure, a History and Physical                            was performed, and patient medications and                            allergies were reviewed. The patient's tolerance of                            previous anesthesia was also reviewed. The risks                            and benefits of the procedure and the sedation                            options and risks were discussed with the patient.                            All questions were answered, and informed consent                            was obtained. Prior Anticoagulants: The patient has                            taken no anticoagulant or antiplatelet agents. ASA                            Grade Assessment: III - A patient with severe                            systemic disease. After reviewing the risks and  benefits, the patient was deemed in satisfactory                            condition to undergo the procedure.                           After obtaining informed consent, the endoscope was                            passed under direct vision. Throughout the                            procedure, the patient's blood pressure, pulse, and                            oxygen saturations were  monitored continuously. The                            [id number]-H190 ([id number]) scope was introduced through the                            mouth, and advanced to the second part of duodenum.                            The upper GI endoscopy was accomplished without                            difficulty. The patient tolerated the procedure                            well. Scope In: 7:49:10 AM Scope Out: 7:53:46 AM Total Procedure Duration: 0 hours 4 minutes 36 seconds  Findings:      The examined esophagus was normal. Gastric cavity empty.      The entire examined stomach was normal aside from a couple of 3 to 5 mm       hyperplastic appearing polyps. Pylorus patent..      The duodenal bulb and second portion of the duodenum were normal. The       scope was withdrawn. Dilation was performed with a Maloney dilator with       mild resistance at 56 Fr. The dilation site was examined following       endoscope reinsertion and showed no change. Estimated blood loss: none.       Finally, one of the gastric polyps was removed with cold biopsy forceps. Impression:               -                           - Normal esophagus -dilated..                           - Normal stomach.                           - Normal duodenal bulb and second portion of the  duodenum.                           - No specimens collected. Moderate Sedation:      Moderate (conscious) sedation was personally administered by an       anesthesia professional. The following parameters were monitored: oxygen       saturation, heart rate, blood pressure, respiratory rate, EKG, adequacy       of pulmonary ventilation, and response to care. Recommendation:           - Patient has a contact number available for                            emergencies. The signs and symptoms of potential                            delayed complications were discussed with the                            patient. Return to normal  activities tomorrow.                            Written discharge instructions were provided to the                            patient.                           - Advance diet as tolerated.                           - Continue present medications. Office visit 1                            year. See colonoscopy report. Procedure Code(s):        --- Professional ---                           [phone], Esophagogastroduodenoscopy, flexible,                            transoral; diagnostic, including collection of                            specimen(s) by brushing or washing, when performed                            (separate procedure) Diagnosis Code(s):        --- Professional ---                           R13.10, Dysphagia, unspecified CPT copyright 2022 [employer]. All rights reserved. The codes documented in this report are preliminary and upon coder review may  be revised to meet current compliance requirements. Gerrit Friends. Suly Vukelich, MD Gennette Pac, MD 11/08/2022 8:17:19 AM This report has been signed electronically. Number of Addenda: 0

## 2022-11-08 NOTE — Discharge Instructions (Signed)
EGD Discharge instructions Please read the instructions outlined below and refer to this sheet in the next few weeks. These discharge instructions provide you with general information on caring for yourself after you leave the hospital. Your doctor may also give you specific instructions. While your treatment has been planned according to the most current medical practices available, unavoidable complications occasionally occur. If you have any problems or questions after discharge, please call your doctor. ACTIVITY You may resume your regular activity but move at a slower pace for the next 24 hours.  Take frequent rest periods for the next 24 hours.  Walking will help expel (get rid of) the air and reduce the bloated feeling in your abdomen.  No driving for 24 hours (because of the anesthesia (medicine) used during the test).  You may shower.  Do not sign any important legal documents or operate any machinery for 24 hours (because of the anesthesia used during the test).  NUTRITION Drink plenty of fluids.  You may resume your normal diet.  Begin with a light meal and progress to your normal diet.  Avoid alcoholic beverages for 24 hours or as instructed by your caregiver.  MEDICATIONS You may resume your normal medications unless your caregiver tells you otherwise.  WHAT YOU CAN EXPECT TODAY You may experience abdominal discomfort such as a feeling of fullness or "gas" pains.  FOLLOW-UP Your doctor will discuss the results of your test with you.  SEEK IMMEDIATE MEDICAL ATTENTION IF ANY OF THE FOLLOWING OCCUR: Excessive nausea (feeling sick to your stomach) and/or vomiting.  Severe abdominal pain and distention (swelling).  Trouble swallowing.  Temperature over 101 F (37.8 C).  Rectal bleeding or vomiting of blood.    Colonoscopy Discharge Instructions  Read the instructions outlined below and refer to this sheet in the next few weeks. These discharge instructions provide you with  general information on caring for yourself after you leave the hospital. Your doctor may also give you specific instructions. While your treatment has been planned according to the most current medical practices available, unavoidable complications occasionally occur. If you have any problems or questions after discharge, call Dr. Jena Gauss at [phone]. ACTIVITY You may resume your regular activity, but move at a slower pace for the next 24 hours.  Take frequent rest periods for the next 24 hours.  Walking will help get rid of the air and reduce the bloated feeling in your belly (abdomen).  No driving for 24 hours (because of the medicine (anesthesia) used during the test).   Do not sign any important legal documents or operate any machinery for 24 hours (because of the anesthesia used during the test).  NUTRITION Drink plenty of fluids.  You may resume your normal diet as instructed by your doctor.  Begin with a light meal and progress to your normal diet. Heavy or fried foods are harder to digest and may make you feel sick to your stomach (nauseated).  Avoid alcoholic beverages for 24 hours or as instructed.  MEDICATIONS You may resume your normal medications unless your doctor tells you otherwise.  WHAT YOU CAN EXPECT TODAY Some feelings of bloating in the abdomen.  Passage of more gas than usual.  Spotting of blood in your stool or on the toilet paper.  IF YOU HAD POLYPS REMOVED DURING THE COLONOSCOPY: No aspirin products for 7 days or as instructed.  No alcohol for 7 days or as instructed.  Eat a soft diet for the next 24 hours.  FINDING  OUT THE RESULTS OF YOUR TEST Not all test results are available during your visit. If your test results are not back during the visit, make an appointment with your caregiver to find out the results. Do not assume everything is normal if you have not heard from your caregiver or the medical facility. It is important for you to follow up on all of your test  results.  SEEK IMMEDIATE MEDICAL ATTENTION IF: You have more than a spotting of blood in your stool.  Your belly is swollen (abdominal distention).  You are nauseated or vomiting.  You have a temperature over 101.  You have abdominal pain or discomfort that is severe or gets worse throughout the day.      Your esophagus was nicely stretched today  You had 2 polyps in your colon -  removed   office visit with [place] in 1 year  Further recommendations to follow once pathology report returns on the polyps.  At patient request,  Omar Person at [phone] answer.

## 2022-11-08 NOTE — Op Note (Signed)
[facility] Patient Name: Sandy Valencia Procedure Date: 11/08/2022 7:56 AM MRN: [id number] Date of Birth: 07-Feb-1961 Attending MD: Gennette Pac , MD, [id number] CSN: [id number] Age: 62 Admit Type: Outpatient Procedure:                Colonoscopy Indications:              High risk colon cancer surveillance: Personal                            history of colonic polyps Providers:                Gennette Pac, MD, Angelica Ran, Zena Amos Referring MD:              Medicines:                Propofol per Anesthesia Complications:            No immediate complications. Estimated Blood Loss:     Estimated blood loss was minimal. Procedure:                Pre-Anesthesia Assessment:                           - Prior to the procedure, a History and Physical                            was performed, and patient medications and                            allergies were reviewed. The patient's tolerance of                            previous anesthesia was also reviewed. The risks                            and benefits of the procedure and the sedation                            options and risks were discussed with the patient.                            All questions were answered, and informed consent                            was obtained. Prior Anticoagulants: The patient has                            taken no anticoagulant or antiplatelet agents. ASA                            Grade Assessment: III - A patient with severe  systemic disease. After reviewing the risks and                            benefits, the patient was deemed in satisfactory                            condition to undergo the procedure.                           After obtaining informed consent, the colonoscope                            was passed under direct vision. Throughout the                            procedure, the patient's blood  pressure, pulse, and                            oxygen saturations were monitored continuously. The                            [id number]) scope was introduced through the                            anus and advanced to the the cecum, identified by                            appendiceal orifice and ileocecal valve. The                            colonoscopy was performed without difficulty. The                            patient tolerated the procedure well. The quality                            of the bowel preparation was adequate. The                            ileocecal valve, appendiceal orifice, and rectum                            were photographed. The colonoscopy was performed                            without difficulty. Scope In: 8:00:50 AM Scope Out: 8:15:26 AM Scope Withdrawal Time: 0 hours 9 minutes 42 seconds  Total Procedure Duration: 0 hours 14 minutes 36 seconds  Findings:      The perianal and digital rectal examinations were normal.      Non-bleeding internal hemorrhoids were found during retroflexion. The       hemorrhoids were moderate, medium-sized and Grade I (internal       hemorrhoids that do not prolapse).      Scattered medium-mouthed diverticula were found in the sigmoid colon and  descending colon.      Two semi-pedunculated polyps were found in the mid descending colon and       hepatic flexure. The polyps were 6 to 7 mm in size. These polyps were       removed with a cold snare. Resection and retrieval were complete.       Estimated blood loss was minimal.      The exam was otherwise without abnormality on direct and retroflexion       views. Impression:               - Non-bleeding internal hemorrhoids.                           - Diverticulosis in the sigmoid colon and in the                            descending colon.                           - Two 6 to 7 mm polyps in the mid descending colon                            and at the  hepatic flexure, removed with a cold                            snare. Resected and retrieved.                           - The examination was otherwise normal on direct                            and retroflexion views. Moderate Sedation:      Moderate (conscious) sedation was personally administered by an       anesthesia professional. The following parameters were monitored: oxygen       saturation, heart rate, blood pressure, respiratory rate, EKG, adequacy       of pulmonary ventilation, and response to care. Recommendation:           - Patient has a contact number available for                            emergencies. The signs and symptoms of potential                            delayed complications were discussed with the                            patient. Return to normal activities tomorrow.                            Written discharge instructions were provided to the                            patient.                           -  Advance diet as tolerated.                           - Continue present medications.                           - Repeat colonoscopy date to be determined after                            pending pathology results are reviewed for                            surveillance.                           - Return to GI office in 1 year. See EGD report. Procedure Code(s):        --- Professional ---                           [phone], Colonoscopy, flexible; with removal of                            tumor(s), polyp(s), or other lesion(s) by snare                            technique Diagnosis Code(s):        --- Professional ---                           Z86.010, Personal history of colonic polyps                           K64.0, First degree hemorrhoids                           D12.4, Benign neoplasm of descending colon                           D12.3, Benign neoplasm of transverse colon (hepatic                            flexure or splenic flexure)                            K57.30, Diverticulosis of large intestine without                            perforation or abscess without bleeding CPT copyright 2022 American [employer] Association. All rights reserved. The codes documented in this report are preliminary and upon coder review may  be revised to meet current compliance requirements. Gerrit Friends. Ladasha Schnackenberg, MD Gennette Pac, MD 11/08/2022 8:21:00 AM This report has been signed electronically. Number of Addenda: 0

## 2022-11-08 NOTE — H&P (Signed)
@LOGO @   Primary Care Physician:  Anabel Halon, MD Primary Gastroenterologist:  Dr. Jena Gauss  Pre-Procedure History & Physical: HPI:  Sandy Valencia is a 62 y.o. female here for further evaluation management of esophageal dysphagia in the setting of longstanding GERD.  Also positive family history colon cancer personal history of multiple colonic adenomas removed over time-here for surveillance colonoscopy.  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   Anaphylactic shock, unspecified, subsequent encounter    Anemia, iron deficiency 02/14/2015   Angio-edema    Arthritis    Asthma    Asymptomatic varicose veins of right lower extremity    Chronic obstructive pulmonary disease with (acute) exacerbation (HCC)    Chronic rhinitis 05/21/2018   COVID-19    Diabetes mellitus    Essential (primary) hypertension    Family history of colon cancer 09/03/2012   Fatty liver    Gastro-esophageal reflux disease without esophagitis    GERD (gastroesophageal reflux disease)    History of colonic polyps 08/26/2017   Hypercholesteremia    Hypertension    Hypothyroidism    Low back pain    Mass of finger, right s/p surgical excision (mucoid cyst) 04/17/18 04/22/2018   Mucous cyst of digit of right hand    Obesity, unspecified    Other adverse food reactions, not elsewhere classified, initial encounter    Personal history of noncompliance with medical treatment, presenting hazards to health 05/18/2015   Rash and other nonspecific skin eruption    Sleep apnea    pt siad, "i had small amount but could not tolerate CPAP and they said it was ok since it was mild.   Sleep apnea, unspecified    Unspecified asthma, uncomplicated    Viral URI 07/13/2022   Zoster without complications     Past Surgical History:  Procedure Laterality Date   ABDOMINAL HYSTERECTOMY     CATARACT EXTRACTION W/PHACO Left 03/09/2022    Procedure: CATARACT EXTRACTION PHACO AND INTRAOCULAR LENS PLACEMENT (IOC);  Surgeon: Fabio Pierce, MD;  Location: [facility];  Service: Ophthalmology;  Laterality: Left;  CDE 7.89   CATARACT EXTRACTION W/PHACO Right 03/23/2022   Procedure: CATARACT EXTRACTION PHACO AND INTRAOCULAR LENS PLACEMENT (IOC);  Surgeon: Fabio Pierce, MD;  Location: [facility];  Service: Ophthalmology;  Laterality: Right;  CDE: 2.63   CHOLECYSTECTOMY     COLONOSCOPY N/A 09/22/2012   RMR: colonic polyps -removed as described above. tubular adenoma, next TCS 09/2017   COLONOSCOPY WITH PROPOFOL N/A 09/23/2017   Surgeon: Corbin Ade, MD; internal hemorrhoids, three 5-6 mm polyps removed, diverticulosis in sigmoid and descending colon.  Pathology with tubular adenomas and rectal hyperplastic polyp.  Recommended 5 year surveillance.   CYST EXCISION Left 07/19/2016   Procedure: CYST REMOVAL LEFT RING FINGER;  Surgeon: Vickki Hearing, MD;  Location: [facility];  Service: Orthopedics;  Laterality: Left;   CYST REMOVAL LEG Right    foot   ESOPHAGOGASTRODUODENOSCOPY N/A 10/21/2014   RMR: Small hiatal hernia; otherwise normal EGD status post passage of a Maloney dilator.    EXCISION MASS UPPER EXTREMETIES Right 04/17/2018   Procedure: EXCISION MASS UPPER EXTREMETIES right ring finger;  Surgeon: Vickki Hearing, MD;  Location: [facility];  Service: Orthopedics;  Laterality: Right;   MALONEY DILATION N/A 10/21/2014   Procedure: Elease Hashimoto DILATION;  Surgeon: Corbin Ade, MD;  Location: [facility];  Service: Endoscopy;  Laterality:  N/A;   MIDDLE EAR SURGERY Right    patched hole in ear drum   POLYPECTOMY  09/23/2017   Procedure: POLYPECTOMY;  Surgeon: Corbin Ade, MD;  Location: [facility];  Service: Endoscopy;;  polyp hepatic flexure polyp cs, splenic flexure polyp cs, rectal polyp cs   TOTAL ABDOMINAL HYSTERECTOMY      Prior to Admission medications   Medication Sig Start Date End Date Taking? Authorizing Provider   Accu-Chek FastClix Lancets MISC USE TO TEST 4 TIMES DAILY. 05/10/22  Yes Anabel Halon, MD  Accu-Chek FastClix Lancets MISC USE TO TEST 4 TIMES DAILY. 05/10/22  Yes Anabel Halon, MD  Accu-Chek FastClix Lancets MISC USE TO TEST 4 TIMES DAILY. 08/14/22  Yes Anabel Halon, MD  Accu-Chek FastClix Lancets MISC USE TO TEST 4 TIMES DAILY. 08/14/22  Yes Anabel Halon, MD  ACCU-CHEK GUIDE test strip USE TO TEST 4 TIMES DAILY. 10/11/22  Yes Anabel Halon, MD  aspirin EC 81 MG tablet Take 81 mg by mouth daily.   Yes [provider]  cetirizine (ZYRTEC) 10 MG tablet Take 1 tablet (10 mg total) by mouth daily as needed for allergies. Patient taking differently: Take 10 mg by mouth daily. 05/22/22  Yes Alfonse Spruce, MD  Cholecalciferol (VITAMIN D-3) 1000 UNITS CAPS Take 1,000 Units by mouth daily.    Yes [provider]  Continuous Blood Gluc Sensor (DEXCOM G6 SENSOR) MISC CHANGE SENSOR EVERY 10 DAYS AS DIRECTED. 08/21/22  Yes Dani Gobble, NP  Continuous Glucose Transmitter (DEXCOM G6 TRANSMITTER) MISC CHANGE TRANSMITTER EVERY 90 DAYS AS DIRECTED. 09/18/22  Yes Reardon, Alphonzo Lemmings J, NP  diclofenac Sodium (VOLTAREN) 1 % GEL APPLY 2 GRAMS TO AFFECTED AREAS 2 TIMES A DAY AS NEEDED 08/07/22  Yes Anabel Halon, MD  EPINEPHrine 0.3 mg/0.3 mL IJ SOAJ injection Inject 0.3 mg into the muscle as needed for anaphylaxis. 04/25/22  Yes Alfonse Spruce, MD  fluticasone [facility]) 50 MCG/ACT nasal spray Place 1 spray into both nostrils daily. 10/12/20  Yes Alfonse Spruce, MD  HYDROcodone-acetaminophen [facility]) 7.5-325 MG tablet Take 1 tablet by mouth 3 (three) times daily as needed for moderate pain. 06/28/20  Yes [provider]  insulin aspart (NOVOLOG FLEXPEN) 100 UNIT/ML FlexPen Inject 20-26 Units into the skin 3 (three) times daily with meals. 02/20/22  Yes Reardon, Freddi Starr, NP  ketoconazole (NIZORAL) 2 % cream APPLY TO AFFECTED AREAS TWICE DAILY Patient taking  differently: Apply 1 Application topically daily as needed for irritation. 09/21/22  Yes Patel, Earlie Lou, MD  LANTUS SOLOSTAR 100 UNIT/ML Solostar Pen Inject 60 Units into the skin at bedtime. Patient taking differently: Inject 70 Units into the skin at bedtime. 02/20/22  Yes Dani Gobble, NP  levothyroxine (SYNTHROID) 75 MCG tablet TAKE ONE (1) TABLET BY MOUTH EVERY DAY BEFORE BREAKFAST 08/21/22  Yes Ronny Bacon J, NP  lisinopril (ZESTRIL) 40 MG tablet Take 1 tablet (40 mg total) by mouth daily. 07/20/22  Yes Anabel Halon, MD  LITETOUCH PEN NEEDLES 31G X 8 MM MISC USE AS DIRECTED FOUR TIMES DAILY. 10/11/22  Yes Anabel Halon, MD  metFORMIN (GLUCOPHAGE-XR) 500 MG 24 hr tablet TAKE ONE TABLET BY MOUTH TWICE A DAY AFTER A MEAL 10/22/22  Yes Reardon, Alphonzo Lemmings J, NP  ondansetron (ZOFRAN) 4 MG tablet Take 1 tablet (4 mg total) by mouth every 6 (six) hours. Patient taking differently: Take 4 mg by mouth every 6 (six) hours as needed  for nausea or vomiting. 09/17/22  Yes Schuman, James T, PA-C  OZEMPIC, 1 MG/DOSE, 4 MG/3ML SOPN INJECT 1MG  ONCE A WEEK AS DIRECTED 09/25/22  Yes Reardon, Freddi Starr, NP  pantoprazole (PROTONIX) 40 MG tablet Take 1 tablet (40 mg total) by mouth daily. 04/25/22  Yes Alfonse Spruce, MD  pregabalin (LYRICA) 100 MG capsule Take 100 mg by mouth 2 (two) times daily. 04/20/22  Yes [provider]  Sod Picosulfate-Mag Ox-Cit Acd (CLENPIQ) 10-3.5-12 MG-GM -GM/175ML SOLN Take 1 kit by mouth as directed. 10/01/22  Yes Shyquan Stallbaumer, Gerrit Friends, MD  vitamin B-12 (CYANOCOBALAMIN) 500 MCG tablet Take 500 mcg by mouth daily.   Yes [provider]  albuterol (VENTOLIN HFA) 108 (90 Base) MCG/ACT inhaler INHALE 4 PUFFS INTO THE LUNGS EVERY 6 HOURS AS NEEDED FOR WHEEZING OR SHORTNESS OF BREATH 08/21/22   Alfonse Spruce, MD    Allergies as of 10/01/2022 - Review Complete 09/28/2022  Allergen Reaction Noted   Kiwi extract Anaphylaxis, Swelling, and Palpitations 04/07/2018    Other Anaphylaxis 04/22/2018   Statins  07/20/2022    Family History  Problem Relation Age of Onset   Colon cancer Mother        diagnosed age 49, underwent surgical resection, metastatic disease several years later   Diabetes Mother    Heart attack Father    Diabetes Sister    Hypothyroidism Brother    Diabetes Sister    Diabetes Sister    Diabetes Sister    Hypothyroidism Sister    Allergic rhinitis Neg Hx    Angioedema Neg Hx    Asthma Neg Hx    Atopy Neg Hx    Eczema Neg Hx    Immunodeficiency Neg Hx    Urticaria Neg Hx     Social History   Socioeconomic History   Marital status: Divorced    Spouse name: Not on file   Number of children: 2   Years of education: Not on file   Highest education level: Not on file  Occupational History   Not on file  Tobacco Use   Smoking status: Never   Smokeless tobacco: Never  Vaping Use   Vaping Use: Never used  Substance and Sexual Activity   Alcohol use: No    Alcohol/week: 0.0 standard drinks of alcohol   Drug use: No   Sexual activity: Yes    Birth control/protection: Surgical  Other Topics Concern   Not on file  Social History Narrative   Lives with grandson (since born)      Enjoys: vacation, travel, concerts      Diet: eats all food groups    Caffeine: diet Pepsi daily    Water:  8 cups daily       Wears seat belt    Does use phone   Smoke Retail buyer in safe area    Social Determinants of Health   Financial Resource Strain: Low Risk  (12/02/2019)   Overall Financial Resource Strain (CARDIA)    Difficulty of Paying Living Expenses: Not hard at all  Food Insecurity: No Food Insecurity (12/02/2019)   Hunger Vital Sign    Worried About Running Out of Food in the Last Year: Never true    Ran Out of Food in the Last Year: Never true  Transportation Needs: No Transportation Needs (12/02/2019)   PRAPARE - Administrator, Civil Service (Medical): No    Lack of Transportation  (Non-Medical): No  Physical Activity:  Insufficiently Active (12/02/2019)   Exercise Vital Sign    Days of Exercise per Week: 2 days    Minutes of Exercise per Session: 20 min  Stress: Not on file  Social Connections: Moderately Isolated (12/02/2019)   Social Connection and Isolation Panel [NHANES]    Frequency of Communication with Friends and Family: More than three times a week    Frequency of Social Gatherings with Friends and Family: More than three times a week    Attends Religious Services: More than 4 times per year    Active Member of [employer] or Organizations: No    Attends Banker Meetings: Never    Marital Status: Divorced  Catering manager Violence: Not At Risk (12/02/2019)   Humiliation, Afraid, Rape, and Kick questionnaire    Fear of Current or Ex-Partner: No    Emotionally Abused: No    Physically Abused: No    Sexually Abused: No    Review of Systems: See HPI, otherwise negative ROS  Physical Exam: BP 112/74 (BP Location: Right Arm)   Pulse 61   Temp 97.6 F (36.4 C)   Resp 16   SpO2 97%  General:   Alert,  Well-developed, well-nourished, pleasant and cooperative in NAD Skin:  Intact without significant lesions or rashes. Neck:  Supple; no masses or thyromegaly. No significant cervical adenopathy. Lungs:  Clear throughout to auscultation.   No wheezes, crackles, or rhonchi. No acute distress. Heart:  Regular rate and rhythm; no murmurs, clicks, rubs,  or gallops. Abdomen: Non-distended, normal bowel sounds.  Soft and nontender without appreciable mass or hepatosplenomegaly.  Pulses:  Normal pulses noted. Extremities:  Without clubbing or edema.  Impression/Plan: 62 year old lady with recurrent esophageal dysphagia responded nicely to empiric dilation (no lesion found) 2016.  Reflux fairly well-controlled at this time on PPI.  Due for surveillance colonoscopy as well given history of polyps.  Recommendations: I have offered the patient an EGD with  esophageal dilation as feasible/appropriate per plan as well as a surveillance colonoscopy today.     Notice: This dictation was prepared with Dragon dictation along with smaller phrase technology. Any transcriptional errors that result from this process are unintentional and may not be corrected upon review.

## 2022-11-09 ENCOUNTER — Encounter: Payer: Self-pay | Admitting: Internal Medicine

## 2022-11-09 LAB — SURGICAL PATHOLOGY

## 2022-11-09 NOTE — Anesthesia Postprocedure Evaluation (Signed)
Anesthesia Post Note  Patient: Nike W Bittman  Procedure(s) Performed: COLONOSCOPY WITH PROPOFOL ESOPHAGOGASTRODUODENOSCOPY (EGD) WITH PROPOFOL MALONEY DILATION BIOPSY POLYPECTOMY  Patient location during evaluation: Phase II Anesthesia Type: General Level of consciousness: awake Pain management: pain level controlled Vital Signs Assessment: post-procedure vital signs reviewed and stable Respiratory status: spontaneous breathing and respiratory function stable Cardiovascular status: blood pressure returned to baseline and stable Postop Assessment: no headache and no apparent nausea or vomiting Anesthetic complications: no Comments: Late entry   No notable events documented.   Last Vitals:  Vitals:   11/08/22 0705 11/08/22 0819  BP: 112/74 (!) 103/49  Pulse: 61 72  Resp: 16 15  Temp: 36.4 C 36.4 C  SpO2: 97% 100%    Last Pain:  Vitals:   11/08/22 0819  TempSrc: Oral  PainSc: 0-No pain                 Windell Norfolk

## 2022-11-12 ENCOUNTER — Other Ambulatory Visit: Payer: Self-pay | Admitting: Internal Medicine

## 2022-11-12 ENCOUNTER — Other Ambulatory Visit: Payer: Self-pay | Admitting: Nurse Practitioner

## 2022-11-14 DIAGNOSIS — M5416 Radiculopathy, lumbar region: Secondary | ICD-10-CM | POA: Diagnosis not present

## 2022-11-15 ENCOUNTER — Encounter (HOSPITAL_COMMUNITY): Payer: Self-pay | Admitting: Internal Medicine

## 2022-11-16 ENCOUNTER — Other Ambulatory Visit: Payer: Self-pay | Admitting: Neurosurgery

## 2022-11-22 ENCOUNTER — Encounter (HOSPITAL_COMMUNITY): Payer: Self-pay | Admitting: Neurosurgery

## 2022-11-22 ENCOUNTER — Other Ambulatory Visit: Payer: Self-pay

## 2022-11-22 NOTE — Progress Notes (Signed)
PCP - Dr Trena Platt Cardiologist - none Endocrinology - Ronny Bacon, NP  Chest x-ray - n/a EKG - 11/06/22 Stress Test - n/a ECHO - n/a Cardiac Cath - n/a  ICD Pacemaker/Loop - n/a  Sleep Study -  Yes (09/2012) CPAP - does not use CPAP  Diabetes Type 2 Do not take Metformin or Ozempic on the morning of surgery. Per protocol, hold Ozempic 7 days prior to surgery.  Last Qzempic dose was on 10/31/22.    THE NIGHT BEFORE SURGERY, take 35 units of Lantus Insulin.  If your blood sugar is less than 70 mg/dL, you will need to treat for low blood sugar: Treat a low blood sugar (less than 70 mg/dL) with  cup of clear juice (cranberry or apple), 4 glucose tablets, OR glucose gel. Recheck blood sugar in 15 minutes after treatment (to make sure it is greater than 70 mg/dL). If your blood sugar is not greater than 70 mg/dL on recheck, call [phone] for further instructions.  Aspirin Instructions: Follow your surgeon's instructions on when to stop aspirin prior to surgery,  If no instructions were given by your surgeon then you will need to call the office for those instructions.  NPO  Anesthesia review: Yes  STOP now taking any Aspirin (unless otherwise instructed by your surgeon), Aleve, Naproxen, Ibuprofen, Motrin, Advil, Goody's, BC's, all herbal medications, fish oil, and all vitamins.   Coronavirus Screening Do you have any of the following symptoms:  Cough yes/no: No Fever (>100.21F)  yes/no: No Runny nose yes/no: No Sore throat yes/no: No Difficulty breathing/shortness of breath  yes/no: No  Have you traveled in the last 14 days and where? yes/no: No  Patient verbalized understanding of instructions that were given via phone.

## 2022-11-26 ENCOUNTER — Other Ambulatory Visit: Payer: Self-pay

## 2022-11-26 ENCOUNTER — Observation Stay (HOSPITAL_COMMUNITY)
Admission: RE | Admit: 2022-11-26 | Discharge: 2022-11-27 | Disposition: A | Discharge status: Home / Self Care | Payer: Medicaid Other | Attending: Neurosurgery | Admitting: Neurosurgery

## 2022-11-26 ENCOUNTER — Ambulatory Visit (HOSPITAL_BASED_OUTPATIENT_CLINIC_OR_DEPARTMENT_OTHER): Payer: Medicaid Other | Admitting: Physician Assistant

## 2022-11-26 ENCOUNTER — Ambulatory Visit (HOSPITAL_COMMUNITY): Payer: Medicaid Other

## 2022-11-26 ENCOUNTER — Encounter (HOSPITAL_COMMUNITY): Payer: Self-pay | Admitting: Neurosurgery

## 2022-11-26 ENCOUNTER — Ambulatory Visit (HOSPITAL_COMMUNITY)
Admission: RE | Disposition: A | Discharge status: Home / Self Care | Payer: Self-pay | Source: Home / Self Care | Attending: Neurosurgery

## 2022-11-26 ENCOUNTER — Ambulatory Visit (HOSPITAL_COMMUNITY): Payer: Medicaid Other | Admitting: Physician Assistant

## 2022-11-26 DIAGNOSIS — E119 Type 2 diabetes mellitus without complications: Secondary | ICD-10-CM | POA: Insufficient documentation

## 2022-11-26 DIAGNOSIS — J45909 Unspecified asthma, uncomplicated: Secondary | ICD-10-CM | POA: Diagnosis not present

## 2022-11-26 DIAGNOSIS — E039 Hypothyroidism, unspecified: Secondary | ICD-10-CM | POA: Insufficient documentation

## 2022-11-26 DIAGNOSIS — Z8616 Personal history of COVID-19: Secondary | ICD-10-CM | POA: Insufficient documentation

## 2022-11-26 DIAGNOSIS — M48061 Spinal stenosis, lumbar region without neurogenic claudication: Secondary | ICD-10-CM | POA: Diagnosis not present

## 2022-11-26 DIAGNOSIS — Z79899 Other long term (current) drug therapy: Secondary | ICD-10-CM | POA: Insufficient documentation

## 2022-11-26 DIAGNOSIS — M5116 Intervertebral disc disorders with radiculopathy, lumbar region: Secondary | ICD-10-CM | POA: Diagnosis not present

## 2022-11-26 DIAGNOSIS — J441 Chronic obstructive pulmonary disease with (acute) exacerbation: Secondary | ICD-10-CM | POA: Insufficient documentation

## 2022-11-26 DIAGNOSIS — I10 Essential (primary) hypertension: Secondary | ICD-10-CM | POA: Insufficient documentation

## 2022-11-26 DIAGNOSIS — Z794 Long term (current) use of insulin: Secondary | ICD-10-CM | POA: Diagnosis not present

## 2022-11-26 DIAGNOSIS — Z0389 Encounter for observation for other suspected diseases and conditions ruled out: Secondary | ICD-10-CM | POA: Diagnosis not present

## 2022-11-26 DIAGNOSIS — Z7982 Long term (current) use of aspirin: Secondary | ICD-10-CM | POA: Diagnosis not present

## 2022-11-26 DIAGNOSIS — M5416 Radiculopathy, lumbar region: Secondary | ICD-10-CM | POA: Diagnosis present

## 2022-11-26 DIAGNOSIS — Z7984 Long term (current) use of oral hypoglycemic drugs: Secondary | ICD-10-CM | POA: Diagnosis not present

## 2022-11-26 DIAGNOSIS — J449 Chronic obstructive pulmonary disease, unspecified: Secondary | ICD-10-CM | POA: Diagnosis not present

## 2022-11-26 HISTORY — DX: Restless legs syndrome: G25.81

## 2022-11-26 HISTORY — PX: LUMBAR LAMINECTOMY/DECOMPRESSION MICRODISCECTOMY: SHX5026

## 2022-11-26 HISTORY — DX: Unspecified hearing loss, unspecified ear: H91.90

## 2022-11-26 HISTORY — PX: BACK SURGERY: SHX140

## 2022-11-26 LAB — GLUCOSE, CAPILLARY
Glucose-Capillary: 127 mg/dL — ABNORMAL HIGH (ref 70–99)
Glucose-Capillary: 101 mg/dL — ABNORMAL HIGH (ref 70–99)
Glucose-Capillary: 173 mg/dL — ABNORMAL HIGH (ref 70–99)
Glucose-Capillary: 209 mg/dL — ABNORMAL HIGH (ref 70–99)
Glucose-Capillary: 302 mg/dL — ABNORMAL HIGH (ref 70–99)

## 2022-11-26 LAB — SURGICAL PCR SCREEN
MRSA, PCR: NEGATIVE
Staphylococcus aureus: NEGATIVE

## 2022-11-26 SURGERY — LUMBAR LAMINECTOMY/DECOMPRESSION MICRODISCECTOMY 1 LEVEL
Anesthesia: General | Site: Back | Laterality: Bilateral

## 2022-11-26 MED ORDER — ONDANSETRON HCL 4 MG PO TABS: 4.0000 mg | ORAL_TABLET | Freq: Four times a day (QID) | ORAL | Status: DC | PRN

## 2022-11-26 MED ORDER — HYDROMORPHONE HCL 1 MG/ML IJ SOLN: 0.2500 mg | INTRAMUSCULAR | Status: DC | PRN

## 2022-11-26 MED ORDER — HYDROCODONE-ACETAMINOPHEN 5-325 MG PO TABS
1.0000 | ORAL_TABLET | ORAL | Status: DC | PRN
  Administered 2022-11-26 – 2022-11-27 (×2): 1 via ORAL
  Filled 2022-11-26 (×3): qty 1

## 2022-11-26 MED ORDER — PROPOFOL 10 MG/ML IV BOLUS
INTRAVENOUS | Status: DC | PRN
  Administered 2022-11-26: 100 mg via INTRAVENOUS

## 2022-11-26 MED ORDER — CHLORHEXIDINE GLUCONATE 0.12 % MT SOLN
15.0000 mL | Freq: Once | OROMUCOSAL | Status: AC
  Filled 2022-11-26: qty 15

## 2022-11-26 MED ORDER — INSULIN ASPART 100 UNIT/ML IJ SOLN
20.0000 [IU] | Freq: Three times a day (TID) | INTRAMUSCULAR | Status: DC
  Administered 2022-11-26: 20 [IU] via SUBCUTANEOUS
  Administered 2022-11-27: 22 [IU] via SUBCUTANEOUS

## 2022-11-26 MED ORDER — INSULIN ASPART 100 UNIT/ML IJ SOLN: 0.0000 [IU] | Freq: Three times a day (TID) | INTRAMUSCULAR | Status: DC

## 2022-11-26 MED ORDER — SODIUM CHLORIDE 0.9 % IV SOLN
250.0000 mL | INTRAVENOUS | Status: DC
  Administered 2022-11-26: 250 mL via INTRAVENOUS

## 2022-11-26 MED ORDER — BUPIVACAINE HCL (PF) 0.25 % IJ SOLN
INTRAMUSCULAR | Status: AC
  Filled 2022-11-26: qty 30

## 2022-11-26 MED ORDER — FENTANYL CITRATE (PF) 250 MCG/5ML IJ SOLN
INTRAMUSCULAR | Status: DC | PRN
  Administered 2022-11-26: 100 ug via INTRAVENOUS

## 2022-11-26 MED ORDER — KETOROLAC TROMETHAMINE 15 MG/ML IJ SOLN
INTRAMUSCULAR | Status: DC | PRN
  Administered 2022-11-26: 30 mg via INTRAVENOUS

## 2022-11-26 MED ORDER — LIDOCAINE 2% (20 MG/ML) 5 ML SYRINGE
INTRAMUSCULAR | Status: AC
  Filled 2022-11-26: qty 5

## 2022-11-26 MED ORDER — LIDOCAINE 2% (20 MG/ML) 5 ML SYRINGE
INTRAMUSCULAR | Status: DC | PRN
  Administered 2022-11-26: 60 mg via INTRAVENOUS

## 2022-11-26 MED ORDER — METFORMIN HCL ER 500 MG PO TB24
500.0000 mg | ORAL_TABLET | Freq: Two times a day (BID) | ORAL | Status: DC
  Administered 2022-11-26 – 2022-11-27 (×2): 500 mg via ORAL
  Filled 2022-11-26 (×2): qty 1

## 2022-11-26 MED ORDER — ACETAMINOPHEN 10 MG/ML IV SOLN
INTRAVENOUS | Status: DC | PRN
  Administered 2022-11-26: 1000 mg via INTRAVENOUS

## 2022-11-26 MED ORDER — LORATADINE 10 MG PO TABS
10.0000 mg | ORAL_TABLET | Freq: Every day | ORAL | Status: DC
  Administered 2022-11-26: 10 mg via ORAL
  Filled 2022-11-26: qty 1

## 2022-11-26 MED ORDER — VITAMIN D 25 MCG (1000 UNIT) PO TABS
1000.0000 [IU] | ORAL_TABLET | Freq: Every day | ORAL | Status: DC
  Administered 2022-11-26: 1000 [IU] via ORAL
  Filled 2022-11-26: qty 1

## 2022-11-26 MED ORDER — PANTOPRAZOLE SODIUM 40 MG PO TBEC: 40.0000 mg | DELAYED_RELEASE_TABLET | Freq: Every day | ORAL | Status: DC

## 2022-11-26 MED ORDER — PROPOFOL 10 MG/ML IV BOLUS
INTRAVENOUS | Status: AC
  Filled 2022-11-26: qty 20

## 2022-11-26 MED ORDER — KETOROLAC TROMETHAMINE 30 MG/ML IJ SOLN
INTRAMUSCULAR | Status: AC
  Filled 2022-11-26: qty 1

## 2022-11-26 MED ORDER — SODIUM CHLORIDE 0.9% FLUSH
3.0000 mL | Freq: Two times a day (BID) | INTRAVENOUS | Status: DC
  Administered 2022-11-26: 3 mL via INTRAVENOUS

## 2022-11-26 MED ORDER — BUPIVACAINE HCL (PF) 0.25 % IJ SOLN
INTRAMUSCULAR | Status: DC | PRN
  Administered 2022-11-26: 20 mL

## 2022-11-26 MED ORDER — DEXAMETHASONE SODIUM PHOSPHATE 10 MG/ML IJ SOLN
INTRAMUSCULAR | Status: DC | PRN
  Administered 2022-11-26: 5 mg via INTRAVENOUS

## 2022-11-26 MED ORDER — MIDAZOLAM HCL 2 MG/2ML IJ SOLN
INTRAMUSCULAR | Status: AC
  Filled 2022-11-26: qty 2

## 2022-11-26 MED ORDER — PHENOL 1.4 % MT LIQD: 1.0000 | OROMUCOSAL | Status: DC | PRN

## 2022-11-26 MED ORDER — MENTHOL 3 MG MT LOZG: 1.0000 | LOZENGE | OROMUCOSAL | Status: DC | PRN

## 2022-11-26 MED ORDER — ONDANSETRON HCL 4 MG/2ML IJ SOLN: 4.0000 mg | Freq: Once | INTRAMUSCULAR | Status: DC | PRN

## 2022-11-26 MED ORDER — ACETAMINOPHEN 10 MG/ML IV SOLN
INTRAVENOUS | Status: AC
  Filled 2022-11-26: qty 100

## 2022-11-26 MED ORDER — THROMBIN 5000 UNITS EX SOLR
CUTANEOUS | Status: AC
  Filled 2022-11-26: qty 10000

## 2022-11-26 MED ORDER — HYDROMORPHONE HCL 1 MG/ML IJ SOLN: 1.0000 mg | INTRAMUSCULAR | Status: DC | PRN

## 2022-11-26 MED ORDER — CEFAZOLIN SODIUM-DEXTROSE 1-4 GM/50ML-% IV SOLN
1.0000 g | Freq: Three times a day (TID) | INTRAVENOUS | Status: AC
  Administered 2022-11-26 – 2022-11-27 (×2): 1 g via INTRAVENOUS
  Filled 2022-11-26 (×2): qty 50

## 2022-11-26 MED ORDER — CHLORHEXIDINE GLUCONATE CLOTH 2 % EX PADS: 6.0000 | MEDICATED_PAD | Freq: Once | CUTANEOUS | Status: DC

## 2022-11-26 MED ORDER — 0.9 % SODIUM CHLORIDE (POUR BTL) OPTIME
TOPICAL | Status: DC | PRN
  Administered 2022-11-26: 1000 mL

## 2022-11-26 MED ORDER — CYANOCOBALAMIN 500 MCG PO TABS
500.0000 ug | ORAL_TABLET | Freq: Every day | ORAL | Status: DC
  Administered 2022-11-26: 500 ug via ORAL
  Filled 2022-11-26 (×2): qty 1

## 2022-11-26 MED ORDER — ALBUTEROL SULFATE (2.5 MG/3ML) 0.083% IN NEBU: 2.5000 mg | INHALATION_SOLUTION | Freq: Four times a day (QID) | RESPIRATORY_TRACT | Status: DC | PRN

## 2022-11-26 MED ORDER — HYDROCODONE-ACETAMINOPHEN 10-325 MG PO TABS: 1.0000 | ORAL_TABLET | ORAL | Status: DC | PRN

## 2022-11-26 MED ORDER — ROCURONIUM BROMIDE 10 MG/ML (PF) SYRINGE
PREFILLED_SYRINGE | INTRAVENOUS | Status: DC | PRN
  Administered 2022-11-26: 100 mg via INTRAVENOUS

## 2022-11-26 MED ORDER — SODIUM CHLORIDE 0.9% FLUSH: 3.0000 mL | INTRAVENOUS | Status: DC | PRN

## 2022-11-26 MED ORDER — INSULIN GLARGINE-YFGN 100 UNIT/ML ~~LOC~~ SOLN
60.0000 [IU] | Freq: Every day | SUBCUTANEOUS | Status: DC
  Administered 2022-11-26: 60 [IU] via SUBCUTANEOUS
  Filled 2022-11-26 (×2): qty 0.6

## 2022-11-26 MED ORDER — THROMBIN (RECOMBINANT) 5000 UNITS EX SOLR
CUTANEOUS | Status: DC | PRN
  Administered 2022-11-26: 10 mL via TOPICAL

## 2022-11-26 MED ORDER — LEVOTHYROXINE SODIUM 75 MCG PO TABS
75.0000 ug | ORAL_TABLET | Freq: Every day | ORAL | Status: DC
  Administered 2022-11-27: 75 ug via ORAL
  Filled 2022-11-26: qty 1

## 2022-11-26 MED ORDER — ONDANSETRON HCL 4 MG/2ML IJ SOLN: 4.0000 mg | Freq: Four times a day (QID) | INTRAMUSCULAR | Status: DC | PRN

## 2022-11-26 MED ORDER — ONDANSETRON HCL 4 MG/2ML IJ SOLN
INTRAMUSCULAR | Status: DC | PRN
  Administered 2022-11-26: 4 mg via INTRAVENOUS

## 2022-11-26 MED ORDER — ONDANSETRON HCL 4 MG/2ML IJ SOLN
INTRAMUSCULAR | Status: AC
  Filled 2022-11-26: qty 2

## 2022-11-26 MED ORDER — PREGABALIN 100 MG PO CAPS
100.0000 mg | ORAL_CAPSULE | Freq: Two times a day (BID) | ORAL | Status: DC
  Administered 2022-11-26: 100 mg via ORAL
  Filled 2022-11-26: qty 1

## 2022-11-26 MED ORDER — CEFAZOLIN SODIUM-DEXTROSE 2-4 GM/100ML-% IV SOLN
2.0000 g | INTRAVENOUS | Status: AC
  Administered 2022-11-26: 2 g via INTRAVENOUS
  Filled 2022-11-26: qty 100

## 2022-11-26 MED ORDER — FENTANYL CITRATE (PF) 250 MCG/5ML IJ SOLN
INTRAMUSCULAR | Status: AC
  Filled 2022-11-26: qty 5

## 2022-11-26 MED ORDER — ROCURONIUM BROMIDE 10 MG/ML (PF) SYRINGE
PREFILLED_SYRINGE | INTRAVENOUS | Status: AC
  Filled 2022-11-26: qty 10

## 2022-11-26 MED ORDER — CHLORHEXIDINE GLUCONATE 0.12 % MT SOLN
OROMUCOSAL | Status: AC
  Administered 2022-11-26: 15 mL via OROMUCOSAL
  Filled 2022-11-26: qty 15

## 2022-11-26 MED ORDER — SUGAMMADEX SODIUM 200 MG/2ML IV SOLN
INTRAVENOUS | Status: DC | PRN
  Administered 2022-11-26: 200 mg via INTRAVENOUS

## 2022-11-26 MED ORDER — PHENYLEPHRINE 80 MCG/ML (10ML) SYRINGE FOR IV PUSH (FOR BLOOD PRESSURE SUPPORT)
PREFILLED_SYRINGE | INTRAVENOUS | Status: DC | PRN
  Administered 2022-11-26: 40 ug via INTRAVENOUS
  Administered 2022-11-26 (×3): 80 ug via INTRAVENOUS

## 2022-11-26 MED ORDER — MIDAZOLAM HCL 2 MG/2ML IJ SOLN
INTRAMUSCULAR | Status: DC | PRN
  Administered 2022-11-26: 2 mg via INTRAVENOUS

## 2022-11-26 MED ORDER — FLUTICASONE PROPIONATE 50 MCG/ACT NA SUSP
1.0000 | Freq: Every day | NASAL | Status: DC
  Filled 2022-11-26: qty 16

## 2022-11-26 MED ORDER — LISINOPRIL 20 MG PO TABS
40.0000 mg | ORAL_TABLET | Freq: Every day | ORAL | Status: DC
  Filled 2022-11-26: qty 2

## 2022-11-26 MED ORDER — ACETAMINOPHEN 650 MG RE SUPP: 650.0000 mg | RECTAL | Status: DC | PRN

## 2022-11-26 MED ORDER — CYCLOBENZAPRINE HCL 10 MG PO TABS: 10.0000 mg | ORAL_TABLET | Freq: Three times a day (TID) | ORAL | Status: DC | PRN

## 2022-11-26 MED ORDER — AMISULPRIDE (ANTIEMETIC) 5 MG/2ML IV SOLN: 10.0000 mg | Freq: Once | INTRAVENOUS | Status: DC | PRN

## 2022-11-26 MED ORDER — LACTATED RINGERS IV SOLN: INTRAVENOUS | Status: DC

## 2022-11-26 MED ORDER — ACETAMINOPHEN 325 MG PO TABS: 650.0000 mg | ORAL_TABLET | ORAL | Status: DC | PRN

## 2022-11-26 MED ORDER — OXYCODONE HCL 5 MG PO TABS: 5.0000 mg | ORAL_TABLET | Freq: Once | ORAL | Status: DC | PRN

## 2022-11-26 MED ORDER — KETOROLAC TROMETHAMINE 15 MG/ML IJ SOLN
30.0000 mg | Freq: Four times a day (QID) | INTRAMUSCULAR | Status: DC
  Administered 2022-11-26 – 2022-11-27 (×3): 30 mg via INTRAVENOUS
  Filled 2022-11-26 (×3): qty 2

## 2022-11-26 MED ORDER — DEXAMETHASONE SODIUM PHOSPHATE 10 MG/ML IJ SOLN
INTRAMUSCULAR | Status: AC
  Filled 2022-11-26: qty 1

## 2022-11-26 MED ORDER — OXYCODONE HCL 5 MG/5ML PO SOLN: 5.0000 mg | Freq: Once | ORAL | Status: DC | PRN

## 2022-11-26 SURGICAL SUPPLY — 44 items
ADH SKN CLS APL DERMABOND .7 (GAUZE/BANDAGES/DRESSINGS) ×1
ADH SKN CLS LQ APL DERMABOND (GAUZE/BANDAGES/DRESSINGS) ×1
ADH SKNCLS APL OCTYL .7 VIOL (GAUZE/BANDAGES/DRESSINGS) ×1
ADH SKNCLS LQ APL MCBL BRR (GAUZE/BANDAGES/DRESSINGS) ×1
APL SKNCLS STERI-STRIP NONHPOA (GAUZE/BANDAGES/DRESSINGS) ×1
BAG COUNTER SPONGE SURGICOUNT (BAG) ×1 IMPLANT
BAG DECANTER FOR FLEXI CONT (MISCELLANEOUS) ×1 IMPLANT
BAG SPNG CNTER NS LX DISP (BAG) ×1
BENZOIN TINCTURE PRP APPL 2/3 (GAUZE/BANDAGES/DRESSINGS) ×1 IMPLANT
BUR CUTTER 7.0 ROUND (BURR) ×1 IMPLANT
CANISTER SUCT 3000ML PPV (MISCELLANEOUS) ×1 IMPLANT
CLSR STERI-STRIP ANTIMIC 1/2X4 (GAUZE/BANDAGES/DRESSINGS) IMPLANT
DERMABOND ADVANCED .7 DNX12 (GAUZE/BANDAGES/DRESSINGS) ×1 IMPLANT
DERMABOND ADVANCED .7 DNX6 (GAUZE/BANDAGES/DRESSINGS) IMPLANT
DRAPE LAPAROTOMY 100X72X124 (DRAPES) ×1 IMPLANT
DRAPE MICROSCOPE SLANT 54X150 (MISCELLANEOUS) ×1 IMPLANT
DRAPE SURG 17X23 STRL (DRAPES) ×2 IMPLANT
DRSG OPSITE POSTOP 4X6 (GAUZE/BANDAGES/DRESSINGS) IMPLANT
DURAPREP 26ML APPLICATOR (WOUND CARE) ×1 IMPLANT
ELECT REM PT RETURN 9FT ADLT (ELECTROSURGICAL) ×1
ELECTRODE REM PT RTRN 9FT ADLT (ELECTROSURGICAL) ×1 IMPLANT
GAUZE SPONGE 4X4 12PLY STRL (GAUZE/BANDAGES/DRESSINGS) ×1 IMPLANT
GLOVE BIO SURGEON STRL SZ 6.5 (GLOVE) ×1 IMPLANT
GLOVE BIOGEL PI IND STRL 6.5 (GLOVE) ×1 IMPLANT
GLOVE ECLIPSE 9.0 STRL (GLOVE) ×1 IMPLANT
GOWN STRL REUS W/ TWL XL LVL3 (GOWN DISPOSABLE) ×1 IMPLANT
GOWN STRL REUS W/TWL XL LVL3 (GOWN DISPOSABLE) ×1
KIT BASIN OR (CUSTOM PROCEDURE TRAY) ×1 IMPLANT
KIT TURNOVER KIT B (KITS) ×1 IMPLANT
NDL HYPO 22X1.5 SAFETY MO (MISCELLANEOUS) ×1 IMPLANT
NEEDLE HYPO 22X1.5 SAFETY MO (MISCELLANEOUS) ×1 IMPLANT
NS IRRIG 1000ML POUR BTL (IV SOLUTION) ×1 IMPLANT
PACK LAMINECTOMY NEURO (CUSTOM PROCEDURE TRAY) ×1 IMPLANT
PAD ARMBOARD 7.5X6 YLW CONV (MISCELLANEOUS) ×3 IMPLANT
SOL ELECTROSURG ANTI STICK (MISCELLANEOUS) ×1
SOLUTION ELECTROSURG ANTI STCK (MISCELLANEOUS) ×1 IMPLANT
SPIKE FLUID TRANSFER (MISCELLANEOUS) ×1 IMPLANT
SPONGE SURGIFOAM ABS GEL SZ50 (HEMOSTASIS) ×1 IMPLANT
STRIP CLOSURE SKIN 1/2X4 (GAUZE/BANDAGES/DRESSINGS) ×1 IMPLANT
SUT VIC AB 2-0 CT1 18 (SUTURE) ×1 IMPLANT
SUT VIC AB 3-0 SH 8-18 (SUTURE) ×1 IMPLANT
TOWEL GREEN STERILE (TOWEL DISPOSABLE) ×1 IMPLANT
TOWEL GREEN STERILE FF (TOWEL DISPOSABLE) ×1 IMPLANT
WATER STERILE IRR 1000ML POUR (IV SOLUTION) ×1 IMPLANT

## 2022-11-26 NOTE — Op Note (Signed)
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  Asst. Surgeon: Doran Durand, NP  Anesthesia: General  Indication: 62 year old female with bilateral lower extremity symptoms left greater than right failing conservative management.  Workup demonstrates evidence of significant disc generation with a broad-based rightward disc herniation at L4-5 causing severe lateral recess stenosis bilaterally.  Patient has failed conservative management presents now for decompression in hopes of improving her symptoms.  Operative note: After induction of anesthesia, patient positioned prone on the Wilson frame and properly padded.  Lumbar region prepped and draped sterilely.  Incision made overlying L4-5.  Dissection performed first on the left side.  X-ray taken.  Level found to be the L5-S1 level.  Dissection redirected 1 level cephalad.  Dissection performed bilaterally.  Retractor placed.  Bilateral decompressive laminotomies and foraminotomies then performed using high-speed drill and Kerrison rongeurs to remove the inferior aspect of the lamina of L4 bilaterally.  The medial aspect the L4-5 facet joints bilaterally and the superior of the L5 lamina.  Ligamentum flavum elevated and resected.  Underlying thecal sac and L5 nerve roots identified bilaterally.  Foraminotomies completed on the course of the L5 nerve roots.  Microscope then brought in the field used microsuction the right-sided L4-5 spinal canal.  Epidural venous plexus coagulated and cut.  Thecal sac and right L5 nerve root gently mobilized retracted towards the midline.  Disc space and disc herniation identified.  Disc base incised.  Discectomy  then performed using various instruments to remove all elements of the disc herniation and all and all loose or obviously degenerative disc tear from the interspace.  After a thorough discectomy been achieved.  There was no evidence of injury to the thecal sac or nerve roots.  The wound was then irrigated.  Gelfoam was placed topically for hemostasis.  Wounds and closed in layers with Vicryl sutures.  Steri-Strips and sterile dressing were applied.  No apparent complications.  Patient tolerated the procedure well and she returns to the recovery room postop.

## 2022-11-26 NOTE — H&P (Signed)
Sandy Valencia is an 62 y.o. female.   Chief Complaint: Back pain HPI: 62 year old female with back and bilateral lower extremity symptoms consistent with L5 radiculopathies left greater than right.  Workup demonstrates evidence of significant disc generation with a broad-based rightward L4-5 disc herniation with significant stenosis.  Patient has failed conservative management presents now for bilateral lumbar laminotomies with right-sided L4-5 microdiscectomy.  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   Anaphylactic shock, unspecified, subsequent encounter    Anemia, iron deficiency 02/14/2015   patient is not aware this dx   Angio-edema    Arthritis    Asthma    Mild intermittent asthma   Asymptomatic varicose veins of right lower extremity    Chronic obstructive pulmonary disease with (acute) exacerbation (HCC)    patient denies this dx   Chronic rhinitis 05/21/2018   COVID-19 2019   Diabetes mellitus    type 2   Essential (primary) hypertension    Family history of colon cancer 09/03/2012   Fatty liver    Gastro-esophageal reflux disease without esophagitis    GERD (gastroesophageal reflux disease)    Hearing loss    wears hearing aid   History of colonic polyps 08/26/2017   Hypercholesteremia    Hypertension    Hypothyroidism    Low back pain    and left leg pain   Mass of finger, right s/p surgical excision (mucoid cyst) 04/17/18 04/22/2018   Mucous cyst of digit of right hand    Obesity, unspecified    Other adverse food reactions, not elsewhere classified, initial encounter    Personal history of noncompliance with medical treatment, presenting hazards to health 05/18/2015   Rash and other nonspecific skin eruption    Restless legs syndrome    patient denies this dx   Sleep apnea    pt siad, "i had small amount but could not tolerate CPAP and they said it was ok since it  was mild.   Unspecified asthma, uncomplicated    Viral URI 07/13/2022   Zoster without complications     Past Surgical History:  Procedure Laterality Date   ABDOMINAL HYSTERECTOMY     BIOPSY  11/08/2022   Procedure: BIOPSY;  Surgeon: Corbin Ade, MD;  [facility];  Service: Endoscopy;;   CATARACT EXTRACTION W/PHACO Left 03/09/2022   Procedure: CATARACT EXTRACTION PHACO AND INTRAOCULAR LENS PLACEMENT (IOC);  Surgeon: Fabio Pierce, MD;  Location: [facility];  Service: Ophthalmology;  Laterality: Left;  CDE 7.89   CATARACT EXTRACTION W/PHACO Right 03/23/2022   Procedure: CATARACT EXTRACTION PHACO AND INTRAOCULAR LENS PLACEMENT (IOC);  Surgeon: Fabio Pierce, MD;  Location: [facility];  Service: Ophthalmology;  Laterality: Right;  CDE: 2.63   CHOLECYSTECTOMY     COLONOSCOPY N/A 09/22/2012   RMR: colonic polyps -removed as described above. tubular adenoma, next TCS 09/2017   COLONOSCOPY WITH PROPOFOL N/A 09/23/2017   Surgeon: Corbin Ade, MD; internal hemorrhoids, three 5-6 mm polyps removed, diverticulosis in sigmoid and descending colon.  Pathology with tubular adenomas and rectal hyperplastic polyp.  Recommended 5 year surveillance.   COLONOSCOPY WITH PROPOFOL N/A 11/08/2022   Procedure: COLONOSCOPY WITH PROPOFOL;  Surgeon: Corbin Ade, MD;  [facility];  Service: Endoscopy;  Laterality: N/A;  815am, asa 3   CYST EXCISION Left 07/19/2016   Procedure: CYST REMOVAL LEFT RING FINGER;  Surgeon: Vickki Hearing,  MD;  Location: [facility];  Service: Orthopedics;  Laterality: Left;   CYST REMOVAL LEG Right    foot   ESOPHAGOGASTRODUODENOSCOPY N/A 10/21/2014   RMR: Small hiatal hernia; otherwise normal EGD status post passage of a Maloney dilator.    ESOPHAGOGASTRODUODENOSCOPY (EGD) WITH PROPOFOL N/A 11/08/2022   Procedure: ESOPHAGOGASTRODUODENOSCOPY (EGD) WITH PROPOFOL;  Surgeon: Corbin Ade, MD;  [facility];  Service: Endoscopy;  Laterality: N/A;    EXCISION MASS UPPER EXTREMETIES Right 04/17/2018   Procedure: EXCISION MASS UPPER EXTREMETIES right ring finger;  Surgeon: Vickki Hearing, MD;  Location: [facility];  Service: Orthopedics;  Laterality: Right;   MALONEY DILATION N/A 10/21/2014   Procedure: Elease Hashimoto DILATION;  Surgeon: Corbin Ade, MD;  [facility];  Service: Endoscopy;  Laterality: N/A;   MALONEY DILATION N/A 11/08/2022   Procedure: Elease Hashimoto DILATION;  Surgeon: Corbin Ade, MD;  [facility];  Service: Endoscopy;  Laterality: N/A;   MIDDLE EAR SURGERY Right    patched hole in ear drum   POLYPECTOMY  09/23/2017   Procedure: POLYPECTOMY;  Surgeon: Corbin Ade, MD;  [facility];  Service: Endoscopy;;  polyp hepatic flexure polyp cs, splenic flexure polyp cs, rectal polyp cs   POLYPECTOMY  11/08/2022   Procedure: POLYPECTOMY;  Surgeon: Corbin Ade, MD;  [facility];  Service: Endoscopy;;   TOTAL ABDOMINAL HYSTERECTOMY      Family History  Problem Relation Age of Onset   Colon cancer Mother        diagnosed age 60, underwent surgical resection, metastatic disease several years later   Diabetes Mother    Heart attack Father    Diabetes Sister    Hypothyroidism Brother    Diabetes Sister    Diabetes Sister    Diabetes Sister    Hypothyroidism Sister    Allergic rhinitis Neg Hx    Angioedema Neg Hx    Asthma Neg Hx    Atopy Neg Hx    Eczema Neg Hx    Immunodeficiency Neg Hx    Urticaria Neg Hx    Social History:  reports that she has never smoked. She has never used smokeless tobacco. She reports that she does not drink alcohol and does not use drugs.  Allergies:  Allergies  Allergen Reactions   Kiwi Extract Anaphylaxis, Swelling and Palpitations   Other Anaphylaxis    Walnuts (avoids all tree nuts)   Statins     Transaminase elevation    Medications Prior to Admission  Medication Sig Dispense Refill   cetirizine (ZYRTEC) 10 MG tablet Take 1 tablet (10  mg total) by mouth daily as needed for allergies. (Patient taking differently: Take 10 mg by mouth daily.) 30 tablet 5   Cholecalciferol (VITAMIN D-3) 1000 UNITS CAPS Take 1,000 Units by mouth daily.      HYDROcodone-acetaminophen (NORCO) 7.5-325 MG tablet Take 1 tablet by mouth 3 (three) times daily as needed for moderate pain.     insulin aspart (NOVOLOG FLEXPEN) 100 UNIT/ML FlexPen Inject 20-26 Units into the skin 3 (three) times daily with meals. 60 mL 3   LANTUS SOLOSTAR 100 UNIT/ML Solostar Pen Inject 60 Units into the skin at bedtime. (Patient taking differently: Inject 70 Units into the skin at bedtime.) 60 mL 3   levothyroxine (SYNTHROID) 75 MCG tablet TAKE ONE (1) TABLET BY MOUTH EVERY DAY BEFORE BREAKFAST 90 tablet 0   lisinopril (ZESTRIL) 40 MG tablet Take 1 tablet (40  mg total) by mouth daily. 90 tablet 3   metFORMIN (GLUCOPHAGE-XR) 500 MG 24 hr tablet TAKE ONE TABLET BY MOUTH TWICE A DAY AFTER A MEAL 180 tablet 0   pantoprazole (PROTONIX) 40 MG tablet Take 1 tablet (40 mg total) by mouth daily. 30 tablet 11   pregabalin (LYRICA) 100 MG capsule Take 100 mg by mouth 2 (two) times daily.     vitamin B-12 (CYANOCOBALAMIN) 500 MCG tablet Take 500 mcg by mouth daily.     Accu-Chek FastClix Lancets MISC USE TO TEST 4 TIMES DAILY. 102 each 0   Accu-Chek FastClix Lancets MISC USE TO TEST 4 TIMES DAILY. 102 each 0   Accu-Chek FastClix Lancets MISC USE TO TEST 4 TIMES DAILY. 102 each 0   Accu-Chek FastClix Lancets MISC USE TO TEST 4 TIMES DAILY. 102 each 0   Accu-Chek FastClix Lancets MISC USE TO TEST 4 TIMES DAILY. 102 each 0   ACCU-CHEK GUIDE test strip USE TO TEST 4 TIMES DAILY. 100 strip 0   albuterol (VENTOLIN HFA) 108 (90 Base) MCG/ACT inhaler INHALE 4 PUFFS INTO THE LUNGS EVERY 6 HOURS AS NEEDED FOR WHEEZING OR SHORTNESS OF BREATH 18 g 0   aspirin EC 81 MG tablet Take 81 mg by mouth daily.     Continuous Glucose Sensor (DEXCOM G6 SENSOR) MISC CHANGE SENSOR EVERY 10 DAYS AS DIRECTED. 3  each 5   Continuous Glucose Transmitter (DEXCOM G6 TRANSMITTER) MISC CHANGE TRANSMITTER EVERY 90 DAYS AS DIRECTED. 1 each 0   diclofenac Sodium (VOLTAREN) 1 % GEL APPLY 2 GRAMS TO AFFECTED AREAS 2 TIMES A DAY AS NEEDED 100 g 0   EPINEPHrine 0.3 mg/0.3 mL IJ SOAJ injection Inject 0.3 mg into the muscle as needed for anaphylaxis. 2 each 1   fluticasone (FLONASE) 50 MCG/ACT nasal spray Place 1 spray into both nostrils daily. 16 g 3   ketoconazole (NIZORAL) 2 % cream APPLY TO AFFECTED AREAS TWICE DAILY (Patient taking differently: Apply 1 Application topically daily as needed for irritation.) 60 g 2   LITETOUCH PEN NEEDLES 31G X 8 MM MISC USE AS DIRECTED FOUR TIMES DAILY. 100 each 0   ondansetron (ZOFRAN) 4 MG tablet Take 1 tablet (4 mg total) by mouth every 6 (six) hours. (Patient taking differently: Take 4 mg by mouth every 6 (six) hours as needed for nausea or vomiting.) 12 tablet 0   OZEMPIC, 1 MG/DOSE, 4 MG/3ML SOPN INJECT 1MG  ONCE A WEEK AS DIRECTED (Patient taking differently: Takes on Wed) 9 mL 0   Sod Picosulfate-Mag Ox-Cit Acd (CLENPIQ) 10-3.5-12 MG-GM -GM/175ML SOLN Take 1 kit by mouth as directed. 350 mL 0    Results for orders placed or performed during the hospital encounter of 11/26/22 (from the past 48 hour(s))  Glucose, capillary     Status: Abnormal   Collection Time: 11/26/22 11:07 AM  Result Value Ref Range   Glucose-Capillary 127 (H) 70 - 99 mg/dL    Comment: Glucose reference range applies only to samples taken after fasting for at least 8 hours.   No results found.  Pertinent items noted in HPI and remainder of comprehensive ROS otherwise negative.  Blood pressure (!) (P) 163/69, pulse (P) 66, temperature (P) 97.9 F (36.6 C), temperature source (P) Oral, resp. rate (P) 18, height (P) 5\' 6"  (1.676 m), weight (P) 104.3 kg, SpO2 (P) 95 %.  Patient is awake and alert.  She is oriented and appropriate.  Speech is fluent.  Judgment insight are intact.  Cranial  nerve function  normal bilateral.  Motor examination reveals some mild dorsiflexion weakness in her left lower extremity otherwise motor strength intact.  Sensory examination with decrease sensation pinprick light touch in her left L5 dermatome.  Deep tendon reflexes normal active septa her Achilles reflexes are absent bilaterally.  Gait is antalgic.  Straight raising is positive on the left equivocal on the right.  Examination head ears eyes nose and throat is unremarked.  Chest and abdomen are benign.  Extremities are free from injury deformity. Assessment/Plan L4-5 herniated nucleus pulposus with stenosis and bilateral radiculopathy.  Plan bilateral L4-5 decompressive laminotomies with right L4-5 microdiscectomy.  Risks and benefits of explained.  Patient wishes proceed.  Sandy Valencia 11/26/2022, 12:46 PM

## 2022-11-26 NOTE — Anesthesia Preprocedure Evaluation (Addendum)
Anesthesia Evaluation  Patient identified by MRN, date of birth, ID band Patient awake    Reviewed: Allergy & Precautions, NPO status , Patient's Chart, lab work & pertinent test results  Airway Mallampati: IV  TM Distance: >3 FB Neck ROM: Full    Dental  (+) Teeth Intact, Dental Advisory Given   Pulmonary asthma , sleep apnea (unable to tolerate CPAP) , COPD   Pulmonary exam normal breath sounds clear to auscultation       Cardiovascular hypertension (163/69 preop, 120-160 per pt), Pt. on medications Normal cardiovascular exam Rhythm:Regular Rate:Normal     Neuro/Psych negative neurological ROS  negative psych ROS   GI/Hepatic Neg liver ROS, hiatal hernia,GERD  Medicated and Controlled,,  Endo/Other  diabetes, Well Controlled, Type 2, Insulin Dependent, Oral Hypoglycemic AgentsHypothyroidism  Obesity BMI 37 FS 127 preop  Renal/GU negative Renal ROS  negative genitourinary   Musculoskeletal  (+) Arthritis , Osteoarthritis,    Abdominal  (+) + obese  Peds  Hematology negative hematology ROS (+)   Anesthesia Other Findings HOH Ozempic LD: 5/29  Reproductive/Obstetrics negative OB ROS                             Anesthesia Physical Anesthesia Plan  ASA: 3  Anesthesia Plan: General   Post-op Pain Management: Tylenol PO (pre-op)*   Induction: Intravenous  PONV Risk Score and Plan: 3 and Ondansetron, Dexamethasone, Midazolam and Treatment may vary due to age or medical condition  Airway Management Planned: Oral ETT  Additional Equipment: None  Intra-op Plan:   Post-operative Plan: Extubation in OR  Informed Consent: I have reviewed the patients History and Physical, chart, labs and discussed the procedure including the risks, benefits and alternatives for the proposed anesthesia with the patient or authorized representative who has indicated his/her understanding and acceptance.        Plan Discussed with: CRNA  Anesthesia Plan Comments:         Anesthesia Quick Evaluation

## 2022-11-26 NOTE — Anesthesia Procedure Notes (Cosign Needed)
Procedure Name: Intubation Date/Time: 11/26/2022 1:51 PM  Performed by: Carolynne Edouard, RNPre-anesthesia Checklist: Patient identified, Emergency Drugs available, Suction available and Patient being monitored Patient Re-evaluated:Patient Re-evaluated prior to induction Oxygen Delivery Method: Circle system utilized Preoxygenation: Pre-oxygenation with 100% oxygen Induction Type: IV induction Ventilation: Mask ventilation without difficulty and Oral airway inserted - appropriate to patient size Laryngoscope Size: Glidescope and 4 Grade View: Grade I Tube type: Oral Tube size: 7.0 mm Number of attempts: 1 Airway Equipment and Method: Stylet and Oral airway Placement Confirmation: ETT inserted through vocal cords under direct vision, positive ETCO2 and breath sounds checked- equal and bilateral Secured at: 21 cm Tube secured with: Tape Dental Injury: Teeth and Oropharynx as per pre-operative assessment

## 2022-11-26 NOTE — Transfer of Care (Signed)
Immediate Anesthesia Transfer of Care Note  Patient: Sandy Valencia  Procedure(s) Performed: Microdiscectomy - bilateral - Lumbar four-Lumbar five (Bilateral: Back)  Patient Location: PACU  Anesthesia Type:General  Level of Consciousness: drowsy and patient cooperative  Airway & Oxygen Therapy: Patient Spontanous Breathing and Patient connected to face mask oxygen  Post-op Assessment: Report given to RN and Post -op Vital signs reviewed and stable  Post vital signs: Reviewed and stable  Last Vitals:  Vitals Value Taken Time  BP    Temp    Pulse    Resp    SpO2      Last Pain:  Vitals:   11/26/22 1133  TempSrc:   PainSc: 0-No pain         Complications: No notable events documented.

## 2022-11-26 NOTE — Brief Op Note (Signed)
11/26/2022  3:26 PM  PATIENT:  Sandy Valencia  62 y.o. female  PRE-OPERATIVE DIAGNOSIS:  Herniated Nucleus Pulposus  POST-OPERATIVE DIAGNOSIS:  Herniated Nucleus Pulposus  PROCEDURE:  Procedure(s): Microdiscectomy - bilateral - Lumbar four-Lumbar five (Bilateral)  SURGEON:  Surgeon(s) and Role:    Julio Sicks, MD - Primary  PHYSICIAN ASSISTANT:   ASSISTANTSMarland Mcalpine   ANESTHESIA:   general  EBL:  150 mL   BLOOD ADMINISTERED:none  DRAINS: none   LOCAL MEDICATIONS USED:  MARCAINE     SPECIMEN:  No Specimen  DISPOSITION OF SPECIMEN:  N/A  COUNTS:  YES  TOURNIQUET:  * No tourniquets in log *  DICTATION: .Dragon Dictation  PLAN OF CARE: Admit for overnight observation  PATIENT DISPOSITION:  PACU - hemodynamically stable.   Delay start of Pharmacological VTE agent (>24hrs) due to surgical blood loss or risk of bleeding: yes

## 2022-11-26 NOTE — Anesthesia Postprocedure Evaluation (Signed)
Anesthesia Post Note  Patient: Calee W Sedlack  Procedure(s) Performed: Microdiscectomy - bilateral - Lumbar four-Lumbar five (Bilateral: Back)     Patient location during evaluation: PACU Anesthesia Type: General Level of consciousness: awake and alert, oriented and patient cooperative Pain management: pain level controlled Vital Signs Assessment: post-procedure vital signs reviewed and stable Respiratory status: spontaneous breathing, nonlabored ventilation and respiratory function stable Cardiovascular status: blood pressure returned to baseline and stable Postop Assessment: no apparent nausea or vomiting Anesthetic complications: no   No notable events documented.  Last Vitals:  Vitals:   11/26/22 1545 11/26/22 1600  BP: 138/88 (!) 139/55  Pulse: 68 66  Resp: 19 18  Temp:    SpO2: 96% 99%    Last Pain:  Vitals:   11/26/22 1600  TempSrc:   PainSc: 0-No pain    LLE Motor Response: Purposeful movement (11/26/22 1600)   RLE Motor Response: Purposeful movement (11/26/22 1600)        Lannie Fields

## 2022-11-27 ENCOUNTER — Encounter (HOSPITAL_COMMUNITY): Payer: Self-pay | Admitting: Neurosurgery

## 2022-11-27 DIAGNOSIS — M5116 Intervertebral disc disorders with radiculopathy, lumbar region: Secondary | ICD-10-CM | POA: Diagnosis not present

## 2022-11-27 LAB — GLUCOSE, CAPILLARY: Glucose-Capillary: 217 mg/dL — ABNORMAL HIGH (ref 70–99)

## 2022-11-27 MED ORDER — CYCLOBENZAPRINE HCL 10 MG PO TABS: 10.0000 mg | ORAL_TABLET | Freq: Three times a day (TID) | ORAL | 0 refills | Status: DC | PRN

## 2022-11-27 NOTE — Discharge Summary (Signed)
Physician Discharge Summary     Providing Compassionate, Quality Care - Together   Patient ID: Sandy Valencia MRN: [id number] DOB/AGE: 12/05/1960 62 y.o.  Admit date: 11/26/2022 Discharge date: 11/27/2022  Admission Diagnoses: Lumbar radiculopathy  Discharge Diagnoses:  Principal Problem:   Lumbar radiculopathy   Discharged Condition: good  Hospital Course: Patient underwent a bilateral L4-5 decompressive laminotomies and foraminotomies with right L4-5 microdiscectomy by Dr. Jordan Likes on 11/26/2022. She was admitted to 3C02 following recovery from anesthesia in the PACU. Her postoperative course has been uncomplicated. She has worked with both physical and occupational therapies who feel the patient is ready for discharge home. She is ambulating independently and without difficulty. She is tolerating a normal diet. She is not having any bowel or bladder dysfunction. Her pain is well-controlled with oral pain medication. She is ready for discharge home.   Consults: None  Significant Diagnostic Studies: radiology: DG Lumbar Spine 1 View  Result Date: 11/26/2022 CLINICAL DATA:  Intraoperative localization for micro discectomy EXAM: LUMBAR SPINE - 1 VIEW COMPARISON:  02/15/2022 lumbar spine radiographs FINDINGS: Cross-table lateral image for localization in the lumbar spine, with instrumentation posterior to the L5-S1 level. No acute fracture. IMPRESSION: Intraoperative localization of the L5-S1 level. Electronically Signed   By: Wiliam Ke M.D.   On: 11/26/2022 16:25     Treatments: surgery:  Bilateral L4-5 decompressive laminotomies and foraminotomies with right L4-5 microdiscectomy   Discharge Exam: Blood pressure 115/68, pulse 75, temperature 98.5 F (36.9 C), temperature source Oral, resp. rate 16, height 5\' 6"  (1.676 m), weight 104.3 kg, SpO2 100 %.  Alert and oriented x 4 PERRLA CN II-XII grossly intact MAE, Strength and sensation intact Incision is covered with Honeycomb  dressing and Steri Strips; Dressing is clean, dry, and intact   Disposition: Discharge disposition: 01-Home or Self Care       Discharge Instructions     Call MD for:  difficulty breathing, headache or visual disturbances   Complete by: As directed    Call MD for:  hives   Complete by: As directed    Call MD for:  persistant nausea and vomiting   Complete by: As directed    Call MD for:  redness, tenderness, or signs of infection (pain, swelling, redness, odor or green/yellow discharge around incision site)   Complete by: As directed    Call MD for:  severe uncontrolled pain   Complete by: As directed    Call MD for:  temperature >100.4   Complete by: As directed    Diet - low sodium heart healthy   Complete by: As directed    Increase activity slowly   Complete by: As directed       Allergies as of 11/27/2022       Reactions   Kiwi Extract Anaphylaxis, Swelling, Palpitations   Other Anaphylaxis   Walnuts (avoids all tree nuts)   Statins    Transaminase elevation        Medication List     TAKE these medications    Accu-Chek FastClix Lancets Misc USE TO TEST 4 TIMES DAILY.   Accu-Chek FastClix Lancets Misc USE TO TEST 4 TIMES DAILY.   Accu-Chek FastClix Lancets Misc USE TO TEST 4 TIMES DAILY.   Accu-Chek FastClix Lancets Misc USE TO TEST 4 TIMES DAILY.   Accu-Chek FastClix Lancets Misc USE TO TEST 4 TIMES DAILY.   Accu-Chek Guide test strip Generic drug: glucose blood USE TO TEST 4 TIMES DAILY.   aspirin EC  81 MG tablet Take 81 mg by mouth daily.   cetirizine 10 MG tablet Commonly known as: ZYRTEC Take 1 tablet (10 mg total) by mouth daily as needed for allergies. What changed: when to take this   cyclobenzaprine 10 MG tablet Commonly known as: FLEXERIL Take 1 tablet (10 mg total) by mouth 3 (three) times daily as needed for muscle spasms.   Dexcom G6 Sensor Misc CHANGE SENSOR EVERY 10 DAYS AS DIRECTED.   Dexcom G6 Transmitter  Misc CHANGE TRANSMITTER EVERY 90 DAYS AS DIRECTED.   diclofenac Sodium 1 % Gel Commonly known as: VOLTAREN APPLY 2 GRAMS TO AFFECTED AREAS 2 TIMES A DAY AS NEEDED   EPINEPHrine 0.3 mg/0.3 mL Soaj injection Commonly known as: EPI-PEN Inject 0.3 mg into the muscle as needed for anaphylaxis.   fluticasone 50 MCG/ACT nasal spray Commonly known as: FLONASE Place 1 spray into both nostrils daily.   HYDROcodone-acetaminophen 7.5-325 MG tablet Commonly known as: NORCO Take 1 tablet by mouth 3 (three) times daily as needed for moderate pain.   ketoconazole 2 % cream Commonly known as: NIZORAL APPLY TO AFFECTED AREAS TWICE DAILY What changed: See the new instructions.   Lantus SoloStar 100 UNIT/ML Solostar Pen Generic drug: insulin glargine Inject 60 Units into the skin at bedtime. What changed: how much to take   levothyroxine 75 MCG tablet Commonly known as: SYNTHROID TAKE ONE (1) TABLET BY MOUTH EVERY DAY BEFORE BREAKFAST   lisinopril 40 MG tablet Commonly known as: ZESTRIL Take 1 tablet (40 mg total) by mouth daily.   Litetouch Pen Needles 31G X 8 MM Misc Generic drug: Insulin Pen Needle USE AS DIRECTED FOUR TIMES DAILY.   metFORMIN 500 MG 24 hr tablet Commonly known as: GLUCOPHAGE-XR TAKE ONE TABLET BY MOUTH TWICE A DAY AFTER A MEAL   NovoLOG FlexPen 100 UNIT/ML FlexPen Generic drug: insulin aspart Inject 20-26 Units into the skin 3 (three) times daily with meals.   ondansetron 4 MG tablet Commonly known as: ZOFRAN Take 1 tablet (4 mg total) by mouth every 6 (six) hours. What changed:  when to take this reasons to take this   Ozempic (1 MG/DOSE) 4 MG/3ML Sopn Generic drug: Semaglutide (1 MG/DOSE) INJECT 1MG  ONCE A WEEK AS DIRECTED What changed: See the new instructions.   pantoprazole 40 MG tablet Commonly known as: PROTONIX Take 1 tablet (40 mg total) by mouth daily.   pregabalin 100 MG capsule Commonly known as: LYRICA Take 100 mg by mouth 2 (two) times  daily.   Ventolin HFA 108 (90 Base) MCG/ACT inhaler Generic drug: albuterol INHALE 4 PUFFS INTO THE LUNGS EVERY 6 HOURS AS NEEDED FOR WHEEZING OR SHORTNESS OF BREATH   vitamin B-12 500 MCG tablet Commonly known as: CYANOCOBALAMIN Take 500 mcg by mouth daily.   Vitamin D-3 25 MCG (1000 UT) Caps Take 1,000 Units by mouth daily.        Follow-up Information     Julio Sicks, MD. Go on 12/12/2022.   Specialty: Neurosurgery Why: First post op appointment is on 12/12/2022 at 4:15 PM. Contact information: [address] [place] [address] [phone]                 Signed: Val Eagle, DNP, AGNP-C Nurse Practitioner  [facility] [address], [address], [place], [place] [address] P: [phone]    F: [phone]  11/27/2022, 9:34 AM

## 2022-11-27 NOTE — Progress Notes (Signed)
Patient alert and oriented, mae's well, voiding adequate amount of urine, swallowing without difficulty, no c/o pain at time of discharge. Patient discharged home with family. Script and discharged instructions given to patient. Patient and family stated understanding of instructions given. Patient has an appointment with Dr. Pool  

## 2022-11-27 NOTE — Discharge Instructions (Signed)
Wound Care Keep incision covered and dry until post op day 3. You may remove the Honeycomb dressing on post op day 3. Leave steri-strips on back.  They will fall off by themselves. Do not put any creams, lotions, or ointments on incision. You are fine to shower. Let water run over incision and pat dry.  Activity Walk each and every day, increasing distance each day. No lifting greater than 5 lbs.  Avoid excessive back motion. No driving for 2 weeks; may ride as a passenger locally.  Diet Resume your normal diet.   Return to Work Will be discussed at your follow up appointment.  Call Your Doctor If Any of These Occur Redness, drainage, or swelling at the wound.  Temperature greater than 101 degrees. Severe pain not relieved by pain medication. Incision starts to come apart.  Follow Up Appt Call [phone] today for appointment in 1-2 weeks if you don't already have one or for any problems.

## 2022-11-27 NOTE — Evaluation (Addendum)
Occupational Therapy Evaluation and Discharge Patient Details Name: Sandy Valencia MRN: [id number] DOB: 10/20/1960 Today's Date: 11/27/2022   History of Present Illness Pt is a 62 yo female s/p bilateral L4-5 decompressive laminotomies and foraminotomies with right L4-5 microdiscectomy due to significant disc generation with a broad-based rightward L4-5 disc herniation with significant stenosis.   Clinical Impression   This 62 yo female admitted and underwent above presents to acute OT with all education completed, no further OT needs, we will sign off.      Recommendations for follow up therapy are one component of a multi-disciplinary discharge planning process, led by the attending physician.  Recommendations may be updated based on patient status, additional functional criteria and insurance authorization.   Assistance Recommended at Discharge PRN  Patient can return home with the following Assistance with cooking/housework    Functional Status Assessment  Patient has had a recent decline in their functional status and demonstrates the ability to make significant improvements in function in a reasonable and predictable amount of time. (without further need for skilled OT, education and post op back handout provided)  Equipment Recommendations  None recommended by OT       Precautions / Restrictions Precautions Precautions: Back Precaution Booklet Issued: Yes (comment) Required Braces or Orthoses:  (no brace per orders) Restrictions Weight Bearing Restrictions: No      Mobility Bed Mobility Overal bed mobility: Modified Independent                  Transfers Overall transfer level: Independent                        Balance Overall balance assessment: Independent                                         ADL either performed or assessed with clinical judgement   ADL Overall ADL's : Modified independent                                        General ADL Comments: Educated on sit<>stand to keep back more straight, use of wet wipes for back peri care, use of 2 cups for brushing teeth at sink, use of pillows for positiong in bed(RN was able to get her 2 pieces of egg crate foam for home use), crossing legs to get to feet to wash them and put socks/shoes on.     Vision Patient Visual Report: No change from baseline              Pertinent Vitals/Pain Pain Assessment Pain Assessment: No/denies pain     Hand Dominance Right   Extremity/Trunk Assessment Upper Extremity Assessment Upper Extremity Assessment: Overall WFL for tasks assessed           Communication Communication Communication: No difficulties   Cognition Arousal/Alertness: Awake/alert Behavior During Therapy: WFL for tasks assessed/performed Overall Cognitive Status: Within Functional Limits for tasks assessed                                 General Comments: Does need cues to be consistent in following back precautions  Home Living Family/patient expects to be discharged to:: Private residence Living Arrangements: Alone Available Help at Discharge: Family Type of Home: House Home Access: Level entry     Home Layout: One level     Bathroom Shower/Tub: Producer, television/film/video: Standard     Home Equipment: None   Additional Comments: Going to her dtrs house to stay first. She is a Research officer, political party      Prior Functioning/Environment Prior Level of Function : Independent/Modified Independent;Working/employed;Driving                        OT Problem List: Decreased range of motion         OT Goals(Current goals can be found in the care plan section) Acute Rehab OT Goals Patient Stated Goal: to go home today         AM-PAC OT "6 Clicks" Daily Activity     Outcome Measure Help from another person eating meals?: None Help from another person taking care of  personal grooming?: None Help from another person toileting, which includes using toliet, bedpan, or urinal?: None Help from another person bathing (including washing, rinsing, drying)?: None Help from another person to put on and taking off regular upper body clothing?: None Help from another person to put on and taking off regular lower body clothing?: None 6 Click Score: 24   End of Session Nurse Communication:  (pt getting ready to wash up and will be ready to go once MD/PA comes to see hier)  Activity Tolerance: Patient tolerated treatment well Patient left:  (in bathroom washing up)                   Time: 1610-9604 OT Time Calculation (min): 32 min Charges:  OT General Charges $OT Visit: 1 Visit OT Evaluation $OT Eval Moderate Complexity: 1 Mod OT Treatments $Self Care/Home Management : 8-22 mins  Lindon Romp OT Acute Rehabilitation Services Office [phone]    Evette Georges 11/27/2022, 9:44 AM

## 2022-11-28 MED FILL — Thrombin For Soln 5000 Unit: CUTANEOUS | Qty: 2 | Status: AC

## 2022-12-04 DIAGNOSIS — M25549 Pain in joints of unspecified hand: Secondary | ICD-10-CM | POA: Diagnosis not present

## 2022-12-04 DIAGNOSIS — M545 Low back pain, unspecified: Secondary | ICD-10-CM | POA: Diagnosis not present

## 2022-12-04 DIAGNOSIS — G894 Chronic pain syndrome: Secondary | ICD-10-CM | POA: Diagnosis not present

## 2022-12-04 DIAGNOSIS — M5116 Intervertebral disc disorders with radiculopathy, lumbar region: Secondary | ICD-10-CM | POA: Diagnosis not present

## 2022-12-11 ENCOUNTER — Other Ambulatory Visit: Payer: Self-pay | Admitting: Internal Medicine

## 2022-12-13 ENCOUNTER — Other Ambulatory Visit: Payer: Self-pay | Admitting: Internal Medicine

## 2022-12-13 ENCOUNTER — Other Ambulatory Visit (HOSPITAL_COMMUNITY): Payer: Self-pay | Admitting: Internal Medicine

## 2022-12-13 DIAGNOSIS — Z1231 Encounter for screening mammogram for malignant neoplasm of breast: Secondary | ICD-10-CM

## 2022-12-17 ENCOUNTER — Other Ambulatory Visit: Payer: Self-pay | Admitting: Nurse Practitioner

## 2022-12-18 ENCOUNTER — Other Ambulatory Visit: Payer: Self-pay | Admitting: Nurse Practitioner

## 2022-12-19 ENCOUNTER — Other Ambulatory Visit: Payer: Self-pay | Admitting: Nurse Practitioner

## 2023-01-01 LAB — HM DIABETES EYE EXAM

## 2023-01-08 DIAGNOSIS — Z79899 Other long term (current) drug therapy: Secondary | ICD-10-CM | POA: Diagnosis not present

## 2023-01-08 DIAGNOSIS — M5116 Intervertebral disc disorders with radiculopathy, lumbar region: Secondary | ICD-10-CM | POA: Diagnosis not present

## 2023-01-08 DIAGNOSIS — G894 Chronic pain syndrome: Secondary | ICD-10-CM | POA: Diagnosis not present

## 2023-01-08 DIAGNOSIS — Z79891 Long term (current) use of opiate analgesic: Secondary | ICD-10-CM | POA: Diagnosis not present

## 2023-01-08 DIAGNOSIS — M545 Low back pain, unspecified: Secondary | ICD-10-CM | POA: Diagnosis not present

## 2023-01-09 ENCOUNTER — Other Ambulatory Visit: Payer: Self-pay | Admitting: Internal Medicine

## 2023-01-09 DIAGNOSIS — L03032 Cellulitis of left toe: Secondary | ICD-10-CM | POA: Diagnosis not present

## 2023-01-09 DIAGNOSIS — L03031 Cellulitis of right toe: Secondary | ICD-10-CM | POA: Diagnosis not present

## 2023-01-09 DIAGNOSIS — L600 Ingrowing nail: Secondary | ICD-10-CM | POA: Diagnosis not present

## 2023-01-09 DIAGNOSIS — B351 Tinea unguium: Secondary | ICD-10-CM | POA: Diagnosis not present

## 2023-01-11 ENCOUNTER — Other Ambulatory Visit (HOSPITAL_COMMUNITY): Payer: Self-pay | Admitting: Neurosurgery

## 2023-01-11 DIAGNOSIS — M5416 Radiculopathy, lumbar region: Secondary | ICD-10-CM

## 2023-01-14 ENCOUNTER — Ambulatory Visit (HOSPITAL_COMMUNITY)
Admission: RE | Admit: 2023-01-14 | Discharge: 2023-01-14 | Disposition: A | Discharge status: Home / Self Care | Payer: Medicaid Other | Source: Ambulatory Visit | Attending: Internal Medicine | Admitting: Internal Medicine

## 2023-01-14 DIAGNOSIS — Z1231 Encounter for screening mammogram for malignant neoplasm of breast: Secondary | ICD-10-CM | POA: Insufficient documentation

## 2023-01-22 ENCOUNTER — Encounter: Payer: Self-pay | Admitting: Internal Medicine

## 2023-01-22 ENCOUNTER — Other Ambulatory Visit: Payer: Self-pay | Admitting: Nurse Practitioner

## 2023-01-22 ENCOUNTER — Ambulatory Visit: Payer: Medicaid Other | Admitting: Internal Medicine

## 2023-01-22 ENCOUNTER — Ambulatory Visit (HOSPITAL_COMMUNITY)
Admission: RE | Admit: 2023-01-22 | Discharge: 2023-01-22 | Disposition: A | Discharge status: Home / Self Care | Payer: Medicaid Other | Source: Ambulatory Visit | Attending: Neurosurgery | Admitting: Neurosurgery

## 2023-01-22 VITALS — BP 138/80 | HR 87 | Ht 66.0 in | Wt 230.6 lb

## 2023-01-22 DIAGNOSIS — M5416 Radiculopathy, lumbar region: Secondary | ICD-10-CM

## 2023-01-22 DIAGNOSIS — I10 Essential (primary) hypertension: Secondary | ICD-10-CM | POA: Diagnosis not present

## 2023-01-22 DIAGNOSIS — I8391 Asymptomatic varicose veins of right lower extremity: Secondary | ICD-10-CM

## 2023-01-22 DIAGNOSIS — Z0001 Encounter for general adult medical examination with abnormal findings: Secondary | ICD-10-CM

## 2023-01-22 DIAGNOSIS — M48061 Spinal stenosis, lumbar region without neurogenic claudication: Secondary | ICD-10-CM | POA: Diagnosis not present

## 2023-01-22 DIAGNOSIS — M4726 Other spondylosis with radiculopathy, lumbar region: Secondary | ICD-10-CM | POA: Diagnosis not present

## 2023-01-22 DIAGNOSIS — E039 Hypothyroidism, unspecified: Secondary | ICD-10-CM | POA: Diagnosis not present

## 2023-01-22 DIAGNOSIS — Z794 Long term (current) use of insulin: Secondary | ICD-10-CM

## 2023-01-22 DIAGNOSIS — E1165 Type 2 diabetes mellitus with hyperglycemia: Secondary | ICD-10-CM

## 2023-01-22 DIAGNOSIS — M5116 Intervertebral disc disorders with radiculopathy, lumbar region: Secondary | ICD-10-CM | POA: Diagnosis not present

## 2023-01-22 MED ORDER — GADOBUTROL 1 MMOL/ML IV SOLN
10.0000 mL | Freq: Once | INTRAVENOUS | Status: AC | PRN
  Administered 2023-01-22: 10 mL via INTRAVENOUS

## 2023-01-22 NOTE — Assessment & Plan Note (Signed)
Lab Results  Component Value Date   TSH 2.510 10/16/2022   On levothyroxine 75 mcg QD, followed by Endocrinology

## 2023-01-22 NOTE — Assessment & Plan Note (Signed)
On Lyrica and Norco PRN S/p microdiscectomy L4-5 recently Followed by spine surgery and pain clinic

## 2023-01-22 NOTE — Assessment & Plan Note (Addendum)
No overt leg swelling She has mild pain around varicose vein area Offered vascular surgery referral, she prefers to wait for now Leg elevation advised

## 2023-01-22 NOTE — Assessment & Plan Note (Addendum)
Lab Results  Component Value Date   HGBA1C 8.6 (A) 10/22/2022   Uncontrolled Follows up with Endocrinology On Lantus 70 units and NovoLog ISS Continue metformin and Ozempic Tried to give her statin in the past, had transaminitis -will try again to add statin later Takes Lyrica for neuropathy/low back pain

## 2023-01-22 NOTE — Assessment & Plan Note (Signed)

## 2023-01-22 NOTE — Assessment & Plan Note (Addendum)
BP Readings from Last 1 Encounters:  01/22/23 138/80   Well-controlled with Lisinopril 40 mg QD Counseled for compliance with the medications Advised DASH diet and moderate exercise/walking, at least 150 mins/week

## 2023-01-22 NOTE — Progress Notes (Signed)
Established Patient Office Visit  Subjective:  Patient ID: Sandy Valencia, female    DOB: 24-Apr-1961  Age: 62 y.o. MRN: [id number]  CC:  Chief Complaint  Patient presents with   Annual Exam    HPI [name] is a 62 y.o. female with past medical history of HTN, DM, hypothyroidism, NAFLD, GERD, chronic pain syndrome and morbid obesity who presents for annual physical.  HTN: Her BP was wnl today.  She is on lisinopril 40 mg QD. She denies any headache, dizziness, chest pain, dyspnea or palpitations.  Type II DM: Followed by endocrinology.  She has been taking Ozempic 1 mg qw.  She takes Lantus 70 units nightly and takes NovoLog ISS in addition to metformin.  Her blood glucose has been ranging around 150-200 most of the time, with morning readings above 200. She has Dexcom currently.   Insomnia: She reports chronic insomnia.  She admits that she uses phone in her bed to help her fall asleep.  Denies anhedonia, SI or HI currently.  She wants to try taking melatonin.  After discussion, she agrees to avoid using electronic devices in her bedroom.  She had microdiscectomy L4-L5 for chronic low back pain.  She has noticed mild improvement in low back pain.  Her pain is intermittent, sharp, and better with bending.  She had follow-up MRI of lumbar spine today, result is unavailable for review.  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   Anaphylactic shock, unspecified, subsequent encounter    Anemia, iron deficiency 02/14/2015   patient is not aware this dx   Angio-edema    Arthritis    Asthma    Mild intermittent asthma   Asymptomatic varicose veins of right lower extremity    Chronic obstructive pulmonary disease with (acute) exacerbation (HCC)    patient denies this dx   Chronic rhinitis 05/21/2018   COVID-19 2019   Diabetes mellitus    type 2   Essential (primary) hypertension     Family history of colon cancer 09/03/2012   Fatty liver    Gastro-esophageal reflux disease without esophagitis    GERD (gastroesophageal reflux disease)    Hearing loss    wears hearing aid   History of colonic polyps 08/26/2017   Hypercholesteremia    Hypertension    Hypothyroidism    Low back pain    and left leg pain   Mass of finger, right s/p surgical excision (mucoid cyst) 04/17/18 04/22/2018   Mucous cyst of digit of right hand    Obesity, unspecified    Other adverse food reactions, not elsewhere classified, initial encounter    Personal history of noncompliance with medical treatment, presenting hazards to health 05/18/2015   Rash and other nonspecific skin eruption    Restless legs syndrome    patient denies this dx   Sleep apnea    pt siad, "i had small amount but could not tolerate CPAP and they said it was ok since it was mild.   Unspecified asthma, uncomplicated    Viral URI 07/13/2022   Zoster without complications     Past Surgical History:  Procedure Laterality Date   ABDOMINAL HYSTERECTOMY     BACK SURGERY  11/26/2022   BIOPSY  11/08/2022   Procedure: BIOPSY;  Surgeon: Corbin Ade, MD;  [facility]: [facility];  Service: Endoscopy;;   CATARACT EXTRACTION W/PHACO Left 03/09/2022  Procedure: CATARACT EXTRACTION PHACO AND INTRAOCULAR LENS PLACEMENT (IOC);  Surgeon: Fabio Pierce, MD;  Location: [facility];  Service: Ophthalmology;  Laterality: Left;  CDE 7.89   CATARACT EXTRACTION W/PHACO Right 03/23/2022   Procedure: CATARACT EXTRACTION PHACO AND INTRAOCULAR LENS PLACEMENT (IOC);  Surgeon: Fabio Pierce, MD;  Location: [facility];  Service: Ophthalmology;  Laterality: Right;  CDE: 2.63   CHOLECYSTECTOMY     COLONOSCOPY N/A 09/22/2012   RMR: colonic polyps -removed as described above. tubular adenoma, next TCS 09/2017   COLONOSCOPY WITH PROPOFOL N/A 09/23/2017   Surgeon: Corbin Ade, MD; internal hemorrhoids, three 5-6 mm polyps removed, diverticulosis in  sigmoid and descending colon.  Pathology with tubular adenomas and rectal hyperplastic polyp.  Recommended 5 year surveillance.   COLONOSCOPY WITH PROPOFOL N/A 11/08/2022   Procedure: COLONOSCOPY WITH PROPOFOL;  Surgeon: Corbin Ade, MD;  [facility]: [facility];  Service: Endoscopy;  Laterality: N/A;  815am, asa 3   CYST EXCISION Left 07/19/2016   Procedure: CYST REMOVAL LEFT RING FINGER;  Surgeon: Vickki Hearing, MD;  Location: [facility];  Service: Orthopedics;  Laterality: Left;   CYST REMOVAL LEG Right    foot   ESOPHAGOGASTRODUODENOSCOPY N/A 10/21/2014   RMR: Small hiatal hernia; otherwise normal EGD status post passage of a Maloney dilator.    ESOPHAGOGASTRODUODENOSCOPY (EGD) WITH PROPOFOL N/A 11/08/2022   Procedure: ESOPHAGOGASTRODUODENOSCOPY (EGD) WITH PROPOFOL;  Surgeon: Corbin Ade, MD;  [facility];  Service: Endoscopy;  Laterality: N/A;   EXCISION MASS UPPER EXTREMETIES Right 04/17/2018   Procedure: EXCISION MASS UPPER EXTREMETIES right ring finger;  Surgeon: Vickki Hearing, MD;  Location: [facility];  Service: Orthopedics;  Laterality: Right;   LUMBAR LAMINECTOMY/DECOMPRESSION MICRODISCECTOMY Bilateral 11/26/2022   Procedure: Microdiscectomy - bilateral - Lumbar four-Lumbar five;  Surgeon: Julio Sicks, MD;  Location[facility];  Service: Neurosurgery;  Laterality: Bilateral;   MALONEY DILATION N/A 10/21/2014   Procedure: Elease Hashimoto DILATION;  Surgeon: Corbin Ade, MD;  [facility];  Service: Endoscopy;  Laterality: N/A;   MALONEY DILATION N/A 11/08/2022   Procedure: Elease Hashimoto DILATION;  Surgeon: Corbin Ade, MD;  [facility];  Service: Endoscopy;  Laterality: N/A;   MIDDLE EAR SURGERY Right    patched hole in ear drum   POLYPECTOMY  09/23/2017   Procedure: POLYPECTOMY;  Surgeon: Corbin Ade, MD;  [facility];  Service: Endoscopy;;  polyp hepatic flexure polyp cs, splenic flexure polyp cs, rectal polyp cs   POLYPECTOMY   11/08/2022   Procedure: POLYPECTOMY;  Surgeon: Corbin Ade, MD;  [facility];  Service: Endoscopy;;   TOTAL ABDOMINAL HYSTERECTOMY      Family History  Problem Relation Age of Onset   Colon cancer Mother        diagnosed age 46, underwent surgical resection, metastatic disease several years later   Diabetes Mother    Heart attack Father    Diabetes Sister    Hypothyroidism Brother    Diabetes Sister    Diabetes Sister    Diabetes Sister    Hypothyroidism Sister    Allergic rhinitis Neg Hx    Angioedema Neg Hx    Asthma Neg Hx    Atopy Neg Hx    Eczema Neg Hx    Immunodeficiency Neg Hx    Urticaria Neg Hx     Social History   Socioeconomic History   Marital status: Divorced    Spouse name: Not on file  Number of children: 2   Years of education: Not on file   Highest education level: Not on file  Occupational History   Not on file  Tobacco Use   Smoking status: Never   Smokeless tobacco: Never  Vaping Use   Vaping status: Never Used  Substance and Sexual Activity   Alcohol use: No    Alcohol/week: 0.0 standard drinks of alcohol   Drug use: No   Sexual activity: Not Currently    Birth control/protection: Surgical    Comment: Hysterectomy  Other Topics Concern   Not on file  Social History Narrative   Lives with grandson (since born)      Enjoys: vacation, travel, concerts      Diet: eats all food groups    Caffeine: diet Pepsi daily    Water:  8 cups daily       Wears seat belt    Does use phone   Smoke Retail buyer in safe area    Social Determinants of Health   Financial Resource Strain: Low Risk  (12/02/2019)   Overall Financial Resource Strain (CARDIA)    Difficulty of Paying Living Expenses: Not hard at all  Food Insecurity: No Food Insecurity (12/02/2019)   Hunger Vital Sign    Worried About Running Out of Food in the Last Year: Never true    Ran Out of Food in the Last Year: Never true  Transportation Needs: No  Transportation Needs (12/02/2019)   PRAPARE - Administrator, Civil Service (Medical): No    Lack of Transportation (Non-Medical): No  Physical Activity: Insufficiently Active (12/02/2019)   Exercise Vital Sign    Days of Exercise per Week: 2 days    Minutes of Exercise per Session: 20 min  Stress: Not on file  Social Connections: Moderately Isolated (12/02/2019)   Social Connection and Isolation Panel [NHANES]    Frequency of Communication with Friends and Family: More than three times a week    Frequency of Social Gatherings with Friends and Family: More than three times a week    Attends Religious Services: More than 4 times per year    Active Member of [employer] or Organizations: No    Attends Banker Meetings: Never    Marital Status: Divorced  Catering manager Violence: Not At Risk (12/02/2019)   Humiliation, Afraid, Rape, and Kick questionnaire    Fear of Current or Ex-Partner: No    Emotionally Abused: No    Physically Abused: No    Sexually Abused: No    Outpatient Medications Prior to Visit  Medication Sig Dispense Refill   Accu-Chek FastClix Lancets MISC USE TO TEST 4 TIMES DAILY. 102 each 0   ACCU-CHEK GUIDE test strip USE TO TEST 4 TIMES DAILY. 100 strip 0   albuterol (VENTOLIN HFA) 108 (90 Base) MCG/ACT inhaler INHALE 4 PUFFS INTO THE LUNGS EVERY 6 HOURS AS NEEDED FOR WHEEZING OR SHORTNESS OF BREATH 18 g 0   aspirin EC 81 MG tablet Take 81 mg by mouth daily.     cetirizine (ZYRTEC) 10 MG tablet Take 1 tablet (10 mg total) by mouth daily as needed for allergies. (Patient taking differently: Take 10 mg by mouth daily.) 30 tablet 5   Cholecalciferol (VITAMIN D-3) 1000 UNITS CAPS Take 1,000 Units by mouth daily.      Continuous Glucose Sensor (DEXCOM G6 SENSOR) MISC CHANGE SENSOR EVERY 10 DAYS AS DIRECTED. 3 each 5   Continuous Glucose Transmitter [facility]  G6 TRANSMITTER) MISC CHANGE TRANSMITTER EVERY 90 DAYS AS DIRECTED. 1 each 0   cyclobenzaprine  (FLEXERIL) 10 MG tablet Take 1 tablet (10 mg total) by mouth 3 (three) times daily as needed for muscle spasms. 30 tablet 0   diclofenac Sodium (VOLTAREN) 1 % GEL APPLY 2 GRAMS TO AFFECTED AREAS 2 TIMES A DAY AS NEEDED 100 g 0   EPINEPHrine 0.3 mg/0.3 mL IJ SOAJ injection Inject 0.3 mg into the muscle as needed for anaphylaxis. 2 each 1   fluticasone (FLONASE) 50 MCG/ACT nasal spray Place 1 spray into both nostrils daily. 16 g 3   HYDROcodone-acetaminophen (NORCO) 7.5-325 MG tablet Take 1 tablet by mouth 3 (three) times daily as needed for moderate pain.     insulin aspart (NOVOLOG FLEXPEN) 100 UNIT/ML FlexPen INJECT 20 TO 26 UNITS INTO THE SKIN 3 TIMES DAILY WITH MEALS 60 mL 0   ketoconazole (NIZORAL) 2 % cream APPLY TO AFFECTED AREAS TWICE DAILY (Patient taking differently: Apply 1 Application topically daily as needed for irritation.) 60 g 2   LANTUS SOLOSTAR 100 UNIT/ML Solostar Pen Inject 60 Units into the skin at bedtime. (Patient taking differently: Inject 70 Units into the skin at bedtime.) 60 mL 3   lisinopril (ZESTRIL) 40 MG tablet Take 1 tablet (40 mg total) by mouth daily. 90 tablet 3   LITETOUCH PEN NEEDLES 31G X 8 MM MISC USE AS DIRECTED FOUR TIMES DAILY. 100 each 0   metFORMIN (GLUCOPHAGE-XR) 500 MG 24 hr tablet TAKE ONE TABLET BY MOUTH TWICE A DAY AFTER A MEAL 180 tablet 0   ondansetron (ZOFRAN) 4 MG tablet Take 1 tablet (4 mg total) by mouth every 6 (six) hours. (Patient taking differently: Take 4 mg by mouth every 6 (six) hours as needed for nausea or vomiting.) 12 tablet 0   OZEMPIC, 1 MG/DOSE, 4 MG/3ML SOPN INJECT 1MG  ONCE A WEEK AS DIRECTED (Patient taking differently: Takes on Wed) 9 mL 0   pantoprazole (PROTONIX) 40 MG tablet Take 1 tablet (40 mg total) by mouth daily. 30 tablet 11   pregabalin (LYRICA) 100 MG capsule Take 100 mg by mouth 2 (two) times daily.     vitamin B-12 (CYANOCOBALAMIN) 500 MCG tablet Take 500 mcg by mouth daily.     Accu-Chek FastClix Lancets MISC USE TO  TEST 4 TIMES DAILY. 102 each 0   Accu-Chek FastClix Lancets MISC USE TO TEST 4 TIMES DAILY. 102 each 0   Accu-Chek FastClix Lancets MISC USE TO TEST 4 TIMES DAILY. 102 each 0   Accu-Chek FastClix Lancets MISC USE TO TEST 4 TIMES DAILY. 102 each 0   Accu-Chek FastClix Lancets MISC USE TO TEST 4 TIMES DAILY. 102 each 0   Accu-Chek FastClix Lancets MISC USE TO TEST 4 TIMES DAILY. 102 each 0   Accu-Chek FastClix Lancets MISC USE TO TEST 4 TIMES A DAY 102 each 0   levothyroxine (SYNTHROID) 75 MCG tablet TAKE ONE (1) TABLET BY MOUTH EVERY DAY BEFORE BREAKFAST 90 tablet 0   No facility-administered medications prior to visit.    Allergies  Allergen Reactions   Kiwi Extract Anaphylaxis, Swelling and Palpitations   Other Anaphylaxis    Walnuts (avoids all tree nuts)   Statins     Transaminase elevation    ROS Review of Systems  Constitutional:  Negative for chills and fever.  HENT:  Negative for congestion, sinus pressure and sinus pain.   Eyes:  Negative for pain and discharge.  Respiratory:  Negative for cough  and shortness of breath.   Cardiovascular:  Negative for chest pain and palpitations.  Gastrointestinal:  Negative for abdominal pain, diarrhea, nausea and vomiting.  Endocrine: Negative for polydipsia and polyuria.  Genitourinary:  Negative for dysuria and hematuria.  Musculoskeletal:  Positive for arthralgias and back pain. Negative for neck pain and neck stiffness.  Skin:  Positive for color change (Mole over back).  Neurological:  Negative for dizziness and weakness.  Psychiatric/Behavioral:  Positive for sleep disturbance. Negative for agitation and behavioral problems.       Objective:    Physical Exam Vitals reviewed.  Constitutional:      General: She is not in acute distress.    Appearance: She is obese. She is not diaphoretic.  HENT:     Head: Normocephalic and atraumatic.     Nose: No congestion.     Mouth/Throat:     Mouth: Mucous membranes are moist.      Pharynx: No posterior oropharyngeal erythema.  Eyes:     General: No scleral icterus.    Extraocular Movements: Extraocular movements intact.  Neck:     Vascular: No carotid bruit.  Cardiovascular:     Rate and Rhythm: Normal rate and regular rhythm.     Heart sounds: Normal heart sounds. No murmur heard. Pulmonary:     Breath sounds: Normal breath sounds. No wheezing or rales.  Abdominal:     Palpations: Abdomen is soft.     Tenderness: There is no abdominal tenderness.  Musculoskeletal:     Cervical back: Neck supple. No tenderness.     Right lower leg: No edema.     Left lower leg: No edema.  Skin:    General: Skin is warm.     Findings: No rash.     Comments: Varicose veins over right lower thigh  Neurological:     General: No focal deficit present.     Mental Status: She is alert and oriented to person, place, and time.     Cranial Nerves: No cranial nerve deficit.     Sensory: No sensory deficit.     Motor: No weakness.  Psychiatric:        Mood and Affect: Mood normal.        Behavior: Behavior normal.     BP 138/80 (BP Location: Left Arm)   Pulse 87   Ht 5\' 6"  (1.676 m)   Wt 230 lb 9.6 oz (104.6 kg)   SpO2 96%   BMI 37.22 kg/m  Wt Readings from Last 3 Encounters:  01/22/23 230 lb 9.6 oz (104.6 kg)  11/26/22 230 lb (104.3 kg)  11/06/22 229 lb 0.6 oz (103.9 kg)    Lab Results  Component Value Date   TSH 2.510 10/16/2022   Lab Results  Component Value Date   WBC 8.0 09/17/2022   HGB 14.5 09/17/2022   HCT 44.4 09/17/2022   MCV 87.2 09/17/2022   PLT 237 09/17/2022   Lab Results  Component Value Date   NA 144 10/16/2022   K 4.2 10/16/2022   CO2 24 10/16/2022   GLUCOSE 100 (H) 10/16/2022   BUN 11 10/16/2022   CREATININE 0.78 10/16/2022   BILITOT 0.3 10/16/2022   ALKPHOS 105 10/16/2022   AST 27 10/16/2022   ALT 36 (H) 10/16/2022   PROT 7.0 10/16/2022   ALBUMIN 4.5 10/16/2022   CALCIUM 9.7 10/16/2022   ANIONGAP 10 09/17/2022   EGFR 86  10/16/2022   Lab Results  Component Value Date   CHOL  160 01/16/2022   Lab Results  Component Value Date   HDL 55 01/16/2022   Lab Results  Component Value Date   LDLCALC 91 01/16/2022   Lab Results  Component Value Date   TRIG 71 01/16/2022   Lab Results  Component Value Date   CHOLHDL 2.9 01/16/2022   Lab Results  Component Value Date   HGBA1C 8.6 (A) 10/22/2022      Assessment & Plan:   Problem List Items Addressed This Visit       Cardiovascular and Mediastinum   Essential hypertension    BP Readings from Last 1 Encounters:  01/22/23 138/80   Well-controlled with Lisinopril 40 mg QD Counseled for compliance with the medications Advised DASH diet and moderate exercise/walking, at least 150 mins/week      Varicose veins of right lower extremity    No overt leg swelling She has mild pain around varicose vein area Offered vascular surgery referral, she prefers to wait for now Leg elevation advised        Endocrine   Type 2 diabetes mellitus with hyperglycemia (HCC)    Lab Results  Component Value Date   HGBA1C 8.6 (A) 10/22/2022   Uncontrolled Follows up with Endocrinology On Lantus 70 units and NovoLog ISS Continue metformin and Ozempic Tried to give her statin in the past, had transaminitis -will try again to add statin later Takes Lyrica for neuropathy/low back pain      Hypothyroidism    Lab Results  Component Value Date   TSH 2.510 10/16/2022   On levothyroxine 75 mcg QD, followed by Endocrinology        Nervous and Auditory   Lumbar radiculopathy    On Lyrica and Norco PRN S/p microdiscectomy L4-5 recently Followed by spine surgery and pain clinic        Other   Encounter for general adult medical examination with abnormal findings - Primary    Physical exam as documented. Counseling done  re healthy lifestyle involving commitment to 150 minutes exercise per week, heart healthy diet, and attaining healthy weight.The importance  of adequate sleep also discussed. Changes in health habits are decided on by the patient with goals and time frames  set for achieving them. Immunization and cancer screening needs are specifically addressed at this visit.       No orders of the defined types were placed in this encounter.   Follow-up: Return in about 4 months (around 05/24/2023) for HTN.    Anabel Halon, MD

## 2023-01-22 NOTE — Patient Instructions (Signed)
Please continue to take medications as prescribed. ? ?Please continue to follow low carb diet and perform moderate exercise/walking at least 150 mins/week. ?

## 2023-01-30 ENCOUNTER — Encounter: Payer: Self-pay | Admitting: Allergy & Immunology

## 2023-01-30 ENCOUNTER — Ambulatory Visit: Payer: Medicaid Other | Admitting: Allergy & Immunology

## 2023-01-30 VITALS — BP 138/78 | HR 80 | Temp 97.6°F | Resp 20 | Ht 65.0 in | Wt 227.1 lb

## 2023-01-30 DIAGNOSIS — K219 Gastro-esophageal reflux disease without esophagitis: Secondary | ICD-10-CM | POA: Diagnosis not present

## 2023-01-30 DIAGNOSIS — J310 Chronic rhinitis: Secondary | ICD-10-CM

## 2023-01-30 DIAGNOSIS — J4520 Mild intermittent asthma, uncomplicated: Secondary | ICD-10-CM

## 2023-01-30 DIAGNOSIS — T7800XD Anaphylactic reaction due to unspecified food, subsequent encounter: Secondary | ICD-10-CM

## 2023-01-30 NOTE — Progress Notes (Signed)
FOLLOW UP  Date of Service/Encounter:  01/30/23   Assessment:   Mild intermittent asthma, uncomplicated - with worsening lung function today   Adverse food reaction (walnut, kiwi, seafood)    Gastroesophageal reflux disease - controlled with a pantoprazole   Chronic nonallergic rhinitis - with negative environmental IgE panel in 2019     Plan/Recommendations:   1. Mild intermittent asthma, uncomplicated - Lung testing looks better today, so I am pretty happy where things are headed. - We will continue with the albuterol 4 puffs as needed for coughing and wheezing.  - We are not going to make any changes at this time.  - Continue with albuterol 4 puffs every 4-6 hours as needed.   - There is no need for a controller medication at this time.   2. Gastroesophageal reflux disease - Continue with pantoprazole 40mg  daily.  3. 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 - Call us if you need an EpiPen.  - I think  4. Chronic rhinitis - Continue fluticasone one spray per nostril daily as needed.  - Continue with cetirizine 10mg  daily as needed.  5. Return in about 6 months (around 08/02/2023).  Subjective:   Sandy Valencia is a 62 y.o. female presenting today for follow up of  Chief Complaint  Patient presents with   Follow-up    Sandy Valencia has a history of the following: Patient Active Problem List   Diagnosis Date Noted   Varicose veins of right lower extremity 01/22/2023   Acute pain of left lower extremity 10/25/2022   Primary insomnia 09/18/2022   Encounter for examination following treatment at hospital 09/18/2022   Acute non-recurrent frontal sinusitis 07/20/2022   Lumbar radiculopathy 03/08/2022   Statin intolerance 01/19/2022   Encounter for general adult medical examination with abnormal findings 01/16/2022   Lumbar spondylosis 09/05/2021   Allergic rhinitis due to allergen 04/17/2021   Seborrheic keratosis 04/17/2021   Primary  osteoarthritis involving multiple joints 12/02/2019   Long-term current use of opiate analgesic 09/08/2019   Chronic low back pain 09/07/2019   Neck pain 09/07/2019   Mild intermittent asthma without complication 05/21/2018   Gastroesophageal reflux disease 05/21/2018   History of colonic polyps 08/26/2017   Hypothyroidism 04/22/2017   Mixed hyperlipidemia 12/31/2016   Type 2 diabetes mellitus with hyperglycemia (HCC) 05/18/2015   Essential hypertension 05/18/2015   Morbid obesity (HCC) 05/18/2015   Fatty liver 02/14/2015   Hiatal hernia    Dysphagia, pharyngoesophageal phase    Dysphagia 10/11/2014    History obtained from: chart review and patient.  Sandy Valencia is a 62 y.o. female presenting for a follow up visit.  She was last seen February 2024.  Echocardiogram ordered..  Will continue with albuterol as needed.  For her reflux, continue with proton daily.  She continues to avoid kiwi and walnuts.  For her rhinitis, we continue with Flonase and cetirizine.    Since the last visit, she has done well.  She is going to the [place] in September. Then she is going to the [place]. Then she is flying a hot air balloon at some point. She is going to [place] in mid December.   She had back surgery performed on June 24th.   Asthma/Respiratory Symptom History: Her breathing has been under good control. She denies any symptoms. She is not coughing or wheezing at all. She will have some issues with viral URIs. When she gets bronchitis, she typically gets treated  ith steroids and antibiotics. This is very rare, however. She has been breathing fairly well.  She has not used her albuterol in a while. She has not had steroids or antibiotics. She did have antibiotics before her surgery. She has not paid a lot for albuterol.    Allergic Rhinitis Symptom History: She has the cetirizine that she is using routinely. She has not bene using her nasal spray. She does have some intermittent watery eyes. She is  overall doing very.    Food Allergy Symptom History: She continues to avoid walnuts and kiwi. She denies any accidental exposures at all.  Two weeks ago, she was cracking walnuts. She did occasionally eat them as she shelled them. She did fine with this. She has an up to date EpiPen.  GERD Symptom History: She remains on the Protonix once daily.  Symptoms are well controlled on this regimen.   She is on Ozempic off and on for two years. She is aiming to lose around 30 more pounds. She is not going to stay on this long term. Hemoglobin A1c has been hanging out around 8 so she is continuing to work on this.   Otherwise, there have been no changes to her past medical history, surgical history, family history, or social history.    Review of systems otherwise negative other than that mentioned in the HPI.    Objective:   Blood pressure 138/78, pulse 80, temperature 97.6 F (36.4 C), resp. rate 20, height 5\' 5"  (1.651 m), weight 227 lb 2 oz (103 kg), SpO2 95%. Body mass index is 37.8 kg/m.    Physical Exam Vitals reviewed.  Constitutional:      Appearance: She is well-developed.     Comments: Extremely talkative. Boisterous.   HENT:     Head: Normocephalic and atraumatic.     Right Ear: Tympanic membrane, ear canal and external ear normal.     Left Ear: Tympanic membrane, ear canal and external ear normal.     Nose: No nasal deformity, septal deviation, mucosal edema or rhinorrhea.     Right Turbinates: Enlarged, swollen and pale.     Left Turbinates: Enlarged, swollen and pale.     Right Sinus: No maxillary sinus tenderness or frontal sinus tenderness.     Left Sinus: No maxillary sinus tenderness or frontal sinus tenderness.     Comments: No nasal polyps noted.     Mouth/Throat:     Lips: Pink.     Mouth: Mucous membranes are moist. Mucous membranes are not pale and not dry.     Pharynx: Uvula midline.     Comments: Cobblestoning present.  Eyes:     General: Lids are  normal. Allergic shiner present.        Right eye: No discharge.        Left eye: No discharge.     Conjunctiva/sclera: Conjunctivae normal.     Right eye: Right conjunctiva is not injected. No chemosis.    Left eye: Left conjunctiva is not injected. No chemosis.    Pupils: Pupils are equal, round, and reactive to light.  Cardiovascular:     Rate and Rhythm: Normal rate and regular rhythm.     Heart sounds: Normal heart sounds.  Pulmonary:     Effort: Pulmonary effort is normal. No tachypnea, accessory muscle usage or respiratory distress.     Breath sounds: Normal breath sounds. No wheezing, rhonchi or rales.     Comments: Moving air well in all lung fields.  Chest:     Chest wall: No tenderness.  Lymphadenopathy:     Cervical: No cervical adenopathy.  Skin:    Coloration: Skin is not pale.     Findings: No abrasion, erythema, petechiae or rash. Rash is not papular, urticarial or vesicular.  Neurological:     Mental Status: She is alert.  Psychiatric:        Behavior: Behavior is cooperative.      Diagnostic studies:   Spirometry: results normal (FEV1: 2.05/81%, FVC: 2.62/81%, FEV1/FVC: 78%).    Spirometry consistent with normal pattern.   Allergy Studies: none        Malachi Bonds, MD  [facility]

## 2023-01-30 NOTE — Patient Instructions (Addendum)
1. Mild intermittent asthma, uncomplicated - Lung testing looks better today, so I am pretty happy where things are headed. - We will continue with the albuterol 4 puffs as needed for coughing and wheezing.  - We are not going to make any changes at this time.  - Continue with albuterol 4 puffs every 4-6 hours as needed.   - There is no need for a controller medication at this time.   2. Gastroesophageal reflux disease - Continue with pantoprazole 40mg  daily.  3. 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 - Call us if you need an EpiPen.  - I think  4. Chronic rhinitis - Continue fluticasone one spray per nostril daily as needed.  - Continue with cetirizine 10mg  daily as needed.  5. Return in about 6 months (around 08/02/2023).   Please inform us of any Emergency Department visits, hospitalizations, or changes in symptoms. Call us before going to the ED for breathing or allergy symptoms since we might be able to fit you in for a sick visit. Feel free to contact us anytime with any questions, problems, or concerns.  It was a pleasure to see you again today!  Websites that have reliable patient information: 1. American Academy of Asthma, Allergy, and Immunology: www.aaaai.org 2. Food Allergy Research and Education (FARE): foodallergy.org 3. Mothers of Asthmatics: http://www.asthmacommunitynetwork.org 4. American College of Allergy, Asthma, and Immunology: www.acaai.org   COVID-19 Vaccine Information can be found at: PodExchange.[email or web address] For questions related to vaccine distribution or appointments, please email [email or web address] or call [phone].     "Like" [place] on Facebook and Instagram for our latest updates!      Make sure you are registered to vote! If you have moved or changed any of your contact information, you will need to get this updated before voting!  In some cases, you MAY be able to  register to vote online: [email or web address]

## 2023-02-14 ENCOUNTER — Other Ambulatory Visit: Payer: Self-pay

## 2023-02-14 MED ORDER — ACCU-CHEK FASTCLIX LANCETS MISC: 0 refills | Status: DC

## 2023-02-14 MED ORDER — ACCU-CHEK GUIDE VI STRP: ORAL_STRIP | 0 refills | Status: DC

## 2023-02-14 MED ORDER — LITETOUCH PEN NEEDLES 31G X 8 MM MISC: 1.0000 | Freq: Four times a day (QID) | 0 refills | Status: DC

## 2023-03-01 ENCOUNTER — Other Ambulatory Visit: Payer: Self-pay | Admitting: Internal Medicine

## 2023-03-05 ENCOUNTER — Encounter: Payer: Self-pay | Admitting: Nurse Practitioner

## 2023-03-05 ENCOUNTER — Ambulatory Visit: Payer: Medicaid Other | Admitting: Nurse Practitioner

## 2023-03-05 VITALS — BP 127/65 | HR 62 | Ht 65.0 in | Wt 231.2 lb

## 2023-03-05 DIAGNOSIS — Z794 Long term (current) use of insulin: Secondary | ICD-10-CM | POA: Diagnosis not present

## 2023-03-05 DIAGNOSIS — E782 Mixed hyperlipidemia: Secondary | ICD-10-CM

## 2023-03-05 DIAGNOSIS — E559 Vitamin D deficiency, unspecified: Secondary | ICD-10-CM | POA: Diagnosis not present

## 2023-03-05 DIAGNOSIS — E039 Hypothyroidism, unspecified: Secondary | ICD-10-CM | POA: Diagnosis not present

## 2023-03-05 DIAGNOSIS — I10 Essential (primary) hypertension: Secondary | ICD-10-CM | POA: Diagnosis not present

## 2023-03-05 DIAGNOSIS — Z7985 Long-term (current) use of injectable non-insulin antidiabetic drugs: Secondary | ICD-10-CM

## 2023-03-05 DIAGNOSIS — E1165 Type 2 diabetes mellitus with hyperglycemia: Secondary | ICD-10-CM

## 2023-03-05 DIAGNOSIS — Z7984 Long term (current) use of oral hypoglycemic drugs: Secondary | ICD-10-CM | POA: Diagnosis not present

## 2023-03-05 LAB — POCT GLYCOSYLATED HEMOGLOBIN (HGB A1C): Hemoglobin A1C: 8.5 % — AB (ref 4.0–5.6)

## 2023-03-05 MED ORDER — LANTUS SOLOSTAR 100 UNIT/ML ~~LOC~~ SOPN: 70.0000 [IU] | PEN_INJECTOR | Freq: Every day | SUBCUTANEOUS | 3 refills | Status: DC

## 2023-03-05 MED ORDER — METFORMIN HCL ER 500 MG PO TB24: 500.0000 mg | ORAL_TABLET | Freq: Two times a day (BID) | ORAL | 3 refills | Status: DC

## 2023-03-05 MED ORDER — NOVOLOG FLEXPEN 100 UNIT/ML ~~LOC~~ SOPN: 20.0000 [IU] | PEN_INJECTOR | Freq: Three times a day (TID) | SUBCUTANEOUS | 3 refills | Status: DC

## 2023-03-05 MED ORDER — OZEMPIC (1 MG/DOSE) 4 MG/3ML ~~LOC~~ SOPN
1.0000 mg | PEN_INJECTOR | SUBCUTANEOUS | 3 refills | Status: DC
Start: 2023-03-05 — End: 2023-11-27

## 2023-03-05 MED ORDER — DEXCOM G6 TRANSMITTER MISC: 3 refills | Status: DC

## 2023-03-05 MED ORDER — LEVOTHYROXINE SODIUM 75 MCG PO TABS: ORAL_TABLET | ORAL | 3 refills | Status: DC

## 2023-03-05 MED ORDER — DEXCOM G6 SENSOR MISC: 3 refills | Status: DC

## 2023-03-05 NOTE — Progress Notes (Signed)
03/05/2023   Endocrinology follow-up note   Subjective:    Patient ID: Sandy Valencia, female    DOB: 1960-12-15, PCP Anabel Halon, MD   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   Anaphylactic shock, unspecified, subsequent encounter    Anemia, iron deficiency 02/14/2015   patient is not aware this dx   Angio-edema    Arthritis    Asthma    Mild intermittent asthma   Asymptomatic varicose veins of right lower extremity    Chronic obstructive pulmonary disease with (acute) exacerbation (HCC)    patient denies this dx   Chronic rhinitis 05/21/2018   COVID-19 2019   Diabetes mellitus    type 2   Essential (primary) hypertension    Family history of colon cancer 09/03/2012   Fatty liver    Gastro-esophageal reflux disease without esophagitis    GERD (gastroesophageal reflux disease)    Hearing loss    wears hearing aid   History of colonic polyps 08/26/2017   Hypercholesteremia    Hypertension    Hypothyroidism    Low back pain    and left leg pain   Mass of finger, right s/p surgical excision (mucoid cyst) 04/17/18 04/22/2018   Mucous cyst of digit of right hand    Obesity, unspecified    Other adverse food reactions, not elsewhere classified, initial encounter    Personal history of noncompliance with medical treatment, presenting hazards to health 05/18/2015   Rash and other nonspecific skin eruption    Restless legs syndrome    patient denies this dx   Sleep apnea    pt siad, "i had small amount but could not tolerate CPAP and they said it was ok since it was mild.   Unspecified asthma, uncomplicated    Viral URI 07/13/2022   Zoster without complications    Past Surgical History:  Procedure Laterality Date   ABDOMINAL HYSTERECTOMY     BACK SURGERY  11/26/2022   BIOPSY  11/08/2022   Procedure: BIOPSY;  Surgeon: Corbin Ade, MD;  [facility]: [facility];   Service: Endoscopy;;   CATARACT EXTRACTION W/PHACO Left 03/09/2022   Procedure: CATARACT EXTRACTION PHACO AND INTRAOCULAR LENS PLACEMENT (IOC);  Surgeon: Fabio Pierce, MD;  Location: [facility];  Service: Ophthalmology;  Laterality: Left;  CDE 7.89   CATARACT EXTRACTION W/PHACO Right 03/23/2022   Procedure: CATARACT EXTRACTION PHACO AND INTRAOCULAR LENS PLACEMENT (IOC);  Surgeon: Fabio Pierce, MD;  Location: [facility];  Service: Ophthalmology;  Laterality: Right;  CDE: 2.63   CHOLECYSTECTOMY     COLONOSCOPY N/A 09/22/2012   RMR: colonic polyps -removed as described above. tubular adenoma, next TCS 09/2017   COLONOSCOPY WITH PROPOFOL N/A 09/23/2017   Surgeon: Corbin Ade, MD; internal hemorrhoids, three 5-6 mm polyps removed, diverticulosis in sigmoid and descending colon.  Pathology with tubular adenomas and rectal hyperplastic polyp.  Recommended 5 year surveillance.   COLONOSCOPY WITH PROPOFOL N/A 11/08/2022   Procedure: COLONOSCOPY WITH PROPOFOL;  Surgeon: Corbin Ade, MD;  [facility]: [facility];  Service: Endoscopy;  Laterality: N/A;  815am, asa 3   CYST EXCISION Left 07/19/2016   Procedure: CYST REMOVAL LEFT RING FINGER;  Surgeon: Vickki Hearing, MD;  Location: [facility];  Service: Orthopedics;  Laterality: Left;   CYST REMOVAL LEG Right    foot   ESOPHAGOGASTRODUODENOSCOPY N/A 10/21/2014   RMR: Small hiatal hernia;  otherwise normal EGD status post passage of a Maloney dilator.    ESOPHAGOGASTRODUODENOSCOPY (EGD) WITH PROPOFOL N/A 11/08/2022   Procedure: ESOPHAGOGASTRODUODENOSCOPY (EGD) WITH PROPOFOL;  Surgeon: Corbin Ade, MD;  [facility];  Service: Endoscopy;  Laterality: N/A;   EXCISION MASS UPPER EXTREMETIES Right 04/17/2018   Procedure: EXCISION MASS UPPER EXTREMETIES right ring finger;  Surgeon: Vickki Hearing, MD;  Location: [facility];  Service: Orthopedics;  Laterality: Right;   LUMBAR LAMINECTOMY/DECOMPRESSION MICRODISCECTOMY Bilateral 11/26/2022    Procedure: Microdiscectomy - bilateral - Lumbar four-Lumbar five;  Surgeon: Julio Sicks, MD;  Location: [facility];  Service: Neurosurgery;  Laterality: Bilateral;   MALONEY DILATION N/A 10/21/2014   Procedure: Elease Hashimoto DILATION;  Surgeon: Corbin Ade, MD;  [facility];  Service: Endoscopy;  Laterality: N/A;   MALONEY DILATION N/A 11/08/2022   Procedure: Elease Hashimoto DILATION;  Surgeon: Corbin Ade, MD;  [facility];  Service: Endoscopy;  Laterality: N/A;   MIDDLE EAR SURGERY Right    patched hole in ear drum   POLYPECTOMY  09/23/2017   Procedure: POLYPECTOMY;  Surgeon: Corbin Ade, MD;  [facility];  Service: Endoscopy;;  polyp hepatic flexure polyp cs, splenic flexure polyp cs, rectal polyp cs   POLYPECTOMY  11/08/2022   Procedure: POLYPECTOMY;  Surgeon: Corbin Ade, MD;  [facility];  Service: Endoscopy;;   TOTAL ABDOMINAL HYSTERECTOMY     Social History   Socioeconomic History   Marital status: Divorced    Spouse name: Not on file   Number of children: 2   Years of education: Not on file   Highest education level: Not on file  Occupational History   Not on file  Tobacco Use   Smoking status: Never   Smokeless tobacco: Never  Vaping Use   Vaping status: Never Used  Substance and Sexual Activity   Alcohol use: No    Alcohol/week: 0.0 standard drinks of alcohol   Drug use: No   Sexual activity: Not Currently    Birth control/protection: Surgical    Comment: Hysterectomy  Other Topics Concern   Not on file  Social History Narrative   Lives with grandson (since born)      Enjoys: vacation, travel, concerts      Diet: eats all food groups    Caffeine: diet Pepsi daily    Water:  8 cups daily       Wears seat belt    Does use phone   Smoke Retail buyer in safe area    Social Determinants of Health   Financial Resource Strain: Low Risk  (12/02/2019)   Overall Financial Resource Strain (CARDIA)    Difficulty  of Paying Living Expenses: Not hard at all  Food Insecurity: No Food Insecurity (12/02/2019)   Hunger Vital Sign    Worried About Running Out of Food in the Last Year: Never true    Ran Out of Food in the Last Year: Never true  Transportation Needs: No Transportation Needs (12/02/2019)   PRAPARE - Administrator, Civil Service (Medical): No    Lack of Transportation (Non-Medical): No  Physical Activity: Insufficiently Active (12/02/2019)   Exercise Vital Sign    Days of Exercise per Week: 2 days    Minutes of Exercise per Session: 20 min  Stress: Not on file  Social Connections: Moderately Isolated (12/02/2019)   Social Connection and Isolation Panel [NHANES]    Frequency of Communication  with Friends and Family: More than three times a week    Frequency of Social Gatherings with Friends and Family: More than three times a week    Attends Religious Services: More than 4 times per year    Active Member of [employer] or Organizations: No    Attends Banker Meetings: Never    Marital Status: Divorced   Outpatient Encounter Medications as of 03/05/2023  Medication Sig   Accu-Chek FastClix Lancets MISC TEST FOUR TIMES DAILY   albuterol (VENTOLIN HFA) 108 (90 Base) MCG/ACT inhaler INHALE 4 PUFFS INTO THE LUNGS EVERY 6 HOURS AS NEEDED FOR WHEEZING OR SHORTNESS OF BREATH   aspirin EC 81 MG tablet Take 81 mg by mouth daily.   cetirizine (ZYRTEC) 10 MG tablet Take 1 tablet (10 mg total) by mouth daily as needed for allergies. (Patient taking differently: Take 10 mg by mouth daily.)   Cholecalciferol (VITAMIN D-3) 1000 UNITS CAPS Take 1,000 Units by mouth daily.    diclofenac Sodium (VOLTAREN) 1 % GEL APPLY 2 GRAMS TO AFFECTED AREAS 2 TIMES A DAY AS NEEDED   EPINEPHrine 0.3 mg/0.3 mL IJ SOAJ injection Inject 0.3 mg into the muscle as needed for anaphylaxis.   fluticasone (FLONASE) 50 MCG/ACT nasal spray Place 1 spray into both nostrils daily.   glucose blood (ACCU-CHEK GUIDE) test  strip test FOUR TIMES DAILY   HYDROcodone-acetaminophen (NORCO) 7.5-325 MG tablet Take 1 tablet by mouth 3 (three) times daily as needed for moderate pain.   ketoconazole (NIZORAL) 2 % cream APPLY TO AFFECTED AREAS TWICE DAILY (Patient taking differently: Apply 1 Application topically daily as needed for irritation.)   lisinopril (ZESTRIL) 40 MG tablet Take 1 tablet (40 mg total) by mouth daily.   LITETOUCH PEN NEEDLES 31G X 8 MM MISC USE FOUR TIMES DAILY   pantoprazole (PROTONIX) 40 MG tablet Take 1 tablet (40 mg total) by mouth daily.   pregabalin (LYRICA) 100 MG capsule Take 100 mg by mouth 2 (two) times daily.   vitamin B-12 (CYANOCOBALAMIN) 500 MCG tablet Take 500 mcg by mouth daily.   [DISCONTINUED] Continuous Glucose Sensor (DEXCOM G6 SENSOR) MISC CHANGE SENSOR EVERY 10 DAYS AS DIRECTED.   [DISCONTINUED] Continuous Glucose Transmitter (DEXCOM G6 TRANSMITTER) MISC CHANGE TRANSMITTER EVERY 90 DAYS AS DIRECTED.   [DISCONTINUED] insulin aspart (NOVOLOG FLEXPEN) 100 UNIT/ML FlexPen INJECT 20 TO 26 UNITS INTO THE SKIN 3 TIMES DAILY WITH MEALS   [DISCONTINUED] LANTUS SOLOSTAR 100 UNIT/ML Solostar Pen Inject 60 Units into the skin at bedtime. (Patient taking differently: Inject 70 Units into the skin at bedtime.)   [DISCONTINUED] levothyroxine (SYNTHROID) 75 MCG tablet TAKE ONE (1) TABLET BY MOUTH EVERY DAY BEFORE BREAKFAST   [DISCONTINUED] metFORMIN (GLUCOPHAGE-XR) 500 MG 24 hr tablet TAKE ONE TABLET BY MOUTH TWICE A DAY AFTER A MEAL   [DISCONTINUED] OZEMPIC, 1 MG/DOSE, 4 MG/3ML SOPN INJECT 1MG  ONCE A WEEK AS DIRECTED (Patient taking differently: Takes on Wed)   Continuous Glucose Sensor (DEXCOM G6 SENSOR) MISC Change sensor every 10 days as directed   Continuous Glucose Transmitter (DEXCOM G6 TRANSMITTER) MISC Change transmitter every 90 days as directed.   cyclobenzaprine (FLEXERIL) 10 MG tablet Take 1 tablet (10 mg total) by mouth 3 (three) times daily as needed for muscle spasms. (Patient not  taking: Reported on 01/30/2023)   insulin aspart (NOVOLOG FLEXPEN) 100 UNIT/ML FlexPen Inject 20-26 Units into the skin 3 (three) times daily with meals.   LANTUS SOLOSTAR 100 UNIT/ML Solostar Pen Inject 70 Units into  the skin at bedtime.   levothyroxine (SYNTHROID) 75 MCG tablet TAKE ONE (1) TABLET BY MOUTH EVERY DAY BEFORE BREAKFAST   metFORMIN (GLUCOPHAGE-XR) 500 MG 24 hr tablet Take 1 tablet (500 mg total) by mouth 2 (two) times daily with a meal.   ondansetron (ZOFRAN) 4 MG tablet Take 1 tablet (4 mg total) by mouth every 6 (six) hours. (Patient not taking: Reported on 01/30/2023)   Semaglutide, 1 MG/DOSE, (OZEMPIC, 1 MG/DOSE,) 4 MG/3ML SOPN Inject 1 mg into the skin once a week.   No facility-administered encounter medications on file as of 03/05/2023.   ALLERGIES: Allergies  Allergen Reactions   Kiwi Extract Anaphylaxis, Swelling and Palpitations   Other Anaphylaxis    Walnuts (avoids all tree nuts)   Statins     Transaminase elevation   VACCINATION STATUS: Immunization History  Administered Date(s) Administered   Influenza,inj,Quad PF,6+ Mos 04/15/2017, 03/30/2019   Influenza-Unspecified 04/17/2011, 03/24/2012, 05/12/2013, 02/23/2015, 04/04/2017, 04/08/2018   Moderna Sars-Covid-2 Vaccination 10/22/2019, 11/19/2019   Tdap 09/18/2021   Zoster Recombinant(Shingrix) 09/14/2021, 01/16/2022    Diabetes She presents for her follow-up diabetic visit. She has type 2 diabetes mellitus. Onset time: She was diagnosed at approximate age of 40 years. Her disease course has been worsening. There are no hypoglycemic associated symptoms. Pertinent negatives for hypoglycemia include no confusion, nervousness/anxiousness, pallor or seizures. Pertinent negatives for diabetes include no fatigue, no polydipsia, no polyphagia, no polyuria and no weight loss. There are no hypoglycemic complications. Symptoms are stable. There are no diabetic complications. Risk factors for coronary artery disease include  dyslipidemia, diabetes mellitus, hypertension, sedentary lifestyle, obesity, post-menopausal and stress. Current diabetic treatment includes intensive insulin program and oral agent (monotherapy) (and Ozempic). She is compliant with treatment most of the time. Her weight is fluctuating minimally. She is following a generally healthy diet. When asked about meal planning, she reported none. She has had a previous visit with a dietitian. She never participates in exercise. Her home blood glucose trend is increasing steadily. Her overall blood glucose range is >200 mg/dl. (She presents today with her CGM showing above target glycemic profile overall.  Her POCT A1c today 8.5%, essentially unchanged from previous visit of 8.6.  Analysis of her CGM shows TIR 11%, TAR 89%, TBR 0%.  She notes she has been on/off her Ozempic for 2 weeks as she was recently in the [place] on vacation.  She has plans to restart and also be better with her diet now.) An ACE inhibitor/angiotensin II receptor blocker is being taken. She sees a podiatrist.Eye exam is current.  Hyperlipidemia This is a chronic problem. The current episode started more than 1 year ago. The problem is controlled. Recent lipid tests were reviewed and are normal. Exacerbating diseases include diabetes, hypothyroidism and obesity. There are no known factors aggravating her hyperlipidemia. Pertinent negatives include no myalgias. Current antihyperlipidemic treatment includes statins. The current treatment provides significant improvement of lipids. Compliance problems include medication cost, adherence to diet and adherence to exercise.  Risk factors for coronary artery disease include diabetes mellitus, dyslipidemia, hypertension, obesity, a sedentary lifestyle and post-menopausal.  Hypertension This is a chronic problem. The current episode started more than 1 year ago. The problem has been gradually improving since onset. The problem is controlled. Pertinent  negatives include no palpitations. Agents associated with hypertension include thyroid hormones. Risk factors for coronary artery disease include diabetes mellitus, dyslipidemia, obesity, post-menopausal state, sedentary lifestyle and stress. Past treatments include ACE inhibitors. The current treatment provides mild improvement. Compliance  problems include diet and exercise.  Identifiable causes of hypertension include sleep apnea and a thyroid problem.  Thyroid Problem Presents for follow-up visit. Patient reports no anxiety, cold intolerance, constipation, depressed mood, diarrhea, dry skin, fatigue, hair loss, heat intolerance, palpitations, weight gain or weight loss. The symptoms have been stable. Her past medical history is significant for diabetes and hyperlipidemia.     Review of systems  Constitutional: + minimally fluctuating body weight,  current Body mass index is 38.47 kg/m. , no fatigue, no subjective hyperthermia, no subjective hypothermia Eyes: no blurry vision, no xerophthalmia ENT: no sore throat, no nodules palpated in throat, no dysphagia/odynophagia, no hoarseness Cardiovascular: no chest pain, no shortness of breath, no palpitations, no leg swelling Respiratory: no cough, no shortness of breath Gastrointestinal: no nausea/vomiting/diarrhea Musculoskeletal: no muscle/joint aches Skin: no rashes, no hyperemia Neurological: no tremors, no numbness, no tingling, no dizziness Psychiatric: no depression, no anxiety   Objective:    BP 127/65 (BP Location: Left Arm, Patient Position: Sitting, Cuff Size: Large)   Pulse 62   Ht 5\' 5"  (1.651 m)   Wt 231 lb 3.2 oz (104.9 kg)   BMI 38.47 kg/m   Wt Readings from Last 3 Encounters:  03/05/23 231 lb 3.2 oz (104.9 kg)  01/30/23 227 lb 2 oz (103 kg)  01/22/23 230 lb 9.6 oz (104.6 kg)    BP Readings from Last 3 Encounters:  03/05/23 127/65  01/30/23 138/78  01/22/23 138/80     Physical Exam- Limited  Constitutional:   Body mass index is 38.47 kg/m. , not in acute distress, normal state of mind Eyes:  EOMI, no exophthalmos Musculoskeletal: no gross deformities, strength intact in all four extremities, no gross restriction of joint movements Skin:  no rashes, no hyperemia Neurological: no tremor with outstretched hands    Lipid Panel     Component Value Date/Time   CHOL 160 01/16/2022 0931   TRIG 71 01/16/2022 0931   HDL 55 01/16/2022 0931   CHOLHDL 2.9 01/16/2022 0931   CHOLHDL 2.4 12/25/2019 0709   VLDL 10 03/07/2016 0705   LDLCALC 91 01/16/2022 0931   LDLCALC 65 12/25/2019 0709    Recent Results (from the past 2160 hour(s))  HM DIABETES EYE EXAM     Status: None   Collection Time: 01/01/23 12:00 AM  Result Value Ref Range   HM Diabetic Eye Exam No Retinopathy No Retinopathy  HgB A1c     Status: Abnormal   Collection Time: 03/05/23  8:43 AM  Result Value Ref Range   Hemoglobin A1C 8.5 (A) 4.0 - 5.6 %   HbA1c POC (<> result, manual entry)     HbA1c, POC (prediabetic range)     HbA1c, POC (controlled diabetic range)         Assessment & Plan:   1) Uncontrolled type 2 diabetes mellitus with complication, with long-term current use of insulin (HCC)  She presents today with her CGM showing above target glycemic profile overall.  Her POCT A1c today 8.5%, essentially unchanged from previous visit of 8.6.  Analysis of her CGM shows TIR 11%, TAR 89%, TBR 0%.  She notes she has been on/off her Ozempic for 2 weeks as she was recently in the [place] on vacation.  She has plans to restart and also be better with her diet now.  -Her diabetes is complicated by history of noncompliance (she recently showed better engagement) and patient remains at extremely high risk for more acute and chronic complications of diabetes  which include CAD, CVA, CKD, retinopathy, and neuropathy. These are all discussed in detail with the patient.  - Nutritional counseling repeated at each appointment due to patients  tendency to fall back in to old habits.  - The patient admits there is a room for improvement in their diet and drink choices. -  Suggestion is made for the patient to avoid simple carbohydrates from their diet including Cakes, Sweet Desserts / Pastries, Ice Cream, Soda (diet and regular), Sweet Tea, Candies, Chips, Cookies, Sweet Pastries, Store Bought Juices, Alcohol in Excess of 1-2 drinks a day, Artificial Sweeteners, Coffee Creamer, and "Sugar-free" Products. This will help patient to have stable blood glucose profile and potentially avoid unintended weight gain.   - I encouraged the patient to switch to unprocessed or minimally processed complex starch and increased protein intake (animal or plant source), fruits, and vegetables.   - Patient is advised to stick to a routine mealtimes to eat 3 meals a day and avoid unnecessary snacks (to snack only to correct hypoglycemia).  - I have approached patient with the following individualized plan to manage diabetes and patient agrees.  -She will continue to require intensive treatment with  basal/bolus insulin in order for her to achieve and maintain control of diabetes to target.  - She is advised to continue her Lantus 70 units SQ nightly, Novolog 20-26 units TID with meals if glucose is above 90 and she is eating (Specific instructions on how to titrate insulin dosage based on glucose readings given to patient in writing), Metformin 500 mg ER twice daily with meals and restart her Ozempic 1 mg SQ weekly.    -She is encouraged to continue monitoring blood glucose 4 times daily-using her CGM, before meals and before bed, and to call the clinic if she has readings less than 70 or greater than 300 for 3 tests in a row.  She has benefited from CGM device and is advised to continue.  2) Lipids/HPL:  Her most recent lipid panel from 01/16/22 shows controlled LDL of 91.  She was taken off her Statin due to elevated liver enzymes- which are now improving  steadily.  Side effects and precautions discussed with her.    3) Hypertension:  Her blood pressure is controlled to target.  She is advised to continue Lisinopril 20 mg po daily (managed by her PCP).  4) Hypothyroidism:  -There are no recent TFTs to review.  She is advised to continue her current dose of Levothyroxine 75 mcg po daily before breakfast (skipping 2 days out of the week).  Will recheck TFTs prior to next visit.   - We discussed about the correct intake of her thyroid hormone, on empty stomach at fasting, with water, separated by at least 30 minutes from breakfast and other medications,  and separated by more than 4 hours from calcium, iron, multivitamins, acid reflux medications (PPIs). -Patient is made aware of the fact that thyroid hormone replacement is needed for life, dose to be adjusted by periodic monitoring of thyroid function tests.    - I advised patient to maintain close follow up with Anabel Halon, MD for primary care needs.      I spent  50  minutes in the care of the patient today including review of labs from CMP, Lipids, Thyroid Function, Hematology (current and previous including abstractions from other facilities); face-to-face time discussing  her blood glucose readings/logs, discussing hypoglycemia and hyperglycemia episodes and symptoms, medications doses, her options of short and  long term treatment based on the latest standards of care / guidelines;  discussion about incorporating lifestyle medicine;  and documenting the encounter. Risk reduction counseling performed per USPSTF guidelines to reduce obesity and cardiovascular risk factors.     Please refer to Patient Instructions for Blood Glucose Monitoring and Insulin/Medications Dosing Guide"  in media tab for additional information. Please  also refer to " Patient Self Inventory" in the Media  tab for reviewed elements of pertinent patient history.  Murray Hodgkins participated in the discussions,  expressed understanding, and voiced agreement with the above plans.  All questions were answered to her satisfaction. she is encouraged to contact clinic should she have any questions or concerns prior to her return visit.    Follow up plan: -Return in about 4 months (around 07/06/2023) for Diabetes F/U with A1c in office, Previsit labs, Bring meter and logs.  Ronny Bacon, [facility] [address] [place], [place] [address] Phone: [phone] Fax: [phone]   03/05/2023, 8:51 AM

## 2023-03-05 NOTE — Patient Instructions (Signed)

## 2023-03-12 ENCOUNTER — Other Ambulatory Visit: Payer: Self-pay | Admitting: Internal Medicine

## 2023-03-20 ENCOUNTER — Encounter (INDEPENDENT_AMBULATORY_CARE_PROVIDER_SITE_OTHER): Payer: Self-pay

## 2023-03-20 ENCOUNTER — Ambulatory Visit (INDEPENDENT_AMBULATORY_CARE_PROVIDER_SITE_OTHER): Payer: Medicaid Other | Admitting: Otolaryngology

## 2023-03-20 VITALS — Ht 65.0 in | Wt 227.0 lb

## 2023-03-20 DIAGNOSIS — H7011 Chronic mastoiditis, right ear: Secondary | ICD-10-CM | POA: Diagnosis not present

## 2023-03-24 DIAGNOSIS — H7011 Chronic mastoiditis, right ear: Secondary | ICD-10-CM | POA: Insufficient documentation

## 2023-03-24 NOTE — Progress Notes (Unsigned)
Patient ID: Sandy Valencia, female   DOB: 07/31/1960, 62 y.o.   MRN: [id number]  Follow-up: Chronic right mastoiditis  HPI: The patient is a 62 year old female who returns today for follow up evaluation of right recurrent otomastoiditis. She previously underwent right canal wall down tympanomastoidectomy surgery. She was last seen 6 months ago. At that time, she underwent debridement of her right mastoid cavity. According to the patient, she has been experiencing recurrent clogging sensation in her right ear. She denies any significant pain or change in hearing.  Exam: General: Communicates without difficulty, well nourished, no acute distress. Head: Normocephalic, no evidence injury, no tenderness, facial buttresses intact without stepoff. Face/sinus: No tenderness to palpation and percussion. Facial movement is normal and symmetric. Eyes: PERRL, EOMI. No scleral icterus, conjunctivae clear. Neuro: CN II exam reveals vision grossly intact.  No nystagmus at any point of gaze. Ears: Auricles well formed without lesions.  A right mastoid bowl is noted, with a significant amount of squamous debris. Under the operating microscope, the squamous debris is carefully removed with a combination of suction catheters, cerumen curette, and alligator forceps. After the debridement procedure, the mastoid bowl is noted to be healthy with no evidence of infection or cholesteatoma. Nose: External evaluation reveals normal support and skin without lesions.  Dorsum is intact.  Anterior rhinoscopy reveals congested mucosa over anterior aspect of inferior turbinates and intact septum.  No purulence noted. Oral:  Oral cavity and oropharynx are intact, symmetric, without erythema or edema.  Mucosa is moist without lesions. Neck: Full range of motion without pain.  There is no significant lymphadenopathy.  No masses palpable.  Thyroid bed within normal limits to palpation.  Parotid glands and submandibular glands equal bilaterally  without mass.  Trachea is midline. Neuro:  CN 2-12 grossly intact.    Assessment: 1.  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 2.  No recurrent cholesteatoma is noted.  Plan:  1.  Otomicroscopy with debridement of the right mastoid cavity.   2.  The patient is instructed to keep the right ear dry at all time.  3.  She will follow-up for reevaluation in 6 months.

## 2023-03-26 ENCOUNTER — Other Ambulatory Visit: Payer: Self-pay | Admitting: Allergy & Immunology

## 2023-04-02 DIAGNOSIS — M25549 Pain in joints of unspecified hand: Secondary | ICD-10-CM | POA: Diagnosis not present

## 2023-04-02 DIAGNOSIS — M545 Low back pain, unspecified: Secondary | ICD-10-CM | POA: Diagnosis not present

## 2023-04-02 DIAGNOSIS — G894 Chronic pain syndrome: Secondary | ICD-10-CM | POA: Diagnosis not present

## 2023-04-02 DIAGNOSIS — Z79891 Long term (current) use of opiate analgesic: Secondary | ICD-10-CM | POA: Diagnosis not present

## 2023-04-02 DIAGNOSIS — M5116 Intervertebral disc disorders with radiculopathy, lumbar region: Secondary | ICD-10-CM | POA: Diagnosis not present

## 2023-04-05 ENCOUNTER — Other Ambulatory Visit: Payer: Self-pay | Admitting: Internal Medicine

## 2023-04-10 DIAGNOSIS — L03032 Cellulitis of left toe: Secondary | ICD-10-CM | POA: Diagnosis not present

## 2023-04-10 DIAGNOSIS — L03031 Cellulitis of right toe: Secondary | ICD-10-CM | POA: Diagnosis not present

## 2023-04-10 DIAGNOSIS — L600 Ingrowing nail: Secondary | ICD-10-CM | POA: Diagnosis not present

## 2023-04-10 DIAGNOSIS — B351 Tinea unguium: Secondary | ICD-10-CM | POA: Diagnosis not present

## 2023-04-20 ENCOUNTER — Other Ambulatory Visit: Payer: Self-pay | Admitting: Nurse Practitioner

## 2023-05-14 DIAGNOSIS — G894 Chronic pain syndrome: Secondary | ICD-10-CM | POA: Diagnosis not present

## 2023-05-14 DIAGNOSIS — M545 Low back pain, unspecified: Secondary | ICD-10-CM | POA: Diagnosis not present

## 2023-05-14 DIAGNOSIS — M25549 Pain in joints of unspecified hand: Secondary | ICD-10-CM | POA: Diagnosis not present

## 2023-05-14 DIAGNOSIS — M5116 Intervertebral disc disorders with radiculopathy, lumbar region: Secondary | ICD-10-CM | POA: Diagnosis not present

## 2023-05-24 ENCOUNTER — Ambulatory Visit: Payer: Medicaid Other | Admitting: Internal Medicine

## 2023-05-24 ENCOUNTER — Encounter: Payer: Self-pay | Admitting: Internal Medicine

## 2023-05-24 ENCOUNTER — Other Ambulatory Visit: Payer: Self-pay | Admitting: Allergy & Immunology

## 2023-05-24 ENCOUNTER — Ambulatory Visit (INDEPENDENT_AMBULATORY_CARE_PROVIDER_SITE_OTHER): Payer: Medicaid Other | Admitting: Family Medicine

## 2023-05-24 ENCOUNTER — Encounter: Payer: Self-pay | Admitting: Family Medicine

## 2023-05-24 VITALS — BP 146/84 | HR 85 | Temp 97.8°F | Resp 18 | Wt 224.4 lb

## 2023-05-24 VITALS — BP 154/82 | HR 92 | Ht 65.0 in | Wt 224.0 lb

## 2023-05-24 DIAGNOSIS — J209 Acute bronchitis, unspecified: Secondary | ICD-10-CM | POA: Diagnosis not present

## 2023-05-24 DIAGNOSIS — E782 Mixed hyperlipidemia: Secondary | ICD-10-CM | POA: Diagnosis not present

## 2023-05-24 DIAGNOSIS — M5416 Radiculopathy, lumbar region: Secondary | ICD-10-CM | POA: Diagnosis not present

## 2023-05-24 DIAGNOSIS — T7800XD Anaphylactic reaction due to unspecified food, subsequent encounter: Secondary | ICD-10-CM

## 2023-05-24 DIAGNOSIS — K219 Gastro-esophageal reflux disease without esophagitis: Secondary | ICD-10-CM | POA: Diagnosis not present

## 2023-05-24 DIAGNOSIS — I10 Essential (primary) hypertension: Secondary | ICD-10-CM | POA: Diagnosis not present

## 2023-05-24 DIAGNOSIS — Z794 Long term (current) use of insulin: Secondary | ICD-10-CM | POA: Diagnosis not present

## 2023-05-24 DIAGNOSIS — E1165 Type 2 diabetes mellitus with hyperglycemia: Secondary | ICD-10-CM

## 2023-05-24 DIAGNOSIS — R052 Subacute cough: Secondary | ICD-10-CM

## 2023-05-24 DIAGNOSIS — E039 Hypothyroidism, unspecified: Secondary | ICD-10-CM

## 2023-05-24 DIAGNOSIS — J310 Chronic rhinitis: Secondary | ICD-10-CM

## 2023-05-24 DIAGNOSIS — J4520 Mild intermittent asthma, uncomplicated: Secondary | ICD-10-CM | POA: Diagnosis not present

## 2023-05-24 MED ORDER — AZITHROMYCIN 250 MG PO TABS: ORAL_TABLET | ORAL | 0 refills | Status: AC

## 2023-05-24 MED ORDER — METHYLPREDNISOLONE 4 MG PO TBPK: ORAL_TABLET | ORAL | 0 refills | Status: DC

## 2023-05-24 MED ORDER — CYCLOBENZAPRINE HCL 10 MG PO TABS
10.0000 mg | ORAL_TABLET | Freq: Three times a day (TID) | ORAL | 0 refills | Status: DC | PRN
Start: 2023-05-24 — End: 2023-06-02

## 2023-05-24 MED ORDER — OLMESARTAN MEDOXOMIL 40 MG PO TABS: 40.0000 mg | ORAL_TABLET | Freq: Every day | ORAL | 3 refills | Status: DC

## 2023-05-24 NOTE — Patient Instructions (Addendum)
Please stop taking Lisinopril and start taking Olmesartan as prescribed.  Please start taking Cyclobenzaprine as prescribed for muscle spasms.  Start taking Azithromycin and Prednisone as prescribed.  Please check blood glucose regularly and take insulin as instructed by Endocrinology.  Please continue to take other medications as prescribed.  Please continue to follow low carb diet and perform moderate exercise/walking at least 150 mins/week.  Please get fasting blood tests done before the next visit.

## 2023-05-24 NOTE — Assessment & Plan Note (Signed)
Last lipid profile reviewed Was on statin due to DM, had held statin for now due to elevated liver enzymes -plan to restart after checking CMP

## 2023-05-24 NOTE — Patient Instructions (Addendum)
Asthma Continue albuterol 2 puffs once every 4 hours if needed for cough or wheeze You may use albuterol 2 puffs 5 to 15 minutes before activity to decrease cough or wheeze  Cough Begin Tesalon Perles up to three times a day if needed for cough Begin Azithromycin and methylprednisolone as ordered by your primary care provider Call the clinic if you develop a fever or your symptoms worsen  Nonallergic rhinitis Continue cetirizine 10 mg once a day if needed for runny nose or itch Continue Flonase 2 sprays in each nostril once a day as needed for stuffy nose.  In the right nostril, point the applicator out toward the right ear. In the left nostril, point the applicator out toward the left ear Consider saline nasal rinses as needed for nasal symptoms. Use this before any medicated nasal sprays for best result  Reflux Continue dietary and lifestyle modifications as listed below Continue pantoprazole to control reflux  Food allergy Continue to avoid kiwi, walnut, and seafood.  In case of an allergic reaction, take Benadryl 50 mg every 4 hours, and if life-threatening symptoms occur, inject with EpiPen 0.3 mg.  Your blood pressure was elevated at your visit today. Continue to follow up with your primary care provider for further evaluation and treatment of your blood pressure  Call the clinic if this treatment plan is not working well for you.  Follow up in 2 months or sooner if needed.   Lifestyle Changes for Controlling GERD When you have GERD, stomach acid feels as if it's backing up toward your mouth. Whether or not you take medication to control your GERD, your symptoms can often be improved with lifestyle changes.   Raise Your Head Reflux is more likely to strike when you're lying down flat, because stomach fluid can flow backward more easily. Raising the head of your bed 4-6 inches can help. To do this: Slide blocks or books under the legs at the head of your bed. Or, place a  wedge under the mattress. Many foam stores can make a suitable wedge for you. The wedge should run from your waist to the top of your head. Don't just prop your head on several pillows. This increases pressure on your stomach. It can make GERD worse.  Watch Your Eating Habits Certain foods may increase the acid in your stomach or relax the lower esophageal sphincter, making GERD more likely. It's best to avoid the following: Coffee, tea, and carbonated drinks (with and without caffeine) Fatty, fried, or spicy food Mint, chocolate, onions, and tomatoes Any other foods that seem to irritate your stomach or cause you pain  Relieve the Pressure Eat smaller meals, even if you have to eat more often. Don't lie down right after you eat. Wait a few hours for your stomach to empty. Avoid tight belts and tight-fitting clothes. Lose excess weight.  Tobacco and Alcohol Avoid smoking tobacco and drinking alcohol. They can make GERD symptoms worse.

## 2023-05-24 NOTE — Progress Notes (Signed)
[address] [place] [address] Dept: [phone]  FOLLOW UP NOTE  Patient ID: Sandy Valencia, female    DOB: Sep 24, 1960  Age: 62 y.o. MRN: [id number] Date of Office Visit: 05/24/2023  Assessment  Chief Complaint: Cough (Was put on prednisone by her pcp but has not picked it up yet. Cough started after thanksgiving. )  HPI Sandy Valencia is a 62 year old female who presents to the clinic for follow-up visit.  She was last seen in this clinic on 01/30/2023 by Dr. Dellis Anes for evaluation of asthma, nonallergic rhinitis with negative environmental allergy testing in 2019, reflux, and food intolerance directed toward walnut, kiwi, and seafood.  At today's visit, she reports that she continues to experience cough over the last several weeks.  She reports this cough has been producing a white phlegm, however, is now dry.  She denies shortness of breath or wheeze with activity or rest.  She denies fever, sweats, chills, or sick contacts.  She reports that she infrequently uses her albuterol inhaler with no relief of symptoms.  She does report that she went to her primary care provider today where she received a prescription for azithromycin as well as a steroid taper.  We discussed the importance of beginning these medications as both of these medicines would help her cough resolve.  Rhinitis is reported as moderately well-controlled with clear rhinorrhea and nasal congestion as the main symptoms.  She continues cetirizine daily and uses Flonase as needed with relief of symptoms.  She had negative environmental allergy testing in 2019.  Reflux is reported as well-controlled with no symptoms including heartburn or vomiting.  She continues pantoprazole daily.  She continues to avoid walnuts, kiwi, and seafood with no accidental exposure or EpiPen use since her last visit to this clinic.  EpiPen is out of date and will be reordered at today's visit.  Her current medications are  listed in the chart.   Drug Allergies:  Allergies  Allergen Reactions   Kiwi Extract Anaphylaxis, Swelling and Palpitations   Other Anaphylaxis    Walnuts (avoids all tree nuts)   Statins     Transaminase elevation    Physical Exam: BP (!) 146/84   Pulse 85   Temp 97.8 F (36.6 C)   Resp 18   Wt 224 lb 6 oz (101.8 kg)   SpO2 96%   BMI 37.34 kg/m    Physical Exam Vitals reviewed.  HENT:     Head: Normocephalic and atraumatic.     Right Ear: Tympanic membrane normal.     Left Ear: Tympanic membrane normal.     Nose:     Comments: Bilateral nares slightly erythematous with thin clear nasal drainage noted.  Pharynx normal.  Ears normal.  Eyes normal.  Patient with frequent throat clearing throughout the visit    Mouth/Throat:     Pharynx: Oropharynx is clear.  Eyes:     Conjunctiva/sclera: Conjunctivae normal.  Cardiovascular:     Rate and Rhythm: Normal rate and regular rhythm.     Heart sounds: Normal heart sounds. No murmur heard. Pulmonary:     Effort: Pulmonary effort is normal.     Breath sounds: Normal breath sounds.     Comments: Lungs clear to auscultation Musculoskeletal:        General: Normal range of motion.     Cervical back: Normal range of motion and neck supple.  Skin:    General: Skin is warm and dry.  Neurological:  Mental Status: She is alert and oriented to person, place, and time.  Psychiatric:        Mood and Affect: Mood normal.        Behavior: Behavior normal.        Thought Content: Thought content normal.        Judgment: Judgment normal.     Diagnostics: Patient refused due to frequent coughing  Assessment and Plan: 1. Mild intermittent asthma without complication   2. Anaphylactic shock due to food, subsequent encounter   3. Non-allergic rhinitis   4. Gastroesophageal reflux disease, unspecified whether esophagitis present   5. Subacute cough     Meds ordered this encounter  Medications   benzonatate (TESSALON) 100 MG  capsule    Sig: Take 1 capsule (100 mg total) by mouth 3 (three) times daily as needed for cough.    Dispense:  90 capsule    Refill:  0   fluticasone (FLONASE) 50 MCG/ACT nasal spray    Sig: Place 2 sprays into both nostrils daily as needed.    Dispense:  16 g    Refill:  5   cetirizine (ZYRTEC) 10 MG tablet    Sig: Take 1 tablet (10 mg total) by mouth daily.    Dispense:  30 tablet    Refill:  5    Patient Instructions  Asthma Continue albuterol 2 puffs once every 4 hours if needed for cough or wheeze You may use albuterol 2 puffs 5 to 15 minutes before activity to decrease cough or wheeze  Cough Begin Tesalon Perles up to three times a day if needed for cough Begin Azithromycin and methylprednisolone as ordered by your primary care provider Call the clinic if you develop a fever or your symptoms worsen  Nonallergic rhinitis Continue cetirizine 10 mg once a day if needed for runny nose or itch Continue Flonase 2 sprays in each nostril once a day as needed for stuffy nose.  In the right nostril, point the applicator out toward the right ear. In the left nostril, point the applicator out toward the left ear Consider saline nasal rinses as needed for nasal symptoms. Use this before any medicated nasal sprays for best result  Reflux Continue dietary and lifestyle modifications as listed below Continue pantoprazole to control reflux  Food allergy Continue to avoid kiwi, walnut, and seafood.  In case of an allergic reaction, take Benadryl 50 mg every 4 hours, and if life-threatening symptoms occur, inject with EpiPen 0.3 mg.  Your blood pressure was elevated at your visit today. Continue to follow up with your primary care provider for further evaluation and treatment of your blood pressure  Call the clinic if this treatment plan is not working well for you.  Follow up in 2 months or sooner if needed.   Lifestyle Changes for Controlling GERD When you have GERD, stomach acid  feels as if it's backing up toward your mouth. Whether or not you take medication to control your GERD, your symptoms can often be improved with lifestyle changes.   Raise Your Head Reflux is more likely to strike when you're lying down flat, because stomach fluid can flow backward more easily. Raising the head of your bed 4-6 inches can help. To do this: Slide blocks or books under the legs at the head of your bed. Or, place a wedge under the mattress. Many foam stores can make a suitable wedge for you. The wedge should run from your waist to the top of  your head. Don't just prop your head on several pillows. This increases pressure on your stomach. It can make GERD worse.  Watch Your Eating Habits Certain foods may increase the acid in your stomach or relax the lower esophageal sphincter, making GERD more likely. It's best to avoid the following: Coffee, tea, and carbonated drinks (with and without caffeine) Fatty, fried, or spicy food Mint, chocolate, onions, and tomatoes Any other foods that seem to irritate your stomach or cause you pain  Relieve the Pressure Eat smaller meals, even if you have to eat more often. Don't lie down right after you eat. Wait a few hours for your stomach to empty. Avoid tight belts and tight-fitting clothes. Lose excess weight.  Tobacco and Alcohol Avoid smoking tobacco and drinking alcohol. They can make GERD symptoms worse.   Return in about 2 months (around 07/25/2023), or if symptoms worsen or fail to improve.    Thank you for the opportunity to care for this patient.  Please do not hesitate to contact me with questions.  Thermon Leyland, FNP [facility]

## 2023-05-24 NOTE — Assessment & Plan Note (Signed)
Lab Results  Component Value Date   HGBA1C 8.5 (A) 03/05/2023   Uncontrolled, but improving Follows up with Endocrinology On Lantus 70 units and NovoLog ISS Continue metformin and Ozempic Tried to give her statin in the past, had transaminitis -will try again to add statin later Takes Lyrica for neuropathy/low back pain

## 2023-05-24 NOTE — Assessment & Plan Note (Signed)
BP Readings from Last 1 Encounters:  05/24/23 (!) 154/82   Uncontrolled with Lisinopril 40 mg once daily, could be due to acute bronchitis and low back pain Discontinue lisinopril due to her chronic cough Switched to olmesartan 40 mg QD Counseled for compliance with the medications Advised DASH diet and moderate exercise/walking, at least 150 mins/week

## 2023-05-24 NOTE — Assessment & Plan Note (Signed)
Lab Results  Component Value Date   TSH 2.510 10/16/2022   On levothyroxine 75 mcg QD, followed by Endocrinology

## 2023-05-24 NOTE — Assessment & Plan Note (Signed)
On Lyrica and Norco PRN S/p microdiscectomy L4-5 (06/24) Followed by spine surgery and pain clinic Recent worsening of low back pain, could be due to muscle strain/stiffness from prolonged sitting in a bus -started Medrol Dosepak Flexeril as needed for muscle spasms

## 2023-05-24 NOTE — Assessment & Plan Note (Signed)
Recent worsening of cough and dyspnea Started empiric azithromycin and Medrol Dosepak Albuterol as needed for dyspnea or wheezing, needs to use it Robitussin as needed for cough

## 2023-05-24 NOTE — Progress Notes (Signed)
Established Patient Office Visit  Subjective:  Patient ID: Sandy Valencia, female    DOB: 1960/10/26  Age: 62 y.o. MRN: [id number]  CC:  Chief Complaint  Patient presents with   Back Pain    Back pain radiating down leg    Hypertension    Four month follow up     HPI Kevin W Alstrom is a 62 y.o. female with past medical history of HTN, DM, hypothyroidism, NAFLD, GERD, chronic pain syndrome and morbid obesity who presents for follow up of her chronic medical conditions.  HTN: Her BP was elevated today.  She is on lisinopril 40 mg QD. She denies any headache, dizziness, chest pain, dyspnea or palpitations.  She attributes elevated BP to her excessive coughing and back pain.  Type II DM: Followed by endocrinology.  She has been taking Ozempic.  She takes Lantus 70 units nightly and takes NovoLog ISS in addition to metformin.  Her blood glucose has been ranging around 150-200 most of the time, with morning readings above 200. She has Dexcom currently.  Acute bronchitis: She reports excessive cough, mild dyspnea and wheezing for the last 2 weeks.  She went to [place] and returned in the last week.  She has had worsening of low back pain due to excessive coughing.  Denies any fever or chills.  Lumbar radiculopathy: She had L4-5 microdiscectomy in 06/24.  She had been doing well since the surgery till recently.  She started having left-sided low back pain, radiating to the LLE, worse since the traveling to [place] in a bus.  She is currently taking Norco as needed for severe pain and Lyrica for radicular symptoms.  She has also tried taking ibuprofen with mild relief.  Insomnia: She reports chronic insomnia.  She admits that she uses phone in her bed to help her fall asleep.  Denies anhedonia, SI or HI currently.   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   Anaphylactic shock, unspecified,  subsequent encounter    Anemia, iron deficiency 02/14/2015   patient is not aware this dx   Angio-edema    Arthritis    Asthma    Mild intermittent asthma   Asymptomatic varicose veins of right lower extremity    Chronic obstructive pulmonary disease with (acute) exacerbation (HCC)    patient denies this dx   Chronic rhinitis 05/21/2018   COVID-19 2019   Diabetes mellitus    type 2   Essential (primary) hypertension    Family history of colon cancer 09/03/2012   Fatty liver    Gastro-esophageal reflux disease without esophagitis    GERD (gastroesophageal reflux disease)    Hearing loss    wears hearing aid   History of colonic polyps 08/26/2017   Hypercholesteremia    Hypertension    Hypothyroidism    Low back pain    and left leg pain   Mass of finger, right s/p surgical excision (mucoid cyst) 04/17/18 04/22/2018   Mucous cyst of digit of right hand    Obesity, unspecified    Other adverse food reactions, not elsewhere classified, initial encounter    Personal history of noncompliance with medical treatment, presenting hazards to health 05/18/2015   Rash and other nonspecific skin eruption    Restless legs syndrome    patient denies this dx   Sleep apnea    pt siad, "i had small amount  but could not tolerate CPAP and they said it was ok since it was mild.   Unspecified asthma, uncomplicated    Viral URI 07/13/2022   Zoster without complications     Past Surgical History:  Procedure Laterality Date   ABDOMINAL HYSTERECTOMY     BACK SURGERY  11/26/2022   BIOPSY  11/08/2022   Procedure: BIOPSY;  Surgeon: Corbin Ade, MD;  Location: [facility];  Service: Endoscopy;;   CATARACT EXTRACTION W/PHACO Left 03/09/2022   Procedure: CATARACT EXTRACTION PHACO AND INTRAOCULAR LENS PLACEMENT (IOC);  Surgeon: Fabio Pierce, MD;  Location: [facility];  Service: Ophthalmology;  Laterality: Left;  CDE 7.89   CATARACT EXTRACTION W/PHACO Right 03/23/2022   Procedure: CATARACT  EXTRACTION PHACO AND INTRAOCULAR LENS PLACEMENT (IOC);  Surgeon: Fabio Pierce, MD;  Location: [facility];  Service: Ophthalmology;  Laterality: Right;  CDE: 2.63   CHOLECYSTECTOMY     COLONOSCOPY N/A 09/22/2012   RMR: colonic polyps -removed as described above. tubular adenoma, next TCS 09/2017   COLONOSCOPY WITH PROPOFOL N/A 09/23/2017   Surgeon: Corbin Ade, MD; internal hemorrhoids, three 5-6 mm polyps removed, diverticulosis in sigmoid and descending colon.  Pathology with tubular adenomas and rectal hyperplastic polyp.  Recommended 5 year surveillance.   COLONOSCOPY WITH PROPOFOL N/A 11/08/2022   Procedure: COLONOSCOPY WITH PROPOFOL;  Surgeon: Corbin Ade, MD;  [facility]: [facility];  Service: Endoscopy;  Laterality: N/A;  815am, asa 3   CYST EXCISION Left 07/19/2016   Procedure: CYST REMOVAL LEFT RING FINGER;  Surgeon: Vickki Hearing, MD;  Location: [facility];  Service: Orthopedics;  Laterality: Left;   CYST REMOVAL LEG Right    foot   ESOPHAGOGASTRODUODENOSCOPY N/A 10/21/2014   RMR: Small hiatal hernia; otherwise normal EGD status post passage of a Maloney dilator.    ESOPHAGOGASTRODUODENOSCOPY (EGD) WITH PROPOFOL N/A 11/08/2022   Procedure: ESOPHAGOGASTRODUODENOSCOPY (EGD) WITH PROPOFOL;  Surgeon: Corbin Ade, MD;  [facility];  Service: Endoscopy;  Laterality: N/A;   EXCISION MASS UPPER EXTREMETIES Right 04/17/2018   Procedure: EXCISION MASS UPPER EXTREMETIES right ring finger;  Surgeon: Vickki Hearing, MD;  Location: [facility];  Service: Orthopedics;  Laterality: Right;   LUMBAR LAMINECTOMY/DECOMPRESSION MICRODISCECTOMY Bilateral 11/26/2022   Procedure: Microdiscectomy - bilateral - Lumbar four-Lumbar five;  Surgeon: Julio Sicks, MD;  Location[facility];  Service: Neurosurgery;  Laterality: Bilateral;   MALONEY DILATION N/A 10/21/2014   Procedure: Elease Hashimoto DILATION;  Surgeon: Corbin Ade, MD;  [facility];  Service: Endoscopy;  Laterality: N/A;    MALONEY DILATION N/A 11/08/2022   Procedure: Elease Hashimoto DILATION;  Surgeon: Corbin Ade, MD;  [facility]: [facility];  Service: Endoscopy;  Laterality: N/A;   MIDDLE EAR SURGERY Right    patched hole in ear drum   POLYPECTOMY  09/23/2017   Procedure: POLYPECTOMY;  Surgeon: Corbin Ade, MD;  [facility];  Service: Endoscopy;;  polyp hepatic flexure polyp cs, splenic flexure polyp cs, rectal polyp cs   POLYPECTOMY  11/08/2022   Procedure: POLYPECTOMY;  Surgeon: Corbin Ade, MD;  [facility]: [facility];  Service: Endoscopy;;   TOTAL ABDOMINAL HYSTERECTOMY      Family History  Problem Relation Age of Onset   Colon cancer Mother        diagnosed age 51, underwent surgical resection, metastatic disease several years later   Diabetes Mother    Heart attack Father    Diabetes Sister    Hypothyroidism Brother  Diabetes Sister    Diabetes Sister    Diabetes Sister    Hypothyroidism Sister    Allergic rhinitis Neg Hx    Angioedema Neg Hx    Asthma Neg Hx    Atopy Neg Hx    Eczema Neg Hx    Immunodeficiency Neg Hx    Urticaria Neg Hx     Social History   Socioeconomic History   Marital status: Divorced    Spouse name: Not on file   Number of children: 2   Years of education: Not on file   Highest education level: Not on file  Occupational History   Not on file  Tobacco Use   Smoking status: Never   Smokeless tobacco: Never  Vaping Use   Vaping status: Never Used  Substance and Sexual Activity   Alcohol use: No    Alcohol/week: 0.0 standard drinks of alcohol   Drug use: No   Sexual activity: Not Currently    Birth control/protection: Surgical    Comment: Hysterectomy  Other Topics Concern   Not on file  Social History Narrative   Lives with grandson (since born)      Enjoys: vacation, travel, concerts      Diet: eats all food groups    Caffeine: diet Pepsi daily    Water:  8 cups daily       Wears seat belt    Does use phone   Smoke  Retail buyer in safe area    Social Drivers of Health   Financial Resource Strain: Low Risk  (12/02/2019)   Overall Financial Resource Strain (CARDIA)    Difficulty of Paying Living Expenses: Not hard at all  Food Insecurity: No Food Insecurity (12/02/2019)   Hunger Vital Sign    Worried About Running Out of Food in the Last Year: Never true    Ran Out of Food in the Last Year: Never true  Transportation Needs: No Transportation Needs (12/02/2019)   PRAPARE - Administrator, Civil Service (Medical): No    Lack of Transportation (Non-Medical): No  Physical Activity: Insufficiently Active (12/02/2019)   Exercise Vital Sign    Days of Exercise per Week: 2 days    Minutes of Exercise per Session: 20 min  Stress: Not on file  Social Connections: Moderately Isolated (12/02/2019)   Social Connection and Isolation Panel [NHANES]    Frequency of Communication with Friends and Family: More than three times a week    Frequency of Social Gatherings with Friends and Family: More than three times a week    Attends Religious Services: More than 4 times per year    Active Member of [employer] or Organizations: No    Attends Banker Meetings: Never    Marital Status: Divorced  Catering manager Violence: Not At Risk (12/02/2019)   Humiliation, Afraid, Rape, and Kick questionnaire    Fear of Current or Ex-Partner: No    Emotionally Abused: No    Physically Abused: No    Sexually Abused: No    Outpatient Medications Prior to Visit  Medication Sig Dispense Refill   Accu-Chek FastClix Lancets MISC TEST FOUR TIMES DAILY 100 each 0   aspirin EC 81 MG tablet Take 81 mg by mouth daily.     cetirizine (ZYRTEC) 10 MG tablet TAKE ONE TABLET (10MG  TOTAL) BY MOUTH DAILY 30 tablet 5   Cholecalciferol (VITAMIN D-3) 1000 UNITS CAPS Take 1,000 Units by mouth daily.  Continuous Glucose Sensor (DEXCOM G6 SENSOR) MISC CHANGE SENSOR EVERY 10 DAYS AS DIRECTED. 3 each 5   Continuous  Glucose Transmitter (DEXCOM G6 TRANSMITTER) MISC Change transmitter every 90 days as directed. 1 each 3   diclofenac Sodium (VOLTAREN) 1 % GEL APPLY 2 GRAMS TO AFFECTED AREAS 2 TIMES A DAY AS NEEDED 100 g 0   EPINEPHrine 0.3 mg/0.3 mL IJ SOAJ injection Inject 0.3 mg into the muscle as needed for anaphylaxis. 2 each 1   fluticasone (FLONASE) 50 MCG/ACT nasal spray Place 1 spray into both nostrils daily. 16 g 3   glucose blood (ACCU-CHEK GUIDE) test strip test FOUR TIMES DAILY 100 strip 0   HYDROcodone-acetaminophen (NORCO) 7.5-325 MG tablet Take 1 tablet by mouth 3 (three) times daily as needed for moderate pain.     insulin aspart (NOVOLOG FLEXPEN) 100 UNIT/ML FlexPen Inject 20-26 Units into the skin 3 (three) times daily with meals. 60 mL 3   ketoconazole (NIZORAL) 2 % cream APPLY TO AFFECTED AREAS TWICE DAILY (Patient taking differently: Apply 1 Application topically daily as needed for irritation.) 60 g 2   LANTUS SOLOSTAR 100 UNIT/ML Solostar Pen Inject 70 Units into the skin at bedtime. 60 mL 3   levothyroxine (SYNTHROID) 75 MCG tablet TAKE ONE (1) TABLET BY MOUTH EVERY DAY BEFORE BREAKFAST 90 tablet 3   LITETOUCH PEN NEEDLES 31G X 8 MM MISC USE FOUR TIMES DAILY 100 each 0   metFORMIN (GLUCOPHAGE-XR) 500 MG 24 hr tablet Take 1 tablet (500 mg total) by mouth 2 (two) times daily with a meal. 180 tablet 3   ondansetron (ZOFRAN) 4 MG tablet Take 1 tablet (4 mg total) by mouth every 6 (six) hours. (Patient not taking: Reported on 01/30/2023) 12 tablet 0   pregabalin (LYRICA) 100 MG capsule Take 100 mg by mouth 2 (two) times daily.     Semaglutide, 1 MG/DOSE, (OZEMPIC, 1 MG/DOSE,) 4 MG/3ML SOPN Inject 1 mg into the skin once a week. 9 mL 3   vitamin B-12 (CYANOCOBALAMIN) 500 MCG tablet Take 500 mcg by mouth daily.     albuterol (VENTOLIN HFA) 108 (90 Base) MCG/ACT inhaler INHALE 4 PUFFS INTO THE LUNGS EVERY 6 HOURS AS NEEDED FOR WHEEZING OR SHORTNESS OF BREATH 18 g 0   cyclobenzaprine (FLEXERIL) 10 MG  tablet Take 1 tablet (10 mg total) by mouth 3 (three) times daily as needed for muscle spasms. 30 tablet 0   lisinopril (ZESTRIL) 40 MG tablet Take 1 tablet (40 mg total) by mouth daily. 90 tablet 3   pantoprazole (PROTONIX) 40 MG tablet Take 1 tablet (40 mg total) by mouth daily. 30 tablet 11   No facility-administered medications prior to visit.    Allergies  Allergen Reactions   Kiwi Extract Anaphylaxis, Swelling and Palpitations   Other Anaphylaxis    Walnuts (avoids all tree nuts)   Statins     Transaminase elevation    ROS Review of Systems  Constitutional:  Negative for chills and fever.  HENT:  Negative for congestion, sinus pressure and sinus pain.   Eyes:  Negative for pain and discharge.  Respiratory:  Positive for cough, shortness of breath and wheezing.   Cardiovascular:  Negative for chest pain and palpitations.  Gastrointestinal:  Negative for abdominal pain, diarrhea, nausea and vomiting.  Endocrine: Negative for polydipsia and polyuria.  Genitourinary:  Negative for dysuria and hematuria.  Musculoskeletal:  Positive for arthralgias and back pain. Negative for neck pain and neck stiffness.  Skin:  Positive  for color change (Mole over back).  Neurological:  Negative for dizziness and weakness.  Psychiatric/Behavioral:  Positive for sleep disturbance. Negative for agitation and behavioral problems.       Objective:    Physical Exam Vitals reviewed.  Constitutional:      General: She is not in acute distress.    Appearance: She is obese. She is not diaphoretic.  HENT:     Head: Normocephalic and atraumatic.     Nose: No congestion.     Right Sinus: Frontal sinus tenderness present.     Left Sinus: Frontal sinus tenderness present.     Mouth/Throat:     Mouth: Mucous membranes are moist.     Pharynx: No posterior oropharyngeal erythema.  Eyes:     General: No scleral icterus.    Extraocular Movements: Extraocular movements intact.  Cardiovascular:      Rate and Rhythm: Normal rate and regular rhythm.     Pulses: Normal pulses.     Heart sounds: Normal heart sounds. No murmur heard. Pulmonary:     Breath sounds: Wheezing (Mild, diffuse) present. No rales.  Musculoskeletal:     Cervical back: Neck supple. No tenderness.     Lumbar back: Tenderness present. Positive left straight leg raise test.     Right lower leg: No edema.     Left lower leg: No edema.  Skin:    General: Skin is warm.  Neurological:     General: No focal deficit present.     Mental Status: She is alert and oriented to person, place, and time.  Psychiatric:        Mood and Affect: Mood normal.        Behavior: Behavior normal.     BP (!) 154/82 (BP Location: Left Arm)   Pulse 92   Ht 5\' 5"  (1.651 m)   Wt 224 lb (101.6 kg)   SpO2 95%   BMI 37.28 kg/m  Wt Readings from Last 3 Encounters:  05/24/23 224 lb (101.6 kg)  03/20/23 227 lb (103 kg)  03/05/23 231 lb 3.2 oz (104.9 kg)    Lab Results  Component Value Date   TSH 2.510 10/16/2022   Lab Results  Component Value Date   WBC 8.0 09/17/2022   HGB 14.5 09/17/2022   HCT 44.4 09/17/2022   MCV 87.2 09/17/2022   PLT 237 09/17/2022   Lab Results  Component Value Date   NA 144 10/16/2022   K 4.2 10/16/2022   CO2 24 10/16/2022   GLUCOSE 100 (H) 10/16/2022   BUN 11 10/16/2022   CREATININE 0.78 10/16/2022   BILITOT 0.3 10/16/2022   ALKPHOS 105 10/16/2022   AST 27 10/16/2022   ALT 36 (H) 10/16/2022   PROT 7.0 10/16/2022   ALBUMIN 4.5 10/16/2022   CALCIUM 9.7 10/16/2022   ANIONGAP 10 09/17/2022   EGFR 86 10/16/2022   Lab Results  Component Value Date   CHOL 160 01/16/2022   Lab Results  Component Value Date   HDL 55 01/16/2022   Lab Results  Component Value Date   LDLCALC 91 01/16/2022   Lab Results  Component Value Date   TRIG 71 01/16/2022   Lab Results  Component Value Date   CHOLHDL 2.9 01/16/2022   Lab Results  Component Value Date   HGBA1C 8.5 (A) 03/05/2023       Assessment & Plan:   Problem List Items Addressed This Visit       Cardiovascular and Mediastinum   Essential  hypertension - Primary   BP Readings from Last 1 Encounters:  05/24/23 (!) 154/82   Uncontrolled with Lisinopril 40 mg once daily, could be due to acute bronchitis and low back pain Discontinue lisinopril due to her chronic cough Switched to olmesartan 40 mg QD Counseled for compliance with the medications Advised DASH diet and moderate exercise/walking, at least 150 mins/week      Relevant Medications   olmesartan (BENICAR) 40 MG tablet     Respiratory   Acute bronchitis   Recent worsening of cough and dyspnea Started empiric azithromycin and Medrol Dosepak Albuterol as needed for dyspnea or wheezing, needs to use it Robitussin as needed for cough       Relevant Medications   azithromycin (ZITHROMAX) 250 MG tablet   methylPREDNISolone (MEDROL DOSEPAK) 4 MG TBPK tablet     Endocrine   Type 2 diabetes mellitus with hyperglycemia (HCC)   Lab Results  Component Value Date   HGBA1C 8.5 (A) 03/05/2023   Uncontrolled, but improving Follows up with Endocrinology On Lantus 70 units and NovoLog ISS Continue metformin and Ozempic Tried to give her statin in the past, had transaminitis -will try again to add statin later Takes Lyrica for neuropathy/low back pain      Relevant Medications   olmesartan (BENICAR) 40 MG tablet   Other Relevant Orders   CMP14+EGFR   Hypothyroidism   Lab Results  Component Value Date   TSH 2.510 10/16/2022   On levothyroxine 75 mcg QD, followed by Endocrinology        Nervous and Auditory   Lumbar radiculopathy   On Lyrica and Norco PRN S/p microdiscectomy L4-5 (06/24) Followed by spine surgery and pain clinic Recent worsening of low back pain, could be due to muscle strain/stiffness from prolonged sitting in a bus -started Medrol Dosepak Flexeril as needed for muscle spasms      Relevant Medications   cyclobenzaprine  (FLEXERIL) 10 MG tablet     Other   Mixed hyperlipidemia   Last lipid profile reviewed Was on statin due to DM, had held statin for now due to elevated liver enzymes -plan to restart after checking CMP      Relevant Medications   olmesartan (BENICAR) 40 MG tablet   Other Relevant Orders   Lipid Profile   Meds ordered this encounter  Medications   azithromycin (ZITHROMAX) 250 MG tablet    Sig: Take 2 tablets on day 1, then 1 tablet daily on days 2 through 5    Dispense:  6 tablet    Refill:  0   methylPREDNISolone (MEDROL DOSEPAK) 4 MG TBPK tablet    Sig: Take as package instructions.    Dispense:  1 each    Refill:  0   cyclobenzaprine (FLEXERIL) 10 MG tablet    Sig: Take 1 tablet (10 mg total) by mouth 3 (three) times daily as needed for muscle spasms.    Dispense:  30 tablet    Refill:  0   olmesartan (BENICAR) 40 MG tablet    Sig: Take 1 tablet (40 mg total) by mouth daily.    Dispense:  30 tablet    Refill:  3    Please discontinue Lisinopril.    Follow-up: Return in about 3 weeks (around 06/14/2023).    Anabel Halon, MD

## 2023-05-26 ENCOUNTER — Encounter: Payer: Self-pay | Admitting: Family Medicine

## 2023-05-26 MED ORDER — FLUTICASONE PROPIONATE 50 MCG/ACT NA SUSP: 2.0000 | Freq: Every day | NASAL | 5 refills | Status: DC | PRN

## 2023-05-26 MED ORDER — CETIRIZINE HCL 10 MG PO TABS: 10.0000 mg | ORAL_TABLET | Freq: Every day | ORAL | 5 refills | Status: DC

## 2023-05-26 MED ORDER — BENZONATATE 100 MG PO CAPS: 100.0000 mg | ORAL_CAPSULE | Freq: Three times a day (TID) | ORAL | 0 refills | Status: DC | PRN

## 2023-06-01 ENCOUNTER — Emergency Department (HOSPITAL_COMMUNITY)
Admission: EM | Admit: 2023-06-01 | Discharge: 2023-06-02 | Disposition: A | Discharge status: Home / Self Care | Payer: Medicaid Other | Attending: Emergency Medicine | Admitting: Emergency Medicine

## 2023-06-01 ENCOUNTER — Encounter (HOSPITAL_COMMUNITY): Payer: Self-pay

## 2023-06-01 ENCOUNTER — Other Ambulatory Visit: Payer: Self-pay

## 2023-06-01 DIAGNOSIS — Z7982 Long term (current) use of aspirin: Secondary | ICD-10-CM | POA: Insufficient documentation

## 2023-06-01 DIAGNOSIS — E119 Type 2 diabetes mellitus without complications: Secondary | ICD-10-CM | POA: Diagnosis not present

## 2023-06-01 DIAGNOSIS — I10 Essential (primary) hypertension: Secondary | ICD-10-CM | POA: Insufficient documentation

## 2023-06-01 DIAGNOSIS — Z794 Long term (current) use of insulin: Secondary | ICD-10-CM | POA: Insufficient documentation

## 2023-06-01 DIAGNOSIS — K5730 Diverticulosis of large intestine without perforation or abscess without bleeding: Secondary | ICD-10-CM | POA: Diagnosis not present

## 2023-06-01 DIAGNOSIS — R319 Hematuria, unspecified: Secondary | ICD-10-CM | POA: Insufficient documentation

## 2023-06-01 DIAGNOSIS — Z79899 Other long term (current) drug therapy: Secondary | ICD-10-CM | POA: Insufficient documentation

## 2023-06-01 DIAGNOSIS — R160 Hepatomegaly, not elsewhere classified: Secondary | ICD-10-CM | POA: Diagnosis not present

## 2023-06-01 DIAGNOSIS — Z7984 Long term (current) use of oral hypoglycemic drugs: Secondary | ICD-10-CM | POA: Diagnosis not present

## 2023-06-01 DIAGNOSIS — R350 Frequency of micturition: Secondary | ICD-10-CM | POA: Diagnosis present

## 2023-06-01 DIAGNOSIS — D72829 Elevated white blood cell count, unspecified: Secondary | ICD-10-CM | POA: Diagnosis not present

## 2023-06-01 DIAGNOSIS — K429 Umbilical hernia without obstruction or gangrene: Secondary | ICD-10-CM | POA: Diagnosis not present

## 2023-06-01 DIAGNOSIS — N390 Urinary tract infection, site not specified: Secondary | ICD-10-CM | POA: Diagnosis not present

## 2023-06-01 DIAGNOSIS — R109 Unspecified abdominal pain: Secondary | ICD-10-CM | POA: Diagnosis not present

## 2023-06-01 DIAGNOSIS — R748 Abnormal levels of other serum enzymes: Secondary | ICD-10-CM | POA: Insufficient documentation

## 2023-06-01 LAB — CBC WITH DIFFERENTIAL/PLATELET
Abs Immature Granulocytes: 0.05 10*3/uL (ref 0.00–0.07)
Basophils Absolute: 0.1 10*3/uL (ref 0.0–0.1)
Basophils Relative: 1 %
Eosinophils Absolute: 0.1 10*3/uL (ref 0.0–0.5)
Eosinophils Relative: 1 %
HCT: 41.1 % (ref 36.0–46.0)
Hemoglobin: 12.9 g/dL (ref 12.0–15.0)
Immature Granulocytes: 1 %
Lymphocytes Relative: 19 %
Lymphs Abs: 2.1 10*3/uL (ref 0.7–4.0)
MCH: 28.1 pg (ref 26.0–34.0)
MCHC: 31.4 g/dL (ref 30.0–36.0)
MCV: 89.5 fL (ref 80.0–100.0)
Monocytes Absolute: 0.6 10*3/uL (ref 0.1–1.0)
Monocytes Relative: 6 %
Neutro Abs: 8.1 10*3/uL — ABNORMAL HIGH (ref 1.7–7.7)
Neutrophils Relative %: 72 %
Platelets: 282 10*3/uL (ref 150–400)
RBC: 4.59 MIL/uL (ref 3.87–5.11)
RDW: 14.3 % (ref 11.5–15.5)
WBC: 11 10*3/uL — ABNORMAL HIGH (ref 4.0–10.5)
nRBC: 0 % (ref 0.0–0.2)

## 2023-06-01 NOTE — ED Triage Notes (Signed)
Pt arrived via POV from home c/o abdominal pain and hematuria. Pt endorses urinary frequency earlier today. Pt denies dysuria but does report seeing blood in her urine.

## 2023-06-02 ENCOUNTER — Emergency Department (HOSPITAL_COMMUNITY): Payer: Medicaid Other

## 2023-06-02 DIAGNOSIS — R160 Hepatomegaly, not elsewhere classified: Secondary | ICD-10-CM | POA: Diagnosis not present

## 2023-06-02 DIAGNOSIS — R109 Unspecified abdominal pain: Secondary | ICD-10-CM | POA: Diagnosis not present

## 2023-06-02 DIAGNOSIS — K429 Umbilical hernia without obstruction or gangrene: Secondary | ICD-10-CM | POA: Diagnosis not present

## 2023-06-02 DIAGNOSIS — K5730 Diverticulosis of large intestine without perforation or abscess without bleeding: Secondary | ICD-10-CM | POA: Diagnosis not present

## 2023-06-02 LAB — URINALYSIS, ROUTINE W REFLEX MICROSCOPIC
Bilirubin Urine: NEGATIVE
Glucose, UA: >=500 mg/dL — AB
Ketones, ur: NEGATIVE mg/dL
Nitrite: NEGATIVE
Protein, ur: 100 mg/dL — AB
Specific Gravity, Urine: 1.014 (ref 1.005–1.030)
pH: 6 (ref 5.0–8.0)

## 2023-06-02 LAB — URINALYSIS, W/ REFLEX TO CULTURE (INFECTION SUSPECTED)
Bilirubin Urine: NEGATIVE
Glucose, UA: >=500 mg/dL — AB
Ketones, ur: NEGATIVE mg/dL
Nitrite: POSITIVE — AB
Protein, ur: 30 mg/dL — AB
RBC / HPF: >50 RBC/hpf (ref 0–5)
Specific Gravity, Urine: 1.017 (ref 1.005–1.030)
WBC, UA: >50 WBC/hpf (ref 0–5)
pH: 5 (ref 5.0–8.0)

## 2023-06-02 LAB — COMPREHENSIVE METABOLIC PANEL
ALT: 36 U/L (ref 0–44)
AST: 20 U/L (ref 15–41)
Albumin: 4 g/dL (ref 3.5–5.0)
Alkaline Phosphatase: 136 U/L — ABNORMAL HIGH (ref 38–126)
Anion gap: 11 (ref 5–15)
BUN: 21 mg/dL (ref 8–23)
CO2: 21 mmol/L — ABNORMAL LOW (ref 22–32)
Calcium: 9.2 mg/dL (ref 8.9–10.3)
Chloride: 103 mmol/L (ref 98–111)
Creatinine, Ser: 0.93 mg/dL (ref 0.44–1.00)
GFR, Estimated: >60 mL/min (ref >60)
Glucose, Bld: 304 mg/dL — ABNORMAL HIGH (ref 70–99)
Potassium: 3.8 mmol/L (ref 3.5–5.1)
Sodium: 135 mmol/L (ref 135–145)
Total Bilirubin: 0.5 mg/dL (ref <1.2)
Total Protein: 7.1 g/dL (ref 6.5–8.1)

## 2023-06-02 MED ORDER — CEFDINIR 300 MG PO CAPS
300.0000 mg | ORAL_CAPSULE | Freq: Once | ORAL | Status: AC
  Administered 2023-06-02: 300 mg via ORAL
  Filled 2023-06-02: qty 1

## 2023-06-02 MED ORDER — CEFDINIR 300 MG PO CAPS: 300.0000 mg | ORAL_CAPSULE | Freq: Two times a day (BID) | ORAL | 0 refills | Status: DC

## 2023-06-02 MED ORDER — CEFDINIR 300 MG PO CAPS
300.0000 mg | ORAL_CAPSULE | Freq: Two times a day (BID) | ORAL | Status: DC
Start: 2023-06-02 — End: 2023-06-02

## 2023-06-02 NOTE — ED Provider Notes (Signed)
[place] EMERGENCY DEPARTMENT AT [facility] Provider Note   CSN: [id number] Arrival date & time: 06/01/23  2242     History  Chief Complaint  Patient presents with   Abdominal Pain    Sandy Valencia is a 62 y.o. female.  The history is provided by the patient.  Abdominal Pain She has history of hypertension, diabetes, hyperlipidemia, GERD and comes in complaining of urinary urgency and frequency and tenesmus today.  On several occasions, she saw some blood.  She denies abdominal pain or flank pain.  She denies fever or chills.  She denies nausea or vomiting.   Home Medications Prior to Admission medications   Medication Sig Start Date End Date Taking? Authorizing Provider  Accu-Chek FastClix Lancets MISC TEST FOUR TIMES DAILY 03/01/23   Anabel Halon, MD  aspirin EC 81 MG tablet Take 81 mg by mouth daily.    [provider]  benzonatate (TESSALON) 100 MG capsule Take 1 capsule (100 mg total) by mouth 3 (three) times daily as needed for cough. 05/26/23   Hetty Blend, FNP  cetirizine (ZYRTEC) 10 MG tablet Take 1 tablet (10 mg total) by mouth daily. 05/26/23   Hetty Blend, FNP  Cholecalciferol (VITAMIN D-3) 1000 UNITS CAPS Take 1,000 Units by mouth daily.     [provider]  Continuous Glucose Sensor (DEXCOM G6 SENSOR) MISC CHANGE SENSOR EVERY 10 DAYS AS DIRECTED. 04/22/23   Dani Gobble, NP  Continuous Glucose Transmitter (DEXCOM G6 TRANSMITTER) MISC Change transmitter every 90 days as directed. 03/05/23   Dani Gobble, NP  cyclobenzaprine (FLEXERIL) 10 MG tablet Take 1 tablet (10 mg total) by mouth 3 (three) times daily as needed for muscle spasms. Patient not taking: Reported on 05/24/2023 05/24/23   Anabel Halon, MD  diclofenac Sodium (VOLTAREN) 1 % GEL APPLY 2 GRAMS TO AFFECTED AREAS 2 TIMES A DAY AS NEEDED 04/05/23   Anabel Halon, MD  EPINEPHrine 0.3 mg/0.3 mL IJ SOAJ injection Inject 0.3 mg into the muscle as needed for  anaphylaxis. 04/25/22   Alfonse Spruce, MD  fluticasone [facility]) 50 MCG/ACT nasal spray Place 2 sprays into both nostrils daily as needed. 05/26/23   Hetty Blend, FNP  glucose blood (ACCU-CHEK GUIDE) test strip test FOUR TIMES DAILY 03/01/23   Anabel Halon, MD  HYDROcodone-acetaminophen (NORCO) 7.5-325 MG tablet Take 1 tablet by mouth 3 (three) times daily as needed for moderate pain. 06/28/20   [provider]  insulin aspart (NOVOLOG FLEXPEN) 100 UNIT/ML FlexPen Inject 20-26 Units into the skin 3 (three) times daily with meals. 03/05/23   Dani Gobble, NP  ketoconazole (NIZORAL) 2 % cream APPLY TO AFFECTED AREAS TWICE DAILY Patient taking differently: Apply 1 Application topically daily as needed for irritation. 09/21/22   Anabel Halon, MD  LANTUS SOLOSTAR 100 UNIT/ML Solostar Pen Inject 70 Units into the skin at bedtime. 03/05/23   Dani Gobble, NP  levothyroxine (SYNTHROID) 75 MCG tablet TAKE ONE (1) TABLET BY MOUTH EVERY DAY BEFORE BREAKFAST 03/05/23   Dani Gobble, NP  LITETOUCH PEN NEEDLES 31G X 8 MM MISC USE FOUR TIMES DAILY 03/01/23   Anabel Halon, MD  metFORMIN (GLUCOPHAGE-XR) 500 MG 24 hr tablet Take 1 tablet (500 mg total) by mouth 2 (two) times daily with a meal. 03/05/23   Reardon, Freddi Starr, NP  methylPREDNISolone (MEDROL DOSEPAK) 4 MG TBPK tablet Take as package instructions. Patient not taking: Reported on  05/24/2023 05/24/23   Anabel Halon, MD  olmesartan (BENICAR) 40 MG tablet Take 1 tablet (40 mg total) by mouth daily. 05/24/23   Anabel Halon, MD  ondansetron (ZOFRAN) 4 MG tablet Take 1 tablet (4 mg total) by mouth every 6 (six) hours. Patient not taking: Reported on 01/30/2023 09/17/22   Evlyn Kanner T, PA-C  pantoprazole (PROTONIX) 40 MG tablet TAKE ONE TABLET (40MG  TOTAL) BY MOUTH DAILY 05/24/23   Alfonse Spruce, MD  pregabalin (LYRICA) 100 MG capsule Take 100 mg by mouth 2 (two) times daily. 04/20/22   [provider]   Semaglutide, 1 MG/DOSE, (OZEMPIC, 1 MG/DOSE,) 4 MG/3ML SOPN Inject 1 mg into the skin once a week. 03/05/23   Dani Gobble, NP  tiZANidine (ZANAFLEX) 2 MG tablet Take 2 mg by mouth 2 (two) times daily. 04/02/23   [provider]  VENTOLIN HFA 108 (90 Base) MCG/ACT inhaler INHALE 4 PUFFS INTO THE LUNGS EVERY 6 HOURS AS NEEDED FOR WHEEZING OR SHORTNESS OF BREATH 05/24/23   Alfonse Spruce, MD  vitamin B-12 (CYANOCOBALAMIN) 500 MCG tablet Take 500 mcg by mouth daily.    [provider]      Allergies    Kiwi extract, Other, and Statins    Review of Systems   Review of Systems  Gastrointestinal:  Positive for abdominal pain.  All other systems reviewed and are negative.   Physical Exam Updated Vital Signs BP (!) 161/110 (BP Location: Right Arm)   Pulse (!) 111   Temp 98.2 F (36.8 C) (Oral)   Resp 20   Ht 5\' 5"  (1.651 m)   Wt 102 kg   SpO2 100%   BMI 37.42 kg/m  Physical Exam Vitals and nursing note reviewed.   62 year old female, resting comfortably and in no acute distress. Vital signs are significant for slightly elevated heart rate and moderately elevated blood pressure. Oxygen saturation is 100%, which is normal. Head is normocephalic and atraumatic. PERRLA, EOMI. Oropharynx is clear. Neck is nontender and supple without adenopathy. Back is nontender and there is no CVA tenderness. Lungs are clear without rales, wheezes, or rhonchi. Chest is nontender. Heart has regular rate and rhythm without murmur. Abdomen is soft, flat, nontender. Extremities have no cyanosis or edema, full range of motion is present. Skin is warm and dry without rash. Neurologic: Mental status is normal, moves all extremities equally.  ED Results / Procedures / Treatments   Labs (all labs ordered are listed, but only abnormal results are displayed) Labs Reviewed  URINALYSIS, ROUTINE W REFLEX MICROSCOPIC - Abnormal; Notable for the following components:      Result  Value   Color, Urine AMBER (*)    APPearance CLOUDY (*)    Glucose, UA >=500 (*)    Hgb urine dipstick MODERATE (*)    Protein, ur 100 (*)    Leukocytes,Ua MODERATE (*)    All other components within normal limits  CBC WITH DIFFERENTIAL/PLATELET - Abnormal; Notable for the following components:   WBC 11.0 (*)    Neutro Abs 8.1 (*)    All other components within normal limits  COMPREHENSIVE METABOLIC PANEL - Abnormal; Notable for the following components:   CO2 21 (*)    Glucose, Bld 304 (*)    Alkaline Phosphatase 136 (*)    All other components within normal limits  URINALYSIS, W/ REFLEX TO CULTURE (INFECTION SUSPECTED) - Abnormal; Notable for the following components:   APPearance HAZY (*)  Glucose, UA >=500 (*)    Hgb urine dipstick LARGE (*)    Protein, ur 30 (*)    Nitrite POSITIVE (*)    Leukocytes,Ua LARGE (*)    Bacteria, UA RARE (*)    All other components within normal limits  URINE CULTURE   Radiology CT Renal Stone Study Result Date: 06/02/2023 CLINICAL DATA:  Abdominal pain, hematuria and urinary frequency. Flank pain. EXAM: CT ABDOMEN AND PELVIS WITHOUT CONTRAST TECHNIQUE: Multidetector CT imaging of the abdomen and pelvis was performed following the standard protocol without IV contrast. RADIATION DOSE REDUCTION: This exam was performed according to the departmental dose-optimization program which includes automated exposure control, adjustment of the mA and/or kV according to patient size and/or use of iterative reconstruction technique. COMPARISON:  CT with IV contrast 04/22/2018. FINDINGS: Lower chest: No abnormality. Hepatobiliary: Liver is 20 cm length and mildly steatotic. No mass is seen without contrast. Again noted status post cholecystectomy with stable common bile duct prominence measuring 12 mm. Pancreas: Unremarkable without contrast. Spleen: Unremarkable without contrast.  No splenomegaly. Adrenals/Urinary Tract: There is no adrenal mass. There is no  contour deforming abnormality of either of the unenhanced kidneys. There is a solitary 2 mm caliceal stone in the inferior pole right renal collecting system. No other intrarenal stones are seen. There are no ureteral stones or hydronephrosis bilaterally. The bladder is contracted but there do appear to be perivesical edematous changes. Findings suspicious for cystitis.  Correlate with urinalysis. Stomach/Bowel: No dilatation or wall thickening. Normal subcecal appendix. Uncomplicated sigmoid diverticulosis. Vascular/Lymphatic: Aortic atherosclerosis. No enlarged abdominal or pelvic lymph nodes. Reproductive: Status post hysterectomy. No adnexal masses. Other: Small umbilical fat hernia. No incarcerated hernia. No free fluid, free hemorrhage or free air. Scattered pelvic phleboliths. Musculoskeletal: Asymmetric mild left hip DJD. Mild lumbar dextroscoliosis. Degenerative change lumbar spine. IMPRESSION: 1. 2 mm caliceal stone inferior pole right renal collecting system. No ureteral stones or hydronephrosis. 2. Contracted bladder with perivesical edematous changes suspicious for cystitis. Correlate with urinalysis. 3. Aortic atherosclerosis. 4. Mild hepatomegaly and mild hepatic steatosis. 5. Uncomplicated sigmoid diverticulosis. 6. Small umbilical fat hernia. 7. Asymmetric left hip DJD. Aortic Atherosclerosis (ICD10-I70.0). Electronically Signed   By: Almira Bar M.D.   On: 06/02/2023 05:22    Procedures Procedures    Medications Ordered in ED Medications  cefdinir (OMNICEF) capsule 300 mg (has no administration in time range)    ED Course/ Medical Decision Making/ A&P                                 Medical Decision Making Amount and/or Complexity of Data Reviewed Labs: ordered. Radiology: ordered.  Risk Prescription drug management.   Urinary frequency and urgency with hematuria.  Consider UTI, consider urolithiasis.  I reviewed her laboratory test, my interpretation is mild  leukocytosis which is nonspecific, mildly elevated alkaline phosphatase which is new, urinalysis significant for moderate hemoglobin and moderate leukocyte esterase.  However, microscopic exam was not done, I have ordered a new urinalysis, renal stone protocol CT scan.  Urinalysis microscopic is significant for greater than 50 RBCs and greater than 50 WBCs.  Repeat urinalysis is also nitrite positive whereas initial urinalysis was nitrite negative.  This is indicative of urinary tract infection.  CT scan shows 2 mm calyceal stone in the inferior pole of the right renal collecting system but no other renal calculi and no ureteral calculi.  Contracted bladder with edematous changes  suspicious for cystitis and consistent with her urinalysis.  I have ordered a dose of cefdinir and I am discharging her with a prescription for cefdinir.  Return precautions discussed.  Final Clinical Impression(s) / ED Diagnoses Final diagnoses:  Urinary tract infection with hematuria, site unspecified    Rx / DC Orders ED Discharge Orders          Ordered    cefdinir (OMNICEF) 300 MG capsule  2 times daily        06/02/23 0538              Dione Booze, MD 06/02/23 [phone]

## 2023-06-02 NOTE — Discharge Instructions (Signed)
Drink plenty of fluids.  Return to the emergency department if you start running a fever or start vomiting.

## 2023-06-02 NOTE — ED Notes (Signed)
Patient transported to CT 

## 2023-06-04 LAB — URINE CULTURE: Culture: >=100000 — AB

## 2023-06-06 DIAGNOSIS — M5416 Radiculopathy, lumbar region: Secondary | ICD-10-CM | POA: Diagnosis not present

## 2023-06-18 DIAGNOSIS — M5116 Intervertebral disc disorders with radiculopathy, lumbar region: Secondary | ICD-10-CM | POA: Diagnosis not present

## 2023-06-18 DIAGNOSIS — M25549 Pain in joints of unspecified hand: Secondary | ICD-10-CM | POA: Diagnosis not present

## 2023-06-18 DIAGNOSIS — M545 Low back pain, unspecified: Secondary | ICD-10-CM | POA: Diagnosis not present

## 2023-06-18 DIAGNOSIS — G894 Chronic pain syndrome: Secondary | ICD-10-CM | POA: Diagnosis not present

## 2023-06-18 DIAGNOSIS — Z79891 Long term (current) use of opiate analgesic: Secondary | ICD-10-CM | POA: Diagnosis not present

## 2023-06-19 ENCOUNTER — Ambulatory Visit: Payer: Medicaid Other | Admitting: Internal Medicine

## 2023-06-19 ENCOUNTER — Encounter: Payer: Self-pay | Admitting: Internal Medicine

## 2023-06-19 VITALS — BP 115/87 | HR 113 | Ht 65.0 in | Wt 226.6 lb

## 2023-06-19 DIAGNOSIS — E782 Mixed hyperlipidemia: Secondary | ICD-10-CM | POA: Diagnosis not present

## 2023-06-19 DIAGNOSIS — N3000 Acute cystitis without hematuria: Secondary | ICD-10-CM | POA: Diagnosis not present

## 2023-06-19 DIAGNOSIS — Z794 Long term (current) use of insulin: Secondary | ICD-10-CM | POA: Diagnosis not present

## 2023-06-19 DIAGNOSIS — I10 Essential (primary) hypertension: Secondary | ICD-10-CM

## 2023-06-19 DIAGNOSIS — E1165 Type 2 diabetes mellitus with hyperglycemia: Secondary | ICD-10-CM

## 2023-06-19 DIAGNOSIS — M5416 Radiculopathy, lumbar region: Secondary | ICD-10-CM

## 2023-06-19 DIAGNOSIS — Z09 Encounter for follow-up examination after completed treatment for conditions other than malignant neoplasm: Secondary | ICD-10-CM

## 2023-06-19 MED ORDER — SIMVASTATIN 10 MG PO TABS: 10.0000 mg | ORAL_TABLET | Freq: Every day | ORAL | 1 refills | Status: DC

## 2023-06-19 NOTE — Assessment & Plan Note (Addendum)
 BMI Readings from Last 3 Encounters:  06/19/23 37.71 kg/m  06/01/23 37.42 kg/m  05/24/23 37.34 kg/m   Associated with HTN, DM, HLD and lumbar spondylosis Diet modification and moderate exercise advised On Ozempic  for type II DM

## 2023-06-19 NOTE — Assessment & Plan Note (Signed)
 ER chart reviewed, including blood tests Was given Cefdinir  for UTI, symptoms improved now Advised to maintain adequate hydration

## 2023-06-19 NOTE — Assessment & Plan Note (Signed)
 Lab Results  Component Value Date   HGBA1C 8.5 (A) 03/05/2023   Uncontrolled, but improving Follows up with Endocrinology On Lantus 70 units and NovoLog ISS Continue metformin and Ozempic Tried to give her statin in the past, had transaminitis -will try again to add statin later Takes Lyrica for neuropathy/low back pain

## 2023-06-19 NOTE — Assessment & Plan Note (Signed)
 BP Readings from Last 1 Encounters:  06/19/23 115/87   Well-controlled with olmesartan  40 mg QD Discontinued lisinopril  due to her chronic cough in the last visit Counseled for compliance with the medications Advised DASH diet and moderate exercise/walking, at least 150 mins/week

## 2023-06-19 NOTE — Assessment & Plan Note (Signed)
 Last lipid profile reviewed Was on statin due to DM, had held statin for now due to elevated liver enzymes - restart simvastatin  as her liver enzymes have normalized now

## 2023-06-19 NOTE — Assessment & Plan Note (Signed)
 On Lyrica  and Norco PRN S/p microdiscectomy L4-5 (06/24) Followed by [facility] surgery [facility] pain clinic Recent worsening of low back pain, could be due to muscle strain/stiffness from prolonged sitting in a bus -improved with Medrol  Dosepak Flexeril  as needed for muscle spasms

## 2023-06-19 NOTE — Patient Instructions (Signed)
 Please start taking Simvastatin  10 mg as prescribed.  Please continue to take medications as prescribed.  Please continue to follow low carb diet and perform moderate exercise/walking at least 150 mins/week.

## 2023-06-19 NOTE — Progress Notes (Signed)
 Established Patient Office Visit  Subjective:  Patient ID: Sandy Valencia, female    DOB: 28-Oct-1960  Age: 63 y.o. MRN: [id number]  CC:  Chief Complaint  Patient presents with   Care Management    3 week f/u , pt reports feeling better, would like to recheck her urine to make sure all is better.     HPI Sandy Valencia is a 63 y.o. female with past medical history of HTN, DM, hypothyroidism, NAFLD, GERD, chronic pain syndrome and morbid obesity who presents for follow up of her chronic medical conditions.  HTN: Her BP was wnl today.  She is on olmesartan  40 mg QD. She denies any headache, dizziness, chest pain, dyspnea or palpitations.  Type II DM: Followed by endocrinology.  She has been taking Ozempic .  She takes Lantus  70 units nightly and takes NovoLog  ISS in addition to metformin .  Her blood glucose has been ranging around 150-200 most of the time, with morning readings above 200. She has Dexcom currently.  Acute bronchitis: Resolved now. Cough and dyspnea has improved.  Lumbar radiculopathy: She had L4-5 microdiscectomy in 06/24.  She had been doing well since the surgery till recently.  She started having left-sided low back pain, radiating to the LLE, worse since the traveling to [place]  in a bus.  She is currently taking Norco as needed for severe pain and Lyrica  for radicular symptoms.  Her acute back pain has improved with Medrol  Dosepak and Flexeril .  Insomnia: She reports chronic insomnia.  She admits that she uses phone in her bed to help her fall asleep.  Denies anhedonia, SI or HI currently.  She went to ER on 06/01/23 for UTI, was given cefdinir .  She had CT renal done to rule out nephrolithiasis, which showed acute cystitis and nonobstructing 2 mm caliceal stone.  She has noticed noticed improvement in urinary frequency with cefdinir .  Denies any fever, chills, nausea, vomiting or dysuria currently.  Past Medical History:  Diagnosis Date   Allergic rhinitis due to  pollen    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   Anaphylactic shock, unspecified, subsequent encounter    Anemia, iron deficiency 02/14/2015   patient is not aware this dx   Angio-edema    Arthritis    Asthma    Mild intermittent asthma   Asymptomatic varicose veins of right lower extremity    Chronic obstructive pulmonary disease with (acute) exacerbation (HCC)    patient denies this dx   Chronic rhinitis 05/21/2018   COVID-19 2019   Diabetes mellitus    type 2   Essential (primary) hypertension    Family history of colon cancer 09/03/2012   Fatty liver    Gastro-esophageal reflux disease without esophagitis    GERD (gastroesophageal reflux disease)    Hearing loss    wears hearing aid   History of colonic polyps 08/26/2017   Hypercholesteremia    Hypertension    Hypothyroidism    Low back pain    and left leg pain   Mass of finger, right s/p surgical excision (mucoid cyst) 04/17/18 04/22/2018   Mucous cyst of digit of right hand    Obesity, unspecified    Other adverse food reactions, not elsewhere classified, initial encounter    Personal history of noncompliance with medical treatment, presenting hazards to health 05/18/2015   Rash and other nonspecific skin eruption    Restless legs syndrome    patient denies this dx   Sleep apnea  pt siad, "i had small amount but could not tolerate CPAP and they said it was ok since it was mild.   Unspecified asthma, uncomplicated    Viral URI 07/13/2022   Zoster without complications     Past Surgical History:  Procedure Laterality Date   ABDOMINAL HYSTERECTOMY     BACK SURGERY  11/26/2022   BIOPSY  11/08/2022   Procedure: BIOPSY;  Surgeon: Suzette Espy, MD;  Location: [facility];  Service: Endoscopy;;   CATARACT EXTRACTION W/PHACO Left 03/09/2022   Procedure: CATARACT EXTRACTION PHACO AND INTRAOCULAR LENS PLACEMENT (IOC);  Surgeon: Tarri Farm, MD;  Location: [facility];  Service: Ophthalmology;   Laterality: Left;  CDE 7.89   CATARACT EXTRACTION W/PHACO Right 03/23/2022   Procedure: CATARACT EXTRACTION PHACO AND INTRAOCULAR LENS PLACEMENT (IOC);  Surgeon: Tarri Farm, MD;  Location: [facility];  Service: Ophthalmology;  Laterality: Right;  CDE: 2.63   CHOLECYSTECTOMY     COLONOSCOPY N/A 09/22/2012   RMR: colonic polyps -removed as described above. tubular adenoma, next TCS 09/2017   COLONOSCOPY WITH PROPOFOL  N/A 09/23/2017   Surgeon: Suzette Espy, MD; internal hemorrhoids, three 5-6 mm polyps removed, diverticulosis in sigmoid and descending colon.  Pathology with tubular adenomas and rectal hyperplastic polyp.  Recommended 5 year surveillance.   COLONOSCOPY WITH PROPOFOL  N/A 11/08/2022   Procedure: COLONOSCOPY WITH PROPOFOL ;  Surgeon: Suzette Espy, MD;  [facility]: [facility];  Service: Endoscopy;  Laterality: N/A;  815am, asa 3   CYST EXCISION Left 07/19/2016   Procedure: CYST REMOVAL LEFT RING FINGER;  Surgeon: Darrin Emerald, MD;  Location: [facility];  Service: Orthopedics;  Laterality: Left;   CYST REMOVAL LEG Right    foot   ESOPHAGOGASTRODUODENOSCOPY N/A 10/21/2014   RMR: Small hiatal hernia; otherwise normal EGD status post passage of a Maloney dilator.    ESOPHAGOGASTRODUODENOSCOPY (EGD) WITH PROPOFOL  N/A 11/08/2022   Procedure: ESOPHAGOGASTRODUODENOSCOPY (EGD) WITH PROPOFOL ;  Surgeon: Suzette Espy, MD;  [facility];  Service: Endoscopy;  Laterality: N/A;   EXCISION MASS UPPER EXTREMETIES Right 04/17/2018   Procedure: EXCISION MASS UPPER EXTREMETIES right ring finger;  Surgeon: Darrin Emerald, MD;  Location: [facility];  Service: Orthopedics;  Laterality: Right;   LUMBAR LAMINECTOMY/DECOMPRESSION MICRODISCECTOMY Bilateral 11/26/2022   Procedure: Microdiscectomy - bilateral - Lumbar four-Lumbar five;  Surgeon: Agustina Aldrich, MD;  Location: [facility];  Service: Neurosurgery;  Laterality: Bilateral;   MALONEY DILATION N/A 10/21/2014   Procedure: Londa Rival DILATION;   Surgeon: Suzette Espy, MD;  [facility];  Service: Endoscopy;  Laterality: N/A;   MALONEY DILATION N/A 11/08/2022   Procedure: Londa Rival DILATION;  Surgeon: Suzette Espy, MD;  [facility];  Service: Endoscopy;  Laterality: N/A;   MIDDLE EAR SURGERY Right    patched hole in ear drum   POLYPECTOMY  09/23/2017   Procedure: POLYPECTOMY;  Surgeon: Suzette Espy, MD;  [facility];  Service: Endoscopy;;  polyp hepatic flexure polyp cs, splenic flexure polyp cs, rectal polyp cs   POLYPECTOMY  11/08/2022   Procedure: POLYPECTOMY;  Surgeon: Suzette Espy, MD;  [facility];  Service: Endoscopy;;   TOTAL ABDOMINAL HYSTERECTOMY      Family History  Problem Relation Age of Onset   Colon cancer Mother        diagnosed age 20, underwent surgical resection, metastatic disease several years later   Diabetes Mother    Heart attack Father    Diabetes  Sister    Hypothyroidism Brother    Diabetes Sister    Diabetes Sister    Diabetes Sister    Hypothyroidism Sister    Allergic rhinitis Neg Hx    Angioedema Neg Hx    Asthma Neg Hx    Atopy Neg Hx    Eczema Neg Hx    Immunodeficiency Neg Hx    Urticaria Neg Hx     Social History   Socioeconomic History   Marital status: Divorced    Spouse name: Not on file   Number of children: 2   Years of education: Not on file   Highest education level: Not on file  Occupational History   Not on file  Tobacco Use   Smoking status: Never   Smokeless tobacco: Never  Vaping Use   Vaping status: Never Used  Substance and Sexual Activity   Alcohol use: No    Alcohol/week: 0.0 standard drinks of alcohol   Drug use: No   Sexual activity: Not Currently    Birth control/protection: Surgical    Comment: Hysterectomy  Other Topics Concern   Not on file  Social History Narrative   Lives with grandson (since born)      Enjoys: vacation, travel, concerts      Diet: eats all food groups    Caffeine:  diet Pepsi daily    Water :  8 cups daily       Wears seat belt    Does use phone   Smoke Retail buyer in safe area    Social Drivers of Health   Financial Resource Strain: Low Risk  (12/02/2019)   Overall Financial Resource Strain (CARDIA)    Difficulty of Paying Living Expenses: Not hard at all  Food Insecurity: No Food Insecurity (12/02/2019)   Hunger Vital Sign    Worried About Running Out of Food in the Last Year: Never true    Ran Out of Food in the Last Year: Never true  Transportation Needs: No Transportation Needs (12/02/2019)   PRAPARE - Administrator, Civil Service (Medical): No    Lack of Transportation (Non-Medical): No  Physical Activity: Insufficiently Active (12/02/2019)   Exercise Vital Sign    Days of Exercise per Week: 2 days    Minutes of Exercise per Session: 20 min  Stress: Not on file  Social Connections: Moderately Isolated (12/02/2019)   Social Connection and Isolation Panel [NHANES]    Frequency of Communication with Friends and Family: More than three times a week    Frequency of Social Gatherings with Friends and Family: More than three times a week    Attends Religious Services: More than 4 times per year    Active Member of [employer] or Organizations: No    Attends Banker Meetings: Never    Marital Status: Divorced  Catering manager Violence: Not At Risk (12/02/2019)   Humiliation, Afraid, Rape, and Kick questionnaire    Fear of Current or Ex-Partner: No    Emotionally Abused: No    Physically Abused: No    Sexually Abused: No    Outpatient Medications Prior to Visit  Medication Sig Dispense Refill   Accu-Chek FastClix Lancets MISC TEST FOUR TIMES DAILY 100 each 0   aspirin EC 81 MG tablet Take 81 mg by mouth daily.     benzonatate  (TESSALON ) 100 MG capsule Take 1 capsule (100 mg total) by mouth 3 (three) times daily as needed for cough. 90 capsule 0  cetirizine  (ZYRTEC ) 10 MG tablet Take 1 tablet (10 mg total) by  mouth daily. 30 tablet 5   Cholecalciferol  (VITAMIN D -3) 1000 UNITS CAPS Take 1,000 Units by mouth daily.      Continuous Glucose Sensor (DEXCOM G6 SENSOR) MISC CHANGE SENSOR EVERY 10 DAYS AS DIRECTED. 3 each 5   Continuous Glucose Transmitter (DEXCOM G6 TRANSMITTER) MISC Change transmitter every 90 days as directed. 1 each 3   diclofenac  Sodium (VOLTAREN ) 1 % GEL APPLY 2 GRAMS TO AFFECTED AREAS 2 TIMES A DAY AS NEEDED 100 g 0   EPINEPHrine  0.3 mg/0.3 mL IJ SOAJ injection Inject 0.3 mg into the muscle as needed for anaphylaxis. 2 each 1   fluticasone  (FLONASE ) 50 MCG/ACT nasal spray Place 2 sprays into both nostrils daily as needed. 16 g 5   glucose blood (ACCU-CHEK GUIDE) test strip test FOUR TIMES DAILY 100 strip 0   insulin  aspart (NOVOLOG  FLEXPEN) 100 UNIT/ML FlexPen Inject 20-26 Units into the skin 3 (three) times daily with meals. 60 mL 3   ketoconazole  (NIZORAL ) 2 % cream APPLY TO AFFECTED AREAS TWICE DAILY (Patient taking differently: Apply 1 Application topically daily as needed for irritation.) 60 g 2   LANTUS  SOLOSTAR 100 UNIT/ML Solostar Pen Inject 70 Units into the skin at bedtime. 60 mL 3   levothyroxine  (SYNTHROID ) 75 MCG tablet TAKE ONE (1) TABLET BY MOUTH EVERY DAY BEFORE BREAKFAST 90 tablet 3   LITETOUCH PEN NEEDLES 31G X 8 MM MISC USE FOUR TIMES DAILY 100 each 0   metFORMIN  (GLUCOPHAGE -XR) 500 MG 24 hr tablet Take 1 tablet (500 mg total) by mouth 2 (two) times daily with a meal. 180 tablet 3   olmesartan  (BENICAR ) 40 MG tablet Take 1 tablet (40 mg total) by mouth daily. 30 tablet 3   pantoprazole  (PROTONIX ) 40 MG tablet TAKE ONE TABLET (40MG  TOTAL) BY MOUTH DAILY 30 tablet 11   pregabalin  (LYRICA ) 100 MG capsule Take 100 mg by mouth 2 (two) times daily.     Semaglutide , 1 MG/DOSE, (OZEMPIC , 1 MG/DOSE,) 4 MG/3ML SOPN Inject 1 mg into the skin once a week. 9 mL 3   tiZANidine (ZANAFLEX) 2 MG tablet Take 2 mg by mouth 2 (two) times daily.     VENTOLIN  HFA 108 (90 Base) MCG/ACT  inhaler INHALE 4 PUFFS INTO THE LUNGS EVERY 6 HOURS AS NEEDED FOR WHEEZING OR SHORTNESS OF BREATH 18 g 0   vitamin B-12 (CYANOCOBALAMIN ) 500 MCG tablet Take 500 mcg by mouth daily.     cefdinir  (OMNICEF ) 300 MG capsule Take 1 capsule (300 mg total) by mouth 2 (two) times daily. 14 capsule 0   No facility-administered medications prior to visit.    Allergies  Allergen Reactions   Kiwi Extract Anaphylaxis, Swelling and Palpitations   Other Anaphylaxis    Walnuts (avoids all tree nuts)   Statins     Transaminase elevation    ROS Review of Systems  Constitutional:  Negative for chills and fever.  HENT:  Negative for congestion, sinus pressure and sinus pain.   Eyes:  Negative for pain and discharge.  Respiratory:  Negative for cough and shortness of breath.   Cardiovascular:  Negative for chest pain and palpitations.  Gastrointestinal:  Negative for abdominal pain, diarrhea, nausea and vomiting.  Endocrine: Negative for polydipsia and polyuria.  Genitourinary:  Negative for dysuria and hematuria.  Musculoskeletal:  Positive for arthralgias and back pain. Negative for neck pain and neck stiffness.  Skin:  Positive for color change (  Mole over back).  Neurological:  Negative for dizziness and weakness.  Psychiatric/Behavioral:  Positive for sleep disturbance. Negative for agitation and behavioral problems.       Objective:    Physical Exam Vitals reviewed.  Constitutional:      General: She is not in acute distress.    Appearance: She is obese. She is not diaphoretic.  HENT:     Head: Normocephalic and atraumatic.     Nose: No congestion.     Right Sinus: Frontal sinus tenderness present.     Left Sinus: Frontal sinus tenderness present.     Mouth/Throat:     Mouth: Mucous membranes are moist.     Pharynx: No posterior oropharyngeal erythema.  Eyes:     General: No scleral icterus.    Extraocular Movements: Extraocular movements intact.  Cardiovascular:     Rate and  Rhythm: Normal rate and regular rhythm.     Heart sounds: Normal heart sounds. No murmur heard. Pulmonary:     Breath sounds: Normal breath sounds. No wheezing or rales.  Musculoskeletal:     Cervical back: Neck supple. No tenderness.     Lumbar back: Tenderness present. Positive left straight leg raise test.     Right lower leg: No edema.     Left lower leg: No edema.  Skin:    General: Skin is warm.  Neurological:     General: No focal deficit present.     Mental Status: She is alert and oriented to person, place, and time.  Psychiatric:        Mood and Affect: Mood normal.        Behavior: Behavior normal.     BP 115/87   Pulse (!) 113   Ht 5\' 5"  (1.651 m)   Wt 226 lb 9.6 oz (102.8 kg)   SpO2 96%   BMI 37.71 kg/m  Wt Readings from Last 3 Encounters:  06/19/23 226 lb 9.6 oz (102.8 kg)  06/01/23 224 lb 13.9 oz (102 kg)  05/24/23 224 lb 6 oz (101.8 kg)    Lab Results  Component Value Date   TSH 2.510 10/16/2022   Lab Results  Component Value Date   WBC 11.0 (H) 06/01/2023   HGB 12.9 06/01/2023   HCT 41.1 06/01/2023   MCV 89.5 06/01/2023   PLT 282 06/01/2023   Lab Results  Component Value Date   NA 135 06/01/2023   K 3.8 06/01/2023   CO2 21 (L) 06/01/2023   GLUCOSE 304 (H) 06/01/2023   BUN 21 06/01/2023   CREATININE 0.93 06/01/2023   BILITOT 0.5 06/01/2023   ALKPHOS 136 (H) 06/01/2023   AST 20 06/01/2023   ALT 36 06/01/2023   PROT 7.1 06/01/2023   ALBUMIN 4.0 06/01/2023   CALCIUM 9.2 06/01/2023   ANIONGAP 11 06/01/2023   EGFR 86 10/16/2022   Lab Results  Component Value Date   CHOL 160 01/16/2022   Lab Results  Component Value Date   HDL 55 01/16/2022   Lab Results  Component Value Date   LDLCALC 91 01/16/2022   Lab Results  Component Value Date   TRIG 71 01/16/2022   Lab Results  Component Value Date   CHOLHDL 2.9 01/16/2022   Lab Results  Component Value Date   HGBA1C 8.5 (A) 03/05/2023      Assessment & Plan:   Problem List  Items Addressed This Visit       Cardiovascular and Mediastinum   Essential hypertension - Primary  BP Readings from Last 1 Encounters:  06/19/23 115/87   Well-controlled with olmesartan  40 mg QD Discontinued lisinopril  due to her chronic cough in the last visit Counseled for compliance with the medications Advised DASH diet and moderate exercise/walking, at least 150 mins/week      Relevant Medications   simvastatin  (ZOCOR ) 10 MG tablet     Endocrine   Type 2 diabetes mellitus with hyperglycemia (HCC)   Lab Results  Component Value Date   HGBA1C 8.5 (A) 03/05/2023   Uncontrolled, but improving Follows up with Endocrinology On Lantus  70 units and NovoLog  ISS Continue metformin  and Ozempic  Tried to give her statin in the past, had transaminitis -will try again to add statin later Takes Lyrica  for neuropathy/low back pain      Relevant Medications   simvastatin  (ZOCOR ) 10 MG tablet     Nervous and Auditory   Lumbar radiculopathy   On Lyrica  and Norco PRN S/p microdiscectomy L4-5 (06/24) Followed by spine surgery and pain clinic Recent worsening of low back pain, could be due to muscle strain/stiffness from prolonged sitting in a bus -improved with Medrol  Dosepak Flexeril  as needed for muscle spasms        Genitourinary   Acute cystitis without hematuria   Was given Cefdinir  for UTI, symptoms improved now Advised to maintain adequate hydration Recheck UA with reflex culture      Relevant Orders   UA/M w/rflx Culture, Routine     Other   Morbid obesity (HCC)   BMI Readings from Last 3 Encounters:  06/19/23 37.71 kg/m  06/01/23 37.42 kg/m  05/24/23 37.34 kg/m   Associated with HTN, DM, HLD and lumbar spondylosis Diet modification and moderate exercise advised On Ozempic  for type II DM      Mixed hyperlipidemia   Last lipid profile reviewed Was on statin due to DM, had held statin for now due to elevated liver enzymes - restart simvastatin  as her  liver enzymes have normalized now      Relevant Medications   simvastatin  (ZOCOR ) 10 MG tablet   Encounter for examination following treatment at hospital   ER chart reviewed, including blood tests Was given Cefdinir  for UTI, symptoms improved now Advised to maintain adequate hydration       Meds ordered this encounter  Medications   simvastatin  (ZOCOR ) 10 MG tablet    Sig: Take 1 tablet (10 mg total) by mouth daily at 6 PM.    Dispense:  90 tablet    Refill:  1    Follow-up: Return in about 4 months (around 10/17/2023) for HTN and DM.    Meldon Sport, MD

## 2023-06-19 NOTE — Assessment & Plan Note (Addendum)
 Was given Cefdinir  for UTI, symptoms improved now Advised to maintain adequate hydration Recheck UA with reflex culture

## 2023-06-20 LAB — UA/M W/RFLX CULTURE, ROUTINE
Bilirubin, UA: NEGATIVE
Ketones, UA: NEGATIVE
Leukocytes,UA: NEGATIVE
Nitrite, UA: NEGATIVE
RBC, UA: NEGATIVE
Specific Gravity, UA: >=1.03 — AB (ref 1.005–1.030)
Urobilinogen, Ur: 0.2 mg/dL (ref 0.2–1.0)
pH, UA: 5.5 (ref 5.0–7.5)

## 2023-06-20 LAB — MICROSCOPIC EXAMINATION
Bacteria, UA: NONE SEEN
Casts: NONE SEEN /[LPF]

## 2023-06-28 ENCOUNTER — Other Ambulatory Visit: Payer: Self-pay

## 2023-06-28 ENCOUNTER — Ambulatory Visit (HOSPITAL_COMMUNITY): Payer: Medicaid Other | Attending: Student

## 2023-06-28 DIAGNOSIS — M7062 Trochanteric bursitis, left hip: Secondary | ICD-10-CM | POA: Diagnosis not present

## 2023-06-28 DIAGNOSIS — M6281 Muscle weakness (generalized): Secondary | ICD-10-CM | POA: Insufficient documentation

## 2023-06-28 DIAGNOSIS — R262 Difficulty in walking, not elsewhere classified: Secondary | ICD-10-CM | POA: Insufficient documentation

## 2023-06-28 NOTE — Therapy (Signed)
Marland Kitchen OUTPATIENT PHYSICAL THERAPY EVALUATION (THORACOLUMBAR)   Patient Name: Sandy Valencia MRN: [id number] DOB:[birth year], 63 y.o., female Today's Date: 06/28/2023  END OF SESSION:   PT End of Session - 06/28/23 1102     Visit Number 1    Number of Visits 6    Date for PT Re-Evaluation 08/09/23    Authorization Type [place] Medicaid [employer]    Authorization Time Period req 06/18/23    PT Start Time 1100    PT Stop Time 1140    PT Time Calculation (min) 40 min    Activity Tolerance Patient tolerated treatment well;Patient limited by pain    Behavior During Therapy [facility] for tasks assessed/performed                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   Anaphylactic shock, unspecified, subsequent encounter    Anemia, iron deficiency 02/14/2015   patient is not aware this dx   Angio-edema    Arthritis    Asthma    Mild intermittent asthma   Asymptomatic varicose veins of right lower extremity    Chronic obstructive pulmonary disease with (acute) exacerbation (HCC)    patient denies this dx   Chronic rhinitis 05/21/2018   COVID-19 2019   Diabetes mellitus    type 2   Essential (primary) hypertension    Family history of colon cancer 09/03/2012   Fatty liver    Gastro-esophageal reflux disease without esophagitis    GERD (gastroesophageal reflux disease)    Hearing loss    wears hearing aid   History of colonic polyps 08/26/2017   Hypercholesteremia    Hypertension    Hypothyroidism    Low back pain    and left leg pain   Mass of finger, right s/p surgical excision (mucoid cyst) 04/17/18 04/22/2018   Mucous cyst of digit of right hand    Obesity, unspecified    Other adverse food reactions, not elsewhere classified, initial encounter    Personal history of noncompliance with medical treatment, presenting hazards to health 05/18/2015   Rash and other nonspecific skin  eruption    Restless legs syndrome    patient denies this dx   Sleep apnea    pt siad, "i had small amount but could not tolerate CPAP and they said it was ok since it was mild.   Unspecified asthma, uncomplicated    Viral URI 07/13/2022   Zoster without complications    Past Surgical History:  Procedure Laterality Date   ABDOMINAL HYSTERECTOMY     BACK SURGERY  11/26/2022   BIOPSY  11/08/2022   Procedure: BIOPSY;  Surgeon: Corbin Ade, MD;  [facility];  Service: Endoscopy;;   CATARACT EXTRACTION W/PHACO Left 03/09/2022   Procedure: CATARACT EXTRACTION PHACO AND INTRAOCULAR LENS PLACEMENT (IOC);  Surgeon: Fabio Pierce, MD;  Location: [facility];  Service: Ophthalmology;  Laterality: Left;  CDE 7.89   CATARACT EXTRACTION W/PHACO Right 03/23/2022   Procedure: CATARACT EXTRACTION PHACO AND INTRAOCULAR LENS PLACEMENT (IOC);  Surgeon: Fabio Pierce, MD;  Location: [facility];  Service: Ophthalmology;  Laterality: Right;  CDE: 2.63   CHOLECYSTECTOMY     COLONOSCOPY N/A 09/22/2012   RMR: colonic polyps -removed as described above. tubular adenoma, next TCS 09/2017   COLONOSCOPY WITH PROPOFOL N/A 09/23/2017   Surgeon: Corbin Ade, MD; internal hemorrhoids, three 5-6 mm polyps removed, diverticulosis  in sigmoid and descending colon.  Pathology with tubular adenomas and rectal hyperplastic polyp.  Recommended 5 year surveillance.   COLONOSCOPY WITH PROPOFOL N/A 11/08/2022   Procedure: COLONOSCOPY WITH PROPOFOL;  Surgeon: Corbin Ade, MD;  [facility];  Service: Endoscopy;  Laterality: N/A;  815am, asa 3   CYST EXCISION Left 07/19/2016   Procedure: CYST REMOVAL LEFT RING FINGER;  Surgeon: Vickki Hearing, MD;  Location: [facility];  Service: Orthopedics;  Laterality: Left;   CYST REMOVAL LEG Right    foot   ESOPHAGOGASTRODUODENOSCOPY N/A 10/21/2014   RMR: Small hiatal hernia; otherwise normal EGD status post passage of a Maloney dilator.     ESOPHAGOGASTRODUODENOSCOPY (EGD) WITH PROPOFOL N/A 11/08/2022   Procedure: ESOPHAGOGASTRODUODENOSCOPY (EGD) WITH PROPOFOL;  Surgeon: Corbin Ade, MD;  [facility];  Service: Endoscopy;  Laterality: N/A;   EXCISION MASS UPPER EXTREMETIES Right 04/17/2018   Procedure: EXCISION MASS UPPER EXTREMETIES right ring finger;  Surgeon: Vickki Hearing, MD;  Location: [facility];  Service: Orthopedics;  Laterality: Right;   LUMBAR LAMINECTOMY/DECOMPRESSION MICRODISCECTOMY Bilateral 11/26/2022   Procedure: Microdiscectomy - bilateral - Lumbar four-Lumbar five;  Surgeon: Julio Sicks, MD;  Location: [facility] OR;  Service: Neurosurgery;  Laterality: Bilateral;   MALONEY DILATION N/A 10/21/2014   Procedure: Elease Hashimoto DILATION;  Surgeon: Corbin Ade, MD;  [facility];  Service: Endoscopy;  Laterality: N/A;   MALONEY DILATION N/A 11/08/2022   Procedure: Elease Hashimoto DILATION;  Surgeon: Corbin Ade, MD;  [facility];  Service: Endoscopy;  Laterality: N/A;   MIDDLE EAR SURGERY Right    patched hole in ear drum   POLYPECTOMY  09/23/2017   Procedure: POLYPECTOMY;  Surgeon: Corbin Ade, MD;  [facility];  Service: Endoscopy;;  polyp hepatic flexure polyp cs, splenic flexure polyp cs, rectal polyp cs   POLYPECTOMY  11/08/2022   Procedure: POLYPECTOMY;  Surgeon: Corbin Ade, MD;  [facility];  Service: Endoscopy;;   TOTAL ABDOMINAL HYSTERECTOMY     Patient Active Problem List   Diagnosis Date Noted   Acute cystitis without hematuria 06/19/2023   Acute bronchitis 05/24/2023   Chronic mastoiditis of right side 03/24/2023   Varicose veins of right lower extremity 01/22/2023   Acute pain of left lower extremity 10/25/2022   Primary insomnia 09/18/2022   Encounter for examination following treatment at hospital 09/18/2022   Acute non-recurrent frontal sinusitis 07/20/2022   Lumbar radiculopathy 03/08/2022   Statin intolerance 01/19/2022   Encounter  for general adult medical examination with abnormal findings 01/16/2022   Lumbar spondylosis 09/05/2021   Allergic rhinitis due to allergen 04/17/2021   Seborrheic keratosis 04/17/2021   Primary osteoarthritis involving multiple joints 12/02/2019   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   Long-term current use of opiate analgesic 09/08/2019   Neck pain 09/07/2019   Mild intermittent asthma without complication 05/21/2018   Gastroesophageal reflux disease 05/21/2018   Non-allergic rhinitis 05/21/2018   History of colonic polyps 08/26/2017   Hypothyroidism 04/22/2017   Mixed hyperlipidemia 12/31/2016   Type 2 diabetes mellitus with hyperglycemia (HCC) 05/18/2015   Essential hypertension 05/18/2015   Morbid obesity (HCC) 05/18/2015   Fatty liver 02/14/2015   Hiatal hernia    Dysphagia, pharyngoesophageal phase    Dysphagia 10/11/2014    PCP: Anabel Halon, MDPCP - General   REFERRING PROVIDER:   Floreen Comber, NP    REFERRING DIAG:  Diagnosis  M54.16 (ICD-10-CM) -  Radiculopathy, lumbar region    Rationale for Evaluation and Treatment: Rehabilitation  THERAPY DIAG:  Difficulty in walking, not elsewhere classified - Plan: PT plan of care cert/re-cert  Trochanteric bursitis of left hip - Plan: PT plan of care cert/re-cert  Muscle weakness (generalized) - Plan: PT plan of care cert/re-cert  ONSET DATE: May 18 2023    SUBJECTIVE:                                                                                                                                                                                           SUBJECTIVE STATEMENT: Patient was in [place] around December 14. Patient reports walking a lot on her trip to [facility] and her low back/ left lower extremity. Pt went to MD 12/20 who gave her muscle relaxer, no cortisone. Patient said it felt better until last week she was out at [facility] and it re-flared.   PERTINENT HISTORY:  Microdiscectomy  11/26/2022    PAIN:  Are you having pain? Yes: NPRS scale: 6/10 Pain location: left hip- trochanter   Pain description: sharp  Aggravating factors: walking  Relieving factors: rest   PRECAUTIONS: None  RED FLAGS: None   WEIGHT BEARING RESTRICTIONS: No  FALLS:  Has patient fallen in last 6 months? No  LIVING ENVIRONMENT: Lives with: lives alone Lives in: House/apartment Stairs: No Has following equipment at home: Single point cane   OCCUPATION: CNA  PLOF: Independent   PATIENT GOALS: To walk pain free   NEXT MD VISIT: Feb 2025    OBJECTIVE:   DIAGNOSTIC FINDINGS:  IMPRESSION: 1. Uncomplicated interval L4-5 micro-discectomy with resolved neural compression. 2. Unchanged L3-4 left foraminal impingement due to herniation and asymmetric facet spurring. 3. No new herniation or affected level.    PATIENT SURVEYS:  LEFS 30/80  SCREENING FOR RED FLAGS: Bowel or bladder incontinence: No Spinal tumors: No Cauda equina syndrome: No Compression fracture: No Abdominal aneurysm: No  COGNITION: Overall cognitive status: Within functional limits for tasks assessed  POSTURE: flexed trunk  and weight shift right      FUNCTIONAL TESTS:  5 times sit to stand: next session   GAIT ANALYSIS: Distance walked: 17ft Assistive device utilized: None Level of assistance: Complete Independence Comments: Patient with moderate antalgia left lower extremity; decreased hip flexion in swing phase  SENSATION: WFL      LOWER EXTREMITY ROM:  MMT Right eval Left eval  Hip flexion  0-90 pain  Hip extension    Hip abduction    Hip adduction    Hip internal rotation  0-15 pain  Hip external rotation  0-25 pain  Knee flexion  Knee extension    Ankle dorsiflexion    Ankle plantarflexion    Ankle inversion    Ankle eversion     (Blank rows = not tested)      LOWER EXTREMITY MMT:     Active  Right eval Left eval  Hip flexion  3-/5  Hip extension    Hip  abduction    Hip adduction    Hip internal rotation  3-/5  Hip external rotation  3-/5  Knee flexion    Knee extension    Ankle dorsiflexion    Ankle plantarflexion    Ankle inversion    Ankle eversion     (Blank rows = not tested) PALPATION: Moderate tenderness to palpation left trochanteric bursae,  Moderate tenderness to palpation left glut medius    TODAY'S TREATMENT:                                                                                                                              DATE:   06/28/23 PT IE HEP- see below   PATIENT EDUCATION:  Education details: HEP, pain management using cane Person educated: Patient Education method: Explanation and Demonstration Education comprehension: verbalized understanding  HOME EXERCISE PROGRAM: Access Code: [email or web address] URL: [email or web address] Date: 06/28/2023 Prepared by: Seymour Bars  Exercises - Clamshell with Resistance  - 1-2 x daily - 2 sets - 10 reps - Sidelying Reverse Clamshell with Resistance  - 1-2 x daily - 2 sets - 10 reps  Patient Education - Reciprocal Gait with Cane    ASSESSMENT:  CLINICAL IMPRESSION: Patient is a 63 y.o. y.o. female who was seen today for physical therapy evaluation and treatment for difficulty walking, left hip bursitis, weakness. Patient presents to PT with the following objective impairments: Abnormal gait, decreased activity tolerance, difficulty walking, decreased ROM, decreased strength, improper body mechanics, postural dysfunction, obesity, and pain. These impairments limit the patient in activities such as carrying, lifting, bending, standing, squatting, stairs, and locomotion level. These impairments also limit the patient in participation such as meal prep, cleaning, laundry, driving, shopping, community activity, occupation, and yard work. The patient will benefit from PT to address the limitations/impairments listed below to return to their prior level of  function in the domains of activity and participation.    PERSONAL FACTORS:  n/a  are also affecting patient's functional outcome.   REHAB POTENTIAL: Good  CLINICAL DECISION MAKING: Stable/uncomplicated  EVALUATION COMPLEXITY: Low    GOALS: Goals reviewed with patient? No  SHORT TERM GOALS: Target date: 3 sessions   1. Patient will be independent with a basic stretching/strengthening HEP  Baseline:  Goal status: INITIAL   LONG TERM GOALS: Target date: 6 sessions   Patient will be able to score a >/= 40/80 on the LEFS    to demonstrate an improvement in overall housework, ADL completion, mobility, and self-care.Baseline:  Goal status: INITIAL  2.   Patient will complete the 5 times sit to stand:  within 15 seconds with no pain  to demonstrate an improvement lower extremity strength needed for home and community ambulation  Baseline:  Goal status: INITIAL  3.  Patient will be independent with a comprehensive strengthening HEP  Baseline:  Goal status: INITIAL  4. Patient will be able to demo left hip flexion active range of motion 0-100 with no pain to facilitate squatting, bending  Baseline:  Goal status: INITIAL    PLAN:  PT FREQUENCY: 1x/week  PT DURATION:  6 sessions  PLANNED INTERVENTIONS: 97110-Therapeutic exercises, 97530- Therapeutic activity, 97112- Neuromuscular re-education, 97535- Self Care, [id number]- Manual therapy, [phone]- Gait training, Patient/Family education, Balance training, Stair training, Dry Needling, Joint mobilization, Joint manipulation, Spinal manipulation, Spinal mobilization, Cryotherapy, and Moist heat.  PLAN FOR NEXT SESSION: Progress hip strengthening/ active range of motion for HEP; perform 5TSTS     Seymour Bars, PT 06/28/2023, 12:01 PM

## 2023-07-02 DIAGNOSIS — E039 Hypothyroidism, unspecified: Secondary | ICD-10-CM | POA: Diagnosis not present

## 2023-07-02 DIAGNOSIS — Z794 Long term (current) use of insulin: Secondary | ICD-10-CM | POA: Diagnosis not present

## 2023-07-02 DIAGNOSIS — E1165 Type 2 diabetes mellitus with hyperglycemia: Secondary | ICD-10-CM | POA: Diagnosis not present

## 2023-07-03 LAB — COMPREHENSIVE METABOLIC PANEL
ALT: 35 [IU]/L — ABNORMAL HIGH (ref 0–32)
AST: 24 [IU]/L (ref 0–40)
Albumin: 4.1 g/dL (ref 3.9–4.9)
Alkaline Phosphatase: 78 [IU]/L (ref 44–121)
BUN/Creatinine Ratio: 14 (ref 12–28)
BUN: 10 mg/dL (ref 8–27)
Bilirubin Total: 0.2 mg/dL (ref 0.0–1.2)
CO2: 24 mmol/L (ref 20–29)
Calcium: 9.4 mg/dL (ref 8.7–10.3)
Chloride: 103 mmol/L (ref 96–106)
Creatinine, Ser: 0.73 mg/dL (ref 0.57–1.00)
Globulin, Total: 2.5 g/dL (ref 1.5–4.5)
Glucose: 140 mg/dL — ABNORMAL HIGH (ref 70–99)
Potassium: 4.1 mmol/L (ref 3.5–5.2)
Sodium: 141 mmol/L (ref 134–144)
Total Protein: 6.6 g/dL (ref 6.0–8.5)
eGFR: 94 mL/min/{1.73_m2} (ref >59)

## 2023-07-03 LAB — LIPID PANEL
Chol/HDL Ratio: 2.5 {ratio} (ref 0.0–4.4)
Cholesterol, Total: 125 mg/dL (ref 100–199)
HDL: 51 mg/dL (ref >39)
LDL Chol Calc (NIH): 59 mg/dL (ref 0–99)
Triglycerides: 74 mg/dL (ref 0–149)
VLDL Cholesterol Cal: 15 mg/dL (ref 5–40)

## 2023-07-03 LAB — TSH: TSH: 2.3 u[IU]/mL (ref 0.450–4.500)

## 2023-07-03 LAB — T4, FREE: Free T4: 1.21 ng/dL (ref 0.82–1.77)

## 2023-07-04 ENCOUNTER — Encounter (HOSPITAL_COMMUNITY): Payer: Self-pay

## 2023-07-04 ENCOUNTER — Ambulatory Visit (HOSPITAL_COMMUNITY): Payer: Medicaid Other

## 2023-07-04 DIAGNOSIS — M6281 Muscle weakness (generalized): Secondary | ICD-10-CM | POA: Diagnosis not present

## 2023-07-04 DIAGNOSIS — R262 Difficulty in walking, not elsewhere classified: Secondary | ICD-10-CM

## 2023-07-04 DIAGNOSIS — M7062 Trochanteric bursitis, left hip: Secondary | ICD-10-CM

## 2023-07-04 NOTE — Therapy (Signed)
[facility] OUTPATIENT PHYSICAL THERAPY TREATMENT (THORACOLUMBAR)   Patient Name: Sandy Valencia MRN: [id number] DOB:09/03/1960, 63 y.o., female Today's Date: 07/04/2023  END OF SESSION:   PT End of Session - 07/04/23 1144     Visit Number 2    Number of Visits 6    Date for PT Re-Evaluation 08/09/23    Authorization Type [place] [employer]    Authorization Time Period healthy blue approved 9 visits from 06/28/2023-08/26/2023    Authorization - Visit Number 1    Authorization - Number of Visits 9    Progress Note Due on Visit 6    PT Start Time 1147    PT Stop Time 1228    PT Time Calculation (min) 41 min    Activity Tolerance Patient tolerated treatment well    Behavior During Therapy [facility] for tasks assessed/performed                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   Anaphylactic shock, unspecified, subsequent encounter    Anemia, iron deficiency 02/14/2015   patient is not aware this dx   Angio-edema    Arthritis    Asthma    Mild intermittent asthma   Asymptomatic varicose veins of right lower extremity    Chronic obstructive pulmonary disease with (acute) exacerbation (HCC)    patient denies this dx   Chronic rhinitis 05/21/2018   COVID-19 2019   Diabetes mellitus    type 2   Essential (primary) hypertension    Family history of colon cancer 09/03/2012   Fatty liver    Gastro-esophageal reflux disease without esophagitis    GERD (gastroesophageal reflux disease)    Hearing loss    wears hearing aid   History of colonic polyps 08/26/2017   Hypercholesteremia    Hypertension    Hypothyroidism    Low back pain    and left leg pain   Mass of finger, right s/p surgical excision (mucoid cyst) 04/17/18 04/22/2018   Mucous cyst of digit of right hand    Obesity, unspecified    Other adverse food reactions, not elsewhere classified, initial encounter    Personal  history of noncompliance with medical treatment, presenting hazards to health 05/18/2015   Rash and other nonspecific skin eruption    Restless legs syndrome    patient denies this dx   Sleep apnea    pt siad, "i had small amount but could not tolerate CPAP and they said it was ok since it was mild.   Unspecified asthma, uncomplicated    Viral URI 07/13/2022   Zoster without complications    Past Surgical History:  Procedure Laterality Date   ABDOMINAL HYSTERECTOMY     BACK SURGERY  11/26/2022   BIOPSY  11/08/2022   Procedure: BIOPSY;  Surgeon: Corbin Ade, MD;  [facility];  Service: Endoscopy;;   CATARACT EXTRACTION W/PHACO Left 03/09/2022   Procedure: CATARACT EXTRACTION PHACO AND INTRAOCULAR LENS PLACEMENT (IOC);  Surgeon: Fabio Pierce, MD;  Location: [facility];  Service: Ophthalmology;  Laterality: Left;  CDE 7.89   CATARACT EXTRACTION W/PHACO Right 03/23/2022   Procedure: CATARACT EXTRACTION PHACO AND INTRAOCULAR LENS PLACEMENT (IOC);  Surgeon: Fabio Pierce, MD;  Location: [facility];  Service: Ophthalmology;  Laterality: Right;  CDE: 2.63   CHOLECYSTECTOMY     COLONOSCOPY N/A 09/22/2012   RMR: colonic polyps -removed as described  above. tubular adenoma, next TCS 09/2017   COLONOSCOPY WITH PROPOFOL N/A 09/23/2017   Surgeon: Corbin Ade, MD; internal hemorrhoids, three 5-6 mm polyps removed, diverticulosis in sigmoid and descending colon.  Pathology with tubular adenomas and rectal hyperplastic polyp.  Recommended 5 year surveillance.   COLONOSCOPY WITH PROPOFOL N/A 11/08/2022   Procedure: COLONOSCOPY WITH PROPOFOL;  Surgeon: Corbin Ade, MD;  [facility];  Service: Endoscopy;  Laterality: N/A;  815am, asa 3   CYST EXCISION Left 07/19/2016   Procedure: CYST REMOVAL LEFT RING FINGER;  Surgeon: Vickki Hearing, MD;  Location: [facility];  Service: Orthopedics;  Laterality: Left;   CYST REMOVAL LEG Right    foot   ESOPHAGOGASTRODUODENOSCOPY N/A  10/21/2014   RMR: Small hiatal hernia; otherwise normal EGD status post passage of a Maloney dilator.    ESOPHAGOGASTRODUODENOSCOPY (EGD) WITH PROPOFOL N/A 11/08/2022   Procedure: ESOPHAGOGASTRODUODENOSCOPY (EGD) WITH PROPOFOL;  Surgeon: Corbin Ade, MD;  [facility];  Service: Endoscopy;  Laterality: N/A;   EXCISION MASS UPPER EXTREMETIES Right 04/17/2018   Procedure: EXCISION MASS UPPER EXTREMETIES right ring finger;  Surgeon: Vickki Hearing, MD;  Location: [facility];  Service: Orthopedics;  Laterality: Right;   LUMBAR LAMINECTOMY/DECOMPRESSION MICRODISCECTOMY Bilateral 11/26/2022   Procedure: Microdiscectomy - bilateral - Lumbar four-Lumbar five;  Surgeon: Julio Sicks, MD;  [facility];  Service: Neurosurgery;  Laterality: Bilateral;   MALONEY DILATION N/A 10/21/2014   Procedure: Elease Hashimoto DILATION;  Surgeon: Corbin Ade, MD;  [facility];  Service: Endoscopy;  Laterality: N/A;   MALONEY DILATION N/A 11/08/2022   Procedure: Elease Hashimoto DILATION;  Surgeon: Corbin Ade, MD;  [facility];  Service: Endoscopy;  Laterality: N/A;   MIDDLE EAR SURGERY Right    patched hole in ear drum   POLYPECTOMY  09/23/2017   Procedure: POLYPECTOMY;  Surgeon: Corbin Ade, MD;  [facility];  Service: Endoscopy;;  polyp hepatic flexure polyp cs, splenic flexure polyp cs, rectal polyp cs   POLYPECTOMY  11/08/2022   Procedure: POLYPECTOMY;  Surgeon: Corbin Ade, MD;  [facility];  Service: Endoscopy;;   TOTAL ABDOMINAL HYSTERECTOMY     Patient Active Problem List   Diagnosis Date Noted   Acute cystitis without hematuria 06/19/2023   Acute bronchitis 05/24/2023   Chronic mastoiditis of right side 03/24/2023   Varicose veins of right lower extremity 01/22/2023   Acute pain of left lower extremity 10/25/2022   Primary insomnia 09/18/2022   Encounter for examination following treatment at hospital 09/18/2022   Acute non-recurrent  frontal sinusitis 07/20/2022   Lumbar radiculopathy 03/08/2022   Statin intolerance 01/19/2022   Encounter for general adult medical examination with abnormal findings 01/16/2022   Lumbar spondylosis 09/05/2021   Allergic rhinitis due to allergen 04/17/2021   Seborrheic keratosis 04/17/2021   Primary osteoarthritis involving multiple joints 12/02/2019   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   Long-term current use of opiate analgesic 09/08/2019   Neck pain 09/07/2019   Mild intermittent asthma without complication 05/21/2018   Gastroesophageal reflux disease 05/21/2018   Non-allergic rhinitis 05/21/2018   History of colonic polyps 08/26/2017   Hypothyroidism 04/22/2017   Mixed hyperlipidemia 12/31/2016   Type 2 diabetes mellitus with hyperglycemia (HCC) 05/18/2015   Essential hypertension 05/18/2015   Morbid obesity (HCC) 05/18/2015   Fatty liver 02/14/2015   Hiatal hernia    Dysphagia, pharyngoesophageal phase    Dysphagia 10/11/2014  PCP: Anabel Halon, MDPCP - General   REFERRING PROVIDER:   Floreen Comber, NP    REFERRING DIAG:  Diagnosis  M54.16 (ICD-10-CM) - Radiculopathy, lumbar region    Rationale for Evaluation and Treatment: Rehabilitation  THERAPY DIAG:  Difficulty in walking, not elsewhere classified  Trochanteric bursitis of left hip  Muscle weakness (generalized)  ONSET DATE: May 18 2023    SUBJECTIVE:                                                                                                                                                                                           SUBJECTIVE STATEMENT: 07/04/23:  Pt stated she has began the HEP with reports of a lot of relief with exercises.  Pain scale 3/10 Lt hip while walking, no pain while sitting or laying down.  Pt stated she has began walking with cane, feels it helped.  Patient was in [place] around December 14. Patient reports walking a lot on her trip to [place]  and her low back/ left lower extremity. Pt went to [facility] 12/20 who gave her muscle relaxer, no cortisone. Patient said it felt better until last week she was out at [facility] and it re-flared.   PERTINENT HISTORY:  Microdiscectomy 11/26/2022    PAIN:  Are you having pain? Yes: NPRS scale: 6/10 Pain location: left hip- trochanter   Pain description: sharp  Aggravating factors: walking  Relieving factors: rest   PRECAUTIONS: None  RED FLAGS: None   WEIGHT BEARING RESTRICTIONS: No  FALLS:  Has patient fallen in last 6 months? No  LIVING ENVIRONMENT: Lives with: lives alone Lives in: House/apartment Stairs: No Has following equipment at home: Single point cane   OCCUPATION: CNA  PLOF: Independent   PATIENT GOALS: To walk pain free   NEXT MD VISIT: Feb 2025  07/17/23     OBJECTIVE:   DIAGNOSTIC FINDINGS:  IMPRESSION: 1. Uncomplicated interval L4-5 micro-discectomy with resolved neural compression. 2. Unchanged L3-4 left foraminal impingement due to herniation and asymmetric facet spurring. 3. No new herniation or affected level.    PATIENT SURVEYS:  LEFS 30/80  SCREENING FOR RED FLAGS: Bowel or bladder incontinence: No Spinal tumors: No Cauda equina syndrome: No Compression fracture: No Abdominal aneurysm: No  COGNITION: Overall cognitive status: Within functional limits for tasks assessed  POSTURE: flexed trunk  and weight shift right      FUNCTIONAL TESTS:  5 times sit to stand: 07/04/23: 8.04" no HHA   GAIT ANALYSIS: Distance walked: 38ft Assistive device utilized: None Level of assistance: Complete Independence Comments: Patient with moderate antalgia left lower extremity; decreased hip flexion  in swing phase  SENSATION: WFL      LOWER EXTREMITY ROM:  MMT Right eval Left eval  Hip flexion  0-90 pain  Hip extension    Hip abduction    Hip adduction    Hip internal rotation  0-15 pain  Hip external rotation  0-25 pain  Knee  flexion    Knee extension    Ankle dorsiflexion    Ankle plantarflexion    Ankle inversion    Ankle eversion     (Blank rows = not tested)      LOWER EXTREMITY MMT:     Active  Right eval Left eval  Hip flexion  3-/5  Hip extension    Hip abduction    Hip adduction    Hip internal rotation  3-/5  Hip external rotation  3-/5  Knee flexion    Knee extension    Ankle dorsiflexion    Ankle plantarflexion    Ankle inversion    Ankle eversion     (Blank rows = not tested) PALPATION: Moderate tenderness to palpation left trochanteric bursae,  Moderate tenderness to palpation left glut medius    TODAY'S TREATMENT:                                                                                                                              DATE:  07/04/23:  Reviewed goals Educated importance of HEP  5 STS no HHA 8.04" Sidelying:  Clam with GTB 10x 5" - good form and mechanics   Reverse clam with GTB 10x 5"   Abduction 10x Supine:  Bridge with GTB around thigh 2x 10  SKTC 2x 30  LTR 5x 10"  Marching with ab set 10x 5"  Standing: 3D hip excursion (lateral weight shift with UE overhead 5 reps, rotation 5reps , STS 10x eccentric control no HHA, lumbar extension 5x)  06/28/23 PT IE HEP- see below   PATIENT EDUCATION:  Education details: HEP, pain management using cane Person educated: Patient Education method: Explanation and Demonstration Education comprehension: verbalized understanding  HOME EXERCISE PROGRAM: Access Code: [email or web address] URL: [email or web address] Date: 06/28/2023 Prepared by: Seymour Bars  Exercises - Clamshell with Resistance  - 1-2 x daily - 2 sets - 10 reps - Sidelying Reverse Clamshell with Resistance  - 1-2 x daily - 2 sets - 10 reps  Patient Education - Reciprocal Gait with Cane  Access Code: [email or web address] URL: [email or web address] Date: 07/04/2023 Prepared by: Becky Sax  Exercises - Bridge with  Resistance  - 1-2 x daily - 7 x weekly - 2 sets - 10 reps - 5" hold - Supine March  - 1 x daily - 7 x weekly - 3 sets - 10 reps - Sit to Stand  - 1 x daily - 7 x weekly - 2 sets - 10 reps  Patient Education - Reciprocal Gait with Cane   ASSESSMENT:  CLINICAL IMPRESSION: 07/04/23:  Reviewed goals and educated importance of HEP  compliance for maximal benefits.  Pt able to recall and demonstrate appropriate mechanics and form with current exercise program.  Session focus this session with proximal strengthening and stretches for hip mobility.  Pt tolerated well to new exercises with additional exercises added to HEP.  No reports of pain through session.  Eval: Patient is a 63 y.o. y.o. female who was seen today for physical therapy evaluation and treatment for difficulty walking, left hip bursitis, weakness. Patient presents to PT with the following objective impairments: Abnormal gait, decreased activity tolerance, difficulty walking, decreased ROM, decreased strength, improper body mechanics, postural dysfunction, obesity, and pain. These impairments limit the patient in activities such as carrying, lifting, bending, standing, squatting, stairs, and locomotion level. These impairments also limit the patient in participation such as meal prep, cleaning, laundry, driving, shopping, community activity, occupation, and yard work. The patient will benefit from PT to address the limitations/impairments listed below to return to their prior level of function in the domains of activity and participation.    PERSONAL FACTORS:  n/a  are also affecting patient's functional outcome.   REHAB POTENTIAL: Good  CLINICAL DECISION MAKING: Stable/uncomplicated  EVALUATION COMPLEXITY: Low    GOALS: Goals reviewed with patient? No  SHORT TERM GOALS: Target date: 3 sessions   1. Patient will be independent with a basic stretching/strengthening HEP  Baseline:  Goal status: INITIAL   LONG TERM GOALS:  Target date: 6 sessions   Patient will be able to score a >/= 40/80 on the LEFS    to demonstrate an improvement in overall housework, ADL completion, mobility, and self-care.Baseline:  Goal status: INITIAL  2.   Patient will complete the 5 times sit to stand:    within 15 seconds with no pain  to demonstrate an improvement lower extremity strength needed for home and community ambulation  Baseline: 07/04/23: 5 STS 8.04" Goal status: MET  3.  Patient will be independent with a comprehensive strengthening HEP  Baseline:  Goal status: INITIAL  4. Patient will be able to demo left hip flexion active range of motion 0-100 with no pain to facilitate squatting, bending  Baseline:  Goal status: INITIAL    PLAN:  PT FREQUENCY: 1x/week  PT DURATION:  6 sessions  PLANNED INTERVENTIONS: [id number]-Therapeutic exercises, 97530- Therapeutic activity, 97112- Neuromuscular re-education, 97535- Self Care, [id number]- Manual therapy, [phone]- Gait training, Patient/Family education, Balance training, Stair training, Dry Needling, Joint mobilization, Joint manipulation, Spinal manipulation, Spinal mobilization, Cryotherapy, and Moist heat.  PLAN FOR NEXT SESSION: Progress hip strengthening/ active range of motion for HEP   Becky Sax, LPTA/CLT; CBIS [phone]  Juel Burrow, PTA 07/04/2023, 2:55 PM

## 2023-07-08 ENCOUNTER — Ambulatory Visit: Payer: Medicaid Other | Admitting: Nurse Practitioner

## 2023-07-08 DIAGNOSIS — E559 Vitamin D deficiency, unspecified: Secondary | ICD-10-CM

## 2023-07-08 DIAGNOSIS — E782 Mixed hyperlipidemia: Secondary | ICD-10-CM

## 2023-07-08 DIAGNOSIS — E1165 Type 2 diabetes mellitus with hyperglycemia: Secondary | ICD-10-CM

## 2023-07-08 DIAGNOSIS — E039 Hypothyroidism, unspecified: Secondary | ICD-10-CM

## 2023-07-08 DIAGNOSIS — I10 Essential (primary) hypertension: Secondary | ICD-10-CM

## 2023-07-08 DIAGNOSIS — Z7985 Long-term (current) use of injectable non-insulin antidiabetic drugs: Secondary | ICD-10-CM

## 2023-07-08 DIAGNOSIS — Z794 Long term (current) use of insulin: Secondary | ICD-10-CM

## 2023-07-08 DIAGNOSIS — Z7984 Long term (current) use of oral hypoglycemic drugs: Secondary | ICD-10-CM

## 2023-07-08 LAB — HM DIABETES EYE EXAM

## 2023-07-10 DIAGNOSIS — L600 Ingrowing nail: Secondary | ICD-10-CM | POA: Diagnosis not present

## 2023-07-10 DIAGNOSIS — B351 Tinea unguium: Secondary | ICD-10-CM | POA: Diagnosis not present

## 2023-07-10 DIAGNOSIS — L03031 Cellulitis of right toe: Secondary | ICD-10-CM | POA: Diagnosis not present

## 2023-07-10 DIAGNOSIS — L03032 Cellulitis of left toe: Secondary | ICD-10-CM | POA: Diagnosis not present

## 2023-07-11 ENCOUNTER — Ambulatory Visit (HOSPITAL_COMMUNITY): Payer: Medicaid Other | Attending: Student | Admitting: Physical Therapy

## 2023-07-11 DIAGNOSIS — M7062 Trochanteric bursitis, left hip: Secondary | ICD-10-CM | POA: Insufficient documentation

## 2023-07-11 DIAGNOSIS — R262 Difficulty in walking, not elsewhere classified: Secondary | ICD-10-CM | POA: Insufficient documentation

## 2023-07-11 DIAGNOSIS — M6281 Muscle weakness (generalized): Secondary | ICD-10-CM | POA: Insufficient documentation

## 2023-07-11 NOTE — Therapy (Signed)
 [facility]REAT[facility] (THORACOLUMBAR)   Patient Name: Sandy Valencia MRN: [id number] DOB:02/11/1961, 63 y.o., female Today's Date: 07/11/2023  END OF SESSION:   PT End of Session - 07/11/23 1226     Visit Number 3    Number of Visits 6    Date for PT Re-Evaluation 08/09/23    Authorization Type [place] [employer]    Authorization Time Period healthy blue approved 9 visits from 06/28/2023-08/26/2023    Authorization - Visit Number 2    Authorization - Number of Visits 9    Progress Note Due on Visit 6    PT Start Time 1146    PT Stop Time 1227    PT Time Calculation (min) 41 min    Activity Tolerance Patient tolerated treatment well    Behavior During Therapy [facility] for tasks assessed/performed                 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   Anaphylactic shock, unspecified, subsequent encounter    Anemia, iron deficiency 02/14/2015   patient is not aware this dx   Angio-edema    Arthritis    Asthma    Mild intermittent asthma   Asymptomatic varicose veins of right lower extremity    Chronic obstructive pulmonary disease with (acute) exacerbation (HCC)    patient denies this dx   Chronic rhinitis 05/21/2018   COVID-19 2019   Diabetes mellitus    type 2   Essential (primary) hypertension    Family history of colon cancer 09/03/2012   Fatty liver    Gastro-esophageal reflux disease without esophagitis    GERD (gastroesophageal reflux disease)    Hearing loss    wears hearing aid   History of colonic polyps 08/26/2017   Hypercholesteremia    Hypertension    Hypothyroidism    Low back pain    and left leg pain   Mass of finger, right s/p surgical excision (mucoid cyst) 04/17/18 04/22/2018   Mucous cyst of digit of right hand    Obesity, unspecified    Other adverse food reactions, not elsewhere classified, initial encounter    Personal  history of noncompliance with medical treatment, presenting hazards to health 05/18/2015   Rash and other nonspecific skin eruption    Restless legs syndrome    patient denies this dx   Sleep apnea    pt siad, i had small amount but could not tolerate CPAP and they said it was ok since it was mild.   Unspecified asthma, uncomplicated    Viral URI 07/13/2022   Zoster without complications    Past Surgical History:  Procedure Laterality Date   ABDOMINAL HYSTERECTOMY     BACK SURGERY  11/26/2022   BIOPSY  11/08/2022   Procedure: BIOPSY;  Surgeon: Shaaron Lamar HERO, MD;  [facility];  Service: Endoscopy;;   CATARACT EXTRACTION W/PHACO Left 03/09/2022   Procedure: CATARACT EXTRACTION PHACO AND INTRAOCULAR LENS PLACEMENT (IOC);  Surgeon: Harrie Agent, MD;  Location: [facility];  Service: Ophthalmology;  Laterality: Left;  CDE 7.89   CATARACT EXTRACTION W/PHACO Right 03/23/2022   Procedure: CATARACT EXTRACTION PHACO AND INTRAOCULAR LENS PLACEMENT (IOC);  Surgeon: Harrie Agent, MD;  Location: [facility];  Service: Ophthalmology;  Laterality: Right;  CDE: 2.63   CHOLECYSTECTOMY     COLONOSCOPY N/A 09/22/2012   RMR: colonic polyps -removed as  described above. tubular adenoma, next TCS 09/2017   COLONOSCOPY WITH PROPOFOL  N/A 09/23/2017   Surgeon: Shaaron Lamar HERO, MD; internal hemorrhoids, three 5-6 mm polyps removed, diverticulosis in sigmoid and descending colon.  Pathology with tubular adenomas and rectal hyperplastic polyp.  Recommended 5 year surveillance.   COLONOSCOPY WITH PROPOFOL  N/A 11/08/2022   Procedure: COLONOSCOPY WITH PROPOFOL ;  Surgeon: Shaaron Lamar HERO, MD;  [facility];  Service: Endoscopy;  Laterality: N/A;  815am, asa 3   CYST EXCISION Left 07/19/2016   Procedure: CYST REMOVAL LEFT RING FINGER;  Surgeon: Taft FORBES Minerva, MD;  Location: [facility];  Service: Orthopedics;  Laterality: Left;   CYST REMOVAL LEG Right    foot   ESOPHAGOGASTRODUODENOSCOPY N/A  10/21/2014   RMR: Small hiatal hernia; otherwise normal EGD status post passage of a Maloney dilator.    ESOPHAGOGASTRODUODENOSCOPY (EGD) WITH PROPOFOL  N/A 11/08/2022   Procedure: ESOPHAGOGASTRODUODENOSCOPY (EGD) WITH PROPOFOL ;  Surgeon: Shaaron Lamar HERO, MD;  [facility];  Service: Endoscopy;  Laterality: N/A;   EXCISION MASS UPPER EXTREMETIES Right 04/17/2018   Procedure: EXCISION MASS UPPER EXTREMETIES right ring finger;  Surgeon: Minerva Taft FORBES, MD;  Location: [facility];  Service: Orthopedics;  Laterality: Right;   LUMBAR LAMINECTOMY/DECOMPRESSION MICRODISCECTOMY Bilateral 11/26/2022   Procedure: Microdiscectomy - bilateral - Lumbar four-Lumbar five;  Surgeon: Louis Shove, MD;  [facility];  Service: Neurosurgery;  Laterality: Bilateral;   MALONEY DILATION N/A 10/21/2014   Procedure: AGAPITO DILATION;  Surgeon: Lamar HERO Shaaron, MD;  [facility];  Service: Endoscopy;  Laterality: N/A;   MALONEY DILATION N/A 11/08/2022   Procedure: AGAPITO DILATION;  Surgeon: Shaaron Lamar HERO, MD;  [facility];  Service: Endoscopy;  Laterality: N/A;   MIDDLE EAR SURGERY Right    patched hole in ear drum   POLYPECTOMY  09/23/2017   Procedure: POLYPECTOMY;  Surgeon: Shaaron Lamar HERO, MD;  [facility];  Service: Endoscopy;;  polyp hepatic flexure polyp cs, splenic flexure polyp cs, rectal polyp cs   POLYPECTOMY  11/08/2022   Procedure: POLYPECTOMY;  Surgeon: Shaaron Lamar HERO, MD;  [facility];  Service: Endoscopy;;   TOTAL ABDOMINAL HYSTERECTOMY     Patient Active Problem List   Diagnosis Date Noted   Acute cystitis without hematuria 06/19/2023   Acute bronchitis 05/24/2023   Chronic mastoiditis of right side 03/24/2023   Varicose veins of right lower extremity 01/22/2023   Acute pain of left lower extremity 10/25/2022   Primary insomnia 09/18/2022   Encounter for examination following treatment at hospital 09/18/2022   Acute non-recurrent  frontal sinusitis 07/20/2022   Lumbar radiculopathy 03/08/2022   Statin intolerance 01/19/2022   Encounter for general adult medical examination with abnormal findings 01/16/2022   Lumbar spondylosis 09/05/2021   Allergic rhinitis due to allergen 04/17/2021   Seborrheic keratosis 04/17/2021   Primary osteoarthritis involving multiple joints 12/02/2019   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   Long-term current use of opiate analgesic 09/08/2019   Neck pain 09/07/2019   Mild intermittent asthma without complication 05/21/2018   Gastroesophageal reflux disease 05/21/2018   Non-allergic rhinitis 05/21/2018   History of colonic polyps 08/26/2017   Hypothyroidism 04/22/2017   Mixed hyperlipidemia 12/31/2016   Type 2 diabetes mellitus with hyperglycemia (HCC) 05/18/2015   Essential hypertension 05/18/2015   Morbid obesity (HCC) 05/18/2015   Fatty liver 02/14/2015   Hiatal hernia    Dysphagia, pharyngoesophageal phase    Dysphagia 10/11/2014  PCP: Tobie Suzzane POUR, MDPCP - General   REFERRING PROVIDER:   Jennetta Gerard BIRCH, NP    REFERRING DIAG:  Diagnosis  M54.16 (ICD-10-CM) - Radiculopathy, lumbar region    Rationale for Evaluation and Treatment: Rehabilitation  THERAPY DIAG:  Difficulty in walking, not elsewhere classified  Trochanteric bursitis of left hip  Muscle weakness (generalized)  ONSET DATE: May 18 2023    SUBJECTIVE:                                                                                                                                                                                           SUBJECTIVE STATEMENT: 07/04/23:  Pt stated she has began the HEP with reports of a lot of relief with exercises.  Pain scale 3/10 Lt hip while walking, no pain while sitting or laying down.  Pt stated she has began walking with cane, feels it helped.  Patient was in [place] around December 14. Patient reports walking a lot on her trip to [facility]  and her low back/ left lower extremity. Pt went to MD 12/20 who gave her muscle relaxer, no cortisone. Patient said it felt better until last week she was out at [facility] and it re-flared.   PERTINENT HISTORY:  Microdiscectomy 11/26/2022    PAIN:  Are you having pain? Yes: NPRS scale: 6/10 Pain location: left hip- trochanter   Pain description: sharp  Aggravating factors: walking  Relieving factors: rest   PRECAUTIONS: None  RED FLAGS: None   WEIGHT BEARING RESTRICTIONS: No  FALLS:  Has patient fallen in last 6 months? No  LIVING ENVIRONMENT: Lives with: lives alone Lives in: House/apartment Stairs: No Has following equipment at home: Single point cane   OCCUPATION: CNA  PLOF: Independent   PATIENT GOALS: To walk pain free   NEXT MD VISIT: Feb 2025  07/17/23     OBJECTIVE:   DIAGNOSTIC FINDINGS:  IMPRESSION: 1. Uncomplicated interval L4-5 micro-discectomy with resolved neural compression. 2. Unchanged L3-4 left foraminal impingement due to herniation and asymmetric facet spurring. 3. No new herniation or affected level.    PATIENT SURVEYS:  LEFS 30/80  SCREENING FOR RED FLAGS: Bowel or bladder incontinence: No Spinal tumors: No Cauda equina syndrome: No Compression fracture: No Abdominal aneurysm: No  COGNITION: Overall cognitive status: Within functional limits for tasks assessed  POSTURE: flexed trunk  and weight shift right      FUNCTIONAL TESTS:  5 times sit to stand: 07/04/23: 8.04 no HHA   GAIT ANALYSIS: Distance walked: 66ft Assistive device utilized: None Level of assistance: Complete Independence Comments: Patient with moderate antalgia left lower extremity; decreased hip flexion  in swing phase  SENSATION: WFL      LOWER EXTREMITY ROM:  MMT Right eval Left eval  Hip flexion  0-90 pain  Hip extension    Hip abduction    Hip adduction    Hip internal rotation  0-15 pain  Hip external rotation  0-25 pain  Knee  flexion    Knee extension    Ankle dorsiflexion    Ankle plantarflexion    Ankle inversion    Ankle eversion     (Blank rows = not tested)      LOWER EXTREMITY MMT:     Active  Right eval Left eval  Hip flexion  3-/5  Hip extension    Hip abduction    Hip adduction    Hip internal rotation  3-/5  Hip external rotation  3-/5  Knee flexion    Knee extension    Ankle dorsiflexion    Ankle plantarflexion    Ankle inversion    Ankle eversion     (Blank rows = not tested) PALPATION: Moderate tenderness to palpation left trochanteric bursae,  Moderate tenderness to palpation left glut medius    TODAY'S TREATMENT:                                                                                                                              DATE:  07/09/23 Heel raise x 10 Squat x 10 Step up x 10 B Side step with red theraband x 10  Single leg stance B   x 5 reps each  ITband stretch 15x 3  Marching x 10  Back against wall shoulder retraction x 10 UE flexion x 5 Nustep level 2 x minutes     07/04/23:  Reviewed goals Educated importance of HEP  5 STS no HHA 8.04 Sidelying:  Clam with GTB 10x 5 - good form and mechanics   Reverse clam with GTB 10x 5   Abduction 10x Supine:  Bridge with GTB around thigh 2x 10  SKTC 2x 30  LTR 5x 10  Marching with ab set 10x 5  Standing: 3D hip excursion (lateral weight shift with UE overhead 5 reps, rotation 5reps , STS 10x eccentric control no HHA, lumbar extension 5x)  06/28/23 PT IE HEP- see below   PATIENT EDUCATION:  Education details: HEP, pain management using cane Person educated: Patient Education method: Explanation and Demonstration Education comprehension: verbalized understanding  HOME EXERCISE PROGRAM: Access Code: [email or web address] URL: [email or web address] Date: 06/28/2023 Prepared by: Deward Ming  Exercises - Clamshell with Resistance  - 1-2 x daily - 2 sets - 10 reps - Sidelying Reverse  Clamshell with Resistance  - 1-2 x daily - 2 sets - 10 reps  Patient Education - Reciprocal Gait with Cane  Access Code: [email or web address] URL: [email or web address] Date: 07/04/2023 Prepared by: Augustin Mclean  Exercises - Bridge with Resistance  - 1-2 x daily - 7 x weekly - 2 sets - 10 reps - 5  hold - Supine March  - 1 x daily - 7 x weekly - 3 sets - 10 reps - Sit to Stand  - 1 x daily - 7 x weekly - 2 sets - 10 reps  Patient Education - Reciprocal Gait with [place] 07/11/23 - Heel Raises with Counter Support  - 1 x daily - 7 x weekly - 1 sets - 10 reps - 3-5 hold - Side Stepping with Resistance at Ankles  - 1 x daily - 7 x weekly - 1 sets - 2 reps - Mini Squat  - 1 x daily - 7 x weekly - 1 sets - 10 reps - 3-5 hold Tensor stretch   ASSESSMENT:  CLINICAL IMPRESSION: Progressed HEP to standing activity.  Pt demonstrates good technique although exercises are fatigues pt.    Pt tolerated well to new exercises with additional exercises added to HEP.  Pt vocalizes LE  Eval: Patient is a 63 y.o. y.o. female who was seen today for physical therapy evaluation and treatment for difficulty walking, left hip bursitis, weakness. Patient presents to PT with the following objective impairments: Abnormal gait, decreased activity tolerance, difficulty walking, decreased ROM, decreased strength, improper body mechanics, postural dysfunction, obesity, and pain. These impairments limit the patient in activities such as carrying, lifting, bending, standing, squatting, stairs, and locomotion level. These impairments also limit the patient in participation such as meal prep, cleaning, laundry, driving, shopping, community activity, occupation, and yard work. The patient will benefit from PT to address the limitations/impairments listed below to return to their prior level of function in the domains of activity and participation.    PERSONAL FACTORS:  n/a  are also affecting patient's functional  outcome.   REHAB POTENTIAL: Good  CLINICAL DECISION MAKING: Stable/uncomplicated  EVALUATION COMPLEXITY: Low    GOALS: Goals reviewed with patient? No  SHORT TERM GOALS: Target date: 3 sessions   1. Patient will be independent with a basic stretching/strengthening HEP  Baseline:  Goal status:on-going    LONG TERM GOALS: Target date: 6 sessions   Patient will be able to score a >/= 40/80 on the LEFS    to demonstrate an improvement in overall housework, ADL completion, mobility, and self-care.Baseline:  Goal status: on-going   2.   Patient will complete the 5 times sit to stand:    within 15 seconds with no pain  to demonstrate an improvement lower extremity strength needed for home and community ambulation  Baseline: 07/04/23: 5 STS 8.04 Goal status: MET  3.  Patient will be independent with a comprehensive strengthening HEP  Baseline:  Goal status: on-going  4. Patient will be able to demo left hip flexion active range of motion 0-100 with no pain to facilitate squatting, bending  Baseline:  Goal status: on-going    PLAN:  PT FREQUENCY: 1x/week  PT DURATION:  6 sessions  PLANNED INTERVENTIONS: 97110-Therapeutic exercises, 97530- Therapeutic activity, 97112- Neuromuscular re-education, 97535- Self Care, [id number]- Manual therapy, [phone]- Gait training, Patient/Family education, Balance training, Stair training, Dry Needling, Joint mobilization, Joint manipulation, Spinal manipulation, Spinal mobilization, Cryotherapy, and Moist heat.  PLAN FOR NEXT SESSION: Progress hip strengthening/ active range of motion for HEP; give HEP for single leg raise and narrow base of support with eyes closed.    Montie Metro, PT CLT [phone] 07/11/2023, 12:27 PM

## 2023-07-16 ENCOUNTER — Encounter: Payer: Self-pay | Admitting: Nurse Practitioner

## 2023-07-16 ENCOUNTER — Other Ambulatory Visit: Payer: Self-pay | Admitting: Nurse Practitioner

## 2023-07-16 ENCOUNTER — Ambulatory Visit: Payer: Medicaid Other | Admitting: Nurse Practitioner

## 2023-07-16 VITALS — BP 130/80 | HR 84 | Ht 65.0 in | Wt 222.4 lb

## 2023-07-16 DIAGNOSIS — Z7985 Long-term (current) use of injectable non-insulin antidiabetic drugs: Secondary | ICD-10-CM

## 2023-07-16 DIAGNOSIS — Z794 Long term (current) use of insulin: Secondary | ICD-10-CM | POA: Diagnosis not present

## 2023-07-16 DIAGNOSIS — E039 Hypothyroidism, unspecified: Secondary | ICD-10-CM | POA: Diagnosis not present

## 2023-07-16 DIAGNOSIS — Z7984 Long term (current) use of oral hypoglycemic drugs: Secondary | ICD-10-CM

## 2023-07-16 DIAGNOSIS — I10 Essential (primary) hypertension: Secondary | ICD-10-CM

## 2023-07-16 DIAGNOSIS — E559 Vitamin D deficiency, unspecified: Secondary | ICD-10-CM | POA: Diagnosis not present

## 2023-07-16 DIAGNOSIS — E1165 Type 2 diabetes mellitus with hyperglycemia: Secondary | ICD-10-CM

## 2023-07-16 DIAGNOSIS — E782 Mixed hyperlipidemia: Secondary | ICD-10-CM

## 2023-07-16 LAB — POCT GLYCOSYLATED HEMOGLOBIN (HGB A1C): Hemoglobin A1C: 9.5 % — AB (ref 4.0–5.6)

## 2023-07-16 NOTE — Progress Notes (Signed)
07/16/2023   Endocrinology follow-up note   Subjective:    Patient ID: Sandy Valencia, female    DOB: 09/26/1960, PCP Anabel Halon, MD   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   Anaphylactic shock, unspecified, subsequent encounter    Anemia, iron deficiency 02/14/2015   patient is not aware this dx   Angio-edema    Arthritis    Asthma    Mild intermittent asthma   Asymptomatic varicose veins of right lower extremity    Chronic obstructive pulmonary disease with (acute) exacerbation (HCC)    patient denies this dx   Chronic rhinitis 05/21/2018   COVID-19 2019   Diabetes mellitus    type 2   Essential (primary) hypertension    Family history of colon cancer 09/03/2012   Fatty liver    Gastro-esophageal reflux disease without esophagitis    GERD (gastroesophageal reflux disease)    Hearing loss    wears hearing aid   History of colonic polyps 08/26/2017   Hypercholesteremia    Hypertension    Hypothyroidism    Low back pain    and left leg pain   Mass of finger, right s/p surgical excision (mucoid cyst) 04/17/18 04/22/2018   Mucous cyst of digit of right hand    Obesity, unspecified    Other adverse food reactions, not elsewhere classified, initial encounter    Personal history of noncompliance with medical treatment, presenting hazards to health 05/18/2015   Rash and other nonspecific skin eruption    Restless legs syndrome    patient denies this dx   Sleep apnea    pt siad, "i had small amount but could not tolerate CPAP and they said it was ok since it was mild.   Unspecified asthma, uncomplicated    Viral URI 07/13/2022   Zoster without complications    Past Surgical History:  Procedure Laterality Date   ABDOMINAL HYSTERECTOMY     BACK SURGERY  11/26/2022   BIOPSY  11/08/2022   Procedure: BIOPSY;  Surgeon: Corbin Ade, MD;  [facility];   Service: Endoscopy;;   CATARACT EXTRACTION W/PHACO Left 03/09/2022   Procedure: CATARACT EXTRACTION PHACO AND INTRAOCULAR LENS PLACEMENT (IOC);  Surgeon: Fabio Pierce, MD;  Location: [facility];  Service: Ophthalmology;  Laterality: Left;  CDE 7.89   CATARACT EXTRACTION W/PHACO Right 03/23/2022   Procedure: CATARACT EXTRACTION PHACO AND INTRAOCULAR LENS PLACEMENT (IOC);  Surgeon: Fabio Pierce, MD;  Location: [facility];  Service: Ophthalmology;  Laterality: Right;  CDE: 2.63   CHOLECYSTECTOMY     COLONOSCOPY N/A 09/22/2012   RMR: colonic polyps -removed as described above. tubular adenoma, next TCS 09/2017   COLONOSCOPY WITH PROPOFOL N/A 09/23/2017   Surgeon: Corbin Ade, MD; internal hemorrhoids, three 5-6 mm polyps removed, diverticulosis in sigmoid and descending colon.  Pathology with tubular adenomas and rectal hyperplastic polyp.  Recommended 5 year surveillance.   COLONOSCOPY WITH PROPOFOL N/A 11/08/2022   Procedure: COLONOSCOPY WITH PROPOFOL;  Surgeon: Corbin Ade, MD;  [facility];  Service: Endoscopy;  Laterality: N/A;  815am, asa 3   CYST EXCISION Left 07/19/2016   Procedure: CYST REMOVAL LEFT RING FINGER;  Surgeon: Vickki Hearing, MD;  Location: [facility];  Service: Orthopedics;  Laterality: Left;   CYST REMOVAL LEG Right    foot   ESOPHAGOGASTRODUODENOSCOPY N/A 10/21/2014   RMR: Small hiatal hernia;  otherwise normal EGD status post passage of a Maloney dilator.    ESOPHAGOGASTRODUODENOSCOPY (EGD) WITH PROPOFOL N/A 11/08/2022   Procedure: ESOPHAGOGASTRODUODENOSCOPY (EGD) WITH PROPOFOL;  Surgeon: Corbin Ade, MD;  [facility];  Service: Endoscopy;  Laterality: N/A;   EXCISION MASS UPPER EXTREMETIES Right 04/17/2018   Procedure: EXCISION MASS UPPER EXTREMETIES right ring finger;  Surgeon: Vickki Hearing, MD;  Location: [facility];  Service: Orthopedics;  Laterality: Right;   LUMBAR LAMINECTOMY/DECOMPRESSION MICRODISCECTOMY Bilateral 11/26/2022    Procedure: Microdiscectomy - bilateral - Lumbar four-Lumbar five;  Surgeon: Julio Sicks, MD;  Location: [facility];  Service: Neurosurgery;  Laterality: Bilateral;   MALONEY DILATION N/A 10/21/2014   Procedure: Elease Hashimoto DILATION;  Surgeon: Corbin Ade, MD;  [facility];  Service: Endoscopy;  Laterality: N/A;   MALONEY DILATION N/A 11/08/2022   Procedure: Elease Hashimoto DILATION;  Surgeon: Corbin Ade, MD;  [facility];  Service: Endoscopy;  Laterality: N/A;   MIDDLE EAR SURGERY Right    patched hole in ear drum   POLYPECTOMY  09/23/2017   Procedure: POLYPECTOMY;  Surgeon: Corbin Ade, MD;  [facility];  Service: Endoscopy;;  polyp hepatic flexure polyp cs, splenic flexure polyp cs, rectal polyp cs   POLYPECTOMY  11/08/2022   Procedure: POLYPECTOMY;  Surgeon: Corbin Ade, MD;  [facility];  Service: Endoscopy;;   TOTAL ABDOMINAL HYSTERECTOMY     Social History   Socioeconomic History   Marital status: Divorced    Spouse name: Not on file   Number of children: 2   Years of education: Not on file   Highest education level: Not on file  Occupational History   Not on file  Tobacco Use   Smoking status: Never   Smokeless tobacco: Never  Vaping Use   Vaping status: Never Used  Substance and Sexual Activity   Alcohol use: No    Alcohol/week: 0.0 standard drinks of alcohol   Drug use: No   Sexual activity: Not Currently    Birth control/protection: Surgical    Comment: Hysterectomy  Other Topics Concern   Not on file  Social History Narrative   Lives with grandson (since born)      Enjoys: vacation, travel, concerts      Diet: eats all food groups    Caffeine: diet Pepsi daily    Water:  8 cups daily       Wears seat belt    Does use phone   Smoke Retail buyer in safe area    Social Drivers of Health   Financial Resource Strain: Low Risk  (12/02/2019)   Overall Financial Resource Strain (CARDIA)    Difficulty of  Paying Living Expenses: Not hard at all  Food Insecurity: No Food Insecurity (12/02/2019)   Hunger Vital Sign    Worried About Running Out of Food in the Last Year: Never true    Ran Out of Food in the Last Year: Never true  Transportation Needs: No Transportation Needs (12/02/2019)   PRAPARE - Administrator, Civil Service (Medical): No    Lack of Transportation (Non-Medical): No  Physical Activity: Insufficiently Active (12/02/2019)   Exercise Vital Sign    Days of Exercise per Week: 2 days    Minutes of Exercise per Session: 20 min  Stress: Not on file  Social Connections: Moderately Isolated (12/02/2019)   Social Connection and Isolation Panel [NHANES]    Frequency of Communication  with Friends and Family: More than three times a week    Frequency of Social Gatherings with Friends and Family: More than three times a week    Attends Religious Services: More than 4 times per year    Active Member of [employer] or Organizations: No    Attends Banker Meetings: Never    Marital Status: Divorced   Outpatient Encounter Medications as of 07/16/2023  Medication Sig   Accu-Chek FastClix Lancets MISC TEST FOUR TIMES DAILY   aspirin EC 81 MG tablet Take 81 mg by mouth daily.   benzonatate (TESSALON) 100 MG capsule Take 1 capsule (100 mg total) by mouth 3 (three) times daily as needed for cough.   cetirizine (ZYRTEC) 10 MG tablet Take 1 tablet (10 mg total) by mouth daily.   Cholecalciferol (VITAMIN D-3) 1000 UNITS CAPS Take 1,000 Units by mouth daily.    Continuous Glucose Sensor (DEXCOM G6 SENSOR) MISC CHANGE SENSOR EVERY 10 DAYS AS DIRECTED.   Continuous Glucose Transmitter (DEXCOM G6 TRANSMITTER) MISC Change transmitter every 90 days as directed.   diclofenac Sodium (VOLTAREN) 1 % GEL APPLY 2 GRAMS TO AFFECTED AREAS 2 TIMES A DAY AS NEEDED   EPINEPHrine 0.3 mg/0.3 mL IJ SOAJ injection Inject 0.3 mg into the muscle as needed for anaphylaxis.   fluticasone (FLONASE) 50  MCG/ACT nasal spray Place 2 sprays into both nostrils daily as needed.   glucose blood (ACCU-CHEK GUIDE) test strip test FOUR TIMES DAILY   insulin aspart (NOVOLOG FLEXPEN) 100 UNIT/ML FlexPen Inject 20-26 Units into the skin 3 (three) times daily with meals.   ketoconazole (NIZORAL) 2 % cream APPLY TO AFFECTED AREAS TWICE DAILY (Patient taking differently: Apply 1 Application topically daily as needed for irritation.)   LANTUS SOLOSTAR 100 UNIT/ML Solostar Pen Inject 70 Units into the skin at bedtime.   levothyroxine (SYNTHROID) 75 MCG tablet TAKE ONE (1) TABLET BY MOUTH EVERY DAY BEFORE BREAKFAST   LITETOUCH PEN NEEDLES 31G X 8 MM MISC USE FOUR TIMES DAILY   metFORMIN (GLUCOPHAGE-XR) 500 MG 24 hr tablet Take 1 tablet (500 mg total) by mouth 2 (two) times daily with a meal.   olmesartan (BENICAR) 40 MG tablet Take 1 tablet (40 mg total) by mouth daily.   pantoprazole (PROTONIX) 40 MG tablet TAKE ONE TABLET (40MG  TOTAL) BY MOUTH DAILY   pregabalin (LYRICA) 100 MG capsule Take 100 mg by mouth 2 (two) times daily.   Semaglutide, 1 MG/DOSE, (OZEMPIC, 1 MG/DOSE,) 4 MG/3ML SOPN Inject 1 mg into the skin once a week.   simvastatin (ZOCOR) 10 MG tablet Take 1 tablet (10 mg total) by mouth daily at 6 PM.   tiZANidine (ZANAFLEX) 2 MG tablet Take 2 mg by mouth 2 (two) times daily.   VENTOLIN HFA 108 (90 Base) MCG/ACT inhaler INHALE 4 PUFFS INTO THE LUNGS EVERY 6 HOURS AS NEEDED FOR WHEEZING OR SHORTNESS OF BREATH   vitamin B-12 (CYANOCOBALAMIN) 500 MCG tablet Take 500 mcg by mouth daily.   No facility-administered encounter medications on file as of 07/16/2023.   ALLERGIES: Allergies  Allergen Reactions   Kiwi Extract Anaphylaxis, Swelling and Palpitations   Other Anaphylaxis    Walnuts (avoids all tree nuts)   Statins     Transaminase elevation   VACCINATION STATUS: Immunization History  Administered Date(s) Administered   Influenza,inj,Quad PF,6+ Mos 04/15/2017, 03/30/2019    Influenza-Unspecified 04/17/2011, 03/24/2012, 05/12/2013, 02/23/2015, 04/04/2017, 04/08/2018   Moderna Sars-Covid-2 Vaccination 10/22/2019, 11/19/2019   Tdap 09/18/2021   Zoster  Recombinant(Shingrix) 09/14/2021, 01/16/2022    Diabetes She presents for her follow-up diabetic visit. She has type 2 diabetes mellitus. Onset time: She was diagnosed at approximate age of 66 years. Her disease course has been worsening. There are no hypoglycemic associated symptoms. Pertinent negatives for hypoglycemia include no confusion, nervousness/anxiousness, pallor or seizures. Pertinent negatives for diabetes include no fatigue, no polydipsia, no polyphagia, no polyuria and no weight loss. There are no hypoglycemic complications. Symptoms are stable. There are no diabetic complications. Risk factors for coronary artery disease include dyslipidemia, diabetes mellitus, hypertension, sedentary lifestyle, obesity, post-menopausal and stress. Current diabetic treatment includes intensive insulin program and oral agent (monotherapy) (and Ozempic). She is compliant with treatment most of the time (has been off Ozempic for about 6 weeks). Her weight is fluctuating minimally. She is following a generally healthy diet. When asked about meal planning, she reported none. She has had a previous visit with a dietitian. She never participates in exercise. Her home blood glucose trend is decreasing steadily. Her overall blood glucose range is >200 mg/dl. (She presents today with her CGM showing above target glycemic profile overall.  Her POCT A1c today 9.5%, increasing from last visit of 8.5%.  Analysis of her CGM shows TIR 20%, TAR 80%, TBR 0%.  She has been off her Ozempic for about 6 weeks, was going through PT and did not want to feel sick during that. She has plans to restart this soon.) An ACE inhibitor/angiotensin II receptor blocker is being taken. She sees a podiatrist.Eye exam is current.  Hyperlipidemia This is a chronic  problem. The current episode started more than 1 year ago. The problem is controlled. Recent lipid tests were reviewed and are normal. Exacerbating diseases include diabetes, hypothyroidism and obesity. There are no known factors aggravating her hyperlipidemia. Pertinent negatives include no myalgias. Current antihyperlipidemic treatment includes statins. The current treatment provides significant improvement of lipids. Compliance problems include medication cost, adherence to diet and adherence to exercise.  Risk factors for coronary artery disease include diabetes mellitus, dyslipidemia, hypertension, obesity, a sedentary lifestyle and post-menopausal.  Hypertension This is a chronic problem. The current episode started more than 1 year ago. The problem has been gradually improving since onset. The problem is controlled. Pertinent negatives include no palpitations. Agents associated with hypertension include thyroid hormones. Risk factors for coronary artery disease include diabetes mellitus, dyslipidemia, obesity, post-menopausal state, sedentary lifestyle and stress. Past treatments include ACE inhibitors. The current treatment provides mild improvement. Compliance problems include diet and exercise.  Identifiable causes of hypertension include sleep apnea and a thyroid problem.  Thyroid Problem Presents for follow-up visit. Patient reports no anxiety, cold intolerance, constipation, depressed mood, diarrhea, dry skin, fatigue, hair loss, heat intolerance, palpitations, weight gain or weight loss. The symptoms have been stable. Her past medical history is significant for diabetes.     Review of systems  Constitutional: + decreasing body weight,  current Body mass index is 37.01 kg/m. , no fatigue, no subjective hyperthermia, no subjective hypothermia Eyes: no blurry vision, no xerophthalmia ENT: no sore throat, no nodules palpated in throat, no dysphagia/odynophagia, no hoarseness Cardiovascular:  no chest pain, no shortness of breath, no palpitations, no leg swelling Respiratory: no cough, no shortness of breath Gastrointestinal: no nausea/vomiting/diarrhea Musculoskeletal: chronic back pain Skin: no rashes, no hyperemia Neurological: no tremors, no numbness, no tingling, no dizziness Psychiatric: no depression, no anxiety   Objective:    BP 130/80 (BP Location: Left Arm, Patient Position: Sitting, Cuff Size: Large)  Pulse 84   Ht 5\' 5"  (1.651 m)   Wt 222 lb 6.4 oz (100.9 kg)   BMI 37.01 kg/m   Wt Readings from Last 3 Encounters:  07/16/23 222 lb 6.4 oz (100.9 kg)  06/19/23 226 lb 9.6 oz (102.8 kg)  06/01/23 224 lb 13.9 oz (102 kg)    BP Readings from Last 3 Encounters:  07/16/23 130/80  06/19/23 115/87  06/02/23 (!) 122/56      Physical Exam- Limited  Constitutional:  Body mass index is 37.01 kg/m. , not in acute distress, normal state of mind Eyes:  EOMI, no exophthalmos Musculoskeletal: no gross deformities, strength intact in all four extremities, no gross restriction of joint movements Skin:  no rashes, no hyperemia Neurological: no tremor with outstretched hands    Lipid Panel     Component Value Date/Time   CHOL 125 07/02/2023 0805   TRIG 74 07/02/2023 0805   HDL 51 07/02/2023 0805   CHOLHDL 2.5 07/02/2023 0805   CHOLHDL 2.4 12/25/2019 0709   VLDL 10 03/07/2016 0705   LDLCALC 59 07/02/2023 0805   LDLCALC 65 12/25/2019 0709    Recent Results (from the past 2160 hours)  Urinalysis, Routine w reflex microscopic -Urine, Clean Catch     Status: Abnormal   Collection Time: 06/01/23 11:01 PM  Result Value Ref Range   Color, Urine AMBER (A) YELLOW   APPearance CLOUDY (A) CLEAR   Specific Gravity, Urine 1.014 1.005 - 1.030   pH 6.0 5.0 - 8.0   Glucose, UA >=500 (A) NEGATIVE mg/dL   Hgb urine dipstick MODERATE (A) NEGATIVE   Bilirubin Urine NEGATIVE NEGATIVE   Ketones, ur NEGATIVE NEGATIVE mg/dL   Protein, ur 161 (A) NEGATIVE mg/dL   Nitrite  NEGATIVE NEGATIVE   Leukocytes,Ua MODERATE (A) NEGATIVE    Comment: Performed at [facility], [address] [place], [place] [address]  CBC with Differential     Status: Abnormal   Collection Time: 06/01/23 11:43 PM  Result Value Ref Range   WBC 11.0 (H) 4.0 - 10.5 K/uL   RBC 4.59 3.87 - 5.11 MIL/uL   Hemoglobin 12.9 12.0 - 15.0 g/dL   HCT 54.0 98.1 - 19.1 %   MCV 89.5 80.0 - 100.0 fL   MCH 28.1 26.0 - 34.0 pg   MCHC 31.4 30.0 - 36.0 g/dL   RDW 47.8 29.5 - 62.1 %   Platelets 282 150 - 400 K/uL   nRBC 0.0 0.0 - 0.2 %   Neutrophils Relative % 72 %   Neutro Abs 8.1 (H) 1.7 - 7.7 K/uL   Lymphocytes Relative 19 %   Lymphs Abs 2.1 0.7 - 4.0 K/uL   Monocytes Relative 6 %   Monocytes Absolute 0.6 0.1 - 1.0 K/uL   Eosinophils Relative 1 %   Eosinophils Absolute 0.1 0.0 - 0.5 K/uL   Basophils Relative 1 %   Basophils Absolute 0.1 0.0 - 0.1 K/uL   Immature Granulocytes 1 %   Abs Immature Granulocytes 0.05 0.00 - 0.07 K/uL    Comment: Performed at [facility], [address], [place], [place] [address]  Comprehensive metabolic panel     Status: Abnormal   Collection Time: 06/01/23 11:43 PM  Result Value Ref Range   Sodium 135 135 - 145 mmol/L   Potassium 3.8 3.5 - 5.1 mmol/L   Chloride 103 98 - 111 mmol/L   CO2 21 (L) 22 - 32 mmol/L   Glucose, Bld 304 (H) 70 - 99 mg/dL  Comment: Glucose reference range applies only to samples taken after fasting for at least 8 hours.   BUN 21 8 - 23 mg/dL   Creatinine, Ser 1.61 0.44 - 1.00 mg/dL   Calcium 9.2 8.9 - 09.6 mg/dL   Total Protein 7.1 6.5 - 8.1 g/dL   Albumin 4.0 3.5 - 5.0 g/dL   AST 20 15 - 41 U/L   ALT 36 0 - 44 U/L   Alkaline Phosphatase 136 (H) 38 - 126 U/L   Total Bilirubin 0.5 <1.2 mg/dL   GFR, Estimated >04 >54 mL/min    Comment: (NOTE) Calculated using the CKD-EPI Creatinine Equation (2021)    Anion gap 11 5 - 15    Comment: Performed at [facility], [address] [place], [place] [address]  Urinalysis, w/ Reflex to  Culture (Infection Suspected) -Urine, Clean Catch     Status: Abnormal   Collection Time: 06/02/23  4:13 AM  Result Value Ref Range   Specimen Source URINE, CLEAN CATCH    Color, Urine YELLOW YELLOW   APPearance HAZY (A) CLEAR   Specific Gravity, Urine 1.017 1.005 - 1.030   pH 5.0 5.0 - 8.0   Glucose, UA >=500 (A) NEGATIVE mg/dL   Hgb urine dipstick LARGE (A) NEGATIVE   Bilirubin Urine NEGATIVE NEGATIVE   Ketones, ur NEGATIVE NEGATIVE mg/dL   Protein, ur 30 (A) NEGATIVE mg/dL   Nitrite POSITIVE (A) NEGATIVE   Leukocytes,Ua LARGE (A) NEGATIVE   RBC / HPF >50 0 - 5 RBC/hpf   WBC, UA >50 0 - 5 WBC/hpf    Comment:        Reflex urine culture not performed if WBC <=10, OR if Squamous epithelial cells >5. If Squamous epithelial cells >5 suggest recollection.    Bacteria, UA RARE (A) NONE SEEN   Squamous Epithelial / HPF 0-5 0 - 5 /HPF   Mucus PRESENT     Comment: Performed at [facility], [address], [place], [place] [address]  Urine Culture     Status: Abnormal   Collection Time: 06/02/23  4:13 AM   Specimen: Urine, Random  Result Value Ref Range   Specimen Description      URINE, RANDOM Performed at [facility], [address], [place], [place] [address]    Special Requests      NONE Reflexed from [id number] Performed at [facility], [address], [place], [place] [address]    Culture >=100,000 COLONIES/mL ESCHERICHIA COLI (A)    Report Status 06/04/2023 FINAL    Organism ID, Bacteria ESCHERICHIA COLI (A)       Susceptibility   Escherichia coli - MIC*    AMPICILLIN >=32 RESISTANT Resistant     CEFAZOLIN <=4 SENSITIVE Sensitive     CEFEPIME <=0.12 SENSITIVE Sensitive     CEFTRIAXONE <=0.25 SENSITIVE Sensitive     CIPROFLOXACIN <=0.25 SENSITIVE Sensitive     GENTAMICIN <=1 SENSITIVE Sensitive     IMIPENEM <=0.25 SENSITIVE Sensitive     NITROFURANTOIN <=16 SENSITIVE Sensitive     TRIMETH/SULFA <=20 SENSITIVE Sensitive     AMPICILLIN/SULBACTAM 16 INTERMEDIATE  Intermediate     PIP/TAZO <=4 SENSITIVE Sensitive ug/mL    * >=100,000 COLONIES/mL ESCHERICHIA COLI  UA/M w/rflx Culture, Routine     Status: Abnormal   Collection Time: 06/19/23  3:22 PM   Specimen: Urine  Result Value Ref Range   Specific Gravity, UA      >=1.030 (A) 1.005 - 1.030   pH, UA 5.5 5.0 -  7.5   Color, UA Yellow Yellow   Appearance Ur Clear Clear   Leukocytes,UA Negative Negative   Protein,UA Trace Negative/Trace   Glucose, UA 2+ (A) Negative   Ketones, UA Negative Negative   RBC, UA Negative Negative   Bilirubin, UA Negative Negative   Urobilinogen, Ur 0.2 0.2 - 1.0 mg/dL   Nitrite, UA Negative Negative   Microscopic Examination Comment     Comment: Microscopic follows if indicated.   Microscopic Examination See below:     Comment: Microscopic was indicated and was performed.   Urinalysis Reflex Comment     Comment: This specimen will not reflex to a Urine Culture.  Microscopic Examination     Status: None   Collection Time: 06/19/23  3:22 PM  Result Value Ref Range   WBC, UA 0-5 0 - 5 /hpf   RBC, Urine 0-2 0 - 2 /hpf   Epithelial Cells (non renal) 0-10 0 - 10 /hpf   Casts None seen None seen /lpf   Bacteria, UA None seen None seen/Few  Comprehensive metabolic panel     Status: Abnormal   Collection Time: 07/02/23  8:05 AM  Result Value Ref Range   Glucose 140 (H) 70 - 99 mg/dL   BUN 10 8 - 27 mg/dL   Creatinine, Ser 8.46 0.57 - 1.00 mg/dL   eGFR 94 >96 EX/BMW/4.13   BUN/Creatinine Ratio 14 12 - 28   Sodium 141 134 - 144 mmol/L   Potassium 4.1 3.5 - 5.2 mmol/L   Chloride 103 96 - 106 mmol/L   CO2 24 20 - 29 mmol/L   Calcium 9.4 8.7 - 10.3 mg/dL   Total Protein 6.6 6.0 - 8.5 g/dL   Albumin 4.1 3.9 - 4.9 g/dL   Globulin, Total 2.5 1.5 - 4.5 g/dL   Bilirubin Total 0.2 0.0 - 1.2 mg/dL   Alkaline Phosphatase 78 44 - 121 IU/L   AST 24 0 - 40 IU/L   ALT 35 (H) 0 - 32 IU/L  Lipid panel     Status: None   Collection Time: 07/02/23  8:05 AM  Result Value Ref  Range   Cholesterol, Total 125 100 - 199 mg/dL   Triglycerides 74 0 - 149 mg/dL   HDL 51 >24 mg/dL   VLDL Cholesterol Cal 15 5 - 40 mg/dL   LDL Chol Calc (NIH) 59 0 - 99 mg/dL   Chol/HDL Ratio 2.5 0.0 - 4.4 ratio    Comment:                                   T. Chol/HDL Ratio                                             Men  Women                               1/2 Avg.Risk  3.4    3.3                                   Avg.Risk  5.0    4.4  2X Avg.Risk  9.6    7.1                                3X Avg.Risk 23.4   11.0   TSH     Status: None   Collection Time: 07/02/23  8:05 AM  Result Value Ref Range   TSH 2.300 0.450 - 4.500 uIU/mL  T4, free     Status: None   Collection Time: 07/02/23  8:05 AM  Result Value Ref Range   Free T4 1.21 0.82 - 1.77 ng/dL  HgB Z6X     Status: Abnormal   Collection Time: 07/16/23  8:24 AM  Result Value Ref Range   Hemoglobin A1C 9.5 (A) 4.0 - 5.6 %   HbA1c POC (<> result, manual entry)     HbA1c, POC (prediabetic range)     HbA1c, POC (controlled diabetic range)         Assessment & Plan:   1) Uncontrolled type 2 diabetes mellitus with complication, with long-term current use of insulin (HCC)  She presents today with her CGM showing above target glycemic profile overall.  Her POCT A1c today 9.5%, increasing from last visit of 8.5%.  Analysis of her CGM shows TIR 20%, TAR 80%, TBR 0%.  She has been off her Ozempic for about 6 weeks, was going through PT and did not want to feel sick during that. She has plans to restart this soon.  -Her diabetes is complicated by history of noncompliance (she recently showed better engagement) and patient remains at extremely high risk for more acute and chronic complications of diabetes which include CAD, CVA, CKD, retinopathy, and neuropathy. These are all discussed in detail with the patient.  - Nutritional counseling repeated at each appointment due to patients tendency to fall back  in to old habits.  - The patient admits there is a room for improvement in their diet and drink choices. -  Suggestion is made for the patient to avoid simple carbohydrates from their diet including Cakes, Sweet Desserts / Pastries, Ice Cream, Soda (diet and regular), Sweet Tea, Candies, Chips, Cookies, Sweet Pastries, Store Bought Juices, Alcohol in Excess of 1-2 drinks a day, Artificial Sweeteners, Coffee Creamer, and "Sugar-free" Products. This will help patient to have stable blood glucose profile and potentially avoid unintended weight gain.   - I encouraged the patient to switch to unprocessed or minimally processed complex starch and increased protein intake (animal or plant source), fruits, and vegetables.   - Patient is advised to stick to a routine mealtimes to eat 3 meals a day and avoid unnecessary snacks (to snack only to correct hypoglycemia).  - I have approached patient with the following individualized plan to manage diabetes and patient agrees.  -She will continue to require intensive treatment with  basal/bolus insulin in order for her to achieve and maintain control of diabetes to target.  - She is advised to continue her Lantus 70 units SQ nightly, Novolog 20-26 units TID with meals if glucose is above 90 and she is eating (Specific instructions on how to titrate insulin dosage based on glucose readings given to patient in writing), Metformin 500 mg ER twice daily with meals and restart her Ozempic at lower dose of 0.25 mg SQ weekly x 2 weeks (18 clicks), then increase to 0.5 mg (37 clicks) SQ weekly thereafter.  Plan will be to gradually increase her back to the 1 mg  SQ weekly.    -She is encouraged to continue monitoring blood glucose 4 times daily-using her CGM, before meals and before bed, and to call the clinic if she has readings less than 70 or greater than 300 for 3 tests in a row.  She has benefited from CGM device and is advised to continue.  2) Lipids/HPL:  Her most  recent lipid panel from 07/02/23 shows controlled LDL of 59.  She was put back on Simvastatin by her PCP.  Side effects and precautions discussed with her.    3) Hypertension:  Her blood pressure is controlled to target.  She is advised to continue medications as prescribed by PCP.  4) Hypothyroidism:  -Her previsit TFTs are consistent with appropriate thyroid hormone replacement.  She is advised to continue her current dose of Levothyroxine 75 mcg po daily before breakfast (skipping 2 days out of the week).     - We discussed about the correct intake of her thyroid hormone, on empty stomach at fasting, with water, separated by at least 30 minutes from breakfast and other medications,  and separated by more than 4 hours from calcium, iron, multivitamins, acid reflux medications (PPIs). -Patient is made aware of the fact that thyroid hormone replacement is needed for life, dose to be adjusted by periodic monitoring of thyroid function tests.    - I advised patient to maintain close follow up with Anabel Halon, MD for primary care needs.      I spent  35  minutes in the care of the patient today including review of labs from CMP, Lipids, Thyroid Function, Hematology (current and previous including abstractions from other facilities); face-to-face time discussing  her blood glucose readings/logs, discussing hypoglycemia and hyperglycemia episodes and symptoms, medications doses, her options of short and long term treatment based on the latest standards of care / guidelines;  discussion about incorporating lifestyle medicine;  and documenting the encounter. Risk reduction counseling performed per USPSTF guidelines to reduce obesity and cardiovascular risk factors.     Please refer to Patient Instructions for Blood Glucose Monitoring and Insulin/Medications Dosing Guide"  in media tab for additional information. Please  also refer to " Patient Self Inventory" in the Media  tab for reviewed elements  of pertinent patient history.  Murray Hodgkins participated in the discussions, expressed understanding, and voiced agreement with the above plans.  All questions were answered to her satisfaction. she is encouraged to contact clinic should she have any questions or concerns prior to her return visit.    Follow up plan: -Return in about 4 months (around 11/13/2023) for Diabetes F/U with A1c in office, No previsit labs, Bring meter and logs.  Ronny Bacon, [facility] [address] [place], [place] [address] Phone: [phone] Fax: [phone]   07/16/2023, 8:34 AM

## 2023-07-18 ENCOUNTER — Encounter (HOSPITAL_COMMUNITY): Payer: Medicaid Other

## 2023-07-18 DIAGNOSIS — M5416 Radiculopathy, lumbar region: Secondary | ICD-10-CM | POA: Diagnosis not present

## 2023-07-25 ENCOUNTER — Encounter (HOSPITAL_COMMUNITY): Payer: Medicaid Other | Admitting: Physical Therapy

## 2023-07-25 ENCOUNTER — Ambulatory Visit (HOSPITAL_COMMUNITY): Payer: Medicaid Other | Admitting: Physical Therapy

## 2023-07-25 DIAGNOSIS — R262 Difficulty in walking, not elsewhere classified: Secondary | ICD-10-CM

## 2023-07-25 DIAGNOSIS — M6281 Muscle weakness (generalized): Secondary | ICD-10-CM

## 2023-07-25 DIAGNOSIS — M7062 Trochanteric bursitis, left hip: Secondary | ICD-10-CM | POA: Diagnosis not present

## 2023-07-25 NOTE — Therapy (Signed)
[facility] OUTPATIENT PHYSICAL THERAPY TREATMENT (THORACOLUMBAR)   Patient Name: Sandy Valencia MRN: [id number] DOB:22-Jul-1960, 63 y.o., female Today's Date: 07/25/2023  END OF SESSION:   PT End of Session - 07/25/23 0947     Visit Number 4    Number of Visits 6    Date for PT Re-Evaluation 08/09/23    Authorization Type [place] Medicaid Healthy Blue    Authorization Time Period healthy blue approved 9 visits from 06/28/2023-08/26/2023    Authorization - Number of Visits 9    Progress Note Due on Visit 6    PT Start Time 0935    PT Stop Time 1018    PT Time Calculation (min) 43 min    Activity Tolerance Patient tolerated treatment well    Behavior During Therapy [facility] for tasks assessed/performed                  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   Anaphylactic shock, unspecified, subsequent encounter    Anemia, iron deficiency 02/14/2015   patient is not aware this dx   Angio-edema    Arthritis    Asthma    Mild intermittent asthma   Asymptomatic varicose veins of right lower extremity    Chronic obstructive pulmonary disease with (acute) exacerbation (HCC)    patient denies this dx   Chronic rhinitis 05/21/2018   COVID-19 2019   Diabetes mellitus    type 2   Essential (primary) hypertension    Family history of colon cancer 09/03/2012   Fatty liver    Gastro-esophageal reflux disease without esophagitis    GERD (gastroesophageal reflux disease)    Hearing loss    wears hearing aid   History of colonic polyps 08/26/2017   Hypercholesteremia    Hypertension    Hypothyroidism    Low back pain    and left leg pain   Mass of finger, right s/p surgical excision (mucoid cyst) 04/17/18 04/22/2018   Mucous cyst of digit of right hand    Obesity, unspecified    Other adverse food reactions, not elsewhere classified, initial encounter    Personal history of noncompliance with  medical treatment, presenting hazards to health 05/18/2015   Rash and other nonspecific skin eruption    Restless legs syndrome    patient denies this dx   Sleep apnea    pt siad, "i had small amount but could not tolerate CPAP and they said it was ok since it was mild.   Unspecified asthma, uncomplicated    Viral URI 07/13/2022   Zoster without complications    Past Surgical History:  Procedure Laterality Date   ABDOMINAL HYSTERECTOMY     BACK SURGERY  11/26/2022   BIOPSY  11/08/2022   Procedure: BIOPSY;  Surgeon: Corbin Ade, MD;  [facility];  Service: Endoscopy;;   CATARACT EXTRACTION W/PHACO Left 03/09/2022   Procedure: CATARACT EXTRACTION PHACO AND INTRAOCULAR LENS PLACEMENT (IOC);  Surgeon: Fabio Pierce, MD;  Location: [facility];  Service: Ophthalmology;  Laterality: Left;  CDE 7.89   CATARACT EXTRACTION W/PHACO Right 03/23/2022   Procedure: CATARACT EXTRACTION PHACO AND INTRAOCULAR LENS PLACEMENT (IOC);  Surgeon: Fabio Pierce, MD;  Location: [facility];  Service: Ophthalmology;  Laterality: Right;  CDE: 2.63   CHOLECYSTECTOMY     COLONOSCOPY N/A 09/22/2012   RMR: colonic polyps -removed as described above. tubular adenoma, next TCS 09/2017  COLONOSCOPY WITH PROPOFOL N/A 09/23/2017   Surgeon: Corbin Ade, MD; internal hemorrhoids, three 5-6 mm polyps removed, diverticulosis in sigmoid and descending colon.  Pathology with tubular adenomas and rectal hyperplastic polyp.  Recommended 5 year surveillance.   COLONOSCOPY WITH PROPOFOL N/A 11/08/2022   Procedure: COLONOSCOPY WITH PROPOFOL;  Surgeon: Corbin Ade, MD;  [facility];  Service: Endoscopy;  Laterality: N/A;  815am, asa 3   CYST EXCISION Left 07/19/2016   Procedure: CYST REMOVAL LEFT RING FINGER;  Surgeon: Vickki Hearing, MD;  Location: [facility];  Service: Orthopedics;  Laterality: Left;   CYST REMOVAL LEG Right    foot   ESOPHAGOGASTRODUODENOSCOPY N/A 10/21/2014   RMR: Small hiatal  hernia; otherwise normal EGD status post passage of a Maloney dilator.    ESOPHAGOGASTRODUODENOSCOPY (EGD) WITH PROPOFOL N/A 11/08/2022   Procedure: ESOPHAGOGASTRODUODENOSCOPY (EGD) WITH PROPOFOL;  Surgeon: Corbin Ade, MD;  [facility];  Service: Endoscopy;  Laterality: N/A;   EXCISION MASS UPPER EXTREMETIES Right 04/17/2018   Procedure: EXCISION MASS UPPER EXTREMETIES right ring finger;  Surgeon: Vickki Hearing, MD;  Location: [facility];  Service: Orthopedics;  Laterality: Right;   LUMBAR LAMINECTOMY/DECOMPRESSION MICRODISCECTOMY Bilateral 11/26/2022   Procedure: Microdiscectomy - bilateral - Lumbar four-Lumbar five;  Surgeon: Julio Sicks, MD;  Location: [facility];  Service: Neurosurgery;  Laterality: Bilateral;   MALONEY DILATION N/A 10/21/2014   Procedure: Elease Hashimoto DILATION;  Surgeon: Corbin Ade, MD;  [facility];  Service: Endoscopy;  Laterality: N/A;   MALONEY DILATION N/A 11/08/2022   Procedure: Elease Hashimoto DILATION;  Surgeon: Corbin Ade, MD;  [facility];  Service: Endoscopy;  Laterality: N/A;   MIDDLE EAR SURGERY Right    patched hole in ear drum   POLYPECTOMY  09/23/2017   Procedure: POLYPECTOMY;  Surgeon: Corbin Ade, MD;  [facility];  Service: Endoscopy;;  polyp hepatic flexure polyp cs, splenic flexure polyp cs, rectal polyp cs   POLYPECTOMY  11/08/2022   Procedure: POLYPECTOMY;  Surgeon: Corbin Ade, MD;  [facility];  Service: Endoscopy;;   TOTAL ABDOMINAL HYSTERECTOMY     Patient Active Problem List   Diagnosis Date Noted   Acute cystitis without hematuria 06/19/2023   Acute bronchitis 05/24/2023   Chronic mastoiditis of right side 03/24/2023   Varicose veins of right lower extremity 01/22/2023   Acute pain of left lower extremity 10/25/2022   Primary insomnia 09/18/2022   Encounter for examination following treatment at hospital 09/18/2022   Acute non-recurrent frontal sinusitis 07/20/2022    Lumbar radiculopathy 03/08/2022   Statin intolerance 01/19/2022   Encounter for general adult medical examination with abnormal findings 01/16/2022   Lumbar spondylosis 09/05/2021   Allergic rhinitis due to allergen 04/17/2021   Seborrheic keratosis 04/17/2021   Primary osteoarthritis involving multiple joints 12/02/2019   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   Long-term current use of opiate analgesic 09/08/2019   Neck pain 09/07/2019   Mild intermittent asthma without complication 05/21/2018   Gastroesophageal reflux disease 05/21/2018   Non-allergic rhinitis 05/21/2018   History of colonic polyps 08/26/2017   Hypothyroidism 04/22/2017   Mixed hyperlipidemia 12/31/2016   Type 2 diabetes mellitus with hyperglycemia (HCC) 05/18/2015   Essential hypertension 05/18/2015   Morbid obesity (HCC) 05/18/2015   Fatty liver 02/14/2015   Hiatal hernia    Dysphagia, pharyngoesophageal phase    Dysphagia 10/11/2014    PCP: Anabel Halon, MDPCP - General  REFERRING PROVIDER:   Floreen Comber, NP    REFERRING DIAG:  Diagnosis  M54.16 (ICD-10-CM) - Radiculopathy, lumbar region    Rationale for Evaluation and Treatment: Rehabilitation  THERAPY DIAG:  Difficulty in walking, not elsewhere classified  Trochanteric bursitis of left hip  Muscle weakness (generalized)  ONSET DATE: May 18 2023    SUBJECTIVE:                                                                                                                                                                                           SUBJECTIVE STATEMENT:  Pt states that she was very sore last time.  Just achy but no real pain.  PERTINENT HISTORY:  Microdiscectomy 11/26/2022    PAIN:  Are you having pain? No  PRECAUTIONS: None  RED FLAGS: None   WEIGHT BEARING RESTRICTIONS: No  FALLS:  Has patient fallen in last 6 months? No  LIVING ENVIRONMENT: Lives with: lives alone Lives in:  House/apartment Stairs: No Has following equipment at home: Single point cane   OCCUPATION: CNA  PLOF: Independent   PATIENT GOALS: To walk pain free   NEXT MD VISIT: Feb 2025  07/17/23     OBJECTIVE:   DIAGNOSTIC FINDINGS:  IMPRESSION: 1. Uncomplicated interval L4-5 micro-discectomy with resolved neural compression. 2. Unchanged L3-4 left foraminal impingement due to herniation and asymmetric facet spurring. 3. No new herniation or affected level.    PATIENT SURVEYS:  LEFS 30/80  SCREENING FOR RED FLAGS: Bowel or bladder incontinence: No Spinal tumors: No Cauda equina syndrome: No Compression fracture: No Abdominal aneurysm: No  COGNITION: Overall cognitive status: Within functional limits for tasks assessed  POSTURE: flexed trunk  and weight shift right      FUNCTIONAL TESTS:  5 times sit to stand: 07/04/23: 8.04" no HHA   GAIT ANALYSIS: Distance walked: 23ft Assistive device utilized: None Level of assistance: Complete Independence Comments: Patient with moderate antalgia left lower extremity; decreased hip flexion in swing phase  SENSATION: WFL      LOWER EXTREMITY ROM:  MMT Right eval Left eval  Hip flexion  0-90 pain  Hip extension    Hip abduction    Hip adduction    Hip internal rotation  0-15 pain  Hip external rotation  0-25 pain  Knee flexion    Knee extension    Ankle dorsiflexion    Ankle plantarflexion    Ankle inversion    Ankle eversion     (Blank rows = not tested)      LOWER EXTREMITY MMT:     Active  Right eval Left eval  Hip flexion  3-/5  Hip extension    Hip abduction    Hip adduction    Hip internal rotation  3-/5  Hip external rotation  3-/5  Knee flexion    Knee extension    Ankle dorsiflexion    Ankle plantarflexion    Ankle inversion    Ankle eversion     (Blank rows = not tested) PALPATION: Moderate tenderness to palpation left trochanteric bursae,  Moderate tenderness to palpation left  glut medius    TODAY'S TREATMENT:                                                                                                                              DATE:  07/25/23 Wall arch x 10 IT band stretch 3 x 30" Sit to stands 15X Stand against wall UE flexion 15X Shoulder retraction against wall 15X Chest stretch in doorway 3X20" Standing no UE lunges 15X onto 4" step Standing 1 UE alternating marches 15X Standing forward step ups 4" 15X each SLS 4-5" max each LE without UE assist Vectors 10X5" holds each LE with 1 UE assist  07/09/23 Heel raise x 10 Squat x 10 Step up x 10 B Side step with red theraband x 10  Single leg stance B   x 5 reps each  ITband stretch 15"x 3  Marching x 10  Back against wall shoulder retraction x 10 UE flexion x 5 Nustep level 2 x minutes     07/04/23:  Reviewed goals Educated importance of HEP  5 STS no HHA 8.04" Sidelying:  Clam with GTB 10x 5" - good form and mechanics   Reverse clam with GTB 10x 5"   Abduction 10x Supine:  Bridge with GTB around thigh 2x 10  SKTC 2x 30  LTR 5x 10"  Marching with ab set 10x 5"  Standing: 3D hip excursion (lateral weight shift with UE overhead 5 reps, rotation 5reps , STS 10x eccentric control no HHA, lumbar extension 5x)  06/28/23 PT IE HEP- see below   PATIENT EDUCATION:  Education details: HEP, pain management using cane Person educated: Patient Education method: Explanation and Demonstration Education comprehension: verbalized understanding  HOME EXERCISE PROGRAM: Access Code: [email or web address] URL: [email or web address] Date: 06/28/2023 Prepared by: Seymour Bars  Exercises - Clamshell with Resistance  - 1-2 x daily - 2 sets - 10 reps - Sidelying Reverse Clamshell with Resistance  - 1-2 x daily - 2 sets - 10 reps  Patient Education - Reciprocal Gait with Cane  Access Code: [email or web address] URL: [email or web address] Date: 07/04/2023 Prepared by: Becky Sax  Exercises - Bridge with Resistance  - 1-2 x daily - 7 x weekly - 2 sets - 10 reps - 5" hold - Supine March  - 1 x daily - 7 x weekly - 3 sets - 10 reps - Sit to Stand  - 1 x daily - 7 x weekly - 2 sets - 10 reps  Patient Education - Reciprocal Gait  with Gilmer Mor 07/11/23 - Heel Raises with Counter Support  - 1 x daily - 7 x weekly - 1 sets - 10 reps - 3-5" hold - Side Stepping with Resistance at Ankles  - 1 x daily - 7 x weekly - 1 sets - 2 reps - Mini Squat  - 1 x daily - 7 x weekly - 1 sets - 10 reps - 3-5" hold Tensor stretch   ASSESSMENT:  CLINICAL IMPRESSION: Continued with focus on improving LE strength and stability while improving posture and overall functioning.  Pt able to complete exercises with minimal cues for form.  Cues to increase hold times.  Noted tightness in chest musculature with doorway stretch added.  Unable to maintain single leg stance greater than 5 seconds on either LE with added vectors this session to help improve this.  No pain or issues with exercises completed today.  No rest breaks needed or noted fatigue.  The patient will benefit from PT to address the limitations/impairments listed below to return to their prior level of function in the domains of activity and participation.    PERSONAL FACTORS:  n/a  are also affecting patient's functional outcome.   REHAB POTENTIAL: Good  CLINICAL DECISION MAKING: Stable/uncomplicated  EVALUATION COMPLEXITY: Low    GOALS: Goals reviewed with patient? No  SHORT TERM GOALS: Target date: 3 sessions   1. Patient will be independent with a basic stretching/strengthening HEP  Baseline:  Goal status:on-going    LONG TERM GOALS: Target date: 6 sessions   Patient will be able to score a >/= 40/80 on the LEFS    to demonstrate an improvement in overall housework, ADL completion, mobility, and self-care.Baseline:  Goal status: on-going   2.   Patient will complete the 5 times sit to stand:    within 15  seconds with no pain  to demonstrate an improvement lower extremity strength needed for home and community ambulation  Baseline: 07/04/23: 5 STS 8.04" Goal status: MET  3.  Patient will be independent with a comprehensive strengthening HEP  Baseline:  Goal status: on-going  4. Patient will be able to demo left hip flexion active range of motion 0-100 with no pain to facilitate squatting, bending  Baseline:  Goal status: on-going    PLAN:  PT FREQUENCY: 1x/week  PT DURATION:  6 sessions  PLANNED INTERVENTIONS: 97110-Therapeutic exercises, 97530- Therapeutic activity, 97112- Neuromuscular re-education, 97535- Self Care, [id number]- Manual therapy, [phone]- Gait training, Patient/Family education, Balance training, Stair training, Dry Needling, Joint mobilization, Joint manipulation, Spinal manipulation, Spinal mobilization, Cryotherapy, and Moist heat.  PLAN FOR NEXT SESSION:  Progress hip strengthening/ active range of motion for HEP.  Next session give HEP for single leg raise, vector stance (3 way kick) and narrow base of support with eyes closed.    Lurena Nida, PTA/CLT [facility] Ph: [phone]  [phone] 07/25/2023, 10:38 AM

## 2023-07-25 NOTE — Therapy (Deleted)
[facility] OUTPATIENT PHYSICAL THERAPY TREATMENT (THORACOLUMBAR)   Patient Name: Sandy Valencia MRN: [id number] DOB:02-11-1961, 63 y.o., female Today's Date: 07/25/2023  END OF SESSION:         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   Anaphylactic shock, unspecified, subsequent encounter    Anemia, iron deficiency 02/14/2015   patient is not aware this dx   Angio-edema    Arthritis    Asthma    Mild intermittent asthma   Asymptomatic varicose veins of right lower extremity    Chronic obstructive pulmonary disease with (acute) exacerbation (HCC)    patient denies this dx   Chronic rhinitis 05/21/2018   COVID-19 2019   Diabetes mellitus    type 2   Essential (primary) hypertension    Family history of colon cancer 09/03/2012   Fatty liver    Gastro-esophageal reflux disease without esophagitis    GERD (gastroesophageal reflux disease)    Hearing loss    wears hearing aid   History of colonic polyps 08/26/2017   Hypercholesteremia    Hypertension    Hypothyroidism    Low back pain    and left leg pain   Mass of finger, right s/p surgical excision (mucoid cyst) 04/17/18 04/22/2018   Mucous cyst of digit of right hand    Obesity, unspecified    Other adverse food reactions, not elsewhere classified, initial encounter    Personal history of noncompliance with medical treatment, presenting hazards to health 05/18/2015   Rash and other nonspecific skin eruption    Restless legs syndrome    patient denies this dx   Sleep apnea    pt siad, "i had small amount but could not tolerate CPAP and they said it was ok since it was mild.   Unspecified asthma, uncomplicated    Viral URI 07/13/2022   Zoster without complications    Past Surgical History:  Procedure Laterality Date   ABDOMINAL HYSTERECTOMY     BACK SURGERY  11/26/2022   BIOPSY  11/08/2022   Procedure: BIOPSY;  Surgeon: Corbin Ade, MD;  Location: [facility];  Service: Endoscopy;;   CATARACT EXTRACTION W/PHACO Left 03/09/2022   Procedure: CATARACT EXTRACTION PHACO AND INTRAOCULAR LENS PLACEMENT (IOC);  Surgeon: Fabio Pierce, MD;  Location: [facility];  Service: Ophthalmology;  Laterality: Left;  CDE 7.89   CATARACT EXTRACTION W/PHACO Right 03/23/2022   Procedure: CATARACT EXTRACTION PHACO AND INTRAOCULAR LENS PLACEMENT (IOC);  Surgeon: Fabio Pierce, MD;  Location: [facility];  Service: Ophthalmology;  Laterality: Right;  CDE: 2.63   CHOLECYSTECTOMY     COLONOSCOPY N/A 09/22/2012   RMR: colonic polyps -removed as described above. tubular adenoma, next TCS 09/2017   COLONOSCOPY WITH PROPOFOL N/A 09/23/2017   Surgeon: Corbin Ade, MD; internal hemorrhoids, three 5-6 mm polyps removed, diverticulosis in sigmoid and descending colon.  Pathology with tubular adenomas and rectal hyperplastic polyp.  Recommended 5 year surveillance.   COLONOSCOPY WITH PROPOFOL N/A 11/08/2022   Procedure: COLONOSCOPY WITH PROPOFOL;  Surgeon: Corbin Ade, MD;  Location: [facility];  Service: Endoscopy;  Laterality: N/A;  815am, asa 3   CYST EXCISION Left 07/19/2016   Procedure: CYST REMOVAL LEFT RING FINGER;  Surgeon: Vickki Hearing, MD;  Location: [facility];  Service: Orthopedics;  Laterality: Left;   CYST REMOVAL LEG Right    foot   ESOPHAGOGASTRODUODENOSCOPY N/A 10/21/2014  RMR: Small hiatal hernia; otherwise normal EGD status post passage of a Maloney dilator.    ESOPHAGOGASTRODUODENOSCOPY (EGD) WITH PROPOFOL N/A 11/08/2022   Procedure: ESOPHAGOGASTRODUODENOSCOPY (EGD) WITH PROPOFOL;  Surgeon: Corbin Ade, MD;  [facility]: [facility];  Service: Endoscopy;  Laterality: N/A;   EXCISION MASS UPPER EXTREMETIES Right 04/17/2018   Procedure: EXCISION MASS UPPER EXTREMETIES right ring finger;  Surgeon: Vickki Hearing, MD;  Location: [facility];  Service: Orthopedics;  Laterality: Right;   LUMBAR LAMINECTOMY/DECOMPRESSION  MICRODISCECTOMY Bilateral 11/26/2022   Procedure: Microdiscectomy - bilateral - Lumbar four-Lumbar five;  Surgeon: Julio Sicks, MD;  Location: [facility];  Service: Neurosurgery;  Laterality: Bilateral;   MALONEY DILATION N/A 10/21/2014   Procedure: Elease Hashimoto DILATION;  Surgeon: Corbin Ade, MD;  [facility]: [facility];  Service: Endoscopy;  Laterality: N/A;   MALONEY DILATION N/A 11/08/2022   Procedure: Elease Hashimoto DILATION;  Surgeon: Corbin Ade, MD;  [facility]: [facility];  Service: Endoscopy;  Laterality: N/A;   MIDDLE EAR SURGERY Right    patched hole in ear drum   POLYPECTOMY  09/23/2017   Procedure: POLYPECTOMY;  Surgeon: Corbin Ade, MD;  [facility]: [facility];  Service: Endoscopy;;  polyp hepatic flexure polyp cs, splenic flexure polyp cs, rectal polyp cs   POLYPECTOMY  11/08/2022   Procedure: POLYPECTOMY;  Surgeon: Corbin Ade, MD;  Location: [facility];  Service: Endoscopy;;   TOTAL ABDOMINAL HYSTERECTOMY     Patient Active Problem List   Diagnosis Date Noted   Acute cystitis without hematuria 06/19/2023   Acute bronchitis 05/24/2023   Chronic mastoiditis of right side 03/24/2023   Varicose veins of right lower extremity 01/22/2023   Acute pain of left lower extremity 10/25/2022   Primary insomnia 09/18/2022   Encounter for examination following treatment at hospital 09/18/2022   Acute non-recurrent frontal sinusitis 07/20/2022   Lumbar radiculopathy 03/08/2022   Statin intolerance 01/19/2022   Encounter for general adult medical examination with abnormal findings 01/16/2022   Lumbar spondylosis 09/05/2021   Allergic rhinitis due to allergen 04/17/2021   Seborrheic keratosis 04/17/2021   Primary osteoarthritis involving multiple joints 12/02/2019   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   Long-term current use of opiate analgesic 09/08/2019   Neck pain 09/07/2019   Mild intermittent asthma without complication 05/21/2018   Gastroesophageal  reflux disease 05/21/2018   Non-allergic rhinitis 05/21/2018   History of colonic polyps 08/26/2017   Hypothyroidism 04/22/2017   Mixed hyperlipidemia 12/31/2016   Type 2 diabetes mellitus with hyperglycemia (HCC) 05/18/2015   Essential hypertension 05/18/2015   Morbid obesity (HCC) 05/18/2015   Fatty liver 02/14/2015   Hiatal hernia    Dysphagia, pharyngoesophageal phase    Dysphagia 10/11/2014    PCP: Anabel Halon, MDPCP - General   REFERRING PROVIDER:   Floreen Comber, NP    REFERRING DIAG:  Diagnosis  M54.16 (ICD-10-CM) - Radiculopathy, lumbar region    Rationale for Evaluation and Treatment: Rehabilitation  THERAPY DIAG:  Difficulty in walking, not elsewhere classified  Trochanteric bursitis of left hip  Muscle weakness (generalized)  ONSET DATE: May 18 2023    SUBJECTIVE:  SUBJECTIVE STATEMENT:  Pt states that she was very sore last time.  PERTINENT HISTORY:  Microdiscectomy 11/26/2022    PAIN:  Are you having pain? Yes: NPRS scale: 3/10 Pain location: left hip- trochanter   Pain description: sharp  Aggravating factors: walking  Relieving factors: rest   PRECAUTIONS: None  RED FLAGS: None   WEIGHT BEARING RESTRICTIONS: No  FALLS:  Has patient fallen in last 6 months? No  LIVING ENVIRONMENT: Lives with: lives alone Lives in: House/apartment Stairs: No Has following equipment at home: Single point cane   OCCUPATION: CNA  PLOF: Independent   PATIENT GOALS: To walk pain free   NEXT MD VISIT: Feb 2025  07/17/23     OBJECTIVE:   DIAGNOSTIC FINDINGS:  IMPRESSION: 1. Uncomplicated interval L4-5 micro-discectomy with resolved neural compression. 2. Unchanged L3-4 left foraminal impingement due to herniation and asymmetric facet spurring. 3. No  new herniation or affected level.    PATIENT SURVEYS:  LEFS 30/80  SCREENING FOR RED FLAGS: Bowel or bladder incontinence: No Spinal tumors: No Cauda equina syndrome: No Compression fracture: No Abdominal aneurysm: No  COGNITION: Overall cognitive status: Within functional limits for tasks assessed  POSTURE: flexed trunk  and weight shift right      FUNCTIONAL TESTS:  5 times sit to stand: 07/04/23: 8.04" no HHA   GAIT ANALYSIS: Distance walked: 69ft Assistive device utilized: None Level of assistance: Complete Independence Comments: Patient with moderate antalgia left lower extremity; decreased hip flexion in swing phase  SENSATION: WFL      LOWER EXTREMITY ROM:  MMT Right eval Left eval  Hip flexion  0-90 pain  Hip extension    Hip abduction    Hip adduction    Hip internal rotation  0-15 pain  Hip external rotation  0-25 pain  Knee flexion    Knee extension    Ankle dorsiflexion    Ankle plantarflexion    Ankle inversion    Ankle eversion     (Blank rows = not tested)      LOWER EXTREMITY MMT:     Active  Right eval Left eval  Hip flexion  3-/5  Hip extension    Hip abduction    Hip adduction    Hip internal rotation  3-/5  Hip external rotation  3-/5  Knee flexion    Knee extension    Ankle dorsiflexion    Ankle plantarflexion    Ankle inversion    Ankle eversion     (Blank rows = not tested) PALPATION: Moderate tenderness to palpation left trochanteric bursae,  Moderate tenderness to palpation left glut medius    TODAY'S TREATMENT:                                                                                                                              DATE:  07/25/23 Wall arch x 10 IT band stretch 3 x 30" 07/09/23 Heel raise x 10 Squat x 10  Step up x 10 B Side step with red theraband x 10  Single leg stance B   x 5 reps each  ITband stretch 15"x 3  Marching x 10  Back against wall shoulder retraction x 10 UE flexion x  5 Nustep level 2 x minutes     07/04/23:  Reviewed goals Educated importance of HEP  5 STS no HHA 8.04" Sidelying:  Clam with GTB 10x 5" - good form and mechanics   Reverse clam with GTB 10x 5"   Abduction 10x Supine:  Bridge with GTB around thigh 2x 10  SKTC 2x 30  LTR 5x 10"  Marching with ab set 10x 5"  Standing: 3D hip excursion (lateral weight shift with UE overhead 5 reps, rotation 5reps , STS 10x eccentric control no HHA, lumbar extension 5x)  06/28/23 PT IE HEP- see below   PATIENT EDUCATION:  Education details: HEP, pain management using cane Person educated: Patient Education method: Explanation and Demonstration Education comprehension: verbalized understanding  HOME EXERCISE PROGRAM: Access Code: [email or web address] URL: [email or web address] Date: 06/28/2023 Prepared by: Seymour Bars  Exercises - Clamshell with Resistance  - 1-2 x daily - 2 sets - 10 reps - Sidelying Reverse Clamshell with Resistance  - 1-2 x daily - 2 sets - 10 reps  Patient Education - Reciprocal Gait with Cane  Access Code: [email or web address] URL: [email or web address] Date: 07/04/2023 Prepared by: Becky Sax  Exercises - Bridge with Resistance  - 1-2 x daily - 7 x weekly - 2 sets - 10 reps - 5" hold - Supine March  - 1 x daily - 7 x weekly - 3 sets - 10 reps - Sit to Stand  - 1 x daily - 7 x weekly - 2 sets - 10 reps  Patient Education - Reciprocal Gait with Gilmer Mor 07/11/23 - Heel Raises with Counter Support  - 1 x daily - 7 x weekly - 1 sets - 10 reps - 3-5" hold - Side Stepping with Resistance at Ankles  - 1 x daily - 7 x weekly - 1 sets - 2 reps - Mini Squat  - 1 x daily - 7 x weekly - 1 sets - 10 reps - 3-5" hold Tensor stretch   ASSESSMENT:  CLINICAL IMPRESSION: Progressed HEP to standing activity.  Pt demonstrates good technique although exercises are fatigues pt.    Pt tolerated well to new exercises with additional exercises added to HEP.  Pt vocalizes  LE  Eval: Patient is a 63 y.o. y.o. female who was seen today for physical therapy evaluation and treatment for difficulty walking, left hip bursitis, weakness. Patient presents to PT with the following objective impairments: Abnormal gait, decreased activity tolerance, difficulty walking, decreased ROM, decreased strength, improper body mechanics, postural dysfunction, obesity, and pain. These impairments limit the patient in activities such as carrying, lifting, bending, standing, squatting, stairs, and locomotion level. These impairments also limit the patient in participation such as meal prep, cleaning, laundry, driving, shopping, community activity, occupation, and yard work. The patient will benefit from PT to address the limitations/impairments listed below to return to their prior level of function in the domains of activity and participation.    PERSONAL FACTORS:  n/a  are also affecting patient's functional outcome.   REHAB POTENTIAL: Good  CLINICAL DECISION MAKING: Stable/uncomplicated  EVALUATION COMPLEXITY: Low    GOALS: Goals reviewed with patient? No  SHORT TERM GOALS: Target date: 3 sessions   1. Patient will be independent with a basic stretching/strengthening HEP  Baseline:  Goal status:on-going    LONG TERM GOALS: Target date: 6 sessions   Patient will be able to score a >/= 40/80 on the LEFS    to demonstrate an improvement in overall housework, ADL completion, mobility, and self-care.Baseline:  Goal status: on-going   2.   Patient will complete the 5 times sit to stand:    within 15 seconds with no pain  to demonstrate an improvement lower extremity strength needed for home and community ambulation  Baseline: 07/04/23: 5 STS 8.04" Goal status: MET  3.  Patient will be independent with a comprehensive strengthening HEP  Baseline:  Goal status: on-going  4. Patient will be able to demo left hip flexion active range of motion 0-100 with no pain to facilitate  squatting, bending  Baseline:  Goal status: on-going    PLAN:  PT FREQUENCY: 1x/week  PT DURATION:  6 sessions  PLANNED INTERVENTIONS: 97110-Therapeutic exercises, 97530- Therapeutic activity, 97112- Neuromuscular re-education, 97535- Self Care, [id number]- Manual therapy, [phone]- Gait training, Patient/Family education, Balance training, Stair training, Dry Needling, Joint mobilization, Joint manipulation, Spinal manipulation, Spinal mobilization, Cryotherapy, and Moist heat.  PLAN FOR NEXT SESSION: Progress hip strengthening/ active range of motion for HEP; give HEP for single leg raise and narrow base of support with eyes closed.    Virgina Organ, PT CLT [phone] 07/25/2023, 9:34 AM

## 2023-07-26 ENCOUNTER — Encounter: Payer: Self-pay | Admitting: Internal Medicine

## 2023-08-01 ENCOUNTER — Ambulatory Visit (HOSPITAL_COMMUNITY): Payer: Medicaid Other | Admitting: Physical Therapy

## 2023-08-01 DIAGNOSIS — R262 Difficulty in walking, not elsewhere classified: Secondary | ICD-10-CM

## 2023-08-01 DIAGNOSIS — M7062 Trochanteric bursitis, left hip: Secondary | ICD-10-CM | POA: Diagnosis not present

## 2023-08-01 DIAGNOSIS — M6281 Muscle weakness (generalized): Secondary | ICD-10-CM | POA: Diagnosis not present

## 2023-08-01 NOTE — Therapy (Signed)
 [facility] OUTPATIENT PHYSICAL THERAPY TREATMENT (THORACOLUMBAR)   Patient Name: Sandy Valencia MRN: [id number] DOB:[birth year], 63 y.o., female Today's Date: 08/01/2023  END OF SESSION:   PT End of Session - 08/01/23 1218     Visit Number 5    Number of Visits 6    Date for PT Re-Evaluation 08/09/23    Authorization Type [place] Medicaid Healthy [employer]    Authorization Time Period healthy blue approved 9 visits from 06/28/2023-08/26/2023    Authorization - Visit Number 4    Authorization - Number of Visits 9    Progress Note Due on Visit 6    PT Start Time 1142    PT Stop Time 1220    PT Time Calculation (min) 38 min    Activity Tolerance Patient tolerated treatment well    Behavior During Therapy WFL for tasks assessed/performed                   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   Anaphylactic shock, unspecified, subsequent encounter    Anemia, iron deficiency 02/14/2015   patient is not aware this dx   Angio-edema    Arthritis    Asthma    Mild intermittent asthma   Asymptomatic varicose veins of right lower extremity    Chronic obstructive pulmonary disease with (acute) exacerbation (HCC)    patient denies this dx   Chronic rhinitis 05/21/2018   COVID-19 2019   Diabetes mellitus    type 2   Essential (primary) hypertension    Family history of colon cancer 09/03/2012   Fatty liver    Gastro-esophageal reflux disease without esophagitis    GERD (gastroesophageal reflux disease)    Hearing loss    wears hearing aid   History of colonic polyps 08/26/2017   Hypercholesteremia    Hypertension    Hypothyroidism    Low back pain    and left leg pain   Mass of finger, right s/p surgical excision (mucoid cyst) 04/17/18 04/22/2018   Mucous cyst of digit of right hand    Obesity, unspecified    Other adverse food reactions, not elsewhere classified, initial encounter     Personal history of noncompliance with medical treatment, presenting hazards to health 05/18/2015   Rash and other nonspecific skin eruption    Restless legs syndrome    patient denies this dx   Sleep apnea    pt siad, "i had small amount but could not tolerate CPAP and they said it was ok since it was mild.   Unspecified asthma, uncomplicated    Viral URI 07/13/2022   Zoster without complications    Past Surgical History:  Procedure Laterality Date   ABDOMINAL HYSTERECTOMY     BACK SURGERY  11/26/2022   BIOPSY  11/08/2022   Procedure: BIOPSY;  Surgeon: Corbin Ade, MD;  [facility]: [facility];  Service: Endoscopy;;   CATARACT EXTRACTION W/PHACO Left 03/09/2022   Procedure: CATARACT EXTRACTION PHACO AND INTRAOCULAR LENS PLACEMENT (IOC);  Surgeon: Fabio Pierce, MD;  Location: [facility];  Service: Ophthalmology;  Laterality: Left;  CDE 7.89   CATARACT EXTRACTION W/PHACO Right 03/23/2022   Procedure: CATARACT EXTRACTION PHACO AND INTRAOCULAR LENS PLACEMENT (IOC);  Surgeon: Fabio Pierce, MD;  Location: [facility];  Service: Ophthalmology;  Laterality: Right;  CDE: 2.63   CHOLECYSTECTOMY     COLONOSCOPY N/A 09/22/2012   RMR: colonic polyps -  removed as described above. tubular adenoma, next TCS 09/2017   COLONOSCOPY WITH PROPOFOL N/A 09/23/2017   Surgeon: Corbin Ade, MD; internal hemorrhoids, three 5-6 mm polyps removed, diverticulosis in sigmoid and descending colon.  Pathology with tubular adenomas and rectal hyperplastic polyp.  Recommended 5 year surveillance.   COLONOSCOPY WITH PROPOFOL N/A 11/08/2022   Procedure: COLONOSCOPY WITH PROPOFOL;  Surgeon: Corbin Ade, MD;  [facility]: [facility];  Service: Endoscopy;  Laterality: N/A;  815am, asa 3   CYST EXCISION Left 07/19/2016   Procedure: CYST REMOVAL LEFT RING FINGER;  Surgeon: Vickki Hearing, MD;  Location: [facility];  Service: Orthopedics;  Laterality: Left;   CYST REMOVAL LEG Right    foot   ESOPHAGOGASTRODUODENOSCOPY N/A  10/21/2014   RMR: Small hiatal hernia; otherwise normal EGD status post passage of a Maloney dilator.    ESOPHAGOGASTRODUODENOSCOPY (EGD) WITH PROPOFOL N/A 11/08/2022   Procedure: ESOPHAGOGASTRODUODENOSCOPY (EGD) WITH PROPOFOL;  Surgeon: Corbin Ade, MD;  [facility];  Service: Endoscopy;  Laterality: N/A;   EXCISION MASS UPPER EXTREMETIES Right 04/17/2018   Procedure: EXCISION MASS UPPER EXTREMETIES right ring finger;  Surgeon: Vickki Hearing, MD;  Location: [facility];  Service: Orthopedics;  Laterality: Right;   LUMBAR LAMINECTOMY/DECOMPRESSION MICRODISCECTOMY Bilateral 11/26/2022   Procedure: Microdiscectomy - bilateral - Lumbar four-Lumbar five;  Surgeon: Julio Sicks, MD;  Location[facility];  Service: Neurosurgery;  Laterality: Bilateral;   MALONEY DILATION N/A 10/21/2014   Procedure: Elease Hashimoto DILATION;  Surgeon: Corbin Ade, MD;  [facility];  Service: Endoscopy;  Laterality: N/A;   MALONEY DILATION N/A 11/08/2022   Procedure: Elease Hashimoto DILATION;  Surgeon: Corbin Ade, MD;  [facility]: [facility];  Service: Endoscopy;  Laterality: N/A;   MIDDLE EAR SURGERY Right    patched hole in ear drum   POLYPECTOMY  09/23/2017   Procedure: POLYPECTOMY;  Surgeon: Corbin Ade, MD;  [facility];  Service: Endoscopy;;  polyp hepatic flexure polyp cs, splenic flexure polyp cs, rectal polyp cs   POLYPECTOMY  11/08/2022   Procedure: POLYPECTOMY;  Surgeon: Corbin Ade, MD;  [facility]: [facility];  Service: Endoscopy;;   TOTAL ABDOMINAL HYSTERECTOMY     Patient Active Problem List   Diagnosis Date Noted   Acute cystitis without hematuria 06/19/2023   Acute bronchitis 05/24/2023   Chronic mastoiditis of right side 03/24/2023   Varicose veins of right lower extremity 01/22/2023   Acute pain of left lower extremity 10/25/2022   Primary insomnia 09/18/2022   Encounter for examination following treatment at hospital 09/18/2022   Acute non-recurrent  frontal sinusitis 07/20/2022   Lumbar radiculopathy 03/08/2022   Statin intolerance 01/19/2022   Encounter for general adult medical examination with abnormal findings 01/16/2022   Lumbar spondylosis 09/05/2021   Allergic rhinitis due to allergen 04/17/2021   Seborrheic keratosis 04/17/2021   Primary osteoarthritis involving multiple joints 12/02/2019   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   Long-term current use of opiate analgesic 09/08/2019   Neck pain 09/07/2019   Mild intermittent asthma without complication 05/21/2018   Gastroesophageal reflux disease 05/21/2018   Non-allergic rhinitis 05/21/2018   History of colonic polyps 08/26/2017   Hypothyroidism 04/22/2017   Mixed hyperlipidemia 12/31/2016   Type 2 diabetes mellitus with hyperglycemia (HCC) 05/18/2015   Essential hypertension 05/18/2015   Morbid obesity (HCC) 05/18/2015   Fatty liver 02/14/2015   Hiatal hernia    Dysphagia, pharyngoesophageal phase    Dysphagia  10/11/2014    PCP: Anabel Halon, MDPCP - General   REFERRING PROVIDER:   Floreen Comber, NP    REFERRING DIAG:  Diagnosis  M54.16 (ICD-10-CM) - Radiculopathy, lumbar region    Rationale for Evaluation and Treatment: Rehabilitation  THERAPY DIAG:  Difficulty in walking, not elsewhere classified  Trochanteric bursitis of left hip  Muscle weakness (generalized)  ONSET DATE: May 18 2023    SUBJECTIVE:                                                                                                                                                                                           SUBJECTIVE STATEMENT:  Pt states that she feels good, she can tell that her pain is decreasing.  She is completing her exercises at home.   PERTINENT HISTORY:  Microdiscectomy 11/26/2022    PAIN:  Are you having pain? No  PRECAUTIONS: None  RED FLAGS: None   WEIGHT BEARING RESTRICTIONS: No  FALLS:  Has patient fallen in last 6  months? No  LIVING ENVIRONMENT: Lives with: lives alone Lives in: House/apartment Stairs: No Has following equipment at home: Single point cane   OCCUPATION: CNA  PLOF: Independent   PATIENT GOALS: To walk pain free   NEXT MD VISIT: Feb 2025  07/17/23     OBJECTIVE:   DIAGNOSTIC FINDINGS:  IMPRESSION: 1. Uncomplicated interval L4-5 micro-discectomy with resolved neural compression. 2. Unchanged L3-4 left foraminal impingement due to herniation and asymmetric facet spurring. 3. No new herniation or affected level.    PATIENT SURVEYS:  LEFS 30/80  SCREENING FOR RED FLAGS: Bowel or bladder incontinence: No Spinal tumors: No Cauda equina syndrome: No Compression fracture: No Abdominal aneurysm: No  COGNITION: Overall cognitive status: Within functional limits for tasks assessed  POSTURE: flexed trunk  and weight shift right      FUNCTIONAL TESTS:  5 times sit to stand: 07/04/23: 8.04" no HHA   GAIT ANALYSIS: Distance walked: 7ft Assistive device utilized: None Level of assistance: Complete Independence Comments: Patient with moderate antalgia left lower extremity; decreased hip flexion in swing phase  SENSATION: WFL      LOWER EXTREMITY ROM:  MMT Right eval Left eval  Hip flexion  0-90 pain  Hip extension    Hip abduction    Hip adduction    Hip internal rotation  0-15 pain  Hip external rotation  0-25 pain  Knee flexion    Knee extension    Ankle dorsiflexion    Ankle plantarflexion    Ankle inversion    Ankle eversion     (Blank rows = not tested)  LOWER EXTREMITY MMT:     Active  Right eval Left eval  Hip flexion  3-/5  Hip extension    Hip abduction    Hip adduction    Hip internal rotation  3-/5  Hip external rotation  3-/5  Knee flexion    Knee extension    Ankle dorsiflexion    Ankle plantarflexion    Ankle inversion    Ankle eversion     (Blank rows = not tested) PALPATION: Moderate tenderness to palpation  left trochanteric bursae,  Moderate tenderness to palpation left glut medius    TODAY'S TREATMENT:                                                                                                                              DATE:  08/01/23 Wall arch x 10 IT band stretch 3 x 30" Sit to stands 15X Stand against wall UE flexion 10X Postural exercises : Scapular retraction, rows and shoulder extension x 10 each with green therabant.  Lunging onto 6" step x 10 each  Side step x  2 RT  Single leg stance x 5 each  Marching with no UE assist x 10 Side step x 3 RT  Nustep level 3 seat at 7 x 8 minutes  07/25/23 Wall arch x 10 IT band stretch 3 x 30" Sit to stands 15X Stand against wall UE flexion 15X Shoulder retraction against wall 15X Chest stretch in doorway 3X20" Standing no UE lunges 15X onto 4" step Standing 1 UE alternating marches 15X Standing forward step ups 4" 15X each SLS 4-5" max each LE without UE assist Vectors 10X5" holds each LE with 1 UE assist  PATIENT EDUCATION:  Education details: HEP, pain management using cane Person educated: Patient Education method: Explanation and Demonstration Education comprehension: verbalized understanding  HOME EXERCISE PROGRAM: Access Code: [email or web address] URL: [email or web address] Date: 06/28/2023 Prepared by: Seymour Bars  Exercises - Clamshell with Resistance  - 1-2 x daily - 2 sets - 10 reps - Sidelying Reverse Clamshell with Resistance  - 1-2 x daily - 2 sets - 10 reps  Patient Education - Reciprocal Gait with Cane  Access Code: [email or web address] URL: [email or web address] Date: 07/04/2023 Prepared by: Becky Sax  Exercises - Bridge with Resistance  - 1-2 x daily - 7 x weekly - 2 sets - 10 reps - 5" hold - Supine March  - 1 x daily - 7 x weekly - 3 sets - 10 reps - Sit to Stand  - 1 x daily - 7 x weekly - 2 sets - 10 reps  Patient Education - Reciprocal Gait with Gilmer Mor 07/11/23 - Heel Raises  with Counter Support  - 1 x daily - 7 x weekly - 1 sets - 10 reps - 3-5" hold - Side Stepping with Resistance at Ankles  - 1 x daily - 7 x weekly - 1 sets - 2 reps - Mini Squat  - 1 x daily -  7 x weekly - 1 sets - 10 reps - 3-5" hold Tensor stretch  08/01/27: Theraband postural exercises.  - Standing Single Leg Stance with Counter Support  - 1 x daily - 7 x weekly - 3 sets - 10 reps - Standing Balance in Corner with Eyes Closed  - 1 x daily - 7 x weekly - 3 sets - 10 reps - Standing 3-Way Kick  - 1 x daily - 7 x weekly - 3 sets - 10 reps  ASSESSMENT:  CLINICAL IMPRESSION: Pt pain is improving.  Pt balance has improved but still is a deficit. Pt HEP updated.  Pt progressing in goals.  Pt and therapist discussed the benefit of going to a fitness center.     The patient will benefit from PT to address the limitations/impairments listed below to return to their prior level of function in the domains of activity and participation.    PERSONAL FACTORS:  n/a  are also affecting patient's functional outcome.   REHAB POTENTIAL: Good  CLINICAL DECISION MAKING: Stable/uncomplicated  EVALUATION COMPLEXITY: Low    GOALS: Goals reviewed with patient? No  SHORT TERM GOALS: Target date: 3 sessions   1. Patient will be independent with a basic stretching/strengthening HEP  Baseline:  Goal status:on-going    LONG TERM GOALS: Target date: 6 sessions   Patient will be able to score a >/= 40/80 on the LEFS    to demonstrate an improvement in overall housework, ADL completion, mobility, and self-care.Baseline:  Goal status: on-going   2.   Patient will complete the 5 times sit to stand:    within 15 seconds with no pain  to demonstrate an improvement lower extremity strength needed for home and community ambulation  Baseline: 07/04/23: 5 STS 8.04" Goal status: MET  3.  Patient will be independent with a comprehensive strengthening HEP  Baseline:  Goal status: met  4. Patient will be able to  demo left hip flexion active range of motion 0-100 with no pain to facilitate squatting, bending  Baseline:  Goal status: met    PLAN:  PT FREQUENCY: 1x/week  PT DURATION:  6 sessions  PLANNED INTERVENTIONS: 97110-Therapeutic exercises, 97530- Therapeutic activity, 97112- Neuromuscular re-education, 97535- Self Care, [id number]- Manual therapy, [phone]- Gait training, Patient/Family education, Balance training, Stair training, Dry Needling, Joint mobilization, Joint manipulation, Spinal manipulation, Spinal mobilization, Cryotherapy, and Moist heat.  PLAN FOR NEXT SESSION:reassess [place], [place] [phone]  [phone] 08/01/2023, 12:21 PM

## 2023-08-07 ENCOUNTER — Ambulatory Visit: Payer: Medicaid Other | Admitting: Allergy & Immunology

## 2023-08-08 ENCOUNTER — Ambulatory Visit (HOSPITAL_COMMUNITY): Payer: Medicaid Other | Attending: Student | Admitting: Physical Therapy

## 2023-08-08 DIAGNOSIS — R262 Difficulty in walking, not elsewhere classified: Secondary | ICD-10-CM | POA: Diagnosis not present

## 2023-08-08 DIAGNOSIS — M6281 Muscle weakness (generalized): Secondary | ICD-10-CM | POA: Insufficient documentation

## 2023-08-08 DIAGNOSIS — M7062 Trochanteric bursitis, left hip: Secondary | ICD-10-CM | POA: Diagnosis not present

## 2023-08-08 NOTE — Therapy (Signed)
 [facility] OUTPATIENT PHYSICAL THERAPY TREATMENT [facility]   Patient Name: Sandy Valencia MRN: [id number] DOB:04-16-1961, 63 y.o., female Today's Date: 08/08/2023 PHYSICAL THERAPY DISCHARGE SUMMARY  Visits from Start of Care: 6  Current functional level related to goals / functional outcomes: All goals met   Remaining deficits: none   Education / Equipment: HEP   Patient agrees to discharge. Patient goals were met. Patient is being discharged due to being pleased with the current functional level.  END OF SESSION:   PT End of Session - 08/08/23 1219     Visit Number 6    Number of Visits 6    Date for PT Re-Evaluation 08/09/23    Authorization Type [place] Medicaid Healthy Blue    Authorization Time Period healthy blue approved 9 visits from 06/28/2023-08/26/2023    Authorization - Visit Number 5    Authorization - Number of Visits 6    Progress Note Due on Visit 6    PT Start Time 1145    PT Stop Time 1220    PT Time Calculation (min) 35 min    Activity Tolerance Patient tolerated treatment well    Behavior During Therapy WFL for tasks assessed/performed                    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   Anaphylactic shock, unspecified, subsequent encounter    Anemia, iron deficiency 02/14/2015   patient is not aware this dx   Angio-edema    Arthritis    Asthma    Mild intermittent asthma   Asymptomatic varicose veins of right lower extremity    Chronic obstructive pulmonary disease with (acute) exacerbation (HCC)    patient denies this dx   Chronic rhinitis 05/21/2018   COVID-19 2019   Diabetes mellitus    type 2   Essential (primary) hypertension    Family history of colon cancer 09/03/2012   Fatty liver    Gastro-esophageal reflux disease without esophagitis    GERD (gastroesophageal reflux disease)    Hearing loss    wears hearing aid    History of colonic polyps 08/26/2017   Hypercholesteremia    Hypertension    Hypothyroidism    Low back pain    and left leg pain   Mass of finger, right s/p surgical excision (mucoid cyst) 04/17/18 04/22/2018   Mucous cyst of digit of right hand    Obesity, unspecified    Other adverse food reactions, not elsewhere classified, initial encounter    Personal history of noncompliance with medical treatment, presenting hazards to health 05/18/2015   Rash and other nonspecific skin eruption    Restless legs syndrome    patient denies this dx   Sleep apnea    pt siad, "i had small amount but could not tolerate CPAP and they said it was ok since it was mild.   Unspecified asthma, uncomplicated    Viral URI 07/13/2022   Zoster without complications    Past Surgical History:  Procedure Laterality Date   ABDOMINAL HYSTERECTOMY     BACK SURGERY  11/26/2022   BIOPSY  11/08/2022   Procedure: BIOPSY;  Surgeon: Corbin Ade, MD;  [facility];  Service: Endoscopy;;   CATARACT EXTRACTION W/PHACO Left 03/09/2022   Procedure: CATARACT EXTRACTION PHACO AND INTRAOCULAR LENS PLACEMENT (IOC);  Surgeon: Fabio Pierce, MD;  Location: [facility];  Service: Ophthalmology;  Laterality: Left;  CDE 7.89   CATARACT EXTRACTION W/PHACO Right 03/23/2022   Procedure: CATARACT EXTRACTION PHACO AND INTRAOCULAR LENS PLACEMENT (IOC);  Surgeon: Fabio Pierce, MD;  Location: [facility];  Service: Ophthalmology;  Laterality: Right;  CDE: 2.63   CHOLECYSTECTOMY     COLONOSCOPY N/A 09/22/2012   RMR: colonic polyps -removed as described above. tubular adenoma, next TCS 09/2017   COLONOSCOPY WITH PROPOFOL N/A 09/23/2017   Surgeon: Corbin Ade, MD; internal hemorrhoids, three 5-6 mm polyps removed, diverticulosis in sigmoid and descending colon.  Pathology with tubular adenomas and rectal hyperplastic polyp.  Recommended 5 year surveillance.   COLONOSCOPY WITH PROPOFOL N/A 11/08/2022   Procedure: COLONOSCOPY WITH  PROPOFOL;  Surgeon: Corbin Ade, MD;  [facility];  Service: Endoscopy;  Laterality: N/A;  815am, asa 3   CYST EXCISION Left 07/19/2016   Procedure: CYST REMOVAL LEFT RING FINGER;  Surgeon: Vickki Hearing, MD;  Location: [facility];  Service: Orthopedics;  Laterality: Left;   CYST REMOVAL LEG Right    foot   ESOPHAGOGASTRODUODENOSCOPY N/A 10/21/2014   RMR: Small hiatal hernia; otherwise normal EGD status post passage of a Maloney dilator.    ESOPHAGOGASTRODUODENOSCOPY (EGD) WITH PROPOFOL N/A 11/08/2022   Procedure: ESOPHAGOGASTRODUODENOSCOPY (EGD) WITH PROPOFOL;  Surgeon: Corbin Ade, MD;  [facility];  Service: Endoscopy;  Laterality: N/A;   EXCISION MASS UPPER EXTREMETIES Right 04/17/2018   Procedure: EXCISION MASS UPPER EXTREMETIES right ring finger;  Surgeon: Vickki Hearing, MD;  Location: [facility];  Service: Orthopedics;  Laterality: Right;   LUMBAR LAMINECTOMY/DECOMPRESSION MICRODISCECTOMY Bilateral 11/26/2022   Procedure: Microdiscectomy - bilateral - Lumbar four-Lumbar five;  Surgeon: Julio Sicks, MD;  Location: [facility] OR;  Service: Neurosurgery;  Laterality: Bilateral;   MALONEY DILATION N/A 10/21/2014   Procedure: Elease Hashimoto DILATION;  Surgeon: Corbin Ade, MD;  [facility];  Service: Endoscopy;  Laterality: N/A;   MALONEY DILATION N/A 11/08/2022   Procedure: Elease Hashimoto DILATION;  Surgeon: Corbin Ade, MD;  [facility];  Service: Endoscopy;  Laterality: N/A;   MIDDLE EAR SURGERY Right    patched hole in ear drum   POLYPECTOMY  09/23/2017   Procedure: POLYPECTOMY;  Surgeon: Corbin Ade, MD;  [facility];  Service: Endoscopy;;  polyp hepatic flexure polyp cs, splenic flexure polyp cs, rectal polyp cs   POLYPECTOMY  11/08/2022   Procedure: POLYPECTOMY;  Surgeon: Corbin Ade, MD;  [facility];  Service: Endoscopy;;   TOTAL ABDOMINAL HYSTERECTOMY     Patient Active Problem List   Diagnosis Date  Noted   Acute cystitis without hematuria 06/19/2023   Acute bronchitis 05/24/2023   Chronic mastoiditis of right side 03/24/2023   Varicose veins of right lower extremity 01/22/2023   Acute pain of left lower extremity 10/25/2022   Primary insomnia 09/18/2022   Encounter for examination following treatment at hospital 09/18/2022   Acute non-recurrent frontal sinusitis 07/20/2022   Lumbar radiculopathy 03/08/2022   Statin intolerance 01/19/2022   Encounter for general adult medical examination with abnormal findings 01/16/2022   Lumbar spondylosis 09/05/2021   Allergic rhinitis due to allergen 04/17/2021   Seborrheic keratosis 04/17/2021   Primary osteoarthritis involving multiple joints 12/02/2019   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   Long-term current use of opiate analgesic 09/08/2019   Neck pain 09/07/2019   Mild intermittent asthma without complication 05/21/2018   Gastroesophageal reflux disease 05/21/2018  Non-allergic rhinitis 05/21/2018   History of colonic polyps 08/26/2017   Hypothyroidism 04/22/2017   Mixed hyperlipidemia 12/31/2016   Type 2 diabetes mellitus with hyperglycemia (HCC) 05/18/2015   Essential hypertension 05/18/2015   Morbid obesity (HCC) 05/18/2015   Fatty liver 02/14/2015   Hiatal hernia    Dysphagia, pharyngoesophageal phase    Dysphagia 10/11/2014    PCP: Anabel Halon, MDPCP - General   REFERRING PROVIDER:   Floreen Comber, NP    REFERRING DIAG:  Diagnosis  M54.16 (ICD-10-CM) - Radiculopathy, lumbar region    Rationale for Evaluation and Treatment: Rehabilitation  THERAPY DIAG:  Difficulty in walking, not elsewhere classified  Muscle weakness (generalized)  Trochanteric bursitis of left hip  ONSET DATE: May 18 2023    SUBJECTIVE:                                                                                                                                                                                            SUBJECTIVE STATEMENT:   Pt states that she feels good she still has some soreness but no pain.   PERTINENT HISTORY:  Microdiscectomy 11/26/2022  PAIN:  Are you having pain? No  PRECAUTIONS: None  RED FLAGS: None   WEIGHT BEARING RESTRICTIONS: No  FALLS:  Has patient fallen in last 6 months? No  LIVING ENVIRONMENT: Lives with: lives alone Lives in: House/apartment Stairs: No Has following equipment at home: Single point cane   OCCUPATION: CNA  PLOF: Independent   PATIENT GOALS: To walk pain free   NEXT MD VISIT: Feb 2025  07/17/23     OBJECTIVE:   DIAGNOSTIC FINDINGS:  IMPRESSION: 1. Uncomplicated interval L4-5 micro-discectomy with resolved neural compression. 2. Unchanged L3-4 left foraminal impingement due to herniation and asymmetric facet spurring. 3. No new herniation or affected level.    PATIENT SURVEYS:  Eval : LEFS 30/80 08/08/23:  LEFS 78/80  SCREENING FOR RED FLAGS: Bowel or bladder incontinence: No Spinal tumors: No Cauda equina syndrome: No Compression fracture: No Abdominal aneurysm: No  COGNITION: Overall cognitive status: Within functional limits for tasks assessed  POSTURE: flexed trunk  and weight shift right      FUNCTIONAL TESTS:  5 times sit to stand: 07/04/23: 8.04" no HHA 08/08/23:   5 times sit to stand 6.53       LOWER EXTREMITY ROM:  MMT Right eval Left eval Left  08/08/23  Hip flexion  0-90 pain 125 no pain   Hip extension     Hip abduction     Hip adduction     Hip internal rotation  0-15 pain 50  Hip  external rotation  0-25 pain 25  Knee flexion     Knee extension     Ankle dorsiflexion     Ankle plantarflexion     Ankle inversion     Ankle eversion      (Blank rows = not tested)      LOWER EXTREMITY MMT:     Active  Right eval Left eval 08/08/23  Hip flexion 5 3-/5 5-  Hip extension 4  4  Hip abduction 5  4  Hip adduction     Hip internal rotation  3-/5   Hip external rotation   3-/5   Knee flexion 5  5  Knee extension 5  4+  Ankle dorsiflexion 5  5  Ankle plantarflexion     Ankle inversion     Ankle eversion      (Blank rows = not tested) PALPATION: Moderate tenderness to palpation left trochanteric bursae,  Moderate tenderness to palpation left glut medius    TODAY'S TREATMENT:                                                                                                                              DATE:  08/08/23: reassess  see above  Wt 217.4  Bridge x 15 Sit to stand x 15  08/01/23 Wall arch x 10 IT band stretch 3 x 30" Sit to stands 15X Stand against wall UE flexion 10X Postural exercises : Scapular retraction, rows and shoulder extension x 10 each with green therabant.  Lunging onto 6" step x 10 each  Side step x  2 RT  Single leg stance x 5 each  Marching with no UE assist x 10 Side step x 3 RT  Nustep level 3 seat at 7 x 8 minutes  07/25/23 Wall arch x 10 IT band stretch 3 x 30" Sit to stands 15X Stand against wall UE flexion 15X Shoulder retraction against wall 15X Chest stretch in doorway 3X20" Standing no UE lunges 15X onto 4" step Standing 1 UE alternating marches 15X Standing forward step ups 4" 15X each SLS 4-5" max each LE without UE assist Vectors 10X5" holds each LE with 1 UE assist  PATIENT EDUCATION:  Education details: HEP focus on walking and gluteal max strengthening,(bridge and sit to stand) Person educated: Patient Education method: Medical illustrator Education comprehension: verbalized understanding  HOME EXERCISE PROGRAM: Access Code: [email or web address] URL: [email or web address] Date: 06/28/2023 Prepared by: Seymour Bars  Exercises - Clamshell with Resistance  - 1-2 x daily - 2 sets - 10 reps - Sidelying Reverse Clamshell with Resistance  - 1-2 x daily - 2 sets - 10 reps  Patient Education - Reciprocal Gait with Cane  Access Code: [email or web address] URL:  [email or web address] Date: 07/04/2023 Prepared by: Becky Sax  Exercises - Bridge with Resistance  - 1-2 x daily - 7 x weekly - 2 sets - 10 reps - 5" hold - Supine March  - 1 x daily -  7 x weekly - 3 sets - 10 reps - Sit to Stand  - 1 x daily - 7 x weekly - 2 sets - 10 reps  Patient Education - Reciprocal Gait with Gilmer Mor 07/11/23 - Heel Raises with Counter Support  - 1 x daily - 7 x weekly - 1 sets - 10 reps - 3-5" hold - Side Stepping with Resistance at Ankles  - 1 x daily - 7 x weekly - 1 sets - 2 reps - Mini Squat  - 1 x daily - 7 x weekly - 1 sets - 10 reps - 3-5" hold Tensor stretch  08/01/27: Theraband postural exercises.  - Standing Single Leg Stance with Counter Support  - 1 x daily - 7 x weekly - 3 sets - 10 reps - Standing Balance in Corner with Eyes Closed  - 1 x daily - 7 x weekly - 3 sets - 10 reps - Standing 3-Way Kick  - 1 x daily - 7 x weekly - 3 sets - 10 reps  ASSESSMENT:  CLINICAL IMPRESSION: Pt reassessed; all goals have been met. PT is I in HEP.  PT will be discharged at this time PERSONAL FACTORS:  n/a  are also affecting patient's functional outcome.   REHAB POTENTIAL: Good  CLINICAL DECISION MAKING: Stable/uncomplicated  EVALUATION COMPLEXITY: Low    GOALS: Goals reviewed with patient? No  SHORT TERM GOALS: Target date: 3 sessions   1. Patient will be independent with a basic stretching/strengthening HEP  Baseline:  Goal status:met    LONG TERM GOALS: Target date: 6 sessions   Patient will be able to score a >/= 40/80 on the LEFS    to demonstrate an improvement in overall housework, ADL completion, mobility, and self-care.Baseline:  Goal status: on-going   2.   Patient will complete the 5 times sit to stand:    within 15 seconds with no pain  to demonstrate an improvement lower extremity strength needed for home and community ambulation  Baseline: 07/04/23: 5 STS 8.04" Goal status: MET  3.  Patient will be independent  with a comprehensive strengthening HEP  Baseline:  Goal status: met  4. Patient will be able to demo left hip flexion active range of motion 0-100 with no pain to facilitate squatting, bending  Baseline:  Goal status: met    PLAN:  PT FREQUENCY: 1x/week  PT DURATION:  6 sessions  PLANNED INTERVENTIONS: 97110-Therapeutic exercises, 97530- Therapeutic activity, 97112- Neuromuscular re-education, 97535- Self Care, [id number]- Manual therapy, [phone]- Gait training, Patient/Family education, Balance training, Stair training, Dry Needling, Joint mobilization, Joint manipulation, Spinal manipulation, Spinal mobilization, Cryotherapy, and Moist heat.  PLAN FOR NEXT SESSION: discharge Virgina Organ, [place] [phone]  [phone] 08/08/2023, 12:20 PM

## 2023-08-16 ENCOUNTER — Ambulatory Visit: Admitting: Allergy & Immunology

## 2023-08-16 ENCOUNTER — Other Ambulatory Visit: Payer: Self-pay

## 2023-08-16 ENCOUNTER — Encounter: Payer: Self-pay | Admitting: Allergy & Immunology

## 2023-08-16 VITALS — BP 144/62 | HR 72 | Temp 97.8°F | Resp 18 | Wt 217.5 lb

## 2023-08-16 DIAGNOSIS — J310 Chronic rhinitis: Secondary | ICD-10-CM

## 2023-08-16 DIAGNOSIS — J4520 Mild intermittent asthma, uncomplicated: Secondary | ICD-10-CM

## 2023-08-16 DIAGNOSIS — K219 Gastro-esophageal reflux disease without esophagitis: Secondary | ICD-10-CM | POA: Diagnosis not present

## 2023-08-16 DIAGNOSIS — T7800XD Anaphylactic reaction due to unspecified food, subsequent encounter: Secondary | ICD-10-CM | POA: Diagnosis not present

## 2023-08-16 MED ORDER — EPINEPHRINE 0.3 MG/0.3ML IJ SOAJ: 0.3000 mg | INTRAMUSCULAR | 1 refills | Status: AC | PRN

## 2023-08-16 NOTE — Progress Notes (Signed)
 FOLLOW UP  Date of Service/Encounter:  08/16/23   Assessment:   Mild intermittent asthma, uncomplicated - with worsening lung function today   Adverse food reaction (walnut, kiwi, seafood)    Gastroesophageal reflux disease - controlled with a pantoprazole   Chronic nonallergic rhinitis - with negative environmental IgE panel in 2019   Plan/Recommendations:   1. Mild intermittent asthma, uncomplicated - Lung testing looks amazing.  - We are not going to make any medications.  - We will continue with the albuterol 4 puffs as needed for coughing and wheezing.  - We are not going to make any changes at this time.  - Continue with albuterol 4 puffs every 4-6 hours as needed.   - There is no need for a controller medication at this time.   2. Gastroesophageal reflux disease - Continue with pantoprazole 40mg  daily.  3. Anaphylaxis to food - Continue to avoid kiwi and walnuts and seafood.  - EpiPen updated today.   4. Chronic rhinitis - Continue fluticasone one spray per nostril daily as needed.  - Continue with cetirizine 10mg  daily as needed.  5. Return in about 6 months (around 02/16/2024). You can have the follow up appointment with Dr. Dellis Anes or a Nurse Practicioner (our Nurse Practitioners are excellent and always have Physician oversight!).   Subjective:   Sandy Valencia is a 63 y.o. female presenting today for follow up of  Chief Complaint  Patient presents with   Follow-up    Sandy Valencia has a history of the following: Patient Active Problem List   Diagnosis Date Noted   Acute cystitis without hematuria 06/19/2023   Acute bronchitis 05/24/2023   Chronic mastoiditis of right side 03/24/2023   Varicose veins of right lower extremity 01/22/2023   Acute pain of left lower extremity 10/25/2022   Primary insomnia 09/18/2022   Encounter for examination following treatment at hospital 09/18/2022   Acute non-recurrent frontal sinusitis 07/20/2022   Lumbar  radiculopathy 03/08/2022   Statin intolerance 01/19/2022   Encounter for general adult medical examination with abnormal findings 01/16/2022   Lumbar spondylosis 09/05/2021   Allergic rhinitis due to allergen 04/17/2021   Seborrheic keratosis 04/17/2021   Primary osteoarthritis involving multiple joints 12/02/2019   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   Long-term current use of opiate analgesic 09/08/2019   Neck pain 09/07/2019   Mild intermittent asthma without complication 05/21/2018   Gastroesophageal reflux disease 05/21/2018   Non-allergic rhinitis 05/21/2018   History of colonic polyps 08/26/2017   Hypothyroidism 04/22/2017   Mixed hyperlipidemia 12/31/2016   Type 2 diabetes mellitus with hyperglycemia (HCC) 05/18/2015   Essential hypertension 05/18/2015   Morbid obesity (HCC) 05/18/2015   Fatty liver 02/14/2015   Hiatal hernia    Dysphagia, pharyngoesophageal phase    Dysphagia 10/11/2014    History obtained from: chart review and patient.  Discussed the use of AI scribe software for clinical note transcription with the patient and/or guardian, who gave verbal consent to proceed.  Sandy Valencia is a 63 y.o. female presenting for a follow up visit.  She was last seen in December 2024.  At that time, we continue with albuterol as needed.  We did start her on Tessalon Perles and started her on azithromycin and methylprednisolone.  For her nonallergic rhinitis, she was continued on Flonase as well as Zyrtec.  Reflux was controlled with pantoprazole.  She continue to avoid kiwi, walnut, and seafood.  Since last visit, she has done well.  She traveled to [place] in December 2024, where she engaged in extensive walking, resulting in muscle therapy for her hips. Despite the physical activity, she enjoyed the trip and the food, noting a weight loss of six pounds upon her return.     Asthma/Respiratory Symptom History: She experiences significant improvement in her  breathing. She uses her albuterol inhaler infrequently, only during episodes of a cold or similar symptoms.  She has had prednisone and azithromycin last time we saw her, but otherwise has not needed it in years.  Allergic Rhinitis Symptom History: Her allergies are managed with Flonase, which effectively keep her nasal passages clear.  She also remains on cetirizine daily.  Food Allergy Symptom History: She continues to avoid seafood as well as kiwi and walnuts.  Her EpiPen is out of date and she needs today.  GERD Symptom History: She takes a daily heartburn medication regularly and does not require a refill at this time.  She takes pantoprazole daily.  Her grandson Sandy Valencia is a Holiday representative at [employer].  He is majoring in Public relations account executive and is having a rather challenging time.  Otherwise, there have been no changes to her past medical history, surgical history, family history, or social history.    Review of systems otherwise negative other than that mentioned in the HPI.    Objective:   Blood pressure (!) 144/62, pulse 72, temperature 97.8 F (36.6 C), resp. rate 18, weight 217 lb 8 oz (98.7 kg), SpO2 96%. Body mass index is 36.19 kg/m.    Physical Exam Vitals reviewed.  Constitutional:      Appearance: She is well-developed.     Comments: Extremely talkative. Boisterous.   HENT:     Head: Normocephalic and atraumatic.     Right Ear: Tympanic membrane, ear canal and external ear normal.     Left Ear: Tympanic membrane, ear canal and external ear normal.     Nose: No nasal deformity, septal deviation, mucosal edema or rhinorrhea.     Right Turbinates: Enlarged, swollen and pale.     Left Turbinates: Enlarged, swollen and pale.     Right Sinus: No maxillary sinus tenderness or frontal sinus tenderness.     Left Sinus: No maxillary sinus tenderness or frontal sinus tenderness.     Comments: No nasal polyps noted.     Mouth/Throat:     Lips: Pink.     Mouth:  Mucous membranes are moist. Mucous membranes are not pale and not dry.     Pharynx: Uvula midline.     Comments: Cobblestoning present.  Eyes:     General: Lids are normal. Allergic shiner present.        Right eye: No discharge.        Left eye: No discharge.     Conjunctiva/sclera: Conjunctivae normal.     Right eye: Right conjunctiva is not injected. No chemosis.    Left eye: Left conjunctiva is not injected. No chemosis.    Pupils: Pupils are equal, round, and reactive to light.  Cardiovascular:     Rate and Rhythm: Normal rate and regular rhythm.     Heart sounds: Normal heart sounds.  Pulmonary:     Effort: Pulmonary effort is normal. No tachypnea, accessory muscle usage or respiratory distress.     Breath sounds: Normal breath sounds. No wheezing, rhonchi or rales.     Comments: Moving air well in all lung fields. Chest:     Chest wall: No tenderness.  Lymphadenopathy:  Cervical: No cervical adenopathy.  Skin:    Coloration: Skin is not pale.     Findings: No abrasion, erythema, petechiae or rash. Rash is not papular, urticarial or vesicular.  Neurological:     Mental Status: She is alert.  Psychiatric:        Behavior: Behavior is cooperative.      Diagnostic studies:    Spirometry: results normal (FEV1: 2.27/90%, FVC: 3.01/93%, FEV1/FVC: 75%).    Spirometry consistent with normal pattern.    Allergy Studies: none      Malachi Bonds, MD  [facility]

## 2023-08-16 NOTE — Patient Instructions (Addendum)
 1. Mild intermittent asthma, uncomplicated - Lung testing looks amazing.  - We are not going to make any medications.  - We will continue with the albuterol 4 puffs as needed for coughing and wheezing.  - We are not going to make any changes at this time.  - Continue with albuterol 4 puffs every 4-6 hours as needed.   - There is no need for a controller medication at this time.   2. Gastroesophageal reflux disease - Continue with pantoprazole 40mg  daily.  3. 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 - EpiPen updated today.   4. Chronic rhinitis - Continue fluticasone one spray per nostril daily as needed.  - Continue with cetirizine 10mg  daily as needed.  5. Return in about 6 months (around 02/16/2024). You can have the follow up appointment with Dr. Dellis Anes or a Nurse Practicioner (our Nurse Practitioners are excellent and always have Physician oversight!).    Please inform us of any Emergency Department visits, hospitalizations, or changes in symptoms. Call us before going to the ED for breathing or allergy symptoms since we might be able to fit you in for a sick visit. Feel free to contact us anytime with any questions, problems, or concerns.  It was a pleasure to see you again today! We always love seeing you!   Websites that have reliable patient information: 1. American Academy of Asthma, Allergy, and Immunology: www.aaaai.org 2. Food Allergy Research and Education (FARE): foodallergy.org 3. Mothers of Asthmatics: http://www.asthmacommunitynetwork.org 4. American College of Allergy, Asthma, and Immunology: www.acaai.org      "Like" [place] on Facebook and Instagram for our latest updates!      A healthy democracy works best when [employer] participate! Make sure you are registered to vote! If you have moved or changed any of your contact information, you will need to get this updated before voting! Scan the QR codes below to learn more!

## 2023-08-31 ENCOUNTER — Other Ambulatory Visit: Payer: Self-pay | Admitting: Internal Medicine

## 2023-08-31 DIAGNOSIS — L304 Erythema intertrigo: Secondary | ICD-10-CM

## 2023-09-02 ENCOUNTER — Other Ambulatory Visit (HOSPITAL_COMMUNITY): Payer: Self-pay

## 2023-09-02 ENCOUNTER — Telehealth: Payer: Self-pay | Admitting: Pharmacy Technician

## 2023-09-02 NOTE — Telephone Encounter (Signed)
 Pharmacy Patient Advocate Encounter   Received notification from CoverMyMeds that prior authorization for Dexcom G6 Sensor is required/requested.   Insurance verification completed.   The patient is insured through [facility] .   Per test claim: PA required; PA submitted to above mentioned insurance via CoverMyMeds Key/confirmation #/EOC [id number] Status is pending

## 2023-09-02 NOTE — Telephone Encounter (Signed)
 Pharmacy Patient Advocate Encounter  Received notification from [facility] that Prior Authorization for Dexcom G6 Sensor has been DENIED.  Full denial letter will be uploaded to the media tab. See denial reason below.    PA #/Case ID/Reference #: [id number]

## 2023-09-06 ENCOUNTER — Telehealth: Payer: Self-pay | Admitting: *Deleted

## 2023-09-06 NOTE — Telephone Encounter (Signed)
 I would send over documentation of what her worst A1c was to show improvement that way.  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

## 2023-09-06 NOTE — Telephone Encounter (Signed)
 Here is a list of the patient's HgA1C. 07/16/23 - 9.5 , 03/05/23 - 8.5 , 10/22/22 - 8.6 , 01/19/240- 8.6. Per Ronny Bacon , NP - the patient has and would continue to benefit from [facility] sensors . Keep in mind that there can be changes in the A1C results depending on the outside sources.  Whitney would like to move forward with the PA for the patient.

## 2023-09-06 NOTE — Telephone Encounter (Signed)
 Sending this over for the PA team to see  Whitney's note.

## 2023-09-06 NOTE — Telephone Encounter (Signed)
 I think it's because her old labwork shows her A1c going down, but when you compare it to the most recent labs it shows it increasing. And if I submit it without the most recent labs, it will most likely get denied again stating that she needs recent lab work. Her office visit chart notes that they require talks about the A1c increase and her course worsening. Would you like for me to send this to my pharmacist for an Appeal?

## 2023-09-09 NOTE — Telephone Encounter (Signed)
 Information has been sent to clinical pharmacist for appeals review. It may take 5-7 days to prepare the necessary documentation to request the appeal from the insurance.

## 2023-09-09 NOTE — Telephone Encounter (Signed)
 Per Whitney precede with the appeal.

## 2023-09-09 NOTE — Telephone Encounter (Signed)
 Yes please

## 2023-09-09 NOTE — Telephone Encounter (Signed)
 Yes, please.

## 2023-09-10 ENCOUNTER — Telehealth: Payer: Self-pay | Admitting: Pharmacist

## 2023-09-10 DIAGNOSIS — M545 Low back pain, unspecified: Secondary | ICD-10-CM | POA: Diagnosis not present

## 2023-09-10 DIAGNOSIS — Z79899 Other long term (current) drug therapy: Secondary | ICD-10-CM | POA: Diagnosis not present

## 2023-09-10 DIAGNOSIS — M5116 Intervertebral disc disorders with radiculopathy, lumbar region: Secondary | ICD-10-CM | POA: Diagnosis not present

## 2023-09-10 DIAGNOSIS — G894 Chronic pain syndrome: Secondary | ICD-10-CM | POA: Diagnosis not present

## 2023-09-10 DIAGNOSIS — Z79891 Long term (current) use of opiate analgesic: Secondary | ICD-10-CM | POA: Diagnosis not present

## 2023-09-10 NOTE — Telephone Encounter (Signed)
 Appeal has been submitted for Dexcome G6 Sensors. Will advise when response is received, please be advised that most companies may take 30 days to make a decision. Appeal letter and supporting documentation were sent to [phone] on 09/10/2023 @4 :45 pm.  Thank you, Dellie Burns, PharmD Clinical Pharmacist  [place]  Direct Dial: [phone]

## 2023-09-10 NOTE — Telephone Encounter (Signed)
 Information has been sent to clinical pharmacist for appeals review. It may take 5-7 days to prepare the necessary documentation to request the appeal from the insurance.

## 2023-09-12 NOTE — Telephone Encounter (Signed)
 Patient was called and updated on what is going on  with the Dexcom G7.

## 2023-09-18 ENCOUNTER — Ambulatory Visit (INDEPENDENT_AMBULATORY_CARE_PROVIDER_SITE_OTHER): Payer: Medicaid Other

## 2023-09-25 ENCOUNTER — Other Ambulatory Visit (HOSPITAL_COMMUNITY): Payer: Self-pay

## 2023-09-25 NOTE — Telephone Encounter (Signed)
 Based on test claim, Insurance has approved the appeal for Dexcom G6 Sensor:

## 2023-10-04 ENCOUNTER — Other Ambulatory Visit: Payer: Self-pay | Admitting: Internal Medicine

## 2023-10-04 DIAGNOSIS — I10 Essential (primary) hypertension: Secondary | ICD-10-CM

## 2023-10-07 ENCOUNTER — Other Ambulatory Visit: Payer: Self-pay

## 2023-10-07 ENCOUNTER — Emergency Department (HOSPITAL_COMMUNITY)

## 2023-10-07 ENCOUNTER — Encounter (HOSPITAL_COMMUNITY): Payer: Self-pay

## 2023-10-07 ENCOUNTER — Emergency Department (HOSPITAL_COMMUNITY)
Admission: EM | Admit: 2023-10-07 | Discharge: 2023-10-07 | Disposition: A | Discharge status: Home / Self Care | Attending: Emergency Medicine | Admitting: Emergency Medicine

## 2023-10-07 DIAGNOSIS — M25461 Effusion, right knee: Secondary | ICD-10-CM | POA: Diagnosis not present

## 2023-10-07 DIAGNOSIS — M1711 Unilateral primary osteoarthritis, right knee: Secondary | ICD-10-CM | POA: Diagnosis not present

## 2023-10-07 DIAGNOSIS — M7651 Patellar tendinitis, right knee: Secondary | ICD-10-CM | POA: Diagnosis not present

## 2023-10-07 DIAGNOSIS — M25561 Pain in right knee: Secondary | ICD-10-CM | POA: Diagnosis not present

## 2023-10-07 DIAGNOSIS — Z7982 Long term (current) use of aspirin: Secondary | ICD-10-CM | POA: Diagnosis not present

## 2023-10-07 DIAGNOSIS — Z794 Long term (current) use of insulin: Secondary | ICD-10-CM | POA: Insufficient documentation

## 2023-10-07 MED ORDER — DEXAMETHASONE SODIUM PHOSPHATE 10 MG/ML IJ SOLN
10.0000 mg | Freq: Once | INTRAMUSCULAR | Status: AC
  Administered 2023-10-07: 10 mg via INTRAMUSCULAR
  Filled 2023-10-07: qty 1

## 2023-10-07 MED ORDER — KETOROLAC TROMETHAMINE 15 MG/ML IJ SOLN
15.0000 mg | Freq: Once | INTRAMUSCULAR | Status: AC
  Administered 2023-10-07: 15 mg via INTRAMUSCULAR
  Filled 2023-10-07: qty 1

## 2023-10-07 MED ORDER — MELOXICAM 7.5 MG PO TABS: 7.5000 mg | ORAL_TABLET | Freq: Every day | ORAL | 0 refills | Status: DC

## 2023-10-07 NOTE — ED Provider Notes (Signed)
 [facility] EMERGENC[facility] DEPARTMENT AT [facility] Provider Note   CSN: [id number] Arrival date & time: 10/07/23  2001     History  Chief Complaint  Patient presents with   Knee Pain    right    Sandy Valencia is a 63 y.o. female.  Patient reports nontraumatic right knee pain that began earlier today.  Patient reports that the pain is really only there if she completely straightens the knee or if she bears weight.       Home Medications Prior to Admission medications   Medication Sig Start Date End Date Taking? Authorizing Provider  meloxicam (MOBIC) 7.5 MG tablet Take 1 tablet (7.5 mg total) by mouth daily. 10/07/23  Yes Stefany Starace, Marine Sia, MD  Accu-Chek FastClix Lancets MISC TEST FOUR TIMES DAILY 03/01/23   Meldon Sport, MD  aspirin EC 81 MG tablet Take 81 mg by mouth daily.    [provider]  benzonatate  (TESSALON ) 100 MG capsule Take 1 capsule (100 mg total) by mouth 3 (three) times daily as needed for cough. 05/26/23   Ardie Kras, FNP  cetirizine  (ZYRTEC ) 10 MG tablet Take 1 tablet (10 mg total) by mouth daily. 05/26/23   Ardie Kras, FNP  Cholecalciferol  (VITAMIN D -3) 1000 UNITS CAPS Take 1,000 Units by mouth daily.     [provider]  Continuous Glucose Sensor (DEXCOM G6 SENSOR) MISC CHANGE SENSOR EVERY 10 DAYS AS DIRECTED. 04/22/23   Wendel Hals, NP  Continuous Glucose Transmitter (DEXCOM G6 TRANSMITTER) MISC CHANGE TRANSMITTER EVERY 90 DAYS AS DIRECTED. 07/16/23   Wendel Hals, NP  diclofenac  Sodium (VOLTAREN ) 1 % GEL APPLY 2 GRAMS TO AFFECTED AREAS 2 TIMES A DAY AS NEEDED 04/05/23   Meldon Sport, MD  EPINEPHrine  0.3 mg/0.3 mL IJ SOAJ injection Inject 0.3 mg into the muscle as needed for anaphylaxis. 08/16/23   Rochester Chuck, MD  fluticasone  (FLONASE ) 50 MCG/ACT nasal spray Place 2 sprays into both nostrils daily as needed. 05/26/23   Ardie Kras, FNP  glucose blood (ACCU-CHEK GUIDE) test strip test FOUR TIMES DAILY  03/01/23   Meldon Sport, MD  HYDROcodone -acetaminophen  (NORCO) 7.5-325 MG tablet Take 1 tablet by mouth 4 (four) times daily as needed. 07/31/23   [provider]  insulin  aspart (NOVOLOG  FLEXPEN) 100 UNIT/ML FlexPen Inject 20-26 Units into the skin 3 (three) times daily with meals. 03/05/23   Wendel Hals, NP  ketoconazole  (NIZORAL ) 2 % cream APPLY TO AFFECTED AREAS TWICE DAILY 09/02/23   Meldon Sport, MD  LANTUS  SOLOSTAR 100 UNIT/ML Solostar Pen Inject 70 Units into the skin at bedtime. 03/05/23   Wendel Hals, NP  levothyroxine  (SYNTHROID ) 75 MCG tablet TAKE ONE (1) TABLET BY MOUTH EVERY DAY BEFORE BREAKFAST 03/05/23   Wendel Hals, NP  LITETOUCH PEN NEEDLES 31G X 8 MM MISC USE FOUR TIMES DAILY 03/01/23   Meldon Sport, MD  metFORMIN  (GLUCOPHAGE -XR) 500 MG 24 hr tablet Take 1 tablet (500 mg total) by mouth 2 (two) times daily with a meal. 03/05/23   Wendel Hals, NP  olmesartan  (BENICAR ) 40 MG tablet TAKE ONE TABLET (40MG  TOTAL) BY MOUTH DAILY 10/04/23   Meldon Sport, MD  pantoprazole  (PROTONIX ) 40 MG tablet TAKE ONE TABLET (40MG  TOTAL) BY MOUTH DAILY 05/24/23   Rochester Chuck, MD  pregabalin  (LYRICA ) 100 MG capsule Take 100 mg by mouth 2 (two) times daily. 04/20/22   [provider]  Semaglutide ,  1 MG/DOSE, (OZEMPIC , 1 MG/DOSE,) 4 MG/3ML SOPN Inject 1 mg into the skin once a week. 03/05/23   Wendel Hals, NP  simvastatin  (ZOCOR ) 10 MG tablet Take 1 tablet (10 mg total) by mouth daily at 6 PM. 06/19/23   Meldon Sport, MD  tiZANidine (ZANAFLEX) 2 MG tablet Take 2 mg by mouth 2 (two) times daily. 04/02/23   [provider]  VENTOLIN  HFA 108 (90 Base) MCG/ACT inhaler INHALE 4 PUFFS INTO THE LUNGS EVERY 6 HOURS AS NEEDED FOR WHEEZING OR SHORTNESS OF BREATH 05/24/23   Rochester Chuck, MD  vitamin B-12 (CYANOCOBALAMIN ) 500 MCG tablet Take 500 mcg by mouth daily.    [provider]      Allergies    Kiwi extract, Other,  and Statins    Review of Systems   Review of Systems  Physical Exam Updated Vital Signs BP (!) 149/73 (BP Location: Right Arm)   Pulse 70   Temp 97.8 F (36.6 C) (Oral)   Resp 18   Ht 5\' 5"  (1.651 m)   Wt 98.7 kg   SpO2 97%   BMI 36.21 kg/m  Physical Exam Vitals and nursing note reviewed.  Constitutional:      Appearance: Normal appearance.  Cardiovascular:     Pulses:          Dorsalis pedis pulses are 1+ on the right side.  Musculoskeletal:        General: Normal range of motion.     Right knee: No swelling, deformity, effusion, erythema, ecchymosis or lacerations. Normal range of motion. No tenderness. No LCL laxity, MCL laxity, ACL laxity or PCL laxity. Normal pulse.  Neurological:     Mental Status: She is alert.     ED Results / Procedures / Treatments   Labs (all labs ordered are listed, but only abnormal results are displayed) Labs Reviewed - No data to display  EKG None  Radiology DG Knee Complete 4 Views Right Result Date: 10/07/2023 CLINICAL DATA:  Right knee pain. EXAM: RIGHT KNEE - COMPLETE 4+ VIEW COMPARISON:  None Available. FINDINGS: No evidence of fracture or dislocation. Alignment and joint spaces are preserved. Mild tricompartmental peripheral spurring. No erosions or focal bone abnormality. Small quadriceps and patellar tendon enthesophytes. Minimal joint effusion. Soft tissues are unremarkable. IMPRESSION: Mild tricompartmental osteoarthritis. Minimal joint effusion. Electronically Signed   By: Chadwick Colonel M.D.   On: 10/07/2023 20:49    Procedures Procedures    Medications Ordered in ED Medications  dexamethasone  (DECADRON ) injection 10 mg (has no administration in time range)  ketorolac  (TORADOL ) 15 MG/ML injection 15 mg (has no administration in time range)    ED Course/ Medical Decision Making/ A&P                                 Medical Decision Making  Differential diagnosis considered includes, but not limited to:   Orthopedic injury; arthritis; septic arthritis; ischemic limb; DVT; cellulitis; phlebitis   Examination is unremarkable.  No joint effusion, erythema, warmth.  Patient has retained to normal range of motion.  No concern for septic arthritis.  Patient with normal overlying skin, no signs of cellulitis.  Patient has palpable pulses, no concern for ischemic limb.  X-ray shows tricompartmental arthritis which is likely the cause.  I do not feel any ligamentous injury, cannot rule out meniscus etiology.  Treat empirically, follow-up with orthopedics as needed.  Final Clinical Impression(s) / ED Diagnoses Final diagnoses:  Acute pain of right knee    Rx / DC Orders ED Discharge Orders          Ordered    meloxicam (MOBIC) 7.5 MG tablet  Daily        10/07/23 2307              Ballard Bongo, MD 10/07/23 2308

## 2023-10-07 NOTE — ED Triage Notes (Signed)
 Pt reports right knee pain that started today, pt denies injury.

## 2023-10-10 ENCOUNTER — Encounter: Payer: Self-pay | Admitting: Gastroenterology

## 2023-10-10 ENCOUNTER — Encounter (HOSPITAL_COMMUNITY): Payer: Self-pay

## 2023-10-10 ENCOUNTER — Ambulatory Visit: Admitting: Podiatry

## 2023-10-11 DIAGNOSIS — M25561 Pain in right knee: Secondary | ICD-10-CM | POA: Diagnosis not present

## 2023-10-16 ENCOUNTER — Other Ambulatory Visit: Payer: Self-pay | Admitting: Internal Medicine

## 2023-10-16 DIAGNOSIS — Z1231 Encounter for screening mammogram for malignant neoplasm of breast: Secondary | ICD-10-CM

## 2023-10-17 ENCOUNTER — Ambulatory Visit: Payer: Medicaid Other | Admitting: Internal Medicine

## 2023-10-17 ENCOUNTER — Encounter: Payer: Self-pay | Admitting: Internal Medicine

## 2023-10-17 VITALS — BP 131/77 | HR 79 | Ht 65.0 in | Wt 221.0 lb

## 2023-10-17 DIAGNOSIS — Z794 Long term (current) use of insulin: Secondary | ICD-10-CM | POA: Diagnosis not present

## 2023-10-17 DIAGNOSIS — M5416 Radiculopathy, lumbar region: Secondary | ICD-10-CM

## 2023-10-17 DIAGNOSIS — E782 Mixed hyperlipidemia: Secondary | ICD-10-CM | POA: Diagnosis not present

## 2023-10-17 DIAGNOSIS — M1711 Unilateral primary osteoarthritis, right knee: Secondary | ICD-10-CM | POA: Diagnosis not present

## 2023-10-17 DIAGNOSIS — E039 Hypothyroidism, unspecified: Secondary | ICD-10-CM | POA: Diagnosis not present

## 2023-10-17 DIAGNOSIS — I10 Essential (primary) hypertension: Secondary | ICD-10-CM

## 2023-10-17 DIAGNOSIS — E1165 Type 2 diabetes mellitus with hyperglycemia: Secondary | ICD-10-CM

## 2023-10-17 MED ORDER — PREGABALIN 100 MG PO CAPS: 100.0000 mg | ORAL_CAPSULE | Freq: Two times a day (BID) | ORAL | 3 refills | Status: DC

## 2023-10-17 NOTE — Assessment & Plan Note (Addendum)
 On Lyrica  and Norco PRN Increased dose of Lyrica  200 mg twice daily due to radicular pain and leg weakness Has meloxicam  15 mg once daily, was prescribed from recent ER visit Referred to PT S/p microdiscectomy L4-5 (06/24) Followed by spine surgery and pain clinic Flexeril  as needed for muscle spasms

## 2023-10-17 NOTE — Assessment & Plan Note (Signed)
 Recent ER visit for right knee pain, had x-ray of right knee reviewed She was seen by [facility] after it - had steroid injection with mild relief Continue meloxicam  as needed for pain, can alternate with Tylenol  arthritis

## 2023-10-17 NOTE — Assessment & Plan Note (Signed)
 Last lipid profile reviewed Restarted simvastatin  in the last visit as her liver enzymes have normalized now - check CMP and lipid profile

## 2023-10-17 NOTE — Patient Instructions (Signed)
 Please start taking Lyrica  twice daily.  Please continue to take medications as prescribed.  Please continue to follow low carb diet and perform moderate exercise/walking as tolerated.

## 2023-10-17 NOTE — Progress Notes (Signed)
 Established Patient Office Visit  Subjective:  Patient ID: Sandy Valencia, female    DOB: 1961-03-31  Age: 63 y.o. MRN: [id number]  CC:  Chief Complaint  Patient presents with   Medical Management of Chronic Issues    4 month f/u , pt reports upper leg pain.     HPI GEORGINE Valencia is a 63 y.o. female with past medical history of HTN, DM, hypothyroidism, NAFLD, GERD, chronic pain syndrome and morbid obesity who presents for follow up of her chronic medical conditions.  HTN: Her BP was wnl today.  She is on olmesartan  40 mg QD. She denies any headache, dizziness, chest pain, dyspnea or palpitations.  Type II DM: Followed by endocrinology. Last HbA1c was 9.5 in 02/25. She has been taking Ozempic , but inconsistently.  She has stopped taking Ozempic  for the last 2 weeks due to severe leg pain.  She had stopped it in the past during physical therapy as well.  She takes Lantus  70 units nightly and takes NovoLog  ISS in addition to metformin .  Her blood glucose has been ranging around 150-200 most of the time, with morning readings above 200. She had Dexcom, but has run out of her sensors.  Lumbar radiculopathy: She had L4-5 microdiscectomy in 06/24.  She had been doing well since the surgery till recently.  She is currently taking Norco as needed for severe pain and Lyrica  for radicular symptoms.  She reports bilateral thigh area pain, which is constant for the last 5 days, dull, and worse with standing/walking.  She feels weak in bilateral LE and has balance problem while walking.  Of note, her dose of Lyrica  was reduced to 100 mg QD from BID in 01/25 and she has felt worsening of pain since then.  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   Anaphylactic shock, unspecified, subsequent encounter    Anemia, iron deficiency 02/14/2015   patient is not aware this dx   Angio-edema    Arthritis    Asthma     Mild intermittent asthma   Asymptomatic varicose veins of right lower extremity    Chronic obstructive pulmonary disease with (acute) exacerbation (HCC)    patient denies this dx   Chronic rhinitis 05/21/2018   COVID-19 2019   Diabetes mellitus    type 2   Essential (primary) hypertension    Family history of colon cancer 09/03/2012   Fatty liver    Gastro-esophageal reflux disease without esophagitis    GERD (gastroesophageal reflux disease)    Hearing loss    wears hearing aid   History of colonic polyps 08/26/2017   Hypercholesteremia    Hypertension    Hypothyroidism    Low back pain    and left leg pain   Mass of finger, right s/p surgical excision (mucoid cyst) 04/17/18 04/22/2018   Mucous cyst of digit of right hand    Obesity, unspecified    Other adverse food reactions, not elsewhere classified, initial encounter    Personal history of noncompliance with medical treatment, presenting hazards to health 05/18/2015   Rash and other nonspecific skin eruption    Restless legs syndrome    patient denies this dx   Sleep apnea    pt siad, "i had small amount but could not tolerate CPAP and they said it was ok since it was mild.   Unspecified asthma, uncomplicated  Viral URI 07/13/2022   Zoster without complications     Past Surgical History:  Procedure Laterality Date   ABDOMINAL HYSTERECTOMY     BACK SURGERY  11/26/2022   BIOPSY  11/08/2022   Procedure: BIOPSY;  Surgeon: Suzette Espy, MD;  [facility]: [facility];  Service: Endoscopy;;   CATARACT EXTRACTION W/PHACO Left 03/09/2022   Procedure: CATARACT EXTRACTION PHACO AND INTRAOCULAR LENS PLACEMENT (IOC);  Surgeon: Tarri Farm, MD;  Location: [facility];  Service: Ophthalmology;  Laterality: Left;  CDE 7.89   CATARACT EXTRACTION W/PHACO Right 03/23/2022   Procedure: CATARACT EXTRACTION PHACO AND INTRAOCULAR LENS PLACEMENT (IOC);  Surgeon: Tarri Farm, MD;  Location: [facility];  Service: Ophthalmology;  Laterality:  Right;  CDE: 2.63   CHOLECYSTECTOMY     COLONOSCOPY N/A 09/22/2012   RMR: colonic polyps -removed as described above. tubular adenoma, next TCS 09/2017   COLONOSCOPY WITH PROPOFOL  N/A 09/23/2017   Surgeon: Suzette Espy, MD; internal hemorrhoids, three 5-6 mm polyps removed, diverticulosis in sigmoid and descending colon.  Pathology with tubular adenomas and rectal hyperplastic polyp.  Recommended 5 year surveillance.   COLONOSCOPY WITH PROPOFOL  N/A 11/08/2022   Procedure: COLONOSCOPY WITH PROPOFOL ;  Surgeon: Suzette Espy, MD;  [facility]: [facility];  Service: Endoscopy;  Laterality: N/A;  815am, asa 3   CYST EXCISION Left 07/19/2016   Procedure: CYST REMOVAL LEFT RING FINGER;  Surgeon: Darrin Emerald, MD;  Location: [facility];  Service: Orthopedics;  Laterality: Left;   CYST REMOVAL LEG Right    foot   ESOPHAGOGASTRODUODENOSCOPY N/A 10/21/2014   RMR: Small hiatal hernia; otherwise normal EGD status post passage of a Maloney dilator.    ESOPHAGOGASTRODUODENOSCOPY (EGD) WITH PROPOFOL  N/A 11/08/2022   Procedure: ESOPHAGOGASTRODUODENOSCOPY (EGD) WITH PROPOFOL ;  Surgeon: Suzette Espy, MD;  [facility];  Service: Endoscopy;  Laterality: N/A;   EXCISION MASS UPPER EXTREMETIES Right 04/17/2018   Procedure: EXCISION MASS UPPER EXTREMETIES right ring finger;  Surgeon: Darrin Emerald, MD;  Location: [facility];  Service: Orthopedics;  Laterality: Right;   LUMBAR LAMINECTOMY/DECOMPRESSION MICRODISCECTOMY Bilateral 11/26/2022   Procedure: Microdiscectomy - bilateral - Lumbar four-Lumbar five;  Surgeon: Agustina Aldrich, MD;  Location: [facility];  Service: Neurosurgery;  Laterality: Bilateral;   MALONEY DILATION N/A 10/21/2014   Procedure: Londa Rival DILATION;  Surgeon: Suzette Espy, MD;  [facility];  Service: Endoscopy;  Laterality: N/A;   MALONEY DILATION N/A 11/08/2022   Procedure: Londa Rival DILATION;  Surgeon: Suzette Espy, MD;  [facility];  Service: Endoscopy;   Laterality: N/A;   MIDDLE EAR SURGERY Right    patched hole in ear drum   POLYPECTOMY  09/23/2017   Procedure: POLYPECTOMY;  Surgeon: Suzette Espy, MD;  [facility];  Service: Endoscopy;;  polyp hepatic flexure polyp cs, splenic flexure polyp cs, rectal polyp cs   POLYPECTOMY  11/08/2022   Procedure: POLYPECTOMY;  Surgeon: Suzette Espy, MD;  [facility];  Service: Endoscopy;;   TOTAL ABDOMINAL HYSTERECTOMY      Family History  Problem Relation Age of Onset   Colon cancer Mother        diagnosed age 3, underwent surgical resection, metastatic disease several years later   Diabetes Mother    Heart attack Father    Diabetes Sister    Hypothyroidism Brother    Diabetes Sister    Diabetes Sister    Diabetes Sister    Hypothyroidism Sister  Allergic rhinitis Neg Hx    Angioedema Neg Hx    Asthma Neg Hx    Atopy Neg Hx    Eczema Neg Hx    Immunodeficiency Neg Hx    Urticaria Neg Hx     Social History   Socioeconomic History   Marital status: Divorced    Spouse name: Not on file   Number of children: 2   Years of education: Not on file   Highest education level: Not on file  Occupational History   Not on file  Tobacco Use   Smoking status: Never   Smokeless tobacco: Never  Vaping Use   Vaping status: Never Used  Substance and Sexual Activity   Alcohol use: No    Alcohol/week: 0.0 standard drinks of alcohol   Drug use: No   Sexual activity: Not Currently    Birth control/protection: Surgical    Comment: Hysterectomy  Other Topics Concern   Not on file  Social History Narrative   Lives with grandson (since born)      Enjoys: vacation, travel, concerts      Diet: eats all food groups    Caffeine: diet Pepsi daily    Water :  8 cups daily       Wears seat belt    Does use phone   Smoke Retail buyer in safe area    Social Drivers of Health   Financial Resource Strain: Low Risk  (12/02/2019)   Overall Financial Resource  Strain (CARDIA)    Difficulty of Paying Living Expenses: Not hard at all  Food Insecurity: No Food Insecurity (12/02/2019)   Hunger Vital Sign    Worried About Running Out of Food in the Last Year: Never true    Ran Out of Food in the Last Year: Never true  Transportation Needs: No Transportation Needs (12/02/2019)   PRAPARE - Administrator, Civil Service (Medical): No    Lack of Transportation (Non-Medical): No  Physical Activity: Insufficiently Active (12/02/2019)   Exercise Vital Sign    Days of Exercise per Week: 2 days    Minutes of Exercise per Session: 20 min  Stress: Not on file  Social Connections: Moderately Isolated (12/02/2019)   Social Connection and Isolation Panel [NHANES]    Frequency of Communication with Friends and Family: More than three times a week    Frequency of Social Gatherings with Friends and Family: More than three times a week    Attends Religious Services: More than 4 times per year    Active Member of [employer] or Organizations: No    Attends Banker Meetings: Never    Marital Status: Divorced  Catering manager Violence: Not At Risk (12/02/2019)   Humiliation, Afraid, Rape, and Kick questionnaire    Fear of Current or Ex-Partner: No    Emotionally Abused: No    Physically Abused: No    Sexually Abused: No    Outpatient Medications Prior to Visit  Medication Sig Dispense Refill   Accu-Chek FastClix Lancets MISC TEST FOUR TIMES DAILY 100 each 0   aspirin EC 81 MG tablet Take 81 mg by mouth daily.     benzonatate  (TESSALON ) 100 MG capsule Take 1 capsule (100 mg total) by mouth 3 (three) times daily as needed for cough. 90 capsule 0   cetirizine  (ZYRTEC ) 10 MG tablet Take 1 tablet (10 mg total) by mouth daily. 30 tablet 5   Cholecalciferol  (VITAMIN D -3) 1000 UNITS CAPS Take 1,000 Units  by mouth daily.      Continuous Glucose Sensor (DEXCOM G6 SENSOR) MISC CHANGE SENSOR EVERY 10 DAYS AS DIRECTED. 3 each 5   Continuous Glucose  Transmitter (DEXCOM G6 TRANSMITTER) MISC CHANGE TRANSMITTER EVERY 90 DAYS AS DIRECTED. 1 each 1   diclofenac  Sodium (VOLTAREN ) 1 % GEL APPLY 2 GRAMS TO AFFECTED AREAS 2 TIMES A DAY AS NEEDED 100 g 0   EPINEPHrine  0.3 mg/0.3 mL IJ SOAJ injection Inject 0.3 mg into the muscle as needed for anaphylaxis. 2 each 1   fluticasone  (FLONASE ) 50 MCG/ACT nasal spray Place 2 sprays into both nostrils daily as needed. 16 g 5   glucose blood (ACCU-CHEK GUIDE) test strip test FOUR TIMES DAILY 100 strip 0   HYDROcodone -acetaminophen  (NORCO) 7.5-325 MG tablet Take 1 tablet by mouth 4 (four) times daily as needed.     insulin  aspart (NOVOLOG  FLEXPEN) 100 UNIT/ML FlexPen Inject 20-26 Units into the skin 3 (three) times daily with meals. 60 mL 3   ketoconazole  (NIZORAL ) 2 % cream APPLY TO AFFECTED AREAS TWICE DAILY 60 g 2   LANTUS  SOLOSTAR 100 UNIT/ML Solostar Pen Inject 70 Units into the skin at bedtime. 60 mL 3   levothyroxine  (SYNTHROID ) 75 MCG tablet TAKE ONE (1) TABLET BY MOUTH EVERY DAY BEFORE BREAKFAST 90 tablet 3   LITETOUCH PEN NEEDLES 31G X 8 MM MISC USE FOUR TIMES DAILY 100 each 0   meloxicam  (MOBIC ) 7.5 MG tablet Take 1 tablet (7.5 mg total) by mouth daily. 15 tablet 0   metFORMIN  (GLUCOPHAGE -XR) 500 MG 24 hr tablet Take 1 tablet (500 mg total) by mouth 2 (two) times daily with a meal. 180 tablet 3   olmesartan  (BENICAR ) 40 MG tablet TAKE ONE TABLET (40MG  TOTAL) BY MOUTH DAILY 30 tablet 3   pantoprazole  (PROTONIX ) 40 MG tablet TAKE ONE TABLET (40MG  TOTAL) BY MOUTH DAILY 30 tablet 11   Semaglutide , 1 MG/DOSE, (OZEMPIC , 1 MG/DOSE,) 4 MG/3ML SOPN Inject 1 mg into the skin once a week. 9 mL 3   simvastatin  (ZOCOR ) 10 MG tablet Take 1 tablet (10 mg total) by mouth daily at 6 PM. 90 tablet 1   tiZANidine (ZANAFLEX) 2 MG tablet Take 2 mg by mouth 2 (two) times daily.     VENTOLIN  HFA 108 (90 Base) MCG/ACT inhaler INHALE 4 PUFFS INTO THE LUNGS EVERY 6 HOURS AS NEEDED FOR WHEEZING OR SHORTNESS OF BREATH 18 g 0    vitamin B-12 (CYANOCOBALAMIN ) 500 MCG tablet Take 500 mcg by mouth daily.     pregabalin  (LYRICA ) 100 MG capsule Take 100 mg by mouth daily.     No facility-administered medications prior to visit.    Allergies  Allergen Reactions   Kiwi Extract Anaphylaxis, Swelling and Palpitations   Other Anaphylaxis    Walnuts (avoids all tree nuts)   Statins     Transaminase elevation    ROS Review of Systems  Constitutional:  Negative for chills and fever.  HENT:  Negative for congestion, sinus pressure and sinus pain.   Eyes:  Negative for pain and discharge.  Respiratory:  Negative for cough and shortness of breath.   Cardiovascular:  Negative for chest pain and palpitations.  Gastrointestinal:  Negative for abdominal pain, diarrhea, nausea and vomiting.  Endocrine: Negative for polydipsia and polyuria.  Genitourinary:  Negative for dysuria and hematuria.  Musculoskeletal:  Positive for arthralgias and back pain. Negative for neck pain and neck stiffness.       Bilateral thigh pain  Skin:  Positive for color change (Mole over back).  Neurological:  Negative for dizziness and weakness.  Psychiatric/Behavioral:  Positive for sleep disturbance. Negative for agitation and behavioral problems.       Objective:    Physical Exam Vitals reviewed.  Constitutional:      General: She is not in acute distress.    Appearance: She is obese. She is not diaphoretic.  HENT:     Head: Normocephalic and atraumatic.     Nose: No congestion.     Right Sinus: Frontal sinus tenderness present.     Left Sinus: Frontal sinus tenderness present.     Mouth/Throat:     Mouth: Mucous membranes are moist.     Pharynx: No posterior oropharyngeal erythema.  Eyes:     General: No scleral icterus.    Extraocular Movements: Extraocular movements intact.  Cardiovascular:     Rate and Rhythm: Normal rate and regular rhythm.     Heart sounds: Normal heart sounds. No murmur heard. Pulmonary:     Breath  sounds: Normal breath sounds. No wheezing or rales.  Musculoskeletal:     Cervical back: Neck supple. No tenderness.     Lumbar back: Tenderness present. Positive left straight leg raise test.     Right lower leg: No edema.     Left lower leg: No edema.  Skin:    General: Skin is warm.  Neurological:     General: No focal deficit present.     Mental Status: She is alert and oriented to person, place, and time.     Motor: Weakness (B/l LE - 4/5) present.  Psychiatric:        Mood and Affect: Mood normal.        Behavior: Behavior normal.     BP 131/77   Pulse 79   Ht 5\' 5"  (1.651 m)   Wt 221 lb (100.2 kg)   SpO2 96%   BMI 36.78 kg/m  Wt Readings from Last 3 Encounters:  10/17/23 221 lb (100.2 kg)  10/07/23 217 lb 9.5 oz (98.7 kg)  08/16/23 217 lb 8 oz (98.7 kg)    Lab Results  Component Value Date   TSH 2.300 07/02/2023   Lab Results  Component Value Date   WBC 11.0 (H) 06/01/2023   HGB 12.9 06/01/2023   HCT 41.1 06/01/2023   MCV 89.5 06/01/2023   PLT 282 06/01/2023   Lab Results  Component Value Date   NA 141 07/02/2023   K 4.1 07/02/2023   CO2 24 07/02/2023   GLUCOSE 140 (H) 07/02/2023   BUN 10 07/02/2023   CREATININE 0.73 07/02/2023   BILITOT 0.2 07/02/2023   ALKPHOS 78 07/02/2023   AST 24 07/02/2023   ALT 35 (H) 07/02/2023   PROT 6.6 07/02/2023   ALBUMIN 4.1 07/02/2023   CALCIUM 9.4 07/02/2023   ANIONGAP 11 06/01/2023   EGFR 94 07/02/2023   Lab Results  Component Value Date   CHOL 125 07/02/2023   Lab Results  Component Value Date   HDL 51 07/02/2023   Lab Results  Component Value Date   LDLCALC 59 07/02/2023   Lab Results  Component Value Date   TRIG 74 07/02/2023   Lab Results  Component Value Date   CHOLHDL 2.5 07/02/2023   Lab Results  Component Value Date   HGBA1C 9.5 (A) 07/16/2023      Assessment & Plan:   Problem List Items Addressed This Visit       Cardiovascular and Mediastinum  Essential hypertension -  Primary   BP Readings from Last 1 Encounters:  10/17/23 131/77   Well-controlled with olmesartan  40 mg QD Discontinued lisinopril  due to her chronic cough in the last visit Counseled for compliance with the medications Advised DASH diet and moderate exercise/walking, at least 150 mins/week        Endocrine   Type 2 diabetes mellitus with hyperglycemia (HCC)   Lab Results  Component Value Date   HGBA1C 9.5 (A) 07/16/2023   Uncontrolled, had stopped Ozempic  in berween, has resumed since last HbA1c check Follows up with Endocrinology On Lantus  70 units and NovoLog  ISS Continue metformin  and Ozempic , needs to stay compliant to Ozempic  On statin Takes Lyrica  for neuropathy/low back pain - increased dose to 100 mg BID due to worsening of radicular pain      Relevant Orders   Urine Microalbumin w/creat. ratio   Hypothyroidism   Lab Results  Component Value Date   TSH 2.300 07/02/2023   On levothyroxine  75 mcg QD, followed by Endocrinology        Nervous and Auditory   Lumbar radiculopathy   On Lyrica  and Norco PRN Increased dose of Lyrica  200 mg twice daily due to radicular pain and leg weakness Has meloxicam  15 mg once daily, was prescribed from recent ER visit Referred to PT S/p microdiscectomy L4-5 (06/24) Followed by spine surgery and pain clinic Flexeril  as needed for muscle spasms      Relevant Medications   pregabalin  (LYRICA ) 100 MG capsule   Other Relevant Orders   Ambulatory referral to Physical Therapy     Musculoskeletal and Integument   Primary osteoarthritis of right knee   Recent ER visit for right knee pain, had x-ray of right knee reviewed She was seen by [facility] after it - had steroid injection with mild relief Continue meloxicam  as needed for pain, can alternate with Tylenol  arthritis        Other   Mixed hyperlipidemia   Last lipid profile reviewed Restarted simvastatin  in the last visit as her liver enzymes have normalized now -  check CMP and lipid profile        Meds ordered this encounter  Medications   pregabalin  (LYRICA ) 100 MG capsule    Sig: Take 1 capsule (100 mg total) by mouth 2 (two) times daily.    Dispense:  60 capsule    Refill:  3    Follow-up: Return in about 4 months (around 02/17/2024) for DM and HTN.    Meldon Sport, MD

## 2023-10-17 NOTE — Assessment & Plan Note (Signed)
 Lab Results  Component Value Date   TSH 2.300 07/02/2023   On levothyroxine  75 mcg QD, followed by Endocrinology

## 2023-10-17 NOTE — Assessment & Plan Note (Addendum)
 Lab Results  Component Value Date   HGBA1C 9.5 (A) 07/16/2023   Uncontrolled, had stopped Ozempic  in berween, has resumed since last HbA1c check Follows up with Endocrinology On Lantus  70 units and NovoLog  ISS Continue metformin  and Ozempic , needs to stay compliant to Ozempic  On statin Takes Lyrica  for neuropathy/low back pain - increased dose to 100 mg BID due to worsening of radicular pain

## 2023-10-17 NOTE — Assessment & Plan Note (Signed)
 BP Readings from Last 1 Encounters:  10/17/23 131/77   Well-controlled with olmesartan  40 mg QD Discontinued lisinopril  due to her chronic cough in the last visit Counseled for compliance with the medications Advised DASH diet and moderate exercise/walking, at least 150 mins/week

## 2023-10-18 ENCOUNTER — Other Ambulatory Visit: Payer: Self-pay

## 2023-10-18 ENCOUNTER — Telehealth: Payer: Self-pay

## 2023-10-18 ENCOUNTER — Ambulatory Visit: Payer: Self-pay | Admitting: Internal Medicine

## 2023-10-18 ENCOUNTER — Ambulatory Visit: Admitting: Podiatry

## 2023-10-18 ENCOUNTER — Encounter: Payer: Self-pay | Admitting: Podiatry

## 2023-10-18 DIAGNOSIS — L600 Ingrowing nail: Secondary | ICD-10-CM | POA: Diagnosis not present

## 2023-10-18 DIAGNOSIS — M79675 Pain in left toe(s): Secondary | ICD-10-CM | POA: Diagnosis not present

## 2023-10-18 DIAGNOSIS — M79674 Pain in right toe(s): Secondary | ICD-10-CM

## 2023-10-18 DIAGNOSIS — B351 Tinea unguium: Secondary | ICD-10-CM

## 2023-10-18 DIAGNOSIS — Z780 Asymptomatic menopausal state: Secondary | ICD-10-CM

## 2023-10-18 LAB — CMP14+EGFR
ALT: 24 IU/L (ref 0–32)
AST: 17 IU/L (ref 0–40)
Albumin: 4.6 g/dL (ref 3.9–4.9)
Alkaline Phosphatase: 108 IU/L (ref 44–121)
BUN/Creatinine Ratio: 21 (ref 12–28)
BUN: 16 mg/dL (ref 8–27)
Bilirubin Total: 0.3 mg/dL (ref 0.0–1.2)
CO2: 21 mmol/L (ref 20–29)
Calcium: 9.8 mg/dL (ref 8.7–10.3)
Chloride: 103 mmol/L (ref 96–106)
Creatinine, Ser: 0.77 mg/dL (ref 0.57–1.00)
Globulin, Total: 2.4 g/dL (ref 1.5–4.5)
Glucose: 135 mg/dL — ABNORMAL HIGH (ref 70–99)
Potassium: 4.5 mmol/L (ref 3.5–5.2)
Sodium: 141 mmol/L (ref 134–144)
Total Protein: 7 g/dL (ref 6.0–8.5)
eGFR: 87 mL/min/{1.73_m2} (ref >59)

## 2023-10-18 LAB — LIPID PANEL
Chol/HDL Ratio: 2.3 ratio (ref 0.0–4.4)
Cholesterol, Total: 153 mg/dL (ref 100–199)
HDL: 66 mg/dL (ref >39)
LDL Chol Calc (NIH): 70 mg/dL (ref 0–99)
Triglycerides: 90 mg/dL (ref 0–149)
VLDL Cholesterol Cal: 17 mg/dL (ref 5–40)

## 2023-10-18 NOTE — Progress Notes (Signed)
 Patient presents for evaluation and treatment of tenderness and some redness around nails feet.  Tenderness around toes with walking and wearing shoes.  Physical exam:  General appearance: Alert, pleasant, and in no acute distress.  Vascular: Pedal pulses: DP palpable bilaterally, PT nonpalpable bilaterally.  Minimal edema lower legs bilaterally  Neurological:  No burning or numbness in lower extremity  Dermatologic:  Nails thickened, disfigured, discolored 1-5 BL with subungual debris.  Some redness along nail folds bilaterally but no signs of drainage or infection.  Musculoskeletal:  Toe deformities 2 through 5 bilaterally   Diagnosis: Painful onychomycotic nails 1 through 5 bilaterally. 2. Pain toes 1 through 5 bilaterally. 3. Ingrown nails 1 through 5 bilaterally  Plan: Debrided onychomycotic nails 1 through 5 bilaterally.  Return 3 months

## 2023-10-18 NOTE — Telephone Encounter (Signed)
 Spoke scheduling they have her appt added to the mammogram.

## 2023-10-18 NOTE — Telephone Encounter (Signed)
 Copied from CRM [id number]. Topic: Clinical - Request for Lab/Test Order >> Oct 18, 2023  9:12 AM Everette C wrote: Reason for CRM: The patient has called to request orders for a bone density screening   Please contact the patient further if/when needed

## 2023-10-18 NOTE — Telephone Encounter (Signed)
 Bone density order put in.

## 2023-10-19 LAB — MICROALBUMIN / CREATININE URINE RATIO
Creatinine, Urine: 85.5 mg/dL
Microalb/Creat Ratio: 7 mg/g{creat} (ref 0–29)
Microalbumin, Urine: 6.2 ug/mL

## 2023-10-21 ENCOUNTER — Telehealth: Payer: Self-pay | Admitting: Podiatry

## 2023-10-21 NOTE — Telephone Encounter (Signed)
 Patient is requesting to speak with Elease Grice( Nurse) she stated it is confidential, and is regarding her feet, she has a question. Patient contact telephone number, [phone]

## 2023-10-21 NOTE — Telephone Encounter (Signed)
 Patient called back stating she wanted the nurse to call her. The nurse called and pt states she couldn't answer. Patient would like a call back.

## 2023-10-22 ENCOUNTER — Telehealth: Payer: Self-pay | Admitting: *Deleted

## 2023-10-22 ENCOUNTER — Telehealth: Payer: Self-pay

## 2023-10-22 NOTE — Telephone Encounter (Signed)
 Copied from CRM [id number]. Topic: Clinical - Lab/Test Results >> Oct 22, 2023  4:29 PM Turkey B wrote: Reason for CRM: pt called in for urlne lab results. Please cb when result notes are in

## 2023-10-22 NOTE — Telephone Encounter (Signed)
 Talked with the patient making her aware that her insurance , [employer] will not cover her Dexcom G 6, they reviewed the appeal . The records did not show that this device is helping patient control blood sugars. The copy of this will be scanned into the patient's chart.  Per Laurina Popper , have the patient look at [facility] and let us  know if this may be something that she would be interested in, and the patient is going to this. Meantime she is using a meter,lancets,strips to monitor her blood sugars. She will bring these in with her at the time of her office visit.

## 2023-10-23 NOTE — Telephone Encounter (Signed)
 Patient advised.

## 2023-10-29 ENCOUNTER — Other Ambulatory Visit: Payer: Self-pay | Admitting: Allergy & Immunology

## 2023-10-30 ENCOUNTER — Encounter (INDEPENDENT_AMBULATORY_CARE_PROVIDER_SITE_OTHER): Payer: Self-pay | Admitting: Otolaryngology

## 2023-10-30 ENCOUNTER — Ambulatory Visit (INDEPENDENT_AMBULATORY_CARE_PROVIDER_SITE_OTHER): Admitting: Otolaryngology

## 2023-10-30 VITALS — BP 167/68 | HR 109

## 2023-10-30 DIAGNOSIS — H7011 Chronic mastoiditis, right ear: Secondary | ICD-10-CM

## 2023-10-30 DIAGNOSIS — H903 Sensorineural hearing loss, bilateral: Secondary | ICD-10-CM

## 2023-10-31 DIAGNOSIS — H903 Sensorineural hearing loss, bilateral: Secondary | ICD-10-CM | POA: Insufficient documentation

## 2023-10-31 NOTE — Progress Notes (Signed)
 Patient ID: Sandy Valencia, female   DOB: 05-19-1961, 63 y.o.   MRN: [id number]  Follow-up: Chronic right mastoiditis  HPI: The patient is a 63 year old female who returns today for her follow-up evaluation.  The patient has a history of right chronic otomastoiditis.  She was treated with multiple antibiotics and right canal wall down tympanomastoidectomy surgery.  The patient returns today reporting no significant difficulty since her last visit 6 months ago.  She has a history of bilateral high-frequency sensorineural hearing loss, with additional conductive hearing loss on the right side.  Currently she denies any otalgia, otorrhea, or recent change in her hearing.  Exam: General: Communicates without difficulty, well nourished, no acute distress. Head: Normocephalic, no evidence injury, no tenderness, facial buttresses intact without stepoff. Face/sinus: No tenderness to palpation and percussion. Facial movement is normal and symmetric. Eyes: PERRL, EOMI. No scleral icterus, conjunctivae clear. Neuro: CN II exam reveals vision grossly intact.  No nystagmus at any point of gaze. Ears: Auricles well formed without lesions.  A right mastoid bowl is noted, with a small amount of squamous debris. No evidence of infection or cholesteatoma. Nose: External evaluation reveals normal support and skin without lesions.  Dorsum is intact.  Anterior rhinoscopy reveals congested mucosa over anterior aspect of inferior turbinates and intact septum.  No purulence noted. Oral:  Oral cavity and oropharynx are intact, symmetric, without erythema or edema.  Mucosa is moist without lesions. Neck: Full range of motion without pain.  There is no significant lymphadenopathy.  No masses palpable.  Thyroid  bed within normal limits to palpation.  Parotid glands and submandibular glands equal bilaterally without mass.  Trachea is midline. Neuro:  CN 2-12 grossly intact.     Assessment: 1.  Chronic right mastoiditis, status post  right canal wall down tympanomastoidectomy surgery. 2.  No recurrent infection or cholesteatoma is noted today. 3.  Subjectively stable bilateral sensorineural hearing loss and right ear conductive hearing loss.  Plan: 1.  The physical exam findings are reviewed with the patient. 2.  Continue dry ear precautions on the right side. 3.  The patient will return for reevaluation in 6 months.

## 2023-11-12 ENCOUNTER — Ambulatory Visit: Admitting: Internal Medicine

## 2023-11-12 ENCOUNTER — Encounter: Payer: Self-pay | Admitting: Internal Medicine

## 2023-11-12 VITALS — BP 131/82 | HR 71 | Temp 98.4°F | Ht 65.0 in | Wt 227.8 lb

## 2023-11-12 DIAGNOSIS — R1310 Dysphagia, unspecified: Secondary | ICD-10-CM

## 2023-11-12 DIAGNOSIS — Z860101 Personal history of adenomatous and serrated colon polyps: Secondary | ICD-10-CM | POA: Diagnosis not present

## 2023-11-12 DIAGNOSIS — Z860100 Personal history of colon polyps, unspecified: Secondary | ICD-10-CM

## 2023-11-12 DIAGNOSIS — K219 Gastro-esophageal reflux disease without esophagitis: Secondary | ICD-10-CM | POA: Diagnosis not present

## 2023-11-12 NOTE — Progress Notes (Signed)
 Primary Care Physician:  Meldon Sport, MD Primary Gastroenterologist:  Dr. Riley Cheadle  Pre-Procedure History & Physical: HPI:  Sandy Valencia is a 63 y.o. female here for follow-up of GERD.  History of multiple colonic adenomas removed last year due for surveillance exam 2029;8 underwent esophageal dilation last year due to dysphagia.  Normal esophagus.  Patient states dysphagia has resolved.  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   Anaphylactic shock, unspecified, subsequent encounter    Anemia, iron deficiency 02/14/2015   patient is not aware this dx   Angio-edema    Arthritis    Asthma    Mild intermittent asthma   Asymptomatic varicose veins of right lower extremity    Chronic obstructive pulmonary disease with (acute) exacerbation (HCC)    patient denies this dx   Chronic rhinitis 05/21/2018   COVID-19 2019   Diabetes mellitus    type 2   Essential (primary) hypertension    Family history of colon cancer 09/03/2012   Fatty liver    Gastro-esophageal reflux disease without esophagitis    GERD (gastroesophageal reflux disease)    Hearing loss    wears hearing aid   History of colonic polyps 08/26/2017   Hypercholesteremia    Hypertension    Hypothyroidism    Low back pain    and left leg pain   Mass of finger, right s/p surgical excision (mucoid cyst) 04/17/18 04/22/2018   Mucous cyst of digit of right hand    Obesity, unspecified    Other adverse food reactions, not elsewhere classified, initial encounter    Personal history of noncompliance with medical treatment, presenting hazards to health 05/18/2015   Rash and other nonspecific skin eruption    Restless legs syndrome    patient denies this dx   Sleep apnea    pt siad, i had small amount but could not tolerate CPAP and they said it was ok since it was mild.   Unspecified asthma,  uncomplicated    Viral URI 07/13/2022   Zoster without complications     Past Surgical History:  Procedure Laterality Date   ABDOMINAL HYSTERECTOMY     BACK SURGERY  11/26/2022   BIOPSY  11/08/2022   Procedure: BIOPSY;  Surgeon: Suzette Espy, MD;  Location: [facility];  Service: Endoscopy;;   CATARACT EXTRACTION W/PHACO Left 03/09/2022   Procedure: CATARACT EXTRACTION PHACO AND INTRAOCULAR LENS PLACEMENT (IOC);  Surgeon: Tarri Farm, MD;  Location: [facility];  Service: Ophthalmology;  Laterality: Left;  CDE 7.89   CATARACT EXTRACTION W/PHACO Right 03/23/2022   Procedure: CATARACT EXTRACTION PHACO AND INTRAOCULAR LENS PLACEMENT (IOC);  Surgeon: Tarri Farm, MD;  Location: [facility];  Service: Ophthalmology;  Laterality: Right;  CDE: 2.63   CHOLECYSTECTOMY     COLONOSCOPY N/A 09/22/2012   RMR: colonic polyps -removed as described above. tubular adenoma, next TCS 09/2017   COLONOSCOPY WITH PROPOFOL  N/A 09/23/2017   Surgeon: Suzette Espy, MD; internal hemorrhoids, three 5-6 mm polyps removed, diverticulosis in sigmoid and descending colon.  Pathology with tubular adenomas and rectal hyperplastic polyp.  Recommended 5 year surveillance.   COLONOSCOPY WITH PROPOFOL  N/A 11/08/2022   Procedure: COLONOSCOPY WITH PROPOFOL ;  Surgeon: Suzette Espy, MD;  Location: [facility];  Service: Endoscopy;  Laterality: N/A;  815am, asa 3   CYST EXCISION Left 07/19/2016  Procedure: CYST REMOVAL LEFT RING FINGER;  Surgeon: Darrin Emerald, MD;  Location: [facility];  Service: Orthopedics;  Laterality: Left;   CYST REMOVAL LEG Right    foot   ESOPHAGOGASTRODUODENOSCOPY N/A 10/21/2014   RMR: Small hiatal hernia; otherwise normal EGD status post passage of a Maloney dilator.    ESOPHAGOGASTRODUODENOSCOPY (EGD) WITH PROPOFOL  N/A 11/08/2022   Procedure: ESOPHAGOGASTRODUODENOSCOPY (EGD) WITH PROPOFOL ;  Surgeon: Suzette Espy, MD;  [facility]: [facility];  Service: Endoscopy;  Laterality: N/A;    EXCISION MASS UPPER EXTREMETIES Right 04/17/2018   Procedure: EXCISION MASS UPPER EXTREMETIES right ring finger;  Surgeon: Darrin Emerald, MD;  Location: [facility];  Service: Orthopedics;  Laterality: Right;   LUMBAR LAMINECTOMY/DECOMPRESSION MICRODISCECTOMY Bilateral 11/26/2022   Procedure: Microdiscectomy - bilateral - Lumbar four-Lumbar five;  Surgeon: Agustina Aldrich, MD;  [facility];  Service: Neurosurgery;  Laterality: Bilateral;   MALONEY DILATION N/A 10/21/2014   Procedure: Londa Rival DILATION;  Surgeon: Suzette Espy, MD;  Location: [facility];  Service: Endoscopy;  Laterality: N/A;   MALONEY DILATION N/A 11/08/2022   Procedure: Londa Rival DILATION;  Surgeon: Suzette Espy, MD;  [facility]: [facility];  Service: Endoscopy;  Laterality: N/A;   MIDDLE EAR SURGERY Right    patched hole in ear drum   POLYPECTOMY  09/23/2017   Procedure: POLYPECTOMY;  Surgeon: Suzette Espy, MD;  Location: [facility];  Service: Endoscopy;;  polyp hepatic flexure polyp cs, splenic flexure polyp cs, rectal polyp cs   POLYPECTOMY  11/08/2022   Procedure: POLYPECTOMY;  Surgeon: Suzette Espy, MD;  Location: [facility];  Service: Endoscopy;;   TOTAL ABDOMINAL HYSTERECTOMY      Prior to Admission medications   Medication Sig Start Date End Date Taking? Authorizing Provider  Accu-Chek FastClix Lancets MISC TEST FOUR TIMES DAILY 03/01/23  Yes Meldon Sport, MD  albuterol  (VENTOLIN  HFA) 108 (90 Base) MCG/ACT inhaler INHALE 4 PUFFS INTO THE LUNGS EVERY 6 HOURS AS NEEDED FOR WHEEZING OR SHORTNESS OF BREATH 10/29/23  Yes Rochester Chuck, MD  aspirin EC 81 MG tablet Take 81 mg by mouth daily.   Yes [provider]  cetirizine  (ZYRTEC ) 10 MG tablet Take 1 tablet (10 mg total) by mouth daily. 05/26/23  Yes Ambs, Jeanmarie Millet, FNP  Cholecalciferol  (VITAMIN D -3) 1000 UNITS CAPS Take 1,000 Units by mouth daily.    Yes [provider]  cyclobenzaprine  (FLEXERIL ) 10 MG tablet Take 10 mg by  mouth at bedtime. 09/11/23  Yes [provider]  diclofenac  Sodium (VOLTAREN ) 1 % GEL APPLY 2 GRAMS TO AFFECTED AREAS 2 TIMES A DAY AS NEEDED 04/05/23  Yes Meldon Sport, MD  EPINEPHrine  0.3 mg/0.3 mL IJ SOAJ injection Inject 0.3 mg into the muscle as needed for anaphylaxis. 08/16/23  Yes Rochester Chuck, MD  fluticasone  (FLONASE ) 50 MCG/ACT nasal spray Place 2 sprays into both nostrils daily as needed. 05/26/23  Yes Ambs, Jeanmarie Millet, FNP  glucose blood (ACCU-CHEK GUIDE) test strip test FOUR TIMES DAILY 03/01/23  Yes Meldon Sport, MD  HYDROcodone -acetaminophen  (NORCO) 7.5-325 MG tablet Take 1 tablet by mouth 4 (four) times daily as needed. 07/31/23  Yes [provider]  insulin  aspart (NOVOLOG  FLEXPEN) 100 UNIT/ML FlexPen Inject 20-26 Units into the skin 3 (three) times daily with meals. 03/05/23  Yes Wendel Hals, NP  ketoconazole  (NIZORAL ) 2 % cream APPLY TO AFFECTED AREAS TWICE DAILY 09/02/23  Yes Meldon Sport, MD  LANTUS  Reubin Castillo  100 UNIT/ML Solostar Pen Inject 70 Units into the skin at bedtime. 03/05/23  Yes Wendel Hals, NP  levothyroxine  (SYNTHROID ) 75 MCG tablet TAKE ONE (1) TABLET BY MOUTH EVERY DAY BEFORE BREAKFAST 03/05/23  Yes Wendel Hals, NP  LITETOUCH PEN NEEDLES 31G X 8 MM MISC USE FOUR TIMES DAILY 03/01/23  Yes Meldon Sport, MD  metFORMIN  (GLUCOPHAGE -XR) 500 MG 24 hr tablet Take 1 tablet (500 mg total) by mouth 2 (two) times daily with a meal. 03/05/23  Yes Reardon, Arminda Landmark, NP  olmesartan  (BENICAR ) 40 MG tablet TAKE ONE TABLET (40MG  TOTAL) BY MOUTH DAILY 10/04/23  Yes Meldon Sport, MD  pantoprazole  (PROTONIX ) 40 MG tablet TAKE ONE TABLET (40MG  TOTAL) BY MOUTH DAILY 05/24/23  Yes Rochester Chuck, MD  pregabalin  (LYRICA ) 100 MG capsule Take 1 capsule (100 mg total) by mouth 2 (two) times daily. 10/17/23  Yes Meldon Sport, MD  Semaglutide , 1 MG/DOSE, (OZEMPIC , 1 MG/DOSE,) 4 MG/3ML SOPN Inject 1 mg into the skin once a week. 03/05/23  Yes  Wendel Hals, NP  simvastatin  (ZOCOR ) 10 MG tablet Take 1 tablet (10 mg total) by mouth daily at 6 PM. 06/19/23  Yes Meldon Sport, MD  vitamin B-12 (CYANOCOBALAMIN ) 500 MCG tablet Take 500 mcg by mouth daily.   Yes [provider]    Allergies as of 11/12/2023 - Review Complete 11/12/2023  Allergen Reaction Noted   Kiwi extract Anaphylaxis, Swelling, and Palpitations 04/07/2018   Other Anaphylaxis 04/22/2018   Statins  07/20/2022    Family History  Problem Relation Age of Onset   Colon cancer Mother        diagnosed age 41, underwent surgical resection, metastatic disease several years later   Diabetes Mother    Heart attack Father    Diabetes Sister    Hypothyroidism Brother    Diabetes Sister    Diabetes Sister    Diabetes Sister    Hypothyroidism Sister    Allergic rhinitis Neg Hx    Angioedema Neg Hx    Asthma Neg Hx    Atopy Neg Hx    Eczema Neg Hx    Immunodeficiency Neg Hx    Urticaria Neg Hx     Social History   Socioeconomic History   Marital status: Divorced    Spouse name: Not on file   Number of children: 2   Years of education: Not on file   Highest education level: Not on file  Occupational History   Not on file  Tobacco Use   Smoking status: Never   Smokeless tobacco: Never  Vaping Use   Vaping status: Never Used  Substance and Sexual Activity   Alcohol use: No    Alcohol/week: 0.0 standard drinks of alcohol   Drug use: No   Sexual activity: Not Currently    Birth control/protection: Surgical    Comment: Hysterectomy  Other Topics Concern   Not on file  Social History Narrative   Lives with grandson (since born)      Enjoys: vacation, travel, concerts      Diet: eats all food groups    Caffeine: diet Pepsi daily    Water :  8 cups daily       Wears seat belt    Does use phone   Smoke Retail buyer in safe area    Social Drivers of Health   Financial Resource Strain: Low Risk  (12/02/2019)   Overall  Physicist, medical Strain (  CARDIA)    Difficulty of Paying Living Expenses: Not hard at all  Food Insecurity: No Food Insecurity (12/02/2019)   Hunger Vital Sign    Worried About Running Out of Food in the Last Year: Never true    Ran Out of Food in the Last Year: Never true  Transportation Needs: No Transportation Needs (12/02/2019)   PRAPARE - Administrator, Civil Service (Medical): No    Lack of Transportation (Non-Medical): No  Physical Activity: Insufficiently Active (12/02/2019)   Exercise Vital Sign    Days of Exercise per Week: 2 days    Minutes of Exercise per Session: 20 min  Stress: Not on file  Social Connections: Moderately Isolated (12/02/2019)   Social Connection and Isolation Panel [NHANES]    Frequency of Communication with Friends and Family: More than three times a week    Frequency of Social Gatherings with Friends and Family: More than three times a week    Attends Religious Services: More than 4 times per year    Active Member of [employer] or Organizations: No    Attends Banker Meetings: Never    Marital Status: Divorced  Catering manager Violence: Not At Risk (12/02/2019)   Humiliation, Afraid, Rape, and Kick questionnaire    Fear of Current or Ex-Partner: No    Emotionally Abused: No    Physically Abused: No    Sexually Abused: No    Review of Systems: See HPI, otherwise negative ROS  Physical Exam: BP 131/82 (BP Location: Right Arm, Patient Position: Sitting, Cuff Size: Large)   Pulse 71   Temp 98.4 F (36.9 C) (Oral)   Ht 5' 5 (1.651 m)   Wt 227 lb 12.8 oz (103.3 kg)   SpO2 96%   BMI 37.91 kg/m  General:   Alert,  Well-developed, well-nourished, pleasant and cooperative in NAD Heart:  Regular rate and rhythm; no murmurs, clicks, rubs,  or gallops. Abdomen: Centrally obese.  Soft and nontender without appreciable mass or hepatosplenomegaly.   Impression/Plan: 63 year old lady with a history of GERD dysphagia.  Dysphagia  resolved status post Maloney dilation with normal-appearing esophagus.  Reflux well-controlled on once daily PPI therapy.  History of colonic adenomas; due for surveillance 2029.  Recommendations:  Continue Protonix  or pantoprazole  40 mg daily  You are not due for colonoscopy until 2029  Again in 1 year.  Notice: This dictation was prepared with Dragon dictation along with smaller phrase technology. Any transcriptional errors that result from this process are unintentional and may not be corrected upon review.

## 2023-11-12 NOTE — Patient Instructions (Signed)
 It was good to see you again today!  Continue Protonix  or pantoprazole  40 mg daily  You are not due for colonoscopy until 2029  Will plan to see you back in 1 year  If you have any problems that come up within the next year please do not hesitate to call me

## 2023-11-13 ENCOUNTER — Ambulatory Visit: Payer: Medicaid Other | Admitting: Nurse Practitioner

## 2023-11-21 ENCOUNTER — Encounter (HOSPITAL_COMMUNITY): Payer: Self-pay

## 2023-11-21 ENCOUNTER — Other Ambulatory Visit: Payer: Self-pay

## 2023-11-21 ENCOUNTER — Ambulatory Visit (HOSPITAL_COMMUNITY): Attending: Student

## 2023-11-21 DIAGNOSIS — M5416 Radiculopathy, lumbar region: Secondary | ICD-10-CM | POA: Diagnosis not present

## 2023-11-21 DIAGNOSIS — M5459 Other low back pain: Secondary | ICD-10-CM | POA: Insufficient documentation

## 2023-11-21 NOTE — Therapy (Signed)
 OUTPATIENT PHYSICAL THERAPY THORACOLUMBAR EVALUATION/DISCHARGE   Patient Name: Sandy Valencia MRN: [id number] DOB:[birth year], 62 y.o., female Today's Date: 11/21/2023  END OF SESSION:  PT End of Session - 11/21/23 1142     Visit Number 1    Number of Visits 1    Date for PT Re-Evaluation --    Authorization Type [place] [employer]    Authorization Time Period --    PT Start Time 1145    PT Stop Time 1213    PT Time Calculation (min) 28 min    Activity Tolerance Patient tolerated treatment well    Behavior During Therapy WFL for tasks assessed/performed          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   Anaphylactic shock, unspecified, subsequent encounter    Anemia, iron deficiency 02/14/2015   patient is not aware this dx   Angio-edema    Arthritis    Asthma    Mild intermittent asthma   Asymptomatic varicose veins of right lower extremity    Chronic obstructive pulmonary disease with (acute) exacerbation (HCC)    patient denies this dx   Chronic rhinitis 05/21/2018   COVID-19 2019   Diabetes mellitus    type 2   Essential (primary) hypertension    Family history of colon cancer 09/03/2012   Fatty liver    Gastro-esophageal reflux disease without esophagitis    GERD (gastroesophageal reflux disease)    Hearing loss    wears hearing aid   History of colonic polyps 08/26/2017   Hypercholesteremia    Hypertension    Hypothyroidism    Low back pain    and left leg pain   Mass of finger, right s/p surgical excision (mucoid cyst) 04/17/18 04/22/2018   Mucous cyst of digit of right hand    Obesity, unspecified    Other adverse food reactions, not elsewhere classified, initial encounter    Personal history of noncompliance with medical treatment, presenting hazards to health 05/18/2015   Rash and other nonspecific skin eruption    Restless legs syndrome    patient  denies this dx   Sleep apnea    pt siad, i had small amount but could not tolerate CPAP and they said it was ok since it was mild.   Unspecified asthma, uncomplicated    Viral URI 07/13/2022   Zoster without complications    Past Surgical History:  Procedure Laterality Date   ABDOMINAL HYSTERECTOMY     BACK SURGERY  11/26/2022   BIOPSY  11/08/2022   Procedure: BIOPSY;  Surgeon: Suzette Espy, MD;  [facility];  Service: Endoscopy;;   CATARACT EXTRACTION W/PHACO Left 03/09/2022   Procedure: CATARACT EXTRACTION PHACO AND INTRAOCULAR LENS PLACEMENT (IOC);  Surgeon: Tarri Farm, MD;  Location: [facility];  Service: Ophthalmology;  Laterality: Left;  CDE 7.89   CATARACT EXTRACTION W/PHACO Right 03/23/2022   Procedure: CATARACT EXTRACTION PHACO AND INTRAOCULAR LENS PLACEMENT (IOC);  Surgeon: Tarri Farm, MD;  Location: [facility];  Service: Ophthalmology;  Laterality: Right;  CDE: 2.63   CHOLECYSTECTOMY     COLONOSCOPY N/A 09/22/2012   RMR: colonic polyps -removed as described above. tubular adenoma, next TCS 09/2017   COLONOSCOPY WITH PROPOFOL  N/A 09/23/2017   Surgeon: Suzette Espy, MD; internal hemorrhoids, three 5-6 mm polyps removed, diverticulosis in sigmoid and descending colon.  Pathology with tubular adenomas and rectal  hyperplastic polyp.  Recommended 5 year surveillance.   COLONOSCOPY WITH PROPOFOL  N/A 11/08/2022   Procedure: COLONOSCOPY WITH PROPOFOL ;  Surgeon: Suzette Espy, MD;  [facility];  Service: Endoscopy;  Laterality: N/A;  815am, asa 3   CYST EXCISION Left 07/19/2016   Procedure: CYST REMOVAL LEFT RING FINGER;  Surgeon: Darrin Emerald, MD;  Location: [facility];  Service: Orthopedics;  Laterality: Left;   CYST REMOVAL LEG Right    foot   ESOPHAGOGASTRODUODENOSCOPY N/A 10/21/2014   RMR: Small hiatal hernia; otherwise normal EGD status post passage of a Maloney dilator.    ESOPHAGOGASTRODUODENOSCOPY (EGD) WITH PROPOFOL  N/A 11/08/2022    Procedure: ESOPHAGOGASTRODUODENOSCOPY (EGD) WITH PROPOFOL ;  Surgeon: Suzette Espy, MD;  [facility];  Service: Endoscopy;  Laterality: N/A;   EXCISION MASS UPPER EXTREMETIES Right 04/17/2018   Procedure: EXCISION MASS UPPER EXTREMETIES right ring finger;  Surgeon: Darrin Emerald, MD;  Location: [facility];  Service: Orthopedics;  Laterality: Right;   LUMBAR LAMINECTOMY/DECOMPRESSION MICRODISCECTOMY Bilateral 11/26/2022   Procedure: Microdiscectomy - bilateral - Lumbar four-Lumbar five;  Surgeon: Agustina Aldrich, MD;  [facility];  Service: Neurosurgery;  Laterality: Bilateral;   MALONEY DILATION N/A 10/21/2014   Procedure: Londa Rival DILATION;  Surgeon: Suzette Espy, MD;  [facility];  Service: Endoscopy;  Laterality: N/A;   MALONEY DILATION N/A 11/08/2022   Procedure: Londa Rival DILATION;  Surgeon: Suzette Espy, MD;  [facility];  Service: Endoscopy;  Laterality: N/A;   MIDDLE EAR SURGERY Right    patched hole in ear drum   POLYPECTOMY  09/23/2017   Procedure: POLYPECTOMY;  Surgeon: Suzette Espy, MD;  [facility];  Service: Endoscopy;;  polyp hepatic flexure polyp cs, splenic flexure polyp cs, rectal polyp cs   POLYPECTOMY  11/08/2022   Procedure: POLYPECTOMY;  Surgeon: Suzette Espy, MD;  [facility];  Service: Endoscopy;;   TOTAL ABDOMINAL HYSTERECTOMY     Patient Active Problem List   Diagnosis Date Noted   Sensorineural hearing loss, bilateral 10/31/2023   Primary osteoarthritis of right knee 10/17/2023   Acute cystitis without hematuria 06/19/2023   Acute bronchitis 05/24/2023   Chronic mastoiditis of right side 03/24/2023   Varicose veins of right lower extremity 01/22/2023   Acute pain of left lower extremity 10/25/2022   Primary insomnia 09/18/2022   Encounter for examination following treatment at hospital 09/18/2022   Acute non-recurrent frontal sinusitis 07/20/2022   Lumbar radiculopathy 03/08/2022   Statin  intolerance 01/19/2022   Encounter for general adult medical examination with abnormal findings 01/16/2022   Lumbar spondylosis 09/05/2021   Allergic rhinitis due to allergen 04/17/2021   Seborrheic keratosis 04/17/2021   Primary osteoarthritis involving multiple joints 12/02/2019   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   Long-term current use of opiate analgesic 09/08/2019   Neck pain 09/07/2019   Mild intermittent asthma without complication 05/21/2018   Gastroesophageal reflux disease 05/21/2018   History of colonic polyps 08/26/2017   Hypothyroidism 04/22/2017   Mixed hyperlipidemia 12/31/2016   Type 2 diabetes mellitus with hyperglycemia (HCC) 05/18/2015   Essential hypertension 05/18/2015   Morbid obesity (HCC) 05/18/2015   Fatty liver 02/14/2015   Hiatal hernia    Dysphagia 10/11/2014    PCP: Meldon Sport, MD  REFERRING PROVIDER: Meldon Sport, MD  REFERRING DIAG: M54.16 (ICD-10-CM) - Lumbar radiculopathy  Rationale for Evaluation and Treatment: Rehabilitation  THERAPY DIAG:  Other low back pain  ONSET DATE: May 2025  SUBJECTIVE:                                                                                                                                                                                           SUBJECTIVE STATEMENT: Patient reports she went to [place] since last episode of care where she did a lot of walking everyday. Reports they said she had a bad flare up of arthritis in low back and knees. Reports she couldn't walk for five days. Went to [place] a shot in her R knee on May 10th that helped with knee pain. Started doing her HEP and daily walking and usual errands. Right now she's doing good, with no complaints of pain.  PERTINENT HISTORY:    PAIN:  Are you having pain? Yes: NPRS scale: 2/10 Pain location: L side LBP Pain description: very little ache Aggravating factors: Walking prolonged distance Relieving factors:  Rest, changing positions/leaning over  PRECAUTIONS: None  RED FLAGS: None   WEIGHT BEARING RESTRICTIONS: No  FALLS:  Has patient fallen in last 6 months? No   OCCUPATION: Part-Time, PCA  PLOF: Independent  PATIENT GOALS: I have little aches and pains here and there but nothing to complain about  NEXT MD VISIT: 3-4 months  OBJECTIVE:  Note: Objective measures were completed at Evaluation unless otherwise noted.  DIAGNOSTIC FINDINGS:  Disc levels:   T12- L1: Unremarkable.   L1-L2: Ventral spondylitic spurring   L2-L3: Ventral spondylitic spurring. Chronic small right foraminal protrusion but patent canal and foramina   L3-L4: Disc narrowing and bulging with biforaminal herniation, upward pointing and larger at the left foramen where there is also greater facet spurring and foraminal impingement. Patent spinal canal   L4-L5: Disc narrowing and bulging with shallow central protrusion, significantly decreased on axial images after decompression. Mild bilateral foraminal narrowing.   L5-S1:Mild facet spurring.   IMPRESSION: 1. Uncomplicated interval L4-5 micro-discectomy with resolved neural compression. 2. Unchanged L3-4 left foraminal impingement due to herniation and asymmetric facet spurring. 3. No new herniation or affected level.  PATIENT SURVEYS:  Modified Oswestry Low Back Pain Disability Questionnaire: 10 / 50 = 20.0 %  COGNITION: Overall cognitive status: Within functional limits for tasks assessed     SENSATION: WFL   POSTURE: rounded shoulders  PALPATION:   LUMBAR ROM:   AROM eval  Flexion WNL  Extension 50% avail  Right lateral flexion WFL  Left lateral flexion WFL  Right rotation WFL  Left rotation WFL   (Blank rows = not tested)  LOWER EXTREMITY ROM:     Active  Right eval Left eval  Hip  flexion    Hip extension    Hip abduction    Hip adduction    Hip internal rotation    Hip external rotation    Knee flexion     Knee extension    Ankle dorsiflexion    Ankle plantarflexion    Ankle inversion    Ankle eversion     (Blank rows = not tested)  LOWER EXTREMITY MMT:    MMT Right eval Left eval  Hip flexion 4 4+  Hip extension    Hip abduction 4+ 4+  Hip adduction 4 4  Hip internal rotation    Hip external rotation    Knee flexion 4+ 4+  Knee extension 4+ 4+  Ankle dorsiflexion 5 5  Ankle plantarflexion    Ankle inversion    Ankle eversion     (Blank rows = not tested)  LUMBAR SPECIAL TESTS:    FUNCTIONAL TESTS:  30 seconds chair stand test 2 minute walk test: 494 ft   30 seconds chair stand test: 13 STS, no UE use  30 Second Chair Stand NORMS: WOMEN Age group (years) Figures represent: Below Average; Average; Above Average.  60 - 64: < 12; 12 - 17; >17 65 - 69: < 11; 11 - 16; >16 70 - 74: < 10; 10 - 15; >15 75 - 79: < 10; 10 - 15; >15 80 - 84: < 9; 9 - 14; >14 85 - 89: < 8; 8 - 13; >13 90 - 94: < 4; 4 - 11; >11   2 Minute walk test: Women 60-69 184.5   GAIT: Distance walked: 494 ft Assistive device utilized: None Level of assistance: Complete Independence Comments: Good speed, mildly off balance when navigating obstacles but of no concern this date.   TREATMENT DATE:  11/21/23: PT Eval                                                                                                                           PATIENT EDUCATION:  Education details: PT evaluation, objective findings, POC, Importance of HEP, Precautions, Clinic policies  Person educated: Patient Education method: Explanation and Demonstration Education comprehension: verbalized understanding and returned demonstration  HOME EXERCISE PROGRAM: Pt with previous HEP from last episode  ASSESSMENT:  CLINICAL IMPRESSION: Patient is a 63 y.o. female who was seen today for physical therapy evaluation and treatment for M54.16 (ICD-10-CM) - Lumbar radiculopathy. Patient arrives for PT evaluation on this date  with reports of little to no pain at this point. Reports she did have increased pain back in May but has since resolved, although she does have some discomfort with prolonged ambulation. Patient demonstrates good lumbar ROM, good LE strength, and scores well with functional tests this date. Patient encouraged to continue previous HEP, daily walks, and staying generally active. Patient does not present with urgent need for skilled physical therapy at this time.    OBJECTIVE IMPAIRMENTS: decreased ROM and pain.   ACTIVITY LIMITATIONS: N/A  PARTICIPATION LIMITATIONS: N/A  PERSONAL FACTORS: N/A are also affecting patient's functional outcome.   REHAB POTENTIAL: Good  CLINICAL DECISION MAKING: Stable/uncomplicated  EVALUATION COMPLEXITY: Low   GOALS: Goals reviewed with patient? No  SHORT TERM GOALS: Target date: N/A   LONG TERM GOALS: N/A   PLAN:  PT FREQUENCY: one time visit  PT DURATION: other: one time visit  PLANNED INTERVENTIONS: [id number]- PT Re-evaluation and Patient/Family education.  PLAN FOR NEXT SESSION: Discharged.    Patient does not present with urgent need for skilled physical therapy at this time.     12:35 PM, 11/21/23 Marysue Sola, PT, DPT [facility] [place]

## 2023-11-25 ENCOUNTER — Ambulatory Visit

## 2023-11-25 ENCOUNTER — Ambulatory Visit: Payer: Self-pay

## 2023-11-25 VITALS — BP 123/61 | HR 103 | Ht 65.0 in | Wt 224.6 lb

## 2023-11-25 DIAGNOSIS — W57XXXA Bitten or stung by nonvenomous insect and other nonvenomous arthropods, initial encounter: Secondary | ICD-10-CM

## 2023-11-25 DIAGNOSIS — R42 Dizziness and giddiness: Secondary | ICD-10-CM | POA: Diagnosis not present

## 2023-11-25 DIAGNOSIS — S0006XA Insect bite (nonvenomous) of scalp, initial encounter: Secondary | ICD-10-CM

## 2023-11-25 MED ORDER — DOXYCYCLINE HYCLATE 100 MG PO TABS: 100.0000 mg | ORAL_TABLET | Freq: Two times a day (BID) | ORAL | 0 refills | Status: AC

## 2023-11-25 NOTE — Progress Notes (Unsigned)
 Established Patient Office Visit  Subjective   Patient ID: Sandy Valencia, female    DOB: 08-01-1960  Age: 63 y.o. MRN: [id number]  Chief Complaint  Patient presents with   Tick Removal    Since removing tick ,having dizziness and diarrhea. Tick removed one week ago     HPI  Patient Active Problem List   Diagnosis Date Noted   Sensorineural hearing loss, bilateral 10/31/2023   Primary osteoarthritis of right knee 10/17/2023   Acute cystitis without hematuria 06/19/2023   Acute bronchitis 05/24/2023   Chronic mastoiditis of right side 03/24/2023   Varicose veins of right lower extremity 01/22/2023   Acute pain of left lower extremity 10/25/2022   Primary insomnia 09/18/2022   Encounter for examination following treatment at hospital 09/18/2022   Acute non-recurrent frontal sinusitis 07/20/2022   Lumbar radiculopathy 03/08/2022   Statin intolerance 01/19/2022   Encounter for general adult medical examination with abnormal findings 01/16/2022   Lumbar spondylosis 09/05/2021   Allergic rhinitis due to allergen 04/17/2021   Seborrheic keratosis 04/17/2021   Primary osteoarthritis involving multiple joints 12/02/2019   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   Long-term current use of opiate analgesic 09/08/2019   Neck pain 09/07/2019   Mild intermittent asthma without complication 05/21/2018   Gastroesophageal reflux disease 05/21/2018   History of colonic polyps 08/26/2017   Hypothyroidism 04/22/2017   Mixed hyperlipidemia 12/31/2016   Type 2 diabetes mellitus with hyperglycemia (HCC) 05/18/2015   Essential hypertension 05/18/2015   Morbid obesity (HCC) 05/18/2015   Fatty liver 02/14/2015   Hiatal hernia    Dysphagia 10/11/2014      ROS    Objective:     BP 123/61   Pulse (!) 103   Ht 5' 5 (1.651 m)   Wt 224 lb 9.6 oz (101.9 kg)   SpO2 98%   BMI 37.38 kg/m  BP Readings from Last 3 Encounters:  11/25/23 123/61  11/12/23 131/82   10/30/23 (!) 167/68   Wt Readings from Last 3 Encounters:  11/25/23 224 lb 9.6 oz (101.9 kg)  11/12/23 227 lb 12.8 oz (103.3 kg)  10/17/23 221 lb (100.2 kg)      Physical Exam Vitals and nursing note reviewed.  Constitutional:      Appearance: Normal appearance. She is obese.   Eyes:     Extraocular Movements: Extraocular movements intact.     Pupils: Pupils are equal, round, and reactive to light.    Cardiovascular:     Rate and Rhythm: Normal rate and regular rhythm.  Pulmonary:     Effort: Pulmonary effort is normal.     Breath sounds: Normal breath sounds.   Musculoskeletal:     Cervical back: Normal range of motion and neck supple.   Skin:    Findings: Erythema (rt sided base of scalp) present.   Neurological:     Mental Status: She is alert and oriented to person, place, and time.   Psychiatric:        Mood and Affect: Mood normal.        Thought Content: Thought content normal.       The 10-year ASCVD risk score (Arnett DK, et al., 2019) is: 7.2%    Assessment & Plan:   Problem List Items Addressed This Visit   None Visit Diagnoses       Tick bite of scalp, initial encounter    -  Primary   Add oral doxycycline for known tick bite.  Recommend using tick repellent or wearing long sleeves and pants when outside.   Relevant Medications   doxycycline (VIBRA-TABS) 100 MG tablet     Dizziness       Unremarkable neuro exam in office.  States she feels like she is dehydrated.  Check labs for further evaluation.  Recommend increase fluid intake.   Relevant Orders   Basic Metabolic Panel (BMET) (Completed)       No follow-ups on file.    Leita Longs, FNP

## 2023-11-25 NOTE — Telephone Encounter (Signed)
 FYI Only or Action Required?: Action required by provider: clinical question for provider.  Patient was last seen in primary care on 10/17/2023 by Tobie Suzzane POUR, MD. Called Nurse Triage reporting Tick Removal. Symptoms began several days ago. Interventions attempted: Nothing. Symptoms are: stable. Removed tick 5-6 days ago. Has dizziness and diarrhea.  Triage Disposition: Call PCP Within 24 Hours  Patient/caregiver understands and will follow disposition?: YesCopied from CRM [id number]. Topic: Clinical - Red Word Triage >> Nov 25, 2023 12:14 PM Sandy Valencia wrote: Red Word that prompted transfer to Nurse Triage: Got bite by a tick behind ear 5 or 6 days ago. Patient removed the tick but is having dizzy spells and diarrhea. Has been getting hot and feels like she gonna pass out. She stated she could be dehydrated but not sure. Answer Assessment - Initial Assessment Questions 1. ATTACHED:  Is the tick still on the skin?  (e.g., yes, no, unsure)     no 2. ONSET - TICK STILL ATTACHED:  How long do you think the tick has been on your skin? (e.g., hours, days, unsure)  Note:  Is there a recent activity (camping, hiking) where the caller may have been exposed?     On neck - unsure 3. ONSET - TICK NOT STILL ATTACHED: If the tick has been removed, how long do you think the tick was attached before you removed it? (e.g., 5 hours, 2 days). When was this?     unsure 4. LOCATION: Where is the tick bite located? (e.g., arm, leg)     Neck 5. TYPE of TICK: Is it a wood tick or a deer tick? (e.g., deer tick, wood tick; unsure)     unsure 6. SIZE of TICK: How big is the tick? (e.g., size of poppy seed, apple seed, watermelon seed; unsure) Note: Deer ticks can be the size of a poppy seed (nymph) or an apple seed (adult).       small 7. ENGORGED: Did the tick look flat or engorged (full, swollen)? (e.g., flat, engorged; unsure)     no 8. OTHER SYMPTOMS: Do you have any other  symptoms? (e.g., fever, rash, redness at bite area, red ring around bite)     Dizzy, diarrhea 9. PREGNANCY: Is there any chance you are pregnant? When was your last menstrual period?     no  Protocols used: Tick Bite-A-AH  Reason for Disposition  [1] Probable deer tick AND [2] attached 36 hours or more (or tick appears swollen, not flat) AND [3] occurred in an area where Lyme disease is common  Answer Assessment - Initial Assessment Questions 1. ATTACHED:  Is the tick still on the skin?  (e.g., yes, no, unsure)     no 2. ONSET - TICK STILL ATTACHED:  How long do you think the tick has been on your skin? (e.g., hours, days, unsure)  Note:  Is there a recent activity (camping, hiking) where the caller may have been exposed?     On neck - unsure 3. ONSET - TICK NOT STILL ATTACHED: If the tick has been removed, how long do you think the tick was attached before you removed it? (e.g., 5 hours, 2 days). When was this?     unsure 4. LOCATION: Where is the tick bite located? (e.g., arm, leg)     Neck 5. TYPE of TICK: Is it a wood tick or a deer tick? (e.g., deer tick, wood tick; unsure)  unsure 6. SIZE of TICK: How big is the tick? (e.g., size of poppy seed, apple seed, watermelon seed; unsure) Note: Deer ticks can be the size of a poppy seed (nymph) or an apple seed (adult).       small 7. ENGORGED: Did the tick look flat or engorged (full, swollen)? (e.g., flat, engorged; unsure)     no 8. OTHER SYMPTOMS: Do you have any other symptoms? (e.g., fever, rash, redness at bite area, red ring around bite)     Dizzy, diarrhea 9. PREGNANCY: Is there any chance you are pregnant? When was your last menstrual period?     no  Protocols used: Tick Bite-A-AH

## 2023-11-25 NOTE — Telephone Encounter (Signed)
 Patient scheduled.

## 2023-11-26 ENCOUNTER — Other Ambulatory Visit: Payer: Self-pay | Admitting: Nurse Practitioner

## 2023-11-26 LAB — BASIC METABOLIC PANEL WITH GFR
BUN/Creatinine Ratio: 19 (ref 12–28)
BUN: 19 mg/dL (ref 8–27)
CO2: 20 mmol/L (ref 20–29)
Calcium: 10 mg/dL (ref 8.7–10.3)
Chloride: 103 mmol/L (ref 96–106)
Creatinine, Ser: 1 mg/dL (ref 0.57–1.00)
Glucose: 171 mg/dL — ABNORMAL HIGH (ref 70–99)
Potassium: 4.4 mmol/L (ref 3.5–5.2)
Sodium: 140 mmol/L (ref 134–144)
eGFR: 64 mL/min/{1.73_m2} (ref >59)

## 2023-11-27 ENCOUNTER — Encounter: Payer: Self-pay | Admitting: Nurse Practitioner

## 2023-11-27 ENCOUNTER — Ambulatory Visit: Admitting: Nurse Practitioner

## 2023-11-27 VITALS — BP 130/80 | HR 81 | Ht 65.0 in | Wt 225.0 lb

## 2023-11-27 DIAGNOSIS — E039 Hypothyroidism, unspecified: Secondary | ICD-10-CM

## 2023-11-27 DIAGNOSIS — Z794 Long term (current) use of insulin: Secondary | ICD-10-CM | POA: Diagnosis not present

## 2023-11-27 DIAGNOSIS — Z7985 Long-term (current) use of injectable non-insulin antidiabetic drugs: Secondary | ICD-10-CM

## 2023-11-27 DIAGNOSIS — E782 Mixed hyperlipidemia: Secondary | ICD-10-CM | POA: Diagnosis not present

## 2023-11-27 DIAGNOSIS — E559 Vitamin D deficiency, unspecified: Secondary | ICD-10-CM

## 2023-11-27 DIAGNOSIS — E1165 Type 2 diabetes mellitus with hyperglycemia: Secondary | ICD-10-CM | POA: Diagnosis not present

## 2023-11-27 DIAGNOSIS — Z7984 Long term (current) use of oral hypoglycemic drugs: Secondary | ICD-10-CM | POA: Diagnosis not present

## 2023-11-27 DIAGNOSIS — I10 Essential (primary) hypertension: Secondary | ICD-10-CM

## 2023-11-27 LAB — POCT GLYCOSYLATED HEMOGLOBIN (HGB A1C): Hemoglobin A1C: 8.8 % — AB (ref 4.0–5.6)

## 2023-11-27 MED ORDER — LANTUS SOLOSTAR 100 UNIT/ML ~~LOC~~ SOPN: 70.0000 [IU] | PEN_INJECTOR | Freq: Every day | SUBCUTANEOUS | 3 refills | Status: DC

## 2023-11-27 MED ORDER — NOVOLOG FLEXPEN 100 UNIT/ML ~~LOC~~ SOPN: 20.0000 [IU] | PEN_INJECTOR | Freq: Three times a day (TID) | SUBCUTANEOUS | 3 refills | Status: DC

## 2023-11-27 MED ORDER — LEVOTHYROXINE SODIUM 75 MCG PO TABS: ORAL_TABLET | ORAL | 3 refills | Status: DC

## 2023-11-27 MED ORDER — OZEMPIC (1 MG/DOSE) 4 MG/3ML ~~LOC~~ SOPN: 1.0000 mg | PEN_INJECTOR | SUBCUTANEOUS | 3 refills | Status: DC

## 2023-11-27 MED ORDER — METFORMIN HCL ER 500 MG PO TB24: 500.0000 mg | ORAL_TABLET | Freq: Two times a day (BID) | ORAL | 3 refills | Status: DC

## 2023-11-27 NOTE — Progress Notes (Signed)
 11/27/2023   Endocrinology follow-up note   Subjective:    Patient ID: Sandy Valencia, female    DOB: [birth year], PCP Tobie Suzzane POUR, MD   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   Anaphylactic shock, unspecified, subsequent encounter    Anemia, iron deficiency 02/14/2015   patient is not aware this dx   Angio-edema    Arthritis    Asthma    Mild intermittent asthma   Asymptomatic varicose veins of right lower extremity    Chronic obstructive pulmonary disease with (acute) exacerbation (HCC)    patient denies this dx   Chronic rhinitis 05/21/2018   COVID-19 2019   Diabetes mellitus    type 2   Essential (primary) hypertension    Family history of colon cancer 09/03/2012   Fatty liver    Gastro-esophageal reflux disease without esophagitis    GERD (gastroesophageal reflux disease)    Hearing loss    wears hearing aid   History of colonic polyps 08/26/2017   Hypercholesteremia    Hypertension    Hypothyroidism    Low back pain    and left leg pain   Mass of finger, right s/p surgical excision (mucoid cyst) 04/17/18 04/22/2018   Mucous cyst of digit of right hand    Obesity, unspecified    Other adverse food reactions, not elsewhere classified, initial encounter    Personal history of noncompliance with medical treatment, presenting hazards to health 05/18/2015   Rash and other nonspecific skin eruption    Restless legs syndrome    patient denies this dx   Sleep apnea    pt siad, i had small amount but could not tolerate CPAP and they said it was ok since it was mild.   Unspecified asthma, uncomplicated    Viral URI 07/13/2022   Zoster without complications    Past Surgical History:  Procedure Laterality Date   ABDOMINAL HYSTERECTOMY     BACK SURGERY  11/26/2022   BIOPSY  11/08/2022   Procedure: BIOPSY;  Surgeon: Shaaron Lamar HERO, MD;  [facility]: [facility];   Service: Endoscopy;;   CATARACT EXTRACTION W/PHACO Left 03/09/2022   Procedure: CATARACT EXTRACTION PHACO AND INTRAOCULAR LENS PLACEMENT (IOC);  Surgeon: Harrie Agent, MD;  Location: [facility];  Service: Ophthalmology;  Laterality: Left;  CDE 7.89   CATARACT EXTRACTION W/PHACO Right 03/23/2022   Procedure: CATARACT EXTRACTION PHACO AND INTRAOCULAR LENS PLACEMENT (IOC);  Surgeon: Harrie Agent, MD;  Location: [facility];  Service: Ophthalmology;  Laterality: Right;  CDE: 2.63   CHOLECYSTECTOMY     COLONOSCOPY N/A 09/22/2012   RMR: colonic polyps -removed as described above. tubular adenoma, next TCS 09/2017   COLONOSCOPY WITH PROPOFOL  N/A 09/23/2017   Surgeon: Shaaron Lamar HERO, MD; internal hemorrhoids, three 5-6 mm polyps removed, diverticulosis in sigmoid and descending colon.  Pathology with tubular adenomas and rectal hyperplastic polyp.  Recommended 5 year surveillance.   COLONOSCOPY WITH PROPOFOL  N/A 11/08/2022   Procedure: COLONOSCOPY WITH PROPOFOL ;  Surgeon: Shaaron Lamar HERO, MD;  [facility];  Service: Endoscopy;  Laterality: N/A;  815am, asa 3   CYST EXCISION Left 07/19/2016   Procedure: CYST REMOVAL LEFT RING FINGER;  Surgeon: Taft FORBES Minerva, MD;  Location: [facility];  Service: Orthopedics;  Laterality: Left;   CYST REMOVAL LEG Right    foot   ESOPHAGOGASTRODUODENOSCOPY N/A 10/21/2014   RMR: Small hiatal hernia;  otherwise normal EGD status post passage of a Maloney dilator.    ESOPHAGOGASTRODUODENOSCOPY (EGD) WITH PROPOFOL  N/A 11/08/2022   Procedure: ESOPHAGOGASTRODUODENOSCOPY (EGD) WITH PROPOFOL ;  Surgeon: Shaaron Lamar HERO, MD;  [facility];  Service: Endoscopy;  Laterality: N/A;   EXCISION MASS UPPER EXTREMETIES Right 04/17/2018   Procedure: EXCISION MASS UPPER EXTREMETIES right ring finger;  Surgeon: Margrette Taft BRAVO, MD;  Location: [facility];  Service: Orthopedics;  Laterality: Right;   LUMBAR LAMINECTOMY/DECOMPRESSION MICRODISCECTOMY Bilateral 11/26/2022    Procedure: Microdiscectomy - bilateral - Lumbar four-Lumbar five;  Surgeon: Louis Shove, MD;  [facility];  Service: Neurosurgery;  Laterality: Bilateral;   MALONEY DILATION N/A 10/21/2014   Procedure: AGAPITO DILATION;  Surgeon: Lamar HERO Shaaron, MD;  [facility];  Service: Endoscopy;  Laterality: N/A;   MALONEY DILATION N/A 11/08/2022   Procedure: AGAPITO DILATION;  Surgeon: Shaaron Lamar HERO, MD;  [facility];  Service: Endoscopy;  Laterality: N/A;   MIDDLE EAR SURGERY Right    patched hole in ear drum   POLYPECTOMY  09/23/2017   Procedure: POLYPECTOMY;  Surgeon: Shaaron Lamar HERO, MD;  [facility];  Service: Endoscopy;;  polyp hepatic flexure polyp cs, splenic flexure polyp cs, rectal polyp cs   POLYPECTOMY  11/08/2022   Procedure: POLYPECTOMY;  Surgeon: Shaaron Lamar HERO, MD;  [facility];  Service: Endoscopy;;   TOTAL ABDOMINAL HYSTERECTOMY     Social History   Socioeconomic History   Marital status: Divorced    Spouse name: Not on file   Number of children: 2   Years of education: Not on file   Highest education level: Not on file  Occupational History   Not on file  Tobacco Use   Smoking status: Never   Smokeless tobacco: Never  Vaping Use   Vaping status: Never Used  Substance and Sexual Activity   Alcohol use: No    Alcohol/week: 0.0 standard drinks of alcohol   Drug use: No   Sexual activity: Not Currently    Birth control/protection: Surgical    Comment: Hysterectomy  Other Topics Concern   Not on file  Social History Narrative   Lives with grandson (since born)      Enjoys: vacation, travel, concerts      Diet: eats all food groups    Caffeine: diet Pepsi daily    Water :  8 cups daily       Wears seat belt    Does use phone   Smoke Retail buyer in safe area    Social Drivers of Health   Financial Resource Strain: Low Risk  (12/02/2019)   Overall Financial Resource Strain (CARDIA)    Difficulty of  Paying Living Expenses: Not hard at all  Food Insecurity: No Food Insecurity (12/02/2019)   Hunger Vital Sign    Worried About Running Out of Food in the Last Year: Never true    Ran Out of Food in the Last Year: Never true  Transportation Needs: No Transportation Needs (12/02/2019)   PRAPARE - Administrator, Civil Service (Medical): No    Lack of Transportation (Non-Medical): No  Physical Activity: Insufficiently Active (12/02/2019)   Exercise Vital Sign    Days of Exercise per Week: 2 days    Minutes of Exercise per Session: 20 min  Stress: Not on file  Social Connections: Moderately Isolated (12/02/2019)   Social Connection and Isolation Panel    Frequency of Communication with  Friends and Family: More than three times a week    Frequency of Social Gatherings with Friends and Family: More than three times a week    Attends Religious Services: More than 4 times per year    Active Member of [employer] or Organizations: No    Attends Banker Meetings: Never    Marital Status: Divorced   Outpatient Encounter Medications as of 11/27/2023  Medication Sig   Accu-Chek FastClix Lancets MISC TEST FOUR TIMES DAILY   albuterol  (VENTOLIN  HFA) 108 (90 Base) MCG/ACT inhaler INHALE 4 PUFFS INTO THE LUNGS EVERY 6 HOURS AS NEEDED FOR WHEEZING OR SHORTNESS OF BREATH   aspirin EC 81 MG tablet Take 81 mg by mouth daily.   cetirizine  (ZYRTEC ) 10 MG tablet Take 1 tablet (10 mg total) by mouth daily.   Cholecalciferol  (VITAMIN D -3) 1000 UNITS CAPS Take 1,000 Units by mouth daily.    cyclobenzaprine  (FLEXERIL ) 10 MG tablet Take 10 mg by mouth at bedtime.   diclofenac  Sodium (VOLTAREN ) 1 % GEL APPLY 2 GRAMS TO AFFECTED AREAS 2 TIMES A DAY AS NEEDED   doxycycline (VIBRA-TABS) 100 MG tablet Take 1 tablet (100 mg total) by mouth 2 (two) times daily for 7 days.   EPINEPHrine  0.3 mg/0.3 mL IJ SOAJ injection Inject 0.3 mg into the muscle as needed for anaphylaxis.   fluticasone  (FLONASE ) 50  MCG/ACT nasal spray Place 2 sprays into both nostrils daily as needed.   glucose blood (ACCU-CHEK GUIDE) test strip test FOUR TIMES DAILY   HYDROcodone -acetaminophen  (NORCO) 7.5-325 MG tablet Take 1 tablet by mouth 4 (four) times daily as needed.   ketoconazole  (NIZORAL ) 2 % cream APPLY TO AFFECTED AREAS TWICE DAILY   LITETOUCH PEN NEEDLES 31G X 8 MM MISC USE FOUR TIMES DAILY   olmesartan  (BENICAR ) 40 MG tablet TAKE ONE TABLET (40MG  TOTAL) BY MOUTH DAILY   pantoprazole  (PROTONIX ) 40 MG tablet TAKE ONE TABLET (40MG  TOTAL) BY MOUTH DAILY   pregabalin  (LYRICA ) 100 MG capsule Take 1 capsule (100 mg total) by mouth 2 (two) times daily.   simvastatin  (ZOCOR ) 10 MG tablet Take 1 tablet (10 mg total) by mouth daily at 6 PM.   vitamin B-12 (CYANOCOBALAMIN ) 500 MCG tablet Take 500 mcg by mouth daily.   [DISCONTINUED] insulin  aspart (NOVOLOG  FLEXPEN) 100 UNIT/ML FlexPen Inject 20-26 Units into the skin 3 (three) times daily with meals.   [DISCONTINUED] LANTUS  SOLOSTAR 100 UNIT/ML Solostar Pen Inject 70 Units into the skin at bedtime.   [DISCONTINUED] levothyroxine  (SYNTHROID ) 75 MCG tablet TAKE ONE (1) TABLET BY MOUTH EVERY DAY BEFORE BREAKFAST (Patient taking differently: Take 75 mcg by mouth daily before breakfast. TAKE ONE (1) TABLET BY MOUTH EVERY DAY BEFORE BREAKFAST Patient states that she skips the medication 2 days a week.)   [DISCONTINUED] metFORMIN  (GLUCOPHAGE -XR) 500 MG 24 hr tablet Take 1 tablet (500 mg total) by mouth 2 (two) times daily with a meal.   [DISCONTINUED] Semaglutide ,0.25 or 0.5MG /DOS, (OZEMPIC , 0.25 OR 0.5 MG/DOSE,) 2 MG/3ML SOPN Inject 0.25 mg into the skin once a week.   insulin  aspart (NOVOLOG  FLEXPEN) 100 UNIT/ML FlexPen Inject 20-26 Units into the skin 3 (three) times daily with meals.   LANTUS  SOLOSTAR 100 UNIT/ML Solostar Pen Inject 70 Units into the skin at bedtime.   levothyroxine  (SYNTHROID ) 75 MCG tablet TAKE ONE (1) TABLET BY MOUTH EVERY DAY BEFORE BREAKFAST   metFORMIN   (GLUCOPHAGE -XR) 500 MG 24 hr tablet Take 1 tablet (500 mg total) by mouth 2 (two) times daily  with a meal.   Semaglutide , 1 MG/DOSE, (OZEMPIC , 1 MG/DOSE,) 4 MG/3ML SOPN Inject 1 mg into the skin once a week.   [DISCONTINUED] Semaglutide , 1 MG/DOSE, (OZEMPIC , 1 MG/DOSE,) 4 MG/3ML SOPN Inject 1 mg into the skin once a week. (Patient not taking: Reported on 11/27/2023)   No facility-administered encounter medications on file as of 11/27/2023.   ALLERGIES: Allergies  Allergen Reactions   Kiwi Extract Anaphylaxis, Swelling and Palpitations   Other Anaphylaxis    Walnuts (avoids all tree nuts)   Statins     Transaminase elevation   VACCINATION STATUS: Immunization History  Administered Date(s) Administered   Influenza,inj,Quad PF,6+ Mos 04/15/2017, 03/30/2019   Influenza-Unspecified 04/17/2011, 03/24/2012, 05/12/2013, 02/23/2015, 04/04/2017, 04/08/2018   Moderna Sars-Covid-2 Vaccination 10/22/2019, 11/19/2019   Tdap 09/18/2021   Zoster Recombinant(Shingrix ) 09/14/2021, 01/16/2022    Diabetes She presents for her follow-up diabetic visit. She has type 2 diabetes mellitus. Onset time: She was diagnosed at approximate age of 77 years. Her disease course has been improving. There are no hypoglycemic associated symptoms. Pertinent negatives for hypoglycemia include no confusion, nervousness/anxiousness, pallor or seizures. Pertinent negatives for diabetes include no fatigue, no polydipsia, no polyphagia, no polyuria and no weight loss. There are no hypoglycemic complications. Symptoms are stable. There are no diabetic complications. Risk factors for coronary artery disease include dyslipidemia, diabetes mellitus, hypertension, sedentary lifestyle, obesity, post-menopausal and stress. Current diabetic treatment includes intensive insulin  program and oral agent (monotherapy) (and Ozempic ). She is compliant with treatment most of the time. Her weight is stable. She is following a generally healthy diet.  When asked about meal planning, she reported none. She has had a previous visit with a dietitian. She never participates in exercise. Her home blood glucose trend is decreasing steadily. Her overall blood glucose range is 180-200 mg/dl. (She presents today with her meter showing improving, slightly above target glycemic profile.  Her POCT A1c today is 8.8%, improving from last visit of 9.5%.  Insurance denied her from getting Dexcom due to worsening A1c.  Analysis of her meter shows 7-day average of 194, 14-day average of 188, 30-day average of 200, 90-day average of 193.  She has worked her way back to the 0.5 mg of Ozempic , tolerating it well.) An ACE inhibitor/angiotensin II receptor blocker is being taken. She sees a podiatrist.Eye exam is current.  Hyperlipidemia This is a chronic problem. The current episode started more than 1 year ago. The problem is controlled. Recent lipid tests were reviewed and are normal. Exacerbating diseases include diabetes, hypothyroidism and obesity. There are no known factors aggravating her hyperlipidemia. Pertinent negatives include no myalgias. Current antihyperlipidemic treatment includes statins. The current treatment provides significant improvement of lipids. Compliance problems include medication cost, adherence to diet and adherence to exercise.  Risk factors for coronary artery disease include diabetes mellitus, dyslipidemia, hypertension, obesity, a sedentary lifestyle and post-menopausal.  Hypertension This is a chronic problem. The current episode started more than 1 year ago. The problem has been gradually improving since onset. The problem is controlled. Pertinent negatives include no palpitations. Agents associated with hypertension include thyroid  hormones. Risk factors for coronary artery disease include diabetes mellitus, dyslipidemia, obesity, post-menopausal state, sedentary lifestyle and stress. Past treatments include ACE inhibitors. The current  treatment provides mild improvement. Compliance problems include diet and exercise.  Identifiable causes of hypertension include sleep apnea and a thyroid  problem.  Thyroid  Problem Presents for follow-up visit. Patient reports no anxiety, cold intolerance, constipation, depressed mood, diarrhea, dry skin, fatigue, hair  loss, heat intolerance, palpitations, weight gain or weight loss. The symptoms have been stable. Her past medical history is significant for diabetes.     Review of systems  Constitutional: + stable body weight,  current Body mass index is 37.44 kg/m. , no fatigue, no subjective hyperthermia, no subjective hypothermia Eyes: no blurry vision, no xerophthalmia ENT: no sore throat, no nodules palpated in throat, no dysphagia/odynophagia, no hoarseness Cardiovascular: no chest pain, no shortness of breath, no palpitations, no leg swelling Respiratory: no cough, no shortness of breath Gastrointestinal: no nausea/vomiting/diarrhea Musculoskeletal: chronic back pain Skin: no rashes, no hyperemia Neurological: no tremors, no numbness, no tingling, no dizziness Psychiatric: no depression, no anxiety   Objective:    BP 130/80 (BP Location: Left Arm, Patient Position: Sitting, Cuff Size: Large)   Pulse 81   Ht 5' 5 (1.651 m)   Wt 225 lb (102.1 kg)   BMI 37.44 kg/m   Wt Readings from Last 3 Encounters:  11/27/23 225 lb (102.1 kg)  11/25/23 224 lb 9.6 oz (101.9 kg)  11/12/23 227 lb 12.8 oz (103.3 kg)    BP Readings from Last 3 Encounters:  11/27/23 130/80  11/25/23 123/61  11/12/23 131/82     Physical Exam- Limited  Constitutional:  Body mass index is 37.44 kg/m. , not in acute distress, normal state of mind Eyes:  EOMI, no exophthalmos Musculoskeletal: no gross deformities, strength intact in all four extremities, no gross restriction of joint movements Skin:  no rashes, no hyperemia Neurological: no tremor with outstretched hands   Diabetic Foot Exam - Simple    No data filed      Lipid Panel     Component Value Date/Time   CHOL 153 10/17/2023 1527   TRIG 90 10/17/2023 1527   HDL 66 10/17/2023 1527   CHOLHDL 2.3 10/17/2023 1527   CHOLHDL 2.4 12/25/2019 0709   VLDL 10 03/07/2016 0705   LDLCALC 70 10/17/2023 1527   LDLCALC 65 12/25/2019 0709    Recent Results (from the past 2160 hours)  Urine Microalbumin w/creat. ratio     Status: None   Collection Time: 10/17/23  3:20 PM  Result Value Ref Range   Creatinine, Urine 85.5 Not Estab. mg/dL   Microalbumin, Urine 6.2 Not Estab. ug/mL   Microalb/Creat Ratio 7 0 - 29 mg/g creat    Comment:                        Normal:                0 -  29                        Moderately increased: 30 - 300                        Severely increased:       >300   CMP14+EGFR     Status: Abnormal   Collection Time: 10/17/23  3:27 PM  Result Value Ref Range   Glucose 135 (H) 70 - 99 mg/dL   BUN 16 8 - 27 mg/dL   Creatinine, Ser 9.22 0.57 - 1.00 mg/dL   eGFR 87 >40 fO/fpw/8.26   BUN/Creatinine Ratio 21 12 - 28   Sodium 141 134 - 144 mmol/L   Potassium 4.5 3.5 - 5.2 mmol/L   Chloride 103 96 - 106 mmol/L   CO2 21 20 - 29 mmol/L  Calcium 9.8 8.7 - 10.3 mg/dL   Total Protein 7.0 6.0 - 8.5 g/dL   Albumin 4.6 3.9 - 4.9 g/dL   Globulin, Total 2.4 1.5 - 4.5 g/dL   Bilirubin Total 0.3 0.0 - 1.2 mg/dL   Alkaline Phosphatase 108 44 - 121 IU/L   AST 17 0 - 40 IU/L   ALT 24 0 - 32 IU/L  Lipid Profile     Status: None   Collection Time: 10/17/23  3:27 PM  Result Value Ref Range   Cholesterol, Total 153 100 - 199 mg/dL   Triglycerides 90 0 - 149 mg/dL   HDL 66 >60 mg/dL   VLDL Cholesterol Cal 17 5 - 40 mg/dL   LDL Chol Calc (NIH) 70 0 - 99 mg/dL   Chol/HDL Ratio 2.3 0.0 - 4.4 ratio    Comment:                                   T. Chol/HDL Ratio                                             Men  Women                               1/2 Avg.Risk  3.4    3.3                                   Avg.Risk  5.0     4.4                                2X Avg.Risk  9.6    7.1                                3X Avg.Risk 23.4   11.0   Basic Metabolic Panel (BMET)     Status: Abnormal   Collection Time: 11/25/23  2:34 PM  Result Value Ref Range   Glucose 171 (H) 70 - 99 mg/dL   BUN 19 8 - 27 mg/dL   Creatinine, Ser 8.99 0.57 - 1.00 mg/dL   eGFR 64 >40 fO/fpw/8.26   BUN/Creatinine Ratio 19 12 - 28   Sodium 140 134 - 144 mmol/L   Potassium 4.4 3.5 - 5.2 mmol/L   Chloride 103 96 - 106 mmol/L   CO2 20 20 - 29 mmol/L   Calcium 10.0 8.7 - 10.3 mg/dL  HgB J8r     Status: Abnormal   Collection Time: 11/27/23  9:16 AM  Result Value Ref Range   Hemoglobin A1C 8.8 (A) 4.0 - 5.6 %   HbA1c POC (<> result, manual entry)     HbA1c, POC (prediabetic range)     HbA1c, POC (controlled diabetic range)         Assessment & Plan:   1) Uncontrolled type 2 diabetes mellitus with complication, with long-term current use of insulin  (HCC)  She presents today with her meter showing improving, slightly above target glycemic profile.  Her POCT A1c today is 8.8%, improving from last visit  of 9.5%.  Insurance denied her from getting Dexcom due to worsening A1c.  Analysis of her meter shows 7-day average of 194, 14-day average of 188, 30-day average of 200, 90-day average of 193.  She has worked her way back to the 0.5 mg of Ozempic , tolerating it well.  -Her diabetes is complicated by history of noncompliance (she recently showed better engagement) and patient remains at extremely high risk for more acute and chronic complications of diabetes which include CAD, CVA, CKD, retinopathy, and neuropathy. These are all discussed in detail with the patient.  - Nutritional counseling repeated at each appointment due to patients tendency to fall back in to old habits.  - The patient admits there is a room for improvement in their diet and drink choices. -  Suggestion is made for the patient to avoid simple carbohydrates from their  diet including Cakes, Sweet Desserts / Pastries, Ice Cream, Soda (diet and regular), Sweet Tea, Candies, Chips, Cookies, Sweet Pastries, Store Bought Juices, Alcohol in Excess of 1-2 drinks a day, Artificial Sweeteners, Coffee Creamer, and Sugar-free Products. This will help patient to have stable blood glucose profile and potentially avoid unintended weight gain.   - I encouraged the patient to switch to unprocessed or minimally processed complex starch and increased protein intake (animal or plant source), fruits, and vegetables.   - Patient is advised to stick to a routine mealtimes to eat 3 meals a day and avoid unnecessary snacks (to snack only to correct hypoglycemia).  - I have approached patient with the following individualized plan to manage diabetes and patient agrees.  -She will continue to require intensive treatment with  basal/bolus insulin  in order for her to achieve and maintain control of diabetes to target.  - She is advised to continue her Lantus  70 units SQ nightly, Novolog  20-26 units TID with meals if glucose is above 90 and she is eating (Specific instructions on how to titrate insulin  dosage based on glucose readings given to patient in writing), Metformin  500 mg ER twice daily with meals and increase her Ozempic  back to 1 mg SQ weekly.   -She is encouraged to continue monitoring blood glucose 4 times daily-using her CGM, before meals and before bed, and to call the clinic if she has readings less than 70 or greater than 300 for 3 tests in a row.  She has benefited from CGM device and is advised to continue.  2) Lipids/HPL:  Her most recent lipid panel from 07/02/23 shows controlled LDL of 59.  She was put back on Simvastatin  by her PCP.  Side effects and precautions discussed with her.    3) Hypertension:  Her blood pressure is controlled to target.  She is advised to continue medications as prescribed by PCP.  4) Hypothyroidism:  -There are no recent TFTs to review.   She is advised to continue her current dose of Levothyroxine  75 mcg po daily before breakfast (skipping 2 days out of the week).  Will recheck prior to next visit and adjust dose accordingly.   - We discussed about the correct intake of her thyroid  hormone, on empty stomach at fasting, with water , separated by at least 30 minutes from breakfast and other medications,  and separated by more than 4 hours from calcium, iron, multivitamins, acid reflux medications (PPIs). -Patient is made aware of the fact that thyroid  hormone replacement is needed for life, dose to be adjusted by periodic monitoring of thyroid  function tests.    - I advised  patient to maintain close follow up with Tobie Suzzane POUR, MD for primary care needs.     I spent  42  minutes in the care of the patient today including review of labs from CMP, Lipids, Thyroid  Function, Hematology (current and previous including abstractions from other facilities); face-to-face time discussing  her blood glucose readings/logs, discussing hypoglycemia and hyperglycemia episodes and symptoms, medications doses, her options of short and long term treatment based on the latest standards of care / guidelines;  discussion about incorporating lifestyle medicine;  and documenting the encounter. Risk reduction counseling performed per USPSTF guidelines to reduce obesity and cardiovascular risk factors.     Please refer to Patient Instructions for Blood Glucose Monitoring and Insulin /Medications Dosing Guide  in media tab for additional information. Please  also refer to  Patient Self Inventory in the Media  tab for reviewed elements of pertinent patient history.  Madelin LELON Colburn participated in the discussions, expressed understanding, and voiced agreement with the above plans.  All questions were answered to her satisfaction. she is encouraged to contact clinic should she have any questions or concerns prior to her return visit.    Follow up  plan: -Return in about 4 months (around 03/28/2024) for Diabetes F/U with A1c in office, Thyroid  follow up, Previsit labs, Bring meter and logs.  Benton Rio, [facility] [address] [place], [place] [address] Phone: [phone] Fax: [phone]   11/27/2023, 9:29 AM

## 2023-12-03 DIAGNOSIS — M545 Low back pain, unspecified: Secondary | ICD-10-CM | POA: Diagnosis not present

## 2023-12-03 DIAGNOSIS — M25549 Pain in joints of unspecified hand: Secondary | ICD-10-CM | POA: Diagnosis not present

## 2023-12-03 DIAGNOSIS — M5116 Intervertebral disc disorders with radiculopathy, lumbar region: Secondary | ICD-10-CM | POA: Diagnosis not present

## 2023-12-03 DIAGNOSIS — G894 Chronic pain syndrome: Secondary | ICD-10-CM | POA: Diagnosis not present

## 2023-12-03 DIAGNOSIS — Z79891 Long term (current) use of opiate analgesic: Secondary | ICD-10-CM | POA: Diagnosis not present

## 2023-12-04 ENCOUNTER — Telehealth: Payer: Self-pay

## 2023-12-04 NOTE — Telephone Encounter (Signed)
 Labs have not be read yet

## 2023-12-04 NOTE — Telephone Encounter (Signed)
 Copied from CRM [id number]. Topic: Clinical - Lab/Test Results >> Dec 04, 2023  8:52 AM Vena H wrote: Reason for CRM: pt is wanting labwork results from 6/25

## 2023-12-08 ENCOUNTER — Ambulatory Visit: Payer: Self-pay

## 2023-12-12 ENCOUNTER — Telehealth: Payer: Self-pay

## 2023-12-12 NOTE — Telephone Encounter (Signed)
 Patient advised of labs.

## 2023-12-12 NOTE — Telephone Encounter (Signed)
 Copied from CRM [id number]. Topic: Clinical - Lab/Test Results >> Dec 12, 2023  8:48 AM Treva T wrote: Reason for CRM: Patient calling, inquiring on lab results, as she states she has not received a call to discuss results.   Per chart review, a message was sent, however, per patient did not receive message.  Patient is requesting a return call to discuss results.   Patient can be reached at [phone] to discuss further.

## 2023-12-16 ENCOUNTER — Other Ambulatory Visit: Payer: Self-pay | Admitting: Internal Medicine

## 2023-12-16 DIAGNOSIS — E782 Mixed hyperlipidemia: Secondary | ICD-10-CM

## 2024-01-13 ENCOUNTER — Other Ambulatory Visit: Payer: Self-pay | Admitting: Internal Medicine

## 2024-01-15 ENCOUNTER — Ambulatory Visit (HOSPITAL_COMMUNITY)
Admission: RE | Admit: 2024-01-15 | Discharge: 2024-01-15 | Disposition: A | Discharge status: Home / Self Care | Source: Ambulatory Visit | Attending: Internal Medicine | Admitting: Internal Medicine

## 2024-01-15 DIAGNOSIS — Z1231 Encounter for screening mammogram for malignant neoplasm of breast: Secondary | ICD-10-CM | POA: Insufficient documentation

## 2024-01-15 DIAGNOSIS — Z780 Asymptomatic menopausal state: Secondary | ICD-10-CM | POA: Diagnosis not present

## 2024-01-16 ENCOUNTER — Ambulatory Visit: Payer: Self-pay | Admitting: Internal Medicine

## 2024-02-04 DIAGNOSIS — M5116 Intervertebral disc disorders with radiculopathy, lumbar region: Secondary | ICD-10-CM | POA: Diagnosis not present

## 2024-02-04 DIAGNOSIS — G894 Chronic pain syndrome: Secondary | ICD-10-CM | POA: Diagnosis not present

## 2024-02-04 DIAGNOSIS — Z79891 Long term (current) use of opiate analgesic: Secondary | ICD-10-CM | POA: Diagnosis not present

## 2024-02-04 DIAGNOSIS — Z79899 Other long term (current) drug therapy: Secondary | ICD-10-CM | POA: Diagnosis not present

## 2024-02-04 DIAGNOSIS — M545 Low back pain, unspecified: Secondary | ICD-10-CM | POA: Diagnosis not present

## 2024-02-17 ENCOUNTER — Ambulatory Visit: Admitting: Internal Medicine

## 2024-02-17 ENCOUNTER — Other Ambulatory Visit: Payer: Self-pay | Admitting: Internal Medicine

## 2024-02-17 ENCOUNTER — Encounter: Payer: Self-pay | Admitting: Internal Medicine

## 2024-02-17 VITALS — BP 138/84 | HR 94 | Ht 65.0 in | Wt 224.6 lb

## 2024-02-17 DIAGNOSIS — E039 Hypothyroidism, unspecified: Secondary | ICD-10-CM

## 2024-02-17 DIAGNOSIS — J0111 Acute recurrent frontal sinusitis: Secondary | ICD-10-CM | POA: Diagnosis not present

## 2024-02-17 DIAGNOSIS — E782 Mixed hyperlipidemia: Secondary | ICD-10-CM | POA: Diagnosis not present

## 2024-02-17 DIAGNOSIS — Z794 Long term (current) use of insulin: Secondary | ICD-10-CM

## 2024-02-17 DIAGNOSIS — E1165 Type 2 diabetes mellitus with hyperglycemia: Secondary | ICD-10-CM | POA: Diagnosis not present

## 2024-02-17 DIAGNOSIS — I10 Essential (primary) hypertension: Secondary | ICD-10-CM

## 2024-02-17 DIAGNOSIS — E559 Vitamin D deficiency, unspecified: Secondary | ICD-10-CM

## 2024-02-17 MED ORDER — AMOXICILLIN-POT CLAVULANATE 875-125 MG PO TABS: 1.0000 | ORAL_TABLET | Freq: Two times a day (BID) | ORAL | 0 refills | Status: DC

## 2024-02-17 NOTE — Assessment & Plan Note (Signed)
 Lab Results  Component Value Date   TSH 2.300 07/02/2023   On levothyroxine  75 mcg QD, followed by Endocrinology

## 2024-02-17 NOTE — Assessment & Plan Note (Signed)
 Lab Results  Component Value Date   HGBA1C 8.8 (A) 11/27/2023   Uncontrolled, had stopped Ozempic  in berween, has resumed since last HbA1c check Follows up with Endocrinology On Lantus  70 units and NovoLog  ISS Continue metformin  and Ozempic , needs to stay compliant to Ozempic  On statin Takes Lyrica  for neuropathy/low back pain - 100 mg BID due to worsening of radicular pain

## 2024-02-17 NOTE — Assessment & Plan Note (Signed)
 BP Readings from Last 1 Encounters:  02/17/24 138/84   Usually well-controlled with olmesartan  40 mg QD Discontinued lisinopril  due to her chronic cough in the last visit Counseled for compliance with the medications Advised DASH diet and moderate exercise/walking, at least 150 mins/week

## 2024-02-17 NOTE — Progress Notes (Unsigned)
 Established Patient Office Visit  Subjective:  Patient ID: Sandy Valencia, female    DOB: 11/28/1960  Age: 63 y.o. MRN: [id number]  CC:  Chief Complaint  Patient presents with   Hypertension    4 month f/u    Diabetes    4 month f/u    Sinus Problem    Reports sx of sinus problem, hacking cough, ear fullness, congested and facial pressure.     HPI Sandy Valencia is a 63 y.o. female with past medical history of HTN, DM, hypothyroidism, NAFLD, GERD, chronic pain syndrome and morbid obesity who presents for follow up of her chronic medical conditions.  HTN: Her BP was wnl today.  She is on olmesartan  40 mg QD. She denies any headache, dizziness, chest pain, dyspnea or palpitations.  Type II DM: Followed by endocrinology. Last HbA1c was 9.5 in 02/25. She has been taking Ozempic , but inconsistently.  She has stopped taking Ozempic  for the last 2 weeks due to severe leg pain.  She had stopped it in the past during physical therapy as well.  She takes Lantus  70 units nightly and takes NovoLog  ISS in addition to metformin .  Her blood glucose has been ranging around 150-200 most of the time, with morning readings above 200. She had Dexcom, but has run out of her sensors.  Lumbar radiculopathy: She had L4-5 microdiscectomy in 06/24.  She had been doing well since the surgery till recently.  She is currently taking Norco as needed for severe pain and Lyrica  for radicular symptoms.  She reports bilateral thigh area pain, which is constant for the last 5 days, dull, and worse with standing/walking.  She feels weak in bilateral LE and has balance problem while walking.  Of note, her dose of Lyrica  was reduced to 100 mg QD from BID in 01/25 and she has felt worsening of pain since then.  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   Anaphylactic shock, unspecified, subsequent encounter    Anemia, iron deficiency  02/14/2015   patient is not aware this dx   Angio-edema    Arthritis    Asthma    Mild intermittent asthma   Asymptomatic varicose veins of right lower extremity    Chronic obstructive pulmonary disease with (acute) exacerbation (HCC)    patient denies this dx   Chronic rhinitis 05/21/2018   COVID-19 2019   Diabetes mellitus    type 2   Essential (primary) hypertension    Family history of colon cancer 09/03/2012   Fatty liver    Gastro-esophageal reflux disease without esophagitis    GERD (gastroesophageal reflux disease)    Hearing loss    wears hearing aid   History of colonic polyps 08/26/2017   Hypercholesteremia    Hypertension    Hypothyroidism    Low back pain    and left leg pain   Mass of finger, right s/p surgical excision (mucoid cyst) 04/17/18 04/22/2018   Mucous cyst of digit of right hand    Obesity, unspecified    Other adverse food reactions, not elsewhere classified, initial encounter    Personal history of noncompliance with medical treatment, presenting hazards to health 05/18/2015   Rash and other nonspecific skin eruption    Restless legs syndrome    patient denies this dx   Sleep apnea    pt siad, i had small amount but could  not tolerate CPAP and they said it was ok since it was mild.   Unspecified asthma, uncomplicated    Viral URI 07/13/2022   Zoster without complications     Past Surgical History:  Procedure Laterality Date   ABDOMINAL HYSTERECTOMY     BACK SURGERY  11/26/2022   BIOPSY  11/08/2022   Procedure: BIOPSY;  Surgeon: Shaaron Lamar HERO, MD;  Location: [facility];  Service: Endoscopy;;   CATARACT EXTRACTION W/PHACO Left 03/09/2022   Procedure: CATARACT EXTRACTION PHACO AND INTRAOCULAR LENS PLACEMENT (IOC);  Surgeon: Harrie Agent, MD;  Location: [facility];  Service: Ophthalmology;  Laterality: Left;  CDE 7.89   CATARACT EXTRACTION W/PHACO Right 03/23/2022   Procedure: CATARACT EXTRACTION PHACO AND INTRAOCULAR LENS PLACEMENT (IOC);   Surgeon: Harrie Agent, MD;  Location: [facility];  Service: Ophthalmology;  Laterality: Right;  CDE: 2.63   CHOLECYSTECTOMY     COLONOSCOPY N/A 09/22/2012   RMR: colonic polyps -removed as described above. tubular adenoma, next TCS 09/2017   COLONOSCOPY WITH PROPOFOL  N/A 09/23/2017   Surgeon: Shaaron Lamar HERO, MD; internal hemorrhoids, three 5-6 mm polyps removed, diverticulosis in sigmoid and descending colon.  Pathology with tubular adenomas and rectal hyperplastic polyp.  Recommended 5 year surveillance.   COLONOSCOPY WITH PROPOFOL  N/A 11/08/2022   Procedure: COLONOSCOPY WITH PROPOFOL ;  Surgeon: Shaaron Lamar HERO, MD;  Location: [facility];  Service: Endoscopy;  Laterality: N/A;  815am, asa 3   CYST EXCISION Left 07/19/2016   Procedure: CYST REMOVAL LEFT RING FINGER;  Surgeon: Taft FORBES Minerva, MD;  Location: [facility];  Service: Orthopedics;  Laterality: Left;   CYST REMOVAL LEG Right    foot   ESOPHAGOGASTRODUODENOSCOPY N/A 10/21/2014   RMR: Small hiatal hernia; otherwise normal EGD status post passage of a Maloney dilator.    ESOPHAGOGASTRODUODENOSCOPY (EGD) WITH PROPOFOL  N/A 11/08/2022   Procedure: ESOPHAGOGASTRODUODENOSCOPY (EGD) WITH PROPOFOL ;  Surgeon: Shaaron Lamar HERO, MD;  [facility]: [facility];  Service: Endoscopy;  Laterality: N/A;   EXCISION MASS UPPER EXTREMETIES Right 04/17/2018   Procedure: EXCISION MASS UPPER EXTREMETIES right ring finger;  Surgeon: Minerva Taft FORBES, MD;  Location: [facility];  Service: Orthopedics;  Laterality: Right;   LUMBAR LAMINECTOMY/DECOMPRESSION MICRODISCECTOMY Bilateral 11/26/2022   Procedure: Microdiscectomy - bilateral - Lumbar four-Lumbar five;  Surgeon: Louis Shove, MD;  Location: [facility];  Service: Neurosurgery;  Laterality: Bilateral;   MALONEY DILATION N/A 10/21/2014   Procedure: AGAPITO DILATION;  Surgeon: Lamar HERO Shaaron, MD;  [facility]: [facility];  Service: Endoscopy;  Laterality: N/A;   MALONEY DILATION N/A 11/08/2022   Procedure: AGAPITO  DILATION;  Surgeon: Shaaron Lamar HERO, MD;  [facility]: [facility];  Service: Endoscopy;  Laterality: N/A;   MIDDLE EAR SURGERY Right    patched hole in ear drum   POLYPECTOMY  09/23/2017   Procedure: POLYPECTOMY;  Surgeon: Shaaron Lamar HERO, MD;  [facility]: [facility];  Service: Endoscopy;;  polyp hepatic flexure polyp cs, splenic flexure polyp cs, rectal polyp cs   POLYPECTOMY  11/08/2022   Procedure: POLYPECTOMY;  Surgeon: Shaaron Lamar HERO, MD;  Location: [facility];  Service: Endoscopy;;   TOTAL ABDOMINAL HYSTERECTOMY      Family History  Problem Relation Age of Onset   Colon cancer Mother        diagnosed age 40, underwent surgical resection, metastatic disease several years later   Diabetes Mother    Heart attack Father    Diabetes Sister    Hypothyroidism Brother  Diabetes Sister    Diabetes Sister    Diabetes Sister    Hypothyroidism Sister    Allergic rhinitis Neg Hx    Angioedema Neg Hx    Asthma Neg Hx    Atopy Neg Hx    Eczema Neg Hx    Immunodeficiency Neg Hx    Urticaria Neg Hx     Social History   Socioeconomic History   Marital status: Divorced    Spouse name: Not on file   Number of children: 2   Years of education: Not on file   Highest education level: Not on file  Occupational History   Not on file  Tobacco Use   Smoking status: Never   Smokeless tobacco: Never  Vaping Use   Vaping status: Never Used  Substance and Sexual Activity   Alcohol use: No    Alcohol/week: 0.0 standard drinks of alcohol   Drug use: No   Sexual activity: Not Currently    Birth control/protection: Surgical    Comment: Hysterectomy  Other Topics Concern   Not on file  Social History Narrative   Lives with grandson (since born)      Enjoys: vacation, travel, concerts      Diet: eats all food groups    Caffeine: diet Pepsi daily    Water :  8 cups daily       Wears seat belt    Does use phone   Smoke Retail buyer in safe area    Social Drivers of  Health   Financial Resource Strain: Low Risk  (12/02/2019)   Overall Financial Resource Strain (CARDIA)    Difficulty of Paying Living Expenses: Not hard at all  Food Insecurity: No Food Insecurity (12/02/2019)   Hunger Vital Sign    Worried About Running Out of Food in the Last Year: Never true    Ran Out of Food in the Last Year: Never true  Transportation Needs: No Transportation Needs (12/02/2019)   PRAPARE - Administrator, Civil Service (Medical): No    Lack of Transportation (Non-Medical): No  Physical Activity: Insufficiently Active (12/02/2019)   Exercise Vital Sign    Days of Exercise per Week: 2 days    Minutes of Exercise per Session: 20 min  Stress: Not on file  Social Connections: Moderately Isolated (12/02/2019)   Social Connection and Isolation Panel    Frequency of Communication with Friends and Family: More than three times a week    Frequency of Social Gatherings with Friends and Family: More than three times a week    Attends Religious Services: More than 4 times per year    Active Member of [employer] or Organizations: No    Attends Banker Meetings: Never    Marital Status: Divorced  Catering manager Violence: Not At Risk (12/02/2019)   Humiliation, Afraid, Rape, and Kick questionnaire    Fear of Current or Ex-Partner: No    Emotionally Abused: No    Physically Abused: No    Sexually Abused: No    Outpatient Medications Prior to Visit  Medication Sig Dispense Refill   Accu-Chek FastClix Lancets MISC TEST FOUR TIMES DAILY 102 each 0   ACCU-CHEK GUIDE TEST test strip USE TO TEST BLOOD GLUCOSE FOUR TIMES DAILY. 100 strip 0   albuterol  (VENTOLIN  HFA) 108 (90 Base) MCG/ACT inhaler INHALE 4 PUFFS INTO THE LUNGS EVERY 6 HOURS AS NEEDED FOR WHEEZING OR SHORTNESS OF BREATH 18 g 1   aspirin EC  81 MG tablet Take 81 mg by mouth daily.     cetirizine  (ZYRTEC ) 10 MG tablet Take 1 tablet (10 mg total) by mouth daily. 30 tablet 5   Cholecalciferol  (VITAMIN  D-3) 1000 UNITS CAPS Take 1,000 Units by mouth daily.      cyclobenzaprine  (FLEXERIL ) 10 MG tablet TAKE ONE TABLET (10MG  TOTAL) BY MOUTH THREE TIMES DAILY AS NEEDED FOR MUSCLE SPASMS 30 tablet 0   diclofenac  Sodium (VOLTAREN ) 1 % GEL APPLY 2 GRAMS TO AFFECTED AREAS 2 TIMES A DAY AS NEEDED 100 g 0   EPINEPHrine  0.3 mg/0.3 mL IJ SOAJ injection Inject 0.3 mg into the muscle as needed for anaphylaxis. 2 each 1   fluticasone  (FLONASE ) 50 MCG/ACT nasal spray Place 2 sprays into both nostrils daily as needed. 16 g 5   HYDROcodone -acetaminophen  (NORCO) 7.5-325 MG tablet Take 1 tablet by mouth 4 (four) times daily as needed.     insulin  aspart (NOVOLOG  FLEXPEN) 100 UNIT/ML FlexPen Inject 20-26 Units into the skin 3 (three) times daily with meals. 60 mL 3   Insulin  Pen Needle (SURE COMFORT PEN NEEDLES) 31G X 8 MM MISC USE FOUR TIMES DAILY 100 each 2   ketoconazole  (NIZORAL ) 2 % cream APPLY TO AFFECTED AREAS TWICE DAILY 60 g 2   LANTUS  SOLOSTAR 100 UNIT/ML Solostar Pen Inject 70 Units into the skin at bedtime. 60 mL 3   levothyroxine  (SYNTHROID ) 75 MCG tablet TAKE ONE (1) TABLET BY MOUTH EVERY DAY BEFORE BREAKFAST 90 tablet 3   metFORMIN  (GLUCOPHAGE -XR) 500 MG 24 hr tablet Take 1 tablet (500 mg total) by mouth 2 (two) times daily with a meal. 180 tablet 3   olmesartan  (BENICAR ) 40 MG tablet TAKE ONE TABLET (40MG  TOTAL) BY MOUTH DAILY 30 tablet 3   pantoprazole  (PROTONIX ) 40 MG tablet TAKE ONE TABLET (40MG  TOTAL) BY MOUTH DAILY 30 tablet 11   pregabalin  (LYRICA ) 100 MG capsule Take 1 capsule (100 mg total) by mouth 2 (two) times daily. 60 capsule 3   Semaglutide , 1 MG/DOSE, (OZEMPIC , 1 MG/DOSE,) 4 MG/3ML SOPN Inject 1 mg into the skin once a week. 9 mL 3   simvastatin  (ZOCOR ) 10 MG tablet TAKE ONE TABLET (10MG  TOTAL) BY MOUTH DAILY AT 6PM 90 tablet 1   vitamin B-12 (CYANOCOBALAMIN ) 500 MCG tablet Take 500 mcg by mouth daily.     No facility-administered medications prior to visit.    Allergies  Allergen  Reactions   Kiwi Extract Anaphylaxis, Swelling and Palpitations   Other Anaphylaxis    Walnuts (avoids all tree nuts)   Statins     Transaminase elevation    ROS Review of Systems  Constitutional:  Negative for chills and fever.  HENT:  Negative for congestion, sinus pressure and sinus pain.   Eyes:  Negative for pain and discharge.  Respiratory:  Negative for cough and shortness of breath.   Cardiovascular:  Negative for chest pain and palpitations.  Gastrointestinal:  Negative for abdominal pain, diarrhea, nausea and vomiting.  Endocrine: Negative for polydipsia and polyuria.  Genitourinary:  Negative for dysuria and hematuria.  Musculoskeletal:  Positive for arthralgias and back pain. Negative for neck pain and neck stiffness.       Bilateral thigh pain  Skin:  Positive for color change (Mole over back).  Neurological:  Negative for dizziness and weakness.  Psychiatric/Behavioral:  Positive for sleep disturbance. Negative for agitation and behavioral problems.       Objective:    Physical Exam Vitals reviewed.  Constitutional:  General: She is not in acute distress.    Appearance: She is obese. She is not diaphoretic.  HENT:     Head: Normocephalic and atraumatic.     Nose: No congestion.     Right Sinus: Frontal sinus tenderness present.     Left Sinus: Frontal sinus tenderness present.     Mouth/Throat:     Mouth: Mucous membranes are moist.     Pharynx: No posterior oropharyngeal erythema.  Eyes:     General: No scleral icterus.    Extraocular Movements: Extraocular movements intact.  Cardiovascular:     Rate and Rhythm: Normal rate and regular rhythm.     Heart sounds: Normal heart sounds. No murmur heard. Pulmonary:     Breath sounds: Normal breath sounds. No wheezing or rales.  Musculoskeletal:     Cervical back: Neck supple. No tenderness.     Lumbar back: Tenderness present. Positive left straight leg raise test.     Right lower leg: No edema.      Left lower leg: No edema.  Skin:    General: Skin is warm.  Neurological:     General: No focal deficit present.     Mental Status: She is alert and oriented to person, place, and time.     Motor: Weakness (B/l LE - 4/5) present.  Psychiatric:        Mood and Affect: Mood normal.        Behavior: Behavior normal.     BP (!) 151/88   Pulse 94   Ht 5' 5 (1.651 m)   Wt 224 lb 9.6 oz (101.9 kg)   SpO2 93%   BMI 37.38 kg/m  Wt Readings from Last 3 Encounters:  02/17/24 224 lb 9.6 oz (101.9 kg)  11/27/23 225 lb (102.1 kg)  11/25/23 224 lb 9.6 oz (101.9 kg)    Lab Results  Component Value Date   TSH 2.300 07/02/2023   Lab Results  Component Value Date   WBC 11.0 (H) 06/01/2023   HGB 12.9 06/01/2023   HCT 41.1 06/01/2023   MCV 89.5 06/01/2023   PLT 282 06/01/2023   Lab Results  Component Value Date   NA 140 11/25/2023   K 4.4 11/25/2023   CO2 20 11/25/2023   GLUCOSE 171 (H) 11/25/2023   BUN 19 11/25/2023   CREATININE 1.00 11/25/2023   BILITOT 0.3 10/17/2023   ALKPHOS 108 10/17/2023   AST 17 10/17/2023   ALT 24 10/17/2023   PROT 7.0 10/17/2023   ALBUMIN 4.6 10/17/2023   CALCIUM 10.0 11/25/2023   ANIONGAP 11 06/01/2023   EGFR 64 11/25/2023   Lab Results  Component Value Date   CHOL 153 10/17/2023   Lab Results  Component Value Date   HDL 66 10/17/2023   Lab Results  Component Value Date   LDLCALC 70 10/17/2023   Lab Results  Component Value Date   TRIG 90 10/17/2023   Lab Results  Component Value Date   CHOLHDL 2.3 10/17/2023   Lab Results  Component Value Date   HGBA1C 8.8 (A) 11/27/2023      Assessment & Plan:   Problem List Items Addressed This Visit   None     No orders of the defined types were placed in this encounter.   Follow-up: No follow-ups on file.    Suzzane MARLA Blanch, MD

## 2024-02-17 NOTE — Assessment & Plan Note (Addendum)
 Last lipid profile reviewed Simvastatin  10 mg QD - check CMP and lipid profile

## 2024-02-17 NOTE — Patient Instructions (Signed)
 Please start taking Augmentin  as prescribed.  Please use Flonase  for nasal congestion and allergies.  Please use sinus inhaler or vaporizer for nasal congestion.  Please continue to take medications as prescribed.  Please continue to follow low carb diet and perform moderate exercise/walking at least 150 mins/week.  Please get fasting blood tests done before the next visit.

## 2024-02-18 ENCOUNTER — Ambulatory Visit: Admitting: Podiatry

## 2024-02-18 ENCOUNTER — Encounter: Payer: Self-pay | Admitting: Podiatry

## 2024-02-18 ENCOUNTER — Telehealth: Payer: Self-pay | Admitting: Lab

## 2024-02-18 DIAGNOSIS — B351 Tinea unguium: Secondary | ICD-10-CM | POA: Diagnosis not present

## 2024-02-18 DIAGNOSIS — E1165 Type 2 diabetes mellitus with hyperglycemia: Secondary | ICD-10-CM

## 2024-02-18 DIAGNOSIS — Z794 Long term (current) use of insulin: Secondary | ICD-10-CM

## 2024-02-18 NOTE — Telephone Encounter (Signed)
 Patient would like to speak to you before her appointment today.

## 2024-02-19 ENCOUNTER — Ambulatory Visit: Admitting: Allergy & Immunology

## 2024-02-19 NOTE — Assessment & Plan Note (Signed)
 Persistent symptoms despite OTC decongestants and antitussive  Started empiric Augmentin  Mucinex PRN for cough Advised to use sinus inhaler or vaporizer for nasal congestion

## 2024-02-20 ENCOUNTER — Ambulatory Visit: Admitting: Allergy

## 2024-02-21 ENCOUNTER — Other Ambulatory Visit: Payer: Self-pay

## 2024-02-21 ENCOUNTER — Encounter: Payer: Self-pay | Admitting: Allergy & Immunology

## 2024-02-21 ENCOUNTER — Ambulatory Visit: Admitting: Allergy & Immunology

## 2024-02-21 VITALS — BP 118/68 | HR 87 | Temp 97.7°F | Resp 16 | Ht 64.57 in | Wt 223.4 lb

## 2024-02-21 DIAGNOSIS — J310 Chronic rhinitis: Secondary | ICD-10-CM | POA: Diagnosis not present

## 2024-02-21 DIAGNOSIS — J4520 Mild intermittent asthma, uncomplicated: Secondary | ICD-10-CM

## 2024-02-21 DIAGNOSIS — J0100 Acute maxillary sinusitis, unspecified: Secondary | ICD-10-CM

## 2024-02-21 DIAGNOSIS — K219 Gastro-esophageal reflux disease without esophagitis: Secondary | ICD-10-CM

## 2024-02-21 DIAGNOSIS — T7800XD Anaphylactic reaction due to unspecified food, subsequent encounter: Secondary | ICD-10-CM

## 2024-02-21 MED ORDER — METHYLPREDNISOLONE ACETATE 40 MG/ML IJ SUSP
40.0000 mg | Freq: Once | INTRAMUSCULAR | Status: AC
  Administered 2024-02-21: 40 mg via INTRAMUSCULAR

## 2024-02-21 NOTE — Progress Notes (Signed)
 FOLLOW UP  Date of Service/Encounter:  02/21/24   Assessment:   Mild intermittent asthma, uncomplicated - with worsening lung function today   Adverse food reaction (walnut, kiwi, seafood)    Gastroesophageal reflux disease - controlled with a pantoprazole    Chronic nonallergic rhinitis - with negative environmental IgE panel in 2019   Plan/Recommendations:   1. Mild intermittent asthma, uncomplicated - Lung testing looks amazing today.  - We are not going to make any medications.  - We will continue with the albuterol  4 puffs as needed for coughing and wheezing.  - We are not going to make any changes at this time.  - Continue with albuterol  4 puffs every 4-6 hours as needed.   - There is no need for a controller medication at this time.   2. Gastroesophageal reflux disease - Continue with pantoprazole  40mg  daily.  3. 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 - EpiPen  updated today.   4. Chronic rhinitis - with current sinus infection - Continue fluticasone  one spray per nostril daily as needed.  - Continue with cetirizine  10mg  daily as needed.  5. Sinus infection - Definitely continue with the Augmentin  as you are doing.  - DepoMedrol 40mg  given today. - DepoMedrol will last for 3-4 days in your system.  - Add on Tessalon  pearls that you have at home.   5. Return in about 6 months (around 08/20/2024). You can have the follow up appointment with Dr. Iva or a Nurse Practicioner (our Nurse Practitioners are excellent and always have Physician oversight!).   Subjective:   Sandy Valencia is a 63 y.o. female presenting today for follow up of  Chief Complaint  Patient presents with   Follow-up    Ears stopped up and she states that when she talk she feels like she is in a tunnel. Taken an antibotic right now for sinus infection.    Sandy Valencia has a history of the following: Patient Active Problem List   Diagnosis Date Noted    Sensorineural hearing loss, bilateral 10/31/2023   Primary osteoarthritis of right knee 10/17/2023   Acute cystitis without hematuria 06/19/2023   Acute bronchitis 05/24/2023   Chronic mastoiditis of right side 03/24/2023   Varicose veins of right lower extremity 01/22/2023   Acute pain of left lower extremity 10/25/2022   Primary insomnia 09/18/2022   Encounter for examination following treatment at hospital 09/18/2022   Acute recurrent frontal sinusitis 07/20/2022   Lumbar radiculopathy 03/08/2022   Statin intolerance 01/19/2022   Encounter for general adult medical examination with abnormal findings 01/16/2022   Lumbar spondylosis 09/05/2021   Allergic rhinitis due to allergen 04/17/2021   Seborrheic keratosis 04/17/2021   Primary osteoarthritis involving multiple joints 12/02/2019   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   Long-term current use of opiate analgesic 09/08/2019   Neck pain 09/07/2019   Mild intermittent asthma without complication 05/21/2018   Gastroesophageal reflux disease 05/21/2018   History of colonic polyps 08/26/2017   Hypothyroidism 04/22/2017   Mixed hyperlipidemia 12/31/2016   Type 2 diabetes mellitus with hyperglycemia (HCC) 05/18/2015   Essential hypertension 05/18/2015   Morbid obesity (HCC) 05/18/2015   Fatty liver 02/14/2015   Hiatal hernia    Dysphagia 10/11/2014    History obtained from: chart review and patient.  Discussed the use of AI scribe software for clinical note transcription with the patient and/or guardian, who gave verbal consent to proceed.  Sandy Valencia is a 63 y.o.  female presenting for a follow up visit.  She was last seen in March 2025.  At that time, lung testing looked great.  We did not make any medication changes.  Will continue with albuterol  as needed.  For her GERD, we will continue with pantoprazole  40 mg daily.  We also continue with avoidance of kiwi and walnuts as well as seafood.  EpiPen  was up-to-date.   For her rhinitis, we continue with Flonase  as well as cetirizine .  Since last visit, she has done relatively well.  She began experiencing symptoms of a sinus infection on February 08, 2024. She initiated treatment with Augmentin  but only completed three doses, resulting in partial relief. Her current symptoms include nasal congestion, a sensation of 'talking in a barrel,' and occasional productive coughs that help clear her throat.  She uses Fluticasone  nasal spray and finds 'cough pills' prescribed to her daughter helpful. She has been blowing her nose with white mucus. She is concerned about her blood sugar levels due to the potential impact of steroids and has been monitoring her sugar levels.  She did not sleep well the previous night due to packing for an upcoming cruise and is worried about her ability to enjoy the trip given her current symptoms. She frequently takes cruises, sometimes twice a year, and is planning an eight-day cruise with four friends to several destinations including [place], [place] and the [place].    Asthma/Respiratory Symptom History: She has done well before her current illnesses. Her breathing has been under good control. She denies any symptoms. She is not coughing or wheezing at all. She will have some issues with viral URIs. When she gets bronchitis, she typically gets treated ith steroids and antibiotics. This is very rare, however. She has been breathing fairly well. She has not used her albuterol  in a while. She has not had steroids or antibiotics. She did have antibiotics before her surgery. She has not paid a lot for albuterol .   Allergic Rhinitis Symptom History: She has the cetirizine  that she is using routinely. She has not bene using her nasal spray. She does have some intermittent watery eyes. She is overall doing very.   Food Allergy  Symptom History: She continues to avoid walnuts and kiwi. She denies any accidental exposures at all.   Two weeks ago, she was cracking walnuts. She did occasionally eat them as she shelled them. She did fine with this. She has an up to date EpiPen . Otherwise no accidental congestion. We have talked about doing a challenge, but she has not been interested in this    GERD Symptom History: She remains on the Protonix  once daily. Symptoms are well controlled on this regimen.   Otherwise, there have been no changes to her past medical history, surgical history, family history, or social history.    Review of systems otherwise negative other than that mentioned in the HPI.    Objective:   Blood pressure 118/68, pulse 87, temperature 97.7 F (36.5 C), temperature source Temporal, resp. rate 16, height 5' 4.57 (1.64 m), weight 223 lb 6.4 oz (101.3 kg), SpO2 96%. Body mass index is 37.68 kg/m.    Physical Exam Vitals reviewed.  Constitutional:      Appearance: She is well-developed.     Comments: Extremely talkative. Boisterous.   HENT:     Head: Normocephalic and atraumatic.     Right Ear: Tympanic membrane, ear canal and external ear normal.     Left Ear: Tympanic membrane, ear  canal and external ear normal.     Nose: No nasal deformity, septal deviation, mucosal edema or rhinorrhea.     Right Turbinates: Enlarged, swollen and pale.     Left Turbinates: Enlarged, swollen and pale.     Right Sinus: Frontal sinus tenderness present. No maxillary sinus tenderness.     Left Sinus: Frontal sinus tenderness present. No maxillary sinus tenderness.     Comments: No nasal polyps noted.     Mouth/Throat:     Lips: Pink.     Mouth: Mucous membranes are moist. Mucous membranes are not pale and not dry.     Pharynx: Uvula midline.     Comments: Cobblestoning present.  Eyes:     General: Lids are normal. Allergic shiner present.        Right eye: No discharge.        Left eye: No discharge.     Conjunctiva/sclera: Conjunctivae normal.     Right eye: Right conjunctiva is not injected. No  chemosis.    Left eye: Left conjunctiva is not injected. No chemosis.    Pupils: Pupils are equal, round, and reactive to light.  Cardiovascular:     Rate and Rhythm: Normal rate and regular rhythm.     Heart sounds: Normal heart sounds.  Pulmonary:     Effort: Pulmonary effort is normal. No tachypnea, accessory muscle usage or respiratory distress.     Breath sounds: Normal breath sounds. No wheezing, rhonchi or rales.     Comments: Moving air well in all lung fields. Chest:     Chest wall: No tenderness.  Lymphadenopathy:     Cervical: No cervical adenopathy.  Skin:    Coloration: Skin is not pale.     Findings: No abrasion, erythema, petechiae or rash. Rash is not papular, urticarial or vesicular.  Neurological:     Mental Status: She is alert.  Psychiatric:        Behavior: Behavior is cooperative.      Diagnostic studies:    Spirometry: results normal (FEV1: 2.01/80%, FVC: 2.47/77%, FEV1/FVC: 81%).    Spirometry consistent with normal pattern.    Allergy  Studies: none       Marty Shaggy, MD  [facility] 

## 2024-02-21 NOTE — Patient Instructions (Addendum)
 1. Mild intermittent asthma, uncomplicated - Lung testing looks amazing today.  - We are not going to make any medications.  - We will continue with the albuterol  4 puffs as needed for coughing and wheezing.  - We are not going to make any changes at this time.  - Continue with albuterol  4 puffs every 4-6 hours as needed.   - There is no need for a controller medication at this time.   2. Gastroesophageal reflux disease - Continue with pantoprazole  40mg  daily.  3. 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 - EpiPen  updated today.   4. Chronic rhinitis - with current sinus infection - Continue fluticasone  one spray per nostril daily as needed.  - Continue with cetirizine  10mg  daily as needed.  5. Sinus infection - Definitely continue with the Augmentin  as you are doing.  - DepoMedrol 40mg  given today. - DepoMedrol will last for 3-4 days in your system.  - Add on Tessalon  pearls that you have at home.   5. Return in about 6 months (around 08/20/2024). You can have the follow up appointment with Dr. Iva or a Nurse Practicioner (our Nurse Practitioners are excellent and always have Physician oversight!).    Please inform us  of any Emergency Department visits, hospitalizations, or changes in symptoms. Call us  before going to the ED for breathing or allergy  symptoms since we might be able to fit you in for a sick visit. Feel free to contact us  anytime with any questions, problems, or concerns.  It was a pleasure to see you again today! We always love seeing you!   Websites that have reliable patient information: 1. American Academy of Asthma, Allergy , and Immunology: www.aaaai.org 2. Food Allergy  Research and Education (FARE): foodallergy.org 3. Mothers of Asthmatics: http://www.asthmacommunitynetwork.org 4. [employer] of Allergy , Asthma, and Immunology: www.acaai.org      "Like" us  on Facebook and Instagram for our latest updates!      A healthy  democracy works best when [employer] participate! Make sure you are registered to vote! If you have moved or changed any of your contact information, you will need to get this updated before voting! Scan the QR codes below to learn more!

## 2024-02-23 ENCOUNTER — Encounter: Payer: Self-pay | Admitting: Podiatry

## 2024-02-23 NOTE — Progress Notes (Signed)
  Subjective:  Patient ID: Sandy Valencia, female    DOB: [birth year],  MRN: [id number]  Sandy Valencia presents to clinic today for preventative diabetic foot care for painful elongated mycotic toenails 1-5 bilaterally which are tender when wearing enclosed shoe gear. Pain is relieved with periodic professional debridement.  Chief Complaint  Patient presents with   [facility]    Rm16 Dibetic foot care/ A1c 8.8/ Dr, Suzzane Blanch last visit September 2025   New problem(s): None.   PCP is Blanch Suzzane POUR, MD.  Allergies  Allergen Reactions   Kiwi Extract Anaphylaxis, Swelling and Palpitations   Other Anaphylaxis    Walnuts (avoids all tree nuts)   Statins     Transaminase elevation    Review of Systems: Negative except as noted in the HPI.  Objective: No changes noted in today's physical examination. There were no vitals filed for this visit. Sandy Valencia is a pleasant 63 y.o. female in NAD. AAO x 3.  Vascular Examination: CFT less than 3 seconds. DP pulses palpable b/l. PT pulses nonpalpable b/l. Digital hair absent. Skin temperature gradient warm to warn b/l. No ischemia or gangrene. No cyanosis or clubbing noted b/l.    Neurological Examination: Sensation grossly intact b/l with 10 gram monofilament.  Dermatological Examination: Pedal skin thin, shiny and atrophic b/l. No open wounds. No interdigital macerations.   Toenails 1-5 b/l thick, discolored, elongated with subungual debris and pain on dorsal palpation.   No hyperkeratotic nor porokeratotic lesions.  Musculoskeletal Examination: Normal muscle strength 5/5 to all lower extremity muscle groups bilaterally. Hammertoe(s) 2-5 b/l.SABRA No pain, crepitus or joint limitation noted with ROM b/l LE.  Patient ambulates independently without assistive aids.  Radiographs: None  Last A1c:      Latest Ref Rng & Units 11/27/2023    9:16 AM 07/16/2023    8:24 AM 03/05/2023    8:43 AM  Hemoglobin A1C  Hemoglobin-A1c 4.0 - 5.6 % 8.8  9.5   8.5    Assessment/Plan: 1. Onychomycosis [B35.1]   2. Type 2 diabetes mellitus with hyperglycemia, with long-term current use of insulin  [facility])     Patient was evaluated and treated. All patient's and/or POA's questions/concerns addressed on today's visit. Toenails 1-5 debrided in length and girth without incident. Treatment was provided by assistant Mable FABIENE Cherry under my supervision. Continue foot and shoe inspections daily. Monitor blood glucose per PCP/Endocrinologist's recommendations. Continue soft, supportive shoe gear daily. Report any pedal injuries to medical professional. Call office if there are any questions/concerns.  Return in about 3 months (around 05/19/2024).  Delon LITTIE Merlin, DPM      [facility] LOCATION: [address], [place] [address]                   Office [phone]   [facility] LOCATION: [address] [place], [place] [address] Office [phone]

## 2024-03-02 ENCOUNTER — Other Ambulatory Visit: Payer: Self-pay | Admitting: Internal Medicine

## 2024-03-02 ENCOUNTER — Ambulatory Visit

## 2024-03-02 ENCOUNTER — Ambulatory Visit: Payer: Self-pay

## 2024-03-02 VITALS — BP 124/67 | HR 78 | Ht 65.0 in | Wt 226.0 lb

## 2024-03-02 DIAGNOSIS — M5416 Radiculopathy, lumbar region: Secondary | ICD-10-CM

## 2024-03-02 DIAGNOSIS — R052 Subacute cough: Secondary | ICD-10-CM | POA: Diagnosis not present

## 2024-03-02 DIAGNOSIS — N760 Acute vaginitis: Secondary | ICD-10-CM | POA: Diagnosis not present

## 2024-03-02 MED ORDER — FLUCONAZOLE 150 MG PO TABS: 150.0000 mg | ORAL_TABLET | Freq: Once | ORAL | 0 refills | Status: AC

## 2024-03-02 NOTE — Telephone Encounter (Signed)
 FYI Only or Action Required?: FYI only for provider.  Patient was last seen in primary care on 02/17/2024 by Tobie Suzzane POUR, MD.  Called Nurse Triage reporting Recurrent UTI.  Symptoms began several days ago.  Interventions attempted: Rest, hydration, or home remedies.  Symptoms are: gradually worsening.  Triage Disposition: See Physician Within 24 Hours  Patient/caregiver understands and will follow disposition?: Yes       Copied from CRM #[id number]. Topic: Clinical - Red Word Triage >> Mar 02, 2024  8:45 AM Gustabo D wrote: Pt took a antibiotic the last week and went on a cruise and thinks she has a UTI or yeast infection and having discharge and itching when she pee. Started maybe Wednesday last week. >> Mar 02, 2024  8:59 AM Willma R wrote: Patient was disconnected while waiting for a nurse.  Reason for Disposition  Urinating more frequently than usual (i.e., frequency) OR new-onset of the feeling of an urgent need to urinate (i.e., urgency)  Answer Assessment - Initial Assessment Questions 1. SYMPTOM: What's the main symptom you're concerned about? (e.g., frequency, incontinence)     Itching, discharge, urinary frequency Reports recent AV and d/c with abx-- and went on a cruise and just got back 2. ONSET: When did the  sx  start?     Thursday 3. PAIN: Is there any pain? If Yes, ask: How bad is it? (Scale: 1-10; mild, moderate, severe)     denies 4. CAUSE: What do you think is causing the symptoms?     UTI vs yeast 5. OTHER SYMPTOMS: Do you have any other symptoms? (e.g., blood in urine, fever, flank pain, pain with urination)     denies 6. PREGNANCY: Is there any chance you are pregnant? When was your last menstrual period?     N/a  Protocols used: Urinary Symptoms-A-AH

## 2024-03-02 NOTE — Assessment & Plan Note (Signed)
 Persistent symptoms likely due to sinus infection. Augmentin  and steroid injection provided temporary relief. Current treatment with cough pearls and nasal spray offers limited benefit. Mucinex considered with caution due to potential blood sugar impact. - Encouraged use of plain Mucinex (guaifenesin) to aid congestion, ensuring no blood sugar impact. - Continue nasal spray as advised. - Increase fluid intake.

## 2024-03-02 NOTE — Telephone Encounter (Signed)
 Appt made.

## 2024-03-02 NOTE — Progress Notes (Signed)
 Established Patient Office Visit  Subjective   Patient ID: Sandy Valencia, female    DOB: [birth year]  Age: 63 y.o. MRN: [id number]  Chief Complaint  Patient presents with   Medical Management of Chronic Issues    Pt here due to vaginal discharge and Urinary frequency, pt states that the antibiotic didn't help and it caused a yeast infection.     HPI Discussed the use of AI scribe software for clinical note transcription with the patient, who gave verbal consent to proceed.  History of Present Illness   Sandy Valencia is a 63 year old female who presents with persistent cough and vaginal itching following a recent cruise.  Cough - Persistent cough began prior to September 20th, before recent cruise - Initial treatment with antibiotics and a steroid injection provided only temporary relief - Cough occasionally clears with a forceful cough but recurs - Clear sputum production with coughing - No chest pain or post-nasal drainage - Mucinex day and night used sparingly due to blood sugar elevation - Started cough pearls yesterday  Vaginal pruritus - Vaginal itching began during cruise, around Thursday of that week - Itching is persistent, especially when wiping - No burning sensation - Suspects yeast infection, possibly related to pre-trip steroid injection - No dysuria  Impact on daily activities and medication considerations - Actively engaged in daily activities, including caring for grandchildren and attending events - Focused on weight management with a goal to lose 26 pounds - Cautious with medications that may affect blood glucose levels      Patient Active Problem List   Diagnosis Date Noted   Subacute cough 03/02/2024   Sensorineural hearing loss, bilateral 10/31/2023   Primary osteoarthritis of right knee 10/17/2023   Acute cystitis without hematuria 06/19/2023   Acute bronchitis 05/24/2023   Chronic mastoiditis of right side 03/24/2023   Varicose veins of right  lower extremity 01/22/2023   Acute pain of left lower extremity 10/25/2022   Primary insomnia 09/18/2022   Encounter for examination following treatment at hospital 09/18/2022   Acute recurrent frontal sinusitis 07/20/2022   Lumbar radiculopathy 03/08/2022   Statin intolerance 01/19/2022   Encounter for general adult medical examination with abnormal findings 01/16/2022   Lumbar spondylosis 09/05/2021   Allergic rhinitis due to allergen 04/17/2021   Seborrheic keratosis 04/17/2021   Primary osteoarthritis involving multiple joints 12/02/2019   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   Long-term current use of opiate analgesic 09/08/2019   Neck pain 09/07/2019   Mild intermittent asthma without complication 05/21/2018   Gastroesophageal reflux disease 05/21/2018   History of colonic polyps 08/26/2017   Hypothyroidism 04/22/2017   Mixed hyperlipidemia 12/31/2016   Type 2 diabetes mellitus with hyperglycemia (HCC) 05/18/2015   Essential hypertension 05/18/2015   Morbid obesity (HCC) 05/18/2015   Fatty liver 02/14/2015   Hiatal hernia    Dysphagia 10/11/2014    ROS    Objective:     BP 124/67   Pulse 78   Ht 5' 5 (1.651 m)   Wt 226 lb 0.6 oz (102.5 kg)   SpO2 94%   BMI 37.62 kg/m  BP Readings from Last 3 Encounters:  03/02/24 124/67  02/21/24 118/68  02/17/24 138/84   Wt Readings from Last 3 Encounters:  03/02/24 226 lb 0.6 oz (102.5 kg)  02/21/24 223 lb 6.4 oz (101.3 kg)  02/17/24 224 lb 9.6 oz (101.9 kg)     Physical Exam Vitals and nursing note reviewed.  Constitutional:      Appearance: Normal appearance. She is obese.  HENT:     Head: Normocephalic.  Eyes:     Extraocular Movements: Extraocular movements intact.     Pupils: Pupils are equal, round, and reactive to light.  Cardiovascular:     Rate and Rhythm: Normal rate and regular rhythm.  Pulmonary:     Effort: Pulmonary effort is normal.     Breath sounds: Normal breath sounds.   Musculoskeletal:     Cervical back: Normal range of motion and neck supple.  Neurological:     Mental Status: She is alert and oriented to person, place, and time.  Psychiatric:        Mood and Affect: Mood normal.        Thought Content: Thought content normal.     No results found for any visits on 03/02/24.    The 10-year ASCVD risk score (Arnett DK, et al., 2019) is: 7.3%    Assessment & Plan:   Problem List Items Addressed This Visit       Other   Subacute cough   Persistent symptoms likely due to sinus infection. Augmentin  and steroid injection provided temporary relief. Current treatment with cough pearls and nasal spray offers limited benefit. Mucinex considered with caution due to potential blood sugar impact. - Encouraged use of plain Mucinex (guaifenesin) to aid congestion, ensuring no blood sugar impact. - Continue nasal spray as advised. - Increase fluid intake.      Other Visit Diagnoses       Acute vaginitis    -  Primary   Itching consistent with yeast infection, likely exacerbated by recent antibiotics and steroid use. - Prescribed Diflucan , sent to [facility].   Relevant Medications   fluconazole  (DIFLUCAN ) 150 MG tablet      No follow-ups on file.    Leita Longs, FNP

## 2024-03-06 ENCOUNTER — Telehealth (INDEPENDENT_AMBULATORY_CARE_PROVIDER_SITE_OTHER): Payer: Self-pay | Admitting: Otolaryngology

## 2024-03-06 NOTE — Telephone Encounter (Signed)
 LVM for patient to call back to reschedule 11/26 apt - office closed

## 2024-03-24 ENCOUNTER — Other Ambulatory Visit: Payer: Self-pay | Admitting: Nurse Practitioner

## 2024-03-31 ENCOUNTER — Telehealth: Payer: Self-pay | Admitting: Nurse Practitioner

## 2024-03-31 DIAGNOSIS — E1165 Type 2 diabetes mellitus with hyperglycemia: Secondary | ICD-10-CM | POA: Diagnosis not present

## 2024-03-31 DIAGNOSIS — Z794 Long term (current) use of insulin: Secondary | ICD-10-CM | POA: Diagnosis not present

## 2024-03-31 DIAGNOSIS — E559 Vitamin D deficiency, unspecified: Secondary | ICD-10-CM | POA: Diagnosis not present

## 2024-03-31 DIAGNOSIS — E782 Mixed hyperlipidemia: Secondary | ICD-10-CM | POA: Diagnosis not present

## 2024-03-31 DIAGNOSIS — E039 Hypothyroidism, unspecified: Secondary | ICD-10-CM | POA: Diagnosis not present

## 2024-03-31 NOTE — Telephone Encounter (Signed)
 Pt wanted to ask Whitney a question and then stated she would talk to her at appt on 04/02/24

## 2024-04-01 ENCOUNTER — Ambulatory Visit: Payer: Self-pay | Admitting: Internal Medicine

## 2024-04-01 LAB — CBC WITH DIFFERENTIAL/PLATELET
Basophils Absolute: 0 x10E3/uL (ref 0.0–0.2)
Basos: 1 %
EOS (ABSOLUTE): 0.1 x10E3/uL (ref 0.0–0.4)
Eos: 2 %
Hematocrit: 36.7 % (ref 34.0–46.6)
Hemoglobin: 11.9 g/dL (ref 11.1–15.9)
Immature Grans (Abs): 0 x10E3/uL (ref 0.0–0.1)
Immature Granulocytes: 0 %
Lymphocytes Absolute: 1.5 x10E3/uL (ref 0.7–3.1)
Lymphs: 29 %
MCH: 28.5 pg (ref 26.6–33.0)
MCHC: 32.4 g/dL (ref 31.5–35.7)
MCV: 88 fL (ref 79–97)
Monocytes Absolute: 0.3 x10E3/uL (ref 0.1–0.9)
Monocytes: 7 %
Neutrophils Absolute: 3.3 x10E3/uL (ref 1.4–7.0)
Neutrophils: 61 %
Platelets: 250 x10E3/uL (ref 150–450)
RBC: 4.18 x10E6/uL (ref 3.77–5.28)
RDW: 13.6 % (ref 11.7–15.4)
WBC: 5.2 x10E3/uL (ref 3.4–10.8)

## 2024-04-01 LAB — CMP14+EGFR
ALT: 28 IU/L (ref 0–32)
AST: 21 IU/L (ref 0–40)
Albumin: 4.3 g/dL (ref 3.9–4.9)
Alkaline Phosphatase: 76 IU/L (ref 49–135)
BUN/Creatinine Ratio: 15 (ref 12–28)
BUN: 13 mg/dL (ref 8–27)
Bilirubin Total: 0.2 mg/dL (ref 0.0–1.2)
CO2: 24 mmol/L (ref 20–29)
Calcium: 9.8 mg/dL (ref 8.7–10.3)
Chloride: 104 mmol/L (ref 96–106)
Creatinine, Ser: 0.85 mg/dL (ref 0.57–1.00)
Globulin, Total: 2.4 g/dL (ref 1.5–4.5)
Glucose: 94 mg/dL (ref 70–99)
Potassium: 4.1 mmol/L (ref 3.5–5.2)
Sodium: 143 mmol/L (ref 134–144)
Total Protein: 6.7 g/dL (ref 6.0–8.5)
eGFR: 77 mL/min/1.73 (ref >59)

## 2024-04-01 LAB — COMPREHENSIVE METABOLIC PANEL WITH GFR
ALT: 31 IU/L (ref 0–32)
AST: 26 IU/L (ref 0–40)
Albumin: 4.4 g/dL (ref 3.9–4.9)
Alkaline Phosphatase: 79 IU/L (ref 49–135)
BUN/Creatinine Ratio: 15 (ref 12–28)
BUN: 13 mg/dL (ref 8–27)
Bilirubin Total: 0.2 mg/dL (ref 0.0–1.2)
CO2: 23 mmol/L (ref 20–29)
Calcium: 9.6 mg/dL (ref 8.7–10.3)
Chloride: 105 mmol/L (ref 96–106)
Creatinine, Ser: 0.84 mg/dL (ref 0.57–1.00)
Globulin, Total: 2.2 g/dL (ref 1.5–4.5)
Glucose: 97 mg/dL (ref 70–99)
Potassium: 4.1 mmol/L (ref 3.5–5.2)
Sodium: 144 mmol/L (ref 134–144)
Total Protein: 6.6 g/dL (ref 6.0–8.5)
eGFR: 79 mL/min/1.73 (ref >59)

## 2024-04-01 LAB — T4, FREE: Free T4: 1.29 ng/dL (ref 0.82–1.77)

## 2024-04-01 LAB — LIPID PANEL
Chol/HDL Ratio: 2.5 ratio (ref 0.0–4.4)
Cholesterol, Total: 137 mg/dL (ref 100–199)
HDL: 55 mg/dL (ref >39)
LDL Chol Calc (NIH): 69 mg/dL (ref 0–99)
Triglycerides: 63 mg/dL (ref 0–149)
VLDL Cholesterol Cal: 13 mg/dL (ref 5–40)

## 2024-04-01 LAB — TSH: TSH: 1.03 u[IU]/mL (ref 0.450–4.500)

## 2024-04-01 LAB — VITAMIN D 25 HYDROXY (VIT D DEFICIENCY, FRACTURES): Vit D, 25-Hydroxy: 36.4 ng/mL (ref 30.0–100.0)

## 2024-04-02 ENCOUNTER — Encounter: Payer: Self-pay | Admitting: Nurse Practitioner

## 2024-04-02 ENCOUNTER — Ambulatory Visit: Admitting: Nurse Practitioner

## 2024-04-02 VITALS — BP 130/76 | HR 68 | Ht 65.0 in | Wt 231.6 lb

## 2024-04-02 DIAGNOSIS — E1165 Type 2 diabetes mellitus with hyperglycemia: Secondary | ICD-10-CM

## 2024-04-02 DIAGNOSIS — E039 Hypothyroidism, unspecified: Secondary | ICD-10-CM

## 2024-04-02 DIAGNOSIS — Z7984 Long term (current) use of oral hypoglycemic drugs: Secondary | ICD-10-CM | POA: Diagnosis not present

## 2024-04-02 DIAGNOSIS — E559 Vitamin D deficiency, unspecified: Secondary | ICD-10-CM

## 2024-04-02 DIAGNOSIS — Z794 Long term (current) use of insulin: Secondary | ICD-10-CM | POA: Diagnosis not present

## 2024-04-02 DIAGNOSIS — E782 Mixed hyperlipidemia: Secondary | ICD-10-CM

## 2024-04-02 DIAGNOSIS — Z7985 Long-term (current) use of injectable non-insulin antidiabetic drugs: Secondary | ICD-10-CM

## 2024-04-02 DIAGNOSIS — I10 Essential (primary) hypertension: Secondary | ICD-10-CM

## 2024-04-02 LAB — POCT GLYCOSYLATED HEMOGLOBIN (HGB A1C): Hemoglobin A1C: 9.6 % — AB (ref 4.0–5.6)

## 2024-04-02 MED ORDER — OZEMPIC (0.25 OR 0.5 MG/DOSE) 2 MG/3ML ~~LOC~~ SOPN: 0.5000 mg | PEN_INJECTOR | SUBCUTANEOUS | 3 refills | Status: AC

## 2024-04-02 MED ORDER — LANTUS SOLOSTAR 100 UNIT/ML ~~LOC~~ SOPN: 80.0000 [IU] | PEN_INJECTOR | Freq: Every day | SUBCUTANEOUS | 3 refills | Status: AC

## 2024-04-02 MED ORDER — NOVOLOG FLEXPEN 100 UNIT/ML ~~LOC~~ SOPN: 20.0000 [IU] | PEN_INJECTOR | Freq: Three times a day (TID) | SUBCUTANEOUS | 3 refills | Status: AC

## 2024-04-02 MED ORDER — LEVOTHYROXINE SODIUM 75 MCG PO TABS: ORAL_TABLET | ORAL | 3 refills | Status: AC

## 2024-04-02 MED ORDER — METFORMIN HCL ER 500 MG PO TB24: 500.0000 mg | ORAL_TABLET | Freq: Two times a day (BID) | ORAL | 3 refills | Status: AC

## 2024-04-02 NOTE — Patient Instructions (Signed)

## 2024-04-02 NOTE — Progress Notes (Signed)
 04/02/2024   Endocrinology follow-up note   Subjective:    Patient ID: Sandy Valencia, female    DOB: 21-Dec-1960, PCP Tobie Suzzane POUR, MD   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   Anaphylactic shock, unspecified, subsequent encounter    Anemia, iron deficiency 02/14/2015   patient is not aware this dx   Angio-edema    Arthritis    Asthma    Mild intermittent asthma   Asymptomatic varicose veins of right lower extremity    Chronic obstructive pulmonary disease with (acute) exacerbation (HCC)    patient denies this dx   Chronic rhinitis 05/21/2018   COVID-19 2019   Diabetes mellitus    type 2   Essential (primary) hypertension    Family history of colon cancer 09/03/2012   Fatty liver    Gastro-esophageal reflux disease without esophagitis    GERD (gastroesophageal reflux disease)    Hearing loss    wears hearing aid   History of colonic polyps 08/26/2017   Hypercholesteremia    Hypertension    Hypothyroidism    Low back pain    and left leg pain   Mass of finger, right s/p surgical excision (mucoid cyst) 04/17/18 04/22/2018   Mucous cyst of digit of right hand    Obesity, unspecified    Other adverse food reactions, not elsewhere classified, initial encounter    Personal history of noncompliance with medical treatment, presenting hazards to health 05/18/2015   Rash and other nonspecific skin eruption    Restless legs syndrome    patient denies this dx   Sleep apnea    pt siad, i had small amount but could not tolerate CPAP and they said it was ok since it was mild.   Unspecified asthma, uncomplicated    Viral URI 07/13/2022   Zoster without complications    Past Surgical History:  Procedure Laterality Date   ABDOMINAL HYSTERECTOMY     BACK SURGERY  11/26/2022   BIOPSY  11/08/2022   Procedure: BIOPSY;  Surgeon: Shaaron Lamar HERO, MD;  [facility];   Service: Endoscopy;;   CATARACT EXTRACTION W/PHACO Left 03/09/2022   Procedure: CATARACT EXTRACTION PHACO AND INTRAOCULAR LENS PLACEMENT (IOC);  Surgeon: Harrie Agent, MD;  Location: [facility];  Service: Ophthalmology;  Laterality: Left;  CDE 7.89   CATARACT EXTRACTION W/PHACO Right 03/23/2022   Procedure: CATARACT EXTRACTION PHACO AND INTRAOCULAR LENS PLACEMENT (IOC);  Surgeon: Harrie Agent, MD;  Location: [facility];  Service: Ophthalmology;  Laterality: Right;  CDE: 2.63   CHOLECYSTECTOMY     COLONOSCOPY N/A 09/22/2012   RMR: colonic polyps -removed as described above. tubular adenoma, next TCS 09/2017   COLONOSCOPY WITH PROPOFOL  N/A 09/23/2017   Surgeon: Shaaron Lamar HERO, MD; internal hemorrhoids, three 5-6 mm polyps removed, diverticulosis in sigmoid and descending colon.  Pathology with tubular adenomas and rectal hyperplastic polyp.  Recommended 5 year surveillance.   COLONOSCOPY WITH PROPOFOL  N/A 11/08/2022   Procedure: COLONOSCOPY WITH PROPOFOL ;  Surgeon: Shaaron Lamar HERO, MD;  [facility];  Service: Endoscopy;  Laterality: N/A;  815am, asa 3   CYST EXCISION Left 07/19/2016   Procedure: CYST REMOVAL LEFT RING FINGER;  Surgeon: Taft FORBES Minerva, MD;  Location: [facility];  Service: Orthopedics;  Laterality: Left;   CYST REMOVAL LEG Right    foot   ESOPHAGOGASTRODUODENOSCOPY N/A 10/21/2014   RMR: Small hiatal hernia;  otherwise normal EGD status post passage of a Maloney dilator.    ESOPHAGOGASTRODUODENOSCOPY (EGD) WITH PROPOFOL  N/A 11/08/2022   Procedure: ESOPHAGOGASTRODUODENOSCOPY (EGD) WITH PROPOFOL ;  Surgeon: Shaaron Lamar HERO, MD;  [facility];  Service: Endoscopy;  Laterality: N/A;   EXCISION MASS UPPER EXTREMETIES Right 04/17/2018   Procedure: EXCISION MASS UPPER EXTREMETIES right ring finger;  Surgeon: Margrette Taft BRAVO, MD;  Location: [facility];  Service: Orthopedics;  Laterality: Right;   LUMBAR LAMINECTOMY/DECOMPRESSION MICRODISCECTOMY Bilateral 11/26/2022    Procedure: Microdiscectomy - bilateral - Lumbar four-Lumbar five;  Surgeon: Louis Shove, MD;  Location: [facility];  Service: Neurosurgery;  Laterality: Bilateral;   MALONEY DILATION N/A 10/21/2014   Procedure: AGAPITO DILATION;  Surgeon: Lamar HERO Shaaron, MD;  [facility];  Service: Endoscopy;  Laterality: N/A;   MALONEY DILATION N/A 11/08/2022   Procedure: AGAPITO DILATION;  Surgeon: Shaaron Lamar HERO, MD;  [facility];  Service: Endoscopy;  Laterality: N/A;   MIDDLE EAR SURGERY Right    patched hole in ear drum   POLYPECTOMY  09/23/2017   Procedure: POLYPECTOMY;  Surgeon: Shaaron Lamar HERO, MD;  [facility];  Service: Endoscopy;;  polyp hepatic flexure polyp cs, splenic flexure polyp cs, rectal polyp cs   POLYPECTOMY  11/08/2022   Procedure: POLYPECTOMY;  Surgeon: Shaaron Lamar HERO, MD;  [facility];  Service: Endoscopy;;   TOTAL ABDOMINAL HYSTERECTOMY     Social History   Socioeconomic History   Marital status: Divorced    Spouse name: Not on file   Number of children: 2   Years of education: Not on file   Highest education level: Not on file  Occupational History   Not on file  Tobacco Use   Smoking status: Never   Smokeless tobacco: Never  Vaping Use   Vaping status: Never Used  Substance and Sexual Activity   Alcohol use: No    Alcohol/week: 0.0 standard drinks of alcohol   Drug use: No   Sexual activity: Not Currently    Birth control/protection: Surgical    Comment: Hysterectomy  Other Topics Concern   Not on file  Social History Narrative   Lives with grandson (since born)      Enjoys: vacation, travel, concerts      Diet: eats all food groups    Caffeine: diet Pepsi daily    Water :  8 cups daily       Wears seat belt    Does use phone   Smoke Retail Buyer in safe area    Social Drivers of Health   Financial Resource Strain: Low Risk  (12/02/2019)   Overall Financial Resource Strain (CARDIA)    Difficulty of  Paying Living Expenses: Not hard at all  Food Insecurity: No Food Insecurity (12/02/2019)   Hunger Vital Sign    Worried About Running Out of Food in the Last Year: Never true    Ran Out of Food in the Last Year: Never true  Transportation Needs: No Transportation Needs (12/02/2019)   PRAPARE - Administrator, Civil Service (Medical): No    Lack of Transportation (Non-Medical): No  Physical Activity: Insufficiently Active (12/02/2019)   Exercise Vital Sign    Days of Exercise per Week: 2 days    Minutes of Exercise per Session: 20 min  Stress: Not on file  Social Connections: Moderately Isolated (12/02/2019)   Social Connection and Isolation Panel    Frequency of Communication with  Friends and Family: More than three times a week    Frequency of Social Gatherings with Friends and Family: More than three times a week    Attends Religious Services: More than 4 times per year    Active Member of [employer] or Organizations: No    Attends Banker Meetings: Never    Marital Status: Divorced   Outpatient Encounter Medications as of 04/02/2024  Medication Sig   Accu-Chek FastClix Lancets MISC TEST FOUR TIMES DAILY   ACCU-CHEK GUIDE TEST test strip USE TO TEST BLOOD GLUCOSE FOUR TIMES DAILY.   albuterol  (VENTOLIN  HFA) 108 (90 Base) MCG/ACT inhaler INHALE 4 PUFFS INTO THE LUNGS EVERY 6 HOURS AS NEEDED FOR WHEEZING OR SHORTNESS OF BREATH   aspirin EC 81 MG tablet Take 81 mg by mouth daily.   cetirizine  (ZYRTEC ) 10 MG tablet Take 1 tablet (10 mg total) by mouth daily.   Cholecalciferol  (VITAMIN D -3) 1000 UNITS CAPS Take 1,000 Units by mouth daily.    cyclobenzaprine  (FLEXERIL ) 10 MG tablet TAKE ONE TABLET (10MG  TOTAL) BY MOUTH THREE TIMES DAILY AS NEEDED FOR MUSCLE SPASMS   diclofenac  Sodium (VOLTAREN ) 1 % GEL APPLY 2 GRAMS TO AFFECTED AREAS 2 TIMES A DAY AS NEEDED   EPINEPHrine  0.3 mg/0.3 mL IJ SOAJ injection Inject 0.3 mg into the muscle as needed for anaphylaxis.    fluticasone  (FLONASE ) 50 MCG/ACT nasal spray Place 2 sprays into both nostrils daily as needed.   HYDROcodone -acetaminophen  (NORCO) 7.5-325 MG tablet Take 1 tablet by mouth 4 (four) times daily as needed.   ketoconazole  (NIZORAL ) 2 % cream APPLY TO AFFECTED AREAS TWICE DAILY   olmesartan  (BENICAR ) 40 MG tablet TAKE ONE TABLET (40MG  TOTAL) BY MOUTH DAILY   pantoprazole  (PROTONIX ) 40 MG tablet TAKE ONE TABLET (40MG  TOTAL) BY MOUTH DAILY   pregabalin  (LYRICA ) 100 MG capsule TAKE ONE CAPSULE (100MG  TOTAL) BY MOUTH TWO TIMES DAILY   Semaglutide ,0.25 or 0.5MG /DOS, (OZEMPIC , 0.25 OR 0.5 MG/DOSE,) 2 MG/3ML SOPN Inject 0.5 mg into the skin once a week.   simvastatin  (ZOCOR ) 10 MG tablet TAKE ONE TABLET (10MG  TOTAL) BY MOUTH DAILY AT 6PM   SURE COMFORT PEN NEEDLES 31G X 8 MM MISC USE FOUR TIMES DAILY   vitamin B-12 (CYANOCOBALAMIN ) 500 MCG tablet Take 500 mcg by mouth daily.   [DISCONTINUED] insulin  aspart (NOVOLOG  FLEXPEN) 100 UNIT/ML FlexPen Inject 20-26 Units into the skin 3 (three) times daily with meals.   [DISCONTINUED] LANTUS  SOLOSTAR 100 UNIT/ML Solostar Pen Inject 70 Units into the skin at bedtime.   [DISCONTINUED] levothyroxine  (SYNTHROID ) 75 MCG tablet TAKE ONE (1) TABLET BY MOUTH EVERY DAY BEFORE BREAKFAST (Patient taking differently: Take 75 mcg by mouth daily before breakfast. TAKE ONE (1) TABLET BY MOUTH EVERY DAY BEFORE BREAKFAST Patient states that she skips 2 days)   [DISCONTINUED] metFORMIN  (GLUCOPHAGE -XR) 500 MG 24 hr tablet Take 1 tablet (500 mg total) by mouth 2 (two) times daily with a meal.   [DISCONTINUED] Semaglutide , 1 MG/DOSE, (OZEMPIC , 1 MG/DOSE,) 4 MG/3ML SOPN Inject 1 mg into the skin once a week.   insulin  aspart (NOVOLOG  FLEXPEN) 100 UNIT/ML FlexPen Inject 20-26 Units into the skin 3 (three) times daily with meals.   LANTUS  SOLOSTAR 100 UNIT/ML Solostar Pen Inject 80 Units into the skin at bedtime.   levothyroxine  (SYNTHROID ) 75 MCG tablet TAKE ONE (1) TABLET BY MOUTH EVERY  DAY BEFORE BREAKFAST   metFORMIN  (GLUCOPHAGE -XR) 500 MG 24 hr tablet Take 1 tablet (500 mg total) by mouth 2 (two)  times daily with a meal.   [DISCONTINUED] amoxicillin -clavulanate (AUGMENTIN ) 875-125 MG tablet Take 1 tablet by mouth 2 (two) times daily. (Patient not taking: Reported on 04/02/2024)   No facility-administered encounter medications on file as of 04/02/2024.   ALLERGIES: Allergies  Allergen Reactions   Kiwi Extract Anaphylaxis, Swelling and Palpitations   Other Anaphylaxis    Walnuts (avoids all tree nuts)   Statins     Transaminase elevation   VACCINATION STATUS: Immunization History  Administered Date(s) Administered   Influenza,inj,Quad PF,6+ Mos 04/15/2017, 03/30/2019   Influenza-Unspecified 04/17/2011, 03/24/2012, 05/12/2013, 02/23/2015, 04/04/2017, 04/08/2018   Moderna Sars-Covid-2 Vaccination 10/22/2019, 11/19/2019   Tdap 09/18/2021   Zoster Recombinant(Shingrix ) 09/14/2021, 01/16/2022    Diabetes She presents for her follow-up diabetic visit. She has type 2 diabetes mellitus. Onset time: She was diagnosed at approximate age of 76 years. Her disease course has been worsening. There are no hypoglycemic associated symptoms. Pertinent negatives for hypoglycemia include no confusion, nervousness/anxiousness, pallor or seizures. Pertinent negatives for diabetes include no fatigue, no polydipsia, no polyphagia, no polyuria and no weight loss. There are no hypoglycemic complications. Symptoms are stable. There are no diabetic complications. Risk factors for coronary artery disease include dyslipidemia, diabetes mellitus, hypertension, sedentary lifestyle, obesity, post-menopausal and stress. Current diabetic treatment includes intensive insulin  program and oral agent (monotherapy) (and Ozempic ). She is compliant with treatment most of the time. Her weight is stable. She is following a generally healthy diet. When asked about meal planning, she reported none. She has had a  previous visit with a dietitian. She never participates in exercise. Her home blood glucose trend is increasing steadily. Her overall blood glucose range is 180-200 mg/dl. (She presents today with her meter showing fluctuating glycemic profile overall and gross hyperglycemia.  Her POCT A1c today is 9.6%, increasing from last visit of 8.8%. Analysis of her meter shows 7-day average of 207, 14-day average of 194, 30-day average of 205, 90-day average of 223.  She tried taking the 1 mg of Ozempic  but did not tolerate it.  ) An ACE inhibitor/angiotensin II receptor blocker is being taken. She sees a podiatrist.Eye exam is current.  Thyroid  Problem Presents for follow-up visit. Patient reports no anxiety, cold intolerance, constipation, depressed mood, diarrhea, dry skin, fatigue, hair loss, heat intolerance, weight gain or weight loss. The symptoms have been stable.     Review of systems  Constitutional: + increasing body weight- just got home from a cruise,  current Body mass index is 38.54 kg/m. , no fatigue, no subjective hyperthermia, no subjective hypothermia Eyes: no blurry vision, no xerophthalmia ENT: no sore throat, no nodules palpated in throat, no dysphagia/odynophagia, no hoarseness Cardiovascular: no chest pain, no shortness of breath, no palpitations, no leg swelling Respiratory: no cough, no shortness of breath Gastrointestinal: no nausea/vomiting/diarrhea Musculoskeletal: chronic back pain Skin: no rashes, no hyperemia Neurological: no tremors, no numbness, no tingling, no dizziness Psychiatric: no depression, no anxiety   Objective:    BP 130/76 (BP Location: Left Arm, Patient Position: Sitting, Cuff Size: Large)   Pulse 68   Ht 5' 5 (1.651 m)   Wt 231 lb 9.6 oz (105.1 kg)   BMI 38.54 kg/m   Wt Readings from Last 3 Encounters:  04/02/24 231 lb 9.6 oz (105.1 kg)  03/02/24 226 lb 0.6 oz (102.5 kg)  02/21/24 223 lb 6.4 oz (101.3 kg)    BP Readings from Last 3 Encounters:   04/02/24 130/76  03/02/24 124/67  02/21/24 118/68  Physical Exam- Limited  Constitutional:  Body mass index is 38.54 kg/m. , not in acute distress, normal state of mind Eyes:  EOMI, no exophthalmos Musculoskeletal: no gross deformities, strength intact in all four extremities, no gross restriction of joint movements Skin:  no rashes, no hyperemia Neurological: no tremor with outstretched hands   Diabetic Foot Exam - Simple   No data filed      Lipid Panel     Component Value Date/Time   CHOL 137 03/31/2024 1014   TRIG 63 03/31/2024 1014   HDL 55 03/31/2024 1014   CHOLHDL 2.5 03/31/2024 1014   CHOLHDL 2.4 12/25/2019 0709   VLDL 10 03/07/2016 0705   LDLCALC 69 03/31/2024 1014   LDLCALC 65 12/25/2019 0709    Recent Results (from the past 2160 hours)  TSH     Status: None   Collection Time: 03/31/24 10:14 AM  Result Value Ref Range   TSH 1.030 0.450 - 4.500 uIU/mL  T4, free     Status: None   Collection Time: 03/31/24 10:14 AM  Result Value Ref Range   Free T4 1.29 0.82 - 1.77 ng/dL  Comprehensive metabolic panel with GFR     Status: None   Collection Time: 03/31/24 10:14 AM  Result Value Ref Range   Glucose 97 70 - 99 mg/dL   BUN 13 8 - 27 mg/dL   Creatinine, Ser 9.15 0.57 - 1.00 mg/dL   eGFR 79 >40 fO/fpw/8.26   BUN/Creatinine Ratio 15 12 - 28   Sodium 144 134 - 144 mmol/L   Potassium 4.1 3.5 - 5.2 mmol/L   Chloride 105 96 - 106 mmol/L   CO2 23 20 - 29 mmol/L   Calcium 9.6 8.7 - 10.3 mg/dL   Total Protein 6.6 6.0 - 8.5 g/dL   Albumin 4.4 3.9 - 4.9 g/dL   Globulin, Total 2.2 1.5 - 4.5 g/dL   Bilirubin Total 0.2 0.0 - 1.2 mg/dL   Alkaline Phosphatase 79 49 - 135 IU/L   AST 26 0 - 40 IU/L   ALT 31 0 - 32 IU/L  VITAMIN D  25 Hydroxy (Vit-D Deficiency, Fractures)     Status: None   Collection Time: 03/31/24 10:14 AM  Result Value Ref Range   Vit D, 25-Hydroxy 36.4 30.0 - 100.0 ng/mL    Comment: Vitamin D  deficiency has been defined by the Institute  of Medicine and an Endocrine Society practice guideline as a level of serum 25-OH vitamin D  less than 20 ng/mL (1,2). The Endocrine Society went on to further define vitamin D  insufficiency as a level between 21 and 29 ng/mL (2). 1. IOM (Institute of Medicine). 2010. Dietary reference    intakes for calcium and D. [place]: The    [employer]. 2. Holick MF, Binkley Olmsted, Bischoff-Ferrari HA, et al.    Evaluation, treatment, and prevention of vitamin D     deficiency: an Endocrine Society clinical practice    guideline. JCEM. 2011 Jul; 96(7):1911-30.   Lipid panel     Status: None   Collection Time: 03/31/24 10:14 AM  Result Value Ref Range   Cholesterol, Total 137 100 - 199 mg/dL   Triglycerides 63 0 - 149 mg/dL   HDL 55 >60 mg/dL   VLDL Cholesterol Cal 13 5 - 40 mg/dL   LDL Chol Calc (NIH) 69 0 - 99 mg/dL   Chol/HDL Ratio 2.5 0.0 - 4.4 ratio    Comment:  T. Chol/HDL Ratio                                             Men  Women                               1/2 Avg.Risk  3.4    3.3                                   Avg.Risk  5.0    4.4                                2X Avg.Risk  9.6    7.1                                3X Avg.Risk 23.4   11.0   CMP14+EGFR     Status: None   Collection Time: 03/31/24 10:14 AM  Result Value Ref Range   Glucose 94 70 - 99 mg/dL   BUN 13 8 - 27 mg/dL   Creatinine, Ser 9.14 0.57 - 1.00 mg/dL   eGFR 77 >40 fO/fpw/8.26   BUN/Creatinine Ratio 15 12 - 28   Sodium 143 134 - 144 mmol/L   Potassium 4.1 3.5 - 5.2 mmol/L   Chloride 104 96 - 106 mmol/L   CO2 24 20 - 29 mmol/L   Calcium 9.8 8.7 - 10.3 mg/dL   Total Protein 6.7 6.0 - 8.5 g/dL   Albumin 4.3 3.9 - 4.9 g/dL   Globulin, Total 2.4 1.5 - 4.5 g/dL   Bilirubin Total 0.2 0.0 - 1.2 mg/dL   Alkaline Phosphatase 76 49 - 135 IU/L   AST 21 0 - 40 IU/L   ALT 28 0 - 32 IU/L  CBC with Differential/Platelet     Status: None   Collection Time: 03/31/24  10:14 AM  Result Value Ref Range   WBC 5.2 3.4 - 10.8 x10E3/uL   RBC 4.18 3.77 - 5.28 x10E6/uL   Hemoglobin 11.9 11.1 - 15.9 g/dL   Hematocrit 63.2 65.9 - 46.6 %   MCV 88 79 - 97 fL   MCH 28.5 26.6 - 33.0 pg   MCHC 32.4 31.5 - 35.7 g/dL   RDW 86.3 88.2 - 84.5 %   Platelets 250 150 - 450 x10E3/uL   Neutrophils 61 Not Estab. %   Lymphs 29 Not Estab. %   Monocytes 7 Not Estab. %   Eos 2 Not Estab. %   Basos 1 Not Estab. %   Neutrophils Absolute 3.3 1.4 - 7.0 x10E3/uL   Lymphocytes Absolute 1.5 0.7 - 3.1 x10E3/uL   Monocytes Absolute 0.3 0.1 - 0.9 x10E3/uL   EOS (ABSOLUTE) 0.1 0.0 - 0.4 x10E3/uL   Basophils Absolute 0.0 0.0 - 0.2 x10E3/uL   Immature Granulocytes 0 Not Estab. %   Immature Grans (Abs) 0.0 0.0 - 0.1 x10E3/uL  HgB A1c     Status: Abnormal   Collection Time: 04/02/24  9:22 AM  Result Value Ref Range   Hemoglobin A1C 9.6 (A) 4.0 - 5.6 %   HbA1c POC (<> result, manual entry)  HbA1c, POC (prediabetic range)     HbA1c, POC (controlled diabetic range)         Assessment & Plan:   1) Uncontrolled type 2 diabetes mellitus with complication, with long-term current use of insulin  (HCC)  She presents today with her meter showing fluctuating glycemic profile overall and gross hyperglycemia.  Her POCT A1c today is 9.6%, increasing from last visit of 8.8%. Analysis of her meter shows 7-day average of 207, 14-day average of 194, 30-day average of 205, 90-day average of 223.  She tried taking the 1 mg of Ozempic  but did not tolerate it.  She has had prednisone  last month for sinus infection, and had steroid injection in hip recently, likely contributing to her worsening glucose readings.  -Her diabetes is complicated by history of noncompliance (she recently showed better engagement) and patient remains at extremely high risk for more acute and chronic complications of diabetes which include CAD, CVA, CKD, retinopathy, and neuropathy. These are all discussed in detail with the  patient.  - Nutritional counseling repeated/built upon at each appointment.  - The patient admits there is a room for improvement in their diet and drink choices. -  Suggestion is made for the patient to avoid simple carbohydrates from their diet including Cakes, Sweet Desserts / Pastries, Ice Cream, Soda (diet and regular), Sweet Tea, Candies, Chips, Cookies, Sweet Pastries, Store Bought Juices, Alcohol in Excess of 1-2 drinks a day, Artificial Sweeteners, Coffee Creamer, and Sugar-free Products. This will help patient to have stable blood glucose profile and potentially avoid unintended weight gain.   - I encouraged the patient to switch to unprocessed or minimally processed complex starch and increased protein intake (animal or plant source), fruits, and vegetables.   - Patient is advised to stick to a routine mealtimes to eat 3 meals a day and avoid unnecessary snacks (to snack only to correct hypoglycemia).  - I have approached patient with the following individualized plan to manage diabetes and patient agrees.  -She will continue to require intensive treatment with  basal/bolus insulin  in order for her to achieve and maintain control of diabetes to target.  - She is advised to increase her Lantus  to 80 units SQ nightly, continue Novolog  20-26 units TID with meals if glucose is above 90 and she is eating (Specific instructions on how to titrate insulin  dosage based on glucose readings given to patient in writing), Metformin  500 mg ER twice daily with meals and stay at Ozempic  0.5mg  SQ weekly.   She does not tolerate higher doses.  -She is encouraged to continue monitoring blood glucose 4 times daily before meals and before bed, and to call the clinic if she has readings less than 70 or greater than 300 for 3 tests in a row.  Her insurance denied request for CGM due to fluctuating A1c, stating it was not helping her.  2) Lipids/HPL:  Her most recent lipid panel from 03/31/24 shows  controlled LDL of 69.  She was put back on Simvastatin  by her PCP.  Side effects and precautions discussed with her.    3) Hypertension:  Her blood pressure is controlled to target.  She is advised to continue medications as prescribed by PCP.  4) Hypothyroidism:  -Her previsit TFTs are consistent with appropriate hormone replacement.  She is advised to continue her current dose of Levothyroxine  75 mcg po daily before breakfast (skipping 2 days out of the week).     - We discussed about the correct intake of  her thyroid  hormone, on empty stomach at fasting, with water , separated by at least 30 minutes from breakfast and other medications,  and separated by more than 4 hours from calcium, iron, multivitamins, acid reflux medications (PPIs). -Patient is made aware of the fact that thyroid  hormone replacement is needed for life, dose to be adjusted by periodic monitoring of thyroid  function tests.    - I advised patient to maintain close follow up with Tobie Suzzane POUR, MD for primary care needs.     I spent  29  minutes in the care of the patient today including review of labs from CMP, Lipids, Thyroid  Function, Hematology (current and previous including abstractions from other facilities); face-to-face time discussing  her blood glucose readings/logs, discussing hypoglycemia and hyperglycemia episodes and symptoms, medications doses, her options of short and long term treatment based on the latest standards of care / guidelines;  discussion about incorporating lifestyle medicine;  and documenting the encounter. Risk reduction counseling performed per USPSTF guidelines to reduce obesity and cardiovascular risk factors.     Please refer to Patient Instructions for Blood Glucose Monitoring and Insulin /Medications Dosing Guide  in media tab for additional information. Please  also refer to  Patient Self Inventory in the Media  tab for reviewed elements of pertinent patient history.  Madelin LELON Colburn  participated in the discussions, expressed understanding, and voiced agreement with the above plans.  All questions were answered to her satisfaction. she is encouraged to contact clinic should she have any questions or concerns prior to her return visit.    Follow up plan: -Return in about 3 months (around 07/03/2024) for Diabetes F/U with A1c in office, No previsit labs, Bring meter and logs.  Benton Rio, [facility] [address] [place], [place] [address] Phone: [phone] Fax: [phone]   04/02/2024, 9:36 AM

## 2024-04-08 ENCOUNTER — Ambulatory Visit (INDEPENDENT_AMBULATORY_CARE_PROVIDER_SITE_OTHER): Admitting: Otolaryngology

## 2024-04-09 ENCOUNTER — Encounter (INDEPENDENT_AMBULATORY_CARE_PROVIDER_SITE_OTHER): Payer: Self-pay | Admitting: Otolaryngology

## 2024-04-09 ENCOUNTER — Ambulatory Visit (INDEPENDENT_AMBULATORY_CARE_PROVIDER_SITE_OTHER): Admitting: Otolaryngology

## 2024-04-09 VITALS — BP 112/70 | Temp 98.0°F

## 2024-04-09 DIAGNOSIS — H7011 Chronic mastoiditis, right ear: Secondary | ICD-10-CM | POA: Diagnosis not present

## 2024-04-09 DIAGNOSIS — J0101 Acute recurrent maxillary sinusitis: Secondary | ICD-10-CM | POA: Diagnosis not present

## 2024-04-09 DIAGNOSIS — M5416 Radiculopathy, lumbar region: Secondary | ICD-10-CM | POA: Diagnosis not present

## 2024-04-09 DIAGNOSIS — Z6836 Body mass index (BMI) 36.0-36.9, adult: Secondary | ICD-10-CM | POA: Diagnosis not present

## 2024-04-09 DIAGNOSIS — H90A31 Mixed conductive and sensorineural hearing loss, unilateral, right ear with restricted hearing on the contralateral side: Secondary | ICD-10-CM | POA: Diagnosis not present

## 2024-04-09 MED ORDER — AZITHROMYCIN 250 MG PO TABS: ORAL_TABLET | ORAL | 0 refills | Status: DC

## 2024-04-09 NOTE — Progress Notes (Signed)
 Patient ID: Sandy Valencia, female   DOB: [birth year], 64 y.o.   MRN: [id number]  Follow-up: Chronic right mastoiditis New complaint: Sinusitis  HPI: The patient is a 63 year old female who returns today for her follow-up evaluation.  The patient has a history of right chronic otomastoiditis.  She was treated with  right canal wall down tympanomastoidectomy surgery.  The patient returns today reporting no significant ear difficulty.  She denies any otalgia or otorrhea.  She has a history of bilateral high-frequency sensorineural hearing loss, with additional conductive hearing loss on the right side.  She has a new complaint today of sinusitis.  She has noted increasing facial pain and pressure, nasal drainage, and sore throat.  Exam: General: Communicates without difficulty, well nourished, no acute distress. Head: Normocephalic, no evidence injury, no tenderness, facial buttresses intact without stepoff. Face/sinus: No tenderness to palpation and percussion. Facial movement is normal and symmetric. Eyes: PERRL, EOMI. No scleral icterus, conjunctivae clear. Neuro: CN II exam reveals vision grossly intact.  No nystagmus at any point of gaze. Ears: Auricles well formed without lesions.  A right mastoid bowl is noted, with a small amount of squamous debris. No evidence of infection or cholesteatoma. Nose: External evaluation reveals normal support and skin without lesions.  Dorsum is intact.  Anterior rhinoscopy reveals erythematous and edematous mucosa over anterior aspect of inferior turbinates and intact septum.  No purulence noted. Oral:  Oral cavity and oropharynx are intact, symmetric, without erythema or edema.  Mucosa is moist without lesions. Neck: Full range of motion without pain.  There is no significant lymphadenopathy.  No masses palpable.  Thyroid  bed within normal limits to palpation.  Parotid glands and submandibular glands equal bilaterally without mass.  Trachea is midline. Neuro:  CN 2-12  grossly intact.     Assessment: 1.  Chronic right mastoiditis, status post right canal wall down tympanomastoidectomy surgery. 2.  No recurrent infection or cholesteatoma is noted today. 3.  Subjectively stable bilateral sensorineural hearing loss and right ear conductive hearing loss. 4.  Early acute sinusitis, with erythematous and edematous nasal mucosa.  Plan: 1.  The physical exam findings are reviewed with the patient. 2.  Continue dry ear precautions on the right side. 3.  Azithromycin  for 5 days. 4.  The patient will return for reevaluation in 6 months.

## 2024-04-13 ENCOUNTER — Other Ambulatory Visit (HOSPITAL_COMMUNITY): Payer: Self-pay | Admitting: Neurosurgery

## 2024-04-13 DIAGNOSIS — M5416 Radiculopathy, lumbar region: Secondary | ICD-10-CM

## 2024-04-16 ENCOUNTER — Ambulatory Visit (HOSPITAL_COMMUNITY)
Admission: RE | Admit: 2024-04-16 | Discharge: 2024-04-16 | Disposition: A | Discharge status: Home / Self Care | Source: Ambulatory Visit | Attending: Neurosurgery | Admitting: Neurosurgery

## 2024-04-16 DIAGNOSIS — M5416 Radiculopathy, lumbar region: Secondary | ICD-10-CM | POA: Diagnosis not present

## 2024-04-29 ENCOUNTER — Ambulatory Visit (INDEPENDENT_AMBULATORY_CARE_PROVIDER_SITE_OTHER): Admitting: Otolaryngology

## 2024-05-08 ENCOUNTER — Encounter (HOSPITAL_COMMUNITY): Payer: Self-pay

## 2024-05-08 ENCOUNTER — Ambulatory Visit (HOSPITAL_COMMUNITY)

## 2024-05-08 DIAGNOSIS — M7062 Trochanteric bursitis, left hip: Secondary | ICD-10-CM | POA: Insufficient documentation

## 2024-05-08 DIAGNOSIS — M6281 Muscle weakness (generalized): Secondary | ICD-10-CM | POA: Insufficient documentation

## 2024-05-08 DIAGNOSIS — M5459 Other low back pain: Secondary | ICD-10-CM | POA: Diagnosis present

## 2024-05-08 DIAGNOSIS — R262 Difficulty in walking, not elsewhere classified: Secondary | ICD-10-CM | POA: Insufficient documentation

## 2024-05-08 NOTE — Therapy (Signed)
 OUTPATIENT PHYSICAL THERAPY THORACOLUMBAR EVALUATION   Patient Name: Sandy Valencia MRN: [id number] DOB:[birth year], 63 y.o., female Today's Date: 05/08/2024  END OF SESSION:  PT End of Session - 05/08/24 1415     Visit Number 1    Number of Visits 8    Date for Recertification  07/03/24    Authorization Type [employer]    Authorization Time Period seeking authorization    PT Start Time 1415    PT Stop Time 1459    PT Time Calculation (min) 44 min    Activity Tolerance Patient tolerated treatment well    Behavior During Therapy [facility] for tasks assessed/performed          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   Anaphylactic shock, unspecified, subsequent encounter    Anemia, iron deficiency 02/14/2015   patient is not aware this dx   Angio-edema    Arthritis    Asthma    Mild intermittent asthma   Asymptomatic varicose veins of right lower extremity    Chronic obstructive pulmonary disease with (acute) exacerbation (HCC)    patient denies this dx   Chronic rhinitis 05/21/2018   COVID-19 2019   Diabetes mellitus    type 2   Essential (primary) hypertension    Family history of colon cancer 09/03/2012   Fatty liver    Gastro-esophageal reflux disease without esophagitis    GERD (gastroesophageal reflux disease)    Hearing loss    wears hearing aid   History of colonic polyps 08/26/2017   Hypercholesteremia    Hypertension    Hypothyroidism    Low back pain    and left leg pain   Mass of finger, right s/p surgical excision (mucoid cyst) 04/17/18 04/22/2018   Mucous cyst of digit of right hand    Obesity, unspecified    Other adverse food reactions, not elsewhere classified, initial encounter    Personal history of noncompliance with medical treatment, presenting hazards to health 05/18/2015   Rash and other nonspecific skin eruption    Restless legs syndrome     patient denies this dx   Sleep apnea    pt siad, i had small amount but could not tolerate CPAP and they said it was ok since it was mild.   Unspecified asthma, uncomplicated    Viral URI 07/13/2022   Zoster without complications    Past Surgical History:  Procedure Laterality Date   ABDOMINAL HYSTERECTOMY     BACK SURGERY  11/26/2022   BIOPSY  11/08/2022   Procedure: BIOPSY;  Surgeon: Shaaron Lamar HERO, MD;  [facility];  Service: Endoscopy;;   CATARACT EXTRACTION W/PHACO Left 03/09/2022   Procedure: CATARACT EXTRACTION PHACO AND INTRAOCULAR LENS PLACEMENT (IOC);  Surgeon: Harrie Agent, MD;  Location: [facility];  Service: Ophthalmology;  Laterality: Left;  CDE 7.89   CATARACT EXTRACTION W/PHACO Right 03/23/2022   Procedure: CATARACT EXTRACTION PHACO AND INTRAOCULAR LENS PLACEMENT (IOC);  Surgeon: Harrie Agent, MD;  Location: [facility];  Service: Ophthalmology;  Laterality: Right;  CDE: 2.63   CHOLECYSTECTOMY     COLONOSCOPY N/A 09/22/2012   RMR: colonic polyps -removed as described above. tubular adenoma, next TCS 09/2017   COLONOSCOPY WITH PROPOFOL  N/A 09/23/2017   Surgeon: Shaaron Lamar HERO, MD; internal hemorrhoids, three 5-6 mm polyps removed, diverticulosis in sigmoid and descending colon.  Pathology with tubular adenomas and rectal hyperplastic  polyp.  Recommended 5 year surveillance.   COLONOSCOPY WITH PROPOFOL  N/A 11/08/2022   Procedure: COLONOSCOPY WITH PROPOFOL ;  Surgeon: Shaaron Lamar HERO, MD;  [facility];  Service: Endoscopy;  Laterality: N/A;  815am, asa 3   CYST EXCISION Left 07/19/2016   Procedure: CYST REMOVAL LEFT RING FINGER;  Surgeon: Taft FORBES Minerva, MD;  Location: [facility];  Service: Orthopedics;  Laterality: Left;   CYST REMOVAL LEG Right    foot   ESOPHAGOGASTRODUODENOSCOPY N/A 10/21/2014   RMR: Small hiatal hernia; otherwise normal EGD status post passage of a Maloney dilator.    ESOPHAGOGASTRODUODENOSCOPY (EGD) WITH PROPOFOL  N/A 11/08/2022    Procedure: ESOPHAGOGASTRODUODENOSCOPY (EGD) WITH PROPOFOL ;  Surgeon: Shaaron Lamar HERO, MD;  [facility];  Service: Endoscopy;  Laterality: N/A;   EXCISION MASS UPPER EXTREMETIES Right 04/17/2018   Procedure: EXCISION MASS UPPER EXTREMETIES right ring finger;  Surgeon: Minerva Taft FORBES, MD;  Location: [facility];  Service: Orthopedics;  Laterality: Right;   LUMBAR LAMINECTOMY/DECOMPRESSION MICRODISCECTOMY Bilateral 11/26/2022   Procedure: Microdiscectomy - bilateral - Lumbar four-Lumbar five;  Surgeon: Louis Shove, MD;  Location: [facility];  Service: Neurosurgery;  Laterality: Bilateral;   MALONEY DILATION N/A 10/21/2014   Procedure: AGAPITO DILATION;  Surgeon: Lamar HERO Shaaron, MD;  [facility];  Service: Endoscopy;  Laterality: N/A;   MALONEY DILATION N/A 11/08/2022   Procedure: AGAPITO DILATION;  Surgeon: Shaaron Lamar HERO, MD;  [facility];  Service: Endoscopy;  Laterality: N/A;   MIDDLE EAR SURGERY Right    patched hole in ear drum   POLYPECTOMY  09/23/2017   Procedure: POLYPECTOMY;  Surgeon: Shaaron Lamar HERO, MD;  [facility];  Service: Endoscopy;;  polyp hepatic flexure polyp cs, splenic flexure polyp cs, rectal polyp cs   POLYPECTOMY  11/08/2022   Procedure: POLYPECTOMY;  Surgeon: Shaaron Lamar HERO, MD;  [facility];  Service: Endoscopy;;   TOTAL ABDOMINAL HYSTERECTOMY     Patient Active Problem List   Diagnosis Date Noted   Acute recurrent maxillary sinusitis 04/09/2024   Subacute cough 03/02/2024   Sensorineural hearing loss, bilateral 10/31/2023   Primary osteoarthritis of right knee 10/17/2023   Acute cystitis without hematuria 06/19/2023   Acute bronchitis 05/24/2023   Chronic mastoiditis of right side 03/24/2023   Varicose veins of right lower extremity 01/22/2023   Acute pain of left lower extremity 10/25/2022   Primary insomnia 09/18/2022   Encounter for examination following treatment at hospital 09/18/2022   Acute recurrent  frontal sinusitis 07/20/2022   Lumbar radiculopathy 03/08/2022   Statin intolerance 01/19/2022   Encounter for general adult medical examination with abnormal findings 01/16/2022   Lumbar spondylosis 09/05/2021   Allergic rhinitis due to allergen 04/17/2021   Seborrheic keratosis 04/17/2021   Primary osteoarthritis involving multiple joints 12/02/2019   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   Long-term current use of opiate analgesic 09/08/2019   Neck pain 09/07/2019   Mild intermittent asthma without complication 05/21/2018   Gastroesophageal reflux disease 05/21/2018   History of colonic polyps 08/26/2017   Hypothyroidism 04/22/2017   Mixed hyperlipidemia 12/31/2016   Type 2 diabetes mellitus with hyperglycemia (HCC) 05/18/2015   Essential hypertension 05/18/2015   Morbid obesity (HCC) 05/18/2015   Fatty liver 02/14/2015   Hiatal hernia    Dysphagia 10/11/2014    PCP: Tobie Suzzane POUR, MD  REFERRING PROVIDER: Louis Shove, MD   REFERRING DIAG: Radiculopathy, lumbar region   Rationale for Evaluation and  Treatment: Rehabilitation  THERAPY DIAG:  Other low back pain  Muscle weakness (generalized)  ONSET DATE: 02/26/24  SUBJECTIVE:                                                                                                                                                                                           SUBJECTIVE STATEMENT: Patient reports that she began having back and left leg pain that began on 02/26/24 after getting back from a cruise. She has tried pain medication, but this doesn't seem to help. She had pain like this year after getting back from a trip. Her pain has been slowly letting up, but it has not gone away yet.   PERTINENT HISTORY:  Hypertension, OA, history of COVID-19, asthma, type 2 diabetes, and hearing deficits  PAIN:  Are you having pain? Yes: NPRS scale: Current: 0/10 Best: 0/10 Worst: 8/10   Pain location: low back and  left leg  Pain description: intermittent, sore, aching pain  Aggravating factors: walking Relieving factors: leaning on carts or tables  PRECAUTIONS: None  RED FLAGS: None   WEIGHT BEARING RESTRICTIONS: No  FALLS:  Has patient fallen in last 6 months? 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  DIAGNOSTIC FINDINGS: 04/16/24 lumbar MRI  IMPRESSION: Relatively stable degenerative spondylosis with mild dextroscoliotic curvature.   There is multilevel disc desiccation facet arthrosis most notably at L3-4 and L4-5 as above. There is relatively stable moderate to severe left foraminal narrowing at L3-4 secondary to left foraminal disc extrusion. Correlation for left L3 radiculopathy. There is caudal foraminal narrowing bilaterally at L4-5 secondary to disc osteophyte. Correlation for L4 radiculopathy.   No significant acute interval change. No spinal stenosis is identified.  PATIENT SURVEYS:  Modified Oswestry:  MODIFIED OSWESTRY DISABILITY SCALE  Date: 05/08/24 Score  Pain intensity 4 =  Pain medication provides me with little relief from pain.  2. Personal care (washing, dressing, etc.) 0 =  I can take care of myself normally without causing increased pain.  3. Lifting 4 = I can lift only very light weights  4. Walking 2 =  Pain prevents me from walking more than  mile.  5. Sitting 3 =  Pain prevents me from sitting more than  hour.  6. Standing 4 =  Pain prevents me from standing more than 10 minutes.  7. Sleeping 0 = Pain does not prevent me from sleeping well.  8. Social Life 1 =   My social life is normal, but it increases my level of pain.  9. Traveling 4 = My pain restricts my travel to short necessary journeys under 1/2 hour.  10. Employment/ Homemaking 2 = I can perform most of my homemaking/job duties, but pain prevents me from performing more physically stressful activities (eg, lifting, vacuuming).  Total 24/50   Interpretation of scores: Score Category Description  0-20% Minimal Disability The patient can cope with most living activities. Usually no treatment is indicated apart from advice on lifting, sitting and exercise  21-40% Moderate Disability The patient experiences more pain and difficulty with sitting, lifting and standing. Travel and social life are more difficult and they may be disabled from work. Personal care, sexual activity and sleeping are not grossly affected, and the patient can usually be managed by conservative means  41-60% Severe Disability Pain remains the main problem in this group, but activities of daily living are affected. These patients require a detailed investigation  61-80% Crippled Back pain impinges on all aspects of the patient's life. Positive intervention is required  81-100% Bed-bound These patients are either bed-bound or exaggerating their symptoms  Bluford FORBES Zoe DELENA Karon DELENA, et al. Surgery versus conservative management of stable thoracolumbar fracture: the PRESTO feasibility RCT. [place] ([place]): [employer]; 2021 Nov. [facility] Technology Assessment, No. 25.62.) Appendix 3, Oswestry Disability Index category descriptors. Available from: Findjewelers.cz  Minimally Clinically Important Difference (MCID) = 12.8%  COGNITION: Overall cognitive status: Within functional limits for tasks assessed     SENSATION: Patient reports no numbness or tingling  PALPATION: Familiar pain reproduced with palpation to left QL, TFL, and IT band   JOINT MOBILITY:  L1-5: mild  hypomobility  LUMBAR ROM:   AROM eval  Flexion WFL  Extension 25% limited  Right lateral flexion WFL; I feel it  Left lateral flexion WFL; I feel it  Right rotation 25% limited  Left rotation 25% limited    (Blank rows = not tested)  LOWER EXTREMITY ROM: WFL for activities assessed  LOWER EXTREMITY MMT:    MMT Right eval Left eval  Hip flexion 4-/5 4-/5  Hip extension    Hip abduction    Hip adduction 5/5 5/5  Hip internal rotation    Hip external rotation    Knee flexion 4/5 4/5; familiar pain  Knee extension 5/5 4/5; I feel it   Ankle dorsiflexion 4+/5 4+/5  Ankle plantarflexion    Ankle inversion    Ankle eversion     (Blank rows = not tested)  FUNCTIONAL TESTS:  5 times sit to stand: 14.36 seconds 2 minute walk test: 497 feet  GAIT: Distance walked: 497 feet Assistive device utilized: None Level of assistance: Complete Independence Comments: No significant gait deviations observed  TREATMENT DATE:  2024/05/26: PT evaluation, patient education, and HEP   PATIENT EDUCATION:  Education details: Plan of care, prognosis, healing, anatomy, referred pain, objective findings, HEP, and goals for physical therapy Person educated: Patient Education method: Explanation, Demonstration, and Handouts Education comprehension: verbalized understanding and returned demonstration  HOME EXERCISE PROGRAM: Access Code: [email or web address] URL: [email or web address] Date: May 26, 2024 Prepared by: Lacinda Fass  Exercises - Seated Flexion Stretch with Swiss Ball  - 1 x daily - 7 x weekly - 3 sets - 10 reps - Seated Abdominal Press into [employer]  - 1 x daily - 7 x weekly - 3 sets - 10 reps - 5 second hold  ASSESSMENT:  CLINICAL IMPRESSION: Patient is a 63 y.o. female who was seen today for physical therapy evaluation and treatment for left  low back and lower extremity pain.  She presented with low pain severity and irritability with palpation and left hamstring manual muscle testing reproducing her familiar symptoms.  Recommend that she continue with skilled physical therapy to address her impairments to return to her prior level of function.  OBJECTIVE IMPAIRMENTS: decreased activity tolerance, decreased knowledge of condition, decreased mobility, decreased ROM, decreased strength, hypomobility, impaired tone, and pain.   ACTIVITY LIMITATIONS: lifting and locomotion level  PARTICIPATION LIMITATIONS: shopping and community activity  PERSONAL FACTORS: Past/current experiences, Time since onset of injury/illness/exacerbation, and 3+ comorbidities: Hypertension, OA, history of COVID-19, asthma, type 2 diabetes, and hearing deficits are also affecting patient's functional outcome.   REHAB POTENTIAL: Good  CLINICAL DECISION MAKING: Stable/uncomplicated  EVALUATION COMPLEXITY: Low   GOALS: Goals reviewed with patient? Yes  LONG TERM GOALS: Target date: 06/05/24  Patient will be independent with her HEP. Baseline:  Goal status: INITIAL  2.  Patient will be able to complete her daily activities without her familiar symptoms exceeding 5/10. Baseline:  Goal status: INITIAL  3.  Patient will improve her left quadricep strength to at least 4+/5 for improved function with transfers. Baseline:  Goal status: INITIAL  4.  Patient will improve her 5 times sit to stand time to 12 seconds or less to reduce her risk of falling. Baseline:  Goal status: INITIAL  5.  Patient will improve her ODI score to 14/50 or less for improved perceived function with her daily activities. Baseline:  Goal status: INITIAL  PLAN:  PT FREQUENCY: 2x/week  PT DURATION: 4 weeks  PLANNED INTERVENTIONS: 97164- PT Re-evaluation, 97750- Physical Performance Testing, 97110-Therapeutic exercises, 97530- Therapeutic activity, 97112- Neuromuscular  re-education, 97535- Self Care, [id number]- Manual therapy, Patient/Family education, Balance training, Stair training, Joint mobilization, Spinal manipulation, Spinal mobilization, Cryotherapy, and Moist heat.  PLAN FOR NEXT SESSION: Review and update HEP as needed, manual therapy, lumbar stabilization, and lifting mechanics   Lacinda JAYSON Fass, PT 05-26-24, 5:54 PM    Managed Medicaid Authorization Request Treatment Start Date: 05-26-24  Visit Dx Codes: M54.59, M62.81   Functional Tool Score: ODI: 24/50  For all possible CPT codes, reference the Planned Interventions line above.     Check all conditions that are expected to impact treatment: {Conditions expected to impact treatment:Diabetes mellitus   If treatment provided at initial evaluation, no treatment charged due to lack of authorization.

## 2024-05-12 ENCOUNTER — Ambulatory Visit (HOSPITAL_COMMUNITY): Admitting: Physical Therapy

## 2024-05-14 ENCOUNTER — Ambulatory Visit (HOSPITAL_COMMUNITY)

## 2024-05-14 ENCOUNTER — Encounter (HOSPITAL_COMMUNITY): Payer: Self-pay

## 2024-05-14 DIAGNOSIS — M5416 Radiculopathy, lumbar region: Secondary | ICD-10-CM | POA: Diagnosis not present

## 2024-05-26 ENCOUNTER — Encounter (HOSPITAL_COMMUNITY): Payer: Self-pay

## 2024-05-26 ENCOUNTER — Ambulatory Visit: Admitting: Podiatry

## 2024-05-26 ENCOUNTER — Ambulatory Visit (HOSPITAL_COMMUNITY)

## 2024-05-26 DIAGNOSIS — M5459 Other low back pain: Secondary | ICD-10-CM

## 2024-05-26 DIAGNOSIS — M6281 Muscle weakness (generalized): Secondary | ICD-10-CM

## 2024-05-26 NOTE — Therapy (Signed)
 " OUTPATIENT PHYSICAL THERAPY THORACOLUMBAR TREATMENT    Patient Name: Sandy Valencia MRN: [id number] DOB:10-30-1960, 63 y.o., female Today's Date: 05/26/2024  END OF SESSION:  PT End of Session - 05/26/24 0918     Visit Number 2    Number of Visits 8    Date for Recertification  07/03/24    Authorization Type [employer]    Authorization Time Period 05/08/24-07/06/24    Authorization - Visit Number 1    Authorization - Number of Visits 6    PT Start Time 0918    PT Stop Time 0958    PT Time Calculation (min) 40 min    Activity Tolerance Patient tolerated treatment well    Behavior During Therapy [facility] for tasks assessed/performed           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   Anaphylactic shock, unspecified, subsequent encounter    Anemia, iron deficiency 02/14/2015   patient is not aware this dx   Angio-edema    Arthritis    Asthma    Mild intermittent asthma   Asymptomatic varicose veins of right lower extremity    Chronic obstructive pulmonary disease with (acute) exacerbation (HCC)    patient denies this dx   Chronic rhinitis 05/21/2018   COVID-19 2019   Diabetes mellitus    type 2   Essential (primary) hypertension    Family history of colon cancer 09/03/2012   Fatty liver    Gastro-esophageal reflux disease without esophagitis    GERD (gastroesophageal reflux disease)    Hearing loss    wears hearing aid   History of colonic polyps 08/26/2017   Hypercholesteremia    Hypertension    Hypothyroidism    Low back pain    and left leg pain   Mass of finger, right s/p surgical excision (mucoid cyst) 04/17/18 04/22/2018   Mucous cyst of digit of right hand    Obesity, unspecified    Other adverse food reactions, not elsewhere classified, initial encounter    Personal history of noncompliance with medical treatment, presenting hazards to health 05/18/2015   Rash  and other nonspecific skin eruption    Restless legs syndrome    patient denies this dx   Sleep apnea    pt siad, i had small amount but could not tolerate CPAP and they said it was ok since it was mild.   Unspecified asthma, uncomplicated    Viral URI 07/13/2022   Zoster without complications    Past Surgical History:  Procedure Laterality Date   ABDOMINAL HYSTERECTOMY     BACK SURGERY  11/26/2022   BIOPSY  11/08/2022   Procedure: BIOPSY;  Surgeon: Shaaron Lamar HERO, MD;  [facility];  Service: Endoscopy;;   CATARACT EXTRACTION W/PHACO Left 03/09/2022   Procedure: CATARACT EXTRACTION PHACO AND INTRAOCULAR LENS PLACEMENT (IOC);  Surgeon: Harrie Agent, MD;  Location: [facility];  Service: Ophthalmology;  Laterality: Left;  CDE 7.89   CATARACT EXTRACTION W/PHACO Right 03/23/2022   Procedure: CATARACT EXTRACTION PHACO AND INTRAOCULAR LENS PLACEMENT (IOC);  Surgeon: Harrie Agent, MD;  Location: [facility];  Service: Ophthalmology;  Laterality: Right;  CDE: 2.63   CHOLECYSTECTOMY     COLONOSCOPY N/A 09/22/2012   RMR: colonic polyps -removed as described above. tubular adenoma, next TCS 09/2017   COLONOSCOPY WITH PROPOFOL  N/A 09/23/2017   Surgeon: Shaaron Lamar HERO, MD; internal hemorrhoids,  three 5-6 mm polyps removed, diverticulosis in sigmoid and descending colon.  Pathology with tubular adenomas and rectal hyperplastic polyp.  Recommended 5 year surveillance.   COLONOSCOPY WITH PROPOFOL  N/A 11/08/2022   Procedure: COLONOSCOPY WITH PROPOFOL ;  Surgeon: Shaaron Lamar HERO, MD;  [facility];  Service: Endoscopy;  Laterality: N/A;  815am, asa 3   CYST EXCISION Left 07/19/2016   Procedure: CYST REMOVAL LEFT RING FINGER;  Surgeon: Taft FORBES Minerva, MD;  Location: [facility];  Service: Orthopedics;  Laterality: Left;   CYST REMOVAL LEG Right    foot   ESOPHAGOGASTRODUODENOSCOPY N/A 10/21/2014   RMR: Small hiatal hernia; otherwise normal EGD status post passage of a Maloney dilator.     ESOPHAGOGASTRODUODENOSCOPY (EGD) WITH PROPOFOL  N/A 11/08/2022   Procedure: ESOPHAGOGASTRODUODENOSCOPY (EGD) WITH PROPOFOL ;  Surgeon: Shaaron Lamar HERO, MD;  [facility];  Service: Endoscopy;  Laterality: N/A;   EXCISION MASS UPPER EXTREMETIES Right 04/17/2018   Procedure: EXCISION MASS UPPER EXTREMETIES right ring finger;  Surgeon: Minerva Taft FORBES, MD;  Location: [facility];  Service: Orthopedics;  Laterality: Right;   LUMBAR LAMINECTOMY/DECOMPRESSION MICRODISCECTOMY Bilateral 11/26/2022   Procedure: Microdiscectomy - bilateral - Lumbar four-Lumbar five;  Surgeon: Louis Shove, MD;  [facility];  Service: Neurosurgery;  Laterality: Bilateral;   MALONEY DILATION N/A 10/21/2014   Procedure: AGAPITO DILATION;  Surgeon: Lamar HERO Shaaron, MD;  [facility];  Service: Endoscopy;  Laterality: N/A;   MALONEY DILATION N/A 11/08/2022   Procedure: AGAPITO DILATION;  Surgeon: Shaaron Lamar HERO, MD;  [facility];  Service: Endoscopy;  Laterality: N/A;   MIDDLE EAR SURGERY Right    patched hole in ear drum   POLYPECTOMY  09/23/2017   Procedure: POLYPECTOMY;  Surgeon: Shaaron Lamar HERO, MD;  [facility];  Service: Endoscopy;;  polyp hepatic flexure polyp cs, splenic flexure polyp cs, rectal polyp cs   POLYPECTOMY  11/08/2022   Procedure: POLYPECTOMY;  Surgeon: Shaaron Lamar HERO, MD;  [facility];  Service: Endoscopy;;   TOTAL ABDOMINAL HYSTERECTOMY     Patient Active Problem List   Diagnosis Date Noted   Acute recurrent maxillary sinusitis 04/09/2024   Subacute cough 03/02/2024   Sensorineural hearing loss, bilateral 10/31/2023   Primary osteoarthritis of right knee 10/17/2023   Acute cystitis without hematuria 06/19/2023   Acute bronchitis 05/24/2023   Chronic mastoiditis of right side 03/24/2023   Varicose veins of right lower extremity 01/22/2023   Acute pain of left lower extremity 10/25/2022   Primary insomnia 09/18/2022   Encounter for  examination following treatment at hospital 09/18/2022   Acute recurrent frontal sinusitis 07/20/2022   Lumbar radiculopathy 03/08/2022   Statin intolerance 01/19/2022   Encounter for general adult medical examination with abnormal findings 01/16/2022   Lumbar spondylosis 09/05/2021   Allergic rhinitis due to allergen 04/17/2021   Seborrheic keratosis 04/17/2021   Primary osteoarthritis involving multiple joints 12/02/2019   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   Long-term current use of opiate analgesic 09/08/2019   Neck pain 09/07/2019   Mild intermittent asthma without complication 05/21/2018   Gastroesophageal reflux disease 05/21/2018   History of colonic polyps 08/26/2017   Hypothyroidism 04/22/2017   Mixed hyperlipidemia 12/31/2016   Type 2 diabetes mellitus with hyperglycemia (HCC) 05/18/2015   Essential hypertension 05/18/2015   Morbid obesity (HCC) 05/18/2015   Fatty liver 02/14/2015   Hiatal hernia    Dysphagia 10/11/2014    PCP: Tobie Suzzane POUR, MD  REFERRING PROVIDER: Louis Shove, MD   REFERRING DIAG: Radiculopathy, lumbar region   Rationale for Evaluation and Treatment: Rehabilitation  THERAPY DIAG:  Other low back pain  Muscle weakness (generalized)  ONSET DATE: 02/26/24  SUBJECTIVE:                                                                                                                                                                                           SUBJECTIVE STATEMENT: Patient reports that she feels good today. She notes that she does have some soreness when she tries to stand up after sitting for long periods.   Eval: Patient reports that she began having back and left leg pain that began on 02/26/24 after getting back from a cruise. She has tried pain medication, but this doesn't seem to help. She had pain like this year after getting back from a trip. Her pain has been slowly letting up, but it has not gone away  yet.   PERTINENT HISTORY:  Hypertension, OA, history of COVID-19, asthma, type 2 diabetes, and hearing deficits  PAIN:  Are you having pain? Yes: NPRS scale: Current: 0/10 Best: 0/10 Worst: 8/10   Pain location: low back and left leg  Pain description: intermittent, sore, aching pain  Aggravating factors: walking Relieving factors: leaning on carts or tables  PRECAUTIONS: None  RED FLAGS: None   WEIGHT BEARING RESTRICTIONS: No  FALLS:  Has patient fallen in last 6 months? 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  DIAGNOSTIC FINDINGS: 04/16/24 lumbar MRI  IMPRESSION: Relatively stable degenerative spondylosis with mild dextroscoliotic curvature.   There is multilevel disc desiccation facet arthrosis most notably at L3-4 and L4-5 as above. There is relatively stable moderate to severe left foraminal narrowing at L3-4 secondary to left foraminal disc extrusion. Correlation for left L3 radiculopathy. There is caudal foraminal narrowing bilaterally at L4-5 secondary to disc osteophyte. Correlation for L4 radiculopathy.   No significant acute interval change. No spinal stenosis is identified.  PATIENT SURVEYS:  Modified Oswestry:  MODIFIED OSWESTRY DISABILITY SCALE  Date: 05/08/24 Score  Pain intensity 4 =  Pain medication provides me with little relief from pain.  2. Personal care (washing, dressing, etc.) 0 =  I can take care of myself normally without causing increased pain.  3. Lifting 4 = I can lift only very light weights  4. Walking 2 =  Pain prevents me from walking more than   mile.  5. Sitting 3 =  Pain prevents me from sitting more than  hour.  6. Standing 4 =  Pain prevents me from standing more than 10 minutes.  7. Sleeping 0 = Pain does not prevent me from sleeping well.  8. Social Life 1 =  My social life is normal, but it increases my level of pain.  9. Traveling 4 = My pain restricts my travel to short necessary journeys under 1/2 hour.  10. Employment/ Homemaking 2 = I can perform most of my homemaking/job duties, but pain prevents me from performing more physically stressful activities (eg, lifting, vacuuming).  Total 24/50   Interpretation of scores: Score Category Description  0-20% Minimal Disability The patient can cope with most living activities. Usually no treatment is indicated apart from advice on lifting, sitting and exercise  21-40% Moderate Disability The patient experiences more pain and difficulty with sitting, lifting and standing. Travel and social life are more difficult and they may be disabled from work. Personal care, sexual activity and sleeping are not grossly affected, and the patient can usually be managed by conservative means  41-60% Severe Disability Pain remains the main problem in this group, but activities of daily living are affected. These patients require a detailed investigation  61-80% Crippled Back pain impinges on all aspects of the patients life. Positive intervention is required  81-100% Bed-bound These patients are either bed-bound or exaggerating their symptoms  Bluford FORBES Zoe DELENA Karon DELENA, et al. Surgery versus conservative management of stable thoracolumbar fracture: the PRESTO feasibility RCT. [place] ([place]): [employer]; 2021 Nov. [facility] Technology Assessment, No. 25.62.) Appendix 3, Oswestry Disability Index category descriptors. Available from: Findjewelers.cz  Minimally Clinically Important Difference (MCID) = 12.8%  COGNITION: Overall cognitive status: Within  functional limits for tasks assessed     SENSATION: Patient reports no numbness or tingling  PALPATION: Familiar pain reproduced with palpation to left QL, TFL, and IT band   JOINT MOBILITY:  L1-5: mild hypomobility  LUMBAR ROM:   AROM eval  Flexion WFL  Extension 25% limited  Right lateral flexion WFL; I feel it  Left lateral flexion WFL; I feel it  Right rotation 25% limited  Left rotation 25% limited    (Blank rows = not tested)  LOWER EXTREMITY ROM: WFL for activities assessed  LOWER EXTREMITY MMT:    MMT Right eval Left eval  Hip flexion 4-/5 4-/5  Hip extension    Hip abduction    Hip adduction 5/5 5/5  Hip internal rotation    Hip external rotation    Knee flexion 4/5 4/5; familiar pain  Knee extension 5/5 4/5; I feel it   Ankle dorsiflexion 4+/5 4+/5  Ankle plantarflexion    Ankle inversion    Ankle eversion     (Blank rows = not tested)  FUNCTIONAL TESTS:  5 times sit to stand: 14.36 seconds 2 minute walk test: 497 feet  GAIT: Distance walked: 497 feet Assistive device utilized: None Level of assistance: Complete Independence Comments: No significant gait deviations observed  TREATMENT DATE:  05/26/24 EXERCISE LOG  Exercise Repetitions and Resistance Comments  Nustep  L4 x 6 minutes    Isometric ball press  15 reps w/ 5 second    Standing ball roll out  10 reps each  Multidirectional   Rocker board   2 minutes  AP; BUE support from parallel bars   Pallof press   GTB x 20 reps each    Lateral step up 6 step x 20 reps each    Resisted pull down  GTB x 20 reps    Resisted punch out  GTB x 20 reps each    Sit to stand  20 reps  With tidal tank at chest    Blank cell = exercise not performed today   05/08/24: PT evaluation, patient education, and HEP   PATIENT EDUCATION:   Education details: HEP, POC, and expectations for soreness Person educated: Patient Education method: Explanation Education comprehension: verbalized understanding  HOME EXERCISE PROGRAM: Access Code: [email or web address] URL: [email or web address] Date: 05/08/2024 Prepared by: Lacinda Fass  Exercises - Seated Flexion Stretch with Swiss Ball  - 1 x daily - 7 x weekly - 3 sets - 10 reps - Seated Abdominal Press into [employer]  - 1 x daily - 7 x weekly - 3 sets - 10 reps - 5 second hold  ASSESSMENT:  CLINICAL IMPRESSION: Patient was introduced to multiple new interventions for improved lumbar mobility and stability. She required minimal cueing with pallof presses for upright stance to promote lumbar stability. She experienced no increase in pain or discomfort with any of today's interventions. She reported feeling good upon the conclusion of treatment. Patient continues to require skilled physical therapy to address her remaining impairments to return to her prior level of function.     Eval: Patient is a 63 y.o. female who was seen today for physical therapy evaluation and treatment for left low back and lower extremity pain.  She presented with low pain severity and irritability with palpation and left hamstring manual muscle testing reproducing her familiar symptoms.  Recommend that she continue with skilled physical therapy to address her impairments to return to her prior level of function.  OBJECTIVE IMPAIRMENTS: decreased activity tolerance, decreased knowledge of condition, decreased mobility, decreased ROM, decreased strength, hypomobility, impaired tone, and pain.   ACTIVITY LIMITATIONS: lifting and locomotion level  PARTICIPATION LIMITATIONS: shopping and community activity  PERSONAL FACTORS: Past/current experiences, Time since onset of injury/illness/exacerbation, and 3+ comorbidities: Hypertension, OA, history of COVID-19, asthma, type 2 diabetes, and hearing deficits  are also affecting patient's functional outcome.   REHAB POTENTIAL: Good  CLINICAL DECISION MAKING: Stable/uncomplicated  EVALUATION COMPLEXITY: Low   GOALS: Goals reviewed with patient? Yes  LONG TERM GOALS: Target date: 06/05/24  Patient will be independent with her HEP. Baseline:  Goal status: INITIAL  2.  Patient will be able to complete her daily activities without her familiar symptoms exceeding 5/10. Baseline:  Goal status: INITIAL  3.  Patient will improve her left quadricep strength to at least 4+/5 for improved function with transfers. Baseline:  Goal status: INITIAL  4.  Patient will improve her 5 times sit to stand time to 12 seconds or less to reduce her risk of falling. Baseline:  Goal status: INITIAL  5.  Patient will improve her ODI score to 14/50 or less for improved perceived function with her daily activities. Baseline:  Goal status: INITIAL  PLAN:  PT FREQUENCY: 2x/week  PT DURATION: 4 weeks  PLANNED INTERVENTIONS: 02835- PT Re-evaluation, 97750-  Physical Performance Testing, 97110-Therapeutic exercises, 97530- Therapeutic activity, [id number]- Neuromuscular re-education, [phone]- Self Care, 02859- Manual therapy, Patient/Family education, Balance training, Stair training, Joint mobilization, Spinal manipulation, Spinal mobilization, Cryotherapy, and Moist heat.  PLAN FOR NEXT SESSION: Review and update HEP as needed, manual therapy, lumbar stabilization, and lifting mechanics   Lacinda JAYSON Fass, PT 05/26/2024, 10:27 AM     "

## 2024-05-27 ENCOUNTER — Encounter (HOSPITAL_COMMUNITY): Payer: Self-pay

## 2024-05-27 ENCOUNTER — Ambulatory Visit (HOSPITAL_COMMUNITY)

## 2024-05-27 DIAGNOSIS — M5459 Other low back pain: Secondary | ICD-10-CM

## 2024-05-27 DIAGNOSIS — M7062 Trochanteric bursitis, left hip: Secondary | ICD-10-CM

## 2024-05-27 DIAGNOSIS — R262 Difficulty in walking, not elsewhere classified: Secondary | ICD-10-CM

## 2024-05-27 DIAGNOSIS — M6281 Muscle weakness (generalized): Secondary | ICD-10-CM

## 2024-05-27 NOTE — Therapy (Signed)
 " OUTPATIENT PHYSICAL THERAPY THORACOLUMBAR TREATMENT    Patient Name: Sandy Valencia MRN: [id number] DOB:Sep 12, 1960, 63 y.o., female Today's Date: 05/27/2024  END OF SESSION:  PT End of Session - 05/27/24 0949     Visit Number 3    Number of Visits 8    Date for Recertification  07/03/24    Authorization Type [employer]    Authorization Time Period 05/08/24-07/06/24    Authorization - Visit Number 2    Authorization - Number of Visits 6    PT Start Time [phone]    PT Stop Time 1025    PT Time Calculation (min) 38 min    Activity Tolerance Patient tolerated treatment well    Behavior During Therapy [facility] for tasks assessed/performed            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   Anaphylactic shock, unspecified, subsequent encounter    Anemia, iron deficiency 02/14/2015   patient is not aware this dx   Angio-edema    Arthritis    Asthma    Mild intermittent asthma   Asymptomatic varicose veins of right lower extremity    Chronic obstructive pulmonary disease with (acute) exacerbation (HCC)    patient denies this dx   Chronic rhinitis 05/21/2018   COVID-19 2019   Diabetes mellitus    type 2   Essential (primary) hypertension    Family history of colon cancer 09/03/2012   Fatty liver    Gastro-esophageal reflux disease without esophagitis    GERD (gastroesophageal reflux disease)    Hearing loss    wears hearing aid   History of colonic polyps 08/26/2017   Hypercholesteremia    Hypertension    Hypothyroidism    Low back pain    and left leg pain   Mass of finger, right s/p surgical excision (mucoid cyst) 04/17/18 04/22/2018   Mucous cyst of digit of right hand    Obesity, unspecified    Other adverse food reactions, not elsewhere classified, initial encounter    Personal history of noncompliance with medical treatment, presenting hazards to health 05/18/2015   Rash  and other nonspecific skin eruption    Restless legs syndrome    patient denies this dx   Sleep apnea    pt siad, i had small amount but could not tolerate CPAP and they said it was ok since it was mild.   Unspecified asthma, uncomplicated    Viral URI 07/13/2022   Zoster without complications    Past Surgical History:  Procedure Laterality Date   ABDOMINAL HYSTERECTOMY     BACK SURGERY  11/26/2022   BIOPSY  11/08/2022   Procedure: BIOPSY;  Surgeon: Shaaron Lamar HERO, MD;  [facility];  Service: Endoscopy;;   CATARACT EXTRACTION W/PHACO Left 03/09/2022   Procedure: CATARACT EXTRACTION PHACO AND INTRAOCULAR LENS PLACEMENT (IOC);  Surgeon: Harrie Agent, MD;  Location: [facility];  Service: Ophthalmology;  Laterality: Left;  CDE 7.89   CATARACT EXTRACTION W/PHACO Right 03/23/2022   Procedure: CATARACT EXTRACTION PHACO AND INTRAOCULAR LENS PLACEMENT (IOC);  Surgeon: Harrie Agent, MD;  Location: [facility];  Service: Ophthalmology;  Laterality: Right;  CDE: 2.63   CHOLECYSTECTOMY     COLONOSCOPY N/A 09/22/2012   RMR: colonic polyps -removed as described above. tubular adenoma, next TCS 09/2017   COLONOSCOPY WITH PROPOFOL  N/A 09/23/2017   Surgeon: Shaaron Lamar HERO, MD; internal  hemorrhoids, three 5-6 mm polyps removed, diverticulosis in sigmoid and descending colon.  Pathology with tubular adenomas and rectal hyperplastic polyp.  Recommended 5 year surveillance.   COLONOSCOPY WITH PROPOFOL  N/A 11/08/2022   Procedure: COLONOSCOPY WITH PROPOFOL ;  Surgeon: Shaaron Lamar HERO, MD;  [facility];  Service: Endoscopy;  Laterality: N/A;  815am, asa 3   CYST EXCISION Left 07/19/2016   Procedure: CYST REMOVAL LEFT RING FINGER;  Surgeon: Taft FORBES Minerva, MD;  Location: [facility];  Service: Orthopedics;  Laterality: Left;   CYST REMOVAL LEG Right    foot   ESOPHAGOGASTRODUODENOSCOPY N/A 10/21/2014   RMR: Small hiatal hernia; otherwise normal EGD status post passage of a Maloney dilator.     ESOPHAGOGASTRODUODENOSCOPY (EGD) WITH PROPOFOL  N/A 11/08/2022   Procedure: ESOPHAGOGASTRODUODENOSCOPY (EGD) WITH PROPOFOL ;  Surgeon: Shaaron Lamar HERO, MD;  [facility];  Service: Endoscopy;  Laterality: N/A;   EXCISION MASS UPPER EXTREMETIES Right 04/17/2018   Procedure: EXCISION MASS UPPER EXTREMETIES right ring finger;  Surgeon: Minerva Taft FORBES, MD;  Location: [facility];  Service: Orthopedics;  Laterality: Right;   LUMBAR LAMINECTOMY/DECOMPRESSION MICRODISCECTOMY Bilateral 11/26/2022   Procedure: Microdiscectomy - bilateral - Lumbar four-Lumbar five;  Surgeon: Louis Shove, MD;  Location[facility];  Service: Neurosurgery;  Laterality: Bilateral;   MALONEY DILATION N/A 10/21/2014   Procedure: AGAPITO DILATION;  Surgeon: Lamar HERO Shaaron, MD;  [facility];  Service: Endoscopy;  Laterality: N/A;   MALONEY DILATION N/A 11/08/2022   Procedure: AGAPITO DILATION;  Surgeon: Shaaron Lamar HERO, MD;  [facility];  Service: Endoscopy;  Laterality: N/A;   MIDDLE EAR SURGERY Right    patched hole in ear drum   POLYPECTOMY  09/23/2017   Procedure: POLYPECTOMY;  Surgeon: Shaaron Lamar HERO, MD;  [facility];  Service: Endoscopy;;  polyp hepatic flexure polyp cs, splenic flexure polyp cs, rectal polyp cs   POLYPECTOMY  11/08/2022   Procedure: POLYPECTOMY;  Surgeon: Shaaron Lamar HERO, MD;  [facility];  Service: Endoscopy;;   TOTAL ABDOMINAL HYSTERECTOMY     Patient Active Problem List   Diagnosis Date Noted   Acute recurrent maxillary sinusitis 04/09/2024   Subacute cough 03/02/2024   Sensorineural hearing loss, bilateral 10/31/2023   Primary osteoarthritis of right knee 10/17/2023   Acute cystitis without hematuria 06/19/2023   Acute bronchitis 05/24/2023   Chronic mastoiditis of right side 03/24/2023   Varicose veins of right lower extremity 01/22/2023   Acute pain of left lower extremity 10/25/2022   Primary insomnia 09/18/2022   Encounter for  examination following treatment at hospital 09/18/2022   Acute recurrent frontal sinusitis 07/20/2022   Lumbar radiculopathy 03/08/2022   Statin intolerance 01/19/2022   Encounter for general adult medical examination with abnormal findings 01/16/2022   Lumbar spondylosis 09/05/2021   Allergic rhinitis due to allergen 04/17/2021   Seborrheic keratosis 04/17/2021   Primary osteoarthritis involving multiple joints 12/02/2019   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   Long-term current use of opiate analgesic 09/08/2019   Neck pain 09/07/2019   Mild intermittent asthma without complication 05/21/2018   Gastroesophageal reflux disease 05/21/2018   History of colonic polyps 08/26/2017   Hypothyroidism 04/22/2017   Mixed hyperlipidemia 12/31/2016   Type 2 diabetes mellitus with hyperglycemia (HCC) 05/18/2015   Essential hypertension 05/18/2015   Morbid obesity (HCC) 05/18/2015   Fatty liver 02/14/2015   Hiatal hernia    Dysphagia 10/11/2014    PCP: Tobie Suzzane POUR,  MD  REFERRING PROVIDER: Louis Shove, MD   REFERRING DIAG: Radiculopathy, lumbar region   Rationale for Evaluation and Treatment: Rehabilitation  THERAPY DIAG:  Other low back pain  Muscle weakness (generalized)  Difficulty in walking, not elsewhere classified  Trochanteric bursitis of left hip  ONSET DATE: 02/26/24  SUBJECTIVE:                                                                                                                                                                                           SUBJECTIVE STATEMENT: Pt states she is not hurting much at all since last session which was yesterday states she just gets sore following sessions. Pt states she is compliant with HEP.  Eval: Patient reports that she began having back and left leg pain that began on 02/26/24 after getting back from a cruise. She has tried pain medication, but this doesn't seem to help. She had pain like  this year after getting back from a trip. Her pain has been slowly letting up, but it has not gone away yet.   PERTINENT HISTORY:  Hypertension, OA, history of COVID-19, asthma, type 2 diabetes, and hearing deficits  PAIN:  Are you having pain? Yes: NPRS scale: Current: 0/10 Best: 0/10 Worst: 8/10   Pain location: low back and left leg  Pain description: intermittent, sore, aching pain  Aggravating factors: walking Relieving factors: leaning on carts or tables  PRECAUTIONS: None  RED FLAGS: None   WEIGHT BEARING RESTRICTIONS: No  FALLS:  Has patient fallen in last 6 months? 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  DIAGNOSTIC FINDINGS: 04/16/24 lumbar MRI  IMPRESSION: Relatively stable degenerative spondylosis with mild dextroscoliotic curvature.   There is multilevel disc desiccation facet arthrosis most notably at L3-4 and L4-5 as above. There is relatively stable moderate to severe left foraminal narrowing at L3-4 secondary to left foraminal disc extrusion. Correlation for left L3 radiculopathy. There is caudal foraminal narrowing bilaterally at L4-5 secondary to disc osteophyte. Correlation for L4 radiculopathy.   No significant acute interval change. No spinal stenosis is identified.  PATIENT SURVEYS:  Modified Oswestry:  MODIFIED OSWESTRY DISABILITY SCALE  Date: 05/08/24 Score  Pain intensity 4 =  Pain medication provides me with little relief from pain.  2. Personal care (washing,  dressing, etc.) 0 =  I can take care of myself normally without causing increased pain.  3.  Lifting 4 = I can lift only very light weights  4. Walking 2 =  Pain prevents me from walking more than  mile.  5. Sitting 3 =  Pain prevents me from sitting more than  hour.  6. Standing 4 =  Pain prevents me from standing more than 10 minutes.  7. Sleeping 0 = Pain does not prevent me from sleeping well.  8. Social Life 1 =  My social life is normal, but it increases my level of pain.  9. Traveling 4 = My pain restricts my travel to short necessary journeys under 1/2 hour.  10. Employment/ Homemaking 2 = I can perform most of my homemaking/job duties, but pain prevents me from performing more physically stressful activities (eg, lifting, vacuuming).  Total 24/50   Interpretation of scores: Score Category Description  0-20% Minimal Disability The patient can cope with most living activities. Usually no treatment is indicated apart from advice on lifting, sitting and exercise  21-40% Moderate Disability The patient experiences more pain and difficulty with sitting, lifting and standing. Travel and social life are more difficult and they may be disabled from work. Personal care, sexual activity and sleeping are not grossly affected, and the patient can usually be managed by conservative means  41-60% Severe Disability Pain remains the main problem in this group, but activities of daily living are affected. These patients require a detailed investigation  61-80% Crippled Back pain impinges on all aspects of the patients life. Positive intervention is required  81-100% Bed-bound These patients are either bed-bound or exaggerating their symptoms  Bluford FORBES Zoe DELENA Karon DELENA, et al. Surgery versus conservative management of stable thoracolumbar fracture: the PRESTO feasibility RCT. [place] ([place]): [employer]; 2021 Nov. [facility] Technology Assessment, No. 25.62.) Appendix 3, Oswestry Disability Index category descriptors. Available from:  Findjewelers.cz  Minimally Clinically Important Difference (MCID) = 12.8%  COGNITION: Overall cognitive status: Within functional limits for tasks assessed     SENSATION: Patient reports no numbness or tingling  PALPATION: Familiar pain reproduced with palpation to left QL, TFL, and IT band   JOINT MOBILITY:  L1-5: mild hypomobility  LUMBAR ROM:   AROM eval  Flexion WFL  Extension 25% limited  Right lateral flexion WFL; I feel it  Left lateral flexion WFL; I feel it  Right rotation 25% limited  Left rotation 25% limited    (Blank rows = not tested)  LOWER EXTREMITY ROM: WFL for activities assessed  LOWER EXTREMITY MMT:    MMT Right eval Left eval  Hip flexion 4-/5 4-/5  Hip extension    Hip abduction    Hip adduction 5/5 5/5  Hip internal rotation    Hip external rotation    Knee flexion 4/5 4/5; familiar pain  Knee extension 5/5 4/5; I feel it   Ankle dorsiflexion 4+/5 4+/5  Ankle plantarflexion    Ankle inversion    Ankle eversion     (Blank rows = not tested)  FUNCTIONAL TESTS:  5 times sit to stand: 14.36 seconds 2 minute walk test: 497 feet  GAIT: Distance walked: 497 feet Assistive device utilized: None Level of assistance: Complete Independence Comments: No significant gait deviations observed  TREATMENT DATE:  05/27/2024  Therapeutic Exercise: -Nustep, 5 minutes, level 7 resistance, pt cued for 70-80 spm -Supine bridges with gtb at knees, 2 sets of 10 reps, 3 second holds, pt cued for max hip extension -Monster walks, 2 laps 20 feet per lap, with RTB around ankles, pt cued for upright posture and athletic stance -Side lying clamshells, 1 set of 15 reps, bilaterally, gtb at knees, pt cued for sequencing -Double knees to chest, 2 sets of 10 reps, on green theraball, pt cued for max pain  free ROM and smooth motion Neuromuscular Re-education: -Lower trunk rotations on green exercise ball, 2 set of 10 reps, bilaterally, pt cued to remain in pain free ROM -Paloff press, 2 set of 10 reps, GTB at chest level on web slide, pt cued for sequencing, second set on blue foam pad -Bird dog, 1 set of 2 reps, bilaterally, pt requests to cease due to balance and knee pain Therapeutic Activity: -Lateral stepping, 2 laps, 20 feet per lap, with RTB around ankles, pt cued for upright posture and slight bend in knees -Sit to stands with 10lb kettle bell with shoulder press, 2 sets of 8 reps, pt cued for core activation and keeping weight close to chest                                     05/26/24 EXERCISE LOG  Exercise Repetitions and Resistance Comments  Nustep  L4 x 6 minutes    Isometric ball press  15 reps w/ 5 second    Standing ball roll out  10 reps each  Multidirectional   Rocker board   2 minutes  AP; BUE support from parallel bars   Pallof press   GTB x 20 reps each    Lateral step up 6 step x 20 reps each    Resisted pull down  GTB x 20 reps    Resisted punch out  GTB x 20 reps each    Sit to stand  20 reps  With tidal tank at chest    Blank cell = exercise not performed today   05/08/24: PT evaluation, patient education, and HEP   PATIENT EDUCATION:  Education details: HEP, POC, and expectations for soreness Person educated: Patient Education method: Explanation Education comprehension: verbalized understanding  HOME EXERCISE PROGRAM: Access Code: [email or web address] URL: [email or web address] Date: 05/08/2024 Prepared by: Lacinda Fass  Exercises - Seated Flexion Stretch with Swiss Ball  - 1 x daily - 7 x weekly - 3 sets - 10 reps - Seated Abdominal Press into [employer]  - 1 x daily - 7 x weekly - 3 sets - 10 reps - 5 second hold  ASSESSMENT:  CLINICAL IMPRESSION: Patient continues to demonstrate decreased low back pain, decreased core/LE strength,  decreased functional mobility and balance. Patient also demonstrates decreased endurance with aerobic based exercise during today's session with increased resistance on nustep. Patient able to progress dynamic balance and core activation exercises today with bridge and resisted walking variations, good performance with verbal cueing. Patient would continue to benefit from skilled physical therapy for increased endurance with ambulation, increased LE/core strength, and improved balance for improved quality of life, improved independence with management of low back and continued progress towards therapy goals.     Eval: Patient is a 63 y.o. female who was seen today for physical therapy evaluation and treatment for left low back and lower extremity pain.  She presented  with low pain severity and irritability with palpation and left hamstring manual muscle testing reproducing her familiar symptoms.  Recommend that she continue with skilled physical therapy to address her impairments to return to her prior level of function.  OBJECTIVE IMPAIRMENTS: decreased activity tolerance, decreased knowledge of condition, decreased mobility, decreased ROM, decreased strength, hypomobility, impaired tone, and pain.   ACTIVITY LIMITATIONS: lifting and locomotion level  PARTICIPATION LIMITATIONS: shopping and community activity  PERSONAL FACTORS: Past/current experiences, Time since onset of injury/illness/exacerbation, and 3+ comorbidities: Hypertension, OA, history of COVID-19, asthma, type 2 diabetes, and hearing deficits are also affecting patient's functional outcome.   REHAB POTENTIAL: Good  CLINICAL DECISION MAKING: Stable/uncomplicated  EVALUATION COMPLEXITY: Low   GOALS: Goals reviewed with patient? Yes  LONG TERM GOALS: Target date: 06/05/24  Patient will be independent with her HEP. Baseline:  Goal status: INITIAL  2.  Patient will be able to complete her daily activities without her familiar  symptoms exceeding 5/10. Baseline:  Goal status: INITIAL  3.  Patient will improve her left quadricep strength to at least 4+/5 for improved function with transfers. Baseline:  Goal status: INITIAL  4.  Patient will improve her 5 times sit to stand time to 12 seconds or less to reduce her risk of falling. Baseline:  Goal status: INITIAL  5.  Patient will improve her ODI score to 14/50 or less for improved perceived function with her daily activities. Baseline:  Goal status: INITIAL  PLAN:  PT FREQUENCY: 2x/week  PT DURATION: 4 weeks  PLANNED INTERVENTIONS: 97164- PT Re-evaluation, 97750- Physical Performance Testing, 97110-Therapeutic exercises, 97530- Therapeutic activity, 97112- Neuromuscular re-education, 97535- Self Care, [id number]- Manual therapy, Patient/Family education, Balance training, Stair training, Joint mobilization, Spinal manipulation, Spinal mobilization, Cryotherapy, and Moist heat.  PLAN FOR NEXT SESSION: Update HEP as needed, manual therapy, lumbar stabilization, and lifting mechanics   Lang Ada, PT, DPT [facility] Office: [phone] 10:29 AM, 05/27/2024     "

## 2024-06-01 ENCOUNTER — Other Ambulatory Visit: Payer: Self-pay | Admitting: Allergy & Immunology

## 2024-06-03 ENCOUNTER — Ambulatory Visit (HOSPITAL_COMMUNITY)

## 2024-06-03 ENCOUNTER — Encounter (HOSPITAL_COMMUNITY): Payer: Self-pay

## 2024-06-03 DIAGNOSIS — M5459 Other low back pain: Secondary | ICD-10-CM

## 2024-06-03 DIAGNOSIS — M6281 Muscle weakness (generalized): Secondary | ICD-10-CM

## 2024-06-03 NOTE — Therapy (Signed)
 " OUTPATIENT PHYSICAL THERAPY THORACOLUMBAR TREATMENT    Patient Name: Sandy Valencia MRN: [id number] DOB:[birth year], 63 y.o., female Today's Date: 06/03/2024  END OF SESSION:  PT End of Session - 06/03/24 1038     Visit Number 4    Number of Visits 8    Date for Recertification  07/03/24    Authorization Type [employer]    Authorization Time Period 05/08/24-07/06/24    Authorization - Visit Number 3    Authorization - Number of Visits 6    Progress Note Due on Visit 6    PT Start Time 1038   late arrival   PT Stop Time 1112    PT Time Calculation (min) 34 min    Activity Tolerance Patient tolerated treatment well    Behavior During Therapy [facility] for tasks assessed/performed             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   Anaphylactic shock, unspecified, subsequent encounter    Anemia, iron deficiency 02/14/2015   patient is not aware this dx   Angio-edema    Arthritis    Asthma    Mild intermittent asthma   Asymptomatic varicose veins of right lower extremity    Chronic obstructive pulmonary disease with (acute) exacerbation (HCC)    patient denies this dx   Chronic rhinitis 05/21/2018   COVID-19 2019   Diabetes mellitus    type 2   Essential (primary) hypertension    Family history of colon cancer 09/03/2012   Fatty liver    Gastro-esophageal reflux disease without esophagitis    GERD (gastroesophageal reflux disease)    Hearing loss    wears hearing aid   History of colonic polyps 08/26/2017   Hypercholesteremia    Hypertension    Hypothyroidism    Low back pain    and left leg pain   Mass of finger, right s/p surgical excision (mucoid cyst) 04/17/18 04/22/2018   Mucous cyst of digit of right hand    Obesity, unspecified    Other adverse food reactions, not elsewhere classified, initial encounter    Personal history of noncompliance with medical  treatment, presenting hazards to health 05/18/2015   Rash and other nonspecific skin eruption    Restless legs syndrome    patient denies this dx   Sleep apnea    pt siad, i had small amount but could not tolerate CPAP and they said it was ok since it was mild.   Unspecified asthma, uncomplicated    Viral URI 07/13/2022   Zoster without complications    Past Surgical History:  Procedure Laterality Date   ABDOMINAL HYSTERECTOMY     BACK SURGERY  11/26/2022   BIOPSY  11/08/2022   Procedure: BIOPSY;  Surgeon: Shaaron Lamar HERO, MD;  [facility];  Service: Endoscopy;;   CATARACT EXTRACTION W/PHACO Left 03/09/2022   Procedure: CATARACT EXTRACTION PHACO AND INTRAOCULAR LENS PLACEMENT (IOC);  Surgeon: Harrie Agent, MD;  Location: [facility];  Service: Ophthalmology;  Laterality: Left;  CDE 7.89   CATARACT EXTRACTION W/PHACO Right 03/23/2022   Procedure: CATARACT EXTRACTION PHACO AND INTRAOCULAR LENS PLACEMENT (IOC);  Surgeon: Harrie Agent, MD;  Location: [facility];  Service: Ophthalmology;  Laterality: Right;  CDE: 2.63   CHOLECYSTECTOMY     COLONOSCOPY N/A 09/22/2012   RMR: colonic polyps -removed as described above. tubular adenoma, next TCS 09/2017  COLONOSCOPY WITH PROPOFOL  N/A 09/23/2017   Surgeon: Shaaron Lamar HERO, MD; internal hemorrhoids, three 5-6 mm polyps removed, diverticulosis in sigmoid and descending colon.  Pathology with tubular adenomas and rectal hyperplastic polyp.  Recommended 5 year surveillance.   COLONOSCOPY WITH PROPOFOL  N/A 11/08/2022   Procedure: COLONOSCOPY WITH PROPOFOL ;  Surgeon: Shaaron Lamar HERO, MD;  [facility];  Service: Endoscopy;  Laterality: N/A;  815am, asa 3   CYST EXCISION Left 07/19/2016   Procedure: CYST REMOVAL LEFT RING FINGER;  Surgeon: Taft FORBES Minerva, MD;  Location: [facility];  Service: Orthopedics;  Laterality: Left;   CYST REMOVAL LEG Right    foot   ESOPHAGOGASTRODUODENOSCOPY N/A 10/21/2014   RMR: Small hiatal hernia;  otherwise normal EGD status post passage of a Maloney dilator.    ESOPHAGOGASTRODUODENOSCOPY (EGD) WITH PROPOFOL  N/A 11/08/2022   Procedure: ESOPHAGOGASTRODUODENOSCOPY (EGD) WITH PROPOFOL ;  Surgeon: Shaaron Lamar HERO, MD;  [facility];  Service: Endoscopy;  Laterality: N/A;   EXCISION MASS UPPER EXTREMETIES Right 04/17/2018   Procedure: EXCISION MASS UPPER EXTREMETIES right ring finger;  Surgeon: Minerva Taft FORBES, MD;  Location: [facility];  Service: Orthopedics;  Laterality: Right;   LUMBAR LAMINECTOMY/DECOMPRESSION MICRODISCECTOMY Bilateral 11/26/2022   Procedure: Microdiscectomy - bilateral - Lumbar four-Lumbar five;  Surgeon: Louis Shove, MD;  Location: [facility];  Service: Neurosurgery;  Laterality: Bilateral;   MALONEY DILATION N/A 10/21/2014   Procedure: AGAPITO DILATION;  Surgeon: Lamar HERO Shaaron, MD;  [facility];  Service: Endoscopy;  Laterality: N/A;   MALONEY DILATION N/A 11/08/2022   Procedure: AGAPITO DILATION;  Surgeon: Shaaron Lamar HERO, MD;  [facility];  Service: Endoscopy;  Laterality: N/A;   MIDDLE EAR SURGERY Right    patched hole in ear drum   POLYPECTOMY  09/23/2017   Procedure: POLYPECTOMY;  Surgeon: Shaaron Lamar HERO, MD;  [facility];  Service: Endoscopy;;  polyp hepatic flexure polyp cs, splenic flexure polyp cs, rectal polyp cs   POLYPECTOMY  11/08/2022   Procedure: POLYPECTOMY;  Surgeon: Shaaron Lamar HERO, MD;  [facility];  Service: Endoscopy;;   TOTAL ABDOMINAL HYSTERECTOMY     Patient Active Problem List   Diagnosis Date Noted   Acute recurrent maxillary sinusitis 04/09/2024   Subacute cough 03/02/2024   Sensorineural hearing loss, bilateral 10/31/2023   Primary osteoarthritis of right knee 10/17/2023   Acute cystitis without hematuria 06/19/2023   Acute bronchitis 05/24/2023   Chronic mastoiditis of right side 03/24/2023   Varicose veins of right lower extremity 01/22/2023   Acute pain of left lower extremity  10/25/2022   Primary insomnia 09/18/2022   Encounter for examination following treatment at hospital 09/18/2022   Acute recurrent frontal sinusitis 07/20/2022   Lumbar radiculopathy 03/08/2022   Statin intolerance 01/19/2022   Encounter for general adult medical examination with abnormal findings 01/16/2022   Lumbar spondylosis 09/05/2021   Allergic rhinitis due to allergen 04/17/2021   Seborrheic keratosis 04/17/2021   Primary osteoarthritis involving multiple joints 12/02/2019   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   Long-term current use of opiate analgesic 09/08/2019   Neck pain 09/07/2019   Mild intermittent asthma without complication 05/21/2018   Gastroesophageal reflux disease 05/21/2018   History of colonic polyps 08/26/2017   Hypothyroidism 04/22/2017   Mixed hyperlipidemia 12/31/2016   Type 2 diabetes mellitus with hyperglycemia (HCC) 05/18/2015   Essential hypertension 05/18/2015   Morbid obesity (HCC) 05/18/2015   Fatty liver 02/14/2015   Hiatal  hernia    Dysphagia 10/11/2014    PCP: Tobie Suzzane POUR, MD  REFERRING PROVIDER: Louis Shove, MD   REFERRING DIAG: Radiculopathy, lumbar region   Rationale for Evaluation and Treatment: Rehabilitation  THERAPY DIAG:  Other low back pain  Muscle weakness (generalized)  ONSET DATE: 02/26/24  SUBJECTIVE:                                                                                                                                                                                           SUBJECTIVE STATEMENT: Pt states she was a little sore since last session, mainly in the legs. Pt states she is compliant with HEP tried the one on the bed. Pt states she is scheduled for back injection the 5th.  Eval: Patient reports that she began having back and left leg pain that began on 02/26/24 after getting back from a cruise. She has tried pain medication, but this doesn't seem to help. She had pain like this  year after getting back from a trip. Her pain has been slowly letting up, but it has not gone away yet.   PERTINENT HISTORY:  Hypertension, OA, history of COVID-19, asthma, type 2 diabetes, and hearing deficits  PAIN:  Are you having pain? Yes: NPRS scale: Current: 0/10 Best: 0/10 Worst: 8/10   Pain location: low back and left leg  Pain description: intermittent, sore, aching pain  Aggravating factors: walking Relieving factors: leaning on carts or tables  PRECAUTIONS: None  RED FLAGS: None   WEIGHT BEARING RESTRICTIONS: No  FALLS:  Has patient fallen in last 6 months? 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  DIAGNOSTIC FINDINGS: 04/16/24 lumbar MRI  IMPRESSION: Relatively stable degenerative spondylosis with mild dextroscoliotic curvature.   There is multilevel disc desiccation facet arthrosis most notably at L3-4 and L4-5 as above. There is relatively stable moderate to severe left foraminal narrowing at L3-4 secondary to left foraminal disc extrusion. Correlation for left L3 radiculopathy. There is caudal foraminal narrowing bilaterally at L4-5 secondary to disc osteophyte. Correlation for L4 radiculopathy.   No significant acute interval change. No spinal stenosis is identified.  PATIENT SURVEYS:  Modified Oswestry:  MODIFIED OSWESTRY DISABILITY SCALE  Date: 05/08/24 Score  Pain intensity 4 =  Pain medication provides me with little  relief from pain.  2. Personal care (washing, dressing, etc.) 0 =  I can take care of myself normally without causing increased pain.  3.  Lifting 4 = I can lift only very light weights  4. Walking 2 =  Pain prevents me from walking more than  mile.  5. Sitting 3 =  Pain prevents me from sitting more than  hour.  6. Standing 4 =  Pain prevents me from standing more than 10 minutes.  7. Sleeping 0 = Pain does not prevent me from sleeping well.  8. Social Life 1 =  My social life is normal, but it increases my level of pain.  9. Traveling 4 = My pain restricts my travel to short necessary journeys under 1/2 hour.  10. Employment/ Homemaking 2 = I can perform most of my homemaking/job duties, but pain prevents me from performing more physically stressful activities (eg, lifting, vacuuming).  Total 24/50   Interpretation of scores: Score Category Description  0-20% Minimal Disability The patient can cope with most living activities. Usually no treatment is indicated apart from advice on lifting, sitting and exercise  21-40% Moderate Disability The patient experiences more pain and difficulty with sitting, lifting and standing. Travel and social life are more difficult and they may be disabled from work. Personal care, sexual activity and sleeping are not grossly affected, and the patient can usually be managed by conservative means  41-60% Severe Disability Pain remains the main problem in this group, but activities of daily living are affected. These patients require a detailed investigation  61-80% Crippled Back pain impinges on all aspects of the patients life. Positive intervention is required  81-100% Bed-bound These patients are either bed-bound or exaggerating their symptoms  Bluford FORBES Zoe DELENA Karon DELENA, et al. Surgery versus conservative management of stable thoracolumbar fracture: the PRESTO feasibility RCT. [place] ([place]): [employer]; 2021 Nov. [facility] [employer] Assessment, No. 25.62.) Appendix 3, Oswestry Disability Index category descriptors. Available from:  Findjewelers[employer]cz  Minimally Clinically Important Difference (MCID) = 12.8%  COGNITION: Overall cognitive status: Within functional limits for tasks assessed     SENSATION: Patient reports no numbness or tingling  PALPATION: Familiar pain reproduced with palpation to left QL, TFL, and IT band   JOINT MOBILITY:  L1-5: mild hypomobility  LUMBAR ROM:   AROM eval  Flexion WFL  Extension 25% limited  Right lateral flexion WFL; I feel it  Left lateral flexion WFL; I feel it  Right rotation 25% limited  Left rotation 25% limited    (Blank rows = not tested)  LOWER EXTREMITY ROM: WFL for activities assessed  LOWER EXTREMITY MMT:    MMT Right eval Left eval  Hip flexion 4-/5 4-/5  Hip extension    Hip abduction    Hip adduction 5/5 5/5  Hip internal rotation    Hip external rotation    Knee flexion 4/5 4/5; familiar pain  Knee extension 5/5 4/5; I feel it   Ankle dorsiflexion 4+/5 4+/5  Ankle plantarflexion    Ankle inversion    Ankle eversion     (Blank rows = not tested)  FUNCTIONAL TESTS:  5 times sit to stand: 14.36 seconds 2 minute walk test: 497 feet  GAIT: Distance walked: 497 feet Assistive device utilized: None Level of assistance: Complete Independence Comments: No significant gait deviations observed  TREATMENT DATE:  06/03/2024  Therapeutic Exercise: -Nustep, 5 minutes, level 4-8 resistance, pt cued for 70-80 spm -Supine bridges with green ball, 2 sets of 10 reps, 3 second holds, pt cued for max hip extension -Side lying planks from knees, 1 set of (4 on left, 7 on right) reps, bilaterally, 5 second holds, pt cued for sequencing -Double knees to chest, 2 sets of 10 reps, on green theraball, pt cued for max pain free ROM and smooth motion Neuromuscular Re-education: -Lower trunk rotations on  green exercise ball, 2 set of 10 reps, bilaterally, pt cued to remain in pain free ROM -3 way kicks, 1 set of 8 reps bilaterally, RTB at ankles, pt cued for proper sequencing -Bird dog, 1 set of 7 reps, bilaterally, pillow under knees for increased pt comfort Therapeutic Activity: -Box lifts from floor, 4 sets of 5 reps, pt cued for upright posture and bend in knees, 10>15>20 lbs of weight in box   05/27/2024  Therapeutic Exercise: -Nustep, 5 minutes, level 7 resistance, pt cued for 70-80 spm -Supine bridges with gtb at knees, 2 sets of 10 reps, 3 second holds, pt cued for max hip extension -Monster walks, 2 laps 20 feet per lap, with RTB around ankles, pt cued for upright posture and athletic stance -Side lying clamshells, 1 set of 15 reps, bilaterally, gtb at knees, pt cued for sequencing -Double knees to chest, 2 sets of 10 reps, on green theraball, pt cued for max pain free ROM and smooth motion Neuromuscular Re-education: -Lower trunk rotations on green exercise ball, 2 set of 10 reps, bilaterally, pt cued to remain in pain free ROM -Paloff press, 2 set of 10 reps, GTB at chest level on web slide, pt cued for sequencing, second set on blue foam pad -Bird dog, 1 set of 2 reps, bilaterally, pt requests to cease due to balance and knee pain Therapeutic Activity: -Lateral stepping, 2 laps, 20 feet per lap, with RTB around ankles, pt cued for upright posture and slight bend in knees -Sit to stands with 10lb kettle bell with shoulder press, 2 sets of 8 reps, pt cued for core activation and keeping weight close to chest                                     05/26/24 EXERCISE LOG  Exercise Repetitions and Resistance Comments  Nustep  L4 x 6 minutes    Isometric ball press  15 reps w/ 5 second    Standing ball roll out  10 reps each  Multidirectional   Rocker board   2 minutes  AP; BUE support from parallel bars   Pallof press   GTB x 20 reps each    Lateral step up 6 step x 20 reps each     Resisted pull down  GTB x 20 reps    Resisted punch out  GTB x 20 reps each    Sit to stand  20 reps  With tidal tank at chest    Blank cell = exercise not performed today    PATIENT EDUCATION:  Education details: HEP, POC, and expectations for soreness Person educated: Patient Education method: Explanation Education comprehension: verbalized understanding  HOME EXERCISE PROGRAM: Access Code: [email or web address] URL: [email or web address] Date: 05/08/2024 Prepared by: Lacinda Fass  Exercises - Seated Flexion Stretch with Swiss Ball  - 1 x daily - 7 x weekly - 3 sets - 10 reps - Seated  Abdominal Press into [employer]  - 1 x daily - 7 x weekly - 3 sets - 10 reps - 5 second hold  ASSESSMENT:  CLINICAL IMPRESSION: Patient continues to demonstrate decreased low back pain, decreased core/LE strength, improved functional mobility and balance. Patient also demonstrates decreased endurance with isometric core work during today's session with side planks. Patient able to progress dynamic balance and core activation exercises today with bridge and resisted kick variations, good performance with verbal cueing. Patient would continue to benefit from skilled physical therapy for increased endurance with ambulation, increased LE/core strength, and improved balance for improved quality of life, improved independence with management of low back and continued progress towards therapy goals.     Eval: Patient is a 64 y.o. female who was seen today for physical therapy evaluation and treatment for left low back and lower extremity pain.  She presented with low pain severity and irritability with palpation and left hamstring manual muscle testing reproducing her familiar symptoms.  Recommend that she continue with skilled physical therapy to address her impairments to return to her prior level of function.  OBJECTIVE IMPAIRMENTS: decreased activity tolerance, decreased knowledge of condition,  decreased mobility, decreased ROM, decreased strength, hypomobility, impaired tone, and pain.   ACTIVITY LIMITATIONS: lifting and locomotion level  PARTICIPATION LIMITATIONS: shopping and community activity  PERSONAL FACTORS: Past/current experiences, Time since onset of injury/illness/exacerbation, and 3+ comorbidities: Hypertension, OA, history of COVID-19, asthma, type 2 diabetes, and hearing deficits are also affecting patient's functional outcome.   REHAB POTENTIAL: Good  CLINICAL DECISION MAKING: Stable/uncomplicated  EVALUATION COMPLEXITY: Low   GOALS: Goals reviewed with patient? Yes  LONG TERM GOALS: Target date: 06/05/24  Patient will be independent with her HEP. Baseline:  Goal status: INITIAL  2.  Patient will be able to complete her daily activities without her familiar symptoms exceeding 5/10. Baseline:  Goal status: INITIAL  3.  Patient will improve her left quadricep strength to at least 4+/5 for improved function with transfers. Baseline:  Goal status: INITIAL  4.  Patient will improve her 5 times sit to stand time to 12 seconds or less to reduce her risk of falling. Baseline:  Goal status: INITIAL  5.  Patient will improve her ODI score to 14/50 or less for improved perceived function with her daily activities. Baseline:  Goal status: INITIAL  PLAN:  PT FREQUENCY: 2x/week  PT DURATION: 4 weeks  PLANNED INTERVENTIONS: 97164- PT Re-evaluation, 97750- Physical Performance Testing, 97110-Therapeutic exercises, 97530- Therapeutic activity, 97112- Neuromuscular re-education, 97535- Self Care, [id number]- Manual therapy, Patient/Family education, Balance training, Stair training, Joint mobilization, Spinal manipulation, Spinal mobilization, Cryotherapy, and Moist heat.  PLAN FOR NEXT SESSION: Update HEP as needed, manual therapy, lumbar stabilization, and progress lifting mechanics   Lang Ada, PT, DPT [facility] Office: [phone] 11:14 AM, 06/03/2024     "

## 2024-06-09 ENCOUNTER — Ambulatory Visit (HOSPITAL_COMMUNITY): Admitting: Physical Therapy

## 2024-06-12 ENCOUNTER — Encounter (HOSPITAL_COMMUNITY): Payer: Self-pay

## 2024-06-12 ENCOUNTER — Ambulatory Visit (HOSPITAL_COMMUNITY): Attending: Neurosurgery

## 2024-06-12 ENCOUNTER — Other Ambulatory Visit: Payer: Self-pay | Admitting: Allergy & Immunology

## 2024-06-12 DIAGNOSIS — M6281 Muscle weakness (generalized): Secondary | ICD-10-CM | POA: Diagnosis present

## 2024-06-12 DIAGNOSIS — M5459 Other low back pain: Secondary | ICD-10-CM | POA: Insufficient documentation

## 2024-06-12 DIAGNOSIS — M7062 Trochanteric bursitis, left hip: Secondary | ICD-10-CM | POA: Insufficient documentation

## 2024-06-12 DIAGNOSIS — R262 Difficulty in walking, not elsewhere classified: Secondary | ICD-10-CM | POA: Diagnosis present

## 2024-06-12 NOTE — Therapy (Signed)
 " OUTPATIENT PHYSICAL THERAPY THORACOLUMBAR TREATMENT    Patient Name: Sandy Valencia MRN: [id number] DOB:1961-03-06, 64 y.o., female Today's Date: 06/12/2024  END OF SESSION:  PT End of Session - 06/12/24 1102     Visit Number 5    Number of Visits 8    Date for Recertification  07/03/24    Authorization Type [employer]    Authorization Time Period 05/08/24-07/06/24    Authorization - Visit Number 4    Authorization - Number of Visits 6    Progress Note Due on Visit 6    PT Start Time 1105    PT Stop Time 1143    PT Time Calculation (min) 38 min    Activity Tolerance Patient tolerated treatment well    Behavior During Therapy WFL for tasks assessed/performed              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   Anaphylactic shock, unspecified, subsequent encounter    Anemia, iron deficiency 02/14/2015   patient is not aware this dx   Angio-edema    Arthritis    Asthma    Mild intermittent asthma   Asymptomatic varicose veins of right lower extremity    Chronic obstructive pulmonary disease with (acute) exacerbation (HCC)    patient denies this dx   Chronic rhinitis 05/21/2018   COVID-19 2019   Diabetes mellitus    type 2   Essential (primary) hypertension    Family history of colon cancer 09/03/2012   Fatty liver    Gastro-esophageal reflux disease without esophagitis    GERD (gastroesophageal reflux disease)    Hearing loss    wears hearing aid   History of colonic polyps 08/26/2017   Hypercholesteremia    Hypertension    Hypothyroidism    Low back pain    and left leg pain   Mass of finger, right s/p surgical excision (mucoid cyst) 04/17/18 04/22/2018   Mucous cyst of digit of right hand    Obesity, unspecified    Other adverse food reactions, not elsewhere classified, initial encounter    Personal history of noncompliance with medical treatment, presenting  hazards to health 05/18/2015   Rash and other nonspecific skin eruption    Restless legs syndrome    patient denies this dx   Sleep apnea    pt siad, i had small amount but could not tolerate CPAP and they said it was ok since it was mild.   Unspecified asthma, uncomplicated    Viral URI 07/13/2022   Zoster without complications    Past Surgical History:  Procedure Laterality Date   ABDOMINAL HYSTERECTOMY     BACK SURGERY  11/26/2022   BIOPSY  11/08/2022   Procedure: BIOPSY;  Surgeon: Shaaron Lamar HERO, MD;  [facility];  Service: Endoscopy;;   CATARACT EXTRACTION W/PHACO Left 03/09/2022   Procedure: CATARACT EXTRACTION PHACO AND INTRAOCULAR LENS PLACEMENT (IOC);  Surgeon: Harrie Agent, MD;  Location: [facility];  Service: Ophthalmology;  Laterality: Left;  CDE 7.89   CATARACT EXTRACTION W/PHACO Right 03/23/2022   Procedure: CATARACT EXTRACTION PHACO AND INTRAOCULAR LENS PLACEMENT (IOC);  Surgeon: Harrie Agent, MD;  Location: [facility];  Service: Ophthalmology;  Laterality: Right;  CDE: 2.63   CHOLECYSTECTOMY     COLONOSCOPY N/A 09/22/2012   RMR: colonic polyps -removed as described above. tubular adenoma, next TCS 09/2017   COLONOSCOPY WITH  PROPOFOL  N/A 09/23/2017   Surgeon: Shaaron Lamar HERO, MD; internal hemorrhoids, three 5-6 mm polyps removed, diverticulosis in sigmoid and descending colon.  Pathology with tubular adenomas and rectal hyperplastic polyp.  Recommended 5 year surveillance.   COLONOSCOPY WITH PROPOFOL  N/A 11/08/2022   Procedure: COLONOSCOPY WITH PROPOFOL ;  Surgeon: Shaaron Lamar HERO, MD;  [facility]: [facility];  Service: Endoscopy;  Laterality: N/A;  815am, asa 3   CYST EXCISION Left 07/19/2016   Procedure: CYST REMOVAL LEFT RING FINGER;  Surgeon: Taft FORBES Minerva, MD;  Location: [facility];  Service: Orthopedics;  Laterality: Left;   CYST REMOVAL LEG Right    foot   ESOPHAGOGASTRODUODENOSCOPY N/A 10/21/2014   RMR: Small hiatal hernia; otherwise normal EGD status  post passage of a Maloney dilator.    ESOPHAGOGASTRODUODENOSCOPY (EGD) WITH PROPOFOL  N/A 11/08/2022   Procedure: ESOPHAGOGASTRODUODENOSCOPY (EGD) WITH PROPOFOL ;  Surgeon: Shaaron Lamar HERO, MD;  [facility];  Service: Endoscopy;  Laterality: N/A;   EXCISION MASS UPPER EXTREMETIES Right 04/17/2018   Procedure: EXCISION MASS UPPER EXTREMETIES right ring finger;  Surgeon: Minerva Taft FORBES, MD;  Location: [facility];  Service: Orthopedics;  Laterality: Right;   LUMBAR LAMINECTOMY/DECOMPRESSION MICRODISCECTOMY Bilateral 11/26/2022   Procedure: Microdiscectomy - bilateral - Lumbar four-Lumbar five;  Surgeon: Louis Shove, MD;  [facility];  Service: Neurosurgery;  Laterality: Bilateral;   MALONEY DILATION N/A 10/21/2014   Procedure: AGAPITO DILATION;  Surgeon: Lamar HERO Shaaron, MD;  [facility];  Service: Endoscopy;  Laterality: N/A;   MALONEY DILATION N/A 11/08/2022   Procedure: AGAPITO DILATION;  Surgeon: Shaaron Lamar HERO, MD;  [facility];  Service: Endoscopy;  Laterality: N/A;   MIDDLE EAR SURGERY Right    patched hole in ear drum   POLYPECTOMY  09/23/2017   Procedure: POLYPECTOMY;  Surgeon: Shaaron Lamar HERO, MD;  [facility];  Service: Endoscopy;;  polyp hepatic flexure polyp cs, splenic flexure polyp cs, rectal polyp cs   POLYPECTOMY  11/08/2022   Procedure: POLYPECTOMY;  Surgeon: Shaaron Lamar HERO, MD;  [facility];  Service: Endoscopy;;   TOTAL ABDOMINAL HYSTERECTOMY     Patient Active Problem List   Diagnosis Date Noted   Acute recurrent maxillary sinusitis 04/09/2024   Subacute cough 03/02/2024   Sensorineural hearing loss, bilateral 10/31/2023   Primary osteoarthritis of right knee 10/17/2023   Acute cystitis without hematuria 06/19/2023   Acute bronchitis 05/24/2023   Chronic mastoiditis of right side 03/24/2023   Varicose veins of right lower extremity 01/22/2023   Acute pain of left lower extremity 10/25/2022   Primary  insomnia 09/18/2022   Encounter for examination following treatment at hospital 09/18/2022   Acute recurrent frontal sinusitis 07/20/2022   Lumbar radiculopathy 03/08/2022   Statin intolerance 01/19/2022   Encounter for general adult medical examination with abnormal findings 01/16/2022   Lumbar spondylosis 09/05/2021   Allergic rhinitis due to allergen 04/17/2021   Seborrheic keratosis 04/17/2021   Primary osteoarthritis involving multiple joints 12/02/2019   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   Long-term current use of opiate analgesic 09/08/2019   Neck pain 09/07/2019   Mild intermittent asthma without complication 05/21/2018   Gastroesophageal reflux disease 05/21/2018   History of colonic polyps 08/26/2017   Hypothyroidism 04/22/2017   Mixed hyperlipidemia 12/31/2016   Type 2 diabetes mellitus with hyperglycemia (HCC) 05/18/2015   Essential hypertension 05/18/2015   Morbid obesity (HCC) 05/18/2015   Fatty liver 02/14/2015   Hiatal hernia  Dysphagia 10/11/2014    PCP: Tobie Suzzane POUR, MD  REFERRING PROVIDER: Louis Shove, MD   REFERRING DIAG: Radiculopathy, lumbar region   Rationale for Evaluation and Treatment: Rehabilitation  THERAPY DIAG:  Other low back pain  Muscle weakness (generalized)  Difficulty in walking, not elsewhere classified  Trochanteric bursitis of left hip  ONSET DATE: 02/26/24  SUBJECTIVE:                                                                                                                                                                                           SUBJECTIVE STATEMENT: Pt states injection helped, no back pain this date. Pt states she didn't come Monday due to the injection.   Eval: Patient reports that she began having back and left leg pain that began on 02/26/24 after getting back from a cruise. She has tried pain medication, but this doesn't seem to help. She had pain like this year after  getting back from a trip. Her pain has been slowly letting up, but it has not gone away yet.   PERTINENT HISTORY:  Hypertension, OA, history of COVID-19, asthma, type 2 diabetes, and hearing deficits  PAIN:  Are you having pain? Yes: NPRS scale: Current: 0/10 Best: 0/10 Worst: 8/10   Pain location: low back and left leg  Pain description: intermittent, sore, aching pain  Aggravating factors: walking Relieving factors: leaning on carts or tables  PRECAUTIONS: None  RED FLAGS: None   WEIGHT BEARING RESTRICTIONS: No  FALLS:  Has patient fallen in last 6 months? 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  DIAGNOSTIC FINDINGS: 04/16/24 lumbar MRI  IMPRESSION: Relatively stable degenerative spondylosis with mild dextroscoliotic curvature.   There is multilevel disc desiccation facet arthrosis most notably at L3-4 and L4-5 as above. There is relatively stable moderate to severe left foraminal narrowing at L3-4 secondary to left foraminal disc extrusion. Correlation for left L3 radiculopathy. There is caudal foraminal narrowing bilaterally at L4-5 secondary to disc osteophyte. Correlation for L4 radiculopathy.   No significant acute interval change. No spinal stenosis is identified.  PATIENT SURVEYS:  Modified Oswestry:  MODIFIED OSWESTRY DISABILITY SCALE  Date: 05/08/24 Score  Pain intensity 4 =  Pain medication provides me with little relief from pain.  2. Personal care (washing,  dressing, etc.) 0 =  I can take care of myself normally without causing increased pain.  3. Lifting 4 = I  can lift only very light weights  4. Walking 2 =  Pain prevents me from walking more than  mile.  5. Sitting 3 =  Pain prevents me from sitting more than  hour.  6. Standing 4 =  Pain prevents me from standing more than 10 minutes.  7. Sleeping 0 = Pain does not prevent me from sleeping well.  8. Social Life 1 =  My social life is normal, but it increases my level of pain.  9. Traveling 4 = My pain restricts my travel to short necessary journeys under 1/2 hour.  10. Employment/ Homemaking 2 = I can perform most of my homemaking/job duties, but pain prevents me from performing more physically stressful activities (eg, lifting, vacuuming).  Total 24/50   Interpretation of scores: Score Category Description  0-20% Minimal Disability The patient can cope with most living activities. Usually no treatment is indicated apart from advice on lifting, sitting and exercise  21-40% Moderate Disability The patient experiences more pain and difficulty with sitting, lifting and standing. Travel and social life are more difficult and they may be disabled from work. Personal care, sexual activity and sleeping are not grossly affected, and the patient can usually be managed by conservative means  41-60% Severe Disability Pain remains the main problem in this group, but activities of daily living are affected. These patients require a detailed investigation  61-80% Crippled Back pain impinges on all aspects of the patients life. Positive intervention is required  81-100% Bed-bound These patients are either bed-bound or exaggerating their symptoms  Bluford FORBES Zoe DELENA Karon DELENA, et al. Surgery versus conservative management of stable thoracolumbar fracture: the PRESTO feasibility RCT. [place] ([place]): [employer]; 2021 Nov. [facility] Technology Assessment, No. 25.62.) Appendix 3, Oswestry Disability Index category descriptors. Available from: Findjewelers.cz  Minimally  Clinically Important Difference (MCID) = 12.8%  COGNITION: Overall cognitive status: Within functional limits for tasks assessed     SENSATION: Patient reports no numbness or tingling  PALPATION: Familiar pain reproduced with palpation to left QL, TFL, and IT band   JOINT MOBILITY:  L1-5: mild hypomobility  LUMBAR ROM:   AROM eval  Flexion WFL  Extension 25% limited  Right lateral flexion WFL; I feel it  Left lateral flexion WFL; I feel it  Right rotation 25% limited  Left rotation 25% limited    (Blank rows = not tested)  LOWER EXTREMITY ROM: WFL for activities assessed  LOWER EXTREMITY MMT:    MMT Right eval Left eval  Hip flexion 4-/5 4-/5  Hip extension    Hip abduction    Hip adduction 5/5 5/5  Hip internal rotation    Hip external rotation    Knee flexion 4/5 4/5; familiar pain  Knee extension 5/5 4/5; I feel it   Ankle dorsiflexion 4+/5 4+/5  Ankle plantarflexion    Ankle inversion    Ankle eversion     (Blank rows = not tested)  FUNCTIONAL TESTS:  5 times sit to stand: 14.36 seconds 2 minute walk test: 497 feet  GAIT: Distance walked: 497 feet Assistive device utilized: None Level of assistance: Complete Independence Comments: No significant gait deviations observed  TREATMENT DATE:  06/12/2024  Therapeutic Exercise: -Treadmill, 3.5 minutes, 0 incline, speed 1.0>1.5 Neuromuscular Re-education: -Bosu ball lunges, 1 set of 8 reps bilaterally, pt cued for decreased ROM to avoid knee pain -Bosu ball squat, 1 set of 8 reps, pt cued for sequencing -Dorsiflexion marches with 10lb kettle bell off 6 inch step, pt cued for decreased UE support -Body craft pallof walkouts, 3 laps bilaterally, 10 steps per lap, plate 2, pt cued for decreased  -Bird dog, 1 set of 10 reps, bilaterally, pillow under knees for increased pt  comfort Therapeutic Activity: -Forward lunges on bosu ball, 1 set of 8 reps bilaterally -Kettle bell swing, 3 sets of 8 reps, pt cued for sequencing -Trampoline toss with blue and orange ball, 1 set of 15 with blue and 1 set of 10 reps with orange, standing on blue foam for balance/core stabilization  06/03/2024  Therapeutic Exercise: -Nustep, 5 minutes, level 4-8 resistance, pt cued for 70-80 spm -Supine bridges with green ball, 2 sets of 10 reps, 3 second holds, pt cued for max hip extension -Side lying planks from knees, 1 set of (4 on left, 7 on right) reps, bilaterally, 5 second holds, pt cued for sequencing -Double knees to chest, 2 sets of 10 reps, on green theraball, pt cued for max pain free ROM and smooth motion Neuromuscular Re-education: -Lower trunk rotations on green exercise ball, 2 set of 10 reps, bilaterally, pt cued to remain in pain free ROM -3 way kicks, 1 set of 8 reps bilaterally, RTB at ankles, pt cued for proper sequencing -Bird dog, 1 set of 7 reps, bilaterally, pillow under knees for increased pt comfort Therapeutic Activity: -Box lifts from floor, 4 sets of 5 reps, pt cued for upright posture and bend in knees, 10>15>20 lbs of weight in box   05/27/2024  Therapeutic Exercise: -Nustep, 5 minutes, level 7 resistance, pt cued for 70-80 spm -Supine bridges with gtb at knees, 2 sets of 10 reps, 3 second holds, pt cued for max hip extension -Monster walks, 2 laps 20 feet per lap, with RTB around ankles, pt cued for upright posture and athletic stance -Side lying clamshells, 1 set of 15 reps, bilaterally, gtb at knees, pt cued for sequencing -Double knees to chest, 2 sets of 10 reps, on green theraball, pt cued for max pain free ROM and smooth motion Neuromuscular Re-education: -Lower trunk rotations on green exercise ball, 2 set of 10 reps, bilaterally, pt cued to remain in pain free ROM -Paloff press, 2 set of 10 reps, GTB at chest level on web slide, pt cued for  sequencing, second set on blue foam pad -Bird dog, 1 set of 2 reps, bilaterally, pt requests to cease due to balance and knee pain Therapeutic Activity: -Lateral stepping, 2 laps, 20 feet per lap, with RTB around ankles, pt cued for upright posture and slight bend in knees -Sit to stands with 10lb kettle bell with shoulder press, 2 sets of 8 reps, pt cued for core activation and keeping weight close to chest   PATIENT EDUCATION:  Education details: HEP, POC, and expectations for soreness Person educated: Patient Education method: Explanation Education comprehension: verbalized understanding  HOME EXERCISE PROGRAM: Access Code: [email or web address] URL: [email or web address] Date: 05/08/2024 Prepared by: Lacinda Fass  Exercises - Seated Flexion Stretch with Swiss Ball  - 1 x daily - 7 x weekly - 3 sets - 10 reps - Seated Abdominal Press into [employer]  - 1 x daily - 7 x weekly - 3 sets -  10 reps - 5 second hold  ASSESSMENT:  CLINICAL IMPRESSION: Patient demonstrates significant improvements with decreased low back pain, increased core/LE strength, improved functional mobility and balance. Patient also demonstrates fair endurance with aerobic activity on treadmill this date, heavily reliant on UE support. Patient able to progress dynamic balance and core activation exercises today with kettle bell swing and trampoline toss, good performance with verbal cueing. Patient would continue to benefit from skilled physical therapy for increased endurance with ambulation, increased LE/core strength, and improved balance for improved quality of life, improved independence with management of low back and continued progress towards therapy goals.     Eval: Patient is a 64 y.o. female who was seen today for physical therapy evaluation and treatment for left low back and lower extremity pain.  She presented with low pain severity and irritability with palpation and left hamstring manual muscle  testing reproducing her familiar symptoms.  Recommend that she continue with skilled physical therapy to address her impairments to return to her prior level of function.  OBJECTIVE IMPAIRMENTS: decreased activity tolerance, decreased knowledge of condition, decreased mobility, decreased ROM, decreased strength, hypomobility, impaired tone, and pain.   ACTIVITY LIMITATIONS: lifting and locomotion level  PARTICIPATION LIMITATIONS: shopping and community activity  PERSONAL FACTORS: Past/current experiences, Time since onset of injury/illness/exacerbation, and 3+ comorbidities: Hypertension, OA, history of COVID-19, asthma, type 2 diabetes, and hearing deficits are also affecting patient's functional outcome.   REHAB POTENTIAL: Good  CLINICAL DECISION MAKING: Stable/uncomplicated  EVALUATION COMPLEXITY: Low   GOALS: Goals reviewed with patient? Yes  LONG TERM GOALS: Target date: 06/05/24  Patient will be independent with her HEP. Baseline:  Goal status: INITIAL  2.  Patient will be able to complete her daily activities without her familiar symptoms exceeding 5/10. Baseline:  Goal status: INITIAL  3.  Patient will improve her left quadricep strength to at least 4+/5 for improved function with transfers. Baseline:  Goal status: INITIAL  4.  Patient will improve her 5 times sit to stand time to 12 seconds or less to reduce her risk of falling. Baseline:  Goal status: INITIAL  5.  Patient will improve her ODI score to 14/50 or less for improved perceived function with her daily activities. Baseline:  Goal status: INITIAL  PLAN:  PT FREQUENCY: 2x/week  PT DURATION: 4 weeks  PLANNED INTERVENTIONS: 97164- PT Re-evaluation, 97750- Physical Performance Testing, 97110-Therapeutic exercises, 97530- Therapeutic activity, 97112- Neuromuscular re-education, 97535- Self Care, [id number]- Manual therapy, Patient/Family education, Balance training, Stair training, Joint mobilization, Spinal  manipulation, Spinal mobilization, Cryotherapy, and Moist heat.  PLAN FOR NEXT SESSION: Update HEP as needed, manual therapy, lumbar stabilization, and progress lifting mechanics, likely discharge next session, revise HEP   Lang Ada, PT, DPT [facility] Office: [phone] 11:47 AM, 06/12/2024     "

## 2024-06-15 ENCOUNTER — Other Ambulatory Visit: Payer: Self-pay | Admitting: Allergy & Immunology

## 2024-06-15 ENCOUNTER — Other Ambulatory Visit: Payer: Self-pay | Admitting: Family Medicine

## 2024-06-16 ENCOUNTER — Encounter (HOSPITAL_COMMUNITY): Payer: Self-pay

## 2024-06-16 ENCOUNTER — Ambulatory Visit (HOSPITAL_COMMUNITY)

## 2024-06-16 DIAGNOSIS — M6281 Muscle weakness (generalized): Secondary | ICD-10-CM

## 2024-06-16 DIAGNOSIS — M5459 Other low back pain: Secondary | ICD-10-CM

## 2024-06-16 DIAGNOSIS — R262 Difficulty in walking, not elsewhere classified: Secondary | ICD-10-CM

## 2024-06-16 DIAGNOSIS — M7062 Trochanteric bursitis, left hip: Secondary | ICD-10-CM

## 2024-06-16 NOTE — Therapy (Signed)
 " OUTPATIENT PHYSICAL THERAPY THORACOLUMBAR TREATMENT  PHYSICAL THERAPY DISCHARGE SUMMARY  Visits from Start of Care: 5  Current functional level related to goals / functional outcomes: MET except for HEP goal   Remaining deficits: None   Education / Equipment: Importance of HEP compliance and self strategies for management of low back and LLE pain.   Patient agrees to discharge. Patient goals were partially met. Patient is being discharged due to being pleased with the current functional level.   Patient Name: Sandy Valencia MRN: [id number] DOB:Jan 28, 1961, 64 y.o., female Today's Date: 06/16/2024  END OF SESSION:  PT End of Session - 06/16/24 1026     Visit Number 6    Number of Visits 8    Date for Recertification  07/03/24    Authorization Type [employer]    Authorization Time Period 05/08/24-07/06/24    Authorization - Visit Number 5    Authorization - Number of Visits 6    Progress Note Due on Visit 6    PT Start Time 1026    PT Stop Time 1043    PT Time Calculation (min) 17 min    Activity Tolerance Patient tolerated treatment well    Behavior During Therapy [facility] for tasks assessed/performed               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   Anaphylactic shock, unspecified, subsequent encounter    Anemia, iron deficiency 02/14/2015   patient is not aware this dx   Angio-edema    Arthritis    Asthma    Mild intermittent asthma   Asymptomatic varicose veins of right lower extremity    Chronic obstructive pulmonary disease with (acute) exacerbation (HCC)    patient denies this dx   Chronic rhinitis 05/21/2018   COVID-19 2019   Diabetes mellitus    type 2   Essential (primary) hypertension    Family history of colon cancer 09/03/2012   Fatty liver    Gastro-esophageal reflux disease without esophagitis    GERD (gastroesophageal reflux disease)     Hearing loss    wears hearing aid   History of colonic polyps 08/26/2017   Hypercholesteremia    Hypertension    Hypothyroidism    Low back pain    and left leg pain   Mass of finger, right s/p surgical excision (mucoid cyst) 04/17/18 04/22/2018   Mucous cyst of digit of right hand    Obesity, unspecified    Other adverse food reactions, not elsewhere classified, initial encounter    Personal history of noncompliance with medical treatment, presenting hazards to health 05/18/2015   Rash and other nonspecific skin eruption    Restless legs syndrome    patient denies this dx   Sleep apnea    pt siad, i had small amount but could not tolerate CPAP and they said it was ok since it was mild.   Unspecified asthma, uncomplicated    Viral URI 07/13/2022   Zoster without complications    Past Surgical History:  Procedure Laterality Date   ABDOMINAL HYSTERECTOMY     BACK SURGERY  11/26/2022   BIOPSY  11/08/2022   Procedure: BIOPSY;  Surgeon: Shaaron Lamar HERO, MD;  [facility];  Service: Endoscopy;;   CATARACT EXTRACTION W/PHACO Left 03/09/2022   Procedure: CATARACT EXTRACTION PHACO AND INTRAOCULAR LENS PLACEMENT (IOC);  Surgeon: Harrie Agent, MD;  Location: [facility];  Service: Ophthalmology;  Laterality: Left;  CDE 7.89   CATARACT EXTRACTION W/PHACO Right 03/23/2022   Procedure: CATARACT EXTRACTION PHACO AND INTRAOCULAR LENS PLACEMENT (IOC);  Surgeon: Harrie Agent, MD;  Location: [facility];  Service: Ophthalmology;  Laterality: Right;  CDE: 2.63   CHOLECYSTECTOMY     COLONOSCOPY N/A 09/22/2012   RMR: colonic polyps -removed as described above. tubular adenoma, next TCS 09/2017   COLONOSCOPY WITH PROPOFOL  N/A 09/23/2017   Surgeon: Shaaron Lamar HERO, MD; internal hemorrhoids, three 5-6 mm polyps removed, diverticulosis in sigmoid and descending colon.  Pathology with tubular adenomas and rectal hyperplastic polyp.  Recommended 5 year surveillance.   COLONOSCOPY WITH PROPOFOL  N/A  11/08/2022   Procedure: COLONOSCOPY WITH PROPOFOL ;  Surgeon: Shaaron Lamar HERO, MD;  [facility];  Service: Endoscopy;  Laterality: N/A;  815am, asa 3   CYST EXCISION Left 07/19/2016   Procedure: CYST REMOVAL LEFT RING FINGER;  Surgeon: Taft FORBES Minerva, MD;  Location: [facility];  Service: Orthopedics;  Laterality: Left;   CYST REMOVAL LEG Right    foot   ESOPHAGOGASTRODUODENOSCOPY N/A 10/21/2014   RMR: Small hiatal hernia; otherwise normal EGD status post passage of a Maloney dilator.    ESOPHAGOGASTRODUODENOSCOPY (EGD) WITH PROPOFOL  N/A 11/08/2022   Procedure: ESOPHAGOGASTRODUODENOSCOPY (EGD) WITH PROPOFOL ;  Surgeon: Shaaron Lamar HERO, MD;  [facility];  Service: Endoscopy;  Laterality: N/A;   EXCISION MASS UPPER EXTREMETIES Right 04/17/2018   Procedure: EXCISION MASS UPPER EXTREMETIES right ring finger;  Surgeon: Minerva Taft FORBES, MD;  Location: [facility];  Service: Orthopedics;  Laterality: Right;   LUMBAR LAMINECTOMY/DECOMPRESSION MICRODISCECTOMY Bilateral 11/26/2022   Procedure: Microdiscectomy - bilateral - Lumbar four-Lumbar five;  Surgeon: Louis Shove, MD;  Location: [facility];  Service: Neurosurgery;  Laterality: Bilateral;   MALONEY DILATION N/A 10/21/2014   Procedure: AGAPITO DILATION;  Surgeon: Lamar HERO Shaaron, MD;  [facility];  Service: Endoscopy;  Laterality: N/A;   MALONEY DILATION N/A 11/08/2022   Procedure: AGAPITO DILATION;  Surgeon: Shaaron Lamar HERO, MD;  [facility];  Service: Endoscopy;  Laterality: N/A;   MIDDLE EAR SURGERY Right    patched hole in ear drum   POLYPECTOMY  09/23/2017   Procedure: POLYPECTOMY;  Surgeon: Shaaron Lamar HERO, MD;  [facility];  Service: Endoscopy;;  polyp hepatic flexure polyp cs, splenic flexure polyp cs, rectal polyp cs   POLYPECTOMY  11/08/2022   Procedure: POLYPECTOMY;  Surgeon: Shaaron Lamar HERO, MD;  [facility];  Service: Endoscopy;;   TOTAL ABDOMINAL HYSTERECTOMY     Patient  Active Problem List   Diagnosis Date Noted   Acute recurrent maxillary sinusitis 04/09/2024   Subacute cough 03/02/2024   Sensorineural hearing loss, bilateral 10/31/2023   Primary osteoarthritis of right knee 10/17/2023   Acute cystitis without hematuria 06/19/2023   Acute bronchitis 05/24/2023   Chronic mastoiditis of right side 03/24/2023   Varicose veins of right lower extremity 01/22/2023   Acute pain of left lower extremity 10/25/2022   Primary insomnia 09/18/2022   Encounter for examination following treatment at hospital 09/18/2022   Acute recurrent frontal sinusitis 07/20/2022   Lumbar radiculopathy 03/08/2022   Statin intolerance 01/19/2022   Encounter for general adult medical examination with abnormal findings 01/16/2022   Lumbar spondylosis 09/05/2021   Allergic rhinitis due to allergen 04/17/2021   Seborrheic keratosis 04/17/2021   Primary osteoarthritis involving multiple joints 12/02/2019   Anaphylactic shock due to adverse food reaction  11/18/2019   Long-term current use of opiate analgesic 09/08/2019   Neck pain 09/07/2019   Mild intermittent asthma without complication 05/21/2018   Gastroesophageal reflux disease 05/21/2018   History of colonic polyps 08/26/2017   Hypothyroidism 04/22/2017   Mixed hyperlipidemia 12/31/2016   Type 2 diabetes mellitus with hyperglycemia (HCC) 05/18/2015   Essential hypertension 05/18/2015   Morbid obesity (HCC) 05/18/2015   Fatty liver 02/14/2015   Hiatal hernia    Dysphagia 10/11/2014    PCP: Tobie Suzzane POUR, MD  REFERRING PROVIDER: Louis Shove, MD   REFERRING DIAG: Radiculopathy, lumbar region   Rationale for Evaluation and Treatment: Rehabilitation  THERAPY DIAG:  Other low back pain  Muscle weakness (generalized)  Difficulty in walking, not elsewhere classified  Trochanteric bursitis of left hip  ONSET DATE: 02/26/24  SUBJECTIVE:                                                                                                                                                                                            SUBJECTIVE STATEMENT: Pt states injection helped, no back pain this date. Pt states she didn't come Monday due to the injection.   Eval: Patient reports that she began having back and left leg pain that began on 02/26/24 after getting back from a cruise. She has tried pain medication, but this doesn't seem to help. She had pain like this year after getting back from a trip. Her pain has been slowly letting up, but it has not gone away yet.   PERTINENT HISTORY:  Hypertension, OA, history of COVID-19, asthma, type 2 diabetes, and hearing deficits  PAIN:  Are you having pain? Yes: NPRS scale: Current: 0/10 Best: 0/10 Worst: 8/10   Pain location: low back and left leg  Pain description: intermittent, sore, aching pain  Aggravating factors: walking Relieving factors: leaning on carts or tables  PRECAUTIONS: None  RED FLAGS: None   WEIGHT BEARING RESTRICTIONS: No  FALLS:  Has patient fallen in last 6 months? 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  DIAGNOSTIC FINDINGS: 04/16/24 lumbar MRI  IMPRESSION: Relatively stable degenerative spondylosis with mild dextroscoliotic curvature.   There is multilevel disc desiccation facet arthrosis most notably at L3-4 and L4-5 as above. There is relatively stable moderate to severe left  foraminal narrowing at L3-4 secondary to left foraminal disc extrusion. Correlation for left L3 radiculopathy. There is caudal foraminal narrowing bilaterally at  L4-5 secondary to disc osteophyte. Correlation for L4 radiculopathy.   No significant acute interval change. No spinal stenosis is identified.  PATIENT SURVEYS:  Modified Oswestry:  MODIFIED OSWESTRY DISABILITY SCALE  Date: 05/08/24 Score  Pain intensity 4 =  Pain medication provides me with little relief from pain.  2. Personal care (washing, dressing, etc.) 0 =  I can take care of myself normally without causing increased pain.  3. Lifting 4 = I can lift only very light weights  4. Walking 2 =  Pain prevents me from walking more than  mile.  5. Sitting 3 =  Pain prevents me from sitting more than  hour.  6. Standing 4 =  Pain prevents me from standing more than 10 minutes.  7. Sleeping 0 = Pain does not prevent me from sleeping well.  8. Social Life 1 =  My social life is normal, but it increases my level of pain.  9. Traveling 4 = My pain restricts my travel to short necessary journeys under 1/2 hour.  10. Employment/ Homemaking 2 = I can perform most of my homemaking/job duties, but pain prevents me from performing more physically stressful activities (eg, lifting, vacuuming).  Total 24/50   Interpretation of scores: Score Category Description  0-20% Minimal Disability The patient can cope with most living activities. Usually no treatment is indicated apart from advice on lifting, sitting and exercise  21-40% Moderate Disability The patient experiences more pain and difficulty with sitting, lifting and standing. Travel and social life are more difficult and they may be disabled from work. Personal care, sexual activity and sleeping are not grossly affected, and the patient can usually be managed by conservative means  41-60% Severe Disability Pain remains the main problem in this group, but activities of daily living are affected. These patients require a detailed investigation  61-80% Crippled Back pain impinges on all aspects of the patients life. Positive intervention is required   81-100% Bed-bound These patients are either bed-bound or exaggerating their symptoms  Bluford FORBES Zoe DELENA Karon DELENA, et al. Surgery versus conservative management of stable thoracolumbar fracture: the PRESTO feasibility RCT. [place] ([place]): [employer]; 2021 Nov. [facility] Technology Assessment, No. 25.62.) Appendix 3, Oswestry Disability Index category descriptors. Available from: Findjewelers.cz  Minimally Clinically Important Difference (MCID) = 12.8%  COGNITION: Overall cognitive status: Within functional limits for tasks assessed     SENSATION: Patient reports no numbness or tingling  PALPATION: Familiar pain reproduced with palpation to left QL, TFL, and IT band   JOINT MOBILITY:  L1-5: mild hypomobility  LUMBAR ROM:   AROM eval  Flexion WFL  Extension 25% limited  Right lateral flexion WFL; I feel it  Left lateral flexion WFL; I feel it  Right rotation 25% limited  Left rotation 25% limited    (Blank rows = not tested)  LOWER EXTREMITY ROM: WFL for activities assessed  LOWER EXTREMITY MMT:    MMT Right eval Left eval  Hip flexion 4-/5 4-/5  Hip extension    Hip abduction    Hip adduction 5/5 5/5  Hip internal rotation    Hip external rotation    Knee flexion 4/5 4/5; familiar pain  Knee extension 5/5 4/5; I feel it   Ankle dorsiflexion 4+/5 4+/5  Ankle plantarflexion    Ankle inversion    Ankle eversion     (  Blank rows = not tested)  FUNCTIONAL TESTS:  5 times sit to stand: 14.36 seconds 2 minute walk test: 497 feet  GAIT: Distance walked: 497 feet Assistive device utilized: None Level of assistance: Complete Independence Comments: No significant gait deviations observed  TREATMENT DATE:                                                                                                                               06/16/2024  Pt educated on importance of HEP compliance, walking regiment, and independent  strategies for management of low back and LLE pain in the furture. HEP reviewed and goals tracked.   06/12/2024  Therapeutic Exercise: -Treadmill, 3.5 minutes, 0 incline, speed 1.0>1.5 Neuromuscular Re-education: -Bosu ball lunges, 1 set of 8 reps bilaterally, pt cued for decreased ROM to avoid knee pain -Bosu ball squat, 1 set of 8 reps, pt cued for sequencing -Dorsiflexion marches with 10lb kettle bell off 6 inch step, pt cued for decreased UE support -Body craft pallof walkouts, 3 laps bilaterally, 10 steps per lap, plate 2, pt cued for decreased  -Bird dog, 1 set of 10 reps, bilaterally, pillow under knees for increased pt comfort Therapeutic Activity: -Forward lunges on bosu ball, 1 set of 8 reps bilaterally -Kettle bell swing, 3 sets of 8 reps, pt cued for sequencing -Trampoline toss with blue and orange ball, 1 set of 15 with blue and 1 set of 10 reps with orange, standing on blue foam for balance/core stabilization  06/03/2024  Therapeutic Exercise: -Nustep, 5 minutes, level 4-8 resistance, pt cued for 70-80 spm -Supine bridges with green ball, 2 sets of 10 reps, 3 second holds, pt cued for max hip extension -Side lying planks from knees, 1 set of (4 on left, 7 on right) reps, bilaterally, 5 second holds, pt cued for sequencing -Double knees to chest, 2 sets of 10 reps, on green theraball, pt cued for max pain free ROM and smooth motion Neuromuscular Re-education: -Lower trunk rotations on green exercise ball, 2 set of 10 reps, bilaterally, pt cued to remain in pain free ROM -3 way kicks, 1 set of 8 reps bilaterally, RTB at ankles, pt cued for proper sequencing -Bird dog, 1 set of 7 reps, bilaterally, pillow under knees for increased pt comfort Therapeutic Activity: -Box lifts from floor, 4 sets of 5 reps, pt cued for upright posture and bend in knees, 10>15>20 lbs of weight in box  PATIENT EDUCATION:  Education details: HEP, POC, and expectations for soreness Person educated:  Patient Education method: Explanation Education comprehension: verbalized understanding  HOME EXERCISE PROGRAM: Access Code: [email or web address] URL: [email or web address] Date: 05/08/2024 Prepared by: Lacinda Fass  Exercises - Seated Flexion Stretch with Swiss Ball  - 1 x daily - 7 x weekly - 3 sets - 10 reps - Seated Abdominal Press into [employer]  - 1 x daily - 7 x weekly - 3 sets - 10 reps - 5 second hold  ASSESSMENT:  CLINICAL IMPRESSION: Patient continues to demonstrate no back and LLE pain for a couple of visits now. Patient also demonstrates ability to meet all therapy goals except for HEP goal, pt verbally states she will do them. Patient to be discharged from this episode of care this date due to being pleased with current level of function and meeting majority of goals.      Eval: Patient is a 64 y.o. female who was seen today for physical therapy evaluation and treatment for left low back and lower extremity pain.  She presented with low pain severity and irritability with palpation and left hamstring manual muscle testing reproducing her familiar symptoms.  Recommend that she continue with skilled physical therapy to address her impairments to return to her prior level of function.  OBJECTIVE IMPAIRMENTS: decreased activity tolerance, decreased knowledge of condition, decreased mobility, decreased ROM, decreased strength, hypomobility, impaired tone, and pain.   ACTIVITY LIMITATIONS: lifting and locomotion level  PARTICIPATION LIMITATIONS: shopping and community activity  PERSONAL FACTORS: Past/current experiences, Time since onset of injury/illness/exacerbation, and 3+ comorbidities: Hypertension, OA, history of COVID-19, asthma, type 2 diabetes, and hearing deficits are also affecting patient's functional outcome.   REHAB POTENTIAL: Good  CLINICAL DECISION MAKING: Stable/uncomplicated  EVALUATION COMPLEXITY: Low   GOALS: Goals reviewed with patient?  Yes  LONG TERM GOALS: Target date: 06/05/24  Patient will be independent with her HEP. Baseline: 1 time reported on 1/13 Goal status: Progressing  2.  Patient will be able to complete her daily activities without her familiar symptoms exceeding 5/10. Baseline: 0/10 reported Goal status: MET  3.  Patient will improve her left quadricep strength to at least 4+/5 for improved function with transfers. Baseline: 5/5 on 01/13 Goal status: MET  4.  Patient will improve her 5 times sit to stand time to 12 seconds or less to reduce her risk of falling. Baseline: 10.88 seconds Goal status: MET  5.  Patient will improve her ODI score to 14/50 or less for improved perceived function with her daily activities. Baseline:  3 / 50 = 6.0 % Goal status: MET  PLAN:  PT FREQUENCY: 2x/week  PT DURATION: 4 weeks  PLANNED INTERVENTIONS: 97164- PT Re-evaluation, 97750- Physical Performance Testing, 97110-Therapeutic exercises, 97530- Therapeutic activity, 97112- Neuromuscular re-education, 97535- Self Care, 02859- Manual therapy, Patient/Family education, Balance training, Stair training, Joint mobilization, Spinal manipulation, Spinal mobilization, Cryotherapy, and Moist heat.  PLAN FOR NEXT SESSION: Discharged   Lang Ada, PT, DPT [facility] Office: [phone] 10:53 AM, 06/16/2024     "

## 2024-06-18 ENCOUNTER — Emergency Department (HOSPITAL_COMMUNITY)
Admission: EM | Admit: 2024-06-18 | Discharge: 2024-06-18 | Discharge status: Absent without leave | Source: Home / Self Care

## 2024-06-18 ENCOUNTER — Encounter: Payer: Self-pay | Admitting: Internal Medicine

## 2024-06-18 ENCOUNTER — Ambulatory Visit: Admitting: Internal Medicine

## 2024-06-18 VITALS — BP 148/70 | HR 63 | Ht 65.0 in | Wt 225.6 lb

## 2024-06-18 DIAGNOSIS — M5416 Radiculopathy, lumbar region: Secondary | ICD-10-CM | POA: Diagnosis not present

## 2024-06-18 DIAGNOSIS — Z794 Long term (current) use of insulin: Secondary | ICD-10-CM

## 2024-06-18 DIAGNOSIS — I10 Essential (primary) hypertension: Secondary | ICD-10-CM

## 2024-06-18 DIAGNOSIS — E1165 Type 2 diabetes mellitus with hyperglycemia: Secondary | ICD-10-CM | POA: Diagnosis not present

## 2024-06-18 DIAGNOSIS — E782 Mixed hyperlipidemia: Secondary | ICD-10-CM | POA: Diagnosis not present

## 2024-06-18 MED ORDER — AMLODIPINE BESYLATE 5 MG PO TABS: 5.0000 mg | ORAL_TABLET | Freq: Every day | ORAL | 1 refills | Status: AC

## 2024-06-18 NOTE — Assessment & Plan Note (Addendum)
 BP Readings from Last 1 Encounters:  06/18/24 (!) 148/70   Uncontrolled with olmesartan  40 mg QD Added amlodipine  5 mg QD Discontinued lisinopril  due to her chronic cough Counseled for compliance with the medications Advised DASH diet and moderate exercise/walking, at least 150 mins/week

## 2024-06-18 NOTE — Assessment & Plan Note (Signed)
 Last lipid profile reviewed Simvastatin  10 mg QD - check CMP and lipid profile

## 2024-06-18 NOTE — Assessment & Plan Note (Signed)
 On Lyrica  and Norco PRN Increased dose of Lyrica   to 100 mg twice daily due to radicular pain and leg weakness in the last visit Has completed PT recently S/p microdiscectomy L4-5 (06/24) Followed by spine surgery and pain clinic Flexeril  as needed for muscle spasms

## 2024-06-18 NOTE — Progress Notes (Signed)
 "  Established Patient Office Visit  Subjective:  Patient ID: Sandy Valencia, female    DOB: [birth year]  Age: 64 y.o. MRN: [id number]  CC:  Chief Complaint  Patient presents with   Hypertension    4 month f/u    Diabetes    4 month f/u     HPI Sandy Valencia is a 64 y.o. female with past medical history of HTN, DM, hypothyroidism, NAFLD, GERD, chronic pain syndrome and morbid obesity who presents for follow up of her chronic medical conditions.  HTN: Her BP was elevated today. She is on olmesartan  40 mg QD. She denies any headache, dizziness, chest pain, dyspnea or palpitations.  Type II DM: Followed by endocrinology. Last HbA1c was 9.6 in 10/25. She has been taking Ozempic  0.5 mg QW, did not tolerate 1 mg dose and compliance is questionable. She had stopped it in the past during physical therapy.  She takes Lantus  70 units nightly and takes NovoLog  ISS in addition to metformin .  Today, she reports that she skips doses of Lantus  at times when her blood glucose is around 100. Her blood glucose has been ranging around 150-200 most of the time, with postmeal readings above 200. She had Dexcom in the past, but has coverage issues now.  Lumbar radiculopathy: She had L4-5 microdiscectomy in 06/24. She is currently taking Norco as needed for severe pain and Lyrica  for radicular symptoms. She has felt improvement in LE strength since increasing dose of Lyrica  to 100 mg BID.  She recently completed PT and had steroid injection by Dr. Darlis.  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   Anaphylactic shock, unspecified, subsequent encounter    Anemia, iron deficiency 02/14/2015   patient is not aware this dx   Angio-edema    Arthritis    Asthma    Mild intermittent asthma   Asymptomatic varicose veins of right lower extremity    Chronic obstructive pulmonary disease with (acute) exacerbation (HCC)     patient denies this dx   Chronic rhinitis 05/21/2018   COVID-19 2019   Diabetes mellitus    type 2   Essential (primary) hypertension    Family history of colon cancer 09/03/2012   Fatty liver    Gastro-esophageal reflux disease without esophagitis    GERD (gastroesophageal reflux disease)    Hearing loss    wears hearing aid   History of colonic polyps 08/26/2017   Hypercholesteremia    Hypertension    Hypothyroidism    Low back pain    and left leg pain   Mass of finger, right s/p surgical excision (mucoid cyst) 04/17/18 04/22/2018   Mucous cyst of digit of right hand    Obesity, unspecified    Other adverse food reactions, not elsewhere classified, initial encounter    Personal history of noncompliance with medical treatment, presenting hazards to health 05/18/2015   Rash and other nonspecific skin eruption    Restless legs syndrome    patient denies this dx   Sleep apnea    pt siad, i had small amount but could not tolerate CPAP and they said it was ok since it was mild.   Unspecified asthma, uncomplicated    Viral URI 07/13/2022   Zoster without complications     Past Surgical History:  Procedure Laterality Date   ABDOMINAL HYSTERECTOMY     BACK SURGERY  11/26/2022  BIOPSY  11/08/2022   Procedure: BIOPSY;  Surgeon: Shaaron Lamar HERO, MD;  Location: [facility];  Service: Endoscopy;;   CATARACT EXTRACTION W/PHACO Left 03/09/2022   Procedure: CATARACT EXTRACTION PHACO AND INTRAOCULAR LENS PLACEMENT (IOC);  Surgeon: Harrie Agent, MD;  Location: [facility];  Service: Ophthalmology;  Laterality: Left;  CDE 7.89   CATARACT EXTRACTION W/PHACO Right 03/23/2022   Procedure: CATARACT EXTRACTION PHACO AND INTRAOCULAR LENS PLACEMENT (IOC);  Surgeon: Harrie Agent, MD;  Location: [facility];  Service: Ophthalmology;  Laterality: Right;  CDE: 2.63   CHOLECYSTECTOMY     COLONOSCOPY N/A 09/22/2012   RMR: colonic polyps -removed as described above. tubular adenoma, next TCS 09/2017    COLONOSCOPY WITH PROPOFOL  N/A 09/23/2017   Surgeon: Shaaron Lamar HERO, MD; internal hemorrhoids, three 5-6 mm polyps removed, diverticulosis in sigmoid and descending colon.  Pathology with tubular adenomas and rectal hyperplastic polyp.  Recommended 5 year surveillance.   COLONOSCOPY WITH PROPOFOL  N/A 11/08/2022   Procedure: COLONOSCOPY WITH PROPOFOL ;  Surgeon: Shaaron Lamar HERO, MD;  Location: [facility];  Service: Endoscopy;  Laterality: N/A;  815am, asa 3   CYST EXCISION Left 07/19/2016   Procedure: CYST REMOVAL LEFT RING FINGER;  Surgeon: Taft FORBES Minerva, MD;  Location: [facility];  Service: Orthopedics;  Laterality: Left;   CYST REMOVAL LEG Right    foot   ESOPHAGOGASTRODUODENOSCOPY N/A 10/21/2014   RMR: Small hiatal hernia; otherwise normal EGD status post passage of a Maloney dilator.    ESOPHAGOGASTRODUODENOSCOPY (EGD) WITH PROPOFOL  N/A 11/08/2022   Procedure: ESOPHAGOGASTRODUODENOSCOPY (EGD) WITH PROPOFOL ;  Surgeon: Shaaron Lamar HERO, MD;  [facility]: [facility];  Service: Endoscopy;  Laterality: N/A;   EXCISION MASS UPPER EXTREMETIES Right 04/17/2018   Procedure: EXCISION MASS UPPER EXTREMETIES right ring finger;  Surgeon: Minerva Taft FORBES, MD;  Location: [facility];  Service: Orthopedics;  Laterality: Right;   LUMBAR LAMINECTOMY/DECOMPRESSION MICRODISCECTOMY Bilateral 11/26/2022   Procedure: Microdiscectomy - bilateral - Lumbar four-Lumbar five;  Surgeon: Louis Shove, MD;  Location: [facility];  Service: Neurosurgery;  Laterality: Bilateral;   MALONEY DILATION N/A 10/21/2014   Procedure: AGAPITO DILATION;  Surgeon: Lamar HERO Shaaron, MD;  [facility]: [facility];  Service: Endoscopy;  Laterality: N/A;   MALONEY DILATION N/A 11/08/2022   Procedure: AGAPITO DILATION;  Surgeon: Shaaron Lamar HERO, MD;  [facility]: [facility];  Service: Endoscopy;  Laterality: N/A;   MIDDLE EAR SURGERY Right    patched hole in ear drum   POLYPECTOMY  09/23/2017   Procedure: POLYPECTOMY;  Surgeon: Shaaron Lamar HERO,  MD;  [facility]: [facility];  Service: Endoscopy;;  polyp hepatic flexure polyp cs, splenic flexure polyp cs, rectal polyp cs   POLYPECTOMY  11/08/2022   Procedure: POLYPECTOMY;  Surgeon: Shaaron Lamar HERO, MD;  Location: [facility];  Service: Endoscopy;;   TOTAL ABDOMINAL HYSTERECTOMY      Family History  Problem Relation Age of Onset   Colon cancer Mother        diagnosed age 56, underwent surgical resection, metastatic disease several years later   Diabetes Mother    Heart attack Father    Diabetes Sister    Hypothyroidism Brother    Diabetes Sister    Diabetes Sister    Diabetes Sister    Hypothyroidism Sister    Allergic rhinitis Neg Hx    Angioedema Neg Hx    Asthma Neg Hx    Atopy Neg Hx    Eczema Neg Hx    Immunodeficiency Neg  Hx    Urticaria Neg Hx     Social History   Socioeconomic History   Marital status: Divorced    Spouse name: Not on file   Number of children: 2   Years of education: Not on file   Highest education level: Not on file  Occupational History   Not on file  Tobacco Use   Smoking status: Never   Smokeless tobacco: Never  Vaping Use   Vaping status: Never Used  Substance and Sexual Activity   Alcohol use: No    Alcohol/week: 0.0 standard drinks of alcohol   Drug use: No   Sexual activity: Not Currently    Birth control/protection: Surgical    Comment: Hysterectomy  Other Topics Concern   Not on file  Social History Narrative   Lives with grandson (since born)      Enjoys: vacation, travel, concerts      Diet: eats all food groups    Caffeine: diet Pepsi daily    Water :  8 cups daily       Wears seat belt    Does use phone   Smoke detectors    Weapons in safe area    Social Drivers of Health   Tobacco Use: Low Risk (06/18/2024)   Patient History    Smoking Tobacco Use: Never    Smokeless Tobacco Use: Never    Passive Exposure: Not on file  Financial Resource Strain: Not on file  Food Insecurity: Not on file   Transportation Needs: Not on file  Physical Activity: Not on file  Stress: Not on file  Social Connections: Not on file  Intimate Partner Violence: Not on file  Depression (PHQ2-9): Low Risk (06/18/2024)   Depression (PHQ2-9)    PHQ-2 Score: 0  Alcohol Screen: Not on file  Housing: Not on file  Utilities: Not on file  Health Literacy: Not on file    Outpatient Medications Prior to Visit  Medication Sig Dispense Refill   Accu-Chek FastClix Lancets MISC TEST FOUR TIMES DAILY 102 each 0   ACCU-CHEK GUIDE TEST test strip USE TO TEST BLOOD GLUCOSE FOUR TIMES DAILY. 100 strip 0   aspirin EC 81 MG tablet Take 81 mg by mouth daily.     cetirizine  (ZYRTEC ) 10 MG tablet TAKE ONE TABLET (10MG  TOTAL) BY MOUTH DAILY 30 tablet 5   Cholecalciferol  (VITAMIN D -3) 1000 UNITS CAPS Take 1,000 Units by mouth daily.      cyclobenzaprine  (FLEXERIL ) 10 MG tablet TAKE ONE TABLET (10MG  TOTAL) BY MOUTH THREE TIMES DAILY AS NEEDED FOR MUSCLE SPASMS 30 tablet 0   diclofenac  Sodium (VOLTAREN ) 1 % GEL APPLY 2 GRAMS TO AFFECTED AREAS 2 TIMES A DAY AS NEEDED 100 g 0   EPINEPHrine  0.3 mg/0.3 mL IJ SOAJ injection Inject 0.3 mg into the muscle as needed for anaphylaxis. 2 each 1   fluticasone  (FLONASE ) 50 MCG/ACT nasal spray USE TWO SPRAYS IN BOTH NOSTRILS DAILY ASNEEDED 16 g 3   HYDROcodone -acetaminophen  (NORCO) 7.5-325 MG tablet Take 1 tablet by mouth 4 (four) times daily as needed.     insulin  aspart (NOVOLOG  FLEXPEN) 100 UNIT/ML FlexPen Inject 20-26 Units into the skin 3 (three) times daily with meals. 60 mL 3   ketoconazole  (NIZORAL ) 2 % cream APPLY TO AFFECTED AREAS TWICE DAILY 60 g 2   LANTUS  SOLOSTAR 100 UNIT/ML Solostar Pen Inject 80 Units into the skin at bedtime. 72 mL 3   levothyroxine  (SYNTHROID ) 75 MCG tablet TAKE ONE (1) TABLET BY MOUTH  EVERY DAY BEFORE BREAKFAST 90 tablet 3   metFORMIN  (GLUCOPHAGE -XR) 500 MG 24 hr tablet Take 1 tablet (500 mg total) by mouth 2 (two) times daily with a meal. 180 tablet 3    olmesartan  (BENICAR ) 40 MG tablet TAKE ONE TABLET (40MG  TOTAL) BY MOUTH DAILY 30 tablet 3   pantoprazole  (PROTONIX ) 40 MG tablet TAKE ONE TABLET (40MG  TOTAL) BY MOUTH DAILY 30 tablet 11   pregabalin  (LYRICA ) 100 MG capsule TAKE ONE CAPSULE (100MG  TOTAL) BY MOUTH TWO TIMES DAILY 60 capsule 5   Semaglutide ,0.25 or 0.5MG /DOS, (OZEMPIC , 0.25 OR 0.5 MG/DOSE,) 2 MG/3ML SOPN Inject 0.5 mg into the skin once a week. 3 mL 3   simvastatin  (ZOCOR ) 10 MG tablet TAKE ONE TABLET (10MG  TOTAL) BY MOUTH DAILY AT 6PM 90 tablet 1   SURE COMFORT PEN NEEDLES 31G X 8 MM MISC USE FOUR TIMES DAILY 100 each 2   VENTOLIN  HFA 108 (90 Base) MCG/ACT inhaler INHALE 4 PUFFS INTO THE LUNGS EVERY 6 HOURS AS NEEDED FOR WHEEZING OR SHORTNESS OF BREATH 18 g 1   vitamin B-12 (CYANOCOBALAMIN ) 500 MCG tablet Take 500 mcg by mouth daily.     azithromycin  (ZITHROMAX  Z-PAK) 250 MG tablet As per instructions 6 each 0   No facility-administered medications prior to visit.    Allergies  Allergen Reactions   Kiwi Extract Anaphylaxis, Swelling and Palpitations   Other Anaphylaxis    Walnuts (avoids all tree nuts)   Statins     Transaminase elevation    ROS Review of Systems  Constitutional:  Negative for chills and fever.  HENT:  Negative for congestion, postnasal drip and sinus pressure.   Eyes:  Negative for pain and discharge.  Respiratory:  Negative for cough and shortness of breath.   Cardiovascular:  Negative for chest pain and palpitations.  Gastrointestinal:  Negative for abdominal pain, diarrhea, nausea and vomiting.  Endocrine: Negative for polydipsia and polyuria.  Genitourinary:  Negative for dysuria and hematuria.  Musculoskeletal:  Positive for arthralgias and back pain. Negative for neck pain and neck stiffness.       Bilateral thigh pain  Skin:  Positive for color change (Mole over back).  Neurological:  Negative for dizziness and weakness.  Psychiatric/Behavioral:  Positive for sleep disturbance. Negative  for agitation and behavioral problems.       Objective:    Physical Exam Vitals reviewed.  Constitutional:      General: She is not in acute distress.    Appearance: She is obese. She is not diaphoretic.  HENT:     Head: Normocephalic and atraumatic.     Nose: No congestion.     Mouth/Throat:     Mouth: Mucous membranes are moist.     Pharynx: No posterior oropharyngeal erythema.  Eyes:     General: No scleral icterus.    Extraocular Movements: Extraocular movements intact.  Cardiovascular:     Rate and Rhythm: Normal rate and regular rhythm.     Heart sounds: Normal heart sounds. No murmur heard. Pulmonary:     Breath sounds: Normal breath sounds. No wheezing or rales.  Musculoskeletal:     Cervical back: Neck supple. No tenderness.     Lumbar back: Tenderness present. Positive left straight leg raise test.     Right lower leg: No edema.     Left lower leg: No edema.  Skin:    General: Skin is warm.  Neurological:     General: No focal deficit present.  Mental Status: She is alert and oriented to person, place, and time.     Motor: Weakness (B/l LE - 4/5) present.  Psychiatric:        Mood and Affect: Mood normal.        Behavior: Behavior normal.     BP (!) 148/70 (BP Location: Left Arm)   Pulse 63   Ht 5' 5 (1.651 m)   Wt 225 lb 9.6 oz (102.3 kg)   SpO2 98%   BMI 37.54 kg/m  Wt Readings from Last 3 Encounters:  06/18/24 225 lb 9.6 oz (102.3 kg)  04/02/24 231 lb 9.6 oz (105.1 kg)  03/02/24 226 lb 0.6 oz (102.5 kg)    Lab Results  Component Value Date   TSH 1.030 03/31/2024   Lab Results  Component Value Date   WBC 5.2 03/31/2024   HGB 11.9 03/31/2024   HCT 36.7 03/31/2024   MCV 88 03/31/2024   PLT 250 03/31/2024   Lab Results  Component Value Date   NA 144 03/31/2024   NA 143 03/31/2024   K 4.1 03/31/2024   K 4.1 03/31/2024   CO2 23 03/31/2024   CO2 24 03/31/2024   GLUCOSE 97 03/31/2024   GLUCOSE 94 03/31/2024   BUN 13 03/31/2024    BUN 13 03/31/2024   CREATININE 0.84 03/31/2024   CREATININE 0.85 03/31/2024   BILITOT 0.2 03/31/2024   BILITOT 0.2 03/31/2024   ALKPHOS 79 03/31/2024   ALKPHOS 76 03/31/2024   AST 26 03/31/2024   AST 21 03/31/2024   ALT 31 03/31/2024   ALT 28 03/31/2024   PROT 6.6 03/31/2024   PROT 6.7 03/31/2024   ALBUMIN 4.4 03/31/2024   ALBUMIN 4.3 03/31/2024   CALCIUM 9.6 03/31/2024   CALCIUM 9.8 03/31/2024   ANIONGAP 11 06/01/2023   EGFR 79 03/31/2024   EGFR 77 03/31/2024   Lab Results  Component Value Date   CHOL 137 03/31/2024   Lab Results  Component Value Date   HDL 55 03/31/2024   Lab Results  Component Value Date   LDLCALC 69 03/31/2024   Lab Results  Component Value Date   TRIG 63 03/31/2024   Lab Results  Component Value Date   CHOLHDL 2.5 03/31/2024   Lab Results  Component Value Date   HGBA1C 9.6 (A) 04/02/2024      Assessment & Plan:   Problem List Items Addressed This Visit       Cardiovascular and Mediastinum   Essential hypertension   BP Readings from Last 1 Encounters:  06/18/24 (!) 148/70   Uncontrolled with olmesartan  40 mg QD Added amlodipine  5 mg QD Discontinued lisinopril  due to her chronic cough Counseled for compliance with the medications Advised DASH diet and moderate exercise/walking, at least 150 mins/week      Relevant Medications   amLODipine  (NORVASC ) 5 MG tablet   Other Relevant Orders   CBC   TSH     Endocrine   Type 2 diabetes mellitus with hyperglycemia (HCC)   Lab Results  Component Value Date   HGBA1C 9.6 (A) 04/02/2024   Uncontrolled, had stopped Ozempic  in between, has resumed since last HbA1c check Follows up with Endocrinology On Lantus  70 units and NovoLog  ISS - advised to take Lantus  in AM as she is afraid to take it at nighttime and skips doses Continue metformin  and Ozempic , needs to stay compliant to Ozempic  On statin Takes Lyrica  for neuropathy/low back pain - 100 mg BID due to worsening of radicular  pain      Relevant Orders   CMP14+EGFR     Nervous and Auditory   Lumbar radiculopathy - Primary   On Lyrica  and Norco PRN Increased dose of Lyrica   to 100 mg twice daily due to radicular pain and leg weakness in the last visit Has completed PT recently S/p microdiscectomy L4-5 (06/24) Followed by spine surgery and pain clinic Flexeril  as needed for muscle spasms        Other   Mixed hyperlipidemia   Last lipid profile reviewed Simvastatin  10 mg QD - check CMP and lipid profile      Relevant Medications   amLODipine  (NORVASC ) 5 MG tablet   Other Relevant Orders   Lipid Profile      Meds ordered this encounter  Medications   amLODipine  (NORVASC ) 5 MG tablet    Sig: Take 1 tablet (5 mg total) by mouth daily.    Dispense:  90 tablet    Refill:  1    Follow-up: Return in about 4 months (around 10/16/2024) for HTN and DM.    Suzzane MARLA Blanch, MD "

## 2024-06-18 NOTE — Patient Instructions (Signed)
 Please start taking Amlodipine  5 mg in addition to Olmesartan  40 mg once daily.  Please continue to take medications as prescribed.  Please continue to follow low carb diet and perform moderate exercise/walking at least 150 mins/week.  Please get fasting blood tests done before the next visit.

## 2024-06-18 NOTE — Assessment & Plan Note (Addendum)
 Lab Results  Component Value Date   HGBA1C 9.6 (A) 04/02/2024   Uncontrolled, had stopped Ozempic  in between, has resumed since last HbA1c check Follows up with Endocrinology On Lantus  70 units and NovoLog  ISS - advised to take Lantus  in AM as she is afraid to take it at nighttime and skips doses Continue metformin  and Ozempic , needs to stay compliant to Ozempic  On statin Takes Lyrica  for neuropathy/low back pain - 100 mg BID due to worsening of radicular pain

## 2024-06-19 ENCOUNTER — Ambulatory Visit (HOSPITAL_COMMUNITY)

## 2024-06-22 ENCOUNTER — Encounter: Payer: Self-pay | Admitting: Podiatry

## 2024-06-22 ENCOUNTER — Ambulatory Visit: Admitting: Podiatry

## 2024-06-22 DIAGNOSIS — M79675 Pain in left toe(s): Secondary | ICD-10-CM

## 2024-06-22 DIAGNOSIS — E1165 Type 2 diabetes mellitus with hyperglycemia: Secondary | ICD-10-CM

## 2024-06-22 DIAGNOSIS — B351 Tinea unguium: Secondary | ICD-10-CM | POA: Diagnosis not present

## 2024-06-22 DIAGNOSIS — Z794 Long term (current) use of insulin: Secondary | ICD-10-CM

## 2024-06-22 DIAGNOSIS — M79674 Pain in right toe(s): Secondary | ICD-10-CM

## 2024-06-22 NOTE — Progress Notes (Signed)

## 2024-06-26 ENCOUNTER — Ambulatory Visit (HOSPITAL_COMMUNITY)

## 2024-07-09 ENCOUNTER — Ambulatory Visit: Admitting: Nurse Practitioner

## 2024-07-09 DIAGNOSIS — Z794 Long term (current) use of insulin: Secondary | ICD-10-CM

## 2024-07-09 DIAGNOSIS — E039 Hypothyroidism, unspecified: Secondary | ICD-10-CM

## 2024-07-09 DIAGNOSIS — E782 Mixed hyperlipidemia: Secondary | ICD-10-CM

## 2024-07-09 DIAGNOSIS — E559 Vitamin D deficiency, unspecified: Secondary | ICD-10-CM

## 2024-07-09 DIAGNOSIS — Z7985 Long-term (current) use of injectable non-insulin antidiabetic drugs: Secondary | ICD-10-CM

## 2024-07-09 DIAGNOSIS — Z7984 Long term (current) use of oral hypoglycemic drugs: Secondary | ICD-10-CM

## 2024-07-09 DIAGNOSIS — I10 Essential (primary) hypertension: Secondary | ICD-10-CM

## 2024-07-31 ENCOUNTER — Ambulatory Visit: Admitting: Nurse Practitioner

## 2024-09-16 ENCOUNTER — Ambulatory Visit: Admitting: Allergy & Immunology

## 2024-09-23 ENCOUNTER — Ambulatory Visit: Admitting: Podiatry

## 2024-09-28 ENCOUNTER — Ambulatory Visit (INDEPENDENT_AMBULATORY_CARE_PROVIDER_SITE_OTHER): Admitting: Otolaryngology

## 2024-10-22 ENCOUNTER — Ambulatory Visit: Payer: Self-pay | Admitting: Internal Medicine
